# Patient Record
Sex: Male | Born: 1955 | Race: White | Hispanic: No | Marital: Married | Smoking: Former smoker
Health system: Southern US, Community
[De-identification: ages and names within clinical notes are randomized; demographics above are authoritative.]

## PROBLEM LIST (undated history)

## (undated) DIAGNOSIS — I255 Ischemic cardiomyopathy: Secondary | ICD-10-CM

## (undated) DIAGNOSIS — I071 Rheumatic tricuspid insufficiency: Secondary | ICD-10-CM

## (undated) DIAGNOSIS — I2510 Atherosclerotic heart disease of native coronary artery without angina pectoris: Secondary | ICD-10-CM

## (undated) DIAGNOSIS — N200 Calculus of kidney: Secondary | ICD-10-CM

## (undated) DIAGNOSIS — R361 Hematospermia: Secondary | ICD-10-CM

## (undated) DIAGNOSIS — A879 Viral meningitis, unspecified: Secondary | ICD-10-CM

## (undated) DIAGNOSIS — I10 Essential (primary) hypertension: Secondary | ICD-10-CM

## (undated) DIAGNOSIS — I509 Heart failure, unspecified: Secondary | ICD-10-CM

## (undated) DIAGNOSIS — I340 Nonrheumatic mitral (valve) insufficiency: Secondary | ICD-10-CM

## (undated) DIAGNOSIS — I739 Peripheral vascular disease, unspecified: Secondary | ICD-10-CM

## (undated) DIAGNOSIS — I5022 Chronic systolic (congestive) heart failure: Secondary | ICD-10-CM

## (undated) HISTORY — DX: Hematospermia: R36.1

## (undated) HISTORY — DX: Calculus of kidney: N20.0

## (undated) HISTORY — DX: Heart failure, unspecified: I50.9

## (undated) HISTORY — DX: Viral meningitis, unspecified: A87.9

## (undated) HISTORY — DX: Essential (primary) hypertension: I10

---

## 2000-04-10 ENCOUNTER — Encounter: Payer: Self-pay | Admitting: Family Medicine

## 2002-04-07 ENCOUNTER — Emergency Department (HOSPITAL_COMMUNITY): Admission: EM | Admit: 2002-04-07 | Discharge: 2002-04-07 | Payer: Self-pay | Admitting: *Deleted

## 2005-01-16 ENCOUNTER — Ambulatory Visit: Payer: Self-pay | Admitting: Family Medicine

## 2007-02-07 DIAGNOSIS — N200 Calculus of kidney: Secondary | ICD-10-CM

## 2007-02-07 HISTORY — DX: Calculus of kidney: N20.0

## 2007-02-22 DIAGNOSIS — I10 Essential (primary) hypertension: Secondary | ICD-10-CM

## 2007-02-22 DIAGNOSIS — E119 Type 2 diabetes mellitus without complications: Secondary | ICD-10-CM

## 2007-03-22 ENCOUNTER — Ambulatory Visit: Payer: Self-pay | Admitting: Family Medicine

## 2007-03-22 DIAGNOSIS — Z87442 Personal history of urinary calculi: Secondary | ICD-10-CM | POA: Insufficient documentation

## 2007-03-22 LAB — CONVERTED CEMR LAB: Glucose, Bld: 111 mg/dL

## 2007-03-26 LAB — CONVERTED CEMR LAB
ALT: 58 units/L — ABNORMAL HIGH (ref 0–53)
AST: 45 units/L — ABNORMAL HIGH (ref 0–37)
Albumin: 4.2 g/dL (ref 3.5–5.2)
Alkaline Phosphatase: 79 units/L (ref 39–117)
BUN: 10 mg/dL (ref 6–23)
Basophils Absolute: 0 10*3/uL (ref 0.0–0.1)
Basophils Relative: 0.2 % (ref 0.0–1.0)
Bilirubin, Direct: 0.3 mg/dL (ref 0.0–0.3)
CO2: 30 meq/L (ref 19–32)
Calcium: 9.7 mg/dL (ref 8.4–10.5)
Chloride: 103 meq/L (ref 96–112)
Cholesterol: 162 mg/dL (ref 0–200)
Creatinine, Ser: 0.7 mg/dL (ref 0.4–1.5)
Eosinophils Absolute: 0.3 10*3/uL (ref 0.0–0.6)
Eosinophils Relative: 4.6 % (ref 0.0–5.0)
GFR calc Af Amer: 154 mL/min
GFR calc non Af Amer: 127 mL/min
Glucose, Bld: 117 mg/dL — ABNORMAL HIGH (ref 70–99)
HCT: 40.9 % (ref 39.0–52.0)
HDL: 34.5 mg/dL — ABNORMAL LOW (ref >39.0)
Hemoglobin: 14.3 g/dL (ref 13.0–17.0)
Hgb A1c MFr Bld: 9 % — ABNORMAL HIGH (ref 4.6–6.0)
LDL Cholesterol: 97 mg/dL (ref 0–99)
Lymphocytes Relative: 31.8 % (ref 12.0–46.0)
MCHC: 34.9 g/dL (ref 30.0–36.0)
MCV: 92.8 fL (ref 78.0–100.0)
Monocytes Absolute: 0.5 10*3/uL (ref 0.2–0.7)
Monocytes Relative: 7 % (ref 3.0–11.0)
Neutro Abs: 3.7 10*3/uL (ref 1.4–7.7)
Neutrophils Relative %: 56.4 % (ref 43.0–77.0)
Platelets: 136 10*3/uL — ABNORMAL LOW (ref 150–400)
Potassium: 4.4 meq/L (ref 3.5–5.1)
RBC: 4.4 M/uL (ref 4.22–5.81)
RDW: 13.4 % (ref 11.5–14.6)
Sodium: 139 meq/L (ref 135–145)
TSH: 3.68 microintl units/mL (ref 0.35–5.50)
Total Bilirubin: 1.7 mg/dL — ABNORMAL HIGH (ref 0.3–1.2)
Total CHOL/HDL Ratio: 4.7
Total Protein: 7.5 g/dL (ref 6.0–8.3)
Triglycerides: 152 mg/dL — ABNORMAL HIGH (ref 0–149)
VLDL: 30 mg/dL (ref 0–40)
WBC: 6.6 10*3/uL (ref 4.5–10.5)

## 2007-05-10 ENCOUNTER — Telehealth: Payer: Self-pay | Admitting: Family Medicine

## 2008-07-07 ENCOUNTER — Telehealth: Payer: Self-pay | Admitting: Family Medicine

## 2008-07-21 ENCOUNTER — Ambulatory Visit: Payer: Self-pay | Admitting: Family Medicine

## 2008-07-21 LAB — CONVERTED CEMR LAB
Bilirubin Urine: NEGATIVE
Blood in Urine, dipstick: NEGATIVE
Ketones, urine, test strip: NEGATIVE
Nitrite: NEGATIVE
Protein, U semiquant: NEGATIVE
Specific Gravity, Urine: 1.025
Urobilinogen, UA: 0.2
WBC Urine, dipstick: NEGATIVE
pH: 5

## 2008-07-27 LAB — CONVERTED CEMR LAB
ALT: 44 units/L (ref 0–53)
AST: 36 units/L (ref 0–37)
Albumin: 3.8 g/dL (ref 3.5–5.2)
Alkaline Phosphatase: 65 units/L (ref 39–117)
BUN: 13 mg/dL (ref 6–23)
Basophils Absolute: 0.1 10*3/uL (ref 0.0–0.1)
Basophils Relative: 0.9 % (ref 0.0–3.0)
Bilirubin, Direct: 0.2 mg/dL (ref 0.0–0.3)
CO2: 29 meq/L (ref 19–32)
Calcium: 9.5 mg/dL (ref 8.4–10.5)
Chloride: 110 meq/L (ref 96–112)
Cholesterol: 132 mg/dL (ref 0–200)
Creatinine, Ser: 0.7 mg/dL (ref 0.4–1.5)
Creatinine,U: 156.5 mg/dL
Eosinophils Absolute: 0.3 10*3/uL (ref 0.0–0.7)
Eosinophils Relative: 4.3 % (ref 0.0–5.0)
GFR calc Af Amer: 154 mL/min
GFR calc non Af Amer: 127 mL/min
Glucose, Bld: 180 mg/dL — ABNORMAL HIGH (ref 70–99)
HCT: 42.5 % (ref 39.0–52.0)
HDL: 36.5 mg/dL — ABNORMAL LOW (ref >39.0)
Hemoglobin: 14.7 g/dL (ref 13.0–17.0)
Hgb A1c MFr Bld: 9.7 % — ABNORMAL HIGH (ref 4.6–6.0)
LDL Cholesterol: 72 mg/dL (ref 0–99)
Lymphocytes Relative: 29.7 % (ref 12.0–46.0)
MCHC: 34.6 g/dL (ref 30.0–36.0)
MCV: 93.5 fL (ref 78.0–100.0)
Microalb Creat Ratio: 14.1 mg/g (ref 0.0–30.0)
Microalb, Ur: 2.2 mg/dL — ABNORMAL HIGH (ref 0.0–1.9)
Monocytes Absolute: 0.5 10*3/uL (ref 0.1–1.0)
Monocytes Relative: 7.7 % (ref 3.0–12.0)
Neutro Abs: 3.3 10*3/uL (ref 1.4–7.7)
Neutrophils Relative %: 57.4 % (ref 43.0–77.0)
PSA: 0.42 ng/mL (ref 0.10–4.00)
Platelets: 122 10*3/uL — ABNORMAL LOW (ref 150–400)
Potassium: 4.8 meq/L (ref 3.5–5.1)
RBC: 4.55 M/uL (ref 4.22–5.81)
RDW: 13.3 % (ref 11.5–14.6)
Sodium: 144 meq/L (ref 135–145)
TSH: 3.33 microintl units/mL (ref 0.35–5.50)
Total Bilirubin: 1.4 mg/dL — ABNORMAL HIGH (ref 0.3–1.2)
Total CHOL/HDL Ratio: 3.6
Total Protein: 7 g/dL (ref 6.0–8.3)
Triglycerides: 117 mg/dL (ref 0–149)
VLDL: 23 mg/dL (ref 0–40)
WBC: 6 10*3/uL (ref 4.5–10.5)

## 2008-08-04 ENCOUNTER — Ambulatory Visit: Payer: Self-pay | Admitting: Family Medicine

## 2008-10-26 ENCOUNTER — Ambulatory Visit: Payer: Self-pay | Admitting: Family Medicine

## 2008-10-28 LAB — CONVERTED CEMR LAB: Hgb A1c MFr Bld: 5.7 % (ref 4.6–6.5)

## 2008-11-02 ENCOUNTER — Ambulatory Visit: Payer: Self-pay | Admitting: Family Medicine

## 2008-11-09 ENCOUNTER — Telehealth: Payer: Self-pay | Admitting: Family Medicine

## 2008-11-17 ENCOUNTER — Telehealth (INDEPENDENT_AMBULATORY_CARE_PROVIDER_SITE_OTHER): Payer: Self-pay | Admitting: *Deleted

## 2009-06-21 ENCOUNTER — Telehealth: Payer: Self-pay | Admitting: Family Medicine

## 2010-06-22 ENCOUNTER — Other Ambulatory Visit: Payer: Self-pay | Admitting: Family Medicine

## 2010-06-22 ENCOUNTER — Ambulatory Visit
Admission: RE | Admit: 2010-06-22 | Discharge: 2010-06-22 | Payer: Self-pay | Source: Home / Self Care | Attending: Family Medicine | Admitting: Family Medicine

## 2010-06-22 DIAGNOSIS — F909 Attention-deficit hyperactivity disorder, unspecified type: Secondary | ICD-10-CM | POA: Insufficient documentation

## 2010-06-22 DIAGNOSIS — N529 Male erectile dysfunction, unspecified: Secondary | ICD-10-CM | POA: Insufficient documentation

## 2010-06-22 LAB — CBC WITH DIFFERENTIAL/PLATELET
Basophils Absolute: 0 10*3/uL (ref 0.0–0.1)
Basophils Relative: 0.5 % (ref 0.0–3.0)
Eosinophils Absolute: 0.2 10*3/uL (ref 0.0–0.7)
Eosinophils Relative: 3 % (ref 0.0–5.0)
HCT: 47.5 % (ref 39.0–52.0)
Hemoglobin: 16.1 g/dL (ref 13.0–17.0)
Lymphocytes Relative: 25.1 % (ref 12.0–46.0)
Lymphs Abs: 1.9 10*3/uL (ref 0.7–4.0)
MCHC: 33.9 g/dL (ref 30.0–36.0)
MCV: 95.2 fl (ref 78.0–100.0)
Monocytes Absolute: 0.6 10*3/uL (ref 0.1–1.0)
Monocytes Relative: 7.2 % (ref 3.0–12.0)
Neutro Abs: 4.9 10*3/uL (ref 1.4–7.7)
Neutrophils Relative %: 64.2 % (ref 43.0–77.0)
Platelets: 149 10*3/uL — ABNORMAL LOW (ref 150.0–400.0)
RBC: 4.99 Mil/uL (ref 4.22–5.81)
RDW: 13.5 % (ref 11.5–14.6)
WBC: 7.6 10*3/uL (ref 4.5–10.5)

## 2010-06-22 LAB — BASIC METABOLIC PANEL
BUN: 16 mg/dL (ref 6–23)
CO2: 27 mEq/L (ref 19–32)
Calcium: 9.7 mg/dL (ref 8.4–10.5)
Chloride: 99 mEq/L (ref 96–112)
Creatinine, Ser: 0.6 mg/dL (ref 0.4–1.5)
GFR: 159.23 mL/min (ref >60.00)
Glucose, Bld: 249 mg/dL — ABNORMAL HIGH (ref 70–99)
Potassium: 5.3 mEq/L — ABNORMAL HIGH (ref 3.5–5.1)
Sodium: 134 mEq/L — ABNORMAL LOW (ref 135–145)

## 2010-06-22 LAB — LIPID PANEL
Cholesterol: 124 mg/dL (ref 0–200)
HDL: 31.1 mg/dL — ABNORMAL LOW (ref >39.00)
LDL Cholesterol: 67 mg/dL (ref 0–99)
Total CHOL/HDL Ratio: 4
Triglycerides: 130 mg/dL (ref 0.0–149.0)
VLDL: 26 mg/dL (ref 0.0–40.0)

## 2010-06-22 LAB — HEPATIC FUNCTION PANEL
ALT: 44 U/L (ref 0–53)
AST: 31 U/L (ref 0–37)
Albumin: 4.2 g/dL (ref 3.5–5.2)
Alkaline Phosphatase: 76 U/L (ref 39–117)
Bilirubin, Direct: 0.3 mg/dL (ref 0.0–0.3)
Total Bilirubin: 2.1 mg/dL — ABNORMAL HIGH (ref 0.3–1.2)
Total Protein: 7.6 g/dL (ref 6.0–8.3)

## 2010-06-22 LAB — HEMOGLOBIN A1C: Hgb A1c MFr Bld: 9.9 % — ABNORMAL HIGH (ref 4.6–6.5)

## 2010-06-22 LAB — TSH: TSH: 5.27 u[IU]/mL (ref 0.35–5.50)

## 2010-07-18 ENCOUNTER — Telehealth: Payer: Self-pay | Admitting: Family Medicine

## 2010-07-19 NOTE — Progress Notes (Signed)
Summary: new rx  Phone Note Call from Patient Call back at [phone]   Caller: Patient Call For: Nelwyn Salisbury MD Summary of Call: pt would like a rx for anxiety, he is going through a stressful time Initial call taken by: Alfred Levins, CMA,  June 21, 2009 3:20 PM  Follow-up for Phone Call        needs an OV Follow-up by: Nelwyn Salisbury MD,  June 23, 2009 3:12 PM  Additional Follow-up for Phone Call Additional follow up Details #1::        Called pt on # listed: [phone] to schedule OV... He adv that he will c/b when he has his calendar with him... Gave pt # to [facility] and my ext for return call.  Additional Follow-up by: Debbra Riding,  June 23, 2009 4:48 PM    Additional Follow-up for Phone Call Additional follow up Details #2::    Phone Call Completed------Called pt on # listed: [phone]...Marland KitchenMarland Kitchen Pt adv that he will make contact with [facility] when he is ready to schedule appt w/ Dr Clent Ridges...Marland KitchenMarland Kitchen Pt has # to [facility] as well as my ext for return call. Follow-up by: Debbra Riding,  June 24, 2009 11:16 AM

## 2010-07-21 NOTE — Assessment & Plan Note (Signed)
Summary: fu on dm/njr rsc bmp/njr   Vital Signs:  Patient profile:   55 year old male Weight:      253 pounds O2 Sat:      98 % on Room air Temp:     99 degrees F Pulse rate:   80 / minute BP sitting:   142 / 78  (left arm) Cuff size:   large  Vitals Entered By: Pura Spice, RN (June 22, 2010 8:31 AM)  O2 Flow:  Room air CC: follow up diabetes states has not checked his blood sugar months   History of Present Illness: Here for follow up on multiple problems. In the past year he has slipped on his diet, he has not exercised, and he has put on weight. He does not check his own glucoses. He had tried some ADHD meds a few years ago with good results, and he want to try this again. He has trouble focusing at work, and this involves working on several projects simultaneously. he wants to try a med for erections.   Allergies (verified): No Known Drug Allergies  Past History:  Past Medical History: Reviewed history from 08/04/2008 and no changes required. Diabetes mellitus, type II Hypertension Viral Meningitis Hematospermia Nephrolithiasis, hx of (02-07-07)  Review of Systems  The patient denies anorexia, fever, weight loss, vision loss, decreased hearing, hoarseness, chest pain, syncope, dyspnea on exertion, peripheral edema, prolonged cough, headaches, hemoptysis, abdominal pain, melena, hematochezia, severe indigestion/heartburn, hematuria, incontinence, genital sores, muscle weakness, suspicious skin lesions, transient blindness, difficulty walking, depression, unusual weight change, abnormal bleeding, enlarged lymph nodes, angioedema, breast masses, and testicular masses.    Physical Exam  General:  overweight-appearing.   Neck:  No deformities, masses, or tenderness noted. Lungs:  Normal respiratory effort, chest expands symmetrically. Lungs are clear to auscultation, no crackles or wheezes. Heart:  Normal rate and regular rhythm. S1 and S2 normal without gallop,  murmur, click, rub or other extra sounds.   Impression & Recommendations:  Problem # 1:  HYPERTENSION (ICD-401.9)  His updated medication list for this problem includes:    Vasotec 10 Mg Tabs (Enalapril maleate) .Marland Kitchen... 1 by mouth once daily  Problem # 2:  DIABETES MELLITUS, TYPE II (ICD-250.00)  The following medications were removed from the medication list:    Metformin Hcl 500 Mg Tabs (Metformin hcl) ..... One  tab by mouth two times a day His updated medication list for this problem includes:    Vasotec 10 Mg Tabs (Enalapril maleate) .Marland Kitchen... 1 by mouth once daily    Amaryl 4 Mg Tabs (Glimepiride) .Marland Kitchen... 1 by mouth two times a day    Metformin Hcl 1000 Mg Tabs (Metformin hcl) .Marland Kitchen..Marland Kitchen Two times a day  Orders: UA Dipstick w/o Micro (automated)  (81003) Venipuncture (04540) TLB-Lipid Panel (80061-LIPID) TLB-BMP (Basic Metabolic Panel-BMET) (80048-METABOL) TLB-CBC Platelet - w/Differential (85025-CBCD) TLB-Hepatic/Liver Function Pnl (80076-HEPATIC) TLB-TSH (Thyroid Stimulating Hormone) (84443-TSH) TLB-A1C / Hgb A1C (Glycohemoglobin) (83036-A1C)  Problem # 3:  ADHD (ICD-314.01)  Problem # 4:  ERECTILE DYSFUNCTION, ORGANIC (ICD-607.84)  His updated medication list for this problem includes:    Cialis 20 Mg Tabs (Tadalafil) .Marland Kitchen... As needed  Complete Medication List: 1)  Vasotec 10 Mg Tabs (Enalapril maleate) .Marland Kitchen.. 1 by mouth once daily 2)  Amaryl 4 Mg Tabs (Glimepiride) .Marland Kitchen.. 1 by mouth two times a day 3)  Accu-chek Aviva Strp (Glucose blood) .... Once daily 4)  Metformin Hcl 1000 Mg Tabs (Metformin hcl) .... Two times a day  5)  Cialis 20 Mg Tabs (Tadalafil) .... As needed 6)  Ritalin La 20 Mg Xr24h-cap (Methylphenidate hcl) .... Once daily  Patient Instructions: 1)  It is important that you exercise reguarly at least 20 minutes 5 times a week. If you develop chest pain, have severe difficulty breathing, or feel very tired, stop exercising immediately and seek medical attention.  2)   You need to lose weight. Consider a lower calorie diet and regular exercise.  3)  get labs  Prescriptions: VASOTEC 10 MG  TABS (ENALAPRIL MALEATE) 1 by mouth once daily  #30 x 0   Entered and Authorized by:   Nelwyn Salisbury MD   Signed by:   Nelwyn Salisbury MD on 06/22/2010   Method used:   Electronically to        [facility] [phone]* (retail)       [address]       [place], [place]  [address]       Ph: [phone]       Fax: [phone]   RxID:   [id number] RITALIN LA 20 MG XR24H-CAP (METHYLPHENIDATE HCL) once daily  #30 x 0   Entered and Authorized by:   Nelwyn Salisbury MD   Signed by:   Nelwyn Salisbury MD on 06/22/2010   Method used:   Print then Give to Patient   RxID:   [id number] CIALIS 20 MG TABS (TADALAFIL) as needed  #10 x 11   Entered and Authorized by:   Nelwyn Salisbury MD   Signed by:   Nelwyn Salisbury MD on 06/22/2010   Method used:   Print then Give to Patient   RxID:   [id number] METFORMIN HCL 1000 MG TABS (METFORMIN HCL) two times a day  #180 x 3   Entered and Authorized by:   Nelwyn Salisbury MD   Signed by:   Nelwyn Salisbury MD on 06/22/2010   Method used:   Print then Give to Patient   RxID:   [id number] AMARYL 4 MG  TABS (GLIMEPIRIDE) 1 by mouth two times a day  #180 x 3   Entered and Authorized by:   Nelwyn Salisbury MD   Signed by:   Nelwyn Salisbury MD on 06/22/2010   Method used:   Print then Give to Patient   RxID:   [id number] VASOTEC 10 MG  TABS (ENALAPRIL MALEATE) 1 by mouth once daily  #90 x 3   Entered and Authorized by:   Nelwyn Salisbury MD   Signed by:   Nelwyn Salisbury MD on 06/22/2010   Method used:   Print then Give to Patient   RxID:   [id number]    Orders Added: 1)  Est. Patient Level IV [76160] 2)  UA Dipstick w/o Micro (automated)  [81003] 3)  Venipuncture [73710] 4)  TLB-Lipid Panel [80061-LIPID] 5)  TLB-BMP (Basic Metabolic Panel-BMET) [80048-METABOL] 6)  TLB-CBC Platelet - w/Differential [85025-CBCD] 7)   TLB-Hepatic/Liver Function Pnl [80076-HEPATIC] 8)  TLB-TSH (Thyroid Stimulating Hormone) [84443-TSH] 9)  TLB-A1C / Hgb A1C (Glycohemoglobin) [83036-A1C]  Appended Document: Orders Update    Clinical Lists Changes  Orders: Added new Service order of Specimen Handling (62694) - Signed      Appended Document: fu on dm/njr rsc bmp/njr  Laboratory Results   Urine Tests    Routine Urinalysis   Color: yellow Appearance: Clear Glucose: 2+   (Normal Range: Negative) Bilirubin: negative   (Normal Range: Negative) Ketone: trace (  5)   (Normal Range: Negative) Spec. Gravity: 1.020   (Normal Range: 1.003-1.035) Blood: negative   (Normal Range: Negative) pH: 5.0   (Normal Range: 5.0-8.0) Protein: negative   (Normal Range: Negative) Urobilinogen: 0.2   (Normal Range: 0-1) Nitrite: negative   (Normal Range: Negative) Leukocyte Esterace: negative   (Normal Range: Negative)    Comments: Rita Ohara  June 22, 2010 10:56 AM

## 2010-07-27 NOTE — Progress Notes (Signed)
Summary: rx   Phone Note Call from Patient   Caller: Patient Call For: Nelwyn Salisbury MD Summary of Call: Pt is asking for Ritalin generic at [facility]. [phone] Initial call taken by: Lynann Beaver CMA AAMA,  July 18, 2010 11:58 AM  Follow-up for Phone Call        done Follow-up by: Nelwyn Salisbury MD,  July 18, 2010 1:10 PM  Additional Follow-up for Phone Call Additional follow up Details #1::        pt notified  Additional Follow-up by: Pura Spice, RN,  July 18, 2010 1:19 PM    New/Updated Medications: RITALIN 20 MG TABS (METHYLPHENIDATE HCL) once daily Prescriptions: RITALIN 20 MG TABS (METHYLPHENIDATE HCL) once daily  #30 x 0   Entered and Authorized by:   Nelwyn Salisbury MD   Signed by:   Nelwyn Salisbury MD on 07/18/2010   Method used:   Print then Give to Patient   RxID:   [id number]

## 2010-08-12 ENCOUNTER — Other Ambulatory Visit: Payer: Self-pay | Admitting: Family Medicine

## 2010-08-12 MED ORDER — METHYLPHENIDATE HCL ER (LA) 30 MG PO CP24: 30.0000 mg | ORAL_CAPSULE | Freq: Every day | ORAL | Status: DC

## 2010-08-12 NOTE — Telephone Encounter (Signed)
Triage vm---pt requesting a 30-day sample of Ritalin 30mg . Please call when ready.

## 2010-08-12 NOTE — Telephone Encounter (Signed)
Left mess  On phone rx ready for pick up

## 2010-08-12 NOTE — Telephone Encounter (Signed)
done

## 2010-09-05 ENCOUNTER — Other Ambulatory Visit: Payer: Self-pay

## 2010-09-05 NOTE — Telephone Encounter (Signed)
Wants rx for ritalin 30mg  [facility]  [phone]  Pt will pick up

## 2010-09-08 MED ORDER — METHYLPHENIDATE HCL ER (LA) 30 MG PO CP24: 30.0000 mg | ORAL_CAPSULE | Freq: Every day | ORAL | Status: DC

## 2010-09-08 NOTE — Telephone Encounter (Signed)
Mess left rx ready

## 2010-09-08 NOTE — Telephone Encounter (Signed)
done

## 2010-10-03 ENCOUNTER — Other Ambulatory Visit (INDEPENDENT_AMBULATORY_CARE_PROVIDER_SITE_OTHER): Payer: BLUE CROSS/BLUE SHIELD | Admitting: Family Medicine

## 2010-10-03 DIAGNOSIS — E119 Type 2 diabetes mellitus without complications: Secondary | ICD-10-CM

## 2010-10-03 LAB — HEMOGLOBIN A1C: Hgb A1c MFr Bld: 7.9 % — ABNORMAL HIGH (ref 4.6–6.5)

## 2010-10-04 ENCOUNTER — Telehealth: Payer: Self-pay

## 2010-10-04 NOTE — Telephone Encounter (Signed)
Pt aware.

## 2010-10-04 NOTE — Telephone Encounter (Signed)
Message copied by Madison Hickman on Tue Oct 04, 2010 10:14 AM ------      Message from: Dwaine Deter      Created: Tue Oct 04, 2010  5:37 AM       Diabetes is not as well controlled as it should be. Watch a stricter diet

## 2010-11-04 NOTE — Assessment & Plan Note (Signed)
[facility]                                 ON-CALL NOTE   NAME:Hietala, Meghan W.                        MRN:          [id number]  DATE:02/06/2007                            DOB:          1955/08/19    TIME OF CALL:  10:43 p.m.   PHONE NUMBER:  [phone]   PRIMARY CARE PHYSICIAN:  Tera Mater. Clent Ridges, MD   Mrs. Nocera is the caller, but I spoke to [place] himself.   CHIEF COMPLAINT:  Is abdominal pain.   The patient says that he had been bouncing a grandchild on his knee and  thought he strained something and now he is having some focal pain in  his left lower abdomen. It is very dull and achy. He feels a little  bloated as well. He thinks he may need to have a bowel movement, but is  not sure. He has no fever, nausea or vomiting or radiation of the pain.  He does not have any history of diverticulitis or any other colon  problems.   I told him that if his pain becomes severe tonight or if he develops  fever or other symptoms he should go to the emergency room for  evaluation. Otherwise, he will apply some gentle heat to the area and  try some Tylenol and call the office for followup first thing in the  morning.     Marne A. Tower, MD  Electronically Signed    MAT/MedQ  DD: 02/07/2007  DT: 02/07/2007  Job #: [id number]   cc:   Jeannett Senior A. Clent Ridges, MD

## 2011-02-21 ENCOUNTER — Telehealth: Payer: Self-pay | Admitting: Family Medicine

## 2011-02-21 NOTE — Telephone Encounter (Signed)
Refill request for Ritalin LA 30 mg 24 hr take 1 po qd. Pt last here on 06/22/10 and script last filled on 09/08/10.

## 2011-02-22 MED ORDER — METHYLPHENIDATE HCL ER (LA) 30 MG PO CP24: 30.0000 mg | ORAL_CAPSULE | Freq: Every day | ORAL | Status: DC

## 2011-02-22 NOTE — Telephone Encounter (Signed)
Done

## 2011-02-22 NOTE — Telephone Encounter (Signed)
Left voice message, script is ready for pick up. 

## 2011-07-27 ENCOUNTER — Other Ambulatory Visit: Payer: Self-pay | Admitting: Family Medicine

## 2011-07-31 NOTE — Telephone Encounter (Signed)
Can we refill? 

## 2011-08-01 NOTE — Telephone Encounter (Signed)
Call in one month of each. He needs an OV for any after that

## 2011-08-02 NOTE — Telephone Encounter (Signed)
done

## 2011-08-03 ENCOUNTER — Other Ambulatory Visit: Payer: Self-pay

## 2011-08-03 MED ORDER — METFORMIN HCL 1000 MG PO TABS: 1000.0000 mg | ORAL_TABLET | Freq: Two times a day (BID) | ORAL | Status: DC

## 2011-08-03 MED ORDER — ENALAPRIL MALEATE 10 MG PO TABS: 10.0000 mg | ORAL_TABLET | Freq: Every day | ORAL | Status: DC

## 2011-08-03 MED ORDER — GLIMEPIRIDE 4 MG PO TABS: 4.0000 mg | ORAL_TABLET | Freq: Two times a day (BID) | ORAL | Status: DC

## 2011-08-03 NOTE — Telephone Encounter (Signed)
Pt called regarding his rx request through [facility].  Rx were sent 08/02/11 to [facility].  Rx resent to [facility].  Pt aware rx have been sent.

## 2012-01-10 ENCOUNTER — Other Ambulatory Visit: Payer: Self-pay | Admitting: Family Medicine

## 2012-01-10 NOTE — Telephone Encounter (Signed)
Can we refill these? 

## 2012-01-15 ENCOUNTER — Encounter: Payer: Self-pay | Admitting: Family Medicine

## 2012-01-17 ENCOUNTER — Encounter: Payer: Self-pay | Admitting: Family Medicine

## 2012-01-17 ENCOUNTER — Ambulatory Visit (INDEPENDENT_AMBULATORY_CARE_PROVIDER_SITE_OTHER): Payer: BLUE CROSS/BLUE SHIELD | Admitting: Family Medicine

## 2012-01-17 VITALS — BP 140/76 | HR 108 | Temp 98.9°F | Wt 233.0 lb

## 2012-01-17 DIAGNOSIS — E119 Type 2 diabetes mellitus without complications: Secondary | ICD-10-CM

## 2012-01-17 DIAGNOSIS — I10 Essential (primary) hypertension: Secondary | ICD-10-CM

## 2012-01-17 MED ORDER — ACCU-CHEK SOFT TOUCH LANCETS MISC: Status: DC

## 2012-01-17 MED ORDER — TADALAFIL 20 MG PO TABS: 10.0000 mg | ORAL_TABLET | Freq: Every day | ORAL | Status: DC | PRN

## 2012-01-17 MED ORDER — METHYLPHENIDATE HCL ER (LA) 30 MG PO CP24: 30.0000 mg | ORAL_CAPSULE | Freq: Every day | ORAL | Status: DC

## 2012-01-17 MED ORDER — ENALAPRIL MALEATE 10 MG PO TABS: 10.0000 mg | ORAL_TABLET | Freq: Every day | ORAL | Status: DC

## 2012-01-17 MED ORDER — GLIMEPIRIDE 4 MG PO TABS: 4.0000 mg | ORAL_TABLET | Freq: Two times a day (BID) | ORAL | Status: DC

## 2012-01-17 MED ORDER — METFORMIN HCL 1000 MG PO TABS: 1000.0000 mg | ORAL_TABLET | Freq: Two times a day (BID) | ORAL | Status: DC

## 2012-01-17 MED ORDER — GLUCOSE BLOOD VI STRP: ORAL_STRIP | Status: DC

## 2012-01-17 NOTE — Progress Notes (Signed)
  Subjective:    Patient ID: [name], male    DOB: 1955-08-22, 55 y.o.   MRN: [id number]  HPI Here to follow up on issues after not being here for 18 months. He ran of meds for a month, and now has been on them for a week. His glucoses went up to the 400s during this month, and now they are back to the 150-170 range. He feels well.    Review of Systems  Constitutional: Negative.   Respiratory: Negative.   Cardiovascular: Negative.        Objective:   Physical Exam  Constitutional: He appears well-developed and well-nourished.  Cardiovascular: Normal rate, regular rhythm, normal heart sounds and intact distal pulses.   Pulmonary/Chest: Effort normal and breath sounds normal.          Assessment & Plan:  Refilled meds. Plan on getting fasting labs in one month

## 2012-02-23 ENCOUNTER — Other Ambulatory Visit (INDEPENDENT_AMBULATORY_CARE_PROVIDER_SITE_OTHER): Payer: BLUE CROSS/BLUE SHIELD

## 2012-02-23 DIAGNOSIS — E119 Type 2 diabetes mellitus without complications: Secondary | ICD-10-CM

## 2012-02-23 LAB — CBC WITH DIFFERENTIAL/PLATELET
Basophils Absolute: 0 10*3/uL (ref 0.0–0.1)
HCT: 42.1 % (ref 39.0–52.0)
Lymphocytes Relative: 26.6 % (ref 12.0–46.0)
Lymphs Abs: 1.5 10*3/uL (ref 0.7–4.0)
Monocytes Relative: 7.9 % (ref 3.0–12.0)
Neutrophils Relative %: 61.9 % (ref 43.0–77.0)
Platelets: 143 10*3/uL — ABNORMAL LOW (ref 150.0–400.0)
RDW: 13.8 % (ref 11.5–14.6)
WBC: 5.7 10*3/uL (ref 4.5–10.5)

## 2012-02-23 LAB — HEPATIC FUNCTION PANEL
AST: 32 U/L (ref 0–37)
Alkaline Phosphatase: 67 U/L (ref 39–117)
Bilirubin, Direct: 0.2 mg/dL (ref 0.0–0.3)
Total Bilirubin: 1.3 mg/dL — ABNORMAL HIGH (ref 0.3–1.2)

## 2012-02-23 LAB — LIPID PANEL
LDL Cholesterol: 58 mg/dL (ref 0–99)
VLDL: 22 mg/dL (ref 0.0–40.0)

## 2012-02-23 LAB — HEMOGLOBIN A1C: Hgb A1c MFr Bld: 8.1 % — ABNORMAL HIGH (ref 4.6–6.5)

## 2012-02-23 LAB — BASIC METABOLIC PANEL
GFR: 140.96 mL/min (ref >60.00)
Potassium: 4 mEq/L (ref 3.5–5.1)
Sodium: 139 mEq/L (ref 135–145)

## 2012-02-29 NOTE — Progress Notes (Signed)
Quick Note:  I spoke with pt ______ 

## 2012-03-01 ENCOUNTER — Encounter: Payer: Self-pay | Admitting: Family Medicine

## 2012-03-01 ENCOUNTER — Ambulatory Visit (INDEPENDENT_AMBULATORY_CARE_PROVIDER_SITE_OTHER): Payer: BLUE CROSS/BLUE SHIELD | Admitting: Family Medicine

## 2012-03-01 VITALS — BP 130/78 | HR 90 | Temp 98.5°F | Wt 232.0 lb

## 2012-03-01 DIAGNOSIS — E119 Type 2 diabetes mellitus without complications: Secondary | ICD-10-CM

## 2012-03-01 DIAGNOSIS — I10 Essential (primary) hypertension: Secondary | ICD-10-CM

## 2012-03-01 MED ORDER — METHYLPHENIDATE HCL ER (LA) 30 MG PO CP24: 30.0000 mg | ORAL_CAPSULE | Freq: Every day | ORAL | Status: DC

## 2012-03-01 MED ORDER — SITAGLIPTIN PHOSPHATE 100 MG PO TABS: 100.0000 mg | ORAL_TABLET | Freq: Every day | ORAL | Status: DC

## 2012-03-01 NOTE — Progress Notes (Signed)
  Subjective:    Patient ID: [name], male    DOB: 1955-08-28, 56 y.o.   MRN: [id number]  HPI Here to follow up on diabetes. He has been back on his regular medications and he is watching the diet closely. He feels fine. He says his am fasting glucoses at home have been 100-105, that his 2 hour PP glucoses have been 110-120, and that the highest glucose he has had anytime has been 150. However on his recent labs here the fasting glucose was 194 and the A1c was 8.1.    Review of Systems  Constitutional: Negative.   Respiratory: Negative.   Cardiovascular: Negative.        Objective:   Physical Exam  Constitutional: He appears well-developed and well-nourished.  Cardiovascular: Normal rate, regular rhythm, normal heart sounds and intact distal pulses.   Pulmonary/Chest: Effort normal and breath sounds normal.          Assessment & Plan:  His diabetes is not well controlled, and I think his gluocometer is not accurate. I advised him to get a new one, and he agreed. We will add Januvia 100 mg a day. Recheck in 90 days

## 2012-05-24 ENCOUNTER — Telehealth: Payer: Self-pay | Admitting: Family Medicine

## 2012-05-24 NOTE — Telephone Encounter (Signed)
Pt requested a refill on lancets & strips and send to [facility] ( one touch ultra mini blue )

## 2012-05-28 MED ORDER — GLUCOSE BLOOD VI STRP: ORAL_STRIP | Status: DC

## 2012-05-28 MED ORDER — ONETOUCH ULTRASOFT LANCETS MISC: Status: DC

## 2012-05-28 MED ORDER — ACCU-CHEK SOFT TOUCH LANCETS MISC: Status: AC

## 2012-05-28 NOTE — Telephone Encounter (Signed)
I sent scripts e-scribe. 

## 2012-11-30 ENCOUNTER — Other Ambulatory Visit: Payer: Self-pay | Admitting: Family Medicine

## 2013-02-05 ENCOUNTER — Encounter (INDEPENDENT_AMBULATORY_CARE_PROVIDER_SITE_OTHER): Payer: Self-pay | Admitting: General Surgery

## 2013-02-05 ENCOUNTER — Ambulatory Visit (INDEPENDENT_AMBULATORY_CARE_PROVIDER_SITE_OTHER): Payer: BC Managed Care – PPO | Admitting: General Surgery

## 2013-02-05 VITALS — BP 126/76 | HR 97 | Temp 98.0°F | Resp 18 | Ht 68.0 in | Wt 228.0 lb

## 2013-02-05 DIAGNOSIS — R21 Rash and other nonspecific skin eruption: Secondary | ICD-10-CM

## 2013-02-05 NOTE — Progress Notes (Signed)
Subjective:   concern about possible gallbladder disease, recent rash and skin itching  Patient ID: [name], male   DOB: 1956-04-19, 57 y.o.   MRN: [id number]  HPI Patient is a 57 year old diabetic male, followed by Dr. Clent Ridges, self-referred for concern over possible gallbladder disease. His concern arose mainly because his sister was recently hospitalized with acute right upper quadrant abdominal pain. Workup indicated possible biliary dyskinesia and I did a cholecystectomy on her urgently last month. She did have chronic cholecystitis and cholesterolosis and had relief of her pain. She also seemed to notice a lot of general improvement in her health and due to her problems and also family history of gallstones and other family members the patient presents on his own concern that he may have gallbladder problems. His chief complaint has been a recent generalized itching rash with some the vesicular type skin eruptions. This however has resolved and these lesions have all healed. On questioning he has really no GI complaints at all. No abdominal pain or food intolerance. No history of jaundice. Colonoscopy is up to date.  Past Medical History  Diagnosis Date  . Diabetes mellitus   . Hypertension   . Viral meningitis   . Hematospermia   . Nephrolithiasis 02/07/07   No past surgical history on file. Current Outpatient Prescriptions  Medication Sig Dispense Refill  . enalapril (VASOTEC) 10 MG tablet TAKE 1 TABLET DAILY  90 tablet  0  . glimepiride (AMARYL) 4 MG tablet TAKE 1 TABLET TWICE A DAY  180 tablet  0  . glucose blood (ONE TOUCH ULTRA TEST) test strip Use as instructed  100 each  3  . Lancets (ACCU-CHEK SOFT TOUCH) lancets Use as instructed  100 each  3  . Lancets (ONETOUCH ULTRASOFT) lancets Use as instructed  100 each  3  . metFORMIN (GLUCOPHAGE) 1000 MG tablet TAKE 1 TABLET TWICE A DAY WITH MEALS  180 tablet  0  . sitaGLIPtin (JANUVIA) 100 MG tablet Take 1 tablet (100 mg total) by mouth  daily.  90 tablet  3  . methylphenidate (RITALIN LA) 30 MG 24 hr capsule Take 1 capsule (30 mg total) by mouth daily.  90 capsule  0  . tadalafil (CIALIS) 20 MG tablet Take 0.5-1 tablets (10-20 mg total) by mouth daily as needed for erectile dysfunction.  30 tablet  3   No current facility-administered medications for this visit.   No Known Allergies    Review of Systems  Respiratory: Negative.   Cardiovascular: Negative.   Gastrointestinal: Negative.   Skin: Positive for rash.       Objective:   Physical Exam BP 126/76  Pulse 97  Temp(Src) 98 F (36.7 C)  Resp 18  Ht 5\' 8"  (1.727 m)  Wt 228 lb (103.42 kg)  BMI 34.68 kg/m2 General: Overweight Caucasian male in no distress Skin: There are occasional several millimeter recently healed scars over the extremities and trunk from his recent rash. No active infection or rash. Lungs: Clear equal breath sounds without appreciable breathing Cardiac: Regular rate and rhythm. No edema Abdomen: Soft and nontender. No masses or organomegaly.     Assessment:     Patient concern regarding possible gallbladder disease due to his sister's recent issues. She also apparently had some rash that resolved after her gallbladder surgery and felt that the gallbladder had been the problem. I discussed with the patient and his wife that from my history and exam I did not see any evidence of any  problems that suggested gallbladder disease. He is reassured. I gave him literature regarding gallbladder disease and asked him to call me if he had any questions. I do not see that any workup for gallbladder disease is indicated at this point.    Plan:     As above.

## 2013-02-24 ENCOUNTER — Other Ambulatory Visit: Payer: Self-pay | Admitting: Family Medicine

## 2013-02-24 NOTE — Telephone Encounter (Signed)
Can we refill this? 

## 2013-02-24 NOTE — Telephone Encounter (Signed)
Give him 90 days of each. He needs an OV soon

## 2013-05-25 ENCOUNTER — Other Ambulatory Visit: Payer: Self-pay | Admitting: Family Medicine

## 2013-05-26 NOTE — Telephone Encounter (Signed)
Can we refill this? Pt has not had any labs recently?

## 2013-05-27 ENCOUNTER — Telehealth: Payer: Self-pay | Admitting: Family Medicine

## 2013-05-27 MED ORDER — ENALAPRIL MALEATE 10 MG PO TABS: 10.0000 mg | ORAL_TABLET | Freq: Every day | ORAL | Status: DC

## 2013-05-27 MED ORDER — GLIMEPIRIDE 4 MG PO TABS: 4.0000 mg | ORAL_TABLET | Freq: Two times a day (BID) | ORAL | Status: DC

## 2013-05-27 MED ORDER — METFORMIN HCL 1000 MG PO TABS: 1000.0000 mg | ORAL_TABLET | Freq: Two times a day (BID) | ORAL | Status: DC

## 2013-05-27 NOTE — Telephone Encounter (Signed)
Pt needed refills on Enalapril, Glimepiride, Metformin, per Dr. Clent Ridges we can send in a 30 day supply only, pt needs office visit. I sent scripts e-scribe and I spoke with pt.

## 2013-05-27 NOTE — Telephone Encounter (Signed)
Refill one month only of each. He needs an OV soon

## 2013-10-20 ENCOUNTER — Other Ambulatory Visit: Payer: Self-pay | Admitting: Family Medicine

## 2013-10-20 DIAGNOSIS — Z0000 Encounter for general adult medical examination without abnormal findings: Secondary | ICD-10-CM

## 2013-10-20 NOTE — Telephone Encounter (Signed)
I put a future CPE lab order in computer. Can you call pt to schedule lab & CPE appointment?

## 2013-10-20 NOTE — Telephone Encounter (Signed)
Pt called to make appt, but wants to come in prior for labs. pls advise?

## 2013-10-23 NOTE — Telephone Encounter (Signed)
lmom for pt to call and sch 

## 2013-11-12 ENCOUNTER — Other Ambulatory Visit (INDEPENDENT_AMBULATORY_CARE_PROVIDER_SITE_OTHER): Payer: BC Managed Care – PPO

## 2013-11-12 DIAGNOSIS — Z0000 Encounter for general adult medical examination without abnormal findings: Secondary | ICD-10-CM

## 2013-11-12 LAB — POCT URINALYSIS DIPSTICK
Blood, UA: NEGATIVE
Leukocytes, UA: NEGATIVE
Nitrite, UA: NEGATIVE
PH UA: 5
PROTEIN UA: NEGATIVE
Spec Grav, UA: 1.02
UROBILINOGEN UA: 1

## 2013-11-12 LAB — CBC WITH DIFFERENTIAL/PLATELET
Basophils Absolute: 0.1 10*3/uL (ref 0.0–0.1)
Basophils Relative: 0.7 % (ref 0.0–3.0)
EOS PCT: 3.2 % (ref 0.0–5.0)
Eosinophils Absolute: 0.2 10*3/uL (ref 0.0–0.7)
HEMATOCRIT: 47.6 % (ref 39.0–52.0)
Hemoglobin: 16.1 g/dL (ref 13.0–17.0)
LYMPHS ABS: 2 10*3/uL (ref 0.7–4.0)
LYMPHS PCT: 25.8 % (ref 12.0–46.0)
MCHC: 33.8 g/dL (ref 30.0–36.0)
MCV: 94.4 fl (ref 78.0–100.0)
MONOS PCT: 5.9 % (ref 3.0–12.0)
Monocytes Absolute: 0.4 10*3/uL (ref 0.1–1.0)
NEUTROS PCT: 64.4 % (ref 43.0–77.0)
Neutro Abs: 4.9 10*3/uL (ref 1.4–7.7)
Platelets: 179 10*3/uL (ref 150.0–400.0)
RBC: 5.04 Mil/uL (ref 4.22–5.81)
RDW: 14.1 % (ref 11.5–15.5)
WBC: 7.6 10*3/uL (ref 4.0–10.5)

## 2013-11-12 LAB — BASIC METABOLIC PANEL
BUN: 20 mg/dL (ref 6–23)
CHLORIDE: 102 meq/L (ref 96–112)
CO2: 26 mEq/L (ref 19–32)
Calcium: 9.8 mg/dL (ref 8.4–10.5)
Creatinine, Ser: 1 mg/dL (ref 0.4–1.5)
GFR: 87.2 mL/min (ref >60.00)
Glucose, Bld: 290 mg/dL — ABNORMAL HIGH (ref 70–99)
POTASSIUM: 5.6 meq/L — AB (ref 3.5–5.1)
Sodium: 138 mEq/L (ref 135–145)

## 2013-11-12 LAB — HEPATIC FUNCTION PANEL
ALBUMIN: 4.2 g/dL (ref 3.5–5.2)
ALT: 31 U/L (ref 0–53)
AST: 23 U/L (ref 0–37)
Alkaline Phosphatase: 103 U/L (ref 39–117)
BILIRUBIN DIRECT: 0.3 mg/dL (ref 0.0–0.3)
TOTAL PROTEIN: 7.3 g/dL (ref 6.0–8.3)
Total Bilirubin: 1.4 mg/dL — ABNORMAL HIGH (ref 0.2–1.2)

## 2013-11-12 LAB — PSA: PSA: 0.54 ng/mL (ref 0.10–4.00)

## 2013-11-12 LAB — LIPID PANEL
CHOLESTEROL: 145 mg/dL (ref 0–200)
HDL: 35 mg/dL — ABNORMAL LOW (ref >39.00)
LDL CALC: 49 mg/dL (ref 0–99)
TRIGLYCERIDES: 306 mg/dL — AB (ref 0.0–149.0)
Total CHOL/HDL Ratio: 4
VLDL: 61.2 mg/dL — AB (ref 0.0–40.0)

## 2013-11-12 LAB — TSH: TSH: 3.61 u[IU]/mL (ref 0.35–4.50)

## 2013-11-12 LAB — HEMOGLOBIN A1C: HEMOGLOBIN A1C: 9.5 % — AB (ref 4.6–6.5)

## 2014-05-19 ENCOUNTER — Encounter: Payer: Self-pay | Admitting: Family Medicine

## 2014-05-19 ENCOUNTER — Ambulatory Visit (INDEPENDENT_AMBULATORY_CARE_PROVIDER_SITE_OTHER): Payer: BC Managed Care – PPO | Admitting: Family Medicine

## 2014-05-19 VITALS — BP 157/81 | HR 81 | Temp 98.6°F | Ht 68.0 in | Wt 214.0 lb

## 2014-05-19 DIAGNOSIS — I10 Essential (primary) hypertension: Secondary | ICD-10-CM

## 2014-05-19 DIAGNOSIS — E119 Type 2 diabetes mellitus without complications: Secondary | ICD-10-CM

## 2014-05-19 LAB — MICROALBUMIN / CREATININE URINE RATIO
CREATININE, U: 93 mg/dL
MICROALB/CREAT RATIO: 0.8 mg/g (ref 0.0–30.0)
Microalb, Ur: 0.7 mg/dL (ref 0.0–1.9)

## 2014-05-19 LAB — LIPID PANEL
CHOLESTEROL: 160 mg/dL (ref 0–200)
HDL: 36 mg/dL — ABNORMAL LOW (ref >39.00)
LDL Cholesterol: 99 mg/dL (ref 0–99)
NONHDL: 124
Total CHOL/HDL Ratio: 4
Triglycerides: 124 mg/dL (ref 0.0–149.0)
VLDL: 24.8 mg/dL (ref 0.0–40.0)

## 2014-05-19 LAB — BASIC METABOLIC PANEL
BUN: 17 mg/dL (ref 6–23)
CO2: 24 mEq/L (ref 19–32)
Calcium: 9.2 mg/dL (ref 8.4–10.5)
Chloride: 98 mEq/L (ref 96–112)
Creatinine, Ser: 0.7 mg/dL (ref 0.4–1.5)
GFR: 123.8 mL/min (ref >60.00)
Glucose, Bld: 259 mg/dL — ABNORMAL HIGH (ref 70–99)
POTASSIUM: 5.3 meq/L — AB (ref 3.5–5.1)
SODIUM: 131 meq/L — AB (ref 135–145)

## 2014-05-19 LAB — HEMOGLOBIN A1C: Hgb A1c MFr Bld: 11.7 % — ABNORMAL HIGH (ref 4.6–6.5)

## 2014-05-19 MED ORDER — METFORMIN HCL 1000 MG PO TABS: 1000.0000 mg | ORAL_TABLET | Freq: Two times a day (BID) | ORAL | Status: DC

## 2014-05-19 MED ORDER — SITAGLIPTIN PHOSPHATE 100 MG PO TABS: 100.0000 mg | ORAL_TABLET | Freq: Every day | ORAL | Status: DC

## 2014-05-19 MED ORDER — GLIMEPIRIDE 4 MG PO TABS: 4.0000 mg | ORAL_TABLET | Freq: Two times a day (BID) | ORAL | Status: DC

## 2014-05-19 MED ORDER — METHYLPHENIDATE HCL ER (LA) 30 MG PO CP24: 30.0000 mg | ORAL_CAPSULE | Freq: Every day | ORAL | Status: DC

## 2014-05-19 MED ORDER — ENALAPRIL MALEATE 10 MG PO TABS: 10.0000 mg | ORAL_TABLET | Freq: Every day | ORAL | Status: DC

## 2014-05-19 NOTE — Progress Notes (Signed)
Pre visit review using our clinic review tool, if applicable. No additional management support is needed unless otherwise documented below in the visit note. 

## 2014-05-19 NOTE — Progress Notes (Signed)
   Subjective:    Patient ID: [name], male    DOB: 05/13/1956, 58 y.o.   MRN: [id number]  HPI Here to follow up on diabetes. He has not been seen for a year and a half. He had labs in May but is just now coming in to discuss them. He ran out of meds several months ago. He feels fine but of course his glucoses are through the roof. His fasting glucose this am was 237. His A1c in May was up to 9.4. He admits to eating a poor diet and not exercising.    Review of Systems  Constitutional: Negative.   Respiratory: Negative.   Cardiovascular: Negative.        Objective:   Physical Exam  Constitutional: He appears well-developed and well-nourished.  Cardiovascular: Normal rate, regular rhythm, normal heart sounds and intact distal pulses.   Pulmonary/Chest: Effort normal and breath sounds normal.          Assessment & Plan:  We had a [place] discussion about his poor diabetic control. I recommended he switch to  Insulin but he is very resistant to this. He agreed to getting back on his usual meds and watching a stricter diet. We will get labs to day and recheck labs in 90 days.

## 2014-05-20 ENCOUNTER — Telehealth: Payer: Self-pay | Admitting: Family Medicine

## 2014-05-20 NOTE — Telephone Encounter (Signed)
emmi emailed °

## 2014-09-04 ENCOUNTER — Ambulatory Visit (INDEPENDENT_AMBULATORY_CARE_PROVIDER_SITE_OTHER): Payer: BLUE CROSS/BLUE SHIELD | Admitting: Family Medicine

## 2014-09-04 ENCOUNTER — Other Ambulatory Visit (INDEPENDENT_AMBULATORY_CARE_PROVIDER_SITE_OTHER): Payer: BLUE CROSS/BLUE SHIELD

## 2014-09-04 ENCOUNTER — Other Ambulatory Visit: Payer: Self-pay | Admitting: Family Medicine

## 2014-09-04 DIAGNOSIS — E119 Type 2 diabetes mellitus without complications: Secondary | ICD-10-CM

## 2014-09-04 LAB — HEMOGLOBIN A1C: Hgb A1c MFr Bld: 9.8 % — ABNORMAL HIGH (ref 4.6–6.5)

## 2014-09-09 NOTE — Addendum Note (Signed)
Addended by: Gershon CraneFRY, STEPHEN A on: 09/09/2014 06:19 AM   Modules accepted: Orders

## 2014-09-21 ENCOUNTER — Encounter: Payer: Self-pay | Admitting: Endocrinology

## 2014-09-21 ENCOUNTER — Ambulatory Visit (INDEPENDENT_AMBULATORY_CARE_PROVIDER_SITE_OTHER): Payer: BLUE CROSS/BLUE SHIELD | Admitting: Endocrinology

## 2014-09-21 VITALS — BP 136/84 | HR 98 | Temp 98.6°F | Ht 68.0 in | Wt 218.0 lb

## 2014-09-21 DIAGNOSIS — E119 Type 2 diabetes mellitus without complications: Secondary | ICD-10-CM | POA: Diagnosis not present

## 2014-09-21 DIAGNOSIS — R079 Chest pain, unspecified: Secondary | ICD-10-CM | POA: Diagnosis not present

## 2014-09-21 MED ORDER — OMEPRAZOLE 40 MG PO CPDR: 40.0000 mg | DELAYED_RELEASE_CAPSULE | Freq: Every day | ORAL | Status: DC

## 2014-09-21 MED ORDER — GLUCOSE BLOOD VI STRP: ORAL_STRIP | Status: DC

## 2014-09-21 MED ORDER — LOSARTAN POTASSIUM-HCTZ 50-12.5 MG PO TABS
1.0000 | ORAL_TABLET | Freq: Every day | ORAL | Status: DC
Start: 2014-09-21 — End: 2014-11-20

## 2014-09-21 NOTE — Progress Notes (Signed)
Subjective:    Patient ID: [name], male    DOB: April 25, 1956, 59 y.o.   MRN: [id number]  HPI pt states DM was dx'ed in 2000; he has mild neuropathy of the lower extremities; he is unaware of any associated chronic complications; he has never been on insulin; pt says his diet and exercise are intermittently good; he has never had pancreatitis, severe hypoglycemia or DKA.  Pt says he misses approx 10% of med doses.  He did not tolerate januvia (heartburn).  Since he stopped it, sxs are improved but not resolved.   Past Medical History  Diagnosis Date  . Diabetes mellitus   . Hypertension   . Viral meningitis   . Hematospermia   . Nephrolithiasis 02/07/07    No past surgical history on file.  History   Social History  . Marital Status: Married    Spouse Name: N/A  . Number of Children: N/A  . Years of Education: N/A   Occupational History  . Not on file.   Social History Main Topics  . Smoking status: Former Games developer  . Smokeless tobacco: Never Used     Comment: 22 yrs ago   . Alcohol Use: No  . Drug Use: No  . Sexual Activity: Not on file   Other Topics Concern  . Not on file   Social History Narrative    Current Outpatient Prescriptions on File Prior to Visit  Medication Sig Dispense Refill  . glimepiride (AMARYL) 4 MG tablet Take 1 tablet (4 mg total) by mouth 2 (two) times daily. 60 tablet 0  . Lancets (ONETOUCH ULTRASOFT) lancets Use as instructed 100 each 3  . metFORMIN (GLUCOPHAGE) 1000 MG tablet Take 1 tablet (1,000 mg total) by mouth 2 (two) times daily with a meal. 60 tablet 0  . methylphenidate (RITALIN LA) 30 MG 24 hr capsule Take 1 capsule (30 mg total) by mouth daily. 90 capsule 0  . sitaGLIPtin (JANUVIA) 100 MG tablet Take 1 tablet (100 mg total) by mouth daily. (Patient not taking: Reported on 09/21/2014) 30 tablet 0  . tadalafil (CIALIS) 20 MG tablet Take 0.5-1 tablets (10-20 mg total) by mouth daily as needed for erectile dysfunction. 30 tablet 3   No  current facility-administered medications on file prior to visit.    No Known Allergies  Family History  Problem Relation Age of Onset  . Arthritis    . Hypertension    . Stroke    . Coronary artery disease    . Rheum arthritis Mother   . Diabetes Father   . Heart disease Father   . Cholecystitis Sister   . Cholecystitis Brother   . Birth defects Neg Hx   . Diabetes Brother   . Diabetes Brother   . Parkinson's disease Brother   . Heart disease Sister   . Diabetes Sister   . Cholecystitis Sister   . Diabetes Sister     BP 136/84 mmHg  Pulse 98  Temp(Src) 98.6 F (37 C) (Oral)  Ht  (1.727 m)  Wt 218 lb (98.884 kg)  BMI 33.15 kg/m2  SpO2 96%   Review of Systems denies weight loss, blurry vision, headache, chest pain, sob, n/v, urinary frequency, muscle cramps, excessive diaphoresis, depression, cold intolerance, rhinorrhea, and easy bruising.       Objective:   Physical Exam VITAL SIGNS:  See vs page GENERAL: no distress Pulses: dorsalis pedis intact bilat.   MSK: no deformity of the feet CV: 1+ bilat  leg edema.  There is bilateral onychomycosis of the toenails.   Skin:  no ulcer on the feet.  normal color and temp on the feet. Neuro: sensation is intact to touch on the feet.     Lab Results  Component Value Date   HGBA1C 9.8* 09/04/2014   i personally reviewed electrocardiogram tracing: no significant change since 2010.    Assessment & Plan:  DM: severe exacerbation.  We discussed insulin.  He wants to exhaust oral options first. Heartburn/atypical chest pain, new. Hyperkalemia: this limits oral rx options.    Patient is advised the following: Patient Instructions  good diet and exercise significantly improve the control of your diabetes.  please let me know if you wish to be referred to a dietician.  high blood sugar is very risky to your health.  you should see an eye doctor and dentist every year.  It is very important to get all recommended  vaccinations.  controlling your blood pressure and cholesterol drastically reduces the damage diabetes does to your body.  Those who smoke should quit.  please discuss these with your doctor.  check your blood sugar once a day.  vary the time of day when you check, between before the 3 meals, and at bedtime.  also check if you have symptoms of your blood sugar being too high or too low.  please keep a record of the readings and bring it to your next appointment here.  You can write it on any piece of paper.  please call us sooner if your blood sugar goes below 70, or if you have a lot of readings over 200.   Let's check a "treadmill" (heart) test.   i have sent a prescription to your pharmacy, for the heartburn.  Please retry the [place]uvia.   Please call if the blood sugar stays high, so we can add "bromocriptine," to help your blood sugar. It has possible side effects of nausea and dizziness.  These go away with time.  You can avoid these by taking it at bedtime, and by taking just take 1/2 pill for the first week.   i have sent a prescription to your pharmacy, to change the enalapril to another BP med. When you potassium improves, we can consider adding "invokana."   Please come back for a follow-up appointment in 1 month.

## 2014-09-21 NOTE — Patient Instructions (Addendum)
good diet and exercise significantly improve the control of your diabetes.  please let me know if you wish to be referred to a dietician.  high blood sugar is very risky to your health.  you should see an eye doctor and dentist every year.  It is very important to get all recommended vaccinations.  controlling your blood pressure and cholesterol drastically reduces the damage diabetes does to your body.  Those who smoke should quit.  please discuss these with your doctor.  check your blood sugar once a day.  vary the time of day when you check, between before the 3 meals, and at bedtime.  also check if you have symptoms of your blood sugar being too high or too low.  please keep a record of the readings and bring it to your next appointment here.  You can write it on any piece of paper.  please call us sooner if your blood sugar goes below 70, or if you have a lot of readings over 200.   Let's check a "treadmill" (heart) test.   i have sent a prescription to your pharmacy, for the heartburn.  Please retry the [place]uvia.   Please call if the blood sugar stays high, so we can add "bromocriptine," to help your blood sugar. It has possible side effects of nausea and dizziness.  These go away with time.  You can avoid these by taking it at bedtime, and by taking just take 1/2 pill for the first week.   i have sent a prescription to your pharmacy, to change the enalapril to another BP med. When you potassium improves, we can consider adding "invokana."   Please come back for a follow-up appointment in 1 month.

## 2014-09-28 ENCOUNTER — Encounter: Payer: Self-pay | Admitting: Endocrinology

## 2014-09-29 ENCOUNTER — Other Ambulatory Visit: Payer: Self-pay | Admitting: Endocrinology

## 2014-09-29 ENCOUNTER — Other Ambulatory Visit: Payer: Self-pay

## 2014-09-29 MED ORDER — BROMOCRIPTINE MESYLATE 2.5 MG PO TABS: 1.2500 mg | ORAL_TABLET | Freq: Every day | ORAL | Status: DC

## 2014-09-29 MED ORDER — GLUCOSE BLOOD VI STRP: ORAL_STRIP | Status: DC

## 2014-09-30 ENCOUNTER — Encounter: Payer: Self-pay | Admitting: Endocrinology

## 2014-10-12 ENCOUNTER — Telehealth: Payer: Self-pay | Admitting: Family Medicine

## 2014-10-12 MED ORDER — METHYLPHENIDATE HCL ER (LA) 30 MG PO CP24: 30.0000 mg | ORAL_CAPSULE | Freq: Every day | ORAL | Status: DC

## 2014-10-12 NOTE — Telephone Encounter (Signed)
done

## 2014-10-12 NOTE — Addendum Note (Signed)
Addended by: Gershon CraneFRY, STEPHEN A on: 10/12/2014 04:57 PM   Modules accepted: Orders

## 2014-10-13 NOTE — Telephone Encounter (Signed)
Spoke with pt and pt is aware

## 2014-10-15 ENCOUNTER — Encounter (HOSPITAL_COMMUNITY): Payer: BLUE CROSS/BLUE SHIELD

## 2014-10-21 ENCOUNTER — Encounter: Payer: Self-pay | Admitting: Endocrinology

## 2014-10-21 ENCOUNTER — Encounter: Payer: Self-pay | Admitting: Family Medicine

## 2014-10-21 ENCOUNTER — Ambulatory Visit (INDEPENDENT_AMBULATORY_CARE_PROVIDER_SITE_OTHER): Payer: BLUE CROSS/BLUE SHIELD | Admitting: Endocrinology

## 2014-10-21 ENCOUNTER — Ambulatory Visit (INDEPENDENT_AMBULATORY_CARE_PROVIDER_SITE_OTHER): Payer: BLUE CROSS/BLUE SHIELD | Admitting: Family Medicine

## 2014-10-21 VITALS — BP 152/82 | HR 94 | Temp 98.8°F | Ht 68.0 in | Wt 213.0 lb

## 2014-10-21 VITALS — BP 144/82 | HR 97 | Temp 98.2°F | Ht 68.0 in | Wt 212.0 lb

## 2014-10-21 DIAGNOSIS — E875 Hyperkalemia: Secondary | ICD-10-CM | POA: Diagnosis not present

## 2014-10-21 DIAGNOSIS — N289 Disorder of kidney and ureter, unspecified: Secondary | ICD-10-CM | POA: Diagnosis not present

## 2014-10-21 DIAGNOSIS — R1013 Epigastric pain: Secondary | ICD-10-CM | POA: Diagnosis not present

## 2014-10-21 LAB — HEPATIC FUNCTION PANEL
ALBUMIN: 4.5 g/dL (ref 3.5–5.2)
ALK PHOS: 68 U/L (ref 39–117)
ALT: 26 U/L (ref 0–53)
AST: 16 U/L (ref 0–37)
BILIRUBIN TOTAL: 1.2 mg/dL (ref 0.2–1.2)
Bilirubin, Direct: 0.3 mg/dL (ref 0.0–0.3)
Total Protein: 7.6 g/dL (ref 6.0–8.3)

## 2014-10-21 LAB — BASIC METABOLIC PANEL
BUN: 14 mg/dL (ref 6–23)
BUN: 15 mg/dL (ref 6–23)
CALCIUM: 9.7 mg/dL (ref 8.4–10.5)
CHLORIDE: 99 meq/L (ref 96–112)
CO2: 27 mEq/L (ref 19–32)
CO2: 30 mEq/L (ref 19–32)
CREATININE: 1.01 mg/dL (ref 0.40–1.50)
CREATININE: 0.93 mg/dL (ref 0.40–1.50)
Calcium: 9.9 mg/dL (ref 8.4–10.5)
Chloride: 98 mEq/L (ref 96–112)
GFR: 80.97 mL/min (ref >60.00)
GFR: 89.06 mL/min (ref >60.00)
Glucose, Bld: 197 mg/dL — ABNORMAL HIGH (ref 70–99)
Glucose, Bld: 228 mg/dL — ABNORMAL HIGH (ref 70–99)
POTASSIUM: 4.4 meq/L (ref 3.5–5.1)
Potassium: 4 mEq/L (ref 3.5–5.1)
Sodium: 136 mEq/L (ref 135–145)
Sodium: 135 mEq/L (ref 135–145)

## 2014-10-21 LAB — LIPASE: Lipase: 42 U/L (ref 11.0–59.0)

## 2014-10-21 LAB — AMYLASE: Amylase: 41 U/L (ref 27–131)

## 2014-10-21 MED ORDER — CANAGLIFLOZIN 300 MG PO TABS: 300.0000 mg | ORAL_TABLET | Freq: Every day | ORAL | Status: DC

## 2014-10-21 NOTE — Progress Notes (Signed)
Subjective:    Patient ID: [name], male    DOB: 04/20/1956, 59 y.o.   MRN: [id number]  HPI Pt returns for f/u of diabetes mellitus: DM type: 2 Dx'ed: 2000 Complications: polyneuropathy Therapy: 4 oral meds DKA: never Severe hypoglycemia: never Pancreatitis: never Other: he has never been on insulin. Interval history: he has re-tried the [place]ajanuvia, and tolerates it better now.  he brings a record of his cbg's which i have reviewed today.  It varies from 90-200.  Pt canceled his treadmill appointment.  He has a slight "sensitivity" at the RUQ, but no assoc chest pain.  Past Medical History  Diagnosis Date  . Diabetes mellitus   . Hypertension   . Viral meningitis   . Hematospermia   . Nephrolithiasis 02/07/07    No past surgical history on file.  History   Social History  . Marital Status: Married    Spouse Name: N/A  . Number of Children: N/A  . Years of Education: N/A   Occupational History  . Not on file.   Social History Main Topics  . Smoking status: Former Games developermoker  . Smokeless tobacco: Never Used     Comment: 22 yrs ago   . Alcohol Use: No  . Drug Use: No  . Sexual Activity: Not on file   Other Topics Concern  . Not on file   Social History Narrative    Current Outpatient Prescriptions on File Prior to Visit  Medication Sig Dispense Refill  . B Complex Vitamins (B-COMPLEX/B-12) TABS Take by mouth.    . bromocriptine (PARLODEL) 2.5 MG tablet Take 0.5 tablets (1.25 mg total) by mouth at bedtime. 15 tablet 11  . diphenhydrAMINE (SOMINEX) 25 MG tablet Take 25 mg by mouth at bedtime as needed for sleep.    Marland Kitchen. glimepiride (AMARYL) 4 MG tablet Take 1 tablet (4 mg total) by mouth 2 (two) times daily. 60 tablet 0  . glucose blood (ONE TOUCH ULTRA TEST) test strip Use to check blood sugar 1 per day. Dx code E11.9 100 each 2  . Lancets (ONETOUCH ULTRASOFT) lancets Use as instructed 100 each 3  . losartan-hydrochlorothiazide (HYZAAR) 50-12.5 MG per tablet Take 1  tablet by mouth daily. 90 tablet 3  . metFORMIN (GLUCOPHAGE) 1000 MG tablet Take 1 tablet (1,000 mg total) by mouth 2 (two) times daily with a meal. 60 tablet 0  . methylphenidate (RITALIN LA) 30 MG 24 hr capsule Take 1 capsule (30 mg total) by mouth daily. 90 capsule 0  . omeprazole (PRILOSEC) 40 MG capsule Take 1 capsule (40 mg total) by mouth daily. (Patient not taking: Reported on 10/21/2014) 30 capsule 3  . sitaGLIPtin (JANUVIA) 100 MG tablet Take 1 tablet (100 mg total) by mouth daily. 30 tablet 0  . tadalafil (CIALIS) 20 MG tablet Take 0.5-1 tablets (10-20 mg total) by mouth daily as needed for erectile dysfunction. 30 tablet 3   No current facility-administered medications on file prior to visit.    No Known Allergies  Family History  Problem Relation Age of Onset  . Arthritis    . Hypertension    . Stroke    . Coronary artery disease    . Rheum arthritis Mother   . Diabetes Father   . Heart disease Father   . Cholecystitis Sister   . Cholecystitis Brother   . Birth defects Neg Hx   . Diabetes Brother   . Diabetes Brother   . Parkinson's disease Brother   .  Heart disease Sister   . Diabetes Sister   . Cholecystitis Sister   . Diabetes Sister     BP 144/82 mmHg  Pulse 97  Temp(Src) 98.2 F (36.8 C) (Oral)  Ht 5\' 8"  (1.727 m)  Wt 212 lb (96.163 kg)  BMI 32.24 kg/m2  SpO2 98%    Review of Systems Denies n/v.  Heartburn is much better.     Objective:   Physical Exam VITAL SIGNS:  See vs page GENERAL: no distress Ext: no edema   Lab Results  Component Value Date   CREATININE 0.93 10/21/2014   BUN 15 10/21/2014   NA 135 10/21/2014   K 4.4 10/21/2014   CL 99 10/21/2014   CO2 30 10/21/2014      Assessment & Plan:  abd sxs, new, uncertain etiology Hyperkalemia: resolved, so he can take invokana now DM: he needs increased rx    Patient is advised the following: Patient Instructions  check your blood sugar once a day.  vary the time of day when you  check, between before the 3 meals, and at bedtime.  also check if you have symptoms of your blood sugar being too high or too low.  please keep a record of the readings and bring it to your next appointment here.  You can write it on any piece of paper.  please call us sooner if your blood sugar goes below 70, or if you have a lot of readings over 200.   When you potassium improves, we can consider adding "invokana."  When we do so, we can also reduce the glimepiride.   Please come back for a follow-up appointment in 1 month.   Please see Dr Clent RidgesFry for the stomach symptoms and blood pressure.   Please continue the same omeprazole for now.    blood tests are requested for you today.  We'll let you know about the results.   addendum: i have sent a prescription to your pharmacy, to add invokana

## 2014-10-21 NOTE — Progress Notes (Signed)
   Subjective:    Patient ID: [name], male    DOB: 11/28/1955, 59 y.o.   MRN: [id number]  HPI Here asking about 2 weeks of intermittent upper abdominal pains. Sometimes these are in the RUQ and right flank, sometimes in the LUQ or the left middle back. No fever. No urinary sx. BMs are regular. No fever or nasuea. He saw Dr. Everardo AllEllison this am and they discussed the possibility of Januvia causing a pancreatitis. He was told to ask us about this. He was not told to stop the Januvia however. He is already on Omeprazole daily.    Review of Systems  Constitutional: Negative.   Respiratory: Negative.   Cardiovascular: Negative.   Gastrointestinal: Positive for abdominal pain. Negative for nausea, vomiting, diarrhea, constipation, blood in stool, abdominal distention and anal bleeding.  Genitourinary: Negative.        Objective:   Physical Exam  Constitutional: He appears well-developed and well-nourished. No distress.  Cardiovascular: Normal rate, regular rhythm, normal heart sounds and intact distal pulses.   Pulmonary/Chest: Effort normal and breath sounds normal.  Abdominal: Soft. Bowel sounds are normal. He exhibits no distension and no mass. There is no tenderness. There is no rebound and no guarding.          Assessment & Plan:  Upper abdominal pains of uncertain etiology. Doubt this is pancreatitis but we will investigate with labs and an US.

## 2014-10-21 NOTE — Progress Notes (Signed)
Pre visit review using our clinic review tool, if applicable. No additional management support is needed unless otherwise documented below in the visit note. 

## 2014-10-21 NOTE — Patient Instructions (Addendum)
check your blood sugar once a day.  vary the time of day when you check, between before the 3 meals, and at bedtime.  also check if you have symptoms of your blood sugar being too high or too low.  please keep a record of the readings and bring it to your next appointment here.  You can write it on any piece of paper.  please call us sooner if your blood sugar goes below 70, or if you have a lot of readings over 200.   When you potassium improves, we can consider adding "invokana."  When we do so, we can also reduce the glimepiride.   Please come back for a follow-up appointment in 1 month.   Please see Dr Clent RidgesFry for the stomach symptoms and blood pressure.   Please continue the same omeprazole for now.    blood tests are requested for you today.  We'll let you know about the results.

## 2014-10-28 ENCOUNTER — Encounter: Payer: Self-pay | Admitting: Family Medicine

## 2014-10-28 ENCOUNTER — Ambulatory Visit
Admission: RE | Admit: 2014-10-28 | Discharge: 2014-10-28 | Disposition: A | Discharge status: Home / Self Care | Payer: BLUE CROSS/BLUE SHIELD | Source: Ambulatory Visit | Attending: Family Medicine | Admitting: Family Medicine

## 2014-10-28 DIAGNOSIS — R1013 Epigastric pain: Secondary | ICD-10-CM

## 2014-10-28 NOTE — Addendum Note (Signed)
Addended by: Gershon CraneFRY, Alessia Gonsalez A on: 10/28/2014 05:12 PM   Modules accepted: Orders

## 2014-10-28 NOTE — Telephone Encounter (Signed)
I ordered an MRI of the abdomen to check this

## 2014-10-28 NOTE — Addendum Note (Signed)
Addended by: Gershon CraneFRY, STEPHEN A on: 10/28/2014 12:44 PM   Modules accepted: Orders

## 2014-11-04 ENCOUNTER — Telehealth: Payer: Self-pay | Admitting: Family Medicine

## 2014-11-04 MED ORDER — TRAMADOL HCL 50 MG PO TABS: ORAL_TABLET | ORAL | Status: DC

## 2014-11-04 NOTE — Telephone Encounter (Signed)
Call in Tramadol 50 mg to take 1-2 tabs every 6 hours prn pain, #60 with no rf 

## 2014-11-04 NOTE — Telephone Encounter (Signed)
I spoke with pt and called in script. 

## 2014-11-04 NOTE — Telephone Encounter (Signed)
Pt will see GS on Friday for gallbladder inflammation. Pt would [facility] pain med [facility] [place]

## 2014-11-06 ENCOUNTER — Other Ambulatory Visit: Payer: Self-pay | Admitting: Surgery

## 2014-11-18 ENCOUNTER — Ambulatory Visit
Admission: RE | Admit: 2014-11-18 | Discharge: 2014-11-18 | Disposition: A | Discharge status: Home / Self Care | Payer: BLUE CROSS/BLUE SHIELD | Source: Ambulatory Visit | Attending: Family Medicine | Admitting: Family Medicine

## 2014-11-18 ENCOUNTER — Encounter: Payer: Self-pay | Admitting: Family Medicine

## 2014-11-18 DIAGNOSIS — N289 Disorder of kidney and ureter, unspecified: Secondary | ICD-10-CM

## 2014-11-18 MED ORDER — GADOBENATE DIMEGLUMINE 529 MG/ML IV SOLN
20.0000 mL | Freq: Once | INTRAVENOUS | Status: AC | PRN
  Administered 2014-11-18: 20 mL via INTRAVENOUS

## 2014-11-20 ENCOUNTER — Other Ambulatory Visit: Payer: Self-pay

## 2014-11-20 ENCOUNTER — Other Ambulatory Visit: Payer: Self-pay | Admitting: Surgery

## 2014-11-20 MED ORDER — LOSARTAN POTASSIUM-HCTZ 50-12.5 MG PO TABS
1.0000 | ORAL_TABLET | Freq: Every day | ORAL | Status: DC
Start: 2014-11-20 — End: 2017-05-17

## 2014-11-23 ENCOUNTER — Ambulatory Visit: Payer: BLUE CROSS/BLUE SHIELD | Admitting: Endocrinology

## 2014-11-27 ENCOUNTER — Encounter: Payer: Self-pay | Admitting: Family Medicine

## 2014-12-04 NOTE — Telephone Encounter (Signed)
Patient is scheduled for 12/22/14 to have A1C checked.  Can you please enter the order?

## 2014-12-04 NOTE — Telephone Encounter (Signed)
I have referred him to see Dr. Everardo All for his diabetes, so Dr. Everardo All would need to order this test

## 2014-12-07 ENCOUNTER — Other Ambulatory Visit: Payer: Self-pay | Admitting: Surgery

## 2014-12-07 HISTORY — PX: CHOLECYSTECTOMY: SHX55

## 2014-12-10 NOTE — Telephone Encounter (Signed)
Yes that is correct. 

## 2014-12-11 LAB — HM DIABETES EYE EXAM

## 2014-12-14 ENCOUNTER — Other Ambulatory Visit: Payer: Self-pay

## 2014-12-15 ENCOUNTER — Other Ambulatory Visit: Payer: Self-pay | Admitting: *Deleted

## 2014-12-15 ENCOUNTER — Other Ambulatory Visit: Payer: Self-pay | Admitting: Surgery

## 2014-12-15 ENCOUNTER — Ambulatory Visit (HOSPITAL_COMMUNITY)
Admission: RE | Admit: 2014-12-15 | Discharge: 2014-12-15 | Disposition: A | Discharge status: Home / Self Care | Payer: BLUE CROSS/BLUE SHIELD | Source: Ambulatory Visit | Attending: Surgery | Admitting: Surgery

## 2014-12-15 DIAGNOSIS — R0602 Shortness of breath: Secondary | ICD-10-CM | POA: Diagnosis not present

## 2014-12-15 DIAGNOSIS — E119 Type 2 diabetes mellitus without complications: Secondary | ICD-10-CM

## 2014-12-22 ENCOUNTER — Other Ambulatory Visit: Payer: BLUE CROSS/BLUE SHIELD

## 2014-12-23 ENCOUNTER — Ambulatory Visit: Payer: BLUE CROSS/BLUE SHIELD | Admitting: Endocrinology

## 2014-12-29 ENCOUNTER — Encounter: Payer: Self-pay | Admitting: Family Medicine

## 2015-01-05 ENCOUNTER — Encounter (HOSPITAL_BASED_OUTPATIENT_CLINIC_OR_DEPARTMENT_OTHER): Admission: RE | Payer: Self-pay | Source: Ambulatory Visit

## 2015-01-05 ENCOUNTER — Ambulatory Visit (HOSPITAL_BASED_OUTPATIENT_CLINIC_OR_DEPARTMENT_OTHER): Admission: RE | Admit: 2015-01-05 | Payer: BLUE CROSS/BLUE SHIELD | Source: Ambulatory Visit | Admitting: Surgery

## 2015-01-05 SURGERY — LAPAROSCOPIC CHOLECYSTECTOMY
Anesthesia: General

## 2015-01-13 ENCOUNTER — Encounter: Payer: Self-pay | Admitting: Family Medicine

## 2015-01-13 ENCOUNTER — Telehealth: Payer: Self-pay | Admitting: Family Medicine

## 2015-01-13 NOTE — Telephone Encounter (Signed)
Patient would like a re-fill on traMADol (ULTRAM) 50 MG tablet sent to [id number] - [place], [place] - [address]

## 2015-01-13 NOTE — Telephone Encounter (Signed)
We will see him tomorrow.

## 2015-01-13 NOTE — Telephone Encounter (Signed)
We will take care of this at the [facility] tomorrow

## 2015-01-13 NOTE — Telephone Encounter (Signed)
This is a duplicate note, please see previous note.  

## 2015-01-14 ENCOUNTER — Encounter: Payer: Self-pay | Admitting: Family Medicine

## 2015-01-14 ENCOUNTER — Ambulatory Visit (INDEPENDENT_AMBULATORY_CARE_PROVIDER_SITE_OTHER): Payer: BLUE CROSS/BLUE SHIELD | Admitting: Family Medicine

## 2015-01-14 VITALS — BP 148/86 | HR 105 | Temp 99.2°F | Ht 68.0 in | Wt 198.0 lb

## 2015-01-14 DIAGNOSIS — E119 Type 2 diabetes mellitus without complications: Secondary | ICD-10-CM | POA: Diagnosis not present

## 2015-01-14 DIAGNOSIS — R109 Unspecified abdominal pain: Secondary | ICD-10-CM

## 2015-01-14 LAB — HEMOGLOBIN A1C: Hgb A1c MFr Bld: 7.1 % — ABNORMAL HIGH (ref 4.6–6.5)

## 2015-01-14 MED ORDER — TRAMADOL HCL 50 MG PO TABS: ORAL_TABLET | ORAL | Status: DC

## 2015-01-14 MED ORDER — GABAPENTIN 100 MG PO CAPS: 100.0000 mg | ORAL_CAPSULE | Freq: Three times a day (TID) | ORAL | Status: DC

## 2015-01-14 MED ORDER — SILDENAFIL CITRATE 50 MG PO TABS: 50.0000 mg | ORAL_TABLET | Freq: Every day | ORAL | Status: DC | PRN

## 2015-01-14 NOTE — Progress Notes (Signed)
   Subjective:    Patient ID: [name], male    DOB: 03/04/1956, 59 y.o.   MRN: [id number]  HPI Here to follow up on chronic right flank pains. He had a cholecystectomy on 12-07-14 and pathology revealed his gall bladder to have only sludge in it. Unfortunately the flank pains were not affected at all and these still bother him on a daily basis. No nausea or fever or any other sx. He notes that he has sharp pains in the center of the back and these seem to shoot around to the front. A recent CXR showed some thoracic spine osteophytes.    Review of Systems  Constitutional: Negative.   Respiratory: Negative.   Cardiovascular: Negative.   Gastrointestinal: Positive for abdominal pain. Negative for nausea, vomiting, diarrhea, constipation, blood in stool, abdominal distention and rectal pain.  Genitourinary: Negative.        Objective:   Physical Exam  Constitutional: He appears well-developed. No distress.  Neck: No thyromegaly present.  Cardiovascular: Normal rate, regular rhythm, normal heart sounds and intact distal pulses.   Pulmonary/Chest: Effort normal and breath sounds normal.  Abdominal: Soft. Bowel sounds are normal. He exhibits no distension and no mass. There is no tenderness. There is no rebound and no guarding.  Musculoskeletal:  Mildly tender along the right side of the thoracic spine  Lymphadenopathy:    He has no cervical adenopathy.  Skin: No rash noted.          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 We will try Gabapentin 100 mg tid for a few weeks to see if this helps. He will follow up in 2-3 weeks

## 2015-01-14 NOTE — Progress Notes (Signed)
Pre visit review using our clinic review tool, if applicable. No additional management support is needed unless otherwise documented below in the visit note. 

## 2015-01-19 ENCOUNTER — Other Ambulatory Visit: Payer: BLUE CROSS/BLUE SHIELD

## 2015-01-27 ENCOUNTER — Ambulatory Visit: Payer: BLUE CROSS/BLUE SHIELD | Admitting: Endocrinology

## 2015-04-19 ENCOUNTER — Other Ambulatory Visit: Payer: Self-pay | Admitting: Family Medicine

## 2015-04-20 ENCOUNTER — Other Ambulatory Visit: Payer: Self-pay

## 2015-04-20 MED ORDER — METFORMIN HCL 1000 MG PO TABS: 1000.0000 mg | ORAL_TABLET | Freq: Two times a day (BID) | ORAL | Status: DC

## 2015-04-20 MED ORDER — GLIMEPIRIDE 4 MG PO TABS: 4.0000 mg | ORAL_TABLET | Freq: Two times a day (BID) | ORAL | Status: DC

## 2015-04-21 ENCOUNTER — Other Ambulatory Visit: Payer: Self-pay | Admitting: Family Medicine

## 2015-04-21 MED ORDER — GABAPENTIN 100 MG PO CAPS: 100.0000 mg | ORAL_CAPSULE | Freq: Three times a day (TID) | ORAL | Status: DC

## 2015-04-23 ENCOUNTER — Other Ambulatory Visit: Payer: Self-pay | Admitting: Family Medicine

## 2015-04-23 MED ORDER — GABAPENTIN 100 MG PO CAPS: 100.0000 mg | ORAL_CAPSULE | Freq: Three times a day (TID) | ORAL | Status: DC

## 2015-07-02 ENCOUNTER — Telehealth: Payer: Self-pay | Admitting: Endocrinology

## 2015-07-02 NOTE — Telephone Encounter (Signed)
please call patient: Ov is due 

## 2015-07-05 NOTE — Telephone Encounter (Signed)
Appointment letter mailed to the pt.  

## 2015-07-18 ENCOUNTER — Other Ambulatory Visit: Payer: Self-pay | Admitting: Endocrinology

## 2015-10-15 ENCOUNTER — Other Ambulatory Visit: Payer: Self-pay | Admitting: Endocrinology

## 2015-10-15 NOTE — Telephone Encounter (Signed)
Please refill x 1 Ov is due  

## 2015-10-15 NOTE — Telephone Encounter (Signed)
Please advise if ok to refill. Last appointment was 10/21/2014.

## 2015-10-15 NOTE — Telephone Encounter (Signed)
Rx sent and appointment letter mailed.  

## 2015-11-22 ENCOUNTER — Other Ambulatory Visit: Payer: Self-pay | Admitting: Endocrinology

## 2015-11-23 ENCOUNTER — Other Ambulatory Visit: Payer: Self-pay

## 2015-11-23 MED ORDER — GLIMEPIRIDE 4 MG PO TABS: 4.0000 mg | ORAL_TABLET | Freq: Two times a day (BID) | ORAL | Status: DC

## 2015-11-23 MED ORDER — METFORMIN HCL 1000 MG PO TABS: 1000.0000 mg | ORAL_TABLET | Freq: Two times a day (BID) | ORAL | Status: DC

## 2015-11-23 NOTE — Addendum Note (Signed)
Addended by: Ann MakiBAILEY, Jadavion Spoelstra T on: 11/23/2015 01:03 PM   Modules accepted: Orders

## 2015-12-20 ENCOUNTER — Encounter: Payer: Self-pay | Admitting: Family Medicine

## 2015-12-20 ENCOUNTER — Telehealth: Payer: Self-pay | Admitting: Family Medicine

## 2015-12-20 ENCOUNTER — Ambulatory Visit (INDEPENDENT_AMBULATORY_CARE_PROVIDER_SITE_OTHER): Payer: BLUE CROSS/BLUE SHIELD | Admitting: Family Medicine

## 2015-12-20 VITALS — BP 148/80 | HR 94 | Temp 98.5°F | Ht 68.0 in | Wt 211.0 lb

## 2015-12-20 DIAGNOSIS — R109 Unspecified abdominal pain: Principal | ICD-10-CM

## 2015-12-20 DIAGNOSIS — R1011 Right upper quadrant pain: Secondary | ICD-10-CM | POA: Diagnosis not present

## 2015-12-20 DIAGNOSIS — E119 Type 2 diabetes mellitus without complications: Secondary | ICD-10-CM | POA: Diagnosis not present

## 2015-12-20 DIAGNOSIS — G8929 Other chronic pain: Secondary | ICD-10-CM | POA: Insufficient documentation

## 2015-12-20 DIAGNOSIS — R10A1 Flank pain, right side: Secondary | ICD-10-CM | POA: Insufficient documentation

## 2015-12-20 LAB — CBC WITH DIFFERENTIAL/PLATELET
Basophils Absolute: 0 10*3/uL (ref 0.0–0.1)
Basophils Relative: 0.4 % (ref 0.0–3.0)
EOS PCT: 5.5 % — AB (ref 0.0–5.0)
Eosinophils Absolute: 0.3 10*3/uL (ref 0.0–0.7)
HEMATOCRIT: 36.6 % — AB (ref 39.0–52.0)
Hemoglobin: 12.7 g/dL — ABNORMAL LOW (ref 13.0–17.0)
LYMPHS ABS: 1.6 10*3/uL (ref 0.7–4.0)
Lymphocytes Relative: 26.8 % (ref 12.0–46.0)
MCHC: 34.7 g/dL (ref 30.0–36.0)
MCV: 92.4 fl (ref 78.0–100.0)
Monocytes Absolute: 0.4 10*3/uL (ref 0.1–1.0)
Monocytes Relative: 6.6 % (ref 3.0–12.0)
NEUTROS PCT: 60.7 % (ref 43.0–77.0)
Neutro Abs: 3.6 10*3/uL (ref 1.4–7.7)
Platelets: 163 10*3/uL (ref 150.0–400.0)
RBC: 3.96 Mil/uL — AB (ref 4.22–5.81)
RDW: 13.6 % (ref 11.5–15.5)
WBC: 5.9 10*3/uL (ref 4.0–10.5)

## 2015-12-20 LAB — BASIC METABOLIC PANEL
BUN: 26 mg/dL — ABNORMAL HIGH (ref 6–23)
CO2: 27 mEq/L (ref 19–32)
Calcium: 9.5 mg/dL (ref 8.4–10.5)
Chloride: 101 mEq/L (ref 96–112)
Creatinine, Ser: 1.21 mg/dL (ref 0.40–1.50)
GFR: 65.46 mL/min (ref >60.00)
Glucose, Bld: 363 mg/dL — ABNORMAL HIGH (ref 70–99)
Potassium: 4.2 mEq/L (ref 3.5–5.1)
SODIUM: 135 meq/L (ref 135–145)

## 2015-12-20 LAB — AMYLASE: Amylase: 46 U/L (ref 27–131)

## 2015-12-20 LAB — HEPATIC FUNCTION PANEL
ALK PHOS: 77 U/L (ref 39–117)
ALT: 47 U/L (ref 0–53)
AST: 39 U/L — ABNORMAL HIGH (ref 0–37)
Albumin: 4 g/dL (ref 3.5–5.2)
Bilirubin, Direct: 0.3 mg/dL (ref 0.0–0.3)
Total Bilirubin: 0.9 mg/dL (ref 0.2–1.2)
Total Protein: 6.7 g/dL (ref 6.0–8.3)

## 2015-12-20 LAB — LIPASE: Lipase: 45 U/L (ref 11.0–59.0)

## 2015-12-20 LAB — HEMOGLOBIN A1C: Hgb A1c MFr Bld: 9.4 % — ABNORMAL HIGH (ref 4.6–6.5)

## 2015-12-20 NOTE — Telephone Encounter (Signed)
Per [place] stacie patient need a BUN creatine prior to appt - pt need to be inform when to coe into office to have labs   Called pt left msg to call need to inform of scheduled appt  Pt scheduled for 12-22-2015@4 :00 pm - contrast / direction left at front office for pick up  [facility]  [address] 3rd floor  Address: [address], [place], [place] [address]  [phone]

## 2015-12-20 NOTE — Progress Notes (Signed)
   Subjective:    Patient ID: [name], male    DOB: 04/06/1956, 60 y.o.   MRN: [id number]  HPI Here to follow up on chronic right flank pain. One year ago we evaluated [name] with Xrays that revealed some bone spurs in the thoracic spine. We tried [name] on Gabapentin but this did not help. Since then the pain has continued on a daily basis. Sometimes it is sharp and sometimes dull, but it is never severe. His urinary and bowel habits are normal. [name] has noted in the past few months however that the right half of his abdomen seems to be swollen. No jaundice.    Review of Systems  Constitutional: Negative.   Respiratory: Negative.   Cardiovascular: Negative.   Gastrointestinal: Positive for abdominal pain and abdominal distention. Negative for nausea, vomiting, diarrhea, constipation, blood in stool, anal bleeding and rectal pain.  Genitourinary: Negative.   Neurological: Negative.        Objective:   Physical Exam  Constitutional: [name] is oriented to person, place, and time. [name] appears well-developed and well-nourished. No distress.  Cardiovascular: Normal rate, regular rhythm, normal heart sounds and intact distal pulses.   Pulmonary/Chest: Effort normal and breath sounds normal.  Abdominal: Soft. Bowel sounds are normal. [name] exhibits no mass. There is no tenderness. There is no rebound and no guarding.  Right half of the abdomen is a little distended and percussion sounds like [name] has a large amount of air on this side. No HSM. Mildly tender in the RUQ and at the lower anterior right rib margin  Musculoskeletal: [name] exhibits no edema.  Neurological: [name] is alert and oriented to person, place, and time.          Assessment & Plan:  Chronic right flank pain. Get labs today and set up a CT scan soon.

## 2015-12-20 NOTE — Telephone Encounter (Signed)
I spoke with pt and he is going to call below number to confirm appointment.

## 2015-12-20 NOTE — Progress Notes (Signed)
   Subjective:    Patient ID: [name], male    DOB: 04/28/1956, 60 y.o.   MRN: [id number]  HPI    Review of Systems     Objective:   Physical Exam        Assessment & Plan:  See my note above.  Nelwyn SalisburyFRY,Amaan Meyer A, MD

## 2015-12-20 NOTE — Progress Notes (Signed)
Pre visit review using our clinic review tool, if applicable. No additional management support is needed unless otherwise documented below in the visit note. 

## 2015-12-21 LAB — HEPATITIS B SURFACE ANTIBODY,QUALITATIVE: Hep B S Ab: NEGATIVE

## 2015-12-21 LAB — HEPATITIS C ANTIBODY: HCV AB: NEGATIVE

## 2015-12-21 LAB — HEPATITIS B SURFACE ANTIGEN: HEP B S AG: NEGATIVE

## 2015-12-22 ENCOUNTER — Ambulatory Visit (INDEPENDENT_AMBULATORY_CARE_PROVIDER_SITE_OTHER)
Admission: RE | Admit: 2015-12-22 | Discharge: 2015-12-22 | Disposition: A | Discharge status: Home / Self Care | Payer: BLUE CROSS/BLUE SHIELD | Source: Ambulatory Visit | Attending: Family Medicine | Admitting: Family Medicine

## 2015-12-22 DIAGNOSIS — R109 Unspecified abdominal pain: Principal | ICD-10-CM

## 2015-12-22 DIAGNOSIS — R1011 Right upper quadrant pain: Secondary | ICD-10-CM

## 2015-12-22 DIAGNOSIS — G8929 Other chronic pain: Secondary | ICD-10-CM

## 2015-12-22 MED ORDER — IOPAMIDOL (ISOVUE-300) INJECTION 61%
100.0000 mL | Freq: Once | INTRAVENOUS | Status: AC | PRN
  Administered 2015-12-22: 100 mL via INTRAVENOUS

## 2015-12-28 ENCOUNTER — Other Ambulatory Visit: Payer: Self-pay | Admitting: Family Medicine

## 2016-03-22 ENCOUNTER — Other Ambulatory Visit: Payer: Self-pay | Admitting: Endocrinology

## 2016-03-22 NOTE — Telephone Encounter (Signed)
Please refill x 1 Ov is due  

## 2016-08-24 ENCOUNTER — Telehealth: Payer: BLUE CROSS/BLUE SHIELD | Admitting: Physician Assistant

## 2016-08-24 DIAGNOSIS — R634 Abnormal weight loss: Secondary | ICD-10-CM

## 2016-08-24 DIAGNOSIS — R509 Fever, unspecified: Secondary | ICD-10-CM

## 2016-08-24 NOTE — Progress Notes (Signed)
Based on what you shared with me it looks like you have a condition that should be evaluated in a face to face office visit.  Your symptoms have been present for several days, outside of the window where we can give medication for the flu. Also giving high fever at this point you need an examination to determine if symptoms are flu-related or if there is a developing pneumonia, especially giving the weight loss.    NOTE: Even if you have entered your credit card information for this eVisit, you will not be charged.   If you are having a true medical emergency please call 911.  If you need an urgent face to face visit, [place] has four urgent care centers for your convenience.  If you need care fast and have a high deductible or no insurance consider:   [email or web address]  [phone]  [address], [address] [place], [place] [address] 8 am to 8 pm Monday-Friday 10 am to 4 pm Saturday-Sunday   The following sites will take your  insurance:    . [facility]  [phone] Get Driving Directions Find a Provider at this Location  [address] [place], [place] [address] . 10 am to 8 pm Monday-Friday . 12 pm to 8 pm Saturday-Sunday   . [facility]  [phone] Get Driving Directions Find a Provider at this Location  [address] [place], [place] [address] . 8 am to 8 pm Monday-Friday . 9 am to 6 pm Saturday . 11 am to 6 pm Sunday   . [facility]  [phone] Get Driving Directions  [address] [place], [place] [address] . 8 am to 8 pm Monday-Friday . 8 am to 4 pm Saturday-Sunday   Your e-visit answers were reviewed by a board certified advanced clinical practitioner to complete your personal care plan.  Thank you for using e-Visits.

## 2016-10-26 ENCOUNTER — Other Ambulatory Visit: Payer: Self-pay | Admitting: Endocrinology

## 2016-10-26 MED ORDER — METFORMIN HCL 1000 MG PO TABS: 1000.0000 mg | ORAL_TABLET | Freq: Two times a day (BID) | ORAL | 0 refills | Status: DC

## 2016-10-27 ENCOUNTER — Encounter: Payer: Self-pay | Admitting: Endocrinology

## 2016-10-27 ENCOUNTER — Telehealth: Payer: Self-pay | Admitting: Family Medicine

## 2016-10-27 MED ORDER — GABAPENTIN 100 MG PO CAPS: 100.0000 mg | ORAL_CAPSULE | Freq: Three times a day (TID) | ORAL | 1 refills | Status: DC

## 2016-10-27 NOTE — Telephone Encounter (Signed)
Can we refill this? 

## 2016-10-27 NOTE — Telephone Encounter (Signed)
Refill request for Gabapentin 100 mg and a 90 day supply to [facility].

## 2016-10-27 NOTE — Telephone Encounter (Signed)
done

## 2016-10-31 ENCOUNTER — Telehealth: Payer: Self-pay | Admitting: Endocrinology

## 2016-10-31 NOTE — Telephone Encounter (Signed)
Patient dismissed from [facility] by Romero BellingSean Ellison MD , effective Oct 27, 2016. Dismissal letter sent out by certified / registered mail.  daj

## 2016-11-14 NOTE — Telephone Encounter (Signed)
Received signed domestic return receipt verifying delivery of certified letter on Nov 09, 2016. Article number [id number] daj

## 2017-03-08 ENCOUNTER — Encounter: Payer: Self-pay | Admitting: Family Medicine

## 2017-05-17 ENCOUNTER — Encounter: Payer: Self-pay | Admitting: Podiatry

## 2017-05-17 ENCOUNTER — Ambulatory Visit: Payer: BLUE CROSS/BLUE SHIELD | Admitting: Podiatry

## 2017-05-17 VITALS — BP 163/91 | HR 111

## 2017-05-17 DIAGNOSIS — L600 Ingrowing nail: Secondary | ICD-10-CM

## 2017-05-17 DIAGNOSIS — B351 Tinea unguium: Secondary | ICD-10-CM | POA: Diagnosis not present

## 2017-05-17 NOTE — Patient Instructions (Signed)
  THE DAY AFTER YOUR NAIL PROCEDURE  Place 1/4 cup of epsom salts in a quart of warm tap water.  Submerge your foot or feet with outer bandage intact for the initial soak; this will allow the bandage to become moist and wet for easy lift off.  Once you remove your bandage, continue to soak in the solution for 20 minutes.  This soak should be done twice a day.  Next, remove your foot or feet from solution, blot dry the affected area and cover.  You may use a band aid large enough to cover the area or use gauze and tape.  Apply other medications to the area as directed by the doctor such as polysporin neosporin.  IF YOUR SKIN BECOMES IRRITATED WHILE USING THESE INSTRUCTIONS, IT IS OKAY TO SWITCH TO  WHITE VINEGAR AND WATER. Or you may use antibacterial soap and water to keep the toe clean  Monitor for any signs/symptoms of infection. Call the office immediately if any occur or go directly to the emergency room. Call with any questions/concerns.    [facility] Instructions-Post Nail Surgery  You have had your ingrown toenail and root treated with a chemical.  This chemical causes a burn that will drain and ooze like a blister.  This can drain for 6-8 weeks or longer.  It is important to keep this area clean, covered, and follow the soaking instructions dispensed at the time of your surgery.  This area will eventually dry and form a scab.  Once the scab forms you no longer need to soak or apply a dressing.  If at any time you experience an increase in pain, redness, swelling, or drainage, you should contact the office as soon as possible.  

## 2017-05-20 NOTE — Progress Notes (Signed)
  Subjective:  Patient ID: [name], male    DOB: 01/02/1956,  MRN: [id number]  Chief Complaint  Patient presents with  . Nail Problem    bilateral nails, Lt 3rd is detached   61 y.o. male presents with the above complaint.  Elongation and thickening the bilateral great toenails.  Also reports that the left third toenail appears to have partially detached.  Past Medical History:  Diagnosis Date  . Diabetes mellitus   . Hematospermia   . Hypertension   . Nephrolithiasis 02/07/07  . Viral meningitis    Past Surgical History:  Procedure Laterality Date  . CHOLECYSTECTOMY  12-07-14   laparoscopic, per Dr. Rayburn MaBlackmon     Current Outpatient Medications:  .  B Complex Vitamins (B-COMPLEX/B-12) TABS, Take by mouth., Disp: , Rfl:  .  gabapentin (NEURONTIN) 100 MG capsule, Take 1 capsule (100 mg total) by mouth 3 (three) times daily., Disp: 270 capsule, Rfl: 1 .  glucose blood (ONE TOUCH ULTRA TEST) test strip, Use to check blood sugar 1 per day. Dx code E11.9, Disp: 100 each, Rfl: 2 .  Lancets (ONETOUCH ULTRASOFT) lancets, Use as instructed, Disp: 100 each, Rfl: 3 .  metFORMIN (GLUCOPHAGE) 1000 MG tablet, Take 1 tablet (1,000 mg total) by mouth 2 (two) times daily with a meal., Disp: 180 tablet, Rfl: 0  No Known Allergies Review of Systems Objective:   Vitals:   05/17/17 1548  BP: (!) 163/91  Pulse: (!) 111   General AA&O x3. Normal mood and affect.  Vascular Dorsalis pedis and posterior tibial pulses  present 1+ bilaterally  Capillary refill normal to all digits. Pedal hair growth normal.  Neurologic Epicritic sensation grossly present.  Dermatologic No open lesions. Interspaces clear of maceration. Bilateral hallux nails elongated, thickened, dystrophic, with fungus. Left third toenail traumatic avulsion noted.  Nailbed looks healthy without signs of infection or injury  Orthopedic: MMT 5/5 in dorsiflexion, plantarflexion, inversion, and eversion. Normal joint ROM without  pain or crepitus.   Assessment & Plan:  Patient was evaluated and treated and all questions answered.  L Hallux Onychomycosis -Nail avulsed as below due to pain and thickening and evidence of nail growing underneath the nail -Nail sent for histology pathology -Educated on post procedure care  Procedure: Excision of Ingrown Toenail Location: Left 1st toe   Anesthesia: Lidocaine 1% plain; 1.615mL and Marcaine 0.5% plain; 1.545mL, digital block. Skin Prep: Alcohol. Dressing: Silvadene; telfa; dry, sterile, compression dressing. Technique: Following skin prep, the toe was exsanguinated and a tourniquet was secured at the base of the toe.  Nail was freed and avulsed with a hemostat; the tourniquet was then removed and sterile dressing applied. Disposition: Patient tolerated procedure well. Patient to return in 2 weeks for follow-up.  Return in about 2 weeks (around 05/31/2017).

## 2017-06-01 ENCOUNTER — Encounter: Payer: Self-pay | Admitting: Podiatry

## 2017-06-01 ENCOUNTER — Ambulatory Visit (INDEPENDENT_AMBULATORY_CARE_PROVIDER_SITE_OTHER): Payer: BLUE CROSS/BLUE SHIELD | Admitting: Podiatry

## 2017-06-01 DIAGNOSIS — Z9889 Other specified postprocedural states: Secondary | ICD-10-CM

## 2017-06-01 DIAGNOSIS — B351 Tinea unguium: Secondary | ICD-10-CM | POA: Diagnosis not present

## 2017-06-01 DIAGNOSIS — L600 Ingrowing nail: Secondary | ICD-10-CM

## 2017-06-03 NOTE — Progress Notes (Signed)
  Subjective:  Patient ID: [name], male    DOB: 05/19/1956,  MRN: [id number]  Chief Complaint  Patient presents with  . Ingrown Toenail    Follow up nail procedure hallux left   "Its good"   61 y.o. male returns for the above complaint.  States that the left hallux toenail is doing great.  Denies pain.  Objective:   General AA&O x3. Normal mood and affect.  Vascular Foot warm and well perfused with good capillary refill.  Neurologic Sensation grossly intact.  Dermatologic Nail avulsion site healing well without drainage or erythema. Nail bed with overlying soft crust. Left intact. No signs of local infection.  Orthopedic: No tenderness to palpation of the toe.   Assessment & Plan:  Patient was evaluated and treated and all questions answered.  S/p Toenail avulsion, left hallux -Healing well without issue. -Discussed return precautions. -Discussed culture results.  Findings more consistent with traumatic injury however advised patient could try antifungal cream over-the-counter while the toe nail grows to prevent recurrence -F/u PRN

## 2017-07-24 ENCOUNTER — Ambulatory Visit: Payer: BLUE CROSS/BLUE SHIELD | Admitting: Family Medicine

## 2017-07-27 ENCOUNTER — Ambulatory Visit: Payer: BLUE CROSS/BLUE SHIELD | Admitting: Family Medicine

## 2017-07-27 ENCOUNTER — Encounter: Payer: Self-pay | Admitting: Family Medicine

## 2017-07-27 VITALS — BP 138/80 | HR 95 | Temp 97.9°F | Wt 195.2 lb

## 2017-07-27 DIAGNOSIS — E119 Type 2 diabetes mellitus without complications: Secondary | ICD-10-CM

## 2017-07-27 DIAGNOSIS — I10 Essential (primary) hypertension: Secondary | ICD-10-CM | POA: Diagnosis not present

## 2017-07-27 DIAGNOSIS — E1140 Type 2 diabetes mellitus with diabetic neuropathy, unspecified: Secondary | ICD-10-CM

## 2017-07-27 DIAGNOSIS — F411 Generalized anxiety disorder: Secondary | ICD-10-CM

## 2017-07-27 MED ORDER — METFORMIN HCL 1000 MG PO TABS: 1000.0000 mg | ORAL_TABLET | Freq: Two times a day (BID) | ORAL | 3 refills | Status: DC

## 2017-07-27 MED ORDER — ENALAPRIL MALEATE 10 MG PO TABS: 10.0000 mg | ORAL_TABLET | Freq: Every day | ORAL | 3 refills | Status: DC

## 2017-07-27 MED ORDER — ALPRAZOLAM 0.25 MG PO TABS: 0.2500 mg | ORAL_TABLET | Freq: Three times a day (TID) | ORAL | 2 refills | Status: DC | PRN

## 2017-07-27 MED ORDER — GABAPENTIN 300 MG PO CAPS: 300.0000 mg | ORAL_CAPSULE | Freq: Three times a day (TID) | ORAL | 3 refills | Status: DC

## 2017-07-27 NOTE — Progress Notes (Signed)
   Subjective:    Patient ID: [name], male    DOB: 04/08/1956, 62 y.o.   MRN: [id number]  HPI Here for several issues. We have not seen him in a year and a half. He had been seeing Dr. Everardo AllEllison for his diabetes, but he was dismissed from the Endocrine clinic for not keeping appointments. He feels well in general. His glucoses have ranged from 140 to 180 fasting. His BP has been borderline high. He had been on Enalapril in the past. His neuropathy has been a little worse lately, causing numbness or burning in the feet. Finally he has been struggling with some anxiety lately. Partly this is the result of a demotion at his job. He cannot relax and he has trouble sleeping. Appetite is good.   Review of Systems  Constitutional: Negative.   Respiratory: Negative.   Cardiovascular: Negative.   Neurological: Positive for numbness. Negative for weakness.       Objective:   Physical Exam  Constitutional: He is oriented to person, place, and time. He appears well-developed and well-nourished.  Cardiovascular: Normal rate, regular rhythm, normal heart sounds and intact distal pulses.  Pulmonary/Chest: Effort normal and breath sounds normal. No respiratory distress. He has no wheezes. He has no rales.  Musculoskeletal: He exhibits no edema.  Neurological: He is alert and oriented to person, place, and time.          Assessment & Plan:  For the diabetes we will refill the Metformin. Get labs today including an A1c and a BMET. For the HTN get back on Enalapril 10 mg daily. For the neuropathy we will increase Gabapentin to 300 mg TID. For the anxiety, try Xanax 0.25 mg as needed. Gershon CraneStephen Syndey Jaskolski, MD

## 2017-07-28 LAB — HEPATIC FUNCTION PANEL
AG RATIO: 1.5 (calc) (ref 1.0–2.5)
ALKALINE PHOSPHATASE (APISO): 77 U/L (ref 40–115)
ALT: 14 U/L (ref 9–46)
AST: 11 U/L (ref 10–35)
Albumin: 4.3 g/dL (ref 3.6–5.1)
BILIRUBIN INDIRECT: 0.9 mg/dL (ref 0.2–1.2)
Bilirubin, Direct: 0.2 mg/dL (ref 0.0–0.2)
Globulin: 2.8 g/dL (calc) (ref 1.9–3.7)
TOTAL PROTEIN: 7.1 g/dL (ref 6.1–8.1)
Total Bilirubin: 1.1 mg/dL (ref 0.2–1.2)

## 2017-07-28 LAB — CBC WITH DIFFERENTIAL/PLATELET
BASOS ABS: 102 {cells}/uL (ref 0–200)
Basophils Relative: 1.5 %
Eosinophils Absolute: 272 cells/uL (ref 15–500)
Eosinophils Relative: 4 %
HEMATOCRIT: 41.1 % (ref 38.5–50.0)
Hemoglobin: 14.7 g/dL (ref 13.2–17.1)
LYMPHS ABS: 1734 {cells}/uL (ref 850–3900)
MCH: 31.6 pg (ref 27.0–33.0)
MCHC: 35.8 g/dL (ref 32.0–36.0)
MCV: 88.4 fL (ref 80.0–100.0)
MONOS PCT: 8.2 %
MPV: 11.7 fL (ref 7.5–12.5)
NEUTROS PCT: 60.8 %
Neutro Abs: 4134 cells/uL (ref 1500–7800)
PLATELETS: 181 10*3/uL (ref 140–400)
RBC: 4.65 10*6/uL (ref 4.20–5.80)
RDW: 12.3 % (ref 11.0–15.0)
Total Lymphocyte: 25.5 %
WBC mixed population: 558 cells/uL (ref 200–950)
WBC: 6.8 10*3/uL (ref 3.8–10.8)

## 2017-07-28 LAB — BASIC METABOLIC PANEL
BUN: 15 mg/dL (ref 7–25)
CALCIUM: 9.9 mg/dL (ref 8.6–10.3)
CO2: 25 mmol/L (ref 20–32)
Chloride: 98 mmol/L (ref 98–110)
Creat: 0.77 mg/dL (ref 0.70–1.33)
GLUCOSE: 296 mg/dL — AB (ref 65–99)
POTASSIUM: 4.7 mmol/L (ref 3.5–5.3)
SODIUM: 134 mmol/L — AB (ref 135–146)

## 2017-07-28 LAB — HEMOGLOBIN A1C
EAG (MMOL/L): 17.3 (calc)
HEMOGLOBIN A1C: 12.5 %{Hb} — AB (ref <5.7)
Mean Plasma Glucose: 312 (calc)

## 2017-07-28 LAB — TSH: TSH: 3.42 m[IU]/L (ref 0.40–4.50)

## 2017-08-02 ENCOUNTER — Other Ambulatory Visit: Payer: Self-pay | Admitting: Family Medicine

## 2017-08-02 MED ORDER — GLIPIZIDE 10 MG PO TABS: 10.0000 mg | ORAL_TABLET | Freq: Two times a day (BID) | ORAL | 0 refills | Status: DC

## 2017-08-02 MED ORDER — GLIPIZIDE 10 MG PO TABS: 10.0000 mg | ORAL_TABLET | Freq: Two times a day (BID) | ORAL | 11 refills | Status: DC

## 2017-08-08 ENCOUNTER — Telehealth: Payer: Self-pay | Admitting: Family Medicine

## 2017-08-08 MED ORDER — GLIPIZIDE 10 MG PO TABS: 10.0000 mg | ORAL_TABLET | Freq: Two times a day (BID) | ORAL | 2 refills | Status: DC

## 2017-08-08 NOTE — Telephone Encounter (Signed)
Copied from CRM [phone]. Topic: Quick Communication - Rx Refill/Question >> Aug 08, 2017 11:44 AM Louie BunPalacios Medina, Rosey Batheresa D wrote: Medication:glipiZIDE (GLUCOTROL) 10 MG tablet for 90 day supply   Has the patient contacted their pharmacy? Yes   (Agent: If no, request that the patient contact the pharmacy for the refill.)   Preferred Pharmacy (with phone number or street name): [facility] [place] - [phone] AT Portal to Registered [facility]   Agent: Please be advised that RX refills may take up to 3 business days. We ask that you follow-up with your pharmacy.

## 2017-08-09 ENCOUNTER — Encounter: Payer: Self-pay | Admitting: Family Medicine

## 2017-08-09 LAB — HM DIABETES EYE EXAM

## 2017-08-15 ENCOUNTER — Other Ambulatory Visit: Payer: Self-pay | Admitting: Family Medicine

## 2017-09-14 ENCOUNTER — Encounter: Payer: Self-pay | Admitting: Family Medicine

## 2017-09-14 ENCOUNTER — Other Ambulatory Visit: Payer: Self-pay

## 2017-09-17 ENCOUNTER — Telehealth: Payer: Self-pay

## 2017-09-17 MED ORDER — GLIPIZIDE 10 MG PO TABS: 10.0000 mg | ORAL_TABLET | Freq: Two times a day (BID) | ORAL | 3 refills | Status: DC

## 2017-09-17 NOTE — Telephone Encounter (Signed)
Rx for glipizide sent to [facility] as requested by pt.

## 2018-03-12 ENCOUNTER — Other Ambulatory Visit: Payer: Self-pay | Admitting: Family Medicine

## 2018-07-12 ENCOUNTER — Other Ambulatory Visit: Payer: Self-pay | Admitting: Family Medicine

## 2018-07-18 ENCOUNTER — Other Ambulatory Visit: Payer: Self-pay | Admitting: Family Medicine

## 2018-07-18 MED ORDER — METFORMIN HCL 1000 MG PO TABS: 1000.0000 mg | ORAL_TABLET | Freq: Two times a day (BID) | ORAL | 1 refills | Status: DC

## 2018-07-18 MED ORDER — ENALAPRIL MALEATE 10 MG PO TABS: 10.0000 mg | ORAL_TABLET | Freq: Every day | ORAL | 1 refills | Status: DC

## 2018-09-07 ENCOUNTER — Other Ambulatory Visit: Payer: Self-pay | Admitting: Family Medicine

## 2018-09-30 ENCOUNTER — Encounter: Payer: Self-pay | Admitting: *Deleted

## 2018-12-02 ENCOUNTER — Other Ambulatory Visit: Payer: Self-pay | Admitting: Family Medicine

## 2018-12-29 ENCOUNTER — Other Ambulatory Visit: Payer: Self-pay | Admitting: Family Medicine

## 2019-10-30 ENCOUNTER — Other Ambulatory Visit: Payer: Self-pay

## 2019-10-31 ENCOUNTER — Encounter: Payer: Self-pay | Admitting: Family Medicine

## 2019-10-31 ENCOUNTER — Ambulatory Visit (INDEPENDENT_AMBULATORY_CARE_PROVIDER_SITE_OTHER): Payer: 59 | Admitting: Family Medicine

## 2019-10-31 VITALS — BP 130/62 | HR 98 | Temp 97.2°F | Wt 181.4 lb

## 2019-10-31 DIAGNOSIS — E1140 Type 2 diabetes mellitus with diabetic neuropathy, unspecified: Secondary | ICD-10-CM | POA: Diagnosis not present

## 2019-10-31 DIAGNOSIS — N401 Enlarged prostate with lower urinary tract symptoms: Secondary | ICD-10-CM

## 2019-10-31 DIAGNOSIS — E119 Type 2 diabetes mellitus without complications: Secondary | ICD-10-CM

## 2019-10-31 DIAGNOSIS — F411 Generalized anxiety disorder: Secondary | ICD-10-CM | POA: Diagnosis not present

## 2019-10-31 DIAGNOSIS — N138 Other obstructive and reflux uropathy: Secondary | ICD-10-CM | POA: Diagnosis not present

## 2019-10-31 DIAGNOSIS — I10 Essential (primary) hypertension: Secondary | ICD-10-CM

## 2019-10-31 LAB — LIPID PANEL
Cholesterol: 138 mg/dL (ref 0–200)
HDL: 36.4 mg/dL — ABNORMAL LOW (ref >39.00)
LDL Cholesterol: 68 mg/dL (ref 0–99)
NonHDL: 101.73
Total CHOL/HDL Ratio: 4
Triglycerides: 169 mg/dL — ABNORMAL HIGH (ref 0.0–149.0)
VLDL: 33.8 mg/dL (ref 0.0–40.0)

## 2019-10-31 LAB — CBC WITH DIFFERENTIAL/PLATELET
Basophils Absolute: 0.1 10*3/uL (ref 0.0–0.1)
Basophils Relative: 1.2 % (ref 0.0–3.0)
Eosinophils Absolute: 0.2 10*3/uL (ref 0.0–0.7)
Eosinophils Relative: 2.7 % (ref 0.0–5.0)
HCT: 40.1 % (ref 39.0–52.0)
Hemoglobin: 14 g/dL (ref 13.0–17.0)
Lymphocytes Relative: 23.8 % (ref 12.0–46.0)
Lymphs Abs: 1.4 10*3/uL (ref 0.7–4.0)
MCHC: 34.8 g/dL (ref 30.0–36.0)
MCV: 92.9 fl (ref 78.0–100.0)
Monocytes Absolute: 0.4 10*3/uL (ref 0.1–1.0)
Monocytes Relative: 7.5 % (ref 3.0–12.0)
Neutro Abs: 3.7 10*3/uL (ref 1.4–7.7)
Neutrophils Relative %: 64.8 % (ref 43.0–77.0)
Platelets: 175 10*3/uL (ref 150.0–400.0)
RBC: 4.32 Mil/uL (ref 4.22–5.81)
RDW: 13.2 % (ref 11.5–15.5)
WBC: 5.7 10*3/uL (ref 4.0–10.5)

## 2019-10-31 LAB — BASIC METABOLIC PANEL
BUN: 14 mg/dL (ref 6–23)
CO2: 26 mEq/L (ref 19–32)
Calcium: 9.4 mg/dL (ref 8.4–10.5)
Chloride: 97 mEq/L (ref 96–112)
Creatinine, Ser: 0.85 mg/dL (ref 0.40–1.50)
GFR: 91.37 mL/min (ref >60.00)
Glucose, Bld: 412 mg/dL — ABNORMAL HIGH (ref 70–99)
Potassium: 5.4 mEq/L — ABNORMAL HIGH (ref 3.5–5.1)
Sodium: 132 mEq/L — ABNORMAL LOW (ref 135–145)

## 2019-10-31 LAB — HEPATIC FUNCTION PANEL
ALT: 15 U/L (ref 0–53)
AST: 13 U/L (ref 0–37)
Albumin: 4.1 g/dL (ref 3.5–5.2)
Alkaline Phosphatase: 105 U/L (ref 39–117)
Bilirubin, Direct: 0.2 mg/dL (ref 0.0–0.3)
Total Bilirubin: 1.1 mg/dL (ref 0.2–1.2)
Total Protein: 6.9 g/dL (ref 6.0–8.3)

## 2019-10-31 LAB — POCT GLYCOSYLATED HEMOGLOBIN (HGB A1C): Hemoglobin A1C: 13.4 % — AB (ref 4.0–5.6)

## 2019-10-31 LAB — VITAMIN B12: Vitamin B-12: 669 pg/mL (ref 211–911)

## 2019-10-31 LAB — TSH: TSH: 2.38 u[IU]/mL (ref 0.35–4.50)

## 2019-10-31 LAB — PSA: PSA: 0.21 ng/mL (ref 0.10–4.00)

## 2019-10-31 MED ORDER — METFORMIN HCL 1000 MG PO TABS: 1000.0000 mg | ORAL_TABLET | Freq: Two times a day (BID) | ORAL | 2 refills | Status: DC

## 2019-10-31 MED ORDER — ALPRAZOLAM 0.25 MG PO TABS: 0.2500 mg | ORAL_TABLET | Freq: Three times a day (TID) | ORAL | 2 refills | Status: DC | PRN

## 2019-10-31 MED ORDER — GABAPENTIN 300 MG PO CAPS: ORAL_CAPSULE | ORAL | 1 refills | Status: DC

## 2019-10-31 MED ORDER — GLIPIZIDE 10 MG PO TABS: 10.0000 mg | ORAL_TABLET | Freq: Two times a day (BID) | ORAL | 2 refills | Status: DC

## 2019-10-31 NOTE — Progress Notes (Signed)
   Subjective:    Patient ID: [name], male    DOB: [birth year], 64 y.o.   MRN: [id number]  HPI Here to follow up after a 2 years absence. He ran out of all his medications one year ago. He does not check his blood glucoses at all. He has been feeling bad for several months with generalized fatigue and and weakness in the legs. He fell twice going up some steps at his home recently and he says it was because his legs "gave out". No chest pain or SOB. He thinks he has lost some weight (he has lost 14 lbs since Feb 2019). His A1c today is 13.4. He asks to get back on Xanax for his anxiety, and he want to restart Gabapentin for his neuropathy. He still has numbness and tingling in the hands and feet.    Review of Systems  Constitutional: Positive for fatigue.  Respiratory: Negative.   Cardiovascular: Negative.   Neurological: Positive for weakness.       Objective:   Physical Exam Constitutional:      Appearance: Normal appearance. He is not ill-appearing.  Cardiovascular:     Rate and Rhythm: Normal rate and regular rhythm.     Pulses: Normal pulses.     Heart sounds: Normal heart sounds.  Pulmonary:     Effort: Pulmonary effort is normal.     Breath sounds: Normal breath sounds.  Neurological:     General: No focal deficit present.     Mental Status: He is alert and oriented to person, place, and time.     Motor: No weakness.     Coordination: Coordination normal.     Gait: Gait normal.           Assessment & Plan:  His diabetes is out of control, and no doubt this is why he has been feeling bad. We will get back on his former regimen of Glipizide and Metformin. He is fasting today so we will get more complete labs. He will return in 2 weeks for a well exam and to follow up on the diabetes. His BP is normal so we will not go back on Enalapril. Refilled Xanax and Gabapentin. [name], MD

## 2019-11-21 ENCOUNTER — Encounter: Payer: Self-pay | Admitting: Family Medicine

## 2019-11-21 ENCOUNTER — Other Ambulatory Visit: Payer: Self-pay

## 2019-11-21 ENCOUNTER — Ambulatory Visit: Payer: No Typology Code available for payment source | Admitting: Family Medicine

## 2019-11-21 VITALS — BP 140/60 | HR 94 | Temp 97.3°F | Wt 187.2 lb

## 2019-11-21 DIAGNOSIS — E119 Type 2 diabetes mellitus without complications: Secondary | ICD-10-CM

## 2019-11-21 LAB — GLUCOSE, POCT (MANUAL RESULT ENTRY): POC Glucose: 215 mg/dl — AB (ref 70–99)

## 2019-11-21 NOTE — Progress Notes (Signed)
   Subjective:    Patient ID: [name], male    DOB: 11-Feb-1956, 64 y.o.   MRN: [id number]  HPI Here to follow up on diabetes. Several weeks ago we saw him for the first time on 2 years and he was feeling very weak and had other issues. He had not been treating his diabetes at all, and his A1c was 13.4. Other complete lab work was unremarkable. He was started on Metformin 1000 mg BID and Glipizide 10 mg BID, and now he feels much better. He does have frequent loose stools however. He does not check his glucoses at home because his meter is broken. This morning his fasting glucose is 215.    Review of Systems  Constitutional: Negative.   Respiratory: Negative.   Cardiovascular: Negative.   Neurological: Negative.        Objective:   Physical Exam Constitutional:      Appearance: Normal appearance.  Cardiovascular:     Rate and Rhythm: Normal rate and regular rhythm.     Pulses: Normal pulses.     Heart sounds: Normal heart sounds.  Pulmonary:     Effort: Pulmonary effort is normal.     Breath sounds: Normal breath sounds.  Neurological:     Mental Status: He is alert.           Assessment & Plan:  Type 2 diabetes. This has improved but he still has a way to go. Due to side effects he will decrease the Metformin to 1/2 tablet (500 mg) BID. Refer to Nutrition. Recheck in 3 months.  [name], MD

## 2020-01-06 ENCOUNTER — Telehealth: Payer: Self-pay | Admitting: Family Medicine

## 2020-01-06 ENCOUNTER — Encounter: Payer: Self-pay | Admitting: Family Medicine

## 2020-01-06 NOTE — Telephone Encounter (Signed)
Spoke witht he patient. Relayed Dr. Claris Che message below. The patient stated that he would just go to urgent care. Nothing further needed.

## 2020-01-06 NOTE — Telephone Encounter (Signed)
Pt call and want a call back today to  talk about get something for  his sleeping problem  Before he make a appt.

## 2020-01-06 NOTE — Telephone Encounter (Signed)
He will need an OV for [place] to discuss this

## 2020-01-07 ENCOUNTER — Encounter: Payer: Self-pay | Admitting: Family Medicine

## 2020-01-07 ENCOUNTER — Other Ambulatory Visit: Payer: Self-pay

## 2020-01-07 ENCOUNTER — Ambulatory Visit: Payer: No Typology Code available for payment source | Admitting: Family Medicine

## 2020-01-07 VITALS — BP 100/60 | HR 124 | Temp 98.0°F | Wt 177.2 lb

## 2020-01-07 DIAGNOSIS — I10 Essential (primary) hypertension: Secondary | ICD-10-CM | POA: Diagnosis not present

## 2020-01-07 DIAGNOSIS — G4700 Insomnia, unspecified: Secondary | ICD-10-CM | POA: Diagnosis not present

## 2020-01-07 DIAGNOSIS — E1140 Type 2 diabetes mellitus with diabetic neuropathy, unspecified: Secondary | ICD-10-CM

## 2020-01-07 MED ORDER — TEMAZEPAM 30 MG PO CAPS
30.0000 mg | ORAL_CAPSULE | Freq: Every evening | ORAL | 0 refills | Status: DC | PRN
Start: 2020-01-07 — End: 2020-01-23

## 2020-01-07 NOTE — Progress Notes (Signed)
   Subjective:    Patient ID: Marzetta Board, male    DOB: Nov 22, 1955, 64 y.o.   MRN: [id number]  HPI Here for several issues. First he says his BP has been dropping at home for the past week. He sometimes gets readings of 70s over 40s, even though he has felt fine. He notes that his home cuff is a wrist cuff. Second he says he has weaned himself off Gabapentin because it made his arms and legs jerk. He asks for a referral to see a neurologist for the neuropathy. Third he has not been able to sleep well for a month, and he says he lies in bed and thinks about work related issues. This has made him extremely tired and he cannot function well, so he asks for some time off work.    Review of Systems  Constitutional: Positive for fatigue.  Respiratory: Negative.   Cardiovascular: Negative.   Neurological: Positive for numbness.  Psychiatric/Behavioral: Positive for sleep disturbance. Negative for dysphoric mood. The patient is not nervous/anxious.        Objective:   Physical Exam Constitutional:      Appearance: Normal appearance. He is not ill-appearing.  Cardiovascular:     Rate and Rhythm: Normal rate and regular rhythm.     Pulses: Normal pulses.     Heart sounds: Normal heart sounds.  Pulmonary:     Effort: Pulmonary effort is normal.     Breath sounds: Normal breath sounds.  Neurological:     General: No focal deficit present.     Mental Status: He is alert and oriented to person, place, and time.           Assessment & Plan:  I think his BP has been normal, but I told him that wrist cuffs are not very accurate. I advised him to get an arm cuff instead and he agreed. For the neuropathy, we will refer him to Neurology. For sleep he will try Temazepam. We will write him out of work today through 01-11-20.  [name], MD

## 2020-01-10 ENCOUNTER — Emergency Department (HOSPITAL_COMMUNITY)
Admission: EM | Admit: 2020-01-10 | Discharge: 2020-01-10 | Disposition: A | Discharge status: Home / Self Care | Payer: No Typology Code available for payment source | Attending: Emergency Medicine | Admitting: Emergency Medicine

## 2020-01-10 ENCOUNTER — Other Ambulatory Visit: Payer: Self-pay

## 2020-01-10 ENCOUNTER — Emergency Department (HOSPITAL_COMMUNITY): Payer: No Typology Code available for payment source

## 2020-01-10 ENCOUNTER — Ambulatory Visit (INDEPENDENT_AMBULATORY_CARE_PROVIDER_SITE_OTHER)
Admission: EM | Admit: 2020-01-10 | Discharge: 2020-01-10 | Disposition: A | Discharge status: Nursing Home (Includes ICF) | Payer: No Typology Code available for payment source | Source: Home / Self Care

## 2020-01-10 ENCOUNTER — Encounter (HOSPITAL_COMMUNITY): Payer: Self-pay

## 2020-01-10 DIAGNOSIS — R42 Dizziness and giddiness: Secondary | ICD-10-CM | POA: Diagnosis not present

## 2020-01-10 DIAGNOSIS — Z87891 Personal history of nicotine dependence: Secondary | ICD-10-CM | POA: Insufficient documentation

## 2020-01-10 DIAGNOSIS — Z20822 Contact with and (suspected) exposure to covid-19: Secondary | ICD-10-CM | POA: Diagnosis not present

## 2020-01-10 DIAGNOSIS — Z7984 Long term (current) use of oral hypoglycemic drugs: Secondary | ICD-10-CM | POA: Insufficient documentation

## 2020-01-10 DIAGNOSIS — I10 Essential (primary) hypertension: Secondary | ICD-10-CM | POA: Insufficient documentation

## 2020-01-10 DIAGNOSIS — R531 Weakness: Secondary | ICD-10-CM | POA: Insufficient documentation

## 2020-01-10 DIAGNOSIS — R5383 Other fatigue: Secondary | ICD-10-CM | POA: Diagnosis not present

## 2020-01-10 DIAGNOSIS — R0600 Dyspnea, unspecified: Secondary | ICD-10-CM | POA: Insufficient documentation

## 2020-01-10 DIAGNOSIS — E1140 Type 2 diabetes mellitus with diabetic neuropathy, unspecified: Secondary | ICD-10-CM | POA: Diagnosis not present

## 2020-01-10 LAB — URINALYSIS, ROUTINE W REFLEX MICROSCOPIC
Bacteria, UA: NONE SEEN
Bilirubin Urine: NEGATIVE
Glucose, UA: >=500 mg/dL — AB
Hgb urine dipstick: NEGATIVE
Ketones, ur: NEGATIVE mg/dL
Leukocytes,Ua: NEGATIVE
Nitrite: NEGATIVE
Protein, ur: NEGATIVE mg/dL
Specific Gravity, Urine: 1.022 (ref 1.005–1.030)
pH: 5 (ref 5.0–8.0)

## 2020-01-10 LAB — CBC
HCT: 42.5 % (ref 39.0–52.0)
Hemoglobin: 14.6 g/dL (ref 13.0–17.0)
MCH: 30.7 pg (ref 26.0–34.0)
MCHC: 34.4 g/dL (ref 30.0–36.0)
MCV: 89.5 fL (ref 80.0–100.0)
Platelets: 210 10*3/uL (ref 150–400)
RBC: 4.75 MIL/uL (ref 4.22–5.81)
RDW: 12 % (ref 11.5–15.5)
WBC: 8.1 10*3/uL (ref 4.0–10.5)
nRBC: 0 % (ref 0.0–0.2)

## 2020-01-10 LAB — BASIC METABOLIC PANEL
Anion gap: 10 (ref 5–15)
BUN: 26 mg/dL — ABNORMAL HIGH (ref 8–23)
CO2: 24 mmol/L (ref 22–32)
Calcium: 9.4 mg/dL (ref 8.9–10.3)
Chloride: 99 mmol/L (ref 98–111)
Creatinine, Ser: 1.11 mg/dL (ref 0.61–1.24)
GFR calc Af Amer: >60 mL/min (ref >60)
GFR calc non Af Amer: >60 mL/min (ref >60)
Glucose, Bld: 380 mg/dL — ABNORMAL HIGH (ref 70–99)
Potassium: 4.4 mmol/L (ref 3.5–5.1)
Sodium: 133 mmol/L — ABNORMAL LOW (ref 135–145)

## 2020-01-10 LAB — TROPONIN I (HIGH SENSITIVITY): Troponin I (High Sensitivity): 4 ng/L (ref <18)

## 2020-01-10 LAB — CBG MONITORING, ED: Glucose-Capillary: 406 mg/dL — ABNORMAL HIGH (ref 70–99)

## 2020-01-10 LAB — SARS CORONAVIRUS 2 BY RT PCR (HOSPITAL ORDER, PERFORMED IN ~~LOC~~ HOSPITAL LAB): SARS Coronavirus 2: NEGATIVE

## 2020-01-10 MED ORDER — SODIUM CHLORIDE 0.9 % IV BOLUS: 1000.0000 mL | Freq: Once | INTRAVENOUS | Status: DC

## 2020-01-10 MED ORDER — SODIUM CHLORIDE 0.9 % IV BOLUS
500.0000 mL | Freq: Once | INTRAVENOUS | Status: AC
  Administered 2020-01-10: 500 mL via INTRAVENOUS

## 2020-01-10 NOTE — ED Notes (Signed)
Pt staes he going outside to get air from waiting room

## 2020-01-10 NOTE — ED Notes (Signed)
EKG shown to S. Ashley Royalty, NP.  Pt to go to ED.

## 2020-01-10 NOTE — ED Notes (Signed)
Called pt x3 for vital recheck no answer 

## 2020-01-10 NOTE — ED Notes (Signed)
Patient verbalizes understanding of discharge instructions. Opportunity for questioning and answers were provided. Armband removed by staff, pt discharged from ED ambulatory.   

## 2020-01-10 NOTE — ED Notes (Signed)
Patient is being discharged from the Urgent Care and sent to the Emergency Department via private vehicle . Per Moshe Cipro NP, patient is in need of higher level of care due to tachy cardia and High blood surger and dizziness . Patient is aware and verbalizes understanding of plan of care.  Vitals:   01/10/20 1032  BP: (!) 108/60  Pulse: (!) 122  SpO2: 100%

## 2020-01-10 NOTE — ED Triage Notes (Signed)
Pt sent here from [facility] for further eval of shortness of breath and lightheadedness and weakness on exertion x 2-3 weeks. Endorses feeling like his heart is racing sometimes.

## 2020-01-10 NOTE — Discharge Instructions (Signed)
Please return for any problem.  Follow-up with your regular care providers as instructed.  Follow-up with cardiology as instructed.  If you experience worsening shortness of breath, chest pain, or other emergency please call 911.

## 2020-01-10 NOTE — ED Triage Notes (Signed)
Pt present fatigue with dizziness, symptoms started two weeks ago. Pt states that sometimes he feels like he is about to pass out.

## 2020-01-10 NOTE — ED Provider Notes (Signed)
[facility] EMERGENCY DEPARTMENT Provider Note   CSN: [id number] Arrival date & time: 01/10/20  1050     History Chief Complaint  Patient presents with   Shortness of Breath   Weakness    [name] is a 64 y.o. male.  64 year old male with prior medical history as detailed below presents for evaluation of dyspnea with exertion.  Patient reports transient and intermittent dyspnea with exertion over the last 1 to 2 weeks.  Patient denies associated chest pain.  [name] denies associated fever.  [name] reports feeling comfortable at rest.  [name] reports that with heavy exertion [name] feels fatigued.    The history is provided by the patient.  Shortness of Breath Severity:  Moderate Onset quality:  Gradual Duration:  2 weeks Timing:  Intermittent Progression:  Waxing and waning Chronicity:  New Context: activity   Relieved by:  Nothing Worsened by:  Nothing Associated symptoms: no fever   Weakness Associated symptoms: shortness of breath   Associated symptoms: no fever        Past Medical History:  Diagnosis Date   Diabetes mellitus    Hematospermia    Hypertension    Nephrolithiasis 02/07/07   Viral meningitis     Patient Active Problem List   Diagnosis Date Noted   Insomnia 01/07/2020   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 2 diabetes mellitus (HCC) 07/27/2017   Generalized anxiety disorder 07/27/2017   Chronic right flank pain 12/20/2015   Hyperkalemia 10/21/2014   Chest pain 09/21/2014   ADHD 06/22/2010   ERECTILE DYSFUNCTION, 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 Rayburn Ma        Family History  Problem Relation Age of Onset   Arthritis Unknown    Hypertension Unknown    Stroke Unknown      Coronary artery disease Unknown    Rheum arthritis Mother    Diabetes Father    Heart disease Father    Cholecystitis Sister    Cholecystitis Brother    Birth defects Neg Hx    Diabetes Brother    Diabetes Brother    Parkinson's disease Brother    Heart disease Sister    Diabetes Sister    Cholecystitis Sister    Diabetes Sister     Social History   Tobacco Use   Smoking status: Former Smoker   Smokeless tobacco: Never Used   Tobacco comment: 22 yrs ago   Substance Use Topics   Alcohol use: No    Alcohol/week: 0.0 standard drinks   Drug use: No    Home Medications Prior to Admission medications   Medication Sig Start Date End Date Taking? Authorizing Provider  ALPRAZolam (XANAX) 0.25 MG tablet Take 1 tablet (0.25 mg total) by mouth 3 (three) times daily as needed for anxiety. 10/31/19   Nelwyn Salisbury, MD  B Complex Vitamins (B COMPLEX 1 PO) Take by mouth.    [provider]  glipiZIDE (GLUCOTROL) 10 MG tablet Take 1 tablet (10 mg total) by mouth 2 (two) times daily before a meal. 10/31/19   Nelwyn Salisbury, MD  metFORMIN (GLUCOPHAGE) 1000 MG tablet Take 1 tablet (1,000 mg total) by mouth 2 (two) times daily with a meal. 10/31/19   Nelwyn Salisbury, MD  temazepam (RESTORIL)  30 MG capsule Take 1 capsule (30 mg total) by mouth at bedtime as needed for sleep. 01/07/20   Nelwyn Salisbury, MD    Allergies    Patient has no known allergies.  Review of Systems   Review of Systems  Constitutional: Negative for fever.  Respiratory: Positive for shortness of breath.   Neurological: Positive for weakness.  All other systems reviewed and are negative.   Physical Exam Updated Vital Signs BP (!) 134/74    Pulse 98    Temp 99.1 F (37.3 C) (Oral)    Resp 15    Ht 5\' 7"  (1.702 m)    Wt 79.4 kg    SpO2 100%    BMI 27.41 kg/m   Physical Exam Vitals and nursing note reviewed.  Constitutional:      General: [name] is not in acute distress.    Appearance: [name] is  well-developed.  HENT:     Head: Normocephalic and atraumatic.  Eyes:     Conjunctiva/sclera: Conjunctivae normal.     Pupils: Pupils are equal, round, and reactive to light.  Cardiovascular:     Rate and Rhythm: Normal rate and regular rhythm.     Heart sounds: Normal heart sounds.  Pulmonary:     Effort: Pulmonary effort is normal. No respiratory distress.     Breath sounds: Normal breath sounds.  Abdominal:     General: There is no distension.     Palpations: Abdomen is soft.     Tenderness: There is no abdominal tenderness.  Musculoskeletal:        General: No deformity. Normal range of motion.     Cervical back: Normal range of motion and neck supple.  Skin:    General: Skin is warm and dry.  Neurological:     General: No focal deficit present.     Mental Status: [name] is alert and oriented to person, place, and time.     ED Results / Procedures / Treatments   Labs (all labs ordered are listed, but only abnormal results are displayed) Labs Reviewed  BASIC METABOLIC PANEL - Abnormal; Notable for the following components:      Result Value   Sodium 133 (*)    Glucose, Bld 380 (*)    BUN 26 (*)    All other components within normal limits  URINALYSIS, ROUTINE W REFLEX MICROSCOPIC - Abnormal; Notable for the following components:   APPearance HAZY (*)    Glucose, UA >=500 (*)    All other components within normal limits  SARS CORONAVIRUS 2 BY RT PCR (HOSPITAL ORDER, PERFORMED IN [facility])  CBC  TROPONIN I (HIGH SENSITIVITY)  TROPONIN I (HIGH SENSITIVITY)    EKG EKG Interpretation  Date/Time:  Saturday January 10 2020 11:03:03 EDT Ventricular Rate:  122 PR Interval:  176 QRS Duration: 68 QT Interval:  294 QTC Calculation: 418 R Axis:   58 Text Interpretation: Sinus tachycardia Right atrial enlargement Borderline ECG Confirmed by 12-06-1989 [phone]) on 01/10/2020 5:46:38 PM   Radiology DG Chest 2 View  Result Date: 01/10/2020 CLINICAL DATA:   Shortness of breath and lightheadedness. Dizziness for several days. EXAM: CHEST - 2 VIEW COMPARISON:  12/15/2014 FINDINGS: The heart size and mediastinal contours are within normal limits. Both lungs are clear. The visualized skeletal structures are unremarkable. IMPRESSION: No active cardiopulmonary disease. Electronically Signed   By: 12/17/2014 M.D.   On: 01/10/2020 11:48    Procedures Procedures (including critical care time)  Medications Ordered in ED Medications  sodium chloride 0.9 % bolus 500 mL (500 mLs Intravenous New Bag/Given 01/10/20 1846)    ED Course  I have reviewed the triage vital signs and the nursing notes.  Pertinent labs & imaging results that were available during my care of the patient were reviewed by me and considered in my medical decision making (see chart for details).      MDM Rules/Calculators/A&P                          MDM  Screen complete  [name] was evaluated in Emergency Department on 01/10/2020 for the symptoms described in the history of present illness. [name] was evaluated in the context of the global COVID-19 pandemic, which necessitated consideration that the patient might be at risk for infection with the SARS-CoV-2 virus that causes COVID-19. Institutional protocols and algorithms that pertain to the evaluation of patients at risk for COVID-19 are in a state of rapid change based on information released by regulatory bodies including the CDC and federal and state organizations. These policies and algorithms were followed during the patient's care in the ED.   Patient is presenting for evaluation of reported dyspnea with exertion.  This appears to be a longstanding issue over the last 2 to 3 weeks.  Patient denies associated chest discomfort.  [name] denies acute onset of his symptoms.  Patient's initial work-up in the ED is without significant abnormality.  EKG did show a sinus tach initially.  This resolved without treatment.  Patient  reports to me that [name] has a longstanding history of a "fast heartbeat."  Patient's troponin was 4 on initial draw.  Patient refused repeat troponin check.  Patient refuses further ED evaluation or treatment.  Patient's other labs were without significant acute abnormality.  Patient was offered admission.  [name] declined same.  Patient does understand that failure to diagnose cardiac or other significant condition could result in permanent disability or death.  [name] prefers to be discharged.  [name] has capacity to refuse care.  The discussion as to whether discharge or admission would be appropriate was conducted with the patient's wife at bedside.  She also understands the risks involved in leaving the hospital.  [name] will arrange close follow-up with his regular care provider and also with cardiology as suggested.   Final Clinical Impression(s) / ED Diagnoses Final diagnoses:  Dyspnea, unspecified type    Rx / DC Orders ED Discharge Orders    None       Wynetta Fines, MD 01/10/20 2038

## 2020-01-12 ENCOUNTER — Encounter: Payer: Self-pay | Admitting: Family Medicine

## 2020-01-13 NOTE — Telephone Encounter (Signed)
Set up an OV with me for hospital follow up so we can review everything that was done

## 2020-01-14 ENCOUNTER — Encounter: Payer: Self-pay | Admitting: Family Medicine

## 2020-01-14 ENCOUNTER — Other Ambulatory Visit: Payer: Self-pay

## 2020-01-14 ENCOUNTER — Ambulatory Visit: Payer: No Typology Code available for payment source | Admitting: Family Medicine

## 2020-01-14 VITALS — BP 122/60 | HR 125 | Temp 98.1°F | Wt 177.2 lb

## 2020-01-14 DIAGNOSIS — E1140 Type 2 diabetes mellitus with diabetic neuropathy, unspecified: Secondary | ICD-10-CM

## 2020-01-14 DIAGNOSIS — R000 Tachycardia, unspecified: Secondary | ICD-10-CM | POA: Insufficient documentation

## 2020-01-14 DIAGNOSIS — I10 Essential (primary) hypertension: Secondary | ICD-10-CM | POA: Diagnosis not present

## 2020-01-14 MED ORDER — METOPROLOL SUCCINATE ER 25 MG PO TB24: 25.0000 mg | ORAL_TABLET | Freq: Every day | ORAL | 2 refills | Status: DC

## 2020-01-14 NOTE — Progress Notes (Signed)
   Subjective:    Patient ID: [name], male    DOB: 1955-12-17, 64 y.o.   MRN: [id number]  HPI Here to follow up an ER visit on 01-10-20 for SOB in exertion. At the ER his BP was normal but his rate was high at 122. Labs were all normal including a troponin. CXR was clear. His EKG showed sinus tachycardia. He was told to follow up with [place]. Since then he still describes lightheadedness and SOB if he walks more than short distances. No chest pain. We recently saw him for neuropathy and he will see Dr. Karel Jarvis on 02-27-20 for this.    Review of Systems  Constitutional: Negative.   Respiratory: Positive for shortness of breath. Negative for cough, chest tightness and wheezing.   Cardiovascular: Negative.   Neurological: Positive for light-headedness. Negative for dizziness and headaches.       Objective:   Physical Exam Constitutional:      General: He is not in acute distress.    Appearance: Normal appearance.  Cardiovascular:     Rate and Rhythm: Regular rhythm. Tachycardia present.     Pulses: Normal pulses.     Heart sounds: Normal heart sounds.  Pulmonary:     Effort: Pulmonary effort is normal.     Breath sounds: Normal breath sounds.  Neurological:     Mental Status: He is alert.           Assessment & Plan:  Sinus tachycardia. We will start him on Metoprolol succinate 25 mg daily. Refer to Cardiology. [name], MD

## 2020-01-19 ENCOUNTER — Encounter: Payer: Self-pay | Admitting: Family Medicine

## 2020-01-20 NOTE — Telephone Encounter (Signed)
Yes we can help with any paper work he needs

## 2020-01-22 ENCOUNTER — Other Ambulatory Visit: Payer: Self-pay | Admitting: Family Medicine

## 2020-01-22 NOTE — Telephone Encounter (Signed)
Patient is scheduled with Dr. Clent Ridges on 01/23/2020 at 11 AM

## 2020-01-23 ENCOUNTER — Other Ambulatory Visit: Payer: Self-pay

## 2020-01-23 ENCOUNTER — Ambulatory Visit: Payer: No Typology Code available for payment source | Admitting: Family Medicine

## 2020-01-23 ENCOUNTER — Encounter: Payer: Self-pay | Admitting: Family Medicine

## 2020-01-23 VITALS — BP 110/60 | HR 100 | Temp 98.0°F | Ht 67.0 in | Wt 176.2 lb

## 2020-01-23 DIAGNOSIS — R000 Tachycardia, unspecified: Secondary | ICD-10-CM | POA: Diagnosis not present

## 2020-01-23 MED ORDER — TEMAZEPAM 30 MG PO CAPS
30.0000 mg | ORAL_CAPSULE | Freq: Every evening | ORAL | 5 refills | Status: DC | PRN
Start: 2020-01-23 — End: 2020-08-03

## 2020-01-23 NOTE — Progress Notes (Signed)
   Subjective:    Patient ID: [name], male    DOB: 03-Dec-1955, 64 y.o.   MRN: [id number]  HPI Here with his wife to follow up on sinus tachycardia and to fill out FMLA paperwork. He has been taking Metoprolol succinate 25 mg daily for about a week, and his resting heart rate has come down from the 120s and 130s to 100-110. He still feels weak and he gets SOB with little exertion. No chest pain. He has been out of work since 01-19-20.    Review of Systems  Constitutional: Negative.   Respiratory: Positive for shortness of breath. Negative for cough and wheezing.   Cardiovascular: Negative.   Neurological: Positive for light-headedness. Negative for dizziness and headaches.       Objective:   Physical Exam Constitutional:      General: He is not in acute distress.    Appearance: Normal appearance.  Cardiovascular:     Rate and Rhythm: Regular rhythm. Tachycardia present.     Pulses: Normal pulses.     Heart sounds: Normal heart sounds.  Pulmonary:     Effort: Pulmonary effort is normal.     Breath sounds: Normal breath sounds.  Neurological:     Mental Status: He is alert.           Assessment & Plan:  Sinus tachycardia. He seems to be stable for now. We will put him out of work until 02-16-20. He is scheduled to see Dr. Julien Nordmann (Cardioogy) on 02-06-20. [name], MD

## 2020-01-28 ENCOUNTER — Encounter: Payer: Self-pay | Admitting: Family Medicine

## 2020-01-30 NOTE — Telephone Encounter (Signed)
Noted  

## 2020-02-04 NOTE — Progress Notes (Signed)
Cardiology Office Note  Date:  02/06/2020   ID:  Gerald, [name] 21-Jan-1956, MRN [id number]  PCP:  Gerald Morale, MD   Chief Complaint  Patient presents with  . New patient referral    Dizziness and tachycardia    HPI:  [name] is a 64 year old gentleman with past medical history of Diabetes type 2 with neuropathy, poorly controlled,  hemoglobin A1c 13 Rapid weight loss over the past several years weight 295 down to 171 Who presents per referral from Dr. Alysia [name] for consultation of his sinus tachycardia  Recently seen by Dr. Sarajane [name] 01/23/2020, At that time heart rate 100 bpm Note indicate he is taking metoprolol succinate twenty-five daily for about 1 week, Resting heart rate down from 120-130 Some shortness of breath with exertion At work since beginning of August for symptoms, not until the end of August  No echocardiogram available  CT abdomen pelvis 2017 with aortic atherosclerosis  Recent chest x-ray 01/10/2020, no acute findings  Notes reviewed, recently in the emergency room 01/10/2020 for shortness of breath on exertion Heart rate elevated at that time 122 bpm, cardiac enzymes negative EKG sinus tach In the emergency room reported fatigue with heavy exertion, shortness of breath over several weeks  At work does IT, Has some heavy lifting,  SOB, can't walk, has to go slow, progressing ("this year") Poor sleep/insomnia   Been staying in the house this summer Losing weight, down 10 pounds in 3-4 months Missing meals, has to lay down, tired 297 pounds several years ago,   Total chol 138, LDL 68 HBA1C 13.4 (no endocrine)  Drinking pedialite with water, not much fluids in general  EKG personally reviewed by myself on todays visit Sinus tachycardia rate 106 bpm  Orthostatics done in the office Supine rate 106 blood pressure 103/70 Sitting rate 114, pressure 88/61 lightheaded Standing rate 118 blood pressure 86/59 lightheaded and short of  breath  -[place] given in the office Has not taken his metoprolol yet  PMH:   has a past medical history of Diabetes mellitus, Hematospermia, Hypertension, Nephrolithiasis (02/07/07), and Viral meningitis.  PSH:    Past Surgical History:  Procedure Laterality Date  . CHOLECYSTECTOMY  12-07-14   laparoscopic, per Dr. Rush [name]     Current Outpatient Medications  Medication Sig Dispense Refill  . ACCU-CHEK GUIDE test strip USE AS DIRECTED EVERY DAY    . Accu-Chek Softclix Lancets lancets daily.    Marland Kitchen ALPRAZolam (XANAX) 0.25 MG tablet Take 1 tablet (0.25 mg total) by mouth 3 (three) times daily as needed for anxiety. 90 tablet 2  . B Complex Vitamins (B COMPLEX 1 PO) Take by mouth.    . Blood Glucose Monitoring Suppl (ACCU-CHEK GUIDE ME) w/Device KIT USE AS DIRECTED EVERY DAY    . glipiZIDE (GLUCOTROL) 10 MG tablet TAKE 1 TABLET (10 MG TOTAL) BY MOUTH 2 (TWO) TIMES DAILY BEFORE A MEAL. 180 tablet 1  . metFORMIN (GLUCOPHAGE) 1000 MG tablet Take 0.5 tablet (500 mg) by mouth twice daily    . metoprolol succinate (TOPROL-XL) 25 MG 24 hr tablet Take 1 tablet (25 mg total) by mouth daily. 30 tablet 2  . temazepam (RESTORIL) 30 MG capsule Take 1 capsule (30 mg total) by mouth at bedtime as needed for sleep. 30 capsule 5   No current facility-administered medications for this visit.     Allergies:   Patient has no known allergies.   Social History:  The patient  reports that he has  quit smoking. He has never used smokeless tobacco. He reports that he does not drink alcohol and does not use drugs.   Family History:   family history includes Arthritis in his unknown relative; Cholecystitis in his brother, sister, and sister; Coronary artery disease in his unknown relative; Diabetes in his brother, brother, father, sister, and sister; Heart disease in his father and sister; Hypertension in his unknown relative; Parkinson's disease in his brother; Rheum arthritis in his mother; Stroke in his unknown  relative.    Review of Systems: Review of Systems  Constitutional: Positive for malaise/fatigue.  HENT: Negative.   Respiratory: Positive for shortness of breath.   Cardiovascular: Negative.   Gastrointestinal: Negative.   Musculoskeletal: Negative.   Neurological: Negative.   Psychiatric/Behavioral: Negative.   All other systems reviewed and are negative.   PHYSICAL EXAM: VS:  BP (!) 80/60   Pulse (!) 102   Ht 5\' 8"  (1.727 m)   Wt 171 lb 6 oz (77.7 kg)   BMI 26.06 kg/m  , BMI Body mass index is 26.06 kg/m. GEN: Well nourished, well developed, in no acute distress HEENT: normal Neck: no JVD, carotid bruits, or masses Cardiac: Tachycardic, regular, ; no murmurs, rubs, or gallops,no edema  Respiratory:  clear to auscultation bilaterally, normal work of breathing GI: soft, nontender, nondistended, + BS MS: no deformity or atrophy Skin: warm and dry, no rash Neuro:  Strength and sensation are intact Psych: euthymic mood, full affect   Recent Labs: 10/31/2019: ALT 15; TSH 2.38 01/10/2020: BUN 26; Creatinine, Ser 1.11; Hemoglobin 14.6; Platelets 210; Potassium 4.4; Sodium 133    Lipid Panel Lab Results  Component Value Date   CHOL 138 10/31/2019   HDL 36.40 (L) 10/31/2019   LDLCALC 68 10/31/2019   TRIG 169.0 (H) 10/31/2019      Wt Readings from Last 3 Encounters:  02/06/20 171 lb 6 oz (77.7 kg)  01/23/20 176 lb 4 oz (79.9 kg)  01/14/20 177 lb 3.2 oz (80.4 kg)       ASSESSMENT AND PLAN:  Problem List Items Addressed This Visit    Sinus tachycardia   Diabetes mellitus without complication (HCC) - Primary   Relevant Medications   metFORMIN (GLUCOPHAGE) 1000 MG tablet   MORBID OBESITY   Relevant Medications   metFORMIN (GLUCOPHAGE) 1000 MG tablet    Other Visit Diagnoses    Shortness of breath         Etiology of the sinus tachycardia is unclear, Differential includes dehydration in the setting of poorly controlled diabetes, hemoglobin A1c greater than  13 -Also need to consider dramatic weight loss over the past several years to 95 down to 171 today, down 10 pounds since May, missing meals, not drinking much fluids -Thyroid is normal -Unable to exclude cardiomyopathy, we will schedule echocardiogram He does have risk factors for cardiac disease including poorly controlled diabetes, hyperlipidemia We have called the hospital, looked into our clinic schedule, will work with him to get him in for echo in the next several days -If normal ejection fraction, potentially could support his blood pressure with midodrine for symptom relief while he works on his sugars, increase fluid intake -Cardiomyopathy would be treated with ischemic work-up  Poorly controlled diabetes with complications Reports that he has stopped working with endocrine Given dramatic weight loss, no improvement in his sugars, Recommend he be consider his treatment options Suggested he work closely with primary care  Orthostasis Etiology unclear, possibly prerenal, has not been drinking much, A1c  13 which may also be contributing.  Encouraged him to increase his fluid intake, echocardiogram as above   Disposition:   F/U 2 weeks   Total encounter time more than 60 minutes  Greater than 50% was spent in counseling and coordination of care with the patient  Patient was seen in consultation for Dr. Alysia [name] be referred back to his office for ongoing care of the issues detailed above  Signed, Esmond Plants, M.D., Ph.D. [place], [place]

## 2020-02-06 ENCOUNTER — Other Ambulatory Visit: Payer: Self-pay

## 2020-02-06 ENCOUNTER — Ambulatory Visit: Payer: No Typology Code available for payment source | Admitting: Cardiovascular Disease

## 2020-02-06 VITALS — BP 80/60 | HR 102 | Ht 68.0 in | Wt 171.4 lb

## 2020-02-06 DIAGNOSIS — R000 Tachycardia, unspecified: Secondary | ICD-10-CM

## 2020-02-06 DIAGNOSIS — R0602 Shortness of breath: Secondary | ICD-10-CM | POA: Diagnosis not present

## 2020-02-06 DIAGNOSIS — E119 Type 2 diabetes mellitus without complications: Secondary | ICD-10-CM

## 2020-02-06 NOTE — Patient Instructions (Addendum)
Push fluids, Don't miss meals  Medication Instructions:  No changes  If you need a refill on your cardiac medications before your next appointment, please call your pharmacy.    Lab work: No new labs needed   If you have labs (blood work) drawn today and your tests are completely normal, you will receive your results only by: Marland Kitchen MyChart Message (if you have MyChart) OR . A paper copy in the mail If you have any lab test that is abnormal or we need to change your treatment, we will call you to review the results.   Testing/Procedures: Your physician has requested that you have an echocardiogram (for dizziness, orthostasis, tachycardia). Echocardiography is a painless test that uses sound waves to create images of your heart. It provides your doctor with information about the size and shape of your heart and how well your heart's chambers and valves are working. This procedure takes approximately one hour. There are no restrictions for this procedure. An IV may need to be started during your exam to inject an image enhancing agent for more optimal pictures of your heart. Please drink some water prior to coming to your appointment.    Follow-Up: At [facility], you and your health needs are our priority.  As part of our continuing mission to provide you with exceptional heart care, we have created designated Provider Care Teams.  These Care Teams include your primary Cardiologist (physician) and Advanced Practice Providers (APPs -  Physician Assistants and Nurse Practitioners) who all work together to provide you with the care you need, when you need it.  You will need a follow up appointment 2  weeks   . Providers on your designated Care Team:   . Nicolasa Ducking, NP . Eula Listen, PA-C . Marisue Ivan, PA-C  Any Other Special Instructions Will Be Listed Below (If Applicable).  COVID-19 Vaccine Information can be found at:  [email or web address] For questions related to vaccine distribution or appointments, please email [email or web address] or call [phone].      Echocardiogram An echocardiogram is a procedure that uses painless sound waves (ultrasound) to produce an image of the heart. Images from an echocardiogram can provide important information about:  Signs of coronary artery disease (CAD).  Aneurysm detection. An aneurysm is a weak or damaged part of an artery wall that bulges out from the normal force of blood pumping through the body.  Heart size and shape. Changes in the size or shape of the heart can be associated with certain conditions, including heart failure, aneurysm, and CAD.  Heart muscle function.  Heart valve function.  Signs of a past heart attack.  Fluid buildup around the heart.  Thickening of the heart muscle.  A tumor or infectious growth around the heart valves. Tell a health care provider about:  Any allergies you have.  All medicines you are taking, including vitamins, herbs, eye drops, creams, and over-the-counter medicines.  Any blood disorders you have.  Any surgeries you have had.  Any medical conditions you have.  Whether you are pregnant or may be pregnant. What are the risks? Generally, this is a safe procedure. However, problems may occur, including:  Allergic reaction to dye (contrast) that may be used during the procedure. What happens before the procedure? No specific preparation is needed. You may eat and drink normally. What happens during the procedure?   An IV tube may be inserted into one of your veins.  You may receive contrast through this tube. A contrast is  an injection that improves the quality of the pictures from your heart.  A gel will be applied to your chest.  A wand-like tool (transducer) will be moved over your chest. The gel will help to transmit the sound waves from  the transducer.  The sound waves will harmlessly bounce off of your heart to allow the heart images to be captured in real-time motion. The images will be recorded on a computer. The procedure may vary among health care providers and hospitals. What happens after the procedure?  You may return to your normal, everyday life, including diet, activities, and medicines, unless your health care provider tells you not to do that. Summary  An echocardiogram is a procedure that uses painless sound waves (ultrasound) to produce an image of the heart.  Images from an echocardiogram can provide important information about the size and shape of your heart, heart muscle function, heart valve function, and fluid buildup around your heart.  You do not need to do anything to prepare before this procedure. You may eat and drink normally.  After the echocardiogram is completed, you may return to your normal, everyday life, unless your health care provider tells you not to do that. This information is not intended to replace advice given to you by your health care provider. Make sure you discuss any questions you have with your health care provider. Document Revised: 09/26/2018 Document Reviewed: 07/08/2016 Elsevier Patient Education  2020 [employer].

## 2020-02-09 ENCOUNTER — Other Ambulatory Visit: Payer: Self-pay

## 2020-02-09 ENCOUNTER — Ambulatory Visit: Payer: No Typology Code available for payment source | Admitting: Cardiovascular Disease

## 2020-02-09 ENCOUNTER — Ambulatory Visit (INDEPENDENT_AMBULATORY_CARE_PROVIDER_SITE_OTHER): Payer: No Typology Code available for payment source

## 2020-02-09 DIAGNOSIS — R0602 Shortness of breath: Secondary | ICD-10-CM | POA: Diagnosis not present

## 2020-02-09 DIAGNOSIS — R000 Tachycardia, unspecified: Secondary | ICD-10-CM | POA: Diagnosis not present

## 2020-02-09 LAB — ECHOCARDIOGRAM COMPLETE
Area-P 1/2: 3.91 cm2
S' Lateral: 2 cm

## 2020-02-10 ENCOUNTER — Encounter: Payer: Self-pay | Admitting: Family Medicine

## 2020-02-11 NOTE — Telephone Encounter (Signed)
Call to patient to seek more information regarding mychart message.  Pt seen by Dr. Mariah Milling 8 20 21  dx with orthostatic hypotension.  Echo performed and final read not ready at this time.  preliminary findings discussed with patient. EF WNL.  Per Dr. OV note, if EF WNL, may start midodrine for orthostasis.   Unknown if any further testing is needed.   Pt inquiring about extension on short term disability. Per Ethelene Hal, RN, PCP is working on this. At this time, pt is not being covered as it ran out on 02/06/20.  Pt made aware disability is currently being managed by PCP. If any further assistance is needed he should let 02/08/20 know.   Since OV pt has been drinking 2-3 20 oz bottles of water daily. Sx have continued having dizziness w standing. He reports changing positions slowly to minimize sx.   Pt has OV scheduled next week, 9/3 with app. No immediate needs at this time.   Routing to primary RN as 11/3.

## 2020-02-11 NOTE — Telephone Encounter (Signed)
Since he has seen Dr. Mariah Milling, the cardiologist, for this problem (sinus tachycardia), the extension should be filled out by Dr. Mariah Milling. He is now treating the problem and he will be the one making decisions about working

## 2020-02-12 NOTE — Telephone Encounter (Signed)
Patient calling to check on status  Patient also wanted to let us know to be on the look out for Short Term Disability papers coming from his work

## 2020-02-13 NOTE — Telephone Encounter (Signed)
Patient disability forms have been received from [facility] and placed in the nurse box

## 2020-02-15 NOTE — Telephone Encounter (Signed)
At this time not much more cardiac work-up needed Needs to discuss with Dr. Clent Ridges how to get his weight back up whether to medications or other intervention Needs to also talk with Dr. Clent Ridges how to better control his diabetes I do not see a clear cardiac reason for disability but we can fill out a form but it may not support his because He may want to have Dr. Clent Ridges involved in the paperwork as well given the dramatic weight loss, poorly controlled diabetes which are noncardiac issues and need to be worked up Dr. Clent Ridges or our office can prescribe low-dose midodrine as needed for low blood pressure secondary to traumatic weight loss Recommend hydration

## 2020-02-16 ENCOUNTER — Encounter: Payer: Self-pay | Admitting: Family Medicine

## 2020-02-16 NOTE — Telephone Encounter (Signed)
BP yesterday 100/66. Wife reports that she been able to get him to eat and drink today and he was able to independently ambulate to the bathroom so she believes he is improving.   Per last OV note, information from Dr. Mariah Milling about disability and midodrine communicated to wife. She agreed to call Dr. Abran Cantor for further advice.

## 2020-02-16 NOTE — Telephone Encounter (Signed)
Call to wife, in regards to [place] message sent.   I told wife Aurther Loft that I recommended urgent medical care in the setting of recent hypotension, fever, dec PO intake and extreme fatigue. She does not have positive covid test at this time but feels it may be.   I urged that if she did not feel she could get patient into her vehicle safety it would be most appropriate to call EMS.   Wife verbalized understanding and is in agreement with POC.   Routing to primary cardiologist to make aware.

## 2020-02-17 NOTE — Telephone Encounter (Signed)
Have him see me for another in person OV so we can go over this. Not only is this to fill out FMLA, but we need to adjust his cardiac medications, look at his diabetes control, etc

## 2020-02-18 ENCOUNTER — Encounter: Payer: Self-pay | Admitting: Family Medicine

## 2020-02-18 ENCOUNTER — Telehealth (INDEPENDENT_AMBULATORY_CARE_PROVIDER_SITE_OTHER): Payer: No Typology Code available for payment source | Admitting: Family Medicine

## 2020-02-18 DIAGNOSIS — E119 Type 2 diabetes mellitus without complications: Secondary | ICD-10-CM | POA: Diagnosis not present

## 2020-02-18 DIAGNOSIS — R6889 Other general symptoms and signs: Secondary | ICD-10-CM

## 2020-02-18 DIAGNOSIS — I10 Essential (primary) hypertension: Secondary | ICD-10-CM | POA: Diagnosis not present

## 2020-02-18 DIAGNOSIS — R000 Tachycardia, unspecified: Secondary | ICD-10-CM | POA: Diagnosis not present

## 2020-02-18 MED ORDER — NOVOLIN 70/30 FLEXPEN (70-30) 100 UNIT/ML ~~LOC~~ SUPN: 10.0000 [IU] | PEN_INJECTOR | Freq: Two times a day (BID) | SUBCUTANEOUS | 5 refills | Status: DC

## 2020-02-18 NOTE — Progress Notes (Signed)
Subjective:    Patient ID: [name], male    DOB: 13-Aug-1955, 64 y.o.   MRN: [id number]  HPI Virtual Visit via Video Note  I connected with the patient on 02/18/20 at  4:15 PM EDT by a video enabled telemedicine application and verified that I am speaking with the correct person using two identifiers.  Location patient: home Location provider:work or home office Persons participating in the virtual visit: patient, provider  I discussed the limitations of evaluation and management by telemedicine and the availability of in person appointments. The patient expressed understanding and agreed to proceed.   HPI: Here with his wife [name] to follow up on weakness, poor appetite, poorly controlled diabetes,  Tachycardia, and a likely Covid-19 infection. His last A1c in May was 13.4, and his random glucose in the ER on 01-10-20 was 380. The last time he checked his glucose at home was about 2 weeks ago, and it was "200 something". His BP has been low and his pulse was high, but this has improved after starting him on Metoprolol succinate. Thr HR has dropped from 130 to 100. He saw Dr. Mariah Milling on 02-06-20 for a cardiology evaluation, and his ECHO that day was unremarkable. Dr. Mariah Milling determined that his cardiac status is fine, but that his tachycardia is the result of dehydration from poorly controlled diabetes. He asked [name] to follow up with [place]. Today [name] feels extremely weak. He is drinking water and Pedialyte but he is eating almost no food at all. His appetite is poor. He has some body aches and some diarrhea. No vomiting. He has had intermittent fevers to 100 degrees for the past few days. He has a mild dry cough but no chest pain or SOB.  His wife is getting similar symptoms,and she tested positive for the Covid virus last week. As we speak his glucose is 198, and he ate 1/2 of a sandwich about an hour ago. His BP now is 103/64 with a pulse of 105 and an oxygen sat of 98%. He has continued to take  Metformin and Glipizide.    ROS: See pertinent positives and negatives per HPI.  Past Medical History:  Diagnosis Date  . Diabetes mellitus   . Hematospermia   . Hypertension   . Nephrolithiasis 02/07/07  . Viral meningitis     Past Surgical History:  Procedure Laterality Date  . CHOLECYSTECTOMY  12-07-14   laparoscopic, per Dr. Rayburn Ma     Family History  Problem Relation Age of Onset  . Rheum arthritis Mother   . Diabetes Father   . Heart disease Father   . Cholecystitis Sister   . Cholecystitis Brother   . Diabetes Brother   . Diabetes Brother   . Parkinson's disease Brother   . Heart disease Sister   . Diabetes Sister   . Cholecystitis Sister   . Diabetes Sister   . Arthritis Other   . Hypertension Other   . Stroke Other   . Coronary artery disease Other   . Birth defects Neg Hx      Current Outpatient Medications:  .  ACCU-CHEK GUIDE test strip, USE AS DIRECTED EVERY DAY, Disp: , Rfl:  .  Accu-Chek Softclix Lancets lancets, daily., Disp: , Rfl:  .  ALPRAZolam (XANAX) 0.25 MG tablet, Take 1 tablet (0.25 mg total) by mouth 3 (three) times daily as needed for anxiety., Disp: 90 tablet, Rfl: 2 .  B Complex Vitamins (B COMPLEX 1 PO), Take by mouth., Disp: ,  Rfl:  .  Blood Glucose Monitoring Suppl (ACCU-CHEK GUIDE ME) w/Device KIT, USE AS DIRECTED EVERY DAY, Disp: , Rfl:  .  glipiZIDE (GLUCOTROL) 10 MG tablet, TAKE 1 TABLET (10 MG TOTAL) BY MOUTH 2 (TWO) TIMES DAILY BEFORE A MEAL., Disp: 180 tablet, Rfl: 1 .  metFORMIN (GLUCOPHAGE) 1000 MG tablet, Take 0.5 tablet (500 mg) by mouth twice daily, Disp: , Rfl:  .  metoprolol succinate (TOPROL-XL) 25 MG 24 hr tablet, Take 1 tablet (25 mg total) by mouth daily., Disp: 30 tablet, Rfl: 2 .  temazepam (RESTORIL) 30 MG capsule, Take 1 capsule (30 mg total) by mouth at bedtime as needed for sleep., Disp: 30 capsule, Rfl: 5  EXAM:  VITALS per patient if applicable:  GENERAL: alert, oriented, he appears to be quite weak.    HEENT: atraumatic, conjunttiva clear, no obvious abnormalities on inspection of external nose and ears  NECK: normal movements of the head and neck  LUNGS: on inspection no signs of respiratory distress, breathing rate appears normal, no obvious gross SOB, gasping or wheezing  CV: no obvious cyanosis  MS: moves all visible extremities without noticeable abnormality  PSYCH/NEURO: pleasant and cooperative, no obvious depression or anxiety, speech and thought processing grossly intact  ASSESSMENT AND PLAN: First he is dealing with a likely Covid virus infection and he remains quarantined at home. I asked him to drink eight 8 oz glasses of fluid every day. As for the diabetes, he is trying to control this by consuming very few calories, and I explained to him that this will not work. I want him to take in 1800-2000 calories a day. We will stop the Glipizide and the Metformin, and he will start taking Novolin 70/30 at 10 units BID. We will continue the Metoprolol succinate for now. I asked to have a virtual follow up visit with him next week, and he is to check his BP, pulse, and glucose twice daily until then. We will fill out short term disability papers to cover him from 02-06-20 until 03-22-20. These will be faxed to the [facility] at [phone] and to his case manager, Clarene Critchley, at [phone].  Alysia Penna, MD  Discussed the following assessment and plan:  No diagnosis found.     I discussed the assessment and treatment plan with the patient. The patient was provided an opportunity to ask questions and all were answered. The patient agreed with the plan and demonstrated an understanding of the instructions.   The patient was advised to call back or seek an in-person evaluation if the symptoms worsen or if the condition fails to improve as anticipated.     Review of Systems     Objective:   Physical Exam        Assessment & Plan:

## 2020-02-20 ENCOUNTER — Ambulatory Visit: Payer: No Typology Code available for payment source | Admitting: Physician Assistant

## 2020-02-24 ENCOUNTER — Telehealth: Payer: No Typology Code available for payment source | Admitting: Family Medicine

## 2020-02-24 ENCOUNTER — Telehealth (INDEPENDENT_AMBULATORY_CARE_PROVIDER_SITE_OTHER): Payer: No Typology Code available for payment source | Admitting: Family Medicine

## 2020-02-24 ENCOUNTER — Encounter: Payer: Self-pay | Admitting: Family Medicine

## 2020-02-24 DIAGNOSIS — R000 Tachycardia, unspecified: Secondary | ICD-10-CM | POA: Diagnosis not present

## 2020-02-24 DIAGNOSIS — I10 Essential (primary) hypertension: Secondary | ICD-10-CM

## 2020-02-24 DIAGNOSIS — F411 Generalized anxiety disorder: Secondary | ICD-10-CM | POA: Diagnosis not present

## 2020-02-24 DIAGNOSIS — E119 Type 2 diabetes mellitus without complications: Secondary | ICD-10-CM

## 2020-02-24 MED ORDER — ALPRAZOLAM 0.5 MG PO TABS: 0.5000 mg | ORAL_TABLET | Freq: Three times a day (TID) | ORAL | 1 refills | Status: DC | PRN

## 2020-02-24 MED ORDER — NOVOLIN 70/30 FLEXPEN (70-30) 100 UNIT/ML ~~LOC~~ SUPN: 15.0000 [IU] | PEN_INJECTOR | Freq: Two times a day (BID) | SUBCUTANEOUS | 5 refills | Status: DC

## 2020-02-24 NOTE — Progress Notes (Signed)
Subjective:    Patient ID: [name], male    DOB: [birth year], 64 y.o.   MRN: [id number]  HPI Virtual Visit via Video Note  I connected with the patient on 02/24/20 at  1:00 PM EDT by a video enabled telemedicine application and verified that I am speaking with the correct person using two identifiers.  Location patient: home Location provider:work or home office Persons participating in the virtual visit: patient, provider  I discussed the limitations of evaluation and management by telemedicine and the availability of in person appointments. The patient expressed understanding and agreed to proceed.   HPI: Here to follow up from our visit last week. At that time we stopped his oral diabetes medications and started him on Novolin 70/30 at 10 units BID. Per my instructions he has also been drinking a lot more water daily and eating regular meals. His glucoses, both in the mornings and the evenings, over the past few days have averaged 186 to 213. His BP this am was 127/69, his heart rate was 111, and his oxygen sat was 96%. He feels much better than he did as well. He has more energy, and the fevers have stopped. He likely had a Covid infection because his wife had tested positive for the virus. Also his anxiety has been more of a problem, and he asks to increase the dose of his Xanax.    ROS: See pertinent positives and negatives per HPI.  Past Medical History:  Diagnosis Date  . Diabetes mellitus   . Hematospermia   . Hypertension   . Nephrolithiasis 02/07/07  . Viral meningitis     Past Surgical History:  Procedure Laterality Date  . CHOLECYSTECTOMY  12-07-14   laparoscopic, per Dr. Rush Farmer     Family History  Problem Relation Age of Onset  . Rheum arthritis Mother   . Diabetes Father   . Heart disease Father   . Cholecystitis Sister   . Cholecystitis Brother   . Diabetes Brother   . Diabetes Brother   . Parkinson's disease Brother   . Heart disease Sister   .  Diabetes Sister   . Cholecystitis Sister   . Diabetes Sister   . Arthritis Other   . Hypertension Other   . Stroke Other   . Coronary artery disease Other   . Birth defects Neg Hx      Current Outpatient Medications:  .  ACCU-CHEK GUIDE test strip, USE AS DIRECTED EVERY DAY, Disp: , Rfl:  .  Accu-Chek Softclix Lancets lancets, daily., Disp: , Rfl:  .  ALPRAZolam (XANAX) 0.25 MG tablet, Take 1 tablet (0.25 mg total) by mouth 3 (three) times daily as needed for anxiety., Disp: 90 tablet, Rfl: 2 .  Ascorbic Acid (VITAMIN C PO), Take by mouth., Disp: , Rfl:  .  B Complex Vitamins (B COMPLEX 1 PO), Take by mouth., Disp: , Rfl:  .  Blood Glucose Monitoring Suppl (ACCU-CHEK GUIDE ME) w/Device KIT, USE AS DIRECTED EVERY DAY, Disp: , Rfl:  .  insulin isophane & regular human (NOVOLIN 70/30 FLEXPEN) (70-30) 100 UNIT/ML KwikPen, Inject 10 Units into the skin in the morning and at bedtime., Disp: 15 mL, Rfl: 5 .  metoprolol succinate (TOPROL-XL) 25 MG 24 hr tablet, Take 1 tablet (25 mg total) by mouth daily., Disp: 30 tablet, Rfl: 2 .  temazepam (RESTORIL) 30 MG capsule, Take 1 capsule (30 mg total) by mouth at bedtime as needed for sleep., Disp: 30 capsule, Rfl: 5  EXAM:  VITALS per patient if applicable:  GENERAL: alert, oriented, appears well and in no acute distress  HEENT: atraumatic, conjunttiva clear, no obvious abnormalities on inspection of external nose and ears  NECK: normal movements of the head and neck  LUNGS: on inspection no signs of respiratory distress, breathing rate appears normal, no obvious gross SOB, gasping or wheezing  CV: no obvious cyanosis  MS: moves all visible extremities without noticeable abnormality  PSYCH/NEURO: pleasant and cooperative, no obvious depression or anxiety, speech and thought processing grossly intact  ASSESSMENT AND PLAN: He seesm to be recovering from a Covid-19 infection. For the diabetes we will increase the Novolin 70/30 to 15 units  BID. The tachycardia is stable. For the anxiety we will nicrease Xanax to 0.5 mg TID as needed.  Recheck next week. Alysia Penna, MD  Discussed the following assessment and plan:  No diagnosis found.     I discussed the assessment and treatment plan with the patient. The patient was provided an opportunity to ask questions and all were answered. The patient agreed with the plan and demonstrated an understanding of the instructions.   The patient was advised to call back or seek an in-person evaluation if the symptoms worsen or if the condition fails to improve as anticipated.     Review of Systems     Objective:   Physical Exam        Assessment & Plan:

## 2020-02-27 ENCOUNTER — Encounter: Payer: Self-pay | Admitting: Neurology

## 2020-02-27 ENCOUNTER — Other Ambulatory Visit: Payer: Self-pay

## 2020-02-27 ENCOUNTER — Ambulatory Visit: Payer: No Typology Code available for payment source | Admitting: Neurology

## 2020-02-27 ENCOUNTER — Encounter: Payer: Self-pay | Admitting: Family Medicine

## 2020-02-27 VITALS — BP 118/70 | HR 109 | Ht 68.0 in | Wt 165.0 lb

## 2020-02-27 DIAGNOSIS — E1142 Type 2 diabetes mellitus with diabetic polyneuropathy: Secondary | ICD-10-CM

## 2020-02-27 DIAGNOSIS — E1140 Type 2 diabetes mellitus with diabetic neuropathy, unspecified: Secondary | ICD-10-CM

## 2020-02-27 DIAGNOSIS — Z794 Long term (current) use of insulin: Secondary | ICD-10-CM | POA: Diagnosis not present

## 2020-02-27 DIAGNOSIS — R260 Ataxic gait: Secondary | ICD-10-CM | POA: Diagnosis not present

## 2020-02-27 NOTE — Progress Notes (Signed)
[facility] Clinic Note - Initial Visit   Date: 02/27/20  [name] MRN: [id number] DOB: 01-06-1956   Dear Dr. Sarajane Jews:  Thank you for your kind referral of [name] for consultation of neuropathy. Although his history is well known to you, please allow [place] to reiterate it for the purpose of our medical record. The patient was accompanied to the clinic by wife who also provides collateral information.     History of Present Illness: [name] is a 64 y.o. right-handed male with hypertension, poorly controlled diabetes mellitus, and insomnia presenting for evaluation of neuropathy.  He has been diabetic for the past 20 years.  He ran out of medications for about a year which lead to worsening diabetes.  Last HbA1c is 13.4.  He reports having cold in the feet for the past year and feels as if his hands feel are asleep.  He also has some weakness in the hands and feet. Imbalance started about a month ago.  He has been using a walker for the past several weeks.  He gets episodic sharp pain in the legs which was exacerbated by gabapentin, so stopped it.  He feels that his balance significantly got worse after being hospitalized with COVID. He is not vaccinated.   Out-side paper records, electronic medical record, and images have been reviewed where available and summarized as:  Lab Results  Component Value Date   HGBA1C 13.4 (A) 10/31/2019   Lab Results  Component Value Date   [id number] 562 10/31/2019   Lab Results  Component Value Date   TSH 2.38 10/31/2019   No results found for: ESRSEDRATE, POCTSEDRATE  Past Medical History:  Diagnosis Date  . Diabetes mellitus   . Hematospermia   . Hypertension   . Nephrolithiasis 02/07/07  . Viral meningitis     Past Surgical History:  Procedure Laterality Date  . CHOLECYSTECTOMY  12-07-14   laparoscopic, per Dr. Rush Farmer      Medications:  Outpatient Encounter Medications as of 02/27/2020  Medication  Sig  . ACCU-CHEK GUIDE test strip USE AS DIRECTED EVERY DAY  . Accu-Chek Softclix Lancets lancets daily.  Marland Kitchen ALPRAZolam (XANAX) 0.5 MG tablet Take 1 tablet (0.5 mg total) by mouth 3 (three) times daily as needed for anxiety or sleep.  . Ascorbic Acid (VITAMIN C PO) Take by mouth.  . B Complex Vitamins (B COMPLEX 1 PO) Take by mouth.  . Blood Glucose Monitoring Suppl (ACCU-CHEK GUIDE ME) w/Device KIT USE AS DIRECTED EVERY DAY  . insulin isophane & regular human (NOVOLIN 70/30 FLEXPEN) (70-30) 100 UNIT/ML KwikPen Inject 15 Units into the skin in the morning and at bedtime.  . metoprolol succinate (TOPROL-XL) 25 MG 24 hr tablet Take 1 tablet (25 mg total) by mouth daily.  . temazepam (RESTORIL) 30 MG capsule Take 1 capsule (30 mg total) by mouth at bedtime as needed for sleep.   No facility-administered encounter medications on file as of 02/27/2020.    Allergies: No Known Allergies  Family History: Family History  Problem Relation Age of Onset  . Rheum arthritis Mother   . Diabetes Father   . Heart disease Father   . Cholecystitis Sister   . Cholecystitis Brother   . Diabetes Brother   . Diabetes Brother   . Parkinson's disease Brother   . Heart disease Sister   . Diabetes Sister   . Cholecystitis Sister   . Diabetes Sister   . Arthritis Other   .  Hypertension Other   . Stroke Other   . Coronary artery disease Other   . Birth defects Neg Hx     Social History: Social History   Tobacco Use  . Smoking status: Former Research scientist (life sciences)  . Smokeless tobacco: Never Used  . Tobacco comment: 22 yrs ago   Substance Use Topics  . Alcohol use: No    Alcohol/week: 0.0 standard drinks  . Drug use: No   Social History   Social History Narrative   Right Handed   Drinks little caffeine - only diet sodas if so   One Story Home    5 Grandchildren    Vital Signs:  BP 118/70   Pulse (!) 109   Ht _0  (1.727 m)   Wt 165 lb (74.8 kg)   SpO2 97%   BMI 25.09 kg/m    Neurological  Exam: MENTAL STATUS including orientation to time, place, person, recent and remote memory, attention span and concentration, language, and fund of knowledge is normal.  Speech is not dysarthric.  CRANIAL NERVES: II:  No visual field defects.   III-IV-VI: Pupils equal round and reactive.  Normal conjugate, extra-ocular eye movements in all directions of gaze.  No nystagmus.  No ptosis.   V:  Normal facial sensation.    VII:  Normal facial symmetry and movements.   VIII:  Normal hearing and vestibular function.   IX-X:  Normal palatal movement.   XI:  Normal shoulder shrug and head rotation.   XII:  Normal tongue strength and range of motion, no deviation or fasciculation.  MOTOR:  Muscle atrophy in the intrinsic hand muscles, bilateral TA and medial gastrocnemius.  No fasciculations or abnormal movements.  No pronator drift.   Upper Extremity:  Right  Left  Deltoid  5/5   5/5   Biceps  5/5   5/5   Triceps  5/5   5/5   Infraspinatus 5/5  5/5  Medial pectoralis 5/5  5/5  Wrist extensors  5/5   5/5   Wrist flexors  5/5   5/5   Finger extensors  4/5   4/5   Finger flexors  5-/5   5-/5   Dorsal interossei  4/5   4/5   Abductor pollicis  5/5   5/5   Tone (Ashworth scale)  0  0   Lower Extremity:  Right  Left  Hip flexors  4/5   4/5   Hip extensors  5/5   5/5   Adductor 5/5  5/5  Abductor 5/5  5/5  Knee flexors  5/5   5/5   Knee extensors  5/5   5/5   Dorsiflexors  4/5   4/5   Plantarflexors  5-/5   5/5   Toe extensors  4/5   4/5   Toe flexors  4/5   4/5   Tone (Ashworth scale)  0  0   MSRs:  Right        Left                  brachioradialis 2+  2+  biceps 2+  2+  triceps 2+  2+  patellar tr  tr  ankle jerk 0  0  Hoffman no  no  plantar response down  down   SENSORY:  Gradient pattern of sensory loss to pin prick and temperature distal to mid calf bilaterally, vibration is reduced to 50% at the knees and absent in the legs.  Sensation to temperature reduced in  the hands.   Markedly positive Rhomberg sign  COORDINATION/GAIT: Mild dysmetria with finger-to- nose-finger.  Intact rapid alternating movements bilaterally.  Severely wide-based and ataxic gait, needs one person assist. Gait improved with walker.   IMPRESSION: 1. Severe diabetic polyradiculoneuropathy given the relative subacute onset of symptoms and involvement of both proximal and distal muscles in a non-length dependent pattern. 2. Severe gait ataxia due to #1 3. Poorly controlled diabetes mellitus  PLAN/RECOMMENDATIONS:  Check copper, SPEP with IFE, folate, vitamin B1 NCS/EMG of the left side Start PT for gait training Always use walker, use shower stool Patient educated on daily foot inspection, fall prevention, and safety precautions around the home. Stressed the importance of diabetes control to minimize progression of neuropathy  Further recommendations pending results.  Total time spent reviewing records, interview, history/exam, documentation, counseling and coordination of care on day of encounter:  60 min    Thank you for allowing me to participate in patient's care.  If I can answer any additional questions, I would be pleased to do so.    Sincerely,    Javiel Canepa K. Posey Pronto, DO

## 2020-02-27 NOTE — Telephone Encounter (Signed)
I would wait 4 more weeks to get the A1c since he has only been on insulin a short time

## 2020-02-27 NOTE — Patient Instructions (Addendum)
Check labs  Nerve testing of the left arm and leg  Start physical therapy for balance training  Always use your walker  Check feet daily  It is very important to keep your blood sugars under control  ELECTROMYOGRAM AND NERVE CONDUCTION STUDIES (EMG/NCS) INSTRUCTIONS  How to Prepare The neurologist conducting the EMG will need to know if you have certain medical conditions. Tell the neurologist and other EMG lab personnel if you:  Have a pacemaker or any other electrical medical device  Take blood-thinning medications  Have hemophilia, a blood-clotting disorder that causes prolonged bleeding Bathing Take a shower or bath shortly before your exam in order to remove oils from your skin. Dont apply lotions or creams before the exam.  What to Expect Youll likely be asked to change into a hospital gown for the procedure and lie down on an examination table. The following explanations can help you understand what will happen during the exam.   Electrodes. The neurologist or a technician places surface electrodes at various locations on your skin depending on where youre experiencing symptoms. Or the neurologist may insert needle electrodes at different sites depending on your symptoms.   Sensations. The electrodes will at times transmit a tiny electrical current that you may feel as a twinge or spasm. The needle electrode may cause discomfort or pain that usually ends shortly after the needle is removed. If you are concerned about discomfort or pain, you may want to talk to the neurologist about taking a short break during the exam.   Instructions. During the needle EMG, the neurologist will assess whether there is any spontaneous electrical activity when the muscle is at rest - activity that isnt present in healthy muscle tissue - and the degree of activity when you slightly contract the muscle.  He or she will give you instructions on resting and contracting a muscle at appropriate  times. Depending on what muscles and nerves the neurologist is examining, he or she may ask you to change positions during the exam.  After your EMG You may experience some temporary, minor bruising where the needle electrode was inserted into your muscle. This bruising should fade within several days. If it persists, contact your primary care doctor.

## 2020-02-27 NOTE — Telephone Encounter (Signed)
Please advise. I do not see a recent A1C

## 2020-03-01 ENCOUNTER — Encounter: Payer: Self-pay | Admitting: Family Medicine

## 2020-03-01 ENCOUNTER — Telehealth (INDEPENDENT_AMBULATORY_CARE_PROVIDER_SITE_OTHER): Payer: No Typology Code available for payment source | Admitting: Family Medicine

## 2020-03-01 VITALS — BP 123/72 | HR 100 | Temp 98.6°F

## 2020-03-01 DIAGNOSIS — E119 Type 2 diabetes mellitus without complications: Secondary | ICD-10-CM

## 2020-03-01 DIAGNOSIS — I10 Essential (primary) hypertension: Secondary | ICD-10-CM | POA: Diagnosis not present

## 2020-03-01 DIAGNOSIS — R000 Tachycardia, unspecified: Secondary | ICD-10-CM | POA: Diagnosis not present

## 2020-03-01 DIAGNOSIS — F411 Generalized anxiety disorder: Secondary | ICD-10-CM

## 2020-03-01 NOTE — Progress Notes (Signed)
Subjective:    Patient ID: [name], male    DOB: [birth year], 64 y.o.   MRN: [id number]  HPI Virtual Visit via Video Note  I connected with the patient on 03/01/20 at  4:00 PM EDT by a video enabled telemedicine application and verified that I am speaking with the correct person using two identifiers.  Location patient: home Location provider:work or home office Persons participating in the virtual visit: patient, provider  I discussed the limitations of evaluation and management by telemedicine and the availability of in person appointments. The patient expressed understanding and agreed to proceed.   HPI: Here to follow up on diabetes, HTN , and anxiety. At our last visit we increase the Xanax to 0.5 mg TID, and this has been very helpful. We had increased the insulin to 15 units BID, and his glucoses have improved slightly. He has been getting readings of 180 to 230 mist of the time. He feels much better and his strength is improving. He is eating more food, particularly proteins, and eating less carbs. His BP is stable around 140/80 with pulse rates around 90-100. He met with Dr. Posey Pronto about the diabetic neuropathy, and she is planning on getting an EMG.    ROS: See pertinent positives and negatives per HPI.  Past Medical History:  Diagnosis Date   Diabetes mellitus    Hematospermia    Hypertension    Nephrolithiasis 02/07/07   Viral meningitis     Past Surgical History:  Procedure Laterality Date   CHOLECYSTECTOMY  12-07-14   laparoscopic, per Dr. Rush Farmer     Family History  Problem Relation Age of Onset   Rheum arthritis Mother    Diabetes Father    Heart disease Father    Cholecystitis Sister    Cholecystitis Brother    Diabetes Brother    Diabetes Brother    Parkinson's disease Brother    Heart disease Sister    Diabetes Sister    Cholecystitis Sister    Diabetes Sister    Arthritis Other    Hypertension Other    Stroke Other     Coronary artery disease Other    Birth defects Neg Hx      Current Outpatient Medications:    ACCU-CHEK GUIDE test strip, USE AS DIRECTED EVERY DAY, Disp: , Rfl:    Accu-Chek Softclix Lancets lancets, daily., Disp: , Rfl:    ALPRAZolam (XANAX) 0.5 MG tablet, Take 1 tablet (0.5 mg total) by mouth 3 (three) times daily as needed for anxiety or sleep., Disp: 270 tablet, Rfl: 1   Ascorbic Acid (VITAMIN C PO), Take by mouth., Disp: , Rfl:    B Complex Vitamins (B COMPLEX 1 PO), Take by mouth., Disp: , Rfl:    Blood Glucose Monitoring Suppl (ACCU-CHEK GUIDE ME) w/Device KIT, USE AS DIRECTED EVERY DAY, Disp: , Rfl:    insulin isophane & regular human (NOVOLIN 70/30 FLEXPEN) (70-30) 100 UNIT/ML KwikPen, Inject 15 Units into the skin in the morning and at bedtime., Disp: 15 mL, Rfl: 5   metoprolol succinate (TOPROL-XL) 25 MG 24 hr tablet, Take 1 tablet (25 mg total) by mouth daily., Disp: 30 tablet, Rfl: 2   temazepam (RESTORIL) 30 MG capsule, Take 1 capsule (30 mg total) by mouth at bedtime as needed for sleep., Disp: 30 capsule, Rfl: 5  EXAM:  VITALS per patient if applicable:  GENERAL: alert, oriented, appears well and in no acute distress  HEENT: atraumatic, conjunttiva clear, no obvious abnormalities  on inspection of external nose and ears  NECK: normal movements of the head and neck  LUNGS: on inspection no signs of respiratory distress, breathing rate appears normal, no obvious gross SOB, gasping or wheezing  CV: no obvious cyanosis  MS: moves all visible extremities without noticeable abnormality  PSYCH/NEURO: pleasant and cooperative, no obvious depression or anxiety, speech and thought processing grossly intact  ASSESSMENT AND PLAN: For the diabetes, we will increase the Novolin 70/30 to 20 units BID. His HTN and anxiety are stable. Recheck in the office on 03-15-20.  Alysia Penna, MD  Discussed the following assessment and plan:  No diagnosis found.     I  discussed the assessment and treatment plan with the patient. The patient was provided an opportunity to ask questions and all were answered. The patient agreed with the plan and demonstrated an understanding of the instructions.   The patient was advised to call back or seek an in-person evaluation if the symptoms worsen or if the condition fails to improve as anticipated.     Review of Systems     Objective:   Physical Exam        Assessment & Plan:

## 2020-03-02 NOTE — Telephone Encounter (Signed)
Noted, he had an OV yesterday

## 2020-03-04 NOTE — Telephone Encounter (Signed)
Reached out via phone.  Patient requests forms be mailed to home address.  Sent via mail to confirmed address.

## 2020-03-05 ENCOUNTER — Ambulatory Visit: Payer: No Typology Code available for payment source | Admitting: Family Medicine

## 2020-03-05 ENCOUNTER — Ambulatory Visit: Payer: No Typology Code available for payment source | Admitting: Cardiology

## 2020-03-10 ENCOUNTER — Encounter: Payer: Self-pay | Admitting: Family Medicine

## 2020-03-11 ENCOUNTER — Other Ambulatory Visit: Payer: Self-pay

## 2020-03-11 DIAGNOSIS — R260 Ataxic gait: Secondary | ICD-10-CM

## 2020-03-11 DIAGNOSIS — E1140 Type 2 diabetes mellitus with diabetic neuropathy, unspecified: Secondary | ICD-10-CM

## 2020-03-15 ENCOUNTER — Other Ambulatory Visit: Payer: Self-pay

## 2020-03-15 ENCOUNTER — Encounter: Payer: Self-pay | Admitting: Family Medicine

## 2020-03-15 ENCOUNTER — Ambulatory Visit: Payer: No Typology Code available for payment source | Admitting: Family Medicine

## 2020-03-15 VITALS — BP 100/50 | HR 99 | Temp 98.2°F | Ht 68.0 in | Wt 165.0 lb

## 2020-03-15 DIAGNOSIS — E1140 Type 2 diabetes mellitus with diabetic neuropathy, unspecified: Secondary | ICD-10-CM | POA: Diagnosis not present

## 2020-03-15 DIAGNOSIS — E119 Type 2 diabetes mellitus without complications: Secondary | ICD-10-CM

## 2020-03-15 DIAGNOSIS — R000 Tachycardia, unspecified: Secondary | ICD-10-CM

## 2020-03-15 DIAGNOSIS — I10 Essential (primary) hypertension: Secondary | ICD-10-CM | POA: Diagnosis not present

## 2020-03-15 NOTE — Progress Notes (Signed)
   Subjective:    Patient ID: [name], male    DOB: 01-11-1956, 64 y.o.   MRN: [id number]  HPI Here to follow up on poorly controlled diabetes. We have been titrating up on his dose of insulin and lately he has been taking 20 units of Novolin 70/30 BID. This seems to be the perfect dose. His am fasting glucoses have been in the range of 95 to 105, and he only had one drop to 58 when he ate only a small salad for dinner. He feels better all in all and he is slowly regaining some energy. He has been seeing Dr. Allena Katz for diabetic neuropathy, and this has prohibited him from working. He has a lot of numbness in both hands, and this makes it difficult to type on a computer. He had an EMG on 03-11-20, and the results are still pending. He will see his eye doctor soon as well because his vision has been quite blurry.    Review of Systems  Constitutional: Positive for fatigue.  Eyes: Positive for visual disturbance.  Respiratory: Negative.   Cardiovascular: Negative.   Neurological: Positive for light-headedness and numbness.       Objective:   Physical Exam Constitutional:      General: He is not in acute distress.    Appearance: Normal appearance.     Comments: Walks with a walker   Cardiovascular:     Rate and Rhythm: Normal rate and regular rhythm.     Pulses: Normal pulses.     Heart sounds: Normal heart sounds.  Pulmonary:     Effort: Pulmonary effort is normal.     Breath sounds: Normal breath sounds.  Neurological:     Mental Status: He is alert.           Assessment & Plan:  His diabetes is now well controlled. He will continue on the current dose of insulin. He still cannot work due to hand numbness and blurred vision, so we will extend his disability period. He will plan to return to work on 04-26-20. We will check another A1c today.  [name], MD

## 2020-03-16 ENCOUNTER — Other Ambulatory Visit: Payer: No Typology Code available for payment source

## 2020-03-16 DIAGNOSIS — R260 Ataxic gait: Secondary | ICD-10-CM

## 2020-03-16 DIAGNOSIS — E1140 Type 2 diabetes mellitus with diabetic neuropathy, unspecified: Secondary | ICD-10-CM

## 2020-03-16 DIAGNOSIS — E1142 Type 2 diabetes mellitus with diabetic polyneuropathy: Secondary | ICD-10-CM

## 2020-03-16 LAB — HEMOGLOBIN A1C
Hgb A1c MFr Bld: 6 % of total Hgb — ABNORMAL HIGH (ref <5.7)
Mean Plasma Glucose: 126 (calc)
eAG (mmol/L): 7 (calc)

## 2020-03-16 LAB — SPECIMEN COMPROMISED

## 2020-03-16 LAB — CBC WITH DIFFERENTIAL/PLATELET
Absolute Monocytes: 518 cells/uL (ref 200–950)
Basophils Absolute: 80 cells/uL (ref 0–200)
Basophils Relative: 1.1 %
Eosinophils Absolute: 131 cells/uL (ref 15–500)
Eosinophils Relative: 1.8 %
HCT: 35.9 % — ABNORMAL LOW (ref 38.5–50.0)
Hemoglobin: 12.3 g/dL — ABNORMAL LOW (ref 13.2–17.1)
Lymphs Abs: 2051 cells/uL (ref 850–3900)
MCH: 32.1 pg (ref 27.0–33.0)
MCHC: 34.3 g/dL (ref 32.0–36.0)
MCV: 93.7 fL (ref 80.0–100.0)
MPV: 11.1 fL (ref 7.5–12.5)
Monocytes Relative: 7.1 %
Neutro Abs: 4519 cells/uL (ref 1500–7800)
Neutrophils Relative %: 61.9 %
Platelets: 247 10*3/uL (ref 140–400)
RBC: 3.83 10*6/uL — ABNORMAL LOW (ref 4.20–5.80)
RDW: 14 % (ref 11.0–15.0)
Total Lymphocyte: 28.1 %
WBC: 7.3 10*3/uL (ref 3.8–10.8)

## 2020-03-16 LAB — BASIC METABOLIC PANEL
BUN/Creatinine Ratio: 12 (calc) (ref 6–22)
BUN: 16 mg/dL (ref 7–25)
CO2: 25 mmol/L (ref 20–32)
Calcium: 9.1 mg/dL (ref 8.6–10.3)
Chloride: 104 mmol/L (ref 98–110)
Creat: 1.31 mg/dL — ABNORMAL HIGH (ref 0.70–1.25)
Glucose, Bld: 126 mg/dL — ABNORMAL HIGH (ref 65–99)
Potassium: 4 mmol/L (ref 3.5–5.3)
Sodium: 141 mmol/L (ref 135–146)

## 2020-03-16 LAB — FOLATE: Folate: 5.2 ng/mL — ABNORMAL LOW (ref >5.9)

## 2020-03-17 ENCOUNTER — Encounter: Payer: Self-pay | Admitting: Family Medicine

## 2020-03-18 LAB — PROTEIN ELECTROPHORESIS, SERUM
Albumin ELP: 3.5 g/dL — ABNORMAL LOW (ref 3.8–4.8)
Alpha 1: 0.3 g/dL (ref 0.2–0.3)
Alpha 2: 0.8 g/dL (ref 0.5–0.9)
Beta 2: 0.3 g/dL (ref 0.2–0.5)
Beta Globulin: 0.4 g/dL (ref 0.4–0.6)
Gamma Globulin: 1.3 g/dL (ref 0.8–1.7)
Total Protein: 6.6 g/dL (ref 6.1–8.1)

## 2020-03-18 LAB — IMMUNOFIXATION ELECTROPHORESIS
IgG (Immunoglobin G), Serum: 1257 mg/dL (ref 600–1540)
IgM, Serum: 142 mg/dL (ref 50–300)
Immunofix Electr Int: NOT DETECTED
Immunoglobulin A: 352 mg/dL — ABNORMAL HIGH (ref 70–320)

## 2020-03-18 LAB — COPPER, SERUM: Copper: 93 ug/dL (ref 70–175)

## 2020-03-18 NOTE — Telephone Encounter (Signed)
Attempted to call patient to discuss his message, no answer, will send message to patient.

## 2020-03-19 ENCOUNTER — Telehealth: Payer: Self-pay

## 2020-03-19 NOTE — Telephone Encounter (Signed)
-----   Message from Donika K Patel, DO sent at 03/17/2020  9:02 AM EDT ----- Please check into his labs to see if others are in process.   Looks like only folate was drawn, but there are orders from 9/23 for several others.  Thanks.  

## 2020-03-19 NOTE — Telephone Encounter (Signed)
-----   Message from Glendale Chard, DO sent at 03/17/2020  9:02 AM EDT ----- Please check into his labs to see if others are in process.   Looks like only folate was drawn, but there are orders from 9/23 for several others.  Thanks.

## 2020-03-19 NOTE — Telephone Encounter (Signed)
The form is ready to be faxed  

## 2020-03-19 NOTE — Telephone Encounter (Signed)
Spoke with Dr. Allena Katz. We will recheck patients labs next week to see if any have been updated.

## 2020-03-21 LAB — VITAMIN B1: Vitamin B1 (Thiamine): 6 nmol/L — ABNORMAL LOW (ref 8–30)

## 2020-03-23 NOTE — Telephone Encounter (Signed)
Called patient and informed him of results. Patient verbalized understanding of results and supplements recommended. Patient requested a mychart message be sent with information of which vitamins to purchase OTC.

## 2020-03-23 NOTE — Telephone Encounter (Signed)
-----   Message from Glendale Chard, DO sent at 03/22/2020 12:15 PM EDT ----- Please inform patient that his labs show folate and thiamine deficiency.  He needs to start OTC folic acid 1mg  daily and vitamin B1 (thiamine) 100mg  daily.  Remaining labs are within normal limits. Thanks.

## 2020-03-25 NOTE — Telephone Encounter (Signed)
Patient called and says [employer] have not received the STD paperwork that was faxed on 03/19/20 and asks for it to be sent again. The forms he picked up were attached and sent in MyChart, in case the copies were already sent to be scanned in the chart. I found the copies still at the front desk. They are faxed to [phone] and [phone] attention to [facility], confirmation received for both numbers. Patient made aware.

## 2020-03-29 ENCOUNTER — Other Ambulatory Visit: Payer: Self-pay

## 2020-03-29 MED ORDER — NOVOLIN 70/30 FLEXPEN (70-30) 100 UNIT/ML ~~LOC~~ SUPN: 20.0000 [IU] | PEN_INJECTOR | Freq: Two times a day (BID) | SUBCUTANEOUS | 5 refills | Status: DC

## 2020-03-29 MED ORDER — INSULIN PEN NEEDLE 32G X 4 MM MISC: 3 refills | Status: DC

## 2020-03-30 ENCOUNTER — Encounter: Payer: Self-pay | Admitting: Family Medicine

## 2020-03-31 ENCOUNTER — Ambulatory Visit: Payer: No Typology Code available for payment source | Admitting: Neurology

## 2020-03-31 ENCOUNTER — Other Ambulatory Visit: Payer: Self-pay

## 2020-03-31 DIAGNOSIS — R260 Ataxic gait: Secondary | ICD-10-CM

## 2020-03-31 DIAGNOSIS — Z794 Long term (current) use of insulin: Secondary | ICD-10-CM | POA: Diagnosis not present

## 2020-03-31 DIAGNOSIS — E1142 Type 2 diabetes mellitus with diabetic polyneuropathy: Secondary | ICD-10-CM | POA: Diagnosis not present

## 2020-03-31 DIAGNOSIS — E1140 Type 2 diabetes mellitus with diabetic neuropathy, unspecified: Secondary | ICD-10-CM | POA: Diagnosis not present

## 2020-03-31 NOTE — Progress Notes (Signed)
Follow-up Visit   Date: 03/31/20   [name] MRN: [id number] DOB: October 01, 1955   Interim History: [name] is a 64 y.o. right-handed Caucasian male with diabetes mellitus, hypertension, and nutrient deficiency returning to the clinic for follow-up of neurpathy.  The patient was accompanied to the clinic by wife who also provides collateral information.    History of present illness: He has been diabetic for the past 20 years.  He ran out of medications for about a year which lead to worsening diabetes.  Last HbA1c is 13.4.  He reports having cold in the feet for the past year and feels as if his hands feel are asleep.  He also has some weakness in the hands and feet. Imbalance started about a month ago.  He has been using a walker for the past several weeks.  He gets episodic sharp pain in the legs which was exacerbated by gabapentin, so stopped it.  He feels that his balance significantly got worse after being hospitalized with COVID. He is not vaccinated.   UPDATE 03/31/2020:  He is here for EDX testing and discuss results.  Since being compliant with medication, his HbA1c has dramatically improved from 13.4 >> 6.0.  He is also trying to improve his diet, but wife feels that he may not be eating enough.  Of note, his labs showed both thiamine and folate deficiency.  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  He is on short-term disability and may consider [place]-term disability, if neuropathy does not improve.   He also recently was diagnosed with cataracts and may need surgery because vision is blurred.    Medications:  Current Outpatient Medications on File Prior to Visit  Medication Sig Dispense Refill  . ACCU-CHEK GUIDE test strip USE AS DIRECTED EVERY DAY    . Accu-Chek Softclix Lancets lancets daily.    Marland Kitchen ALPRAZolam (XANAX) 0.5 MG tablet Take 1 tablet (0.5 mg total) by mouth 3 (three) times daily as needed for anxiety or sleep.  270 tablet 1  . Ascorbic Acid (VITAMIN C PO) Take by mouth.    . B Complex Vitamins (B COMPLEX 1 PO) Take by mouth.    . Blood Glucose Monitoring Suppl (ACCU-CHEK GUIDE ME) w/Device KIT USE AS DIRECTED EVERY DAY    . insulin isophane & regular human (NOVOLIN 70/30 FLEXPEN) (70-30) 100 UNIT/ML KwikPen Inject 20 Units into the skin in the morning and at bedtime. 20 mL 5  . Insulin Pen Needle 32G X 4 MM MISC Use as directed to inject novilin 100 each 3  . metoprolol succinate (TOPROL-XL) 25 MG 24 hr tablet Take 1 tablet (25 mg total) by mouth daily. 30 tablet 2  . temazepam (RESTORIL) 30 MG capsule Take 1 capsule (30 mg total) by mouth at bedtime as needed for sleep. 30 capsule 5   No current facility-administered medications on file prior to visit.    Allergies: No Known Allergies  Neurological Exam: Deferred, see note on 02/27/2020  Data: NCS/EMG of the left side 03/31/2020: The electrophysiologic findings are consistent with a severe polyradiculoneuropathy affecting the left side.  Lab Results  Component Value Date   HGBA1C 6.0 (H) 03/15/2020   Last vitamin B12 and Folate Lab Results  Component Value Date   VITAMINB12 669 10/31/2019   FOLATE 5.2 (L) 03/16/2020    Component     Latest Ref Rng & Units 03/16/2020  Vitamin B1 (  Thiamine)     8 - 30 nmol/L 6 (L)  Folate     >5.9 ng/mL 5.2 (L)   IMPRESSION/PLAN: 1. 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  HbA1c has markedly improved from 13 > 6.  It will be important for him to continue tight control of diabetes to optimize neural recovery Management for neuropathy is supportive Continue out-patient PT/OT No driving Fall prevention, always use walker Patient had many questions which I answered to the best of my ability.    2.  Folate and Thiamine deficiency neuropathy Continue folic acid 54m daily Continue thiamine 1018mdaily Encouraged a balanced nutritious diet  Return to  clinic in 4 months.   Total time spent reviewing records, interview, history/exam, documentation, counseling, and coordination of care on day of encounter:  30 min    Thank you for allowing me to participate in patient's care.  If I can answer any additional questions, I would be pleased to do so.    Sincerely,    Freddi Schrager K. PaPosey ProntoDO

## 2020-03-31 NOTE — Procedures (Signed)
[facility]  [address], [address]  [place], [place] [address] Tel: [phone] Fax:  [phone] Test Date:  03/31/2020  Patient: [name] DOB: September 26, 1955 Physician: Nita Sickle, DO  Sex: Male Height: 5\' 8"  Ref Phys: , DO  ID#: Nita Sickle Temp: 32.0C Technician:    Patient Complaints: This is a 64 year old man with diabetes mellitus referred for evaluation of generalized numbness and weakness.  NCV & EMG Findings: Extensive electrodiagnostic testing of the left upper and lower extremity shows:  1. All sensory responses including the left median, ulnar, radial, sural, and superficial peroneal nerves are absent. 2. Left median motor response shows prolonged latency (4.8 ms) and decreased conduction velocity (Elbow-Wrist, 42 m/s).  Left ulnar motor response shows prolonged latency (4.5 ms), reduced amplitude (1.5 mV), decreased conduction velocity (B Elbow-Wrist, 39 m/s), and decreased conduction velocity (A Elbow-B Elbow, 23 m/s).  Left peroneal (EBD) and tibial motor responses are absent.  Left peroneal motor response at the tibialis anterior shows reduced amplitude (2.5 mV). 3. Left tibial H reflex study is absent. 4. Chronic motor axonal loss changes are seen affecting a gradient pattern in the lower extremity as well as the distal hand muscles.  Fibrillation potentials are isolated to the left gastrocnemius muscles.  Active denervation is not seen in the cervical paraspinal muscles.  Impression: The electrophysiologic findings are consistent with a severe polyradiculoneuropathy affecting the left side.   ___________________________ 70, DO    Nerve Conduction Studies Anti Sensory Summary Table   Stim Site NR Peak (ms) Norm Peak (ms) P-T Amp (V) Norm P-T Amp  Left Median Anti Sensory (2nd Digit)  Wrist NR  <3.8  >10  Left Radial Anti Sensory (Base 1st Digit)  32C  Wrist NR  <2.8  >10  Left Sup Peroneal Anti Sensory (Ant Lat Mall)   32C  12 cm NR  <4.6  >3  Left Sural Anti Sensory (Lat Mall)  32C  Calf NR  <4.6  >3  Left Ulnar Anti Sensory (5th Digit)  32C  Wrist NR  <3.2  >5   Motor Summary Table   Stim Site NR Onset (ms) Norm Onset (ms) O-P Amp (mV) Norm O-P Amp Site1 Site2 Delta-0 (ms) Dist (cm) Vel (m/s) Norm Vel (m/s)  Left Median Motor (Abd Poll Brev)  32C  Wrist    4.8 <4.0 5.7 >5 Elbow Wrist 6.6 28.0 42 >50  Elbow    11.4  4.3         Left Peroneal Motor (Ext Dig Brev)  32C  Ankle NR  <6.0  >2.5 B Fib Ankle  0.0  >40  B Fib NR     Poplt B Fib  0.0  >40  Poplt NR            Left Peroneal TA Motor (Tib Ant)  32C  Fib Head    4.2 <4.5 2.5 >3 Poplit Fib Head 1.5 8.0 53 >40  Poplit    5.7  2.5         Left Tibial Motor (Abd Hall Brev)  32C  Ankle NR  <6.0  >4 Knee Ankle  0.0  >40  Knee NR            Left Ulnar Motor (Abd Dig Minimi)  32C  Wrist    4.5 <3.1 1.5 >7 B Elbow Wrist 5.4 21.0 39 >50  B Elbow    9.9  1.5  A Elbow B Elbow 4.3 10.0 23 >50  A Elbow    14.2  1.4          H Reflex Studies   NR H-Lat (ms) Lat Norm (ms) L-R H-Lat (ms)  Left Tibial (Gastroc)  32C  NR  <35    EMG   Side Muscle Ins Act Fibs Psw Fasc Number Recrt Dur Dur. Amp Amp. Poly Poly. Comment  Left AntTibialis Nml Nml Nml Nml 2- Rapid Some 1+ Some 1+ Nml Nml N/A  Left Gastroc Nml 1+ Nml Nml 2- Rapid Some 1+ Some 1+ Nml Nml N/A  Left Flex Dig Petrelli Nml Nml Nml Nml SMU Rapid All 1+ All 1+ All 1+ N/A  Left RectFemoris Nml Nml Nml Nml 2- Rapid Some 1+ Some 1+ Nml Nml N/A  Left GluteusMed Nml Nml Nml Nml Nml Nml Nml Nml Nml Nml Nml Nml N/A  Left 1stDorInt Nml Nml Nml Nml 3- Rapid Most 1+ Most 1+ Most 1+ ATR  Left Abd Poll Brev Nml Nml Nml Nml Nml Nml Nml Nml Nml Nml Nml Nml N/A  Left Ext Indicis Nml Nml Nml Nml 1- Rapid Some 1+ Some 1+ Some 1+ N/A  Left PronatorTeres Nml Nml Nml Nml Nml Nml Nml Nml Nml Nml Nml Nml N/A  Left Biceps Nml Nml Nml Nml Nml Nml Nml Nml Nml Nml Nml Nml N/A  Left Triceps Nml Nml Nml Nml Nml Nml  Nml Nml Nml Nml Nml Nml N/A  Left Deltoid Nml Nml Nml Nml Nml Nml Nml Nml Nml Nml Nml Nml N/A  Left Cervical Parasp Low Nml Nml Nml Nml Nml Nml Nml Nml Nml Nml Nml Nml N/A  Left ABD Dig Min Nml Nml Nml Nml SMU Rapid All 1+ All 1+ Nml Nml ATR      Waveforms:

## 2020-04-06 ENCOUNTER — Other Ambulatory Visit: Payer: Self-pay | Admitting: Family Medicine

## 2020-04-11 ENCOUNTER — Encounter: Payer: Self-pay | Admitting: Family Medicine

## 2020-04-12 ENCOUNTER — Telehealth: Payer: Self-pay | Admitting: *Deleted

## 2020-04-12 NOTE — Telephone Encounter (Signed)
Set up an OV to discuss this  

## 2020-04-12 NOTE — Telephone Encounter (Signed)
Appointment scheduled for Friday, 04/16/20 at 1400.

## 2020-04-12 NOTE — Telephone Encounter (Signed)
Patient called requesting to speak with Misty Stanley regarding an appointment. I asked the patient did he need to schedule an appointment if so I can help him with that. Patient stated him and Misty Stanley were working on the appointment and he doesn't want to many people working on it since [place] knows about it already.

## 2020-04-12 NOTE — Telephone Encounter (Signed)
Patient called and advised Dr. Clent Ridges would like for him to come to the office to discuss. He says he will have to find a ride. I advised to let me know via a MyChart message his availability for Wednesday or Friday and I schedule and respond.

## 2020-04-12 NOTE — Telephone Encounter (Signed)
Taken care of in the MyChart message encounter.

## 2020-04-16 ENCOUNTER — Ambulatory Visit: Payer: No Typology Code available for payment source | Admitting: Family Medicine

## 2020-04-16 ENCOUNTER — Other Ambulatory Visit: Payer: Self-pay

## 2020-04-16 ENCOUNTER — Encounter: Payer: Self-pay | Admitting: Family Medicine

## 2020-04-16 VITALS — BP 120/60 | HR 107 | Temp 98.1°F | Ht 68.0 in | Wt 163.4 lb

## 2020-04-16 DIAGNOSIS — I10 Essential (primary) hypertension: Secondary | ICD-10-CM | POA: Diagnosis not present

## 2020-04-16 DIAGNOSIS — Z794 Long term (current) use of insulin: Secondary | ICD-10-CM

## 2020-04-16 DIAGNOSIS — E1140 Type 2 diabetes mellitus with diabetic neuropathy, unspecified: Secondary | ICD-10-CM

## 2020-04-16 DIAGNOSIS — H269 Unspecified cataract: Secondary | ICD-10-CM | POA: Diagnosis not present

## 2020-04-16 NOTE — Progress Notes (Signed)
   Subjective:    Patient ID: [name], male    DOB: [birth year], 64 y.o.   MRN: [id number]  HPI Here with his wife to discuss his disability status. His diabetes has been very well controlled. His last A1c on 03-15-20 was 6.0. and his am fasting glucoses have been from 100-110 mostly. He has been seeing Dr. Allena Katz for diabetic polyneuropathy, and recent testing has found this to be "severe". He has very little feeling in the hands or the feet. He has started PT and OT to increase his strength and balance. His vision has been very poor, and his optometrist, Dr. Clydene Pugh, has diagnosed him with bilateral cataracts. Otherwise his eyes are fine and he has no signs of diabetic retinopathy. He will be be having cataract surgeries with Dr. Vonna Kotyk soon. We had originally written out of work from 01-19-20 to 04-26-20, but now with upcoming cataract surgeries, this will need to be postponed further.    Review of Systems  Constitutional: Negative.   Eyes: Positive for visual disturbance.  Respiratory: Negative.   Cardiovascular: Negative.   Neurological: Positive for weakness and numbness.       Objective:   Physical Exam Constitutional:      Comments: Walks with a walker  Cardiovascular:     Rate and Rhythm: Normal rate and regular rhythm.     Pulses: Normal pulses.     Heart sounds: Normal heart sounds.  Pulmonary:     Effort: Pulmonary effort is normal.     Breath sounds: Normal breath sounds.  Neurological:     Mental Status: He is alert.           Assessment & Plan:  His diabetes is now well controlled with insulin. He will continue PT and OT for the polyneuropathy. He will proceed with eye surgeries as above. We will now write him out of work through 06-27-20, and we will plan on him returning to work on 06-28-20. We spent 30 minutes discussing these issues.  [name], MD

## 2020-04-20 ENCOUNTER — Encounter: Payer: Self-pay | Admitting: Family Medicine

## 2020-04-20 NOTE — Telephone Encounter (Signed)
[employer] faxed forms  The forms have not been completed by the patient. The patient will probably have to be contacted to complete the forms before faxing.   Fax to: [phone]  Disposition: Dr's folder

## 2020-04-21 ENCOUNTER — Telehealth: Payer: Self-pay | Admitting: Family Medicine

## 2020-04-21 NOTE — Telephone Encounter (Signed)
No longer needed

## 2020-04-21 NOTE — Telephone Encounter (Signed)
Pt called to check the status of the STD form that was received on 04/20/2020 and pt is aware that his signature is needed for the form.  Pt stated that it is a re-certification for his STD and that they do not need that due to it not being the initial form.  Pt is aware that we ask for 3-5 business days for completion.

## 2020-04-22 ENCOUNTER — Other Ambulatory Visit: Payer: Self-pay | Admitting: *Deleted

## 2020-04-22 DIAGNOSIS — Z794 Long term (current) use of insulin: Secondary | ICD-10-CM

## 2020-04-22 MED ORDER — INSULIN PEN NEEDLE 29G X 12.7MM MISC: 3 refills | Status: DC

## 2020-04-23 ENCOUNTER — Telehealth: Payer: Self-pay | Admitting: Family Medicine

## 2020-04-23 NOTE — Telephone Encounter (Signed)
Patient called requesting to speak to Dr. Claris Che nurse regarding disability paperwork he faxed.

## 2020-04-26 NOTE — Telephone Encounter (Signed)
Pt is calling to check the status of the STD paperwork and he stated that his STD ran out on yesterday and would like to see if it can be filled out today so that he is able to continue to get paid.  Pt would like to have a call back [phone] to let him know the status of the pw sometime today.  Pt is aware that we ask for 3-5 business day to complete pw and he stated that it has been faxed here several times since 04/15/2020.

## 2020-04-27 ENCOUNTER — Encounter: Payer: Self-pay | Admitting: Family Medicine

## 2020-04-27 NOTE — Telephone Encounter (Signed)
Patient is aware that this has been faxed in & a copy was sent to him.

## 2020-04-27 NOTE — Telephone Encounter (Signed)
Pt is calling in to check the status of his STD paperwork and would like to see if someone can give him a call to let him know that the pw is ready for pickup b/c he needed it for work on yesterday.  Please give pt a call when ready.

## 2020-04-27 NOTE — Telephone Encounter (Signed)
This has been taken care of - see mychart encounter. Paperwork has been faxed & copy sent to scan.

## 2020-04-27 NOTE — Telephone Encounter (Signed)
Please let him know that this was faxed in

## 2020-05-17 ENCOUNTER — Encounter: Payer: Self-pay | Admitting: Family Medicine

## 2020-05-17 LAB — HM DIABETES EYE EXAM

## 2020-05-24 ENCOUNTER — Encounter: Payer: Self-pay | Admitting: Family Medicine

## 2020-05-25 MED ORDER — ACCU-CHEK GUIDE VI STRP
ORAL_STRIP | 3 refills | Status: DC
Start: 2020-05-25 — End: 2021-05-20

## 2020-05-25 NOTE — Telephone Encounter (Signed)
He should be check ing the glucoses twice a day.  Please sen din a new order for testing supplies for BID

## 2020-06-14 ENCOUNTER — Other Ambulatory Visit: Payer: Self-pay

## 2020-06-14 ENCOUNTER — Ambulatory Visit: Payer: No Typology Code available for payment source | Admitting: Family Medicine

## 2020-06-14 ENCOUNTER — Encounter: Payer: Self-pay | Admitting: Family Medicine

## 2020-06-14 VITALS — BP 138/60 | HR 90 | Wt 166.0 lb

## 2020-06-14 DIAGNOSIS — E1140 Type 2 diabetes mellitus with diabetic neuropathy, unspecified: Secondary | ICD-10-CM | POA: Diagnosis not present

## 2020-06-14 DIAGNOSIS — M25361 Other instability, right knee: Secondary | ICD-10-CM | POA: Diagnosis not present

## 2020-06-14 DIAGNOSIS — Z794 Long term (current) use of insulin: Secondary | ICD-10-CM

## 2020-06-14 LAB — POCT GLYCOSYLATED HEMOGLOBIN (HGB A1C): Hemoglobin A1C: 4.5 % (ref 4.0–5.6)

## 2020-06-14 MED ORDER — NOVOLIN 70/30 FLEXPEN (70-30) 100 UNIT/ML ~~LOC~~ SUPN
15.0000 [IU] | PEN_INJECTOR | Freq: Two times a day (BID) | SUBCUTANEOUS | 5 refills | Status: DC
Start: 2020-06-14 — End: 2020-10-04

## 2020-06-14 NOTE — Progress Notes (Signed)
   Subjective:    Patient ID: [name], male    DOB: 03/14/1956, 64 y.o.   MRN: [id number]  HPI Here for several issues. First we are following up on diabetes. He has been taking 20 units of insulin BID. His A1c today is down to 4.5. Also for the past 2 months he has felt instability in the right knee. No particular pain. Sometimes the knee simply gives way and he almost falls. He is using a walker to prevent this.    Review of Systems  Constitutional: Negative.   Respiratory: Negative.   Cardiovascular: Negative.   Musculoskeletal: Positive for gait problem.       Objective:   Physical Exam Cardiovascular:     Rate and Rhythm: Normal rate and regular rhythm.     Pulses: Normal pulses.     Heart sounds: Normal heart sounds.  Pulmonary:     Effort: Pulmonary effort is normal.     Breath sounds: Normal breath sounds.  Musculoskeletal:     Comments: The right knee appears normal. No swelling or tenderness, full ROM   Neurological:     Mental Status: He is alert.           Assessment & Plan:  His diabetes is well controlled. We will decrease the insulin to 15 units BID. Refer to Orthopedics for the knee instability.  [name], MD

## 2020-06-17 ENCOUNTER — Encounter: Payer: Self-pay | Admitting: Family Medicine

## 2020-06-21 ENCOUNTER — Encounter: Payer: Self-pay | Admitting: Family Medicine

## 2020-06-22 ENCOUNTER — Encounter: Payer: Self-pay | Admitting: Family Medicine

## 2020-06-22 NOTE — Telephone Encounter (Signed)
These forms are not in my folder

## 2020-06-22 NOTE — Telephone Encounter (Signed)
I do not have these forms  

## 2020-06-22 NOTE — Telephone Encounter (Signed)
The form is ready  

## 2020-07-06 ENCOUNTER — Encounter: Payer: Self-pay | Admitting: Family Medicine

## 2020-07-06 NOTE — Telephone Encounter (Signed)
I think that would be a reasonable goal, as Threat as Dr. Allena Katz agrees

## 2020-07-08 ENCOUNTER — Other Ambulatory Visit: Payer: Self-pay

## 2020-07-08 DIAGNOSIS — R269 Unspecified abnormalities of gait and mobility: Secondary | ICD-10-CM

## 2020-07-13 ENCOUNTER — Encounter: Payer: Self-pay | Admitting: Family Medicine

## 2020-07-15 DIAGNOSIS — Z0279 Encounter for issue of other medical certificate: Secondary | ICD-10-CM

## 2020-07-15 NOTE — Telephone Encounter (Signed)
The form is ready  

## 2020-08-01 ENCOUNTER — Other Ambulatory Visit: Payer: Self-pay | Admitting: Family Medicine

## 2020-08-02 ENCOUNTER — Encounter: Payer: Self-pay | Admitting: Neurology

## 2020-08-02 ENCOUNTER — Ambulatory Visit: Payer: No Typology Code available for payment source | Admitting: Neurology

## 2020-08-02 ENCOUNTER — Other Ambulatory Visit: Payer: Self-pay

## 2020-08-02 VITALS — BP 130/75 | HR 75 | Ht 68.0 in | Wt 169.0 lb

## 2020-08-02 DIAGNOSIS — E5111 Dry beriberi: Secondary | ICD-10-CM | POA: Diagnosis not present

## 2020-08-02 DIAGNOSIS — E1142 Type 2 diabetes mellitus with diabetic polyneuropathy: Secondary | ICD-10-CM

## 2020-08-02 DIAGNOSIS — E538 Deficiency of other specified B group vitamins: Secondary | ICD-10-CM

## 2020-08-02 NOTE — Patient Instructions (Addendum)
Check labs  Continue physical therapy  Return to clinic in 4 months

## 2020-08-02 NOTE — Progress Notes (Signed)
Follow-up Visit   Date: 08/02/20   SHAWNDELL [name] MRN: [id number] DOB: [birth year]   Interim History: [name] is a 65 y.o. right-handed Caucasian male with diabetes mellitus, hypertension, and nutrient deficiency returning to the clinic for follow-up of neurpathy.  The patient was accompanied to the clinic by wife who also provides collateral information.    History of present illness: He has been diabetic for the past 20 years.  He ran out of medications for about a year which lead to worsening diabetes.  Last HbA1c is 13.4.  He reports having cold in the feet for the past year and feels as if his hands feel are asleep.  He also has some weakness in the hands and feet. Imbalance started about a month ago.  He has been using a walker for the past several weeks.  He gets episodic sharp pain in the legs which was exacerbated by gabapentin, so stopped it.  He feels that his balance significantly got worse after being hospitalized with COVID. He is not vaccinated.   UPDATE 03/31/2020:  He is here for EDX testing and discuss results.  Since being compliant with medication, his HbA1c has dramatically improved from 13.4 >> 6.0.  He is also trying to improve his diet, but wife feels that he may not be eating enough.  Of note, his labs showed both thiamine and folate deficiency.  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  He is on short-term disability and may consider [employer]chtel-term disability, if neuropathy does not improve.   He also recently was diagnosed with cataracts and may need surgery because vision is blurred.   UPDATE 08/02/2020:  He is here for follow-up.  There has been no progressive weakness or numbness/tingling, but he continues to have baseline weakness and numbness in the hands and feet. His balance and leg strength has improved with PT. He uses a walker and has not had any falls.  He also underwent cataract surgery and returned to work.   He struggles with typing because he cannot tell where the keys are and the pressure applied.  He is planning on appealing his STD.  Medications:  Current Outpatient Medications on File Prior to Visit  Medication Sig Dispense Refill  . ACCU-CHEK GUIDE test strip Use to test blood sugars twice daily. 200 each 3  . Accu-Chek Softclix Lancets lancets daily.    Marland Kitchen ALPRAZolam (XANAX) 0.5 MG tablet Take 1 tablet (0.5 mg total) by mouth 3 (three) times daily as needed for anxiety or sleep. 270 tablet 1  . Ascorbic Acid (VITAMIN C PO) Take by mouth.    . B Complex Vitamins (B COMPLEX 1 PO) Take by mouth.    . Blood Glucose Monitoring Suppl (ACCU-CHEK GUIDE ME) w/Device KIT USE AS DIRECTED EVERY DAY    . insulin isophane & regular human (NOVOLIN 70/30 FLEXPEN) (70-30) 100 UNIT/ML KwikPen Inject 15 Units into the skin 2 (two) times daily with a meal. 20 mL 5  . Insulin Pen Needle 29G X 12.7MM MISC Use as directed to inject novilin 100 each 3  . metoprolol succinate (TOPROL-XL) 25 MG 24 hr tablet TAKE 1 TABLET BY MOUTH EVERY DAY 90 tablet 1  . temazepam (RESTORIL) 30 MG capsule Take 1 capsule (30 mg total) by mouth at bedtime as needed for sleep. 30 capsule 5  . BESIVANCE 0.6 % SUSP Apply to eye. (Patient not taking: Reported on 08/02/2020)    .  DUREZOL 0.05 % EMUL Apply to eye. (Patient not taking: Reported on 08/02/2020)    . Insulin Pen Needle 32G X 4 MM MISC Use as directed to inject novilin 100 each 3  . PROLENSA 0.07 % SOLN Apply to eye. (Patient not taking: Reported on 08/02/2020)     No current facility-administered medications on file prior to visit.    Allergies: No Known Allergies  Neurological Exam: MENTAL STATUS including orientation to time, place, person, recent and remote memory, attention span and concentration, language, and fund of knowledge is normal.  Speech is not dysarthric.  CRANIAL NERVES:  Normal conjugate, extra-ocular eye movements in all directions of gaze.  No ptosis. Normal  facial sensation.   MOTOR:  Moderate bilateral hand atrophy, No fasciculations or abnormal movements.  No pronator drift.   Upper Extremity:  Right  Left  Deltoid  5/5   5/5   Biceps  5/5   5/5   Triceps  5/5   5/5   Infraspinatus 5/5  5/5  Medial pectoralis 5/5  5/5  Wrist extensors  5/5   5/5   Wrist flexors  5/5   5/5   Finger extensors  4/5   4/5   Finger flexors  4/5   4/5   Dorsal interossei  4/5   4/5   Abductor pollicis  5/5   5/5   Tone (Ashworth scale)  0  0   Lower Extremity:  Right  Left  Hip flexors  5/5   5/5   Hip extensors  5/5   5/5   Adductor 5/5  5/5  Abductor 5/5  5/5  Knee flexors  5/5   5/5   Knee extensors  5/5   5/5   Dorsiflexors  4+/5   4+/5   Plantarflexors  5/5   5/5   Toe extensors  4/5   4/5   Tone (Ashworth scale)  0  0    MSRs:                                           Right        Left brachioradialis 2+  2+  biceps 2+  2+  triceps 2+  2+  patellar 1+  1+  ankle jerk 0  0   SENSORY:  Vibration trace at the ankles, intact above the knees and hands.   COORDINATION/GAIT:  Gait is wide-based with bilateral foot drop/high steppage, mild unsteadiness.    Data: NCS/EMG of the left side 03/31/2020: The electrophysiologic findings are consistent with a severe polyradiculoneuropathy affecting the left side.  Lab Results  Component Value Date   HGBA1C 4.5 06/14/2020   Last vitamin B12 and Folate Lab Results  Component Value Date   VITAMINB12 669 10/31/2019   FOLATE 5.2 (L) 03/16/2020     IMPRESSION/PLAN: 1.  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  Exam continues to show muscle weakness and atrophy in the hands > legs.  Ataxia has improved some, where he appears much more stable using a walker. He diabetes is under excellent control.   - Continue supportive care  - Continue PT  - Discussed starting occupational therapy   -Patient educated on daily foot inspection, fall  prevention, and safety precautions around the home.  - DMV handicap placard form completed  2.  Thiamine and folic acid deficiency neuropathy  -  Check folate, B12, thiamine  Return to clinic in 4 months   Thank you for allowing me to participate in patient's care.  If I can answer any additional questions, I would be pleased to do so.    Sincerely,    Ernesha Ramone K. Posey Pronto, DO

## 2020-08-03 NOTE — Telephone Encounter (Signed)
Last office visit- 06/14/2020 Last refill- 01/23/2020---30 capsules 5 refills

## 2020-08-09 ENCOUNTER — Other Ambulatory Visit (INDEPENDENT_AMBULATORY_CARE_PROVIDER_SITE_OTHER): Payer: No Typology Code available for payment source

## 2020-08-09 ENCOUNTER — Other Ambulatory Visit: Payer: Self-pay

## 2020-08-09 DIAGNOSIS — E1142 Type 2 diabetes mellitus with diabetic polyneuropathy: Secondary | ICD-10-CM

## 2020-08-09 LAB — B12 AND FOLATE PANEL
Folate: >23.6 ng/mL (ref >5.9)
Vitamin B-12: >1506 pg/mL — ABNORMAL HIGH (ref 211–911)

## 2020-08-14 LAB — VITAMIN B1: Vitamin B1 (Thiamine): 61 nmol/L — ABNORMAL HIGH (ref 8–30)

## 2020-08-16 ENCOUNTER — Telehealth: Payer: Self-pay

## 2020-08-16 NOTE — Telephone Encounter (Signed)
-----   Message from Glendale Chard, DO sent at 08/16/2020  9:24 AM EST ----- Please inform patient that his labs are much better.  I recommend that he continue all supplements for 3 months, then start a daily multivitamin and stop supplements. Thanks.

## 2020-08-16 NOTE — Telephone Encounter (Signed)
Pt called and informed labs are much better. I recommend that he continue all supplements for 3 months, then start a daily multivitamin and stop supplements

## 2020-09-24 ENCOUNTER — Other Ambulatory Visit: Payer: Self-pay | Admitting: Family Medicine

## 2020-10-02 ENCOUNTER — Other Ambulatory Visit: Payer: Self-pay | Admitting: Family Medicine

## 2020-10-02 DIAGNOSIS — E1140 Type 2 diabetes mellitus with diabetic neuropathy, unspecified: Secondary | ICD-10-CM

## 2020-10-04 ENCOUNTER — Other Ambulatory Visit: Payer: Self-pay

## 2020-10-04 DIAGNOSIS — E1140 Type 2 diabetes mellitus with diabetic neuropathy, unspecified: Secondary | ICD-10-CM

## 2020-10-04 DIAGNOSIS — Z794 Long term (current) use of insulin: Secondary | ICD-10-CM

## 2020-10-04 MED ORDER — INSULIN PEN NEEDLE 29G X 12.7MM MISC: 3 refills | Status: DC

## 2020-10-04 MED ORDER — NOVOLIN 70/30 FLEXPEN (70-30) 100 UNIT/ML ~~LOC~~ SUPN: 15.0000 [IU] | PEN_INJECTOR | Freq: Two times a day (BID) | SUBCUTANEOUS | 5 refills | Status: DC

## 2020-10-31 ENCOUNTER — Other Ambulatory Visit: Payer: Self-pay | Admitting: Family Medicine

## 2020-11-03 NOTE — Telephone Encounter (Signed)
Last office visit- 06/14/20 Last refill- 02/24/20-270 tabs with 1 refill  No future appointment scheduled

## 2020-11-04 ENCOUNTER — Encounter: Payer: Self-pay | Admitting: Family Medicine

## 2020-11-29 ENCOUNTER — Encounter: Payer: Self-pay | Admitting: Neurology

## 2020-11-29 ENCOUNTER — Other Ambulatory Visit: Payer: Self-pay

## 2020-11-29 ENCOUNTER — Ambulatory Visit: Payer: No Typology Code available for payment source | Admitting: Neurology

## 2020-11-29 VITALS — BP 150/76 | HR 75 | Ht 68.0 in | Wt 189.0 lb

## 2020-11-29 DIAGNOSIS — R29898 Other symptoms and signs involving the musculoskeletal system: Secondary | ICD-10-CM

## 2020-11-29 DIAGNOSIS — E1142 Type 2 diabetes mellitus with diabetic polyneuropathy: Secondary | ICD-10-CM

## 2020-11-29 NOTE — Patient Instructions (Signed)
Start occupational therapy for hand weakness  You may research hand assist devices to see what would help you

## 2020-11-29 NOTE — Progress Notes (Signed)
Follow-up Visit   Date: 11/29/20   [name] MRN: [id number] DOB: [birth year]   Interim History: [name] is a 65 y.o. right-handed Caucasian male with diabetes mellitus, hypertension, and nutrient deficiency returning to the clinic for follow-up of neurpathy.  The patient was accompanied to the clinic by wife who also provides collateral information.    History of present illness: He has been diabetic for the past 20 years.  He ran out of medications for about a year which lead to worsening diabetes.  Last HbA1c is 13.4.  He reports having cold in the feet for the past year and feels as if his hands feel are asleep.  He also has some weakness in the hands and feet. Imbalance started about a month ago.  He has been using a walker for the past several weeks.  He gets episodic sharp pain in the legs which was exacerbated by gabapentin, so stopped it.  He feels that his balance significantly got worse after being hospitalized with COVID. He is not vaccinated.   UPDATE 03/31/2020:  He is here for EDX testing and discuss results.  Since being compliant with medication, his HbA1c has dramatically improved from 13.4 >> 6.0.  He is also trying to improve his diet, but wife feels that he may not be eating enough.  Of note, his labs showed both thiamine and folate deficiency.  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  He is on short-term disability and may consider [employer]-term disability, if neuropathy does not improve.   He also recently was diagnosed with cataracts and may need surgery because vision is blurred.   UPDATE 08/02/2020:  He is here for follow-up.  There has been no progressive weakness or numbness/tingling, but he continues to have baseline weakness and numbness in the hands and feet. His balance and leg strength has improved with PT. He uses a walker and has not had any falls.  He also underwent cataract surgery and returned to work.   He struggles with typing because he cannot tell where the keys are and the pressure applied.  He is planning on appealing his STD.  UPDATE 11/29/2020:  He is here for 4 month follow-up.  For the last month, he has been using a cane and no longer a walker.  He has not suffered any falls.  There has been no improvement in hand weakness.  He struggles with holding utensils and other fine motor tasks.  He completed PT.  No new complaints.  Medications:  Current Outpatient Medications on File Prior to Visit  Medication Sig Dispense Refill   ACCU-CHEK GUIDE test strip Use to test blood sugars twice daily. 200 each 3   Accu-Chek Softclix Lancets lancets daily.     ALPRAZolam (XANAX) 0.5 MG tablet TAKE 1 TABLET (0.5 MG TOTAL) BY MOUTH 3 (THREE) TIMES DAILY AS NEEDED FOR ANXIETY OR SLEEP. 270 tablet 1   Blood Glucose Monitoring Suppl (ACCU-CHEK GUIDE ME) w/Device KIT USE AS DIRECTED EVERY DAY     insulin isophane & regular human (NOVOLIN 70/30 FLEXPEN) (70-30) 100 UNIT/ML KwikPen Inject 15 Units into the skin 2 (two) times daily with a meal. 20 mL 5   Insulin Pen Needle 29G X 12.7MM MISC Use as directed to inject novilin 100 each 3   metoprolol succinate (TOPROL-XL) 25 MG 24 hr tablet TAKE 1 TABLET BY MOUTH EVERY DAY 90 tablet 1   Multiple  Vitamin (MULTIVITAMIN) tablet Take 1 tablet by mouth daily.     temazepam (RESTORIL) 30 MG capsule TAKE 1 CAPSULE (30 MG TOTAL) BY MOUTH AT BEDTIME AS NEEDED FOR SLEEP. 30 capsule 5   Ascorbic Acid (VITAMIN C PO) Take by mouth. (Patient not taking: Reported on 11/29/2020)     B Complex Vitamins (B COMPLEX 1 PO) Take by mouth. (Patient not taking: Reported on 11/29/2020)     BESIVANCE 0.6 % SUSP Apply to eye. (Patient not taking: No sig reported)     DUREZOL 0.05 % EMUL Apply to eye. (Patient not taking: No sig reported)     Insulin Pen Needle 32G X 4 MM MISC Use as directed to inject novilin 100 each 3   PROLENSA 0.07 % SOLN Apply to eye. (Patient not taking: No sig  reported)     No current facility-administered medications on file prior to visit.    Allergies: No Known Allergies  Neurological Exam: MENTAL STATUS including orientation to time, place, person, recent and remote memory, attention span and concentration, language, and fund of knowledge is normal.  Speech is not dysarthric.  CRANIAL NERVES:  Normal conjugate, extra-ocular eye movements in all directions of gaze.  No ptosis. Normal facial sensation.   MOTOR:  Moderate-to-severe bilateral hand atrophy, No fasciculations or abnormal movements.  No pronator drift.   Upper Extremity:  Right  Left  Deltoid  5/5   5/5   Biceps  5/5   5/5   Triceps  5/5   5/5   Infraspinatus 5/5  5/5  Medial pectoralis 5/5  5/5  Wrist extensors  5/5   5/5   Wrist flexors  5/5   5/5   Finger extensors  4/5   4/5   Finger flexors  4/5   4/5   Dorsal interossei  4-/5   4-/5   Abductor pollicis  5/5   5/5   Tone (Ashworth scale)  0  0   Lower Extremity:  Right  Left  Hip flexors  5/5   5/5   Hip extensors  5/5   5/5   Adductor 5/5  5/5  Abductor 5/5  5/5  Knee flexors  5/5   5/5   Knee extensors  5/5   5/5   Dorsiflexors  5-/5   5-/5   Plantarflexors  5/5   5/5   Toe extensors  4/5   4/5   Tone (Ashworth scale)  0  0    MSRs:                                           Right        Left brachioradialis 2+  2+  biceps 2+  2+  triceps 2+  2+  patellar 1+  1+  ankle jerk 0  0   SENSORY:  Vibration trace at the ankles, intact above the knees and hands.   COORDINATION/GAIT:  Gait is wide-based with bilateral foot drop/high steppage, mild unsteadiness.    Data: NCS/EMG of the left side 03/31/2020: The electrophysiologic findings are consistent with a severe polyradiculoneuropathy affecting the left side.  Lab Results  Component Value Date   HGBA1C 4.5 06/14/2020   Last vitamin B12 and Folate Lab Results  Component Value Date   VITAMINB12 >1506 (H) 08/09/2020   FOLATE >23.6 08/09/2020      IMPRESSION/PLAN: 1.  Diabetic polyradiculoneuropathy  manifesting with severe distal sensorimotor deficits and ataxia.  Exam continues to show muscle weakness and atrophy in the hands > legs.  Ataxia has improved and he is now ambulating with a cane.  He diabetes is under excellent control.   - Start occupational therapy for hand weakness - Resources for hand assist devices offered - Fall precautions discussed  2.  Thiamine and folic acid deficiency neuropathy  - Normal levels, now transitioned to multivitamin   Return to clinic in 4 months   Thank you for allowing me to participate in patient's care.  If I can answer any additional questions, I would be pleased to do so.    Sincerely,    Adi Doro K. Posey Pronto, DO

## 2020-12-07 ENCOUNTER — Other Ambulatory Visit: Payer: Self-pay

## 2020-12-08 ENCOUNTER — Ambulatory Visit: Payer: No Typology Code available for payment source | Admitting: Family Medicine

## 2020-12-08 ENCOUNTER — Encounter: Payer: Self-pay | Admitting: Family Medicine

## 2020-12-08 VITALS — BP 128/80 | HR 74 | Temp 98.9°F | Wt 186.4 lb

## 2020-12-08 DIAGNOSIS — E1140 Type 2 diabetes mellitus with diabetic neuropathy, unspecified: Secondary | ICD-10-CM | POA: Diagnosis not present

## 2020-12-08 DIAGNOSIS — I10 Essential (primary) hypertension: Secondary | ICD-10-CM

## 2020-12-08 DIAGNOSIS — Z794 Long term (current) use of insulin: Secondary | ICD-10-CM | POA: Diagnosis not present

## 2020-12-08 DIAGNOSIS — E119 Type 2 diabetes mellitus without complications: Secondary | ICD-10-CM | POA: Diagnosis not present

## 2020-12-08 LAB — CBC WITH DIFFERENTIAL/PLATELET
Basophils Absolute: 0.1 10*3/uL (ref 0.0–0.1)
Basophils Relative: 1.3 % (ref 0.0–3.0)
Eosinophils Absolute: 0.2 10*3/uL (ref 0.0–0.7)
Eosinophils Relative: 2.5 % (ref 0.0–5.0)
HCT: 39 % (ref 39.0–52.0)
Hemoglobin: 13.6 g/dL (ref 13.0–17.0)
Lymphocytes Relative: 21.6 % (ref 12.0–46.0)
Lymphs Abs: 1.7 10*3/uL (ref 0.7–4.0)
MCHC: 34.9 g/dL (ref 30.0–36.0)
MCV: 96.6 fl (ref 78.0–100.0)
Monocytes Absolute: 0.5 10*3/uL (ref 0.1–1.0)
Monocytes Relative: 6 % (ref 3.0–12.0)
Neutro Abs: 5.4 10*3/uL (ref 1.4–7.7)
Neutrophils Relative %: 68.6 % (ref 43.0–77.0)
Platelets: 147 10*3/uL — ABNORMAL LOW (ref 150.0–400.0)
RBC: 4.04 Mil/uL — ABNORMAL LOW (ref 4.22–5.81)
RDW: 13.1 % (ref 11.5–15.5)
WBC: 7.9 10*3/uL (ref 4.0–10.5)

## 2020-12-08 LAB — BASIC METABOLIC PANEL
BUN: 21 mg/dL (ref 6–23)
CO2: 29 mEq/L (ref 19–32)
Calcium: 9.5 mg/dL (ref 8.4–10.5)
Chloride: 102 mEq/L (ref 96–112)
Creatinine, Ser: 0.9 mg/dL (ref 0.40–1.50)
GFR: 91.24 mL/min (ref >60.00)
Glucose, Bld: 63 mg/dL — ABNORMAL LOW (ref 70–99)
Potassium: 4.2 mEq/L (ref 3.5–5.1)
Sodium: 138 mEq/L (ref 135–145)

## 2020-12-08 LAB — HEPATIC FUNCTION PANEL
ALT: 18 U/L (ref 0–53)
AST: 19 U/L (ref 0–37)
Albumin: 4.2 g/dL (ref 3.5–5.2)
Alkaline Phosphatase: 70 U/L (ref 39–117)
Bilirubin, Direct: 0.3 mg/dL (ref 0.0–0.3)
Total Bilirubin: 1.3 mg/dL — ABNORMAL HIGH (ref 0.2–1.2)
Total Protein: 7.1 g/dL (ref 6.0–8.3)

## 2020-12-08 LAB — T3, FREE: T3, Free: 3.3 pg/mL (ref 2.3–4.2)

## 2020-12-08 LAB — T4, FREE: Free T4: 0.95 ng/dL (ref 0.60–1.60)

## 2020-12-08 LAB — POCT GLYCOSYLATED HEMOGLOBIN (HGB A1C): Hemoglobin A1C: 5.2 % (ref 4.0–5.6)

## 2020-12-08 LAB — TSH: TSH: 5.55 u[IU]/mL — ABNORMAL HIGH (ref 0.35–4.50)

## 2020-12-08 NOTE — Progress Notes (Signed)
   Subjective:    Patient ID: [name], male    DOB: August 20, 1955, 65 y.o.   MRN: [id number]  HPI    Review of Systems     Objective:   Physical Exam        Assessment & Plan:

## 2020-12-08 NOTE — Progress Notes (Signed)
    Subjective:    Patient ID: [name], male    DOB: September 10, 1955, 65 y.o.   MRN: [id number]  HPI Here to follow up on diabetes, neuropathy, and HTN. He has been steadily improving, and he has responded well to PT. His legs are stronger and his balance has improved. He now walks with a cane instead of a walker. He still has weakness and numbness in the hands. He saw Dr. Allena Katz a week ago, and she ordered OT to help with this. Recently saw his eye doctor, and he has referred him to see Dr. Maeola Sarah at [facility]. He is driving during the daytime. He is working full time from his home, and he says his boss has been very accommodating for him. He does note some ankle swelling in the past few weeks. This is not uncomfortable. No chest pain or SOB. His Aic this morning is 5.2. He is still taking 15 units of Novolog 70/30 BID. His BP is stable.    Review of Systems  Constitutional: Negative.   Respiratory: Negative.    Cardiovascular:  Positive for leg swelling. Negative for chest pain and palpitations.  Neurological:  Positive for weakness and numbness.      Objective:   Physical Exam Constitutional:      Appearance: He is not ill-appearing.  Cardiovascular:     Rate and Rhythm: Normal rate and regular rhythm.     Pulses: Normal pulses.     Heart sounds: Normal heart sounds.  Pulmonary:     Effort: Pulmonary effort is normal.     Breath sounds: Normal breath sounds.  Musculoskeletal:     Comments: 1+ bilateral ankle edema   Neurological:     Mental Status: He is alert.          Assessment & Plan:  His diabetes and HTN are quite well controlled. He is still dealing with significant neuropathy issues however. He will soon begin OT and this will be helpful. We will check labs today for renal function, etc. We spent 35 minutes discussing these issues today. [name], MD

## 2020-12-10 ENCOUNTER — Telehealth: Payer: Self-pay | Admitting: Family Medicine

## 2020-12-10 DIAGNOSIS — R7989 Other specified abnormal findings of blood chemistry: Secondary | ICD-10-CM

## 2020-12-10 MED ORDER — LEVOTHYROXINE SODIUM 50 MCG PO TABS: 50.0000 ug | ORAL_TABLET | Freq: Every day | ORAL | 3 refills | Status: DC

## 2020-12-10 NOTE — Telephone Encounter (Signed)
Patient returned a call regarding lab results.   Patient would like a call back at [phone].

## 2020-12-10 NOTE — Telephone Encounter (Signed)
Spoke with patient about results.  Lab appointment scheduled for 03/14/21.   Medication sent to [facility] in [place].

## 2020-12-10 NOTE — Telephone Encounter (Signed)
Pt is calling in stating that he would please like to have a call back today since he has called in x3.

## 2021-02-14 ENCOUNTER — Ambulatory Visit: Payer: No Typology Code available for payment source | Attending: Neurology | Admitting: Occupational Therapy

## 2021-02-14 DIAGNOSIS — M6281 Muscle weakness (generalized): Secondary | ICD-10-CM | POA: Insufficient documentation

## 2021-02-14 DIAGNOSIS — R208 Other disturbances of skin sensation: Secondary | ICD-10-CM | POA: Diagnosis present

## 2021-02-14 NOTE — Therapy (Signed)
[facility] [address] [place], [address] Phone: [phone]   Fax:  [phone]  Occupational Therapy Evaluation  Patient Details  Name: [name] MRN: [id number] Date of Birth: 1956-02-06 Referring Provider (OT): Dr Allena Katz   Encounter Date: 02/14/2021   OT End of Session - 02/14/21 1823     Visit Number 1    Number of Visits 10    Date for OT Re-Evaluation 04/25/21    OT Start Time 1420    OT Stop Time 1523    OT Time Calculation (min) 63 min    Activity Tolerance Patient tolerated treatment well    Behavior During Therapy [facility] for tasks assessed/performed             Past Medical History:  Diagnosis Date   Diabetes mellitus    Hematospermia    Hypertension    Nephrolithiasis 02/07/07   Viral meningitis     Past Surgical History:  Procedure Laterality Date   CHOLECYSTECTOMY  12-07-14   laparoscopic, per Dr. Rayburn Ma     There were no vitals filed for this visit.   Subjective Assessment - 02/14/21 1812     Subjective  Pt Diabetes got out of control last year and had COVID and since then hands and legs got weak - had PT and walking with cane now -but hands still weak and numb - hard time on computer , cut and eat with knife and fork, open packages, writing , pick up small objects and buttons    Pertinent History He has been diabetic for the past 20 years.  He ran out of medications for about a year which lead to worsening diabetes.  Last HbA1c is 13.4.  He reports having cold in the feet for the past year and feels as if his hands feel are asleep.  He also has some weakness in the hands and feet. Imbalance started about a month ago.  He has been using a walker for the past several weeks.  He gets episodic sharp pain in the legs which was exacerbated by gabapentin, so stopped it.  He feels that his balance significantly got worse after being hospitalized with COVID.  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  Exam continues to show muscle weakness and atrophy in the hands > legs.  Ataxia has improved and he is now ambulating with a cane.  He diabetes is under excellent control.                - Start occupational therapy for hand weakness  - Resources for hand assist devices offered  - Fall precautions discussed     2.  Thiamine and folic acid deficiency neuropathy               - Normal levels, now transitioned to multivitamin    Patient Stated Goals Want to see if I can get more strength in my hands and ability to perform tasks with more ease    Currently in Pain? 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, yardwork      AROM   Right Wrist Extension 55 Degrees    Right Wrist Flexion 85 Degrees    Left Wrist Extension 48 Degrees    Left Wrist Flexion 85 Degrees      Strength   Right Hand Grip (lbs) 50    Right Hand Lateral Pinch 10 lbs    Right Hand 3 Point Pinch 7 lbs    Left Hand Grip (lbs) 50    Left Hand Lateral Pinch 13 lbs    Left Hand 3 Point Pinch 5 lbs      Right Hand AROM   R Thumb MCP 0-60 45 Degrees    R Thumb IP 0-80 55 Degrees    R Thumb Radial ABduction/ADduction 0-55 45    R Thumb Palmar ABduction/ADduction 0-45 55    R Thumb Opposition to Index --   Lag 1 cm to 5th- 5th unable to oppose   R Little PIP 0-100 --   decrease ext of PIP - hyper ext of MC     Left Hand AROM   L Thumb MCP 0-60 45 Degrees    L Thumb IP 0-80 75 Degrees    L Thumb  Radial ADduction/ABduction 0-55 45    L Thumb Palmar ADduction/ABduction 0-45 50    L Thumb Opposition to Index --   opposition to base of 5th   L Little PIP 0-100 --   decrease ext , hyper ext of MC                   Contrast prior to review of HEP  AAROM for PA and RA of thumb  Opposition to pick up small objects alternate digits  10 reps Lumbrical fist - place and hold 5th digit 10 reps ADD of digits - sliding on table 10 reps Putty teal med for gripping and lat grip - can do 3 point pinch but 3 sec-          OT Education - 02/14/21 1822     Education Details findings of eval and HEP    Person(s) Educated Patient    Methods Explanation;Demonstration;Tactile cues;Verbal cues;Handout    Comprehension Verbal cues required;Returned demonstration;Verbalized understanding                 OT [place] Term Goals - 02/14/21 1830       OT [place] TERM GOAL #1   Title Pt to be independent in HEP to increase AROM and strength in bilateral hands to be ble to perform ADLs' with more ease    Baseline Grip 50 L and R, lat grip R 10 and L13 , 3 point grip R7 ,L 5 lbs - decrease lumbrical fist on 5th , ADD and opposition of 5th bilateral    Time 4    Period Weeks    Status New    Target Date 03/14/21      OT She[place] TERM GOAL #2   Title Pt to verbalize 3 modifications and or AE to use to increase ease of performing ADL's and IADL's    Baseline no knowledge    Time 10    Period Weeks    Status New    Target Date 04/25/21  OT [place]isch[place] TERM GOAL #3   Title 3 point pinch increase with 2 lbs to open packages and do buttons with more ease    Baseline R 7 , L 5 lbs 3 point pinch    Time 8    Period Weeks    Status New    Target Date 04/11/21                   Plan - 02/14/21 1823     Clinical Impression Statement Pt refer for bilateral UE diabetic neuropathy with worsening weakness and decrease use of bilateral hands in ADL's and IADL's - pt is Psychiatric nurse  and increase trouble typing , manipulate smaller objects, writing , using tools, opening packages and using untencils- pt show atrophy in thumb webspace , and interossie muscle , lumbrical , opposition of and ADD of bilateral 5th digits- decrease PIP ext of 5th - and decrease grip and prehension below range of his age - report pins /needles, hyper senstitivy , burning, numbness in bilateral hands - Will assess Phoebe Perch next visit.  Pt did had positive Tinel at [facility]. Pt ed on doing HEP for contrast , and AROM with some stretches. Pt can benefit from OT services to increase ROM , strength and ed on AE trng and modifications to increase independence    OT Occupational Profile and History Problem Focused Assessment - Including review of records relating to presenting problem    Occupational performance deficits (Please refer to evaluation for details): ADL's;IADL's;Work;Leisure;Play;Social Participation    Body Structure / Function / Physical Skills ADL;Dexterity;Decreased knowledge of use of DME;Strength;IADL;ROM;UE functional use;FMC;Decreased knowledge of precautions;Flexibility;Sensation    Rehab Potential Fair    Clinical Decision Making Limited treatment options, no task modification necessary    Comorbidities Affecting Occupational Performance: None    OT Frequency 1x / week    OT Duration --   10 wks   OT Treatment/Interventions Self-care/ADL training;Manual Therapy;Patient/family education;Therapeutic exercise;Contrast Bath;DME and/or AE instruction    Consulted and Agree with Plan of Care Patient             Patient will benefit from skilled therapeutic intervention in order to improve the following deficits and impairments:   Body Structure / Function / Physical Skills: ADL, Dexterity, Decreased knowledge of use of DME, Strength, IADL, ROM, UE functional use, FMC, Decreased knowledge of precautions, Flexibility, Sensation       Visit Diagnosis: Other disturbances  of skin sensation - Plan: Ot plan of care cert/re-cert  Muscle weakness (generalized) - Plan: Ot plan of care cert/re-cert    Problem List Patient Active Problem List   Diagnosis Date Noted   Type 2 diabetes mellitus with diabetic neuropathy, unspecified (HCC) 04/16/2020   Cataracts, bilateral 04/16/2020   Sinus tachycardia 01/14/2020   Insomnia 01/07/2020   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 2 diabetes mellitus (HCC) 07/27/2017   Generalized anxiety disorder 07/27/2017   Chronic right flank pain 12/20/2015   Hyperkalemia 10/21/2014   Chest pain 09/21/2014   ADHD 06/22/2010   ERECTILE DYSFUNCTION, 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 [place], [address] Phone: [phone]   Fax:  [phone]  Name: BISHOY CUPP MRN: [id number] Date of Birth: 12-02-1955

## 2021-02-16 ENCOUNTER — Other Ambulatory Visit: Payer: Self-pay | Admitting: Family Medicine

## 2021-02-17 NOTE — Telephone Encounter (Signed)
Pt LOV was on 12/08/2020 Last refill done on 08/03/2020 Please advise

## 2021-02-25 ENCOUNTER — Ambulatory Visit: Payer: No Typology Code available for payment source | Attending: Neurology | Admitting: Occupational Therapy

## 2021-02-25 ENCOUNTER — Other Ambulatory Visit: Payer: Self-pay

## 2021-02-25 ENCOUNTER — Encounter: Payer: No Typology Code available for payment source | Admitting: Occupational Therapy

## 2021-02-25 DIAGNOSIS — M6281 Muscle weakness (generalized): Secondary | ICD-10-CM | POA: Insufficient documentation

## 2021-02-25 DIAGNOSIS — R208 Other disturbances of skin sensation: Secondary | ICD-10-CM | POA: Diagnosis not present

## 2021-02-25 NOTE — Therapy (Signed)
[place] [facility] [address], [place], [address] Phone: [phone]   Fax:  [phone]  Occupational Therapy Treatment  Patient Details  Name: VALENTIN BENNEY MRN: [id number] Date of Birth: 04-11-1956 Referring Provider (OT): Dr Allena Katz   Encounter Date: 02/25/2021   OT End of Session - 02/25/21 1144     Visit Number 2    Number of Visits 10    Date for OT Re-Evaluation 04/25/21    OT Start Time 1050    OT Stop Time 1130    OT Time Calculation (min) 40 min    Activity Tolerance Patient tolerated treatment well    Behavior During Therapy [facility] for tasks assessed/performed             Past Medical History:  Diagnosis Date   Diabetes mellitus    Hematospermia    Hypertension    Nephrolithiasis 02/07/07   Viral meningitis     Past Surgical History:  Procedure Laterality Date   CHOLECYSTECTOMY  12-07-14   laparoscopic, per Dr. Rayburn Ma     There were no vitals filed for this visit.   Subjective Assessment - 02/25/21 1141     Subjective  Doing better- I can tell I have little more strength- picking up smaller objects easier-    Pertinent History He has been diabetic for the past 20 years.  He ran out of medications for about a year which lead to worsening diabetes.  Last HbA1c is 13.4.  He reports having cold in the feet for the past year and feels as if his hands feel are asleep.  He also has some weakness in the hands and feet. Imbalance started about a month ago.  He has been using a walker for the past several weeks.  He gets episodic sharp pain in the legs which was exacerbated by gabapentin, so stopped it.  He feels that his balance significantly got worse after being hospitalized with COVID.  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  Exam continues to show muscle weakness and atrophy in the hands > legs.  Ataxia has improved and he is  now ambulating with a cane.  He diabetes is under excellent control.                - Start occupational therapy for hand weakness  - Resources for hand assist devices offered  - Fall precautions discussed     2.  Thiamine and folic acid deficiency neuropathy               - Normal levels, now transitioned to multivitamin    Patient Stated Goals Want to see if I can get more strength in my hands and ability to perform tasks with more ease    Currently in Pain? No/denies                [facility] OT Assessment - 02/25/21 0001       AROM   Right Wrist Extension 62 Degrees    Right Wrist Flexion 85 Degrees    Left Wrist Extension 65 Degrees    Left Wrist Flexion 85 Degrees      Strength   Right Hand Grip (lbs) 61    Right Hand Lateral Pinch 11 lbs    Right Hand 3 Point Pinch 7.5 lbs    Left Hand Grip (lbs) 70    Left Hand Lateral Pinch 12 lbs  Left Hand 3 Point Pinch 7 lbs      Right Hand AROM   R Thumb MCP 0-60 50 Degrees    R Thumb IP 0-80 75 Degrees    R Thumb Radial ABduction/ADduction 0-55 55    R Thumb Palmar ABduction/ADduction 0-45 60    R Thumb Opposition to Index --   lagging 1/2 cm to 5th     Left Hand AROM   L Thumb MCP 0-60 60 Degrees    L Thumb IP 0-80 75 Degrees    L Thumb Radial ADduction/ABduction 0-55 55    L Thumb Palmar ADduction/ABduction 0-45 60    L Thumb Opposition to Index --   opposition lag 1/2 cm             AROM for bilateral hands and wrist extention  As well as grip and prehension - see flow sheet  Great progress     Contrast prior to review of HEP to cont with  Increase wrist ext - table slides 20 reps  AAROM for PA and RA of thumb  and add rubber band to them - 12 reps- increase to 2nd and 3rd set over the next week  Opposition to pick up small objects alternate digits  10 reps Lumbrical fist - place and hold 5th digit 10 reps ADD of digits - sliding on table 10 reps -change to place and hold on R , and L pick up think foam  inbetween 4th and 5th  and release Putty teal med for gripping, lat grip and 3 point  pinch  Into ball 12 reps- increase to 2 sets - and then 3 sets - in few days- but then one time day              OT Education - 02/25/21 1143     Education Details progress and changes to HEP    Person(s) Educated Patient    Methods Explanation;Demonstration;Tactile cues;Verbal cues;Handout    Comprehension Verbal cues required;Returned demonstration;Verbalized understanding                 OT Brister Term Goals - 02/14/21 1830       OT Nelon TERM GOAL #1   Title Pt to be independent in HEP to increase AROM and strength in bilateral hands to be ble to perform ADLs' with more ease    Baseline Grip 50 L and R, lat grip R 10 and L13 , 3 point grip R7 ,L 5 lbs - decrease lumbrical fist on 5th , ADD and opposition of 5th bilateral    Time 4    Period Weeks    Status New    Target Date 03/14/21      OT Esquer TERM GOAL #2   Title Pt to verbalize 3 modifications and or AE to use to increase ease of performing ADL's and IADL's    Baseline no knowledge    Time 10    Period Weeks    Status New    Target Date 04/25/21      OT Jerde TERM GOAL #3   Title 3 point pinch increase with 2 lbs to open packages and do buttons with more ease    Baseline R 7 , L 5 lbs 3 point pinch    Time 8    Period Weeks    Status New    Target Date 04/11/21  Plan - 02/25/21 1144     Clinical Impression Statement Pt refer for bilateral UE diabetic neuropathy with worsening weakness and decrease use of bilateral hands in ADL's and IADL's - pt is Psychiatric nurse and increase trouble typing , manipulate smaller objects, writing , using tools, opening packages and using untencils- pt show atrophy in thumb webspace , and interossie muscle , lumbrical , opposition of and ADD of bilateral 5th digits- decrease PIP ext of 5th - and decrease grip and prehension below range of his age - report pins  /needles, hyper senstitivy , burning, numbness in bilateral hands -  Pt this date showed great progress in grip more than Prehension strength- wrist ext , thumb PA and RA -and report some progress in functional use - Will assess Phoebe Perch next visit.  Pt positive Tinel at R Cubital tunnel still. Pt ed on changes to HEP   , and AROM with some stretches. Pt can benefit from OT services to increase ROM , strength and ed on AE trng and modifications to increase independence    OT Occupational Profile and History Problem Focused Assessment - Including review of records relating to presenting problem    Occupational performance deficits (Please refer to evaluation for details): ADL's;IADL's;Work;Leisure;Play;Social Participation    Body Structure / Function / Physical Skills ADL;Dexterity;Decreased knowledge of use of DME;Strength;IADL;ROM;UE functional use;FMC;Decreased knowledge of precautions;Flexibility;Sensation    Rehab Potential Fair    Clinical Decision Making Limited treatment options, no task modification necessary    Comorbidities Affecting Occupational Performance: None    Modification or Assistance to Complete Evaluation  No modification of tasks or assist necessary to complete eval    OT Frequency 1x / week    OT Duration 8 weeks    OT Treatment/Interventions Self-care/ADL training;Manual Therapy;Patient/family education;Therapeutic exercise;Contrast Bath;DME and/or AE instruction    Consulted and Agree with Plan of Care Patient             Patient will benefit from skilled therapeutic intervention in order to improve the following deficits and impairments:   Body Structure / Function / Physical Skills: ADL, Dexterity, Decreased knowledge of use of DME, Strength, IADL, ROM, UE functional use, FMC, Decreased knowledge of precautions, Flexibility, Sensation       Visit Diagnosis: Other disturbances of skin sensation  Muscle weakness (generalized)    Problem List Patient  Active Problem List   Diagnosis Date Noted   Type 2 diabetes mellitus with diabetic neuropathy, unspecified (HCC) 04/16/2020   Cataracts, bilateral 04/16/2020   Sinus tachycardia 01/14/2020   Insomnia 01/07/2020   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 2 diabetes mellitus (HCC) 07/27/2017   Generalized anxiety disorder 07/27/2017   Chronic right flank pain 12/20/2015   Hyperkalemia 10/21/2014   Chest pain 09/21/2014   ADHD 06/22/2010   ERECTILE DYSFUNCTION, 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, [place], [address] Phone: [phone]   Fax:  [phone]  Name: KNOAH NEDEAU MRN: [id number] Date of Birth: 03-17-1956

## 2021-03-07 ENCOUNTER — Ambulatory Visit: Payer: No Typology Code available for payment source | Admitting: Occupational Therapy

## 2021-03-14 ENCOUNTER — Other Ambulatory Visit: Payer: Self-pay

## 2021-03-14 ENCOUNTER — Other Ambulatory Visit (INDEPENDENT_AMBULATORY_CARE_PROVIDER_SITE_OTHER): Payer: No Typology Code available for payment source

## 2021-03-14 ENCOUNTER — Ambulatory Visit: Payer: No Typology Code available for payment source | Admitting: Occupational Therapy

## 2021-03-14 DIAGNOSIS — R208 Other disturbances of skin sensation: Secondary | ICD-10-CM | POA: Diagnosis not present

## 2021-03-14 DIAGNOSIS — M6281 Muscle weakness (generalized): Secondary | ICD-10-CM

## 2021-03-14 DIAGNOSIS — R7989 Other specified abnormal findings of blood chemistry: Secondary | ICD-10-CM

## 2021-03-14 NOTE — Therapy (Signed)
[place] [facility] [address] [place], [address] Phone: [phone]   Fax:  [phone]  Occupational Therapy Treatment  Patient Details  Name: [name] MRN: [id number] Date of Birth: 07/24/1955 Referring Provider (OT): Dr Allena Katz   Encounter Date: 03/14/2021   OT End of Session - 03/14/21 1203     Visit Number 3    Number of Visits 10    Date for OT Re-Evaluation 04/25/21    OT Start Time 1115    OT Stop Time 1159    OT Time Calculation (min) 44 min    Activity Tolerance Patient tolerated treatment well    Behavior During Therapy [facility] for tasks assessed/performed             Past Medical History:  Diagnosis Date   Diabetes mellitus    Hematospermia    Hypertension    Nephrolithiasis 02/07/07   Viral meningitis     Past Surgical History:  Procedure Laterality Date   CHOLECYSTECTOMY  12-07-14   laparoscopic, per Dr. Rayburn Ma     There were no vitals filed for this visit.   Subjective Assessment - 03/14/21 1203     Subjective  Better- I can open bottles, do buttons-  still hard to hold knife to cut and wide jar - pinkie still weak    Pertinent History He has been diabetic for the past 20 years.  He ran out of medications for about a year which lead to worsening diabetes.  Last HbA1c is 13.4.  He reports having cold in the feet for the past year and feels as if his hands feel are asleep.  He also has some weakness in the hands and feet. Imbalance started about a month ago.  He has been using a walker for the past several weeks.  He gets episodic sharp pain in the legs which was exacerbated by gabapentin, so stopped it.  He feels that his balance significantly got worse after being hospitalized with COVID.  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  Exam continues to show muscle weakness and atrophy in the hands > legs.  Ataxia has  improved and he is now ambulating with a cane.  He diabetes is under excellent control.                - Start occupational therapy for hand weakness  - Resources for hand assist devices offered  - Fall precautions discussed     2.  Thiamine and folic acid deficiency neuropathy               - Normal levels, now transitioned to multivitamin    Patient Stated Goals Want to see if I can get more strength in my hands and ability to perform tasks with more ease    Currently in Pain? No/denies                [facility] OT Assessment - 03/14/21 0001       AROM   Right Wrist Extension 70 Degrees    Right Wrist Flexion 95 Degrees    Left Wrist Extension 70 Degrees    Left Wrist Flexion 90 Degrees      Strength   Right Hand Grip (lbs) 64    Right Hand Lateral Pinch 10 lbs    Right Hand 3 Point Pinch 9 lbs    Left Hand Grip (lbs) 70    Left  Hand Lateral Pinch 11 lbs    Left Hand 3 Point Pinch 8 lbs      Right Hand AROM   R Thumb MCP 0-60 60 Degrees    R Thumb IP 0-80 85 Degrees    R Thumb Radial ABduction/ADduction 0-55 55    R Thumb Palmar ABduction/ADduction 0-45 60    R Thumb Opposition to Index --   lag 1/2 cm to 5th - and decrease 5th opp     Left Hand AROM   L Thumb MCP 0-60 60 Degrees    L Thumb IP 0-80 85 Degrees    L Thumb Radial ADduction/ABduction 0-55 55    L Thumb Palmar ADduction/ABduction 0-45 60    L Thumb Opposition to Index --   Opposition to 5th - decrease 5th Opp                AROM for R wrist increase to WNL - and L cont to be WNL - strength in all planes for wrist 5/5 As well as grip and prehension assess- grip about same -but 3 point increased - see flow sheet  Great progress again         Increase wrist ext - cont with  table slides 20 reps  AAROM for PA and RA of thumb  and cont  rubber band to them - 12 reps-3rd set 1 x day Opposition to pick up small objects alternate digits ( focus on 5th ) 10 reps Lumbrical fist - place and hold 5th digit  10 reps ADD of digits - sliding on table 10 reps -change to place and hold on R , and L pick up think foam inbetween 4th and 5th  and release Putty upgrade to med green for gripping, lat grip and 3 point  pinch  Into ball 12 reps- increase to 2 sets - and then 3 sets over the next 3 wks  And prehension into short sausage -  Semmes Weinstein sensation test - normal sensation 2.83 on all digits except Middle and DIP of bilateral 5th digits Tinel negative on Cubital tunnel -had Tinel at base of 5th digits this date                   OT Education - 03/14/21 1203     Education Details progress and changes to HEP    Person(s) Educated Patient    Methods Explanation;Demonstration;Tactile cues;Verbal cues;Handout    Comprehension Verbal cues required;Returned demonstration;Verbalized understanding                 OT Galvao Term Goals - 02/14/21 1830       OT Fort TERM GOAL #1   Title Pt to be independent in HEP to increase AROM and strength in bilateral hands to be ble to perform ADLs' with more ease    Baseline Grip 50 L and R, lat grip R 10 and L13 , 3 point grip R7 ,L 5 lbs - decrease lumbrical fist on 5th , ADD and opposition of 5th bilateral    Time 4    Period Weeks    Status New    Target Date 03/14/21      OT Ravelo TERM GOAL #2   Title Pt to verbalize 3 modifications and or AE to use to increase ease of performing ADL's and IADL's    Baseline no knowledge    Time 10    Period Weeks    Status New    Target Date  04/25/21      OT Mcpeters TERM GOAL #3   Title 3 point pinch increase with 2 lbs to open packages and do buttons with more ease    Baseline R 7 , L 5 lbs 3 point pinch    Time 8    Period Weeks    Status New    Target Date 04/11/21                   Plan - 03/14/21 1204     Clinical Impression Statement Pt refer for bilateral UE diabetic neuropathy with worsening weakness and decrease use of bilateral hands in ADL's and IADL's - pt is  Psychiatric nurse and increase trouble typing , manipulate smaller objects, writing , using tools, opening packages and using untencils- pt show atrophy in thumb webspace , and interossie muscle , lumbrical , opposition of and ADD of bilateral 5th digits- decrease PIP ext of 5th - and decrease grip and prehension below range of his age. lPt seen this date for 2nd tx session - pt showed great progress in grip by 14 and 20 lbs , prehension improving but limited by hyper flexion of IP because of ulnar N involvement- pt do not complain of numbness , pins and needls or burning pain anymore - now more of pain or stiffness in the bones - Semmes Weinstein sensation done this date and normal sensation except middle and DIP of bilateral 5th - Tinel at base of 5th.  Cont to be limited by ADD, Opposition and Froments sign on bilateral hands - pt to cont with HEP for 3 wks -did upgrade putty resistance. This date was negative Tinel at  Cubital tunnel. Pt ed on changes to HEP   , and AROM with some stretches. Pt can benefit from OT services to increase ROM , strength and ed on AE trng and modifications to increase independence    OT Occupational Profile and History Problem Focused Assessment - Including review of records relating to presenting problem    Occupational performance deficits (Please refer to evaluation for details): ADL's;IADL's;Work;Leisure;Play;Social Participation    Body Structure / Function / Physical Skills ADL;Dexterity;Decreased knowledge of use of DME;Strength;IADL;ROM;UE functional use;FMC;Decreased knowledge of precautions;Flexibility;Sensation    Rehab Potential Fair    Clinical Decision Making Limited treatment options, no task modification necessary    Comorbidities Affecting Occupational Performance: None    Modification or Assistance to Complete Evaluation  No modification of tasks or assist necessary to complete eval    OT Frequency Biweekly    OT Duration 6 weeks    OT  Treatment/Interventions Self-care/ADL training;Manual Therapy;Patient/family education;Therapeutic exercise;Contrast Bath;DME and/or AE instruction    Consulted and Agree with Plan of Care Patient             Patient will benefit from skilled therapeutic intervention in order to improve the following deficits and impairments:   Body Structure / Function / Physical Skills: ADL, Dexterity, Decreased knowledge of use of DME, Strength, IADL, ROM, UE functional use, FMC, Decreased knowledge of precautions, Flexibility, Sensation       Visit Diagnosis: Other disturbances of skin sensation  Muscle weakness (generalized)    Problem List Patient Active Problem List   Diagnosis Date Noted   Type 2 diabetes mellitus with diabetic neuropathy, unspecified (HCC) 04/16/2020   Cataracts, bilateral 04/16/2020   Sinus tachycardia 01/14/2020   Insomnia 01/07/2020   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 2 diabetes mellitus (HCC) 07/27/2017   Generalized anxiety disorder 07/27/2017  Chronic right flank pain 12/20/2015   Hyperkalemia 10/21/2014   Chest pain 09/21/2014   ADHD 06/22/2010   ERECTILE DYSFUNCTION, 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 [place], [address] Phone: [phone]   Fax:  [phone]  Name: [name] MRN: [id number] Date of Birth: 29-Nov-1955

## 2021-03-15 LAB — THYROID PANEL WITH TSH
Free Thyroxine Index: 2 (ref 1.2–4.9)
T3 Uptake Ratio: 32 % (ref 24–39)
T4, Total: 6.3 ug/dL (ref 4.5–12.0)
TSH: 3.24 u[IU]/mL (ref 0.450–4.500)

## 2021-03-18 ENCOUNTER — Other Ambulatory Visit: Payer: Self-pay | Admitting: Family Medicine

## 2021-04-04 ENCOUNTER — Ambulatory Visit: Payer: No Typology Code available for payment source | Attending: Neurology | Admitting: Occupational Therapy

## 2021-04-04 DIAGNOSIS — R208 Other disturbances of skin sensation: Secondary | ICD-10-CM | POA: Diagnosis not present

## 2021-04-04 DIAGNOSIS — M6281 Muscle weakness (generalized): Secondary | ICD-10-CM | POA: Diagnosis present

## 2021-04-04 NOTE — Therapy (Signed)
[facility] [address], [place], [address] Phone: [phone]   Fax:  [phone]  Occupational Therapy Treatment  Patient Details  Name: [name] MRN: [id number] Date of Birth: 11-Mar-1956 Referring Provider (OT): Dr Allena Katz   Encounter Date: 04/04/2021   OT End of Session - 04/04/21 1204     Visit Number 4    Number of Visits 10    Date for OT Re-Evaluation 04/25/21    OT Start Time 1123    OT Stop Time 1157    OT Time Calculation (min) 34 min    Activity Tolerance Patient tolerated treatment well    Behavior During Therapy [facility] for tasks assessed/performed             Past Medical History:  Diagnosis Date   Diabetes mellitus    Hematospermia    Hypertension    Nephrolithiasis 02/07/07   Viral meningitis     Past Surgical History:  Procedure Laterality Date   CHOLECYSTECTOMY  12-07-14   laparoscopic, per Dr. Rayburn Ma     There were no vitals filed for this visit.   Subjective Assessment - 04/04/21 1203     Subjective  I used the hand saw yesterday and my hand was little sore- I did check I sleep with my hand sometimes across my chest    Pertinent History He has been diabetic for the past 20 years.  He ran out of medications for about a year which lead to worsening diabetes.  Last HbA1c is 13.4.  He reports having cold in the feet for the past year and feels as if his hands feel are asleep.  He also has some weakness in the hands and feet. Imbalance started about a month ago.  He has been using a walker for the past several weeks.  He gets episodic sharp pain in the legs which was exacerbated by gabapentin, so stopped it.  He feels that his balance significantly got worse after being hospitalized with COVID.  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  Exam continues to show muscle weakness and atrophy in the hands > legs.   Ataxia has improved and he is now ambulating with a cane.  He diabetes is under excellent control.                - Start occupational therapy for hand weakness  - Resources for hand assist devices offered  - Fall precautions discussed     2.  Thiamine and folic acid deficiency neuropathy               - Normal levels, now transitioned to multivitamin    Patient Stated Goals Want to see if I can get more strength in my hands and ability to perform tasks with more ease    Currently in Pain? No/denies                [facility] OT Assessment - 04/04/21 0001       Strength   Right Hand Grip (lbs) 64    Right Hand Lateral Pinch 10 lbs    Right Hand 3 Point Pinch 9 lbs    Left Hand Grip (lbs) 70    Left Hand Lateral Pinch 12 lbs    Left Hand 3 Point Pinch 8 lbs              AROM for R wrist increase to WNL -  and L cont to be WNL - strength in all planes for wrist 5/5 As well as grip and prehension assess- grip  and prehension about same - see flow sheet            Cont with table slides 20 reps  AAROM for PA and RA of thumb  and cont  rubber band to them - 12 reps-3rd set 1 x day Opposition to pick up small objects alternate digits ( focus on 5th ) 10 reps Lumbrical fist - place and hold 5th digit 10 reps ADD of digits - sliding on table 10 reps -change to place and hold on R , and L pick up think foam inbetween 4th and 5th  and release L better than R - and sensation  Putty upgrade to  firm blue r gripping  Into ball 12 reps-  and pulling with ulnar side of hand  increase to 2 sets - and then 3 sets over the next 4 wks And prehension into short sausage - and lat grip -upgrade to green med firm  [facility] sensation test - normal sensation 2.83 on all digits except  proximal of 5th on R , and Middle and DIP of bilateral 5th digits Tinel negative on Cubital tunnel -had Tinel at base of 5th digits this date   Review with sleeping positions                 OT  Education - 04/04/21 1204     Education Details progress and changes to HEP    Person(s) Educated Patient    Methods Explanation;Demonstration;Tactile cues;Verbal cues;Handout    Comprehension Verbal cues required;Returned demonstration;Verbalized understanding                 OT [place] Term Goals - 04/04/21 1208       OT Pruss TERM GOAL #1   Title Pt to be independent in HEP to increase AROM and strength in bilateral hands to be ble to perform ADLs' with more ease    Baseline Grip 50 L and R, lat grip R 10 and L13 , 3 point grip R7 ,L 5 lbs - decrease lumbrical fist on 5th , ADD and opposition of 5th bilateral    Status Achieved      OT [place] TERM GOAL #2   Title Pt to verbalize 3 modifications and or AE to use to increase ease of performing ADL's and IADL's    Status Achieved      OT Mian TERM GOAL #3   Title 3 point pinch increase with 2 lbs to open packages and do buttons with more ease    Baseline prehension increase to 8-10 lbs - can open packages and buttons with more ease    Status Achieved                   Plan - 04/04/21 1204     Clinical Impression Statement Pt refer for bilateral UE diabetic neuropathy with worsening weakness and decrease use of bilateral hands in ADL's and IADL's - pt is Psychiatric nurse and increase trouble typing , manipulate smaller objects, writing , using tools, opening packages and using untencils- pt show atrophy in thumb webspace , and interossie muscle , lumbrical , opposition of and ADD of bilateral 5th digits- decrease PIP ext of 5th - and decrease grip and prehension below range of his age. IN 3 sessions pt made great progress in grip  and prehension strength but cont to  show flexion of 5th IP R worse than L - pt only report some pins and needles in the 5th digits.  [place]yl [place] Weinstein sensation done this date again cont to show normal sensation except middle and DIP of bilateral 5th - Tinel at base of 5th.  Cont to be limited by  ADD, Opposition and Froments sign on bilateral hands - pt to cont with HEP for month -did upgrade putty resistance again.  Pt cont to be negative Tinel at  Cubital tunnel. Pt ed on changes to HEP and nerve healing   , and AROM with some stretches. Pt to follow up in month    OT Occupational Profile and History Problem Focused Assessment - Including review of records relating to presenting problem    Occupational performance deficits (Please refer to evaluation for details): ADL's;IADL's;Work;Leisure;Play;Social Participation    Body Structure / Function / Physical Skills ADL;Dexterity;Decreased knowledge of use of DME;Strength;IADL;ROM;UE functional use;FMC;Decreased knowledge of precautions;Flexibility;Sensation    Clinical Decision Making Limited treatment options, no task modification necessary    Comorbidities Affecting Occupational Performance: None    Modification or Assistance to Complete Evaluation  No modification of tasks or assist necessary to complete eval    OT Frequency Monthly    OT Duration 4 weeks    OT Treatment/Interventions Self-care/ADL training;Manual Therapy;Patient/family education;Therapeutic exercise;Contrast Bath;DME and/or AE instruction    Consulted and Agree with Plan of Care Patient             Patient will benefit from skilled therapeutic intervention in order to improve the following deficits and impairments:   Body Structure / Function / Physical Skills: ADL, Dexterity, Decreased knowledge of use of DME, Strength, IADL, ROM, UE functional use, FMC, Decreased knowledge of precautions, Flexibility, Sensation       Visit Diagnosis: Other disturbances of skin sensation  Muscle weakness (generalized)    Problem List Patient Active Problem List   Diagnosis Date Noted   Type 2 diabetes mellitus with diabetic neuropathy, unspecified (HCC) 04/16/2020   Cataracts, bilateral 04/16/2020   Sinus tachycardia 01/14/2020   Insomnia 01/07/2020   Neuropathy due  to type 2 diabetes mellitus (HCC) 07/27/2017   Generalized anxiety disorder 07/27/2017   Chronic right flank pain 12/20/2015   Hyperkalemia 10/21/2014   Chest pain 09/21/2014   ADHD 06/22/2010   ERECTILE DYSFUNCTION, 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 [place], [address] Phone: [phone]   Fax:  [phone]  Name: [name] MRN: [id number] Date of Birth: 03-15-1956

## 2021-05-10 ENCOUNTER — Ambulatory Visit: Payer: No Typology Code available for payment source | Attending: Neurology | Admitting: Occupational Therapy

## 2021-05-10 DIAGNOSIS — R208 Other disturbances of skin sensation: Secondary | ICD-10-CM | POA: Diagnosis not present

## 2021-05-10 DIAGNOSIS — M6281 Muscle weakness (generalized): Secondary | ICD-10-CM | POA: Diagnosis present

## 2021-05-10 NOTE — Therapy (Signed)
[facility] [address], [place], [address] Phone: [phone]   Fax:  [phone]  Occupational Therapy Treatment  Patient Details  Name: [name] MRN: [id number] Date of Birth: 27-Jun-1955 Referring Provider (OT): Dr Allena Katz   Encounter Date: 05/10/2021   OT End of Session - 05/10/21 1354     Visit Number 5    Number of Visits 6    Date for OT Re-Evaluation 07/05/21    OT Start Time 1315    OT Stop Time 1354    OT Time Calculation (min) 39 min    Activity Tolerance Patient tolerated treatment well    Behavior During Therapy [facility] for tasks assessed/performed             Past Medical History:  Diagnosis Date   Diabetes mellitus    Hematospermia    Hypertension    Nephrolithiasis 02/07/07   Viral meningitis     Past Surgical History:  Procedure Laterality Date   CHOLECYSTECTOMY  12-07-14   laparoscopic, per Dr. Rayburn Ma     There were no vitals filed for this visit.   Subjective Assessment - 05/10/21 1353     Subjective  I am doing better- about same as last time I think - I am just trying to use them normally - I can pick up now more smaller screws , and numbness in pinkies better- but they can be in this cold weather stiff and ache at times - I am doing all my exercises with the dark blue putty    Pertinent History He has been diabetic for the past 20 years.  He ran out of medications for about a year which lead to worsening diabetes.  Last HbA1c is 13.4.  He reports having cold in the feet for the past year and feels as if his hands feel are asleep.  He also has some weakness in the hands and feet. Imbalance started about a month ago.  He has been using a walker for the past several weeks.  He gets episodic sharp pain in the legs which was exacerbated by gabapentin, so stopped it.  He feels that his balance significantly got worse after being hospitalized with COVID.  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  Exam continues to show muscle weakness and atrophy in the hands > legs.  Ataxia has improved and he is now ambulating with a cane.  He diabetes is under excellent control.                - Start occupational therapy for hand weakness  - Resources for hand assist devices offered  - Fall precautions discussed     2.  Thiamine and folic acid deficiency neuropathy               - Normal levels, now transitioned to multivitamin    Patient Stated Goals Want to see if I can get more strength in my hands and ability to perform tasks with more ease    Currently in Pain? No/denies                [facility] OT Assessment - 05/10/21 0001       AROM   Right Wrist Extension 70 Degrees    Right Wrist Flexion 90 Degrees    Left Wrist Extension 70 Degrees    Left Wrist Flexion 90 Degrees      Strength  Right Hand Grip (lbs) 60    Right Hand Lateral Pinch 12 lbs    Right Hand 3 Point Pinch 9 lbs    Left Hand Grip (lbs) 65    Left Hand Lateral Pinch 11 lbs    Left Hand 3 Point Pinch 7 lbs                   AROM for R  and L wrist  WNL- strength in all planes for wrist 5/5 As well as grip and prehension assess- grip  and prehension about same - see flow sheet             Cont with table slides 20 reps  AAROM for PA and RA of thumb  and cont  rubber band to them - 12 reps-3rd set 1 x day Opposition to pick up small objects alternate digits ( focus on 5th ) 10 reps Lumbrical fist - place and hold 5th digit 10 reps ADD of digits  focus on 5th  Putty   firm  dark blue  gripping  Into ball 12 reps- and  lat grip  And cont with  green med firm  for 3 point pinch  2 x 12 -15 reps  Sem[place] Weinstein sensation test - normal sensation 2.83 on all digits  this date including 5th   Pt report able to use hands functionally in most everything now - small objects at work too                         OT Education - 05/10/21 1354     Education Details progress and changes to [employer]) Educated Patient    Methods Explanation;Demonstration;Tactile cues;Verbal cues;Handout    Comprehension Verbal cues required;Returned demonstration;Verbalized understanding                 OT [place]sten Term Goals - 05/10/21 1358       OT [place] TERM GOAL #1   Title Pt to be independent in HEP to increase AROM and strength in bilateral hands to be ble to perform ADLs' with more ease    Status Achieved      OT [place] TERM GOAL #2   Title Pt to verbalize 3 modifications and or AE to use to increase ease of performing ADL's and IADL's    Status Achieved      OT [place] TERM GOAL #3   Title 3 point pinch increase with 2 lbs to open packages and do buttons with more ease    Status Achieved      OT [place] TERM GOAL #4   Title Pt demo indendence in HEP to maintain strength in grip and prehension - increaes opposition and ADD of 5th    Baseline unable to do ADD of 5th -and Froments with lat grip    Time 8    Period Weeks    Status New    Target Date 07/05/21                   Plan - 05/10/21 1355     Clinical Impression Statement Pt refer for bilateral UE diabetic neuropathy with worsening weakness and decrease use of bilateral hands in ADL's and IADL's - 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 NOW in 4 sessions  pt made great progress wrist AROM and strength in all planes WNL -  grip  and prehension strength increased  but cont to show flexion of 5th digits during opposition -  Sensation improve to WNL On Semmes Weinstein sensation this date show normal sensation in all  digits.  Cont to be limited by ADD of 5th and Froments sign on bilateral hands with lat grip - pt to cont with HEP for  2 months. Pt ed on changes to HEP and nerve healing and stretches. Pt to follow up in 2 months if needed    OT Occupational Profile and History Problem Focused Assessment - Including review of records relating to presenting problem    Occupational performance deficits (Please refer to evaluation for details): ADL's;IADL's;Work;Leisure;Play;Social Participation    Body Structure / Function / Physical Skills ADL;Dexterity;Decreased knowledge of use of DME;Strength;IADL;ROM;UE functional use;FMC;Decreased knowledge of precautions;Flexibility;Sensation    Rehab Potential Fair    Clinical Decision Making Limited treatment options, no task modification necessary    Comorbidities Affecting Occupational Performance: None    Modification or Assistance to Complete Evaluation  No modification of tasks or assist necessary to complete eval    OT Frequency --   2 months   OT Treatment/Interventions Self-care/ADL training;Manual Therapy;Patient/family education;Therapeutic exercise;Contrast Bath;DME and/or AE instruction    Consulted and Agree with Plan of Care Patient             Patient will benefit from skilled therapeutic intervention in order to improve the following deficits and impairments:   Body Structure / Function / Physical Skills: ADL, Dexterity, Decreased knowledge of use of DME, Strength, IADL, ROM, UE functional use, FMC, Decreased knowledge of precautions, Flexibility, Sensation       Visit Diagnosis: Other disturbances of skin sensation - Plan: Ot plan of care cert/re-cert  Muscle weakness (generalized) - Plan: Ot plan of care cert/re-cert    Problem List Patient Active Problem List   Diagnosis Date Noted   Type 2 diabetes mellitus with diabetic neuropathy, unspecified (HCC) 04/16/2020   Cataracts, bilateral 04/16/2020   Sinus tachycardia 01/14/2020    Insomnia 01/07/2020   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 2 diabetes mellitus (HCC) 07/27/2017   Generalized anxiety disorder 07/27/2017   Chronic right flank pain 12/20/2015   Hyperkalemia 10/21/2014   Chest pain 09/21/2014   ADHD 06/22/2010   ERECTILE DYSFUNCTION, 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 [place], [address] Phone: [phone]   Fax:  [phone]  Name: [name] MRN: [id number] Date of Birth: 1955-10-23

## 2021-05-20 ENCOUNTER — Encounter: Payer: Self-pay | Admitting: Family Medicine

## 2021-05-20 ENCOUNTER — Ambulatory Visit: Payer: No Typology Code available for payment source | Admitting: Family Medicine

## 2021-05-20 VITALS — BP 128/84 | HR 89 | Temp 98.4°F | Wt 214.0 lb

## 2021-05-20 DIAGNOSIS — Z794 Long term (current) use of insulin: Secondary | ICD-10-CM

## 2021-05-20 DIAGNOSIS — I10 Essential (primary) hypertension: Secondary | ICD-10-CM

## 2021-05-20 DIAGNOSIS — E1140 Type 2 diabetes mellitus with diabetic neuropathy, unspecified: Secondary | ICD-10-CM | POA: Diagnosis not present

## 2021-05-20 MED ORDER — INSULIN PEN NEEDLE 29G X 12.7MM MISC: 3 refills | Status: DC

## 2021-05-20 MED ORDER — NOVOLIN 70/30 FLEXPEN (70-30) 100 UNIT/ML ~~LOC~~ SUPN: 15.0000 [IU] | PEN_INJECTOR | Freq: Two times a day (BID) | SUBCUTANEOUS | 3 refills | Status: DC

## 2021-05-20 MED ORDER — ACCU-CHEK GUIDE VI STRP: ORAL_STRIP | 3 refills | Status: DC

## 2021-05-20 MED ORDER — ALPRAZOLAM 0.5 MG PO TABS
0.5000 mg | ORAL_TABLET | Freq: Three times a day (TID) | ORAL | 1 refills | Status: DC | PRN
Start: 2021-05-20 — End: 2022-05-03

## 2021-05-20 MED ORDER — FUROSEMIDE 20 MG PO TABS: 20.0000 mg | ORAL_TABLET | Freq: Every day | ORAL | 3 refills | Status: DC | PRN

## 2021-05-20 NOTE — Progress Notes (Signed)
   Subjective:    Patient ID: [name], male    DOB: 17-Jan-1956, 65 y.o.   MRN: [id number]  HPI Here to follow up on diabetes. He feels good except the ankle swelling has become a bit uncomfortable. No SOB. This goes down overnight. His A1c today is 6.2 %. His OT is progressing well, and he has regained some of the sensation in his fingers and hands. BP is stable.    Review of Systems  Constitutional: Negative.   Respiratory: Negative.    Cardiovascular: Negative.   Neurological:  Positive for weakness and numbness. Negative for dizziness and light-headedness.      Objective:   Physical Exam Constitutional:      Appearance: Normal appearance.     Comments: Walks with a cane   Cardiovascular:     Rate and Rhythm: Normal rate and regular rhythm.     Pulses: Normal pulses.     Heart sounds: Normal heart sounds.  Pulmonary:     Effort: Pulmonary effort is normal.     Breath sounds: Normal breath sounds.  Neurological:     Mental Status: He is alert.          Assessment & Plan:  He is progressing well with his rehab for diabetic neuropathy. His diabetes is well controlled. The HTN is stable. For the ankle edema he will try lasix 20 mg on an as needed basis.  [name], MD

## 2021-05-24 ENCOUNTER — Encounter: Payer: Self-pay | Admitting: Family Medicine

## 2021-05-24 NOTE — Telephone Encounter (Signed)
Yes they should have given him 3 boxes. By my math, each pen has 3 ml, so one box has a total of 15 ml. Therefor when I wrote for 45 ml, this would equal 3 boxes. He should talk to the pharmacy about this

## 2021-06-03 ENCOUNTER — Ambulatory Visit: Payer: No Typology Code available for payment source | Admitting: Neurology

## 2021-08-08 ENCOUNTER — Other Ambulatory Visit: Payer: Self-pay

## 2021-08-08 ENCOUNTER — Encounter: Payer: No Typology Code available for payment source | Attending: Physician Assistant | Admitting: Physician Assistant

## 2021-08-08 DIAGNOSIS — E11621 Type 2 diabetes mellitus with foot ulcer: Secondary | ICD-10-CM | POA: Insufficient documentation

## 2021-08-08 DIAGNOSIS — L97422 Non-pressure chronic ulcer of left heel and midfoot with fat layer exposed: Secondary | ICD-10-CM | POA: Diagnosis present

## 2021-08-08 DIAGNOSIS — I10 Essential (primary) hypertension: Secondary | ICD-10-CM | POA: Insufficient documentation

## 2021-08-08 DIAGNOSIS — I872 Venous insufficiency (chronic) (peripheral): Secondary | ICD-10-CM | POA: Diagnosis not present

## 2021-08-08 DIAGNOSIS — E1151 Type 2 diabetes mellitus with diabetic peripheral angiopathy without gangrene: Secondary | ICD-10-CM | POA: Insufficient documentation

## 2021-08-08 NOTE — Progress Notes (Signed)
[name] ([id number]) Visit Report for 08/08/2021 Chief Complaint Document Details Patient Name: [name], [name] Date of Service: 08/08/2021 12:45 PM Medical Record Number: [id number] Patient Account Number: [id number] Date of Birth/Sex: 1956/03/16 (66 y.o. M) Treating RN: Levora Dredge Primary Care Provider: Alysia Penna Other Clinician: Referring Provider: Referral, Self Treating Provider/Extender: Skipper Cliche in Treatment: 0 Information Obtained from: Patient Chief Complaint Left heel ulcer Electronic Signature(s) Signed: 08/08/2021 1:25:51 PM By: Worthy Keeler PA-C Previous Signature: 08/08/2021 1:25:39 PM Version By: Worthy Keeler PA-C Entered By: Worthy Keeler on 08/08/2021 13:25:51 [name], [name] ([id number]) -------------------------------------------------------------------------------- Debridement Details Patient Name: [name], [name] Date of Service: 08/08/2021 12:45 PM Medical Record Number: [id number] Patient Account Number: [id number] Date of Birth/Sex: 20-Dec-1955 (66 y.o. M) Treating RN: Levora Dredge Primary Care Provider: Alysia Penna Other Clinician: Referring Provider: Referral, Self Treating Provider/Extender: Skipper Cliche in Treatment: 0 Debridement Performed for Wound #1 Left Calcaneus Assessment: Performed By: Physician Tommie Sams., PA-C Debridement Type: Debridement Severity of Tissue Pre Debridement: Fat layer exposed Level of Consciousness (Pre- Awake and Alert procedure): Pre-procedure Verification/Time Out Yes - 13:28 Taken: Total Area Debrided (L x W): 1.7 (cm) x 1.7 (cm) = 2.89 (cm) Tissue and other material Non-Viable, Callus, Skin: Dermis , Skin: Epidermis debrided: Level: Skin/Epidermis Debridement Description: Selective/Open Wound Instrument: Curette Bleeding: None Response to Treatment: Procedure was tolerated well Level of Consciousness (Post- Awake and Alert procedure): Post Debridement Measurements of Total  Wound Length: (cm) 1.7 Width: (cm) 1.7 Depth: (cm) 0.1 Volume: (cm) 0.227 Character of Wound/Ulcer Post Debridement: Stable Severity of Tissue Post Debridement: Fat layer exposed Post Procedure Diagnosis Same as Pre-procedure Electronic Signature(s) Signed: 08/08/2021 4:31:00 PM By: Levora Dredge Signed: 08/08/2021 5:14:22 PM By: Worthy Keeler PA-C Entered By: Levora Dredge on 08/08/2021 13:30:50 [name], [name] ([id number]) -------------------------------------------------------------------------------- HPI Details Patient Name: [name], [name] Date of Service: 08/08/2021 12:45 PM Medical Record Number: [id number] Patient Account Number: [id number] Date of Birth/Sex: 11-01-1955 (66 y.o. M) Treating RN: Levora Dredge Primary Care Provider: Alysia Penna Other Clinician: Referring Provider: Referral, Self Treating Provider/Extender: Skipper Cliche in Treatment: 0 History of Present Illness HPI Description: 08/08/2021 upon evaluation today patient appears to be doing somewhat poorly in regard to a heel ulcer that began as a result of the patient tells me him trying to get his foot into his shoes that were somewhat tight. This was around 4 January. He was using a shoehorn fairly aggressively and thinks that he may have caused the bruising at that time. Fortunately there does not appear to be any signs of infection at this point. Nonetheless he does have some definite necrotic tissue. This can need to be cleared away. Nonetheless unfortunately [name] we do not have clearance in my opinion therefore to go forward with an aggressive sharp debridement. Patient does have diabetes mellitus type 2 with the most recent hemoglobin A1c of 5.2 and that was on June 2022. He also has a history of chronic venous insufficiency, peripheral vascular disease, and hypertension. Obviously with regard to the peripheral vascular disease we will continue to send him to vascular to  quantify his ABIs and TBI's. Electronic Signature(s) Signed: 08/08/2021 2:21:07 PM By: Worthy Keeler PA-C Entered By: Worthy Keeler on 08/08/2021 14:21:06 [name], [name] ([id number]) -------------------------------------------------------------------------------- Physical Exam Details Patient Name: [name], [name] Date of Service: 08/08/2021 12:45 PM Medical Record Number: [id number] Patient Account Number: [id number] Date of Birth/Sex: 27-Oct-1955 (65 y.o.  M) Treating RN: Levora Dredge Primary Care Provider: Alysia Penna Other Clinician: Referring Provider: Referral, Self Treating Provider/Extender: Skipper Cliche in Treatment: 0 Constitutional patient is hypertensive.. pulse regular and within target range for patient.Marland Kitchen respirations regular, non-labored and within target range for patient.Marland Kitchen temperature within target range for patient.. Well-nourished and well-hydrated in no acute distress. Eyes conjunctiva clear no eyelid edema noted. pupils equal round and reactive to light and accommodation. Ears, Nose, Mouth, and Throat no gross abnormality of ear auricles or external auditory canals. normal hearing noted during conversation. mucus membranes moist. Respiratory normal breathing without difficulty. clear to auscultation bilaterally. Cardiovascular 2+ dorsalis pedis/posterior tibialis pulses. no clubbing, cyanosis, significant edema, <3 sec cap refill. Musculoskeletal normal gait and posture. no significant deformity or arthritic changes, no loss or range of motion, no clubbing. Psychiatric this patient is able to make decisions and demonstrates good insight into disease process. Alert and Oriented x 3. pleasant and cooperative. Notes Upon inspection patient's wound bed actually showed signs of some necrotic tissue around the edges of the wound I did perform a very careful debridement to remove just the necrotic tissue around the edge no bleeding was noted during this debridement.  This was a superficial only debridement. Subsequently the goal was to get all the dead tissue away so we can try to start cleaning up the surface of this wound with Iodoflex for the time being and then once were able to get good arterial flow noted then we can perform sharp debridement to clear this up more effectively and quickly. Electronic Signature(s) Signed: 08/08/2021 2:22:17 PM By: Worthy Keeler PA-C Entered By: Worthy Keeler on 08/08/2021 14:22:17 Lacomb, Lionel W. ([id number]) -------------------------------------------------------------------------------- Physician Orders Details Patient Name: [name], [name] Date of Service: 08/08/2021 12:45 PM Medical Record Number: [id number] Patient Account Number: [id number] Date of Birth/Sex: 1956/04/21 (66 y.o. M) Treating RN: Levora Dredge Primary Care Provider: Alysia Penna Other Clinician: Referring Provider: Referral, Self Treating Provider/Extender: Skipper Cliche in Treatment: 0 Verbal / Phone Orders: No Diagnosis Coding ICD-10 Coding Code Description E11.621 Type 2 diabetes mellitus with foot ulcer L97.422 Non-pressure chronic ulcer of left heel and midfoot with fat layer exposed I87.2 Venous insufficiency (chronic) (peripheral) I73.89 Other specified peripheral vascular diseases I10 Essential (primary) hypertension Follow-up Appointments o Return Appointment in 1 week. o Nurse Visit as needed [employer] o Wash wounds with antibacterial soap and water. o May shower; gently cleanse wound with antibacterial soap, rinse and pat dry prior to dressing wounds - shower with dressing on and remove after shower o No tub bath. Edema Control - Lymphedema / Segmental Compressive Device / Other o Elevate, Exercise Daily and Avoid Standing for [place] Periods of Time. o Elevate leg(s) parallel to the floor when sitting. o DO YOUR BEST to sleep in the bed at night. DO NOT sleep in your recliner. Jeudy hours of  sitting in a recliner leads to swelling of the legs and/or potential wounds on your backside. Wound Treatment Wound #1 - Calcaneus Wound Laterality: Left Cleanser: Byram Ancillary Kit - 15 Day Supply (DME) (Generic) 3 x Per Week/30 Days Discharge Instructions: Use supplies as instructed; Kit contains: (15) Saline Bullets; (15) 3x3 Gauze; 15 pr Gloves Primary Dressing: IODOFLEX 0.9% Cadexomer Iodine Pad (DME) (Generic) 3 x Per Week/30 Days Discharge Instructions: Apply Iodoflex to wound bed only as directed. Secondary Dressing: Zetuvit Plus Silicone Border Dressing 5x5 (in/in) (DME) (Generic) 3 x Per Week/30 Days Services and Therapies o Toe pressures (TBI) -  left lower extremity o Ankle Brachial Index (ABI) - left lower extremity Electronic Signature(s) Signed: 08/08/2021 4:31:00 PM By: Levora Dredge Signed: 08/08/2021 5:14:22 PM By: Worthy Keeler PA-C Entered By: Levora Dredge on 08/08/2021 13:47:51 [name], [name] ([id number]) -------------------------------------------------------------------------------- Problem List Details Patient Name: [name], [name] Date of Service: 08/08/2021 12:45 PM Medical Record Number: [id number] Patient Account Number: [id number] Date of Birth/Sex: 1955/07/25 (66 y.o. M) Treating RN: Levora Dredge Primary Care Provider: Alysia Penna Other Clinician: Referring Provider: Referral, Self Treating Provider/Extender: Skipper Cliche in Treatment: 0 Active Problems ICD-10 Encounter Code Description Active Date MDM Diagnosis E11.621 Type 2 diabetes mellitus with foot ulcer 08/08/2021 No Yes L97.422 Non-pressure chronic ulcer of left heel and midfoot with fat layer 08/08/2021 No Yes exposed I87.2 Venous insufficiency (chronic) (peripheral) 08/08/2021 No Yes I73.89 Other specified peripheral vascular diseases 08/08/2021 No Yes I10 Essential (primary) hypertension 08/08/2021 No Yes Inactive Problems Resolved Problems Electronic Signature(s) Signed:  08/08/2021 1:25:22 PM By: Worthy Keeler PA-C Entered By: Worthy Keeler on 08/08/2021 13:25:22 [name], [name] -------------------------------------------------------------------------------- Progress Note Details Patient Name: [name], [name] Date of Service: 08/08/2021 12:45 PM Medical Record Number: [id number] Patient Account Number: [id number] Date of Birth/Sex: May 21, 1956 (66 y.o. M) Treating RN: Levora Dredge Primary Care Provider: Alysia Penna Other Clinician: Referring Provider: Referral, Self Treating Provider/Extender: Skipper Cliche in Treatment: 0 Subjective Chief Complaint Information obtained from Patient Left heel ulcer History of Present Illness (HPI) 08/08/2021 upon evaluation today patient appears to be doing somewhat poorly in regard to a heel ulcer that began as a result of the patient tells me him trying to get his foot into his shoes that were somewhat tight. This was around 4 January. He was using a shoehorn fairly aggressively and thinks that he may have caused the bruising at that time. Fortunately there does not appear to be any signs of infection at this point. Nonetheless he does have some definite necrotic tissue. This can need to be cleared away. Nonetheless unfortunately [name] we do not have clearance in my opinion therefore to go forward with an aggressive sharp debridement. Patient does have diabetes mellitus type 2 with the most recent hemoglobin A1c of 5.2 and that was on June 2022. He also has a history of chronic venous insufficiency, peripheral vascular disease, and hypertension. Obviously with regard to the peripheral vascular disease we will continue to send him to vascular to quantify his ABIs and TBI's. Patient History Information obtained from Chart. Allergies No Known Drug Allergies Family History Diabetes - Siblings. Social History Former smoker - 30 years ago, Marital Status - Married, Alcohol Use -  Never, Drug Use - No History, Caffeine Use - Daily - coffee. Medical History Eyes Patient has history of Cataracts - removed bilaterally Cardiovascular Patient has history of Hypertension Endocrine Patient has history of Type II Diabetes Neurologic Patient has history of Neuropathy Patient is treated with Insulin. Blood sugar is tested. Blood sugar results noted at the following times: Breakfast - 107. Medical And Surgical History Notes Constitutional Symptoms (General Health) Morbid Obesity, ADHA, Chest Pain, Hyperkalemia, Chronic right flank pain, General anxiety disorder, Insomnia, Sinus Tachaycardia Genitourinary Hx of Kidney Stones Musculoskeletal arthritis Review of Systems (ROS) Eyes Complains or has symptoms of Glasses / Contacts. Ear/Nose/Mouth/Throat Denies complaints or symptoms of Difficult clearing ears, Sinusitis. Hematologic/Lymphatic Denies complaints or symptoms of Bleeding / Clotting Disorders, Human Immunodeficiency Virus. Respiratory Denies complaints or symptoms of Chronic or frequent coughs, Shortness of Breath.  Gastrointestinal Denies complaints or symptoms of Frequent diarrhea, Nausea, Vomiting. Immunological Denies complaints or symptoms of Hives, Itching. Integumentary (Skin) Complains or has symptoms of Wounds. Psychiatric [name], [name] ([id number]) Denies complaints or symptoms of Anxiety, Claustrophobia. Objective Constitutional patient is hypertensive.. pulse regular and within target range for patient.Marland Kitchen respirations regular, non-labored and within target range for patient.Marland Kitchen temperature within target range for patient.. Well-nourished and well-hydrated in no acute distress. Vitals Time Taken: 12:37 PM, Height: 68 in, Source: Stated, Weight: 220 lbs, Source: Stated, BMI: 33.4, Temperature: 98.2 F, Pulse: 87 bpm, Respiratory Rate: 18 breaths/min, Blood Pressure: 165/84 mmHg. General Notes: PA Stone made aware Eyes conjunctiva clear no eyelid  edema noted. pupils equal round and reactive to light and accommodation. Ears, Nose, Mouth, and Throat no gross abnormality of ear auricles or external auditory canals. normal hearing noted during conversation. mucus membranes moist. Respiratory normal breathing without difficulty. clear to auscultation bilaterally. Cardiovascular 2+ dorsalis pedis/posterior tibialis pulses. no clubbing, cyanosis, significant edema, Musculoskeletal normal gait and posture. no significant deformity or arthritic changes, no loss or range of motion, no clubbing. Psychiatric this patient is able to make decisions and demonstrates good insight into disease process. Alert and Oriented x 3. pleasant and cooperative. General Notes: Upon inspection patient's wound bed actually showed signs of some necrotic tissue around the edges of the wound I did perform a very careful debridement to remove just the necrotic tissue around the edge no bleeding was noted during this debridement. This was a superficial only debridement. Subsequently the goal was to get all the dead tissue away so we can try to start cleaning up the surface of this wound with Iodoflex for the time being and then once were able to get good arterial flow noted then we can perform sharp debridement to clear this up more effectively and quickly. Integumentary (Hair, Skin) Wound #1 status is Open. Original cause of wound was Pressure Injury. The date acquired was: 06/19/2021. The wound is located on the Left Calcaneus. The wound measures 1.7cm length x 1.7cm width x 0.1cm depth; 2.27cm^2 area and 0.227cm^3 volume. There is Fat Layer (Subcutaneous Tissue) exposed. There is undermining starting at 4:00 and ending at 7:00 with a maximum distance of 0.3cm. There is a medium amount of serosanguineous drainage noted. There is small (1-33%) pale granulation within the wound bed. There is a large (67-100%) amount of necrotic tissue within the wound bed including Adherent  Slough. Assessment Active Problems ICD-10 Type 2 diabetes mellitus with foot ulcer Non-pressure chronic ulcer of left heel and midfoot with fat layer exposed Venous insufficiency (chronic) (peripheral) Other specified peripheral vascular diseases Essential (primary) hypertension Procedures Wound #1 Pre-procedure diagnosis of Wound #1 is a Diabetic Wound/Ulcer of the Lower Extremity located on the Left Calcaneus .Severity of Tissue Pre Quezada, Dajohn W. ([id number]) Debridement is: Fat layer exposed. There was a Selective/Open Wound Skin/Epidermis Debridement with a total area of 2.89 sq cm performed by Tommie Sams., PA-C. With the following instrument(s): Curette to remove Non-Viable tissue/material. Material removed includes Callus, Skin: Dermis, and Skin: Epidermis. A time out was conducted at 13:28, prior to the start of the procedure. There was no bleeding. The procedure was tolerated well. Post Debridement Measurements: 1.7cm length x 1.7cm width x 0.1cm depth; 0.227cm^3 volume. Character of Wound/Ulcer Post Debridement is stable. Severity of Tissue Post Debridement is: Fat layer exposed. Post procedure Diagnosis Wound #1: Same as Pre-Procedure Plan Follow-up Appointments: Return Appointment in 1 week. Nurse Visit as needed Bathing/  Shower/ Hygiene: Wash wounds with antibacterial soap and water. May shower; gently cleanse wound with antibacterial soap, rinse and pat dry prior to dressing wounds - shower with dressing on and remove after shower No tub bath. Edema Control - Lymphedema / Segmental Compressive Device / Other: Elevate, Exercise Daily and Avoid Standing for [place] Periods of Time. Elevate leg(s) parallel to the floor when sitting. DO YOUR BEST to sleep in the bed at night. DO NOT sleep in your recliner. [place] hours of sitting in a recliner leads to swelling of the legs and/or potential wounds on your backside. Services and Therapies ordered were: Toe pressures (TBI) -  left lower extremity, Ankle Brachial Index (ABI) - left lower extremity WOUND #1: - Calcaneus Wound Laterality: Left Cleanser: Byram Ancillary Kit - 15 Day Supply (DME) (Generic) 3 x Per Week/30 Days Discharge Instructions: Use supplies as instructed; Kit contains: (15) Saline Bullets; (15) 3x3 Gauze; 15 pr Gloves Primary Dressing: IODOFLEX 0.9% Cadexomer Iodine Pad (DME) (Generic) 3 x Per Week/30 Days Discharge Instructions: Apply Iodoflex to wound bed only as directed. Secondary Dressing: Zetuvit Plus Silicone Border Dressing 5x5 (in/in) (DME) (Generic) 3 x Per Week/30 Days 1. Would recommend currently that we go ahead and continue with the recommendation for Iodoflex which I think will do a good to start cleaning up the surface of this wound. 2. I am also can recommend that we have the patient continue with the Zetuvit border foam dressing to cover which I think is good to be the best way to go currently. 3. I would also suggest the patient continue to monitor for any signs of worsening or infection I did give him information for new shoes to get so that he does not put any pressure on the heel. I think this is going to be of utmost importance ongoing and [place]-term. That was discussed with the patient today as well. We will see patient back for reevaluation in 1 week here in the clinic. If anything worsens or changes patient will contact our office for additional recommendations. Electronic Signature(s) Signed: 08/08/2021 2:24:05 PM By: Worthy Keeler PA-C Entered By: Worthy Keeler on 08/08/2021 14:24:04 Terpstra, Paxson W. ([id number]) -------------------------------------------------------------------------------- ROS/PFSH Details Patient Name: Starry, Nocholas W. Date of Service: 08/08/2021 12:45 PM Medical Record Number: [id number] Patient Account Number: [id number] Date of Birth/Sex: 23-Feb-1956 (66 y.o. M) Treating RN: Cornell Barman Primary Care Provider: Alysia Penna Other Clinician: Referring  Provider: Referral, Self Treating Provider/Extender: Skipper Cliche in Treatment: 0 Information Obtained From Chart Eyes Complaints and Symptoms: Positive for: Glasses / Contacts Medical History: Positive for: Cataracts - removed bilaterally Ear/Nose/Mouth/Throat Complaints and Symptoms: Negative for: Difficult clearing ears; Sinusitis Hematologic/Lymphatic Complaints and Symptoms: Negative for: Bleeding / Clotting Disorders; Human Immunodeficiency Virus Respiratory Complaints and Symptoms: Negative for: Chronic or frequent coughs; Shortness of Breath Gastrointestinal Complaints and Symptoms: Negative for: Frequent diarrhea; Nausea; Vomiting Immunological Complaints and Symptoms: Negative for: Hives; Itching Integumentary (Skin) Complaints and Symptoms: Positive for: Wounds Psychiatric Complaints and Symptoms: Negative for: Anxiety; Claustrophobia Constitutional Symptoms (General Health) Medical History: Past Medical History Notes: Morbid Obesity, ADHA, Chest Pain, Hyperkalemia, Chronic right flank pain, General anxiety disorder, Insomnia, Sinus Tachaycardia Cardiovascular Medical History: Positive for: Hypertension Endocrine Medders, Keyshun W. ([id number]) Medical History: Positive for: Type II Diabetes Time with diabetes: 20 plus years Treated with: Insulin Blood sugar tested every day: Yes Tested : once daily Blood sugar testing results: Breakfast: 107 Genitourinary Medical History: Past Medical History Notes: Hx of Kidney Stones Musculoskeletal  Medical History: Past Medical History Notes: arthritis Neurologic Medical History: Positive for: Neuropathy Oncologic HBO Extended History Items Eyes: Cataracts Immunizations Pneumococcal Vaccine: Received Pneumococcal Vaccination: No Tetanus Vaccine: Last tetanus shot: 05/10/2015 Implantable Devices None Family and Social History Diabetes: Yes - Siblings; Former smoker - 30 years ago; Marital Status -  Married; Alcohol Use: Never; Drug Use: No History; Caffeine Use: Daily - coffee Electronic Signature(s) Signed: 08/08/2021 4:31:00 PM By: Levora Dredge Signed: 08/08/2021 5:11:02 PM By: Gretta Cool BSN, RN, CWS, Kim RN, BSN Signed: 08/08/2021 5:14:22 PM By: Worthy Keeler PA-C Entered By: Levora Dredge on 08/08/2021 12:43:55 Monterrosa, Alper W. ([id number]) -------------------------------------------------------------------------------- SuperBill Details Patient Name: Hands, Canio W. Date of Service: 08/08/2021 Medical Record Number: [id number] Patient Account Number: [id number] Date of Birth/Sex: [birth year] (66 y.o. M) Treating RN: Levora Dredge Primary Care Provider: Alysia Penna Other Clinician: Referring Provider: Referral, Self Treating Provider/Extender: Skipper Cliche in Treatment: 0 Diagnosis Coding ICD-10 Codes Code Description E11.621 Type 2 diabetes mellitus with foot ulcer L97.422 Non-pressure chronic ulcer of left heel and midfoot with fat layer exposed I87.2 Venous insufficiency (chronic) (peripheral) I73.89 Other specified peripheral vascular diseases I10 Essential (primary) hypertension Facility Procedures CPT4 Code: 27078675 Description: 99213 - WOUND CARE VISIT-LEV 3 EST PT Modifier: Quantity: 1 CPT4 Code: 44920100 Description: 97597 - DEBRIDE WOUND 1ST 20 SQ CM OR < Modifier: Quantity: 1 CPT4 Code: Description: ICD-10 Diagnosis Description L97.422 Non-pressure chronic ulcer of left heel and midfoot with fat layer expos Modifier: ed Quantity: Physician Procedures CPT4 Code: 7121975 Description: 88325 - WC PHYS LEVEL 4 - NEW PT Modifier: 25 Quantity: 1 CPT4 Code: Description: ICD-10 Diagnosis Description E11.621 Type 2 diabetes mellitus with foot ulcer L97.422 Non-pressure chronic ulcer of left heel and midfoot with fat layer expos I87.2 Venous insufficiency (chronic) (peripheral) I73.89 Other specified  peripheral vascular diseases Modifier:  ed Quantity: CPT4 Code: [id number] Description: 97597 - WC PHYS DEBR WO ANESTH 20 SQ CM Modifier: Quantity: 1 CPT4 Code: Description: ICD-10 Diagnosis Description L97.422 Non-pressure chronic ulcer of left heel and midfoot with fat layer expos Modifier: ed Quantity: Electronic Signature(s) Signed: 08/08/2021 2:24:24 PM By: Worthy Keeler PA-C Entered By: Worthy Keeler on 08/08/2021 14:24:23

## 2021-08-08 NOTE — Progress Notes (Addendum)
Hayner, Graeson Viona Gilmore ([id number]) Visit Report for 08/08/2021 Allergy List Details Patient Name: Dondlinger, Talik W. Date of Service: 08/08/2021 12:45 PM Medical Record Number: [id number] Patient Account Number: [id number] Date of Birth/Sex: 12-31-1955 (66 y.o. M) Treating RN: Cornell Barman Primary Care Zendaya Groseclose: Alysia Penna Other Clinician: Referring Georgina Krist: Referral, Self Treating Zoya Sprecher/Extender: Skipper Cliche in Treatment: 0 Allergies Active Allergies No Known Drug Allergies Allergy Notes Electronic Signature(s) Signed: 08/08/2021 12:10:27 PM By: Gretta Cool, BSN, RN, CWS, Kim RN, BSN Entered By: Gretta Cool, BSN, RN, CWS, Kim on 08/08/2021 12:10:27 Leclere, Morgen W. ([id number]) -------------------------------------------------------------------------------- Arrival Information Details Patient Name: Souders, Macallan W. Date of Service: 08/08/2021 12:45 PM Medical Record Number: [id number] Patient Account Number: [id number] Date of Birth/Sex: [birth year] (66 y.o. M) Treating RN: Levora Dredge Primary Care Jerome Otter: Alysia Penna Other Clinician: Referring Chioma Mukherjee: Referral, Self Treating Kanon Colunga/Extender: Skipper Cliche in Treatment: 0 Visit Information Patient Arrived: Cane Arrival Time: 12:36 Accompanied By: wife Transfer Assistance: None Patient Identification Verified: Yes Secondary Verification Process Completed: Yes Electronic Signature(s) Signed: 08/08/2021 4:31:00 PM By: Levora Dredge Entered By: Levora Dredge on 08/08/2021 12:37:19 Stann, Omarrion W. ([id number]) -------------------------------------------------------------------------------- Clinic Level of Care Assessment Details Patient Name: Clodfelter, Parker W. Date of Service: 08/08/2021 12:45 PM Medical Record Number: [id number] Patient Account Number: [id number] Date of Birth/Sex: 1956/04/02 (66 y.o. M) Treating RN: Levora Dredge Primary Care Kenden Brandt: Alysia Penna Other Clinician: Referring Leila Schuff: Referral, Self Treating  Kamau Weatherall/Extender: Skipper Cliche in Treatment: 0 Clinic Level of Care Assessment Items TOOL 1 Quantity Score []  - Use when EandM and Procedure is performed on INITIAL visit 0 ASSESSMENTS - Nursing Assessment / Reassessment []  - General Physical Exam (combine w/ comprehensive assessment (listed just below) when performed on new 0 pt. evals) X- 1 25 Comprehensive Assessment (HX, ROS, Risk Assessments, Wounds Hx, etc.) ASSESSMENTS - Wound and Skin Assessment / Reassessment []  - Dermatologic / Skin Assessment (not related to wound area) 0 ASSESSMENTS - Ostomy and/or Continence Assessment and Care []  - Incontinence Assessment and Management 0 []  - 0 Ostomy Care Assessment and Management (repouching, etc.) PROCESS - Coordination of Care X - Simple Patient / Family Education for ongoing care 1 15 []  - 0 Complex (extensive) Patient / Family Education for ongoing care X- 1 10 Staff obtains Programmer, systems, Records, Test Results / Process Orders []  - 0 Staff telephones HHA, Nursing Homes / Clarify orders / etc []  - 0 Routine Transfer to another Facility (non-emergent condition) []  - 0 Routine Hospital Admission (non-emergent condition) X- 1 15 New Admissions / Biomedical engineer / Ordering NPWT, Apligraf, etc. []  - 0 Emergency Hospital Admission (emergent condition) PROCESS - Special Needs []  - Pediatric / Minor Patient Management 0 []  - 0 Isolation Patient Management []  - 0 Hearing / Language / Visual special needs []  - 0 Assessment of Community assistance (transportation, D/C planning, etc.) []  - 0 Additional assistance / Altered mentation []  - 0 Support Surface(s) Assessment (bed, cushion, seat, etc.) INTERVENTIONS - Miscellaneous []  - External ear exam 0 []  - 0 Patient Transfer (multiple staff / Civil Service fast streamer / Similar devices) []  - 0 Simple Staple / Suture removal (25 or less) []  - 0 Complex Staple / Suture removal (26 or more) []  - 0 Hypo/Hyperglycemic Management (do  not check if billed separately) X- 1 15 Ankle / Brachial Index (ABI) - do not check if billed separately Has the patient been seen at the hospital within the last three years: Yes Total Score: 80 Level Of Care: New/Established - Level 3  Hilmes, Zaine W. ([id number]) Electronic Signature(s) Signed: 08/08/2021 4:31:00 PM By: Levora Dredge Entered By: Levora Dredge on 08/08/2021 13:48:47 Hebenstreit, Con W. ([id number]) -------------------------------------------------------------------------------- Encounter Discharge Information Details Patient Name: Ashe, Clarance W. Date of Service: 08/08/2021 12:45 PM Medical Record Number: [id number] Patient Account Number: [id number] Date of Birth/Sex: 1955/10/28 (66 y.o. M) Treating RN: Levora Dredge Primary Care Ashara Lounsbury: Alysia Penna Other Clinician: Referring Truth Wolaver: Referral, Self Treating Myrka Sylva/Extender: Skipper Cliche in Treatment: 0 Encounter Discharge Information Items Post Procedure Vitals Discharge Condition: Stable Temperature (F): 98.2 Ambulatory Status: Cane Pulse (bpm): 87 Discharge Destination: Home Respiratory Rate (breaths/min): 18 Transportation: Private Auto Blood Pressure (mmHg): 165/84 Accompanied By: wife Schedule Follow-up Appointment: Yes Clinical Summary of Care: Electronic Signature(s) Signed: 08/08/2021 4:31:00 PM By: Levora Dredge Entered By: Levora Dredge on 08/08/2021 13:50:41 Blumenstock, Haruo W. ([id number]) -------------------------------------------------------------------------------- Lower Extremity Assessment Details Patient Name: Bruning, Miley W. Date of Service: 08/08/2021 12:45 PM Medical Record Number: [id number] Patient Account Number: [id number] Date of Birth/Sex: 09/22/1955 (66 y.o. M) Treating RN: Levora Dredge Primary Care Jameek Bruntz: Alysia Penna Other Clinician: Referring Torryn Hudspeth: Referral, Self Treating Jahmeir Geisen/Extender: Skipper Cliche in Treatment: 0 Edema Assessment Assessed: [Left:  No] [Right: No] Edema: [Left: Ye] [Right: s] Calf Left: Right: Point of Measurement: 33 cm From Medial Instep 36.8 cm Ankle Left: Right: Point of Measurement: 11 cm From Medial Instep 25.5 cm Vascular Assessment Pulses: Dorsalis Pedis Palpable: [Left:Yes] Posterior Tibial Palpable: [Left:Yes] Notes left lower extremity ABI attempted, Non compressible Electronic Signature(s) Signed: 08/08/2021 4:31:00 PM By: Levora Dredge Entered By: Levora Dredge on 08/08/2021 12:58:45 Munch, Rylan W. ([id number]) -------------------------------------------------------------------------------- Multi Wound Chart Details Patient Name: Tahir, Ainsley W. Date of Service: 08/08/2021 12:45 PM Medical Record Number: [id number] Patient Account Number: [id number] Date of Birth/Sex: Jul 28, 1955 (66 y.o. M) Treating RN: Levora Dredge Primary Care Naidelin Gugliotta: Alysia Penna Other Clinician: Referring Mahima Hottle: Referral, Self Treating Dravin Lance/Extender: Skipper Cliche in Treatment: 0 Vital Signs Height(in): 68 Pulse(bpm): 87 Weight(lbs): 220 Blood Pressure(mmHg): 165/84 Body Mass Index(BMI): 33.4 Temperature(F): 98.2 Respiratory Rate(breaths/min): 18 Photos: [N/A:N/A] Wound Location: Left Calcaneus N/A N/A Wounding Event: Pressure Injury N/A N/A Primary Etiology: Diabetic Wound/Ulcer of the Lower N/A N/A Extremity Comorbid History: Cataracts, Hypertension, Type II N/A N/A Diabetes, Neuropathy Date Acquired: 06/19/2021 N/A N/A Weeks of Treatment: 0 N/A N/A Wound Status: Open N/A N/A Wound Recurrence: No N/A N/A Measurements L x W x D (cm) 1.7x1.7x0.1 N/A N/A Area (cm) : 2.27 N/A N/A Volume (cm) : 0.227 N/A N/A Starting Position 1 (o'clock): 4 Ending Position 1 (o'clock): 7 Maximum Distance 1 (cm): 0.3 Undermining: Yes N/A N/A Classification: Grade 2 N/A N/A Exudate Amount: Medium N/A N/A Exudate Type: Serosanguineous N/A N/A Exudate Color: red, brown N/A N/A Granulation Amount: Small  (1-33%) N/A N/A Granulation Quality: Pale N/A N/A Necrotic Amount: Large (67-100%) N/A N/A Exposed Structures: Fat Layer (Subcutaneous Tissue): N/A N/A Yes Treatment Notes Electronic Signature(s) Signed: 08/08/2021 4:31:00 PM By: Levora Dredge Entered By: Levora Dredge on 08/08/2021 13:04:21 Mceuen, Amil W. ([id number]) -------------------------------------------------------------------------------- Multi-Disciplinary Care Plan Details Patient Name: Pultz, Lael W. Date of Service: 08/08/2021 12:45 PM Medical Record Number: [id number] Patient Account Number: [id number] Date of Birth/Sex: 1955-10-18 (66 y.o. M) Treating RN: Levora Dredge Primary Care Kristianna Saperstein: Alysia Penna Other Clinician: Referring Chevez Sambrano: Referral, Self Treating Larell Baney/Extender: Skipper Cliche in Treatment: 0 Active Inactive Orientation to the Wound Care Program Nursing Diagnoses: Knowledge deficit related to the wound healing center program Goals: Patient/caregiver will verbalize understanding of the [facility] Date Initiated:  08/08/2021 Target Resolution Date: 08/08/2021 Goal Status: Active Interventions: Provide education on orientation to the wound center Notes: Wound/Skin Impairment Nursing Diagnoses: Impaired tissue integrity Knowledge deficit related to ulceration/compromised skin integrity Goals: Ulcer/skin breakdown will have a volume reduction of 30% by week 4 Date Initiated: 08/08/2021 Target Resolution Date: 09/05/2021 Goal Status: Active Ulcer/skin breakdown will have a volume reduction of 50% by week 8 Date Initiated: 08/08/2021 Target Resolution Date: 10/03/2021 Goal Status: Active Ulcer/skin breakdown will have a volume reduction of 80% by week 12 Date Initiated: 08/08/2021 Target Resolution Date: 10/31/2021 Goal Status: Active Ulcer/skin breakdown will heal within 14 weeks Date Initiated: 08/08/2021 Target Resolution Date: 11/14/2021 Goal Status:  Active Interventions: Assess patient/caregiver ability to obtain necessary supplies Assess patient/caregiver ability to perform ulcer/skin care regimen upon admission and as needed Assess ulceration(s) every visit Provide education on ulcer and skin care Notes: Electronic Signature(s) Signed: 08/08/2021 4:31:00 PM By: Levora Dredge Entered By: Levora Dredge on 08/08/2021 13:04:05 Balke, Novak W. ([id number]) -------------------------------------------------------------------------------- Pain Assessment Details Patient Name: Gundry, Sina W. Date of Service: 08/08/2021 12:45 PM Medical Record Number: [id number] Patient Account Number: [id number] Date of Birth/Sex: 1955-12-28 (66 y.o. M) Treating RN: Levora Dredge Primary Care Marquisha Nikolov: Alysia Penna Other Clinician: Referring Ithzel Fedorchak: Referral, Self Treating Latarshia Jersey/Extender: Skipper Cliche in Treatment: 0 Active Problems Location of Pain Severity and Description of Pain Patient Has Paino No Site Locations Rate the pain. Current Pain Level: 0 Pain Management and Medication Current Pain Management: Electronic Signature(s) Signed: 08/08/2021 4:31:00 PM By: Levora Dredge Entered By: Levora Dredge on 08/08/2021 12:37:35 Singleton, Alekai W. ([id number]) -------------------------------------------------------------------------------- Patient/Caregiver Education Details Patient Name: Buschman, Socrates W. Date of Service: 08/08/2021 12:45 PM Medical Record Number: [id number] Patient Account Number: [id number] Date of Birth/Gender: [birth year] (66 y.o. M) Treating RN: Levora Dredge Primary Care Physician: Alysia Penna Other Clinician: Referring Physician: Referral, Self Treating Physician/Extender: Skipper Cliche in Treatment: 0 Education Assessment Education Provided To: Patient Education Topics Provided Welcome To [facility]: Handouts: Welcome To [facility] Methods: Explain/Verbal Responses: State content  correctly Wound/Skin Impairment: Handouts: Caring for Your Ulcer Methods: Explain/Verbal Responses: State content correctly Electronic Signature(s) Signed: 08/08/2021 4:31:00 PM By: Levora Dredge Entered By: Levora Dredge on 08/08/2021 13:49:40 Hochmuth, Jonn W. ([id number]) -------------------------------------------------------------------------------- Wound Assessment Details Patient Name: Spikes, Jaaziah W. Date of Service: 08/08/2021 12:45 PM Medical Record Number: [id number] Patient Account Number: [id number] Date of Birth/Sex: [birth year] (66 y.o. M) Treating RN: Levora Dredge Primary Care Camrie Stock: Alysia Penna Other Clinician: Referring Deontray Hunnicutt: Referral, Self Treating Sheela Mcculley/Extender: Skipper Cliche in Treatment: 0 Wound Status Wound Number: 1 Primary Etiology: Diabetic Wound/Ulcer of the Lower Extremity Wound Location: Left Calcaneus Wound Status: Open Wounding Event: Pressure Injury Comorbid Cataracts, Hypertension, Type II Diabetes, History: Neuropathy Date Acquired: 06/19/2021 Weeks Of Treatment: 0 Clustered Wound: No Photos Wound Measurements Length: (cm) 1.7 Width: (cm) 1.7 Depth: (cm) 0.1 Area: (cm) 2.27 Volume: (cm) 0.227 % Reduction in Area: % Reduction in Volume: Undermining: Yes Starting Position (o'clock): 4 Ending Position (o'clock): 7 Maximum Distance: (cm) 0.3 Wound Description Classification: Grade 2 Exudate Amount: Medium Exudate Type: Serosanguineous Exudate Color: red, brown Foul Odor After Cleansing: No Slough/Fibrino Yes Wound Bed Granulation Amount: Small (1-33%) Exposed Structure Granulation Quality: Pale Fat Layer (Subcutaneous Tissue) Exposed: Yes Necrotic Amount: Large (67-100%) Necrotic Quality: Adherent Slough Treatment Notes Wound #1 (Calcaneus) Wound Laterality: Left Cleanser [place]ram Ancillary Kit - 15 Day Supply Discharge Instruction: Use supplies as instructed; Kit contains: (15) Saline Bullets; (15) 3x3 Gauze;  15 pr  Gloves Peri-Wound Care Topical Flight, Crisanto W. ([id number]) Primary Dressing IODOFLEX 0.9% Cadexomer Iodine Pad Discharge Instruction: Apply Iodoflex to wound bed only as directed. Secondary Dressing Zetuvit Plus Silicone Border Dressing 5x5 (in/in) Secured With Compression Wrap Compression Stockings Add-Ons Electronic Signature(s) Signed: 08/08/2021 4:31:00 PM By: Levora Dredge Entered By: Levora Dredge on 08/08/2021 13:02:21 Mach, Jacqueline W. ([id number]) -------------------------------------------------------------------------------- Vitals Details Patient Name: Farra, Helder W. Date of Service: 08/08/2021 12:45 PM Medical Record Number: [id number] Patient Account Number: [id number] Date of Birth/Sex: December 11, 1955 (66 y.o. M) Treating RN: Levora Dredge Primary Care Sanford Lindblad: Alysia Penna Other Clinician: Referring Remon Quinto: Referral, Self Treating Madeline Bebout/Extender: Skipper Cliche in Treatment: 0 Vital Signs Time Taken: 12:37 Temperature (F): 98.2 Height (in): 68 Pulse (bpm): 87 Source: Stated Respiratory Rate (breaths/min): 18 Weight (lbs): 220 Blood Pressure (mmHg): 165/84 Source: Stated Reference Range: 80 - 120 mg / dl Body Mass Index (BMI): 33.4 Notes PA Stone made aware Electronic Signature(s) Signed: 08/08/2021 4:31:00 PM By: Levora Dredge Entered By: Levora Dredge on 08/08/2021 13:31:07

## 2021-08-08 NOTE — Progress Notes (Signed)
Mcelreath, Evian Lacretia Nicks ([id number]) Visit Report for 08/08/2021 Abuse Risk Screen Details Patient Name: [name], [name] Date of Service: 08/08/2021 12:45 PM Medical Record Number: [id number] Patient Account Number: [id number] Date of Birth/Sex: 1956/01/14 (66 y.o. M) Treating RN: Angelina Pih Primary Care Seraya Jobst: [name] Other Clinician: Referring Lin Glazier: Referral, Self Treating Humbert [name]/Extender: Rowan Blase in Treatment: 0 Abuse Risk Screen Items Answer ABUSE RISK SCREEN: Has anyone close to you tried to hurt or harm you recentlyo No Do you feel uncomfortable with anyone in your familyo No Has anyone forced you do things that you didnot want to doo No Electronic Signature(s) Signed: 08/08/2021 4:31:00 PM By: Angelina Pih Entered By: Angelina Pih on 08/08/2021 12:44:13 [name], [name] ([id number]) -------------------------------------------------------------------------------- Activities of Daily Living Details Patient Name: [name], [name] Date of Service: 08/08/2021 12:45 PM Medical Record Number: [id number] Patient Account Number: [id number] Date of Birth/Sex: [birth year] (66 y.o. M) Treating RN: Angelina Pih Primary Care Delvina Mizzell: [name] Other Clinician: Referring Brelee Renk: Referral, Self Treating Treson Laura/Extender: Rowan Blase in Treatment: 0 Activities of Daily Living Items Answer Activities of Daily Living (Please select one for each item) Drive Automobile Completely Able Take Medications Completely Able Use Telephone Completely Able Care for Appearance Completely Able Use Toilet Completely Able Bath / Shower Completely Able Dress Self Completely Able Feed Self Completely Able Walk Completely Able Get In / Out Bed Completely Able Housework Completely Able Prepare Meals Completely Able Handle Money Completely Able Shop for Self Completely Able Electronic Signature(s) Signed: 08/08/2021 4:31:00 PM By: Angelina Pih Entered By: Angelina Pih  on 08/08/2021 12:44:43 [name], [name] ([id number]) -------------------------------------------------------------------------------- Education Screening Details Patient Name: [name], [name] Date of Service: 08/08/2021 12:45 PM Medical Record Number: [id number] Patient Account Number: [id number] Date of Birth/Sex: 02-10-1956 (66 y.o. M) Treating RN: Angelina Pih Primary Care Amit Meloy: [name] Other Clinician: Referring Eamonn Sermeno: Referral, Self Treating Alizay Bronkema/Extender: Rowan Blase in Treatment: 0 Learning Preferences/Education Level/Primary Language Learning Preference: Explanation, Demonstration, Video, Communication Board, Printed Material Highest Education Level: College or Above Preferred Language: English Cognitive Barrier Language Barrier: No Translator Needed: No Memory Deficit: No Emotional Barrier: No Cultural/Religious Beliefs Affecting Medical Care: No Physical Barrier Impaired Vision: Yes Glasses Impaired Hearing: No Decreased Hand dexterity: No Knowledge/Comprehension Knowledge Level: High Comprehension Level: High Ability to understand written instructions: High Ability to understand verbal instructions: High Motivation Anxiety Level: Calm Cooperation: Cooperative Education Importance: Acknowledges Need Interest in Health Problems: Asks Questions Perception: Coherent Willingness to Engage in Self-Management High Activities: Readiness to Engage in Self-Management High Activities: Electronic Signature(s) Signed: 08/08/2021 4:31:00 PM By: Angelina Pih Entered By: Angelina Pih on 08/08/2021 12:45:09 [name], [name] ([id number]) -------------------------------------------------------------------------------- Fall Risk Assessment Details Patient Name: [name], [name] Date of Service: 08/08/2021 12:45 PM Medical Record Number: [id number] Patient Account Number: [id number] Date of Birth/Sex: 05/26/1956 (66 y.o. M) Treating RN: Angelina Pih Primary Care Eldo Umanzor: [name] Other Clinician: Referring Haig Gerardo: Referral, Self Treating Taite Schoeppner/Extender: Rowan Blase in Treatment: 0 Fall Risk Assessment Items Have you had 2 or more falls in the last 12 monthso 0 No Have you had any fall that resulted in injury in the last 12 monthso 0 No FALLS RISK SCREEN History of falling - immediate or within 3 months 0 No Secondary diagnosis (Do you have 2 or more medical diagnoseso) 0 No Ambulatory aid None/bed rest/wheelchair/nurse 0 No Crutches/cane/walker 15 Yes Furniture 0 No Intravenous therapy Access/Saline/Heparin Lock 0 No Gait/Transferring Normal/ bed rest/ wheelchair 0 Yes Weak (short steps with  or without shuffle, stooped but able to lift head while walking, may 0 No seek support from furniture) Impaired (short steps with shuffle, may have difficulty arising from chair, head down, impaired 0 No balance) Mental Status Oriented to own ability 0 Yes Electronic Signature(s) Signed: 08/08/2021 4:31:00 PM By: Angelina Pih Entered By: Angelina Pih on 08/08/2021 12:45:24 [name], [name] ([id number]) -------------------------------------------------------------------------------- Foot Assessment Details Patient Name: [name], [name] Date of Service: 08/08/2021 12:45 PM Medical Record Number: [id number] Patient Account Number: [id number] Date of Birth/Sex: October 09, 1955 (66 y.o. M) Treating RN: Angelina Pih Primary Care Lino Wickliff: [name] Other Clinician: Referring Aron Needles: Referral, Self Treating Willard Farquharson/Extender: Rowan Blase in Treatment: 0 Foot Assessment Items Site Locations + = Sensation present, - = Sensation absent, C = Callus, U = Ulcer R = Redness, W = Warmth, M = Maceration, PU = Pre-ulcerative lesion F = Fissure, S = Swelling, D = Dryness Assessment Right: Left: Other Deformity: No No Prior Foot Ulcer: No No Prior Amputation: No No Charcot Joint: No No Ambulatory Status:  Ambulatory With Help Assistance Device: Cane Gait: Steady Electronic Signature(s) Signed: 08/08/2021 4:31:00 PM By: Angelina Pih Entered By: Angelina Pih on 08/08/2021 12:48:37 [name], [name] ([id number]) -------------------------------------------------------------------------------- Nutrition Risk Screening Details Patient Name: [name], [name] Date of Service: 08/08/2021 12:45 PM Medical Record Number: [id number] Patient Account Number: [id number] Date of Birth/Sex: [birth year] (66 y.o. M) Treating RN: Angelina Pih Primary Care Zameria Vogl: [name] Other Clinician: Referring Romeo Zielinski: Referral, Self Treating Nakyia Dau/Extender: Rowan Blase in Treatment: 0 Height (in): 68 Weight (lbs): 220 Body Mass Index (BMI): 33.4 Nutrition Risk Screening Items Score Screening NUTRITION RISK SCREEN: I have an illness or condition that made me change the kind and/or amount of food I eat 0 No I eat fewer than two meals per day 0 No I eat few fruits and vegetables, or milk products 0 No I have three or more drinks of beer, liquor or wine almost every day 0 No I have tooth or mouth problems that make it hard for me to eat 0 No I don't always have enough money to buy the food I need 0 No I eat alone most of the time 0 No I take three or more different prescribed or over-the-counter drugs a day 0 No Without wanting to, I have lost or gained 10 pounds in the last six months 0 No I am not always physically able to shop, cook and/or feed myself 0 No Nutrition Protocols Good Risk Protocol Provide education on elevated Moderate Risk Protocol 0 blood sugars and impact on wound healing, as applicable High Risk Proctocol Risk Level: Good Risk Score: 0 Electronic Signature(s) Signed: 08/08/2021 4:31:00 PM By: Angelina Pih Entered By: Angelina Pih on 08/08/2021 13:02:46

## 2021-08-09 ENCOUNTER — Other Ambulatory Visit (INDEPENDENT_AMBULATORY_CARE_PROVIDER_SITE_OTHER): Payer: Self-pay | Admitting: Physician Assistant

## 2021-08-09 DIAGNOSIS — L97422 Non-pressure chronic ulcer of left heel and midfoot with fat layer exposed: Secondary | ICD-10-CM

## 2021-08-09 DIAGNOSIS — L97509 Non-pressure chronic ulcer of other part of unspecified foot with unspecified severity: Secondary | ICD-10-CM

## 2021-08-09 DIAGNOSIS — E13621 Other specified diabetes mellitus with foot ulcer: Secondary | ICD-10-CM

## 2021-08-15 ENCOUNTER — Ambulatory Visit: Payer: No Typology Code available for payment source | Admitting: Physician Assistant

## 2021-08-15 ENCOUNTER — Other Ambulatory Visit: Payer: Self-pay | Admitting: Family Medicine

## 2021-08-16 NOTE — Telephone Encounter (Signed)
Last OV- 05/20/2021 Last refill-02/17/2021--30 tabs, 5 refills  Next OV- 08/23/2021

## 2021-08-17 ENCOUNTER — Ambulatory Visit (INDEPENDENT_AMBULATORY_CARE_PROVIDER_SITE_OTHER): Payer: No Typology Code available for payment source

## 2021-08-17 ENCOUNTER — Other Ambulatory Visit: Payer: Self-pay

## 2021-08-17 ENCOUNTER — Encounter: Payer: No Typology Code available for payment source | Admitting: Family Medicine

## 2021-08-17 DIAGNOSIS — L97422 Non-pressure chronic ulcer of left heel and midfoot with fat layer exposed: Secondary | ICD-10-CM | POA: Diagnosis not present

## 2021-08-17 DIAGNOSIS — L97509 Non-pressure chronic ulcer of other part of unspecified foot with unspecified severity: Secondary | ICD-10-CM | POA: Diagnosis not present

## 2021-08-17 DIAGNOSIS — E13621 Other specified diabetes mellitus with foot ulcer: Secondary | ICD-10-CM | POA: Diagnosis not present

## 2021-08-19 ENCOUNTER — Other Ambulatory Visit: Payer: Self-pay

## 2021-08-19 ENCOUNTER — Ambulatory Visit: Payer: No Typology Code available for payment source | Admitting: Neurology

## 2021-08-19 ENCOUNTER — Encounter: Payer: Self-pay | Admitting: Neurology

## 2021-08-19 VITALS — BP 131/75 | HR 71 | Ht 68.0 in | Wt 209.0 lb

## 2021-08-19 DIAGNOSIS — E1142 Type 2 diabetes mellitus with diabetic polyneuropathy: Secondary | ICD-10-CM | POA: Diagnosis not present

## 2021-08-19 NOTE — Progress Notes (Signed)
? ? ?Follow-up Visit ? ? ?Date: 08/19/21 ? ? ?[name] ?MRN: [id number] ?DOB: Jun 15, 1956 ? ? ?Interim History: ?[name] is a 66 y.o. right-handed Caucasian male with diabetes mellitus, hypertension, and nutrient deficiency returning to the clinic for follow-up of neurpathy.  The patient was accompanied to the clinic by wife who also provides collateral information.   ? ?History of present illness: ?He has been diabetic for the past 20 years.  He ran out of medications for about a year which lead to worsening diabetes with HbA1c is 13.4.  He reports having cold in the feet for the past year and feels as if his hands feel are asleep.  He also has some weakness in the hands and feet. Imbalance started about a month ago.  He has been using a walker for the past several weeks.  He gets episodic sharp pain in the legs which was exacerbated by gabapentin, so stopped it.  He feels that his balance significantly got worse after being hospitalized with COVID. ?He is not vaccinated.  ? ?UPDATE 03/31/2020:  He is here for EDX testing and discuss results.  Since being compliant with medication, his HbA1c has dramatically improved from 13.4 >> 6.0.  He is also trying to improve his diet, but wife feels that he may not be eating enough.  Of note, his labs showed both thiamine and folate deficiency.  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  He is on short-term disability and may consider [place]-term disability, if neuropathy does not improve.   He also recently was diagnosed with cataracts and may need surgery because vision is blurred.  ? ?UPDATE 08/02/2020:   There has been no progressive weakness or numbness/tingling, but he continues to have baseline weakness and numbness in the hands and feet. His balance and leg strength has improved with PT. He uses a walker and has not had any falls.  He also underwent cataract surgery and returned to work.  He struggles with typing  because he cannot tell where the keys are and the pressure applied.  He is planning on appealing his STD. ? ?UPDATE 11/29/2020:  For the last month, he has been using a cane and no longer a walker.  He has not suffered any falls.  There has been no improvement in hand weakness.  He struggles with holding utensils and other fine motor tasks.  He completed PT.  No new complaints. ? ?UPDATE 08/19/2021:  He is here for follow-up visit.  He completed out-patient OT and felt that it helped some with his hand strength.  He still has difficulty opening jars/bottles.  He rarely drops items.  He is better with typing.  He has been working full-time since February 2022 and is planning to retire April 1st. He is able to sense water on his feet and also sensitivity in the hands is better.  ?No falls, he walks with a cane, but often will walk unassisted around the home. ?He is being seen by vascular surgery and wound care for left heel ulcer.  ?Excellent control of diabetes with last HbA1c 5.2. ? ?Medications:  ?Current Outpatient Medications on File Prior to Visit  ?Medication Sig Dispense Refill  ? temazepam (RESTORIL) 30 MG capsule TAKE 1 CAPSULE BY MOUTH AT BEDTIME AS NEEDED FOR SLEEP 30 capsule 5  ? ACCU-CHEK GUIDE test strip Use to test blood sugars twice daily. 200 strip 3  ? Accu-Chek Softclix  Lancets lancets daily.    ? ALPRAZolam (XANAX) 0.5 MG tablet Take 1 tablet (0.5 mg total) by mouth 3 (three) times daily as needed for anxiety or sleep. 270 tablet 1  ? Blood Glucose Monitoring Suppl (ACCU-CHEK GUIDE ME) w/Device KIT USE AS DIRECTED EVERY DAY    ? furosemide (LASIX) 20 MG tablet Take 1 tablet (20 mg total) by mouth daily as needed for fluid. 90 tablet 3  ? insulin isophane & regular human KwikPen (NOVOLIN 70/30 KWIKPEN) (70-30) 100 UNIT/ML KwikPen Inject 15 Units into the skin 2 (two) times daily with a meal. 45 mL 3  ? Insulin Pen Needle 29G X 12.7MM MISC Use as directed to inject novilin 200 each 3  ? levothyroxine  (SYNTHROID) 50 MCG tablet Take 1 tablet (50 mcg total) by mouth daily. 90 tablet 3  ? metoprolol succinate (TOPROL-XL) 25 MG 24 hr tablet TAKE 1 TABLET BY MOUTH EVERY DAY 90 tablet 1  ? Multiple Vitamin (MULTIVITAMIN) tablet Take 1 tablet by mouth daily.    ? ?No current facility-administered medications on file prior to visit.  ? ? ?Allergies: No Known Allergies ? ? ?Vitals:  ? 08/19/21 0809  ?BP: 131/75  ?Pulse: 71  ?Height: _0  (1.727 m)  ?Weight: 209 lb (94.8 kg)  ?SpO2: 100%  ?BMI (Calculated): 31.79  ? ? ?Neurological Exam: ?MENTAL STATUS including orientation to time, place, person, recent and remote memory, attention span and concentration, language, and fund of knowledge is normal.  Speech is not dysarthric. ? ?CRANIAL NERVES:  Normal conjugate, extra-ocular eye movements in all directions of gaze.  No ptosis. Normal facial sensation.  ? ?MOTOR:  Moderate-to-severe bilateral hand atrophy, No fasciculations or abnormal movements.  No pronator drift.  ? ?Upper Extremity:  Right  Left  ?Deltoid  5/5   5/5   ?Biceps  5/5   5/5   ?Triceps  5/5   5/5   ?Infraspinatus 5/5  5/5  ?Medial pectoralis 5/5  5/5  ?Wrist extensors  5/5   5/5   ?Wrist flexors  5/5   5/5   ?Finger extensors  5/5   5/5   ?Finger flexors  4/5   4/5   ?Dorsal interossei  4/5   4/5   ?Abductor pollicis  5/5   5/5   ?Tone (Ashworth scale)  0  0  ? ?Lower Extremity:  Right  Left  ?Hip flexors  5/5   5/5   ?Hip extensors  5/5   5/5   ?Adductor 5/5  5/5  ?Abductor 5/5  5/5  ?Knee flexors  5/5   5/5   ?Knee extensors  5/5   5/5   ?Dorsiflexors  5-/5   5-/5   ?Plantarflexors  5/5   5/5   ?Toe extensors  4/5   4/5   ?Tone (Ashworth scale)  0  0  ? ? ?MSRs:  ?                                         Right        Left ?brachioradialis 2+  2+  ?biceps 2+  2+  ?triceps 2+  2+  ?patellar 1+  1+  ?ankle jerk 0  0  ? ?SENSORY:  Vibration trace at the ankles, intact above the knees and hands.  ? ?COORDINATION/GAIT:  Gait is wide-based with mild bilateral  foot drop/high steppage, mild unsteadiness,  better with a cane. ? ? ?Data: ?NCS/EMG of the left side 03/31/2020: The electrophysiologic findings are consistent with a severe polyradiculoneuropathy affecting the left side. ? ?Lab Results  ?Component Value Date  ? HGBA1C 5.2 12/08/2020  ? ?Last vitamin B12 and Folate ?Lab Results  ?Component Value Date  ? KHTXHFSF42 >1506 (H) 08/09/2020  ? FOLATE >23.6 08/09/2020  ? ? ? 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  Exam with muscle weakness and atrophy in the hands >> legs.  Ataxia has improved, now ambulating with a cane.  Diabetes has been under excellent control since 2021.  No significant painful paresthesias. ? - Continue home OT and PT exercises ? - Management of left heel ulcer by wound care ? - Continue to use a cane ? - Patient educated on daily foot inspection, fall prevention, and safety precautions around the home. ? ?Thiamine and folic acid deficiency neuropathy ?- Levels have normalized  ?- Continue multivitamin ? ?Return to clinic in 1 year ? ? ?Thank you for allowing me to participate in patient's care.  If I can answer any additional questions, I would be pleased to do so.   ? ?Sincerely, ? ? ? ?Dionne Knoop K. Posey Pronto, DO ? ? ?

## 2021-08-19 NOTE — Patient Instructions (Signed)
Best wishes with your retirement ? ?Return to clinic in 1 year ?

## 2021-08-22 ENCOUNTER — Encounter: Payer: No Typology Code available for payment source | Attending: Physician Assistant | Admitting: Physician Assistant

## 2021-08-22 ENCOUNTER — Other Ambulatory Visit: Payer: Self-pay

## 2021-08-22 DIAGNOSIS — E1151 Type 2 diabetes mellitus with diabetic peripheral angiopathy without gangrene: Secondary | ICD-10-CM | POA: Insufficient documentation

## 2021-08-22 DIAGNOSIS — E11621 Type 2 diabetes mellitus with foot ulcer: Secondary | ICD-10-CM | POA: Diagnosis present

## 2021-08-22 DIAGNOSIS — I872 Venous insufficiency (chronic) (peripheral): Secondary | ICD-10-CM | POA: Diagnosis not present

## 2021-08-22 DIAGNOSIS — L97422 Non-pressure chronic ulcer of left heel and midfoot with fat layer exposed: Secondary | ICD-10-CM | POA: Insufficient documentation

## 2021-08-22 DIAGNOSIS — I10 Essential (primary) hypertension: Secondary | ICD-10-CM | POA: Diagnosis not present

## 2021-08-22 NOTE — Progress Notes (Signed)
Ales, Trevaris Viona Gilmore ([id number]) Visit Report for 08/22/2021 Arrival Information Details Patient Name: [name], [name]. Date of Service: 08/22/2021 2:15 PM Medical Record Number: [id number] Patient Account Number: [id number] Date of Birth/Sex: [birth year] (66 y.o. M) Treating RN: Levora Dredge Primary Care Gabreal Worton: Alysia Penna Other Clinician: Referring Arel Tippen: Alysia Penna Treating Arelene Moroni/Extender: Skipper Cliche in Treatment: 2 Visit Information History Since Last Visit Added or deleted any medications: No Patient Arrived: [name] Any new allergies or adverse reactions: No Arrival Time: 13:59 Had a fall or experienced change in No Accompanied By: self activities of daily living that may affect Transfer Assistance: None risk of falls: Patient Identification Verified: Yes Hospitalized since last visit: No Secondary Verification Process Completed: Yes Has Dressing in Place as Prescribed: Yes Patient Has Alerts: Yes Pain Present Now: No Patient Alerts: ABI R >1.0 Powell TBI 0.50 ABI L 0.98 Electronic Signature(s) Signed: 08/22/2021 3:20:36 PM By: Levora Dredge Entered By: Levora Dredge on 08/22/2021 14:25:47 Debois, Jonmichael [name]. ([id number]) -------------------------------------------------------------------------------- Clinic Level of Care Assessment Details Patient Name: [name], [name]. Date of Service: 08/22/2021 2:15 PM Medical Record Number: [id number] Patient Account Number: [id number] Date of Birth/Sex: Apr 23, 1956 (66 y.o. M) Treating RN: Levora Dredge Primary Care Jordynn Marcella: Alysia Penna Other Clinician: Referring Janes Colegrove: Alysia Penna Treating Vonetta Foulk/Extender: Skipper Cliche in Treatment: 2 Clinic Level of Care Assessment Items TOOL 1 Quantity Score _0  - Use when EandM and Procedure is performed on INITIAL visit 0 ASSESSMENTS - Nursing Assessment / Reassessment _1  - General Physical Exam (combine [name]/ comprehensive assessment (listed just below) when performed on new 0 pt.  evals) _2  - 0 Comprehensive Assessment (HX, ROS, Risk Assessments, Wounds Hx, etc.) ASSESSMENTS - Wound and Skin Assessment / Reassessment _3  - Dermatologic / Skin Assessment (not related to wound area) 0 ASSESSMENTS - Ostomy and/or Continence Assessment and Care _4  - Incontinence Assessment and Management 0 _5  - 0 Ostomy Care Assessment and Management (repouching, etc.) PROCESS - Coordination of Care _6  - Simple Patient / Family Education for ongoing care 0 _7  - 0 Complex (extensive) Patient / Family Education for ongoing care _8  - 0 Staff obtains Programmer, systems, Records, Test Results / Process Orders _9  - 0 Staff telephones HHA, Nursing Homes / Clarify orders / etc _10  - 0 Routine Transfer to another Facility (non-emergent condition) _11  - 0 Routine Hospital Admission (non-emergent condition) _12  - 0 New Admissions / Biomedical engineer / Ordering NPWT, Apligraf, etc. _13  - 0 Emergency Hospital Admission (emergent condition) PROCESS - Special Needs _14  - Pediatric / Minor Patient Management 0 _15  - 0 Isolation Patient Management _16  - 0 Hearing / Language / Visual special needs _17  - 0 Assessment of Community assistance (transportation, D/C planning, etc.) _18  - 0 Additional assistance / Altered mentation _19  - 0 Support Surface(s) Assessment (bed, cushion, seat, etc.) INTERVENTIONS - Miscellaneous _20  - External ear exam 0 _21  - 0 Patient Transfer (multiple staff / Civil Service fast streamer / Similar devices) _22  - 0 Simple Staple / Suture removal (25 or less) _23  - 0 Complex Staple / Suture removal (26 or more) _24  - 0 Hypo/Hyperglycemic Management (do not check if billed separately) _25  - 0 Ankle / Brachial Index (ABI) - do not check if billed separately Has the patient been seen at the hospital within the last three years: Yes Total Score: 0 Level Of Care: ____ [name] ([id number]) Electronic Signature(s) Signed: 08/22/2021 3:20:36 PM By: Levora Dredge Entered By: Levora Dredge  on 08/22/2021 14:38:32 Monter, Marbin [name]. ([id number]) -------------------------------------------------------------------------------- Encounter Discharge Information  Details Patient Name: [name], [name]. Date of Service: 08/22/2021 2:15 PM Medical Record Number: [id number] Patient Account Number: [id number] Date of Birth/Sex: 12-08-1955 (66 y.o. M) Treating RN: Levora Dredge Primary Care Aleira Deiter: Alysia Penna Other Clinician: Referring Yosselyn Tax: Alysia Penna Treating Amberlin Utke/Extender: Skipper Cliche in Treatment: 2 Encounter Discharge Information Items Post Procedure Vitals Discharge Condition: Stable Temperature (F): 97.9 Ambulatory Status: Cane Pulse (bpm): 84 Discharge Destination: Home Respiratory Rate (breaths/min): 18 Transportation: Private Auto Blood Pressure (mmHg): 167/91 Accompanied By: wife Schedule Follow-up Appointment: No Clinical Summary of Care: Electronic Signature(s) Signed: 08/22/2021 3:20:36 PM By: Levora Dredge Entered By: Levora Dredge on 08/22/2021 14:40:11 Clenney, Cederic [name]. ([id number]) -------------------------------------------------------------------------------- Lower Extremity Assessment Details Patient Name: [name], [name]. Date of Service: 08/22/2021 2:15 PM Medical Record Number: [id number] Patient Account Number: [id number] Date of Birth/Sex: [birth year] (66 y.o. M) Treating RN: Levora Dredge Primary Care Aashrith Eves: Alysia Penna Other Clinician: Referring Helmut Hennon: Alysia Penna Treating Aryonna Gunnerson/Extender: Jeri Cos Weeks in Treatment: 2 Edema Assessment Assessed: [Left: No] [Right: No] Edema: [Left: Ye] [Right: s] Calf Left: Right: Point of Measurement: 33 cm From Medial Instep 36 cm Ankle Left: Right: Point of Measurement: 11 cm From Medial Instep 25.7 cm Vascular Assessment Pulses: Dorsalis Pedis Palpable: [Left:Yes] Electronic Signature(s) Signed: 08/22/2021 3:20:36 PM By: Levora Dredge Entered By: Levora Dredge on 08/22/2021  14:12:34 Calarco, Izick [name]. ([id number]) -------------------------------------------------------------------------------- Multi Wound Chart Details Patient Name: [name], [name]. Date of Service: 08/22/2021 2:15 PM Medical Record Number: [id number] Patient Account Number: [id number] Date of Birth/Sex: [birth year] (66 y.o. M) Treating RN: Levora Dredge Primary Care Mandrell Vangilder: Alysia Penna Other Clinician: Referring Alek Borges: Alysia Penna Treating Cam Harnden/Extender: Skipper Cliche in Treatment: 2 Vital Signs Height(in): 68 Pulse(bpm): 84 Weight(lbs): 220 Blood Pressure(mmHg): 167/91 Body Mass Index(BMI): 33.4 Temperature(F): 97.9 Respiratory Rate(breaths/min): 18 Photos: [N/A:N/A] Wound Location: Left Calcaneus N/A N/A Wounding Event: Pressure Injury N/A N/A Primary Etiology: Diabetic Wound/Ulcer of the Lower N/A N/A Extremity Comorbid History: Cataracts, Hypertension, Type II N/A N/A Diabetes, Neuropathy Date Acquired: 06/19/2021 N/A N/A Weeks of Treatment: 2 N/A N/A Wound Status: Open N/A N/A Wound Recurrence: No N/A N/A Measurements L x [name] x D (cm) 2.8x1.5x0.1 N/A N/A Area (cm) : 3.299 N/A N/A Volume (cm) : 0.33 N/A N/A % Reduction in Area: -45.30% N/A N/A % Reduction in Volume: -45.40% N/A N/A Classification: Grade 2 N/A N/A Exudate Amount: Medium N/A N/A Exudate Type: Serosanguineous N/A N/A Exudate Color: red, brown N/A N/A Granulation Amount: Small (1-33%) N/A N/A Granulation Quality: Pink, Pale N/A N/A Necrotic Amount: Large (67-100%) N/A N/A Exposed Structures: Fat Layer (Subcutaneous Tissue): N/A N/A Yes Epithelialization: Small (1-33%) N/A N/A Treatment Notes Electronic Signature(s) Signed: 08/22/2021 3:20:36 PM By: Levora Dredge Entered By: Levora Dredge on 08/22/2021 14:18:53 Manigo, Morey [name]. ([id number]) -------------------------------------------------------------------------------- Multi-Disciplinary Care Plan Details Patient Name: [name], [name]. Date of  Service: 08/22/2021 2:15 PM Medical Record Number: [id number] Patient Account Number: [id number] Date of Birth/Sex: 08/10/1955 (66 y.o. M) Treating RN: Levora Dredge Primary Care Alva Kuenzel: Alysia Penna Other Clinician: Referring Anis Degidio: Alysia Penna Treating Champion Corales/Extender: Skipper Cliche in Treatment: 2 Active Inactive Orientation to the Wound Care Program Nursing Diagnoses: Knowledge deficit related to the wound healing center program Goals: Patient/caregiver will verbalize understanding of the [place] Program Date Initiated: 08/08/2021 Target Resolution Date: 08/08/2021 Goal Status: Active Interventions: Provide education on orientation to the wound center Notes: Wound/Skin Impairment Nursing Diagnoses: Impaired tissue integrity Knowledge deficit related to ulceration/compromised skin integrity Goals: Ulcer/skin breakdown will have a volume  reduction of 30% by week 4 Date Initiated: 08/08/2021 Target Resolution Date: 09/05/2021 Goal Status: Active Ulcer/skin breakdown will have a volume reduction of 50% by week 8 Date Initiated: 08/08/2021 Target Resolution Date: 10/03/2021 Goal Status: Active Ulcer/skin breakdown will have a volume reduction of 80% by week 12 Date Initiated: 08/08/2021 Target Resolution Date: 10/31/2021 Goal Status: Active Ulcer/skin breakdown will heal within 14 weeks Date Initiated: 08/08/2021 Target Resolution Date: 11/14/2021 Goal Status: Active Interventions: Assess patient/caregiver ability to obtain necessary supplies Assess patient/caregiver ability to perform ulcer/skin care regimen upon admission and as needed Assess ulceration(s) every visit Provide education on ulcer and skin care Notes: Electronic Signature(s) Signed: 08/22/2021 3:20:36 PM By: Levora Dredge Entered By: Levora Dredge on 08/22/2021 14:18:41 Tacey, Imir [name]. ([id number]) -------------------------------------------------------------------------------- Pain  Assessment Details Patient Name: [name], [name]. Date of Service: 08/22/2021 2:15 PM Medical Record Number: [id number] Patient Account Number: [id number] Date of Birth/Sex: [birth year] (66 y.o. M) Treating RN: Levora Dredge Primary Care Esperanza Madrazo: Alysia Penna Other Clinician: Referring Devinne Epstein: Alysia Penna Treating Martavis Gurney/Extender: Skipper Cliche in Treatment: 2 Active Problems Location of Pain Severity and Description of Pain Patient Has Paino No Site Locations Rate the pain. Current Pain Level: 0 Pain Management and Medication Current Pain Management: Electronic Signature(s) Signed: 08/22/2021 3:20:36 PM By: Levora Dredge Entered By: Levora Dredge on 08/22/2021 14:03:14 Bauernfeind, Kaydn [name]. ([id number]) -------------------------------------------------------------------------------- Patient/Caregiver Education Details Patient Name: [name], [name]. Date of Service: 08/22/2021 2:15 PM Medical Record Number: [id number] Patient Account Number: [id number] Date of Birth/Gender: 1955/10/04 (66 y.o. M) Treating RN: Levora Dredge Primary Care Physician: Alysia Penna Other Clinician: Referring Physician: Alysia Penna Treating Physician/Extender: Skipper Cliche in Treatment: 2 Education Assessment Education Provided To: Patient Education Topics Provided Wound/Skin Impairment: Handouts: Caring for Your Ulcer Methods: Explain/Verbal Responses: State content correctly Electronic Signature(s) Signed: 08/22/2021 3:20:36 PM By: Levora Dredge Entered By: Levora Dredge on 08/22/2021 14:38:47 Kolle, Abas [name]. ([id number]) -------------------------------------------------------------------------------- Wound Assessment Details Patient Name: [name], [name]. Date of Service: 08/22/2021 2:15 PM Medical Record Number: [id number] Patient Account Number: [id number] Date of Birth/Sex: March 19, 1956 (66 y.o. M) Treating RN: Levora Dredge Primary Care Maleeha Halls: Alysia Penna Other Clinician: Referring  Chelsie Burel: Alysia Penna Treating Eliyah Mcshea/Extender: Jeri Cos Weeks in Treatment: 2 Wound Status Wound Number: 1 Primary Etiology: Diabetic Wound/Ulcer of the Lower Extremity Wound Location: Left Calcaneus Wound Status: Open Wounding Event: Pressure Injury Comorbid Cataracts, Hypertension, Type II Diabetes, History: Neuropathy Date Acquired: 06/19/2021 Weeks Of Treatment: 2 Clustered Wound: No Photos Wound Measurements Length: (cm) 2.8 Width: (cm) 1.5 Depth: (cm) 0.1 Area: (cm) 3.299 Volume: (cm) 0.33 % Reduction in Area: -45.3% % Reduction in Volume: -45.4% Epithelialization: Small (1-33%) Tunneling: No Undermining: No Wound Description Classification: Grade 2 Exudate Amount: Medium Exudate Type: Serosanguineous Exudate Color: red, brown Foul Odor After Cleansing: No Slough/Fibrino Yes Wound Bed Granulation Amount: Small (1-33%) Exposed Structure Granulation Quality: Pink, Pale Fat Layer (Subcutaneous Tissue) Exposed: Yes Necrotic Amount: Large (67-100%) Necrotic Quality: Adherent Slough Treatment Notes Wound #1 (Calcaneus) Wound Laterality: Left Cleanser [place] Ancillary Kit - 15 Day Supply Discharge Instruction: Use supplies as instructed; Kit contains: (15) Saline Bullets; (15) 3x3 Gauze; 15 pr Gloves Peri-Wound Care Topical Primary Dressing Bogan, Blayton [name]. ([id number]) IODOFLEX 0.9% Cadexomer Iodine Pad Discharge Instruction: Apply Iodoflex to wound bed only as directed. Secondary Dressing (SILCONE BORDER) Zetuvit Plus SILICONE BORDER Dressing 5x5 (in/in) Secured With Compression Wrap Compression Stockings Add-Ons Electronic Signature(s) Signed: 08/22/2021 3:20:36 PM By: Levora Dredge Entered By: Levora Dredge on 08/22/2021 14:12:05 Laske,  Leul Viona Gilmore ([id number]) -------------------------------------------------------------------------------- Vitals Details Patient Name: [name], [name]. Date of Service: 08/22/2021 2:15 PM Medical Record Number:  [id number] Patient Account Number: [id number] Date of Birth/Sex: 26-Jan-1956 (66 y.o. M) Treating RN: Levora Dredge Primary Care Asahd Can: Alysia Penna Other Clinician: Referring Brinlee Gambrell: Alysia Penna Treating Kobe Ofallon/Extender: Skipper Cliche in Treatment: 2 Vital Signs Time Taken: 14:02 Temperature (F): 97.9 Height (in): 68 Pulse (bpm): 84 Weight (lbs): 220 Respiratory Rate (breaths/min): 18 Body Mass Index (BMI): 33.4 Blood Pressure (mmHg): 167/91 Reference Range: 80 - 120 mg / dl Notes pt asymptomatic, states going to see PCP tomorrow and plans to talk with him about going back on BP med Electronic Signature(s) Signed: 08/22/2021 3:20:36 PM By: Levora Dredge Entered By: Levora Dredge on 08/22/2021 14:40:46

## 2021-08-22 NOTE — Progress Notes (Addendum)
Vazguez, Dyllin Viona Gilmore ([id number]) Visit Report for 08/22/2021 Chief Complaint Document Details Patient Name: [name], [name] Date of Service: 08/22/2021 2:15 PM Medical Record Number: [id number] Patient Account Number: [id number] Date of Birth/Sex: Apr 13, 1956 (66 y.o. M) Treating RN: Levora Dredge Primary Care Provider: Alysia Penna Other Clinician: Referring Provider: Alysia Penna Treating Provider/Extender: Skipper Cliche in Treatment: 2 Information Obtained from: Patient Chief Complaint Left heel ulcer Electronic Signature(s) Signed: 08/22/2021 2:17:44 PM By: Worthy Keeler PA-C Entered By: Worthy Keeler on 08/22/2021 14:17:44 [name], [name] ([id number]) -------------------------------------------------------------------------------- Debridement Details Patient Name: [name], [name] Date of Service: 08/22/2021 2:15 PM Medical Record Number: [id number] Patient Account Number: [id number] Date of Birth/Sex: 12-Dec-1955 (66 y.o. M) Treating RN: Levora Dredge Primary Care Provider: Alysia Penna Other Clinician: Referring Provider: Alysia Penna Treating Provider/Extender: Skipper Cliche in Treatment: 2 Debridement Performed for Wound #1 Left Calcaneus Assessment: Performed By: Physician Tommie Sams., PA-C Debridement Type: Debridement Severity of Tissue Pre Debridement: Fat layer exposed Level of Consciousness (Pre- Awake and Alert procedure): Pre-procedure Verification/Time Out Yes - 14:19 Taken: Total Area Debrided (L x W): 2.8 (cm) x 1.5 (cm) = 4.2 (cm) Tissue and other material Non-Viable, Callus debrided: Level: Non-Viable Tissue Debridement Description: Selective/Open Wound Instrument: Curette Bleeding: None Response to Treatment: Procedure was tolerated well Level of Consciousness (Post- Awake and Alert procedure): Post Debridement Measurements of Total Wound Length: (cm) 2.8 Width: (cm) 1.5 Depth: (cm) 0.1 Volume: (cm) 0.33 Character of Wound/Ulcer Post  Debridement: Stable Severity of Tissue Post Debridement: Fat layer exposed Post Procedure Diagnosis Same as Pre-procedure Electronic Signature(s) Signed: 08/22/2021 3:20:36 PM By: Levora Dredge Signed: 08/22/2021 4:03:57 PM By: Worthy Keeler PA-C Entered By: Levora Dredge on 08/22/2021 14:20:47 [name], [name] ([id number]) -------------------------------------------------------------------------------- HPI Details Patient Name: Delaine, Riddik W. Date of Service: 08/22/2021 2:15 PM Medical Record Number: [id number] Patient Account Number: [id number] Date of Birth/Sex: 01/23/1956 (66 y.o. M) Treating RN: Levora Dredge Primary Care Provider: Alysia Penna Other Clinician: Referring Provider: Alysia Penna Treating Provider/Extender: Skipper Cliche in Treatment: 2 History of Present Illness HPI Description: 08/08/2021 upon evaluation today patient appears to be doing somewhat poorly in regard to a heel ulcer that began as a result of the patient tells me him trying to get his foot into his shoes that were somewhat tight. This was around 4 January. He was using a shoehorn fairly aggressively and thinks that he may have caused the bruising at that time. Fortunately there does not appear to be any signs of infection at this point. Nonetheless he does have some definite necrotic tissue. This can need to be cleared away. Nonetheless unfortunately [name] we do not have clearance in my opinion therefore to go forward with an aggressive sharp debridement. Patient does have diabetes mellitus type 2 with the most recent hemoglobin A1c of 5.2 and that was on June 2022. He also has a history of chronic venous insufficiency, peripheral vascular disease, and hypertension. Obviously with regard to the peripheral vascular disease we will continue to send him to vascular to quantify his ABIs and TBI's. 08/22/2021 upon evaluation today patient appears to be doing well with regard to his wound.  This is definitely showing some signs of improvement which is great news and overall very pleased with where things stand today. There does not appear to be any evidence of active infection locally nor systemically which is great news and overall I feel like that this is showing signs of improvement. It  is definitely much less irritated as compared to last week. I am very pleased in that regard I am good to be able to get a lot of this dry skin off today as well which is great news. Electronic Signature(s) Signed: 08/22/2021 2:33:06 PM By: Worthy Keeler PA-C Entered By: Worthy Keeler on 08/22/2021 14:33:06 [name], [name] ([id number]) -------------------------------------------------------------------------------- Physical Exam Details Patient Name: [name], [name] Date of Service: 08/22/2021 2:15 PM Medical Record Number: [id number] Patient Account Number: [id number] Date of Birth/Sex: 06-Oct-1955 (66 y.o. M) Treating RN: Levora Dredge Primary Care Provider: Alysia Penna Other Clinician: Referring Provider: Alysia Penna Treating Provider/Extender: Jeri Cos Weeks in Treatment: 2 Constitutional Well-nourished and well-hydrated in no acute distress. Respiratory normal breathing without difficulty. Psychiatric this patient is able to make decisions and demonstrates good insight into disease process. Alert and Oriented x 3. pleasant and cooperative. Notes Upon inspection patient's wound actually did have some callus hanging off that I did trim away. Fortunately I did not have to do any sharp debridement centrally there was some eschar but this is still not loose enough he really did not want me to do anything that would cause this to bleed dramatically. For that reason I would hold off on that for the time being. Electronic Signature(s) Signed: 08/22/2021 2:50:54 PM By: Worthy Keeler PA-C Entered By: Worthy Keeler on 08/22/2021 14:50:54 [name], [name]  ([id number]) -------------------------------------------------------------------------------- Physician Orders Details Patient Name: [name], [name] Date of Service: 08/22/2021 2:15 PM Medical Record Number: [id number] Patient Account Number: [id number] Date of Birth/Sex: December 22, 1955 (66 y.o. M) Treating RN: Levora Dredge Primary Care Provider: Alysia Penna Other Clinician: Referring Provider: Alysia Penna Treating Provider/Extender: Skipper Cliche in Treatment: 2 Verbal / Phone Orders: No Diagnosis Coding ICD-10 Coding Code Description E11.621 Type 2 diabetes mellitus with foot ulcer L97.422 Non-pressure chronic ulcer of left heel and midfoot with fat layer exposed I87.2 Venous insufficiency (chronic) (peripheral) I73.89 Other specified peripheral vascular diseases I10 Essential (primary) hypertension Follow-up Appointments o Return Appointment in 1 week. o Nurse Visit as needed [employer] o Wash wounds with antibacterial soap and water. o May shower; gently cleanse wound with antibacterial soap, rinse and pat dry prior to dressing wounds - shower with dressing on and remove after shower o No tub bath. Edema Control - Lymphedema / Segmental Compressive Device / Other o Elevate, Exercise Daily and Avoid Standing for Slifer Periods of Time. o Elevate leg(s) parallel to the floor when sitting. o DO YOUR BEST to sleep in the bed at night. DO NOT sleep in your recliner. Bargo hours of sitting in a recliner leads to swelling of the legs and/or potential wounds on your backside. Wound Treatment Wound #1 - Calcaneus Wound Laterality: Left Cleanser: Byram Ancillary Kit - 15 Day Supply (Generic) 3 x Per Week/30 Days Discharge Instructions: Use supplies as instructed; Kit contains: (15) Saline Bullets; (15) 3x3 Gauze; 15 pr Gloves Primary Dressing: IODOFLEX 0.9% Cadexomer Iodine Pad (Generic) 3 x Per Week/30 Days Discharge Instructions: Apply Iodoflex to wound bed  only as directed. Secondary Dressing: (SILCONE BORDER) Zetuvit Plus SILICONE BORDER Dressing 5x5 (in/in) (Generic) 3 x Per Week/30 Days Electronic Signature(s) Signed: 08/22/2021 3:20:36 PM By: Levora Dredge Signed: 08/22/2021 4:03:57 PM By: Worthy Keeler PA-C Entered By: Levora Dredge on 08/22/2021 14:22:06 [name], [name] ([id number]) -------------------------------------------------------------------------------- Problem List Details Patient Name: [name], [name] Date of Service: 08/22/2021 2:15 PM Medical Record Number: [id number] Patient Account Number: [id number] Date of Birth/Sex:  [birth year] (66 y.o. M) Treating RN: Levora Dredge Primary Care Provider: Alysia Penna Other Clinician: Referring Provider: Alysia Penna Treating Provider/Extender: Jeri Cos Weeks in Treatment: 2 Active Problems ICD-10 Encounter Code Description Active Date MDM Diagnosis E11.621 Type 2 diabetes mellitus with foot ulcer 08/08/2021 No Yes L97.422 Non-pressure chronic ulcer of left heel and midfoot with fat layer 08/08/2021 No Yes exposed I87.2 Venous insufficiency (chronic) (peripheral) 08/08/2021 No Yes I73.89 Other specified peripheral vascular diseases 08/08/2021 No Yes I10 Essential (primary) hypertension 08/08/2021 No Yes Inactive Problems Resolved Problems Electronic Signature(s) Signed: 08/22/2021 2:17:23 PM By: Worthy Keeler PA-C Entered By: Worthy Keeler on 08/22/2021 14:17:23 [name], [name] ([id number]) -------------------------------------------------------------------------------- Progress Note Details Patient Name: [name], [name] Date of Service: 08/22/2021 2:15 PM Medical Record Number: [id number] Patient Account Number: [id number] Date of Birth/Sex: [birth year] (66 y.o. M) Treating RN: Levora Dredge Primary Care Provider: Alysia Penna Other Clinician: Referring Provider: Alysia Penna Treating Provider/Extender: Skipper Cliche in Treatment: 2 Subjective Chief Complaint Information  obtained from Patient Left heel ulcer History of Present Illness (HPI) 08/08/2021 upon evaluation today patient appears to be doing somewhat poorly in regard to a heel ulcer that began as a result of the patient tells me him trying to get his foot into his shoes that were somewhat tight. This was around 4 January. He was using a shoehorn fairly aggressively and thinks that he may have caused the bruising at that time. Fortunately there does not appear to be any signs of infection at this point. Nonetheless he does have some definite necrotic tissue. This can need to be cleared away. Nonetheless unfortunately [name] we do not have clearance in my opinion therefore to go forward with an aggressive sharp debridement. Patient does have diabetes mellitus type 2 with the most recent hemoglobin A1c of 5.2 and that was on June 2022. He also has a history of chronic venous insufficiency, peripheral vascular disease, and hypertension. Obviously with regard to the peripheral vascular disease we will continue to send him to vascular to quantify his ABIs and TBI's. 08/22/2021 upon evaluation today patient appears to be doing well with regard to his wound. This is definitely showing some signs of improvement which is great news and overall very pleased with where things stand today. There does not appear to be any evidence of active infection locally nor systemically which is great news and overall I feel like that this is showing signs of improvement. It is definitely much less irritated as compared to last week. I am very pleased in that regard I am good to be able to get a lot of this dry skin off today as well which is great news. Objective Constitutional Well-nourished and well-hydrated in no acute distress. Vitals Time Taken: 2:02 PM, Height: 68 in, Weight: 220 lbs, BMI: 33.4, Temperature: 97.9 F, Pulse: 84 bpm, Respiratory Rate: 18 breaths/min, Blood Pressure: 167/91 mmHg. General  Notes: pt asymptomatic, states going to see PCP tomorrow and plans to talk with him about going back on BP med Respiratory normal breathing without difficulty. Psychiatric this patient is able to make decisions and demonstrates good insight into disease process. Alert and Oriented x 3. pleasant and cooperative. General Notes: Upon inspection patient's wound actually did have some callus hanging off that I did trim away. Fortunately I did not have to do any sharp debridement centrally there was some eschar but this is still not loose enough he really did not want me to do anything that  would cause this to bleed dramatically. For that reason I would hold off on that for the time being. Integumentary (Hair, Skin) Wound #1 status is Open. Original cause of wound was Pressure Injury. The date acquired was: 06/19/2021. The wound has been in treatment 2 weeks. The wound is located on the Left Calcaneus. The wound measures 2.8cm length x 1.5cm width x 0.1cm depth; 3.299cm^2 area and 0.33cm^3 volume. There is Fat Layer (Subcutaneous Tissue) exposed. There is no tunneling or undermining noted. There is a medium amount of serosanguineous drainage noted. There is small (1-33%) pink, pale granulation within the wound bed. There is a large (67-100%) amount of necrotic tissue within the wound bed including Adherent Slough. Assessment [name], [name] ([id number]) Active Problems ICD-10 Type 2 diabetes mellitus with foot ulcer Non-pressure chronic ulcer of left heel and midfoot with fat layer exposed Venous insufficiency (chronic) (peripheral) Other specified peripheral vascular diseases Essential (primary) hypertension Procedures Wound #1 Pre-procedure diagnosis of Wound #1 is a Diabetic Wound/Ulcer of the Lower Extremity located on the Left Calcaneus .Severity of Tissue Pre Debridement is: Fat layer exposed. There was a Selective/Open Wound Non-Viable Tissue Debridement with a total area of 4.2 sq cm  performed by Tommie Sams., PA-C. With the following instrument(s): Curette to remove Non-Viable tissue/material. Material removed includes Callus. No specimens were taken. A time out was conducted at 14:19, prior to the start of the procedure. There was no bleeding. The procedure was tolerated well. Post Debridement Measurements: 2.8cm length x 1.5cm width x 0.1cm depth; 0.33cm^3 volume. Character of Wound/Ulcer Post Debridement is stable. Severity of Tissue Post Debridement is: Fat layer exposed. Post procedure Diagnosis Wound #1: Same as Pre-Procedure Plan Follow-up Appointments: Return Appointment in 1 week. Nurse Visit as needed Bathing/ Shower/ Hygiene: Wash wounds with antibacterial soap and water. May shower; gently cleanse wound with antibacterial soap, rinse and pat dry prior to dressing wounds - shower with dressing on and remove after shower No tub bath. Edema Control - Lymphedema / Segmental Compressive Device / Other: Elevate, Exercise Daily and Avoid Standing for [place] Periods of Time. Elevate leg(s) parallel to the floor when sitting. DO YOUR BEST to sleep in the bed at night. DO NOT sleep in your recliner. Sofia hours of sitting in a recliner leads to swelling of the legs and/or potential wounds on your backside. WOUND #1: - Calcaneus Wound Laterality: Left Cleanser: Byram Ancillary Kit - 15 Day Supply (Generic) 3 x Per Week/30 Days Discharge Instructions: Use supplies as instructed; Kit contains: (15) Saline Bullets; (15) 3x3 Gauze; 15 pr Gloves Primary Dressing: IODOFLEX 0.9% Cadexomer Iodine Pad (Generic) 3 x Per Week/30 Days Discharge Instructions: Apply Iodoflex to wound bed only as directed. Secondary Dressing: (SILCONE BORDER) Zetuvit Plus SILICONE BORDER Dressing 5x5 (in/in) (Generic) 3 x Per Week/30 Days 1. Would recommend currently that we going continue with the wound care measures as before and the patient is in agreement with plan. This includes the use of the  Iodosorb/Iodoflex. I think either is fine and they typically work about the same in either way I think that this is a good option. 2. Also can recommend at this time that we continue with the border foam dressing to cover which I think is doing a good job here as well. We will see patient back for reevaluation in 1 week here in the clinic. If anything worsens or changes patient will contact our office for additional recommendations. Electronic Signature(s) Signed: 08/22/2021 2:52:22 PM By: Melburn Hake,  Nolin Grell PA-C Entered By: Worthy Keeler on 08/22/2021 14:52:22 [name], Jame W. ([id number]) -------------------------------------------------------------------------------- SuperBill Details Patient Name: Gravelle, Aayden W. Date of Service: 08/22/2021 Medical Record Number: [id number] Patient Account Number: [id number] Date of Birth/Sex: May 08, 1956 (66 y.o. M) Treating RN: Levora Dredge Primary Care Provider: Alysia Penna Other Clinician: Referring Provider: Alysia Penna Treating Provider/Extender: Jeri Cos Weeks in Treatment: 2 Diagnosis Coding ICD-10 Codes Code Description [phone] Type 2 diabetes mellitus with foot ulcer L97.422 Non-pressure chronic ulcer of left heel and midfoot with fat layer exposed I87.2 Venous insufficiency (chronic) (peripheral) I73.89 Other specified peripheral vascular diseases I10 Essential (primary) hypertension Facility Procedures CPT4 Code: [id number] Description: [phone] - DEBRIDE WOUND 1ST 20 SQ CM OR < Modifier: Quantity: 1 CPT4 Code: Description: ICD-10 Diagnosis Description L97.422 Non-pressure chronic ulcer of left heel and midfoot with fat layer expos Modifier: ed Quantity: Physician Procedures CPT4 Code: [id number] Description: 97597 - WC PHYS DEBR WO ANESTH 20 SQ CM Modifier: Quantity: 1 CPT4 Code: Description: ICD-10 Diagnosis Description L97.422 Non-pressure chronic ulcer of left heel and midfoot with fat layer expos Modifier: ed Quantity: Electronic  Signature(s) Signed: 08/22/2021 2:52:42 PM By: Worthy Keeler PA-C Entered By: Worthy Keeler on 08/22/2021 14:52:42

## 2021-08-23 ENCOUNTER — Ambulatory Visit (INDEPENDENT_AMBULATORY_CARE_PROVIDER_SITE_OTHER): Payer: No Typology Code available for payment source | Admitting: Family Medicine

## 2021-08-23 ENCOUNTER — Encounter: Payer: Self-pay | Admitting: Family Medicine

## 2021-08-23 VITALS — BP 124/76 | HR 66 | Temp 98.0°F | Ht 68.0 in | Wt 208.0 lb

## 2021-08-23 DIAGNOSIS — Z794 Long term (current) use of insulin: Secondary | ICD-10-CM | POA: Diagnosis not present

## 2021-08-23 DIAGNOSIS — E039 Hypothyroidism, unspecified: Secondary | ICD-10-CM | POA: Diagnosis not present

## 2021-08-23 DIAGNOSIS — Z0000 Encounter for general adult medical examination without abnormal findings: Secondary | ICD-10-CM | POA: Diagnosis not present

## 2021-08-23 DIAGNOSIS — E1140 Type 2 diabetes mellitus with diabetic neuropathy, unspecified: Secondary | ICD-10-CM | POA: Diagnosis not present

## 2021-08-23 LAB — CBC WITH DIFFERENTIAL/PLATELET
Basophils Absolute: 0.1 10*3/uL (ref 0.0–0.1)
Basophils Relative: 0.9 % (ref 0.0–3.0)
Eosinophils Absolute: 0.2 10*3/uL (ref 0.0–0.7)
Eosinophils Relative: 2.3 % (ref 0.0–5.0)
HCT: 39.4 % (ref 39.0–52.0)
Hemoglobin: 13.9 g/dL (ref 13.0–17.0)
Lymphocytes Relative: 21.8 % (ref 12.0–46.0)
Lymphs Abs: 1.9 10*3/uL (ref 0.7–4.0)
MCHC: 35.3 g/dL (ref 30.0–36.0)
MCV: 95.1 fl (ref 78.0–100.0)
Monocytes Absolute: 0.6 10*3/uL (ref 0.1–1.0)
Monocytes Relative: 6.5 % (ref 3.0–12.0)
Neutro Abs: 6 10*3/uL (ref 1.4–7.7)
Neutrophils Relative %: 68.5 % (ref 43.0–77.0)
Platelets: 165 10*3/uL (ref 150.0–400.0)
RBC: 4.14 Mil/uL — ABNORMAL LOW (ref 4.22–5.81)
RDW: 13.5 % (ref 11.5–15.5)
WBC: 8.7 10*3/uL (ref 4.0–10.5)

## 2021-08-23 LAB — T4, FREE: Free T4: 1.05 ng/dL (ref 0.60–1.60)

## 2021-08-23 LAB — HEPATIC FUNCTION PANEL
ALT: 29 U/L (ref 0–53)
AST: 26 U/L (ref 0–37)
Albumin: 4.3 g/dL (ref 3.5–5.2)
Alkaline Phosphatase: 98 U/L (ref 39–117)
Bilirubin, Direct: 0.2 mg/dL (ref 0.0–0.3)
Total Bilirubin: 1 mg/dL (ref 0.2–1.2)
Total Protein: 7.4 g/dL (ref 6.0–8.3)

## 2021-08-23 LAB — LIPID PANEL
Cholesterol: 114 mg/dL (ref 0–200)
HDL: 44.5 mg/dL (ref >39.00)
LDL Cholesterol: 50 mg/dL (ref 0–99)
NonHDL: 69.49
Total CHOL/HDL Ratio: 3
Triglycerides: 98 mg/dL (ref 0.0–149.0)
VLDL: 19.6 mg/dL (ref 0.0–40.0)

## 2021-08-23 LAB — HEMOGLOBIN A1C: Hgb A1c MFr Bld: 5.9 % (ref 4.6–6.5)

## 2021-08-23 LAB — BASIC METABOLIC PANEL
BUN: 25 mg/dL — ABNORMAL HIGH (ref 6–23)
CO2: 30 mEq/L (ref 19–32)
Calcium: 9.4 mg/dL (ref 8.4–10.5)
Chloride: 104 mEq/L (ref 96–112)
Creatinine, Ser: 0.97 mg/dL (ref 0.40–1.50)
GFR: 82.98 mL/min (ref >60.00)
Glucose, Bld: 112 mg/dL — ABNORMAL HIGH (ref 70–99)
Potassium: 4.8 mEq/L (ref 3.5–5.1)
Sodium: 141 mEq/L (ref 135–145)

## 2021-08-23 LAB — MICROALBUMIN / CREATININE URINE RATIO
Creatinine,U: 131.2 mg/dL
Microalb Creat Ratio: 2.1 mg/g (ref 0.0–30.0)
Microalb, Ur: 2.8 mg/dL — ABNORMAL HIGH (ref 0.0–1.9)

## 2021-08-23 LAB — T3, FREE: T3, Free: 2.6 pg/mL (ref 2.3–4.2)

## 2021-08-23 LAB — TSH: TSH: 4.94 u[IU]/mL (ref 0.35–5.50)

## 2021-08-23 LAB — PSA: PSA: 0.48 ng/mL (ref 0.10–4.00)

## 2021-08-23 MED ORDER — METOPROLOL SUCCINATE ER 25 MG PO TB24: 25.0000 mg | ORAL_TABLET | Freq: Every day | ORAL | 3 refills | Status: DC

## 2021-08-23 NOTE — Progress Notes (Signed)
? ?Subjective:  ? ? Patient ID: [name], male    DOB: 30-Aug-1955, 66 y.o.   MRN: [id number] ? ?HPI ?Here for a well exam. He is pleased with his progress at this point. His polyneuropathy is slowly improving and he has more sensation in the hands and feet. He has been able to switch from using a walker to using a cane to get around. He plans to retire at the end of this month. His glucoses have ranged from 90 to 150 in the mornings. His BP has been stable. He continues to see the [facility] for a pressure ulcer on the left heel.  ? ? ?Review of Systems  ?Constitutional: Negative.   ?HENT: Negative.    ?Eyes: Negative.   ?Respiratory: Negative.    ?Cardiovascular: Negative.   ?Gastrointestinal: Negative.   ?Genitourinary: Negative.   ?Musculoskeletal: Negative.   ?Skin: Negative.   ?Neurological:  Positive for weakness and numbness.  ?Psychiatric/Behavioral: Negative.    ? ?   ?Objective:  ? Physical Exam ?Constitutional:   ?   General: He is not in acute distress. ?   Appearance: He is well-developed. He is not diaphoretic.  ?   Comments: Walks with a cane. He is somewhat unsteady on his feet.   ?HENT:  ?   Head: Normocephalic and atraumatic.  ?   Right Ear: External ear normal.  ?   Left Ear: External ear normal.  ?   Nose: Nose normal.  ?   Mouth/Throat:  ?   Pharynx: No oropharyngeal exudate.  ?Eyes:  ?   General: No scleral icterus.    ?   Right eye: No discharge.     ?   Left eye: No discharge.  ?   Conjunctiva/sclera: Conjunctivae normal.  ?   Pupils: Pupils are equal, round, and reactive to light.  ?Neck:  ?   Thyroid: No thyromegaly.  ?   Vascular: No JVD.  ?   Trachea: No tracheal deviation.  ?Cardiovascular:  ?   Rate and Rhythm: Normal rate and regular rhythm.  ?   Heart sounds: Normal heart sounds. No murmur heard. ?  No friction rub. No gallop.  ?Pulmonary:  ?   Effort: Pulmonary effort is normal. No respiratory distress.  ?   Breath sounds: Normal breath sounds. No wheezing or rales.  ?Chest:  ?    Chest wall: No tenderness.  ?Abdominal:  ?   General: Bowel sounds are normal. There is no distension.  ?   Palpations: Abdomen is soft. There is no mass.  ?   Tenderness: There is no abdominal tenderness. There is no guarding or rebound.  ?Genitourinary: ?   Penis: Normal. No tenderness.   ?   Testes: Normal.  ?   Prostate: Normal.  ?   Rectum: Normal. Guaiac result negative.  ?Musculoskeletal:     ?   General: No tenderness. Normal range of motion.  ?   Cervical back: Neck supple.  ?Lymphadenopathy:  ?   Cervical: No cervical adenopathy.  ?Skin: ?   General: Skin is warm and dry.  ?   Coloration: Skin is not pale.  ?   Findings: No erythema or rash.  ?Neurological:  ?   Mental Status: He is alert and oriented to person, place, and time.  ?   Cranial Nerves: No cranial nerve deficit.  ?   Motor: No abnormal muscle tone.  ?   Coordination: Coordination normal.  ?   Deep Tendon  Reflexes: Reflexes are normal and symmetric. Reflexes normal.  ?Psychiatric:     ?   Behavior: Behavior normal.     ?   Thought Content: Thought content normal.     ?   Judgment: Judgment normal.  ? ? ? ? ? ?   ?Assessment & Plan:  ?Well exam. We discussed diet and exercise. Get fasting labs today including an A1c and a thyroid panel. His HTN is well controlled. He will see his Ophthalmologist later today for an eye exam. He has never had a colonoscopy, and we discussed this today. I strongly urged him to have one, but he wants to think about this first.  ?[name], MD ? ? ?

## 2021-08-25 LAB — HM DIABETES EYE EXAM

## 2021-08-29 ENCOUNTER — Encounter: Payer: No Typology Code available for payment source | Admitting: Internal Medicine

## 2021-09-05 ENCOUNTER — Encounter: Payer: No Typology Code available for payment source | Admitting: Physician Assistant

## 2021-09-05 ENCOUNTER — Other Ambulatory Visit: Payer: Self-pay

## 2021-09-05 DIAGNOSIS — E11621 Type 2 diabetes mellitus with foot ulcer: Secondary | ICD-10-CM | POA: Diagnosis not present

## 2021-09-05 NOTE — Progress Notes (Addendum)
Mclean, Baer W. ([id number]) ?Visit Report for 09/05/2021 ?Chief Complaint Document Details ?Patient Name: [name], [name] ?Date of Service: 09/05/2021 9:00 AM ?Medical Record Number: [id number] ?Patient Account Number: [id number] ?Date of Birth/Sex: 1956-03-17 (66 y.o. M) ?Treating RN: Levora Dredge ?Primary Care Provider: Alysia Penna Other Clinician: ?Referring Provider: Alysia Penna ?Treating Provider/Extender: Jeri Cos ?Weeks in Treatment: 4 ?Information Obtained from: Patient ?Chief Complaint ?Left heel ulcer ?Electronic Signature(s) ?Signed: 09/05/2021 9:08:52 AM By: Worthy Keeler PA-C ?Entered By: Worthy Keeler on 09/05/2021 09:08:52 ?[name], [name] ?-------------------------------------------------------------------------------- ?Debridement Details ?Patient Name: [name], [name] ?Date of Service: 09/05/2021 9:00 AM ?Medical Record Number: [id number] ?Patient Account Number: [id number] ?Date of Birth/Sex: [birth year] (66 y.o. M) ?Treating RN: Sharyn Creamer ?Primary Care Provider: Alysia Penna Other Clinician: ?Referring Provider: Alysia Penna ?Treating Provider/Extender: Jeri Cos ?Weeks in Treatment: 4 ?Debridement Performed for ?Wound #1 Left Calcaneus ?Assessment: ?Performed By: Physician Tommie Sams., PA-C ?Debridement Type: Debridement ?Severity of Tissue Pre Debridement: Fat layer exposed ?Level of Consciousness (Pre- ?Awake and Alert ?procedure): ?Pre-procedure Verification/Time Out ?Yes - 09:35 ?Taken: ?Start Time: 09:35 ?Total Area Debrided (L x W): 2 (cm) x 3 (cm) = 6 (cm?) ?Tissue and other material ?Viable, Non-Viable, Eschar, Slough, Subcutaneous, [place] ?debrided: ?Level: Skin/Subcutaneous Tissue ?Debridement Description: Excisional ?Instrument: Curette ?Bleeding: Minimum ?Hemostasis Achieved: Pressure ?End Time: 09:40 ?Procedural Pain: 0 ?Post Procedural Pain: 0 ?Response to Treatment: Procedure was tolerated well ?Level of Consciousness (Post- ?Awake and Alert ?procedure): ?Post  Debridement Measurements of Total Wound ?Length: (cm) 1.7 ?Width: (cm) 2.8 ?Depth: (cm) 0.3 ?Volume: (cm?) 1.122 ?Character of Wound/Ulcer Post Debridement: Improved ?Severity of Tissue Post Debridement: Fat layer exposed ?Post Procedure Diagnosis ?Same as Pre-procedure ?Electronic Signature(s) ?Signed: 09/05/2021 3:58:24 PM By: Sharyn Creamer RN, BSN ?Signed: 09/05/2021 4:18:52 PM By: Worthy Keeler PA-C ?Entered By: Sharyn Creamer on 09/05/2021 09:41:21 ?[name], [name] ?-------------------------------------------------------------------------------- ?HPI Details ?Patient Name: [name] ?Date of Service: 09/05/2021 9:00 AM ?Medical Record Number: [id number] ?Patient Account Number: [id number] ?Date of Birth/Sex: [birth year] (66 y.o. M) ?Treating RN: Levora Dredge ?Primary Care Provider: Alysia Penna Other Clinician: ?Referring Provider: Alysia Penna ?Treating Provider/Extender: Jeri Cos ?Weeks in Treatment: 4 ?History of Present Illness ?HPI Description: 08/08/2021 upon evaluation today patient appears to be doing somewhat poorly in regard to a heel ulcer that began as a result ?of the patient tells me him trying to get his foot into his shoes that were somewhat tight. This was around 4 January. He was using a shoehorn ?fairly aggressively and thinks that he may have caused the bruising at that time. Fortunately there does not appear to be any signs of infection at ?this point. Nonetheless he does have some definite necrotic tissue. This can need to be cleared away. Nonetheless unfortunately his ABIs were ?noncompressible we do not have clearance in my opinion therefore to go forward with an aggressive sharp debridement. ?Patient does have diabetes mellitus type 2 with the most recent hemoglobin A1c of 5.2 and that was on June 2022. He also has a history of ?chronic venous insufficiency, peripheral vascular disease, and hypertension. Obviously with regard to the peripheral vascular disease we  will ?continue to send him to vascular to quantify his ABIs and TBI's. ?08/22/2021 upon evaluation today patient appears to be doing well with regard to his wound. This is definitely showing some signs of improvement ?which is great news and overall very pleased with where things stand today. There does not appear to be any evidence of active  infection locally ?nor systemically which is great news and overall I feel like that this is showing signs of improvement. It is definitely much less irritated as ?compared to last week. I am very pleased in that regard I am good to be able to get a lot of this dry skin off today as well which is great news. ?09/05/2021 upon evaluation today patient's heel actually showing signs of some improvement though there is still a bit of eschar noted in the ?center part of the wound. I do believe this is going require some sharp debridement to clear this away. The Iodosorb just is not quite cutting it. ?Fortunately there does not appear to be any signs of active infection at this time locally nor systemically. ?Electronic Signature(s) ?Signed: 09/05/2021 10:50:34 AM By: Worthy Keeler PA-C ?Entered By: Worthy Keeler on 09/05/2021 10:50:33 ?[name], [name] ([id number]) ?-------------------------------------------------------------------------------- ?Physical Exam Details ?Patient Name: [name], [name] ?Date of Service: 09/05/2021 9:00 AM ?Medical Record Number: [id number] ?Patient Account Number: [id number] ?Date of Birth/Sex: Oct 13, 1955 (66 y.o. M) ?Treating RN: Levora Dredge ?Primary Care Provider: Alysia Penna Other Clinician: ?Referring Provider: Alysia Penna ?Treating Provider/Extender: Jeri Cos ?Weeks in Treatment: 4 ?Constitutional ?Well-nourished and well-hydrated in no acute distress. ?Respiratory ?normal breathing without difficulty. ?Psychiatric ?this patient is able to make decisions and demonstrates good insight into disease process. Alert and Oriented x 3. pleasant and  cooperative. ?Notes ?Upon inspection I did at this point in time today go ahead and do sharp debridement based on the arterial study. The good news is the patient ?does have flow that I think is probably consistent with being able to heal. With that being said there is some evidence here of flow limitation with ?TBI's being somewhat low on the left but on the right it is about the same as well at 0.50 on the right 0.51 on the left. His ABI on the left is 0.98. ?Either way I do believe that he is okay for sharp debridement I will just do this as little as need be and see how he responds. Obviously he could ?always see vascular for evaluation if need be but right now postdebridement he seems to be doing better he did have a little bit of bleeding this ?was minimal controlled with pressure no aggressive sharp debridement was undertaken quite yet [name] high response with this initially. ?Electronic Signature(s) ?Signed: 09/05/2021 10:52:01 AM By: Worthy Keeler PA-C ?Entered By: Worthy Keeler on 09/05/2021 10:52:00 ?[name], [name] ([id number]) ?-------------------------------------------------------------------------------- ?Physician Orders Details ?Patient Name: [name], [name] ?Date of Service: 09/05/2021 9:00 AM ?Medical Record Number: [id number] ?Patient Account Number: [id number] ?Date of Birth/Sex: [birth year] (66 y.o. M) ?Treating RN: Sharyn Creamer ?Primary Care Provider: Alysia Penna Other Clinician: ?Referring Provider: Alysia Penna ?Treating Provider/Extender: Jeri Cos ?Weeks in Treatment: 4 ?Verbal / Phone Orders: No ?Diagnosis Coding ?ICD-10 Coding ?Code Description ?E11.621 Type 2 diabetes mellitus with foot ulcer ?[id number] Non-pressure chronic ulcer of left heel and midfoot with fat layer exposed ?I87.2 Venous insufficiency (chronic) (peripheral) ?I73.89 Other specified peripheral vascular diseases ?I10 Essential (primary) hypertension ?Follow-up Appointments ?o Return Appointment in 1 week. ?o Nurse  Visit as needed ?Bathing/ Shower/ Hygiene ?o Wash wounds with antibacterial soap and water. ?o May shower; gently cleanse wound with antibacterial soap, rinse and pat dry prior to dressing wounds - shower with dress

## 2021-09-05 NOTE — Progress Notes (Addendum)
Sacca, Odas W. ([id number]) ?Visit Report for 09/05/2021 ?Arrival Information Details ?Patient Name: [name], [name] ?Date of Service: 09/05/2021 9:00 AM ?Medical Record Number: [id number] ?Patient Account Number: [id number] ?Date of Birth/Sex: [birth year] (66 y.o. M) ?Treating RN: Sharyn Creamer ?Primary Care Gabrille Kilbride: Alysia Penna Other Clinician: ?Referring Hamp Moreland: Alysia Penna ?Treating Khalani Novoa/Extender: Jeri Cos ?Weeks in Treatment: 4 ?Visit Information History Since Last Visit ?Added or deleted any medications: No ?Patient Arrived: [name] ?Any new allergies or adverse reactions: No ?Arrival Time: 09:11 ?Had a fall or experienced change in No ?Accompanied By: wife ?activities of daily living that may affect ?Transfer Assistance: None ?risk of falls: ?Patient Identification Verified: Yes ?Signs or symptoms of abuse/neglect since last visito No ?Secondary Verification Process Completed: Yes ?Hospitalized since last visit: No ?Patient Has Alerts: Yes ?Implantable device outside of the clinic excluding No ?Patient Alerts: ABI R >1.0 [place] TBI 0.50 ?cellular tissue based products placed in the center ?ABI L 0.98 ?since last visit: ?Has Dressing in Place as Prescribed: Yes ?Pain Present Now: No ?Electronic Signature(s) ?Signed: 09/05/2021 3:58:24 PM By: Sharyn Creamer RN, BSN ?Entered By: Sharyn Creamer on 09/05/2021 10:20:31 ?Cervenka, Saahir W. ([id number]) ?-------------------------------------------------------------------------------- ?Encounter Discharge Information Details ?Patient Name: [name], [name] ?Date of Service: 09/05/2021 9:00 AM ?Medical Record Number: [id number] ?Patient Account Number: [id number] ?Date of Birth/Sex: 11/06/1955 (66 y.o. M) ?Treating RN: Sharyn Creamer ?Primary Care Ivory Bail: Alysia Penna Other Clinician: ?Referring Ivana Nicastro: Alysia Penna ?Treating Terisha Losasso/Extender: Jeri Cos ?Weeks in Treatment: 4 ?Encounter Discharge Information Items Post Procedure Vitals ?Discharge Condition: Stable ?Temperature  (?F): 98.1 ?Ambulatory Status: [name] ?Pulse (bpm): 76 ?Discharge Destination: Home ?Respiratory Rate (breaths/min): 18 ?Transportation: Private Auto ?Blood Pressure (mmHg): 158/81 ?Accompanied By: wife ?Schedule Follow-up Appointment: Yes ?Clinical Summary of Care: Patient Declined ?Electronic Signature(s) ?Signed: 09/05/2021 3:58:24 PM By: Sharyn Creamer RN, BSN ?Entered By: Sharyn Creamer on 09/05/2021 09:47:45 ?[name], [name] ([id number]) ?-------------------------------------------------------------------------------- ?Lower Extremity Assessment Details ?Patient Name: [name], [name] ?Date of Service: 09/05/2021 9:00 AM ?Medical Record Number: [id number] ?Patient Account Number: [id number] ?Date of Birth/Sex: [birth year] (66 y.o. M) ?Treating RN: Sharyn Creamer ?Primary Care Lamorris Knoblock: Alysia Penna Other Clinician: ?Referring Sevon Rotert: Alysia Penna ?Treating Jennafer Gladue/Extender: Jeri Cos ?Weeks in Treatment: 4 ?Edema Assessment ?Assessed: [Left: No] [Right: No] ?[Left: Edema] [Right: :] ?Calf ?Left: Right: ?Point of Measurement: From Medial Instep 37.5 cm ?Ankle ?Left: Right: ?Point of Measurement: From Medial Instep 24.7 cm ?Vascular Assessment ?Pulses: ?Dorsalis Pedis ?Palpable: [Left:Yes] ?Electronic Signature(s) ?Signed: 09/05/2021 3:58:24 PM By: Sharyn Creamer RN, BSN ?Entered By: Sharyn Creamer on 09/05/2021 09:27:34 ?[name], [name] ([id number]) ?-------------------------------------------------------------------------------- ?Multi Wound Chart Details ?Patient Name: [name], [name] ?Date of Service: 09/05/2021 9:00 AM ?Medical Record Number: [id number] ?Patient Account Number: [id number] ?Date of Birth/Sex: [birth year] (66 y.o. M) ?Treating RN: Cornell Barman ?Primary Care Ulys Favia: Alysia Penna Other Clinician: ?Referring Dylyn Mclaren: Alysia Penna ?Treating Codie Hainer/Extender: Jeri Cos ?Weeks in Treatment: 4 ?Vital Signs ?Height(in): 68 Capillary Blood Glucose ?140 ?(mg/dl): ?Weight(lbs): 220 ?Pulse(bpm): 76 ?Body Mass  Index(BMI): 33.4 ?Blood Pressure(mmHg): 158/81 ?Temperature(??F): 98.1 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 ?Comorbid History: Cataracts, Hypertension, Type II N/A N/A ?Diabetes, Neuropathy ?Date Acquired: 06/19/2021 N/A N/A ?Weeks of Treatment: 4 N/A N/A ?Wound Status: Open N/A N/A ?Wound Recurrence: No N/A N/A ?Measurements L x W x D (cm) 2.5x1x0.1 N/A N/A ?Area (cm?) : 1.963 N/A N/A ?Volume (cm?) : 0.196 N/A N/A ?% Reduction in Area: 13.50%  N/A N/A ?% Reduction in Volume: 13.70% N/A N/A ?Classification: Grade 2 N/A N/A ?Exudate Amount: Medium N/A N/A ?Exudate Type: Serosanguineous N/A N/A ?Exudate Color: red, brown N/A N/A ?Granulation Amount: Small (1-33%) N/A N/A ?Granulation Quality: Pink, Pale N/A N/A ?Necrotic Amount: Large (67-100%) N/A N/A ?Exposed Structures: ?Fat Layer (Subcutaneous Tissue): N/A N/A ?Yes ?Epithelialization: Small (1-33%) N/A N/A ?Debridement: Debridement - Excisional N/A N/A ?Pre-procedure Verification/Time 09:35 N/A N/A ?Out Taken: ?Tissue Debrided: Necrotic/Eschar, Subcutaneous, N/A N/A ?Slough ?Level: Skin/Subcutaneous Tissue N/A N/A ?Debridement Area (sq cm): 6 N/A N/A ?Instrument: Curette N/A N/A ?Bleeding: Minimum N/A N/A ?Hemostasis Achieved: Pressure N/A N/A ?Procedural Pain: 0 N/A N/A ?Post Procedural Pain: 0 N/A N/A ?Debridement Treatment Procedure was tolerated well N/A N/A ?Response: ?Post Debridement 1.7x2.8x0.3 N/A N/A ?Measurements L x W x D (cm) ?1.122 N/A N/A ?[name], [name] ([id number]) ?Post Debridement Volume: ?(cm?) ?Procedures Performed: Debridement N/A N/A ?Treatment Notes ?Wound #1 (Calcaneus) Wound Laterality: Left ?Cleanser ?[employer] Ancillary Kit - 15 Day Supply ?Discharge Instruction: Use supplies as instructed; Kit contains: (15) Saline Bullets; (15) 3x3 Gauze; 15 pr Gloves ?Peri-Wound  Care ?Topical ?Primary Dressing ?IODOFLEX 0.9% Cadexomer Iodine Pad ?Discharge Instruction: Apply Iodoflex to wound bed only as directed. ?Secondary Dressing ?(SILCONE BORDER) Zetuvit Plus SILICONE BORDER Dressing 5x5 (in/in) ?Secured With ?Compression Wrap ?Compression Stockings ?Add-Ons ?Electronic Signature(s) ?Signed: 09/06/2021 1:23:04 PM By: Gretta Cool, BSN, RN, CWS, Kim RN, BSN ?Entered By: Gretta Cool, BSN, RN, CWS, Kim on 09/06/2021 13:23:04 ?[name], [name] ([id number]) ?-------------------------------------------------------------------------------- ?Multi-Disciplinary Care Plan Details ?Patient Name: [name], [name] ?Date of Service: 09/05/2021 9:00 AM ?Medical Record Number: [id number] ?Patient Account Number: [id number] ?Date of Birth/Sex: Aug 07, 1955 (66 y.o. M) ?Treating RN: Cornell Barman ?Primary Care Thalya Fouche: Alysia Penna Other Clinician: ?Referring Kinga Cassar: Alysia Penna ?Treating Ralene Gasparyan/Extender: Jeri Cos ?Weeks in Treatment: 4 ?Active Inactive ?Orientation to the Wound Care Program ?Nursing Diagnoses: ?Knowledge deficit related to the wound healing center program ?Goals: ?Patient/caregiver will verbalize understanding of the [place] ?Date Initiated: 08/08/2021 ?Target Resolution Date: 08/08/2021 ?Goal Status: Active ?Interventions: ?Provide education on orientation to the wound center ?Notes: ?Wound/Skin Impairment ?Nursing Diagnoses: ?Impaired tissue integrity ?Knowledge deficit related to ulceration/compromised skin integrity ?Goals: ?Ulcer/skin breakdown will have a volume reduction of 30% by week 4 ?Date Initiated: 08/08/2021 ?Target Resolution Date: 09/05/2021 ?Goal Status: Active ?Ulcer/skin breakdown will have a volume reduction of 50% by week 8 ?Date Initiated: 08/08/2021 ?Target Resolution Date: 10/03/2021 ?Goal Status: Active ?Ulcer/skin breakdown will have a volume reduction of 80% by week 12 ?Date Initiated: 08/08/2021 ?Target Resolution Date: 10/31/2021 ?Goal Status: Active ?Ulcer/skin  breakdown will heal within 14 weeks ?Date Initiated: 08/08/2021 ?Target Resolution Date: 11/14/2021 ?Goal Status: Active ?Interventions: ?Assess patient/caregiver ability to obtain necessary supplies ?Assess patient/caregiv

## 2021-09-12 ENCOUNTER — Other Ambulatory Visit: Payer: Self-pay

## 2021-09-12 ENCOUNTER — Encounter: Payer: No Typology Code available for payment source | Admitting: Physician Assistant

## 2021-09-12 DIAGNOSIS — E11621 Type 2 diabetes mellitus with foot ulcer: Secondary | ICD-10-CM | POA: Diagnosis not present

## 2021-09-12 NOTE — Progress Notes (Addendum)
Barrell, Chazz W. ([id number]) ?Visit Report for 09/12/2021 ?Chief Complaint Document Details ?Patient Name: [name], [name] ?Date of Service: 09/12/2021 9:00 AM ?Medical Record Number: [id number] ?Patient Account Number: [id number] ?Date of Birth/Sex: 03/03/1956 (66 y.o. M) ?Treating RN: Angelina Pih ?Primary Care Provider: [name] Other Clinician: ?Referring Provider: [name] ?Treating Provider/Extender: Allen Derry ?Weeks in Treatment: 5 ?Information Obtained from: Patient ?Chief Complaint ?Left heel ulcer ?Electronic Signature(s) ?Signed: 09/12/2021 9:01:50 AM By: Lenda Kelp PA-C ?Entered By: Lenda Kelp on 09/12/2021 09:01:50 ?Tolen, Ollivander W. ([id number]) ?-------------------------------------------------------------------------------- ?Debridement Details ?Patient Name: [name], [name] ?Date of Service: 09/12/2021 9:00 AM ?Medical Record Number: [id number] ?Patient Account Number: [id number] ?Date of Birth/Sex: December 21, 1955 (66 y.o. M) ?Treating RN: Angelina Pih ?Primary Care Provider: [name] Other Clinician: ?Referring Provider: [name] ?Treating Provider/Extender: Allen Derry ?Weeks in Treatment: 5 ?Debridement Performed for ?Wound #1 Left Calcaneus ?Assessment: ?Performed By: Physician Nelida Meuse., PA-C ?Debridement Type: Debridement ?Severity of Tissue Pre Debridement: Fat layer exposed ?Level of Consciousness (Pre- ?Awake and Alert ?procedure): ?Pre-procedure Verification/Time Out ?Yes - 09:04 ?Taken: ?Total Area Debrided (L x W): 0.9 (cm) x 2.7 (cm) = 2.43 (cm?) ?Tissue and other material ?Viable, Non-Viable, Slough, Subcutaneous, Biofilm, Slough ?debrided: ?Level: Skin/Subcutaneous Tissue ?Debridement Description: Excisional ?Instrument: Curette ?Bleeding: Minimum ?Hemostasis Achieved: Pressure ?Response to Treatment: Procedure was tolerated well ?Level of Consciousness (Post- ?Awake and Alert ?procedure): ?Post Debridement Measurements of Total Wound ?Length: (cm) 0.9 ?Width: (cm)  2.7 ?Depth: (cm) 0.2 ?Volume: (cm?) 0.382 ?Character of Wound/Ulcer Post Debridement: Stable ?Severity of Tissue Post Debridement: Fat layer exposed ?Post Procedure Diagnosis ?Same as Pre-procedure ?Electronic Signature(s) ?Signed: 09/12/2021 3:21:13 PM By: Angelina Pih ?Signed: 09/12/2021 3:51:56 PM By: Lenda Kelp PA-C ?Entered By: Angelina Pih on 09/12/2021 09:07:54 ?[name], [name] ([id number]) ?-------------------------------------------------------------------------------- ?HPI Details ?Patient Name: [name], [name] ?Date of Service: 09/12/2021 9:00 AM ?Medical Record Number: [id number] ?Patient Account Number: [id number] ?Date of Birth/Sex: [birth year] (66 y.o. M) ?Treating RN: Angelina Pih ?Primary Care Provider: [name] Other Clinician: ?Referring Provider: [name] ?Treating Provider/Extender: Allen Derry ?Weeks in Treatment: 5 ?History of Present Illness ?HPI Description: 08/08/2021 upon evaluation today patient appears to be doing somewhat poorly in regard to a heel ulcer that began as a result ?of the patient tells me him trying to get his foot into his shoes that were somewhat tight. This was around 4 January. He was using a shoehorn ?fairly aggressively and thinks that he may have caused the bruising at that time. Fortunately there does not appear to be any signs of infection at ?this point. Nonetheless he does have some definite necrotic tissue. This can need to be cleared away. Nonetheless unfortunately his ABIs were ?noncompressible we do not have clearance in my opinion therefore to go forward with an aggressive sharp debridement. ?Patient does have diabetes mellitus type 2 with the most recent hemoglobin A1c of 5.2 and that was on June 2022. He also has a history of ?chronic venous insufficiency, peripheral vascular disease, and hypertension. Obviously with regard to the peripheral vascular disease we will ?continue to send him to vascular to quantify his ABIs and TBI's. ?08/22/2021 upon  evaluation today patient appears to be doing well with regard to his wound. This is definitely showing some signs of improvement ?which is great news and overall very pleased with where things stand today. There does not appear to be any evidence of active infection locally ?nor systemically which is great news and overall I feel like that this  is showing signs of improvement. It is definitely much less irritated as ?compared to last week. I am very pleased in that regard I am good to be able to get a lot of this dry skin off today as well which is great news. ?09/05/2021 upon evaluation today patient's heel actually showing signs of some improvement though there is still a bit of eschar noted in the ?center part of the wound. I do believe this is going require some sharp debridement to clear this away. The Iodosorb just is not quite cutting it. ?Fortunately there does not appear to be any signs of active infection at this time locally nor systemically. ?09/12/2021 upon evaluation today patient appears to be doing well with regard to his wound. He has been tolerating the dressing changes I do ?believe that clearing off the eschar last week has been to help there is still some necrotic tissue removed today overall he seems to be doing quite ?well however. ?Electronic Signature(s) ?Signed: 09/12/2021 9:34:45 AM By: Lenda Kelp PA-C ?Entered By: Lenda Kelp on 09/12/2021 09:34:45 ?[name], [name] ([id number]) ?-------------------------------------------------------------------------------- ?Physical Exam Details ?Patient Name: [name], [name] ?Date of Service: 09/12/2021 9:00 AM ?Medical Record Number: [id number] ?Patient Account Number: [id number] ?Date of Birth/Sex: [birth year] (66 y.o. M) ?Treating RN: Angelina Pih ?Primary Care Provider: [name] Other Clinician: ?Referring Provider: [name] ?Treating Provider/Extender: Allen Derry ?Weeks in Treatment: 5 ?Constitutional ?Well-nourished and well-hydrated in  no acute distress. ?Respiratory ?normal breathing without difficulty. ?Psychiatric ?this patient is able to make decisions and demonstrates good insight into disease process. Alert and Oriented x 3. pleasant and cooperative. ?Notes ?Upon inspection patient's wound bed showed evidence of good granulation and epithelization at this point. Fortunately I do not see any evidence ?of active infection locally or systemically which is great news I did perform sharp debridement clear away some of the necrotic debris tolerated ?that today without complication postdebridement wound bed appears to be doing much better. ?Electronic Signature(s) ?Signed: 09/12/2021 9:35:05 AM By: Lenda Kelp PA-C ?Entered By: Lenda Kelp on 09/12/2021 09:35:05 ?[name], [name] ([id number]) ?-------------------------------------------------------------------------------- ?Physician Orders Details ?Patient Name: [name], [name] ?Date of Service: 09/12/2021 9:00 AM ?Medical Record Number: [id number] ?Patient Account Number: [id number] ?Date of Birth/Sex: 1955-06-23 (66 y.o. 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 ?o Nurse Visit as needed ?Bathing/ Shower/ Hygiene ?o Wash wounds with antibacterial soap and water. ?o May shower; gently cleanse wound with antibacterial soap, rinse and pat dry prior to dressing wounds - shower with dressing ?on and remove after shower ?o No tub bath. ?Edema Control  - Lymphedema / Segmental Compressive Device / Other ?o Elevate, Exercise Daily and Avoid Standing for Walle Periods of Time. ?o Elevate leg(s) parallel to the floor when sitting. ?o DO YOUR BEST to s

## 2021-09-12 NOTE — Progress Notes (Signed)
Brahmbhatt, Labron W. ([id number]) ?Visit Report for 09/12/2021 ?Arrival Information Details ?Patient Name: [name] ?Date of Service: 09/12/2021 9:00 AM ?Medical Record Number: [id number] ?Patient Account Number: [id number] ?Date of Birth/Sex: [birth year] (66 y.o. M) ?Treating RN: Levora Dredge ?Primary Care Rama Sorci: Alysia Penna Other Clinician: ?Referring Vanecia Limpert: Alysia Penna ?Treating Aser Nylund/Extender: Jeri Cos ?Weeks in Treatment: 5 ?Visit Information History Since Last Visit ?Added or deleted any medications: No ?Patient Arrived: [name] ?Any new allergies or adverse reactions: No ?Arrival Time: 08:47 ?Had a fall or experienced change in No ?Accompanied By: self ?activities of daily living that may affect ?Transfer Assistance: None ?risk of falls: ?Patient Identification Verified: Yes ?Hospitalized since last visit: No ?Secondary Verification Process Completed: Yes ?Has Dressing in Place as Prescribed: Yes ?Patient Has Alerts: Yes ?Pain Present Now: No ?Patient Alerts: ABI R >1.0 [place] TBI 0.50 ?ABI L 0.98 ?Electronic Signature(s) ?Signed: 09/12/2021 3:21:13 PM By: Levora Dredge ?Entered By: Levora Dredge on 09/12/2021 08:50:43 ?[place], Danielle W. ([id number]) ?-------------------------------------------------------------------------------- ?Clinic Level of Care Assessment Details ?Patient Name: [name] ?Date of Service: 09/12/2021 9:00 AM ?Medical Record Number: [id number] ?Patient Account Number: [id number] ?Date of Birth/Sex: December 03, 1955 (66 y.o. M) ?Treating RN: Levora Dredge ?Primary Care Luciano Cinquemani: Alysia Penna Other Clinician: ?Referring Jamahl Lemmons: Alysia Penna ?Treating Brittne Kawasaki/Extender: Jeri Cos ?Weeks in Treatment: 5 ?Clinic Level of Care Assessment Items ?TOOL 1 Quantity Score ?[]  - Use when EandM and Procedure is performed on INITIAL visit 0 ?ASSESSMENTS - Nursing Assessment / Reassessment ?[]  - General Physical Exam (combine w/ comprehensive assessment (listed just below) when performed on  new ?0 ?pt. evals) ?[]  - 0 ?Comprehensive Assessment (HX, ROS, Risk Assessments, Wounds Hx, etc.) ?ASSESSMENTS - Wound and Skin Assessment / Reassessment ?[]  - Dermatologic / Skin Assessment (not related to wound area) 0 ?ASSESSMENTS - Ostomy and/or Continence Assessment and Care ?[]  - Incontinence Assessment and Management 0 ?[]  - 0 ?Ostomy Care Assessment and Management (repouching, etc.) ?PROCESS - Coordination of Care ?[]  - Simple Patient / Family Education for ongoing care 0 ?[]  - 0 ?Complex (extensive) Patient / Family Education for ongoing care ?[]  - 0 ?Staff obtains Consents, Records, Test Results / Process Orders ?[]  - 0 ?Staff telephones [place], Nursing Homes / Clarify orders / etc ?[]  - 0 ?Routine Transfer to another Facility (non-emergent condition) ?[]  - 0 ?Routine Hospital Admission (non-emergent condition) ?[]  - 0 ?New Admissions / Biomedical engineer / Ordering NPWT, Apligraf, etc. ?[]  - 0 ?Emergency Hospital Admission (emergent condition) ?PROCESS - Special Needs ?[]  - Pediatric / Minor Patient Management 0 ?[]  - 0 ?Isolation Patient Management ?[]  - 0 ?Hearing / Language / Visual special needs ?[]  - 0 ?Assessment of Community assistance (transportation, D/C planning, etc.) ?[]  - 0 ?Additional assistance / Altered mentation ?[]  - 0 ?Support Surface(s) Assessment (bed, cushion, seat, etc.) ?INTERVENTIONS - Miscellaneous ?[]  - External ear exam 0 ?[]  - 0 ?Patient Transfer (multiple staff / Civil Service fast streamer / Similar devices) ?[]  - 0 ?Simple Staple / Suture removal (25 or less) ?[]  - 0 ?Complex Staple / Suture removal (26 or more) ?[]  - 0 ?Hypo/Hyperglycemic Management (do not check if billed separately) ?[]  - 0 ?Ankle / Brachial Index (ABI) - do not check if billed separately ?Has the patient been seen at the hospital within the last three years: Yes ?Total Score: 0 ?Level Of Care: ____ ?[place], Laroy W. ([id number]) ?Electronic Signature(s) ?Signed: 09/12/2021 3:21:13 PM By: Levora Dredge ?Entered By:  Levora Dredge on 09/12/2021 09:16:28 ?Bradish, Jonus W. ([id number]) ?-------------------------------------------------------------------------------- ?Encounter Discharge Information  Details ?Patient Name: [name] ?Date of Service: 09/12/2021 9:00 AM ?Medical Record Number: [id number] ?Patient Account Number: [id number] ?Date of Birth/Sex: [birth year] (66 y.o. M) ?Treating RN: Levora Dredge ?Primary Care Rhealyn Cullen: Alysia Penna Other Clinician: ?Referring Oaklie Durrett: Alysia Penna ?Treating Stuart Guillen/Extender: Jeri Cos ?Weeks in Treatment: 5 ?Encounter Discharge Information Items Post Procedure Vitals ?Discharge Condition: Stable ?Temperature (?F): 98.3 ?Ambulatory Status: [name] ?Pulse (bpm): 84 ?Discharge Destination: Home ?Respiratory Rate (breaths/min): 18 ?Transportation: Private Auto ?Blood Pressure (mmHg): 165/82 ?Accompanied By: self ?Schedule Follow-up Appointment: Yes ?Clinical Summary of Care: ?Electronic Signature(s) ?Signed: 09/12/2021 3:21:13 PM By: Levora Dredge ?Entered By: Levora Dredge on 09/12/2021 09:17:39 ?[place], Abbott W. ([id number]) ?-------------------------------------------------------------------------------- ?Lower Extremity Assessment Details ?Patient Name: [name] ?Date of Service: 09/12/2021 9:00 AM ?Medical Record Number: [id number] ?Patient Account Number: [id number] ?Date of Birth/Sex: 29-Aug-1955 (66 y.o. M) ?Treating RN: Levora Dredge ?Primary Care Sinjin Amero: Alysia Penna Other Clinician: ?Referring Chanie Soucek: Alysia Penna ?Treating Tamotsu Wiederholt/Extender: Jeri Cos ?Weeks in Treatment: 5 ?Edema Assessment ?Assessed: [Left: Yes] [Right: No] ?Edema: [Left: Ye] [Right: s] ?Calf ?Left: Right: ?Point of Measurement: 32 cm From Medial Instep 35.8 cm ?Ankle ?Left: Right: ?Point of Measurement: 10 cm From Medial Instep 25.8 cm ?Vascular Assessment ?Pulses: ?Dorsalis Pedis ?Palpable: [Left:Yes] ?Electronic Signature(s) ?Signed: 09/12/2021 3:21:13 PM By: Levora Dredge ?Entered By: Levora Dredge on 09/12/2021 09:01:10 ?Wittke, Rony W. ([id number]) ?-------------------------------------------------------------------------------- ?Multi Wound Chart Details ?Patient Name: Laible, [name] ?Date of Service: 09/12/2021 9:00 AM ?Medical Record Number: [id number] ?Patient Account Number: [id number] ?Date of Birth/Sex: [birth year] (66 y.o. M) ?Treating RN: Levora Dredge ?Primary Care Riyanshi Wahab: Alysia Penna Other Clinician: ?Referring Vikrant Pryce: Alysia Penna ?Treating Thania Woodlief/Extender: Jeri Cos ?Weeks in Treatment: 5 ?Vital Signs ?Height(in): 68 ?Pulse(bpm): 84 ?Weight(lbs): 220 ?Blood Pressure(mmHg): 165/82 ?Body Mass Index(BMI): 33.4 ?Temperature(??F): 98.3 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 ?Comorbid History: Cataracts, Hypertension, Type II N/A N/A ?Diabetes, Neuropathy ?Date Acquired: 06/19/2021 N/A N/A ?Weeks of Treatment: 5 N/A N/A ?Wound Status: Open N/A N/A ?Wound Recurrence: No N/A N/A ?Measurements L x W x D (cm) 0.9x2.7x0.2 N/A N/A ?Area (cm?) : 1.909 N/A N/A ?Volume (cm?) : 0.382 N/A N/A ?% Reduction in Area: 15.90% N/A N/A ?% Reduction in Volume: -68.30% N/A N/A ?Classification: Grade 2 N/A N/A ?Exudate Amount: Medium N/A N/A ?Exudate Type: Serosanguineous N/A N/A ?Exudate Color: red, brown N/A N/A ?Granulation Amount: Small (1-33%) N/A N/A ?Granulation Quality: Pink, Pale N/A N/A ?Necrotic Amount: Large (67-100%) N/A N/A ?Exposed Structures: ?Fat Layer (Subcutaneous Tissue): N/A N/A ?Yes ?Epithelialization: Small (1-33%) N/A N/A ?Treatment Notes ?Electronic Signature(s) ?Signed: 09/12/2021 3:21:13 PM By: Levora Dredge ?Entered By: Levora Dredge on 09/12/2021 09:04:00 ?Metzger, Zale W. ([id number]) ?-------------------------------------------------------------------------------- ?Multi-Disciplinary Care Plan Details ?Patient Name:  Hurley, [name] ?Date of Service: 09/12/2021 9:00 AM ?Medical Record Number: [id number] ?Patient Account Number: [id number] ?Date of Birth/Sex: April 15, 1956 (66 y.o. M) ?Treating RN: Levora Dredge ?Primary Care [place]: Fr

## 2021-09-13 LAB — HM DIABETES EYE EXAM

## 2021-09-15 LAB — HM DIABETES EYE EXAM

## 2021-09-19 ENCOUNTER — Ambulatory Visit: Payer: No Typology Code available for payment source | Admitting: Physician Assistant

## 2021-09-20 ENCOUNTER — Encounter: Payer: No Typology Code available for payment source | Attending: Physician Assistant | Admitting: Physician Assistant

## 2021-09-20 DIAGNOSIS — E1151 Type 2 diabetes mellitus with diabetic peripheral angiopathy without gangrene: Secondary | ICD-10-CM | POA: Insufficient documentation

## 2021-09-20 DIAGNOSIS — L97422 Non-pressure chronic ulcer of left heel and midfoot with fat layer exposed: Secondary | ICD-10-CM | POA: Insufficient documentation

## 2021-09-20 DIAGNOSIS — I10 Essential (primary) hypertension: Secondary | ICD-10-CM | POA: Diagnosis not present

## 2021-09-20 DIAGNOSIS — I872 Venous insufficiency (chronic) (peripheral): Secondary | ICD-10-CM | POA: Diagnosis not present

## 2021-09-20 DIAGNOSIS — E11621 Type 2 diabetes mellitus with foot ulcer: Secondary | ICD-10-CM | POA: Insufficient documentation

## 2021-09-20 NOTE — Progress Notes (Signed)
[name], [name] ?Visit Report for 09/20/2021 ?Arrival Information Details ?Patient Name: [name], [name] ?Date of Service: 09/20/2021 9:00 AM ?Medical Record Number: [id number] ?Patient Account Number: [id number] ?Date of Birth/Sex: 11-26-1955 (66 y.o. M) ?Treating RN: [name] ?Primary Care [name]: [name]: ?Referring [name]: [name] ?Treating [name]/Extender: [name] ?Weeks in Treatment: 6 ?Visit Information History Since Last Visit ?Added or deleted any medications: No ?Patient Arrived: [name] ?Any new allergies or adverse reactions: No ?Arrival Time: 08:51 ?Had a fall or experienced change in No ?Accompanied By: self ?activities of daily living that may affect ?Transfer Assistance: None ?risk of falls: ?Patient Identification Verified: Yes ?Hospitalized since last visit: No ?Secondary Verification Process Completed: Yes ?Has Dressing in Place as Prescribed: Yes ?Patient Has Alerts: Yes ?Pain Present Now: No ?Patient Alerts: ABI R >1.0 Bascom TBI 0.50 ?ABI L 0.98 ?Electronic Signature(s) ?Signed: 09/20/2021 3:38:41 PM By: [name] ?Entered By: [name] on 09/20/2021 08:51:55 ?[name], [name] ?-------------------------------------------------------------------------------- ?Clinic Level of Care Assessment Details ?Patient Name: [name], [name] ?Date of Service: 09/20/2021 9:00 AM ?Medical Record Number: [id number] ?Patient Account Number: [id number] ?Date of Birth/Sex: [birth year] (66 y.o. M) ?Treating RN: [name] ?Primary Care Shelanda Duvall: [name]: ?Referring Phillis [name]: [name] ?Treating Addylynn Balin/Extender: [name] ?Weeks in Treatment: 6 ?Clinic Level of Care Assessment Items ?TOOL 1 Quantity Score ?[]  - Use when EandM and Procedure is performed on INITIAL visit 0 ?ASSESSMENTS - Nursing Assessment / Reassessment ?[]  - General Physical Exam (combine w/ comprehensive assessment (listed just below) when performed on new ?0 ?pt.  evals) ?[]  - 0 ?Comprehensive Assessment (HX, ROS, Risk Assessments, Wounds Hx, etc.) ?ASSESSMENTS - Wound and Skin Assessment / Reassessment ?[]  - Dermatologic / Skin Assessment (not related to wound area) 0 ?ASSESSMENTS - Ostomy and/or Continence Assessment and Care ?[]  - Incontinence Assessment and Management 0 ?[]  - 0 ?Ostomy Care Assessment and Management (repouching, etc.) ?PROCESS - Coordination of Care ?[]  - Simple Patient / Family Education for ongoing care 0 ?[]  - 0 ?Complex (extensive) Patient / Family Education for ongoing care ?[]  - 0 ?Staff obtains Consents, Records, Test Results / Process Orders ?[]  - 0 ?Staff telephones HHA, Nursing Homes / Clarify orders / etc ?[]  - 0 ?Routine Transfer to another Facility (non-emergent condition) ?[]  - 0 ?Routine Hospital Admission (non-emergent condition) ?[]  - 0 ?New Admissions / Biomedical engineer / Ordering NPWT, Apligraf, etc. ?[]  - 0 ?Emergency Hospital Admission (emergent condition) ?PROCESS - Special Needs ?[]  - Pediatric / Minor Patient Management 0 ?[]  - 0 ?Isolation Patient Management ?[]  - 0 ?Hearing / Language / Visual special needs ?[]  - 0 ?Assessment of Community assistance (transportation, D/C planning, etc.) ?[]  - 0 ?Additional assistance / Altered mentation ?[]  - 0 ?Support Surface(s) Assessment (bed, cushion, seat, etc.) ?INTERVENTIONS - Miscellaneous ?[]  - External ear exam 0 ?[]  - 0 ?Patient Transfer (multiple staff / Civil Service fast streamer / Similar devices) ?[]  - 0 ?Simple Staple / Suture removal (25 or less) ?[]  - 0 ?Complex Staple / Suture removal (26 or more) ?[]  - 0 ?Hypo/Hyperglycemic Management (do not check if billed separately) ?[]  - 0 ?Ankle / Brachial Index (ABI) - do not check if billed separately ?Has the patient been seen at the hospital within the last three years: Yes ?Total Score: 0 ?Level Of Care: ____ ?[name], [name] ?Electronic Signature(s) ?Signed: 09/20/2021 3:38:41 PM By: [name] ?Entered By: [name]  on 09/20/2021 09:42:06 ?[name], [name] ?-------------------------------------------------------------------------------- ?Encounter Discharge Information  Details ?Patient Name: [name] ?Date of Service: 09/20/2021 9:00 AM ?Medical Record Number: [id number] ?Patient Account Number: [id number] ?Date of Birth/Sex: Jan 10, 1956 (66 y.o. M) ?Treating RN: [name] ?Primary Care [name]: [name]: ?Referring [name]: [name] ?Treating [name]/Extender: [name] ?Weeks in Treatment: 6 ?Encounter Discharge Information Items Post Procedure Vitals ?Discharge Condition: Stable ?Temperature (?F): 97.6 ?Ambulatory Status: [name] ?Pulse (bpm): 75 ?Discharge Destination: Home ?Respiratory Rate (breaths/min): 18 ?Transportation: Private Auto ?Blood Pressure (mmHg): 175/99 ?Accompanied By: self ?Schedule Follow-up Appointment: No ?Clinical Summary of Care: ?Electronic Signature(s) ?Signed: 09/20/2021 3:38:41 PM By: [name] ?Entered By: [name] on 09/20/2021 09:43:21 ?[name], [name] ?-------------------------------------------------------------------------------- ?Lower Extremity Assessment Details ?Patient Name: [name], [name] ?Date of Service: 09/20/2021 9:00 AM ?Medical Record Number: [id number] ?Patient Account Number: [id number] ?Date of Birth/Sex: Feb 10, 1956 (66 y.o. M) ?Treating RN: [name] ?Primary Care [name]: [name]: ?Referring [name]: [name] ?Treating Gerald [name] ?Weeks in Treatment: 6 ?Edema Assessment ?Assessed: [Left: No] [Right: No] ?Edema: [Left: Ye] [Right: s] ?Calf ?Left: Right: ?Point of Measurement: 32 cm From Medial Instep 36.2 cm ?Ankle ?Left: Right: ?Point of Measurement: 10 cm From Medial Instep 26.2 cm ?Vascular Assessment ?Pulses: ?Dorsalis Pedis ?Palpable: [Left:Yes] ?Electronic Signature(s) ?Signed: 09/20/2021 3:38:41 PM By: [name] ?Entered By: [name] on 09/20/2021  09:02:08 ?[name], [name] ?-------------------------------------------------------------------------------- ?Multi Wound Chart Details ?Patient Name: [name], [name] ?Date of Service: 09/20/2021 9:00 AM ?Medical Record Number: [id number] ?Patient Account Number: [id number] ?Date of Birth/Sex: 11/24/1955 (66 y.o. 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 ?Comorbid History: Cataracts, Hypertension, Type II N/A N/A ?Diabetes, Neuropathy ?Date Acquired: 06/19/2021 N/A N/A ?Weeks of Treatment: 6 N/A N/A ?Wound Status: Open N/A N/A ?Wound Recurrence: No N/A N/A ?Measurements L x W x D (cm) 1.2x2.6x0.3 N/A N/A ?Area (cm?) : 2.45 N/A N/A ?Volume (cm?) : 0.735 N/A N/A ?% Reduction in Area: -7.90% N/A N/A ?% Reduction in Volume: -223.80% N/A N/A ?Classification: Grade 2 N/A N/A ?Exudate Amount: Medium N/A N/A ?Exudate Type: Serosanguineous N/A N/A ?Exudate Color: red, brown N/A N/A ?Granulation Amount: Small (1-33%) N/A N/A ?Granulation Quality: Pink, Pale N/A N/A ?Necrotic Amount: Large (67-100%) N/A N/A ?Exposed Structures: ?Fat Layer (Subcutaneous Tissue): N/A N/A ?Yes ?Epithelialization: Small (1-33%) N/A N/A ?Treatment Notes ?Electronic Signature(s) ?Signed: 09/20/2021 3:38:41 PM By: [name] ?Entered By: [name] on 09/20/2021 09:28:04 ?[name], [name] ?-------------------------------------------------------------------------------- ?Multi-Disciplinary Care Plan Details ?Patient Name: [name], [name] ?Date of  Service: 09/20/2021 9:00 AM ?Medical Record Number: [id number] ?Patient Account Number: [id number] ?Date of Birth/Sex: [birth year] (66 y.o. M) ?Treating RN: [name] ?Primary Care Suly Vukelich: Oretha Caprice

## 2021-09-20 NOTE — Progress Notes (Addendum)
Wheeler, Willliam W. ([id number]) ?Visit Report for 09/20/2021 ?Chief Complaint Document Details ?Patient Name: [name], [name] ?Date of Service: 09/20/2021 9:00 AM ?Medical Record Number: [id number] ?Patient Account Number: [id number] ?Date of Birth/Sex: Aug 15, 1955 (66 y.o. M) ?Treating RN: Angelina Pih ?Primary Care Provider: [name] Other Clinician: ?Referring Provider: [name] ?Treating Provider/Extender: Allen Derry ?Weeks in Treatment: 6 ?Information Obtained from: Patient ?Chief Complaint ?Left heel ulcer ?Electronic Signature(s) ?Signed: 09/20/2021 9:27:00 AM By: Lenda Kelp PA-C ?Entered By: Lenda Kelp on 09/20/2021 09:26:59 ?Michelle, Stark W. ([id number]) ?-------------------------------------------------------------------------------- ?Debridement Details ?Patient Name: [name], [name] ?Date of Service: 09/20/2021 9:00 AM ?Medical Record Number: [id number] ?Patient Account Number: [id number] ?Date of Birth/Sex: 11-10-1955 (66 y.o. M) ?Treating RN: Angelina Pih ?Primary Care Provider: [name] Other Clinician: ?Referring Provider: [name] ?Treating Provider/Extender: Allen Derry ?Weeks in Treatment: 6 ?Debridement Performed for ?Wound #1 Left Calcaneus ?Assessment: ?Performed By: Physician Nelida Meuse., PA-C ?Debridement Type: Debridement ?Severity of Tissue Pre Debridement: Fat layer exposed ?Level of Consciousness (Pre- ?Awake and Alert ?procedure): ?Pre-procedure Verification/Time Out ?Yes - 09:28 ?Taken: ?Total Area Debrided (L x W): 1.2 (cm) x 2.6 (cm) = 3.12 (cm?) ?Tissue and other material ?Viable, Non-Viable, Slough, Subcutaneous, Slough ?debrided: ?Level: Skin/Subcutaneous Tissue ?Debridement Description: Excisional ?Instrument: Curette ?Bleeding: Moderate ?Hemostasis Achieved: Pressure ?Response to Treatment: Procedure was tolerated well ?Level of Consciousness (Post- ?Awake and Alert ?procedure): ?Post Debridement Measurements of Total Wound ?Length: (cm) 1.2 ?Width: (cm) 2.6 ?Depth: (cm)  0.3 ?Volume: (cm?) 0.735 ?Character of Wound/Ulcer Post Debridement: Stable ?Severity of Tissue Post Debridement: Fat layer exposed ?Post Procedure Diagnosis ?Same as Pre-procedure ?Electronic Signature(s) ?Signed: 09/20/2021 3:38:41 PM By: Angelina Pih ?Signed: 09/23/2021 5:15:24 PM By: Lenda Kelp PA-C ?Entered By: Angelina Pih on 09/20/2021 09:33:06 ?[name], [name] ([id number]) ?-------------------------------------------------------------------------------- ?HPI Details ?Patient Name: [name], [name] ?Date of Service: 09/20/2021 9:00 AM ?Medical Record Number: [id number] ?Patient Account Number: [id number] ?Date of Birth/Sex: 14-Sep-1955 (66 y.o. M) ?Treating RN: Angelina Pih ?Primary Care Provider: [name] Other Clinician: ?Referring Provider: [name] ?Treating Provider/Extender: Allen Derry ?Weeks in Treatment: 6 ?History of Present Illness ?HPI Description: 08/08/2021 upon evaluation today patient appears to be doing somewhat poorly in regard to a heel ulcer that began as a result ?of the patient tells me him trying to get his foot into his shoes that were somewhat tight. This was around 4 January. He was using a shoehorn ?fairly aggressively and thinks that he may have caused the bruising at that time. Fortunately there does not appear to be any signs of infection at ?this point. Nonetheless he does have some definite necrotic tissue. This can need to be cleared away. Nonetheless unfortunately his ABIs were ?noncompressible we do not have clearance in my opinion therefore to go forward with an aggressive sharp debridement. ?Patient does have diabetes mellitus type 2 with the most recent hemoglobin A1c of 5.2 and that was on June 2022. He also has a history of ?chronic venous insufficiency, peripheral vascular disease, and hypertension. Obviously with regard to the peripheral vascular disease we will ?continue to send him to vascular to quantify his ABIs and TBI's. ?08/22/2021 upon evaluation today  patient appears to be doing well with regard to his wound. This is definitely showing some signs of improvement ?which is great news and overall very pleased with where things stand today. There does not appear to be any evidence of active infection locally ?nor systemically which is great news and overall I feel like that this is  showing signs of improvement. It is definitely much less irritated as ?compared to last week. I am very pleased in that regard I am good to be able to get a lot of this dry skin off today as well which is great news. ?09/05/2021 upon evaluation today patient's heel actually showing signs of some improvement though there is still a bit of eschar noted in the ?center part of the wound. I do believe this is going require some sharp debridement to clear this away. The Iodosorb just is not quite cutting it. ?Fortunately there does not appear to be any signs of active infection at this time locally nor systemically. ?09/12/2021 upon evaluation today patient appears to be doing well with regard to his wound. He has been tolerating the dressing changes I do ?believe that clearing off the eschar last week has been to help there is still some necrotic tissue removed today overall he seems to be doing quite ?well however. ?09/20/2021 upon evaluation today patient appears to be doing well with regard to his wound he does have some necrotic tissue noted on the ?surface of the wound but I do feel like we are getting better here definitely making good progress. I do not see any signs of infection which is ?great news and overall I think that we are on the right track. ?Electronic Signature(s) ?Signed: 09/20/2021 9:53:35 AM By: Lenda Kelp PA-C ?Previous Signature: 09/20/2021 9:51:12 AM Version By: Lenda Kelp PA-C ?Previous Signature: 09/20/2021 9:50:02 AM Version By: Lenda Kelp PA-C ?Entered By: Lenda Kelp on 09/20/2021 09:53:35 ?[name], [name]  ([id number]) ?-------------------------------------------------------------------------------- ?Physical Exam Details ?Patient Name: [name], [name] ?Date of Service: 09/20/2021 9:00 AM ?Medical Record Number: [id number] ?Patient Account Number: [id number] ?Date of Birth/Sex: 11-04-1955 (66 y.o. M) ?Treating RN: Angelina Pih ?Primary Care Provider: [name] Other Clinician: ?Referring Provider: [name] ?Treating Provider/Extender: Allen Derry ?Weeks in Treatment: 6 ?Constitutional ?Well-nourished and well-hydrated in no acute distress. ?Respiratory ?normal breathing without difficulty. ?Psychiatric ?this patient is able to make decisions and demonstrates good insight into disease process. Alert and Oriented x 3. pleasant and cooperative. ?Notes ?Patient's wound bed did require sharp debridement today which she tolerated without complication postdebridement wound bed appears to be ?doing much better which is great news. ?Electronic Signature(s) ?Signed: 09/20/2021 9:52:30 AM By: Lenda Kelp PA-C ?Previous Signature: 09/20/2021 9:50:37 AM Version By: Lenda Kelp PA-C ?Entered By: Lenda Kelp on 09/20/2021 09:52:29 ?[name], [name] ([id number]) ?-------------------------------------------------------------------------------- ?Physician Orders Details ?Patient Name: [name], [name] ?Date of Service: 09/20/2021 9:00 AM ?Medical Record Number: [id number] ?Patient Account Number: [id number] ?Date of Birth/Sex: [birth year] (66 y.o. 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 ?o Nurse Visit as needed ?Bathing/ Shower/ Hygiene ?o Wash wounds with antibacterial soap and water. ?o May shower; gently cleanse wound with antiba

## 2021-09-26 ENCOUNTER — Encounter: Payer: No Typology Code available for payment source | Admitting: Physician Assistant

## 2021-09-26 DIAGNOSIS — E11621 Type 2 diabetes mellitus with foot ulcer: Secondary | ICD-10-CM | POA: Diagnosis not present

## 2021-09-26 NOTE — Progress Notes (Signed)
Pitre, Arvel W. ([id number]) ?Visit Report for 09/26/2021 ?Arrival Information Details ?Patient Name: Bontrager, [name] ?Date of Service: 09/26/2021 9:45 AM ?Medical Record Number: [id number] ?Patient Account Number: [id number] ?Date of Birth/Sex: August 24, 1955 (66 y.o. M) ?Treating RN: Levora Dredge ?Primary Care Carmelia Tiner: Alysia Penna Other Clinician: ?Referring Deryck Hippler: Alysia Penna ?Treating Leaira Fullam/Extender: Jeri Cos ?Weeks in Treatment: 7 ?Visit Information History Since Last Visit ?Added or deleted any medications: No ?Patient Arrived: [name] ?Any new allergies or adverse reactions: No ?Arrival Time: 09:53 ?Had a fall or experienced change in No ?Accompanied By: self ?activities of daily living that may affect ?Transfer Assistance: None ?risk of falls: ?Patient Identification Verified: Yes ?Hospitalized since last visit: No ?Secondary Verification Process Completed: Yes ?Has Dressing in Place as Prescribed: Yes ?Patient Has Alerts: Yes ?Pain Present Now: No ?Patient Alerts: ABI R >1.0 [place] TBI 0.50 ?ABI L 0.98 ?Electronic Signature(s) ?Signed: 09/26/2021 4:36:08 PM By: Levora Dredge ?Entered By: Levora Dredge on 09/26/2021 09:57:21 ?Borba, Jahziel W. ([id number]) ?-------------------------------------------------------------------------------- ?Clinic Level of Care Assessment Details ?Patient Name: Vice, [name] ?Date of Service: 09/26/2021 9:45 AM ?Medical Record Number: [id number] ?Patient Account Number: [id number] ?Date of Birth/Sex: 12-Feb-1956 (66 y.o. M) ?Treating RN: Levora Dredge ?Primary Care Cadince Hilscher: Alysia Penna Other Clinician: ?Referring Sefora Tietje: Alysia Penna ?Treating Velvia Mehrer/Extender: Jeri Cos ?Weeks in Treatment: 7 ?Clinic Level of Care Assessment Items ?TOOL 1 Quantity Score ?[]  - Use when EandM and Procedure is performed on INITIAL visit 0 ?ASSESSMENTS - Nursing Assessment / Reassessment ?[]  - General Physical Exam (combine w/ comprehensive assessment (listed just below) when performed on  new ?0 ?pt. evals) ?[]  - 0 ?Comprehensive Assessment (HX, ROS, Risk Assessments, Wounds Hx, etc.) ?ASSESSMENTS - Wound and Skin Assessment / Reassessment ?[]  - Dermatologic / Skin Assessment (not related to wound area) 0 ?ASSESSMENTS - Ostomy and/or Continence Assessment and Care ?[]  - Incontinence Assessment and Management 0 ?[]  - 0 ?Ostomy Care Assessment and Management (repouching, etc.) ?PROCESS - Coordination of Care ?[]  - Simple Patient / Family Education for ongoing care 0 ?[]  - 0 ?Complex (extensive) Patient / Family Education for ongoing care ?[]  - 0 ?Staff obtains Consents, Records, Test Results / Process Orders ?[]  - 0 ?Staff telephones HHA, Nursing Homes / Clarify orders / etc ?[]  - 0 ?Routine Transfer to another Facility (non-emergent condition) ?[]  - 0 ?Routine Hospital Admission (non-emergent condition) ?[]  - 0 ?New Admissions / Biomedical engineer / Ordering NPWT, Apligraf, etc. ?[]  - 0 ?Emergency Hospital Admission (emergent condition) ?PROCESS - Special Needs ?[]  - Pediatric / Minor Patient Management 0 ?[]  - 0 ?Isolation Patient Management ?[]  - 0 ?Hearing / Language / Visual special needs ?[]  - 0 ?Assessment of Community assistance (transportation, D/C planning, etc.) ?[]  - 0 ?Additional assistance / Altered mentation ?[]  - 0 ?Support Surface(s) Assessment (bed, cushion, seat, etc.) ?INTERVENTIONS - Miscellaneous ?[]  - External ear exam 0 ?[]  - 0 ?Patient Transfer (multiple staff / Civil Service fast streamer / Similar devices) ?[]  - 0 ?Simple Staple / Suture removal (25 or less) ?[]  - 0 ?Complex Staple / Suture removal (26 or more) ?[]  - 0 ?Hypo/Hyperglycemic Management (do not check if billed separately) ?[]  - 0 ?Ankle / Brachial Index (ABI) - do not check if billed separately ?Has the patient been seen at the hospital within the last three years: Yes ?Total Score: 0 ?Level Of Care: ____ ?Garfield, Aram W. ([id number]) ?Electronic Signature(s) ?Signed: 09/26/2021 4:36:08 PM By: Levora Dredge ?Entered By:  Levora Dredge on 09/26/2021 10:33:42 ?[name], [name] ([id number]) ?-------------------------------------------------------------------------------- ?Encounter Discharge Information  Details ?Patient Name: [name] ?Date of Service: 09/26/2021 9:45 AM ?Medical Record Number: [id number] ?Patient Account Number: [id number] ?Date of Birth/Sex: [birth year] (66 y.o. M) ?Treating RN: Levora Dredge ?Primary Care Anda Sobotta: Alysia Penna Other Clinician: ?Referring Sachiko Methot: Alysia Penna ?Treating Emile Ringgenberg/Extender: Jeri Cos ?Weeks in Treatment: 7 ?Encounter Discharge Information Items Post Procedure Vitals ?Discharge Condition: Stable ?Temperature (?F): 97.5 ?Ambulatory Status: [name] ?Pulse (bpm): 73 ?Discharge Destination: Home ?Respiratory Rate (breaths/min): 18 ?Transportation: Private Auto ?Blood Pressure (mmHg): 152/81 ?Accompanied By: self ?Schedule Follow-up Appointment: Yes ?Clinical Summary of Care: Patient Declined ?Electronic Signature(s) ?Signed: 09/26/2021 4:36:08 PM By: Levora Dredge ?Entered By: Levora Dredge on 09/26/2021 10:34:46 ?Decoteau, Delmore W. ([id number]) ?-------------------------------------------------------------------------------- ?Lower Extremity Assessment Details ?Patient Name: [name] ?Date of Service: 09/26/2021 9:45 AM ?Medical Record Number: [id number] ?Patient Account Number: [id number] ?Date of Birth/Sex: October 14, 1955 (66 y.o. M) ?Treating RN: Levora Dredge ?Primary Care Hau Sanor: Alysia Penna Other Clinician: ?Referring Ryleeann Urquiza: Alysia Penna ?Treating Vladislav Axelson/Extender: Jeri Cos ?Weeks in Treatment: 7 ?Edema Assessment ?Assessed: [Left: No] [Right: No] ?Edema: [Left: Ye] [Right: s] ?Calf ?Left: Right: ?Point of Measurement: 32 cm From Medial Instep 38 cm ?Ankle ?Left: Right: ?Point of Measurement: 10 cm From Medial Instep 26 cm ?Vascular Assessment ?Pulses: ?Dorsalis Pedis ?Palpable: [Left:Yes] ?Electronic Signature(s) ?Signed: 09/26/2021 4:36:08 PM By: Levora Dredge ?Entered  By: Levora Dredge on 09/26/2021 10:08:42 ?Garson, Armon W. ([id number]) ?-------------------------------------------------------------------------------- ?Multi Wound Chart Details ?Patient Name: Welford, [name] ?Date of Service: 09/26/2021 9:45 AM ?Medical Record Number: [id number] ?Patient Account Number: [id number] ?Date of Birth/Sex: 1955/12/20 (66 y.o. 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 ?Comorbid History: Cataracts, Hypertension, Type II N/A N/A ?Diabetes, Neuropathy ?Date Acquired: 06/19/2021 N/A N/A ?Weeks of Treatment: 7 N/A N/A ?Wound Status: Open N/A N/A ?Wound Recurrence: No N/A N/A ?Measurements L x W x D (cm) 1.2x2.2x0.2 N/A N/A ?Area (cm?) : 2.073 N/A N/A ?Volume (cm?) : 0.415 N/A N/A ?% Reduction in Area: 8.70% N/A N/A ?% Reduction in Volume: -82.80% N/A N/A ?Classification: Grade 2 N/A N/A ?Exudate Amount: Medium N/A N/A ?Exudate Type: Serosanguineous N/A N/A ?Exudate Color: red, brown N/A N/A ?Granulation Amount: Medium (34-66%) N/A N/A ?Granulation Quality: Pink, Pale N/A N/A ?Necrotic Amount: Medium (34-66%) N/A N/A ?Exposed Structures: ?Fat Layer (Subcutaneous Tissue): N/A N/A ?Yes ?Epithelialization: Small (1-33%) N/A N/A ?Treatment Notes ?Electronic Signature(s) ?Signed: 09/26/2021 4:36:08 PM By: Levora Dredge ?Entered By: Levora Dredge on 09/26/2021 10:09:16 ?Feuerstein, Cleave W. ([id number]) ?-------------------------------------------------------------------------------- ?Multi-Disciplinary Care Plan  Details ?Patient Name: Tyson, [name] ?Date of Service: 09/26/2021 9:45 AM ?Medical Record Number: [id number] ?Patient Account Number: [id number] ?Date of Birth/Sex: 11-Dec-1955 (66 y.o. M) ?Treating RN: Levora Dredge ?Primary Care

## 2021-09-26 NOTE — Progress Notes (Addendum)
Marchio, Tamarion W. ([id number]) ?Visit Report for 09/26/2021 ?Chief Complaint Document Details ?Patient Name: [name], [name] ?Date of Service: 09/26/2021 9:45 AM ?Medical Record Number: [id number] ?Patient Account Number: [id number] ?Date of Birth/Sex: [birth year] (66 y.o. M) ?Treating RN: Angelina Pih ?Primary Care Provider: [name] Other Clinician: ?Referring Provider: [name] ?Treating Provider/Extender: Allen Derry ?Weeks in Treatment: 7 ?Information Obtained from: Patient ?Chief Complaint ?Left heel ulcer ?Electronic Signature(s) ?Signed: 09/26/2021 10:17:52 AM By: Lenda Kelp PA-C ?Entered By: Lenda Kelp on 09/26/2021 10:17:51 ?[name], [name] ([id number]) ?-------------------------------------------------------------------------------- ?Debridement Details ?Patient Name: [name], [name] ?Date of Service: 09/26/2021 9:45 AM ?Medical Record Number: [id number] ?Patient Account Number: [id number] ?Date of Birth/Sex: [birth year] (66 y.o. M) ?Treating RN: Angelina Pih ?Primary Care Provider: [name] Other Clinician: ?Referring Provider: [name] ?Treating Provider/Extender: Allen Derry ?Weeks in Treatment: 7 ?Debridement Performed for ?Wound #1 Left Calcaneus ?Assessment: ?Performed By: Physician Nelida Meuse., PA-C ?Debridement Type: Debridement ?Severity of Tissue Pre Debridement: Fat layer exposed ?Level of Consciousness (Pre- ?Awake and Alert ?procedure): ?Pre-procedure Verification/Time Out ?Yes - 10:23 ?Taken: ?Total Area Debrided (L x W): 1.2 (cm) x 2.2 (cm) = 2.64 (cm?) ?Tissue and other material ?Viable, Non-Viable, Slough, Subcutaneous, Slough ?debrided: ?Level: Skin/Subcutaneous Tissue ?Debridement Description: Excisional ?Instrument: Curette ?Bleeding: Moderate ?Hemostasis Achieved: Pressure ?Response to Treatment: Procedure was tolerated well ?Level of Consciousness (Post- ?Awake and Alert ?procedure): ?Post Debridement Measurements of Total Wound ?Length: (cm) 1.2 ?Width: (cm) 2.2 ?Depth:  (cm) 0.2 ?Volume: (cm?) 0.415 ?Character of Wound/Ulcer Post Debridement: Stable ?Severity of Tissue Post Debridement: Fat layer exposed ?Post Procedure Diagnosis ?Same as Pre-procedure ?Electronic Signature(s) ?Signed: 09/26/2021 4:36:08 PM By: Angelina Pih ?Signed: 09/26/2021 4:48:03 PM By: Lenda Kelp PA-C ?Entered By: Angelina Pih on 09/26/2021 10:26:00 ?[name], [name] ([id number]) ?-------------------------------------------------------------------------------- ?HPI Details ?Patient Name: [name], [name] ?Date of Service: 09/26/2021 9:45 AM ?Medical Record Number: [id number] ?Patient Account Number: [id number] ?Date of Birth/Sex: 08/17/1955 (66 y.o. M) ?Treating RN: Angelina Pih ?Primary Care Provider: [name] Other Clinician: ?Referring Provider: [name] ?Treating Provider/Extender: Allen Derry ?Weeks in Treatment: 7 ?History of Present Illness ?HPI Description: 08/08/2021 upon evaluation today patient appears to be doing somewhat poorly in regard to a heel ulcer that began as a result ?of the patient tells me him trying to get his foot into his shoes that were somewhat tight. This was around 4 January. He was using a shoehorn ?fairly aggressively and thinks that he may have caused the bruising at that time. Fortunately there does not appear to be any signs of infection at ?this point. Nonetheless he does have some definite necrotic tissue. This can need to be cleared away. Nonetheless unfortunately his ABIs were ?noncompressible we do not have clearance in my opinion therefore to go forward with an aggressive sharp debridement. ?Patient does have diabetes mellitus type 2 with the most recent hemoglobin A1c of 5.2 and that was on June 2022. He also has a history of ?chronic venous insufficiency, peripheral vascular disease, and hypertension. Obviously with regard to the peripheral vascular disease we will ?continue to send him to vascular to quantify his ABIs and TBI's. ?08/22/2021 upon evaluation  today patient appears to be doing well with regard to his wound. This is definitely showing some signs of improvement ?which is great news and overall very pleased with where things stand today. There does not appear to be any evidence of active infection locally ?nor systemically which is great news and overall I feel like that this is  showing signs of improvement. It is definitely much less irritated as ?compared to last week. I am very pleased in that regard I am good to be able to get a lot of this dry skin off today as well which is great news. ?09/05/2021 upon evaluation today patient's heel actually showing signs of some improvement though there is still a bit of eschar noted in the ?center part of the wound. I do believe this is going require some sharp debridement to clear this away. The Iodosorb just is not quite cutting it. ?Fortunately there does not appear to be any signs of active infection at this time locally nor systemically. ?09/12/2021 upon evaluation today patient appears to be doing well with regard to his wound. He has been tolerating the dressing changes I do ?believe that clearing off the eschar last week has been to help there is still some necrotic tissue removed today overall he seems to be doing quite ?well however. ?09/20/2021 upon evaluation today patient appears to be doing well with regard to his wound he does have some necrotic tissue noted on the ?surface of the wound but I do feel like we are getting better here definitely making good progress. I do not see any signs of infection which is ?great news and overall I think that we are on the right track. ?09-26-2021 upon evaluation today patient appears to be doing well with regard to his heel ulcer. This is actually measuring smaller and looking ?better today. Fortunately I do not see any evidence of active infection locally or systemically which is great news. ?Electronic Signature(s) ?Signed: 09/26/2021 11:53:57 AM By: Lenda Kelp  PA-C ?Entered By: Lenda Kelp on 09/26/2021 11:53:57 ?[name], [name] ([id number]) ?-------------------------------------------------------------------------------- ?Physical Exam Details ?Patient Name: [name], [name] ?Date of Service: 09/26/2021 9:45 AM ?Medical Record Number: [id number] ?Patient Account Number: [id number] ?Date of Birth/Sex: June 15, 1956 (66 y.o. M) ?Treating RN: Angelina Pih ?Primary Care Provider: [name] Other Clinician: ?Referring Provider: [name] ?Treating Provider/Extender: Allen Derry ?Weeks in Treatment: 7 ?Constitutional ?Well-nourished and well-hydrated in no acute distress. ?Respiratory ?normal breathing without difficulty. ?Psychiatric ?this patient is able to make decisions and demonstrates good insight into disease process. Alert and Oriented x 3. pleasant and cooperative. ?Notes ?Patient's wound does show signs of significant improvement which is great news and overall I am extremely pleased with where we stand today. I ?do not see any evidence of active infection locally nor systemically which is great. ?Electronic Signature(s) ?Signed: 09/26/2021 11:54:30 AM By: Lenda Kelp PA-C ?Entered By: Lenda Kelp on 09/26/2021 11:54:30 ?[name], [name] ([id number]) ?-------------------------------------------------------------------------------- ?Physician Orders Details ?Patient Name: [name], [name] ?Date of Service: 09/26/2021 9:45 AM ?Medical Record Number: [id number] ?Patient Account Number: [id number] ?Date of Birth/Sex: October 02, 1955 (67 y.o. 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 ?o Nurse Visit as needed ?Bathing/ Shower/ Hyg

## 2021-10-03 ENCOUNTER — Encounter: Payer: No Typology Code available for payment source | Admitting: Physician Assistant

## 2021-10-03 DIAGNOSIS — E11621 Type 2 diabetes mellitus with foot ulcer: Secondary | ICD-10-CM | POA: Diagnosis not present

## 2021-10-03 NOTE — Progress Notes (Signed)
[name], [name] ([id number]) ?Visit Report for 10/03/2021 ?Arrival Information Details ?Patient Name: [name], [name] ?Date of Service: 10/03/2021 9:45 AM ?Medical Record Number: [id number] ?Patient Account Number: [id number] ?Date of Birth/Sex: August 23, 1955 (66 y.o. M) ?Treating RN: [name] ?Primary Care [name]: [name] Other Clinician: ?Referring [name]: [name] ?Treating [name]/Extender: [name] ?Weeks in Treatment: 8 ?Visit Information History Since Last Visit ?Added or deleted any medications: No ?Patient Arrived: [name] ?Any new allergies or adverse reactions: No ?Arrival Time: 09:37 ?Had a fall or experienced change in No ?Accompanied By: self ?activities of daily living that may affect ?Transfer Assistance: None ?risk of falls: ?Patient Identification Verified: Yes ?Hospitalized since last visit: No ?Secondary Verification Process Completed: Yes ?Has Dressing in Place as Prescribed: Yes ?Patient Has Alerts: Yes ?Pain Present Now: Yes ?Patient Alerts: ABI R >1.0 [place] TBI 0.50 ?ABI L 0.98 ?Electronic Signature(s) ?Signed: 10/03/2021 4:49:52 PM By: [name] ?Entered By: [name] on 10/03/2021 09:37:49 ?[name], [name] ([id number]) ?-------------------------------------------------------------------------------- ?Clinic Level of Care Assessment Details ?Patient Name: [name], [name] ?Date of Service: 10/03/2021 9:45 AM ?Medical Record Number: [id number] ?Patient Account Number: [id number] ?Date of Birth/Sex: 1955-08-24 (66 y.o. M) ?Treating RN: [name] ?Primary Care [name]: [name] Other Clinician: ?Referring [name]: [name] ?Treating [name]/Extender: [name] ?Weeks in Treatment: 8 ?Clinic Level of Care Assessment Items ?TOOL 1 Quantity Score ?[]  - Use when EandM and Procedure is performed on INITIAL visit 0 ?ASSESSMENTS - Nursing Assessment / Reassessment ?[]  - General Physical Exam (combine w/ comprehensive assessment (listed just below) when performed on  new ?0 ?pt. evals) ?[]  - 0 ?Comprehensive Assessment (HX, ROS, Risk Assessments, Wounds Hx, etc.) ?ASSESSMENTS - Wound and Skin Assessment / Reassessment ?[]  - Dermatologic / Skin Assessment (not related to wound area) 0 ?ASSESSMENTS - Ostomy and/or Continence Assessment and Care ?[]  - Incontinence Assessment and Management 0 ?[]  - 0 ?Ostomy Care Assessment and Management (repouching, etc.) ?PROCESS - Coordination of Care ?[]  - Simple Patient / Family Education for ongoing care 0 ?[]  - 0 ?Complex (extensive) Patient / Family Education for ongoing care ?[]  - 0 ?Staff obtains Consents, Records, Test Results / Process Orders ?[]  - 0 ?Staff telephones HHA, Nursing Homes / Clarify orders / etc ?[]  - 0 ?Routine Transfer to another Facility (non-emergent condition) ?[]  - 0 ?Routine Hospital Admission (non-emergent condition) ?[]  - 0 ?New Admissions / Biomedical engineer / Ordering NPWT, Apligraf, etc. ?[]  - 0 ?Emergency Hospital Admission (emergent condition) ?PROCESS - Special Needs ?[]  - Pediatric / Minor Patient Management 0 ?[]  - 0 ?Isolation Patient Management ?[]  - 0 ?Hearing / Language / Visual special needs ?[]  - 0 ?Assessment of Community assistance (transportation, D/C planning, etc.) ?[]  - 0 ?Additional assistance / Altered mentation ?[]  - 0 ?Support Surface(s) Assessment (bed, cushion, seat, etc.) ?INTERVENTIONS - Miscellaneous ?[]  - External ear exam 0 ?[]  - 0 ?Patient Transfer (multiple staff / Civil Service fast streamer / Similar devices) ?[]  - 0 ?Simple Staple / Suture removal (25 or less) ?[]  - 0 ?Complex Staple / Suture removal (26 or more) ?[]  - 0 ?Hypo/Hyperglycemic Management (do not check if billed separately) ?[]  - 0 ?Ankle / Brachial Index (ABI) - do not check if billed separately ?Has the patient been seen at the hospital within the last three years: Yes ?Total Score: 0 ?Level Of Care: ____ ?[name], [name] ([id number]) ?Electronic Signature(s) ?Signed: 10/03/2021 4:49:52 PM By: [name] ?Entered By:  [name] on 10/03/2021 10:15:45 ?[name], [name] ([id number]) ?-------------------------------------------------------------------------------- ?Encounter Discharge Information  Details ?Patient Name: [name], [name] ?Date of Service: 10/03/2021 9:45 AM ?Medical Record Number: [name] ?Patient Account Number: [id number] ?Date of Birth/Sex: 1955-12-01 (66 y.o. M) ?Treating RN: [name] ?Primary Care [name]: [name] Other Clinician: ?Referring Inez Rosato: [name] ?Treating [name]/Extender: [name] ?Weeks in Treatment: 8 ?Encounter Discharge Information Items Post Procedure Vitals ?Discharge Condition: Stable ?Temperature (?F): 97.6 ?Ambulatory Status: [name] ?Pulse (bpm): 76 ?Discharge Destination: Home ?Respiratory Rate (breaths/min): 18 ?Transportation: Private Auto ?Blood Pressure (mmHg): 157/71 ?Accompanied By: wife ?Schedule Follow-up Appointment: Yes ?Clinical Summary of Care: Patient Declined ?Electronic Signature(s) ?Signed: 10/03/2021 4:49:52 PM By: [name] ?Entered By: [name] on 10/03/2021 10:17:15 ?[name], [name] ([name]) ?-------------------------------------------------------------------------------- ?Lower Extremity Assessment Details ?Patient Name: [name], [name] ?Date of Service: 10/03/2021 9:45 AM ?Medical Record Number: [name] ?Patient Account Number: [id number] ?Date of Birth/Sex: 09-07-1955 (66 y.o. M) ?Treating RN: [name] ?Primary Care Naszir Cott: [name] Other Clinician: ?Referring [name]: [name] ?Treating [name]/Extender: [name] ?Weeks in Treatment: 8 ?Edema Assessment ?Assessed: [Left: No] [Right: No] ?Edema: [Left: Ye] [Right: s] ?Calf ?Left: Right: ?Point of Measurement: 32 cm From Medial Instep 38 cm ?Ankle ?Left: Right: ?Point of Measurement: 10 cm From Medial Instep 27 cm ?Vascular Assessment ?Pulses: ?Dorsalis Pedis ?Palpable: [Left:Yes] ?Electronic Signature(s) ?Signed: 10/03/2021 4:49:52 PM By: [name] ?Entered  By: [name] on 10/03/2021 09:50:41 ?[name], [name] ([name]) ?-------------------------------------------------------------------------------- ?Multi Wound Chart Details ?Patient Name: [name], [name] ?Date of Service: 10/03/2021 9:45 AM ?Medical Record Number: [name] ?Patient Account Number: [id number] ?Date of Birth/Sex: [birth year] (67 y.o. M) ?Treating RN: [name] ?Primary Care Kyriaki Moder: [name] Other Clinician: ?Referring Dametrius Sanjuan: [name] ?Treating Taleyah Hillman/Extender: [name] ?Weeks in Treatment: 8 ?Vital Signs ?Height(in): 68 ?Pulse(bpm): 76 ?Weight(lbs): 220 ?Blood Pressure(mmHg): 157/71 ?Body Mass Index(BMI): 33.4 ?Temperature(??F): 97. 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 ?Comorbid History: Cataracts, Hypertension, Type II N/A N/A ?Diabetes, Neuropathy ?Date Acquired: 06/19/2021 N/A N/A ?Weeks of Treatment: 8 N/A N/A ?Wound Status: Open N/A N/A ?Wound Recurrence: No N/A N/A ?Measurements L x W x D (cm) 1.3x2.1x0.3 N/A N/A ?Area (cm?) : 2.144 N/A N/A ?Volume (cm?) : 0.643 N/A N/A ?% Reduction in Area: 5.60% N/A N/A ?% Reduction in Volume: -183.30% N/A N/A ?Classification: Grade 2 N/A N/A ?Exudate Amount: Medium N/A N/A ?Exudate Type: Serosanguineous N/A N/A ?Exudate Color: red, brown N/A N/A ?Granulation Amount: Medium (34-66%) N/A N/A ?Granulation Quality: Pink, Pale N/A N/A ?Necrotic Amount: Medium (34-66%) N/A N/A ?Exposed Structures: ?Fat Layer (Subcutaneous Tissue): N/A N/A ?Yes ?Epithelialization: Small (1-33%) N/A N/A ?Treatment Notes ?Electronic Signature(s) ?Signed: 10/03/2021 4:49:52 PM By: [name] ?Entered By: [name] on 10/03/2021 10:06:27 ?[name], [name] ([name]) ?-------------------------------------------------------------------------------- ?Multi-Disciplinary Care Plan  Details ?Patient Name: [name], [name] ?Date of Service: 10/03/2021 9:45 AM ?Medical Record Number: [name] ?Patient Account Number: [id number] ?Date of Birth/Sex: April 13, 1956 (65 y.o. M) ?Treating RN: [name] ?Primary Car

## 2021-10-03 NOTE — Progress Notes (Addendum)
Fritsche, Andrick W. ([id number]) ?Visit Report for 10/03/2021 ?Chief Complaint Document Details ?Patient Name: Orbach, Gerald ClimesAVID W. ?Date of Service: 10/03/2021 9:45 AM ?Medical Record Number: [id number] ?Patient Account Number: [id number] ?Date of Birth/Sex: 12/09/1955 (66 y.o. M) ?Treating RN: Angelina PihGordon, Caitlin ?Primary Care Provider: Gershon CraneFry, Stephen Other Clinician: ?Referring Provider: Gershon CraneFry, Stephen ?Treating Provider/Extender: Allen DerryStone, Siddiq Kaluzny ?Weeks in Treatment: 8 ?Information Obtained from: Patient ?Chief Complaint ?Left heel ulcer ?Electronic Signature(s) ?Signed: 10/03/2021 10:03:49 AM By: Lenda KelpStone III, Jaedyn Lard PA-C ?Entered By: Lenda KelpStone III, Tobyn Osgood on 10/03/2021 10:03:49 ?Weil, Octavis W. ([id number]) ?-------------------------------------------------------------------------------- ?Debridement Details ?Patient Name: Trickel, Gerald ClimesAVID W. ?Date of Service: 10/03/2021 9:45 AM ?Medical Record Number: [id number] ?Patient Account Number: [id number] ?Date of Birth/Sex: 01/11/1956 (66 y.o. M) ?Treating RN: Angelina PihGordon, Caitlin ?Primary Care Provider: Gershon CraneFry, Stephen Other Clinician: ?Referring Provider: Gershon CraneFry, Stephen ?Treating Provider/Extender: Allen DerryStone, Pretty Weltman ?Weeks in Treatment: 8 ?Debridement Performed for ?Wound #1 Left Calcaneus ?Assessment: ?Performed By: Physician Nelida MeuseStone, Gracelee Stemmler E., PA-C ?Debridement Type: Debridement ?Severity of Tissue Pre Debridement: Fat layer exposed ?Level of Consciousness (Pre- ?Awake and Alert ?procedure): ?Pre-procedure Verification/Time Out ?Yes - 10:09 ?Taken: ?Total Area Debrided (L x W): 1.3 (cm) x 2.1 (cm) = 2.73 (cm?) ?Tissue and other material ?Viable, Non-Viable, Slough, Subcutaneous, Slough ?debrided: ?Level: Skin/Subcutaneous Tissue ?Debridement Description: Excisional ?Instrument: Curette ?Bleeding: Moderate ?Hemostasis Achieved: Pressure ?Response to Treatment: Procedure was tolerated well ?Level of Consciousness (Post- ?Awake and Alert ?procedure): ?Post Debridement Measurements of Total Wound ?Length: (cm) 1.3 ?Width: (cm) 2.1 ?Depth:  (cm) 0.3 ?Volume: (cm?) 0.643 ?Character of Wound/Ulcer Post Debridement: Stable ?Severity of Tissue Post Debridement: Fat layer exposed ?Post Procedure Diagnosis ?Same as Pre-procedure ?Electronic Signature(s) ?Signed: 10/03/2021 4:40:44 PM By: Lenda KelpStone III, Sevana Grandinetti PA-C ?Signed: 10/03/2021 4:49:52 PM By: Angelina PihGordon, Caitlin ?Entered By: Angelina PihGordon, Caitlin on 10/03/2021 10:10:56 ?Pepper, Daegan W. ([id number]) ?-------------------------------------------------------------------------------- ?HPI Details ?Patient Name: Mcinerney, Gerald ClimesAVID W. ?Date of Service: 10/03/2021 9:45 AM ?Medical Record Number: [id number] ?Patient Account Number: [id number] ?Date of Birth/Sex: 04/01/1956 (66 y.o. M) ?Treating RN: Angelina PihGordon, Caitlin ?Primary Care Provider: Gershon CraneFry, Stephen Other Clinician: ?Referring Provider: Gershon CraneFry, Stephen ?Treating Provider/Extender: Allen DerryStone, Nakeitha Milligan ?Weeks in Treatment: 8 ?History of Present Illness ?HPI Description: 08/08/2021 upon evaluation today patient appears to be doing somewhat poorly in regard to a heel ulcer that began as a result ?of the patient tells me him trying to get his foot into his shoes that were somewhat tight. This was around 4 January. He was using a shoehorn ?fairly aggressively and thinks that he may have caused the bruising at that time. Fortunately there does not appear to be any signs of infection at ?this point. Nonetheless he does have some definite necrotic tissue. This can need to be cleared away. Nonetheless unfortunately his ABIs were ?noncompressible we do not have clearance in my opinion therefore to go forward with an aggressive sharp debridement. ?Patient does have diabetes mellitus type 2 with the most recent hemoglobin A1c of 5.2 and that was on June 2022. He also has a history of ?chronic venous insufficiency, peripheral vascular disease, and hypertension. Obviously with regard to the peripheral vascular disease we will ?continue to send him to vascular to quantify his ABIs and TBI's. ?08/22/2021 upon evaluation  today patient appears to be doing well with regard to his wound. This is definitely showing some signs of improvement ?which is great news and overall very pleased with where things stand today. There does not appear to be any evidence of active infection locally ?nor systemically which is great news and overall I feel like that this is  showing signs of improvement. It is definitely much less irritated as ?compared to last week. I am very pleased in that regard I am good to be able to get a lot of this dry skin off today as well which is great news. ?09/05/2021 upon evaluation today patient's heel actually showing signs of some improvement though there is still a bit of eschar noted in the ?center part of the wound. I do believe this is going require some sharp debridement to clear this away. The Iodosorb just is not quite cutting it. ?Fortunately there does not appear to be any signs of active infection at this time locally nor systemically. ?09/12/2021 upon evaluation today patient appears to be doing well with regard to his wound. He has been tolerating the dressing changes I do ?believe that clearing off the eschar last week has been to help there is still some necrotic tissue removed today overall he seems to be doing quite ?well however. ?09/20/2021 upon evaluation today patient appears to be doing well with regard to his wound he does have some necrotic tissue noted on the ?surface of the wound but I do feel like we are getting better here definitely making good progress. I do not see any signs of infection which is ?great news and overall I think that we are on the right track. ?09-26-2021 upon evaluation today patient appears to be doing well with regard to his heel ulcer. This is actually measuring smaller and looking ?better today. Fortunately I do not see any evidence of active infection locally or systemically which is great news. ?10-03-2021 upon evaluation today patient appears to be doing well currently in  regard to his heel for the most part although he does have a little ?bit of what appears to be bruising in the base of the wound. I am not sure exactly where to get the pressure from but this very likely is happening ?when he is walking he tells me has been wearing some other shoes been a little bit more on his feet than normal. Fortunately I do not see any ?evidence of active infection at this time. I have contemplated put him in a different type of shoe or even a total contact cast to be honest. With ?that being said his balance is not good and I therefore have some concerns in that regard. ?Electronic Signature(s) ?Signed: 10/03/2021 1:27:03 PM By: Lenda Kelp PA-C ?Entered By: Lenda Kelp on 10/03/2021 13:27:02 ?Gilvin, Boen W. ([id number]) ?-------------------------------------------------------------------------------- ?Physical Exam Details ?Patient Name: Anello, [name] ?Date of Service: 10/03/2021 9:45 AM ?Medical Record Number: [id number] ?Patient Account Number: [id number] ?Date of Birth/Sex: [birth year] (66 y.o. M) ?Treating RN: Angelina Pih ?Primary Care Provider: [name] Other Clinician: ?Referring Provider: [name] ?Treating Provider/Extender: Allen Derry ?Weeks in Treatment: 8 ?Constitutional ?Well-nourished and well-hydrated in no acute distress. ?Respiratory ?normal breathing without difficulty. ?Psychiatric ?this patient is able to make decisions and demonstrates good insight into disease process. Alert and Oriented x 3. pleasant and cooperative. ?Notes ?Upon inspection patient's wound bed actually showed signs of good granulation and epithelization for the most part he did have some necrotic ?tissue and what appeared to be some deep tissue injury/bruising and central portion of the wound. I did perform debridement to clear most of this ?away and postdebridement it looks better still I think we need to focus more on try to keep pressure off as much as possible. ?Electronic  Signature(s) ?Signed: 10/03/2021 1:27:30 PM By: Lenda Kelp PA-C ?Entered  By: Lenda Kelp on 10/03/2021 13:27:30 ?Groft, Ruairi W. ([id number]) ?---------------------------------------------------------------

## 2021-10-10 ENCOUNTER — Encounter: Payer: No Typology Code available for payment source | Admitting: Physician Assistant

## 2021-10-17 ENCOUNTER — Ambulatory Visit
Admission: RE | Admit: 2021-10-17 | Discharge: 2021-10-17 | Disposition: A | Discharge status: Home / Self Care | Payer: No Typology Code available for payment source | Attending: Physician Assistant | Admitting: Physician Assistant

## 2021-10-17 ENCOUNTER — Other Ambulatory Visit: Payer: Self-pay | Admitting: Physician Assistant

## 2021-10-17 ENCOUNTER — Ambulatory Visit
Admission: RE | Admit: 2021-10-17 | Discharge: 2021-10-17 | Disposition: A | Discharge status: Home / Self Care | Payer: No Typology Code available for payment source | Source: Ambulatory Visit | Attending: Physician Assistant | Admitting: Physician Assistant

## 2021-10-17 ENCOUNTER — Other Ambulatory Visit
Admission: RE | Admit: 2021-10-17 | Discharge: 2021-10-17 | Disposition: A | Discharge status: Home / Self Care | Payer: No Typology Code available for payment source | Source: Ambulatory Visit | Attending: Physician Assistant | Admitting: Physician Assistant

## 2021-10-17 ENCOUNTER — Encounter: Payer: No Typology Code available for payment source | Attending: Physician Assistant | Admitting: Physician Assistant

## 2021-10-17 DIAGNOSIS — B999 Unspecified infectious disease: Secondary | ICD-10-CM | POA: Insufficient documentation

## 2021-10-17 DIAGNOSIS — I10 Essential (primary) hypertension: Secondary | ICD-10-CM | POA: Insufficient documentation

## 2021-10-17 DIAGNOSIS — I872 Venous insufficiency (chronic) (peripheral): Secondary | ICD-10-CM | POA: Diagnosis not present

## 2021-10-17 DIAGNOSIS — E1151 Type 2 diabetes mellitus with diabetic peripheral angiopathy without gangrene: Secondary | ICD-10-CM | POA: Diagnosis not present

## 2021-10-17 DIAGNOSIS — E11621 Type 2 diabetes mellitus with foot ulcer: Secondary | ICD-10-CM | POA: Diagnosis present

## 2021-10-17 DIAGNOSIS — L97422 Non-pressure chronic ulcer of left heel and midfoot with fat layer exposed: Secondary | ICD-10-CM | POA: Insufficient documentation

## 2021-10-17 NOTE — Progress Notes (Addendum)
[name], [name] ([id number]) ?Visit Report for 10/17/2021 ?Chief Complaint Document Details ?Patient Name: [name] ?Date of Service: 10/17/2021 9:00 AM ?Medical Record Number: [id number] ?Patient Account Number: [id number] ?Date of Birth/Sex: 05-27-1956 (66 y.o. M) ?Treating RN: Angelina Pih ?Primary Care Provider: [name] Other Clinician: ?Referring Provider: [name] ?Treating Provider/Extender: Allen Derry ?Weeks in Treatment: 10 ?Information Obtained from: Patient ?Chief Complaint ?Left heel ulcer ?Electronic Signature(s) ?Signed: 10/17/2021 8:53:27 AM By: Lenda Kelp PA-C ?Entered By: Lenda Kelp on 10/17/2021 08:53:27 ?[name], [name] ([id number]) ?-------------------------------------------------------------------------------- ?Debridement Details ?Patient Name: [name] ?Date of Service: 10/17/2021 9:00 AM ?Medical Record Number: [id number] ?Patient Account Number: [id number] ?Date of Birth/Sex: Dec 30, 1955 (66 y.o. M) ?Treating RN: Angelina Pih ?Primary Care Provider: [name] Other Clinician: ?Referring Provider: [name] ?Treating Provider/Extender: Allen Derry ?Weeks in Treatment: 10 ?Debridement Performed for ?Wound #1 Left Calcaneus ?Assessment: ?Performed By: Physician Nelida Meuse., PA-C ?Debridement Type: Debridement ?Severity of Tissue Pre Debridement: Fat layer exposed ?Level of Consciousness (Pre- ?Awake and Alert ?procedure): ?Pre-procedure Verification/Time Out ?Yes - 09:28 ?Taken: ?Total Area Debrided (L x W): 1 (cm) x 4 (cm) = 4 (cm?) ?Tissue and other material ?Non-Viable, Skin: Dermis , Skin: Epidermis ?debrided: ?Level: Skin/Epidermis ?Debridement Description: Selective/Open Wound ?Instrument: Forceps, Scissors ?Bleeding: None ?Response to Treatment: Procedure was tolerated well ?Level of Consciousness (Post- ?Awake and Alert ?procedure): ?Post Debridement Measurements of Total Wound ?Length: (cm) 5 ?Width: (cm) 4 ?Depth: (cm) 0.2 ?Volume: (cm?) 3.142 ?Character of  Wound/Ulcer Post Debridement: Stable ?Severity of Tissue Post Debridement: Fat layer exposed ?Post Procedure Diagnosis ?Same as Pre-procedure ?Notes ?dead skin removed only ?Electronic Signature(s) ?Signed: 10/17/2021 11:45:50 AM By: Lenda Kelp PA-C ?Signed: 10/17/2021 4:42:16 PM By: Angelina Pih ?Entered By: Angelina Pih on 10/17/2021 09:29:08 ?[name], [name] ([id number]) ?-------------------------------------------------------------------------------- ?HPI Details ?Patient Name: [name] ?Date of Service: 10/17/2021 9:00 AM ?Medical Record Number: [id number] ?Patient Account Number: [id number] ?Date of Birth/Sex: [birth year] (66 y.o. M) ?Treating RN: Angelina Pih ?Primary Care Provider: [name] Other Clinician: ?Referring Provider: [name] ?Treating Provider/Extender: Allen Derry ?Weeks in Treatment: 10 ?History of Present Illness ?HPI Description: 08/08/2021 upon evaluation today patient appears to be doing somewhat poorly in regard to a heel ulcer that began as a result ?of the patient tells me him trying to get his foot into his shoes that were somewhat tight. This was around 4 January. He was using a shoehorn ?fairly aggressively and thinks that he may have caused the bruising at that time. Fortunately there does not appear to be any signs of infection at ?this point. Nonetheless he does have some definite necrotic tissue. This can need to be cleared away. Nonetheless unfortunately his ABIs were ?noncompressible we do not have clearance in my opinion therefore to go forward with an aggressive sharp debridement. ?Patient does have diabetes mellitus type 2 with the most recent hemoglobin A1c of 5.2 and that was on June 2022. He also has a history of ?chronic venous insufficiency, peripheral vascular disease, and hypertension. Obviously with regard to the peripheral vascular disease we will ?continue to send him to vascular to quantify his ABIs and TBI's. ?08/22/2021 upon evaluation today patient  appears to be doing well with regard to his wound. This is definitely showing some signs of improvement ?which is great news and overall very pleased with where things stand today. There does not appear to be any evidence of active infection locally ?nor systemically which is great news and overall I feel  like that this is showing signs of improvement. It is definitely much less irritated as ?compared to last week. I am very pleased in that regard I am good to be able to get a lot of this dry skin off today as well which is great news. ?09/05/2021 upon evaluation today patient's heel actually showing signs of some improvement though there is still a bit of eschar noted in the ?center part of the wound. I do believe this is going require some sharp debridement to clear this away. The Iodosorb just is not quite cutting it. ?Fortunately there does not appear to be any signs of active infection at this time locally nor systemically. ?09/12/2021 upon evaluation today patient appears to be doing well with regard to his wound. He has been tolerating the dressing changes I do ?believe that clearing off the eschar last week has been to help there is still some necrotic tissue removed today overall he seems to be doing quite ?well however. ?09/20/2021 upon evaluation today patient appears to be doing well with regard to his wound he does have some necrotic tissue noted on the ?surface of the wound but I do feel like we are getting better here definitely making good progress. I do not see any signs of infection which is ?great news and overall I think that we are on the right track. ?09-26-2021 upon evaluation today patient appears to be doing well with regard to his heel ulcer. This is actually measuring smaller and looking ?better today. Fortunately I do not see any evidence of active infection locally or systemically which is great news. ?10-03-2021 upon evaluation today patient appears to be doing well currently in regard to his  heel for the most part although he does have a little ?bit of what appears to be bruising in the base of the wound. I am not sure exactly where to get the pressure from but this very likely is happening ?when he is walking he tells me has been wearing some other shoes been a little bit more on his feet than normal. Fortunately I do not see any ?evidence of active infection at this time. I have contemplated put him in a different type of shoe or even a total contact cast to be honest. With ?that being said his balance is not good and I therefore have some concerns in that regard. ?10-17-2021 upon evaluation today patient appears to be doing poorly in regard to his heel. Again this is showing signs of significant deep tissue ?injury which is bringing into question whether or not he may actually have something going on here which is more related to his blood flow. We ?have previously done arterial study though to be honest with ABIs right at 1 and noncompressible which tends to make me think this may be ?falsely elevated coupled with TBI's which were at 0.5 with biphasic waveforms I am not certain he is getting the blood flow needed to completely ?heal this wound. I discussed with him today that he may need to see vascular in order to discuss options for her further angiogram/evaluation in ?general. I would at least leave it up to them as to whether they feel the angiogram is necessary or not. Nonetheless I do believe pressure is also ?playing a component to this he does tell me he started sleeping with his heel on the bed not raised underneath the pillow already this was dark ?and discolored that may have been what triggered this to get worse I  am not really sure. ?Electronic Signature(s) ?Signed: 10/17/2021 11:13:46 AM By: Lenda Kelp PA-C ?Entered By: Lenda Kelp on 10/17/2021 11:13:46 ?[name], Dontarius W. ([id number]) ?-------------------------------------------------------------------------------- ?Physical Exam  Details ?Patient Name: [name] ?Date of Service: 10/17/2021 9:00 AM ?Medical Record Number: [id number] ?Patient Account Number: [id number] ?Date of Birth/Sex: 1956/03/09 (66 y.o. M) ?Treating RN: Roger Shelter

## 2021-10-17 NOTE — Progress Notes (Signed)
[name], [name] ?Visit Report for 10/17/2021 ?Arrival Information Details ?Patient Name: [name] ?Date of Service: 10/17/2021 9:00 AM ?Medical Record Number: [id number] ?Patient Account Number: [id number] ?Date of Birth/Sex: 02-04-1956 (66 y.o. M) ?Treating RN: Levora Dredge ?Primary Care Kewon Statler: Alysia Penna Other Clinician: ?Referring Myrta Mercer: Alysia Penna ?Treating Andrell Tallman/Extender: Jeri Cos ?Weeks in Treatment: 10 ?Visit Information History Since Last Visit ?Added or deleted any medications: No ?Patient Arrived: [name] ?Any new allergies or adverse reactions: No ?Arrival Time: 08:52 ?Had a fall or experienced change in No ?Accompanied By: self ?activities of daily living that may affect ?Transfer Assistance: None ?risk of falls: ?Patient Identification Verified: Yes ?Hospitalized since last visit: No ?Secondary Verification Process Completed: Yes ?Has Dressing in Place as Prescribed: Yes ?Patient Has Alerts: Yes ?Pain Present Now: No ?Patient Alerts: ABI R >1.0 [place] TBI 0.50 ?ABI L 0.98 ?Electronic Signature(s) ?Signed: 10/17/2021 4:42:16 PM By: Levora Dredge ?Entered By: Levora Dredge on 10/17/2021 08:53:15 ?Fales, [name] ([id number]) ?-------------------------------------------------------------------------------- ?Clinic Level of Care Assessment Details ?Patient Name: [name] ?Date of Service: 10/17/2021 9:00 AM ?Medical Record Number: [id number] ?Patient Account Number: [id number] ?Date of Birth/Sex: 1956-01-31 (66 y.o. M) ?Treating RN: Levora Dredge ?Primary Care Abdulhamid Olgin: Alysia Penna Other Clinician: ?Referring Mccoy Testa: Alysia Penna ?Treating Dianey Suchy/Extender: Jeri Cos ?Weeks in Treatment: 10 ?Clinic Level of Care Assessment Items ?TOOL 1 Quantity Score ?[]  - Use when EandM and Procedure is performed on INITIAL visit 0 ?ASSESSMENTS - Nursing Assessment / Reassessment ?[]  - General Physical Exam (combine w/ comprehensive assessment (listed just below) when performed on new ?0 ?pt.  evals) ?[]  - 0 ?Comprehensive Assessment (HX, ROS, Risk Assessments, Wounds Hx, etc.) ?ASSESSMENTS - Wound and Skin Assessment / Reassessment ?[]  - Dermatologic / Skin Assessment (not related to wound area) 0 ?ASSESSMENTS - Ostomy and/or Continence Assessment and Care ?[]  - Incontinence Assessment and Management 0 ?[]  - 0 ?Ostomy Care Assessment and Management (repouching, etc.) ?PROCESS - Coordination of Care ?[]  - Simple Patient / Family Education for ongoing care 0 ?[]  - 0 ?Complex (extensive) Patient / Family Education for ongoing care ?[]  - 0 ?Staff obtains Consents, Records, Test Results / Process Orders ?[]  - 0 ?Staff telephones HHA, Nursing Homes / Clarify orders / etc ?[]  - 0 ?Routine Transfer to another Facility (non-emergent condition) ?[]  - 0 ?Routine Hospital Admission (non-emergent condition) ?[]  - 0 ?New Admissions / Biomedical engineer / Ordering NPWT, Apligraf, etc. ?[]  - 0 ?Emergency Hospital Admission (emergent condition) ?PROCESS - Special Needs ?[]  - Pediatric / Minor Patient Management 0 ?[]  - 0 ?Isolation Patient Management ?[]  - 0 ?Hearing / Language / Visual special needs ?[]  - 0 ?Assessment of Community assistance (transportation, D/C planning, etc.) ?[]  - 0 ?Additional assistance / Altered mentation ?[]  - 0 ?Support Surface(s) Assessment (bed, cushion, seat, etc.) ?INTERVENTIONS - Miscellaneous ?[]  - External ear exam 0 ?[]  - 0 ?Patient Transfer (multiple staff / Civil Service fast streamer / Similar devices) ?[]  - 0 ?Simple Staple / Suture removal (25 or less) ?[]  - 0 ?Complex Staple / Suture removal (26 or more) ?[]  - 0 ?Hypo/Hyperglycemic Management (do not check if billed separately) ?[]  - 0 ?Ankle / Brachial Index (ABI) - do not check if billed separately ?Has the patient been seen at the hospital within the last three years: Yes ?Total Score: 0 ?Level Of Care: ____ ?[name], [name] ?Electronic Signature(s) ?Signed: 10/17/2021 4:42:16 PM By: Levora Dredge ?Entered By: Levora Dredge  on 10/17/2021 13:42:58 ?[name], [name] ?-------------------------------------------------------------------------------- ?Encounter Discharge Information  Details ?Patient Name: [name] ?Date of Service: 10/17/2021 9:00 AM ?Medical Record Number: [id number] ?Patient Account Number: [id number] ?Date of Birth/Sex: 09/25/1955 (66 y.o. M) ?Treating RN: Levora Dredge ?Primary Care Bhavana Kady: Alysia Penna Other Clinician: ?Referring Mykenzie Ebanks: Alysia Penna ?Treating Emylia Latella/Extender: Jeri Cos ?Weeks in Treatment: 10 ?Encounter Discharge Information Items Post Procedure Vitals ?Discharge Condition: Stable ?Temperature (?F): 98.1 ?Ambulatory Status: [name] ?Pulse (bpm): 87 ?Discharge Destination: Home ?Respiratory Rate (breaths/min): 18 ?Transportation: Private Auto ?Blood Pressure (mmHg): 185/71 ?Accompanied By: self ?Schedule Follow-up Appointment: Yes ?Clinical Summary of Care: Patient Declined ?Notes ?PA Stone was made aware of increased BP ?Electronic Signature(s) ?Signed: 10/17/2021 1:44:34 PM By: Levora Dredge ?Entered By: Levora Dredge on 10/17/2021 13:44:34 ?[name], [name] ([id number]) ?-------------------------------------------------------------------------------- ?Lower Extremity Assessment Details ?Patient Name: [name] ?Date of Service: 10/17/2021 9:00 AM ?Medical Record Number: [id number] ?Patient Account Number: [id number] ?Date of Birth/Sex: 02-Sep-1955 (66 y.o. M) ?Treating RN: Levora Dredge ?Primary Care Lunden Stieber: Alysia Penna Other Clinician: ?Referring Kutler Vanvranken: Alysia Penna ?Treating Kaydi Kley/Extender: Jeri Cos ?Weeks in Treatment: 10 ?Edema Assessment ?Assessed: [Left: No] [Right: No] ?Edema: [Left: Ye] [Right: s] ?Calf ?Left: Right: ?Point of Measurement: 32 cm From Medial Instep 37.3 cm ?Ankle ?Left: Right: ?Point of Measurement: 10 cm From Medial Instep 26 cm ?Vascular Assessment ?Pulses: ?Dorsalis Pedis ?Palpable: [Left:Yes] ?Electronic Signature(s) ?Signed: 10/17/2021 4:42:16 PM  By: Levora Dredge ?Entered By: Levora Dredge on 10/17/2021 09:09:16 ?[name], [name] ([id number]) ?-------------------------------------------------------------------------------- ?Multi Wound Chart Details ?Patient Name: [name] ?Date of Service: 10/17/2021 9:00 AM ?Medical Record Number: [id number] ?Patient Account Number: [id number] ?Date of Birth/Sex: 1956-05-03 (66 y.o. 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 ?Comorbid History: Cataracts, Hypertension, Type II N/A N/A ?Diabetes, Neuropathy ?Date Acquired: 06/19/2021 N/A N/A ?Weeks of Treatment: 10 N/A N/A ?Wound Status: Open N/A N/A ?Wound Recurrence: No N/A N/A ?Measurements L x W x D (cm) 5x4x0.3 N/A N/A ?Area (cm?) : 15.708 N/A N/A ?Volume (cm?) : 4.712 N/A N/A ?% Reduction in Area: -592.00% N/A N/A ?% Reduction in Volume: -1975.80% N/A N/A ?Starting Position 1 (o'clock): 11 ?Ending Position 1 (o'clock): 1 ?Maximum Distance 1 (cm): 0.5 ?Undermining: Yes N/A N/A ?Classification: Grade 2 N/A N/A ?Exudate Amount: Medium N/A N/A ?Exudate Type: Serosanguineous N/A N/A ?Exudate Color: red, brown N/A N/A ?Granulation Amount: Small (1-33%) N/A N/A ?Granulation Quality: Red, Pink N/A N/A ?Necrotic Amount: Large (67-100%) N/A N/A ?Exposed Structures: ?Fat Layer (Subcutaneous Tissue): N/A N/A ?Yes ?Epithelialization: Small (1-33%) N/A N/A ?Treatment Notes ?Electronic Signature(s) ?Signed: 10/17/2021 4:42:16 PM By: Levora Dredge ?Entered By: Levora Dredge on 10/17/2021  09:17:16 ?[name], [name] ([id number]) ?-------------------------------------------------------------------------------- ?Multi-Disciplinary Care Plan Details ?Patient Name: [name], Damarius W. ?Date of Service: 5/1/

## 2021-10-22 LAB — AEROBIC CULTURE W GRAM STAIN (SUPERFICIAL SPECIMEN): Gram Stain: NONE SEEN

## 2021-10-24 ENCOUNTER — Encounter: Payer: No Typology Code available for payment source | Admitting: Internal Medicine

## 2021-10-24 ENCOUNTER — Other Ambulatory Visit: Payer: Self-pay | Admitting: Internal Medicine

## 2021-10-24 DIAGNOSIS — L97422 Non-pressure chronic ulcer of left heel and midfoot with fat layer exposed: Secondary | ICD-10-CM

## 2021-10-24 DIAGNOSIS — E11621 Type 2 diabetes mellitus with foot ulcer: Secondary | ICD-10-CM | POA: Diagnosis not present

## 2021-10-24 NOTE — Progress Notes (Addendum)
Bodner, Dhillon W. ([id number]) Visit Report for 10/24/2021 Debridement Details Patient Name: [name], [name] Date of Service: 10/24/2021 9:00 AM Medical Record Number: [id number] Patient Account Number: [id number] Date of Birth/Sex: [birth year] (66 y.o. M) Treating RN: Huel Coventry Primary Care Provider: [name] Other Clinician: Referring Provider: [name] Treating Provider/Extender: Altamese [place] in Treatment: 11 Debridement Performed for Wound #1 Left Calcaneus Assessment: Performed By: Physician Maxwell Caul, MD Debridement Type: Chemical/Enzymatic/Mechanical Agent Used: Santyl Severity of Tissue Pre Debridement: Fat layer exposed Level of Consciousness (Pre- Awake and Alert procedure): Pre-procedure Verification/Time Out Yes - 09:15 Taken: Start Time: 09:15 Instrument: Other : tongue blade Bleeding: None Response to Treatment: Procedure was tolerated well Level of Consciousness (Post- Awake and Alert procedure): Post Debridement Measurements of Total Wound Length: (cm) 4 Width: (cm) 4 Depth: (cm) 0.1 Volume: (cm) 1.257 Character of Wound/Ulcer Post Debridement: Stable Severity of Tissue Post Debridement: Fat layer exposed Post Procedure Diagnosis Same as Pre-procedure Electronic Signature(s) Signed: 10/24/2021 4:28:03 PM By: Elliot Gurney, BSN, RN, CWS, Kim RN, BSN Signed: 10/25/2021 6:42:33 AM By: Baltazar Najjar MD Entered By: Elliot Gurney, BSN, RN, CWS, Kim on 10/24/2021 09:34:58 [name], [name] ([id number]) -------------------------------------------------------------------------------- HPI Details Patient Name: [name], [name] Date of Service: 10/24/2021 9:00 AM Medical Record Number: [id number] Patient Account Number: [id number] Date of Birth/Sex: 06/16/1956 (66 y.o. M) Treating RN: Huel Coventry Primary Care Provider: [name] Other Clinician: Referring Provider: [name] Treating Provider/Extender: Altamese [place] in Treatment: 11 History of Present  Illness HPI Description: 08/08/2021 upon evaluation today patient appears to be doing somewhat poorly in regard to a heel ulcer that began as a result of the patient tells me him trying to get his foot into his shoes that were somewhat tight. This was around 4 January. He was using a shoehorn fairly aggressively and thinks that he may have caused the bruising at that time. Fortunately there does not appear to be any signs of infection at this point. Nonetheless he does have some definite necrotic tissue. This can need to be cleared away. Nonetheless unfortunately [name] we do not have clearance in my opinion therefore to go forward with an aggressive sharp debridement. Patient does have diabetes mellitus type 2 with the most recent hemoglobin A1c of 5.2 and that was on June 2022. He also has a history of chronic venous insufficiency, peripheral vascular disease, and hypertension. Obviously with regard to the peripheral vascular disease we will continue to send him to vascular to quantify his ABIs and TBI's. 08/22/2021 upon evaluation today patient appears to be doing well with regard to his wound. This is definitely showing some signs of improvement which is great news and overall very pleased with where things stand today. There does not appear to be any evidence of active infection locally nor systemically which is great news and overall I feel like that this is showing signs of improvement. It is definitely much less irritated as compared to last week. I am very pleased in that regard I am good to be able to get a lot of this dry skin off today as well which is great news. 09/05/2021 upon evaluation today patient's heel actually showing signs of some improvement though there is still a bit of eschar noted in the center part of the wound. I do believe this is going require some sharp debridement to clear this away. The Iodosorb just is not quite cutting it. Fortunately there does  not appear to be  any signs of active infection at this time locally nor systemically. 09/12/2021 upon evaluation today patient appears to be doing well with regard to his wound. He has been tolerating the dressing changes I do believe that clearing off the eschar last week has been to help there is still some necrotic tissue removed today overall he seems to be doing quite well however. 09/20/2021 upon evaluation today patient appears to be doing well with regard to his wound he does have some necrotic tissue noted on the surface of the wound but I do feel like we are getting better here definitely making good progress. I do not see any signs of infection which is great news and overall I think that we are on the right track. 09-26-2021 upon evaluation today patient appears to be doing well with regard to his heel ulcer. This is actually measuring smaller and looking better today. Fortunately I do not see any evidence of active infection locally or systemically which is great news. 10-03-2021 upon evaluation today patient appears to be doing well currently in regard to his heel for the most part although he does have a little bit of what appears to be bruising in the base of the wound. I am not sure exactly where to get the pressure from but this very likely is happening when he is walking he tells me has been wearing some other shoes been a little bit more on his feet than normal. Fortunately I do not see any evidence of active infection at this time. I have contemplated put him in a different type of shoe or even a total contact cast to be honest. With that being said his balance is not good and I therefore have some concerns in that regard. 10-17-2021 upon evaluation today patient appears to be doing poorly in regard to his heel. Again this is showing signs of significant deep tissue injury which is bringing into question whether or not he may actually have something going on here which is more related to  his blood flow. We have previously done arterial study though to be honest with ABIs right at 1 and noncompressible which tends to make me think this may be falsely elevated coupled with TBI's which were at 0.5 with biphasic waveforms I am not certain he is getting the blood flow needed to completely heal this wound. I discussed with him today that he may need to see vascular in order to discuss options for her further angiogram/evaluation in general. I would at least leave it up to them as to whether they feel the angiogram is necessary or not. Nonetheless I do believe pressure is also playing a component to this he does tell me he started sleeping with his heel on the bed not raised underneath the pillow already this was dark and discolored that may have been what triggered this to get worse I am not really sure. 5/8; the wound seems to have less of a deep tissue injury but there is still eschar. Scant amount of purulent drainage some erythema medially extending up the heel. He has neuropathy no pain no systemic illness. Culture that we did last time showed moderate Proteus, moderate Enterococcus and moderate Pseudomonas. The Enterococcus will require ampicillin so he will require 2 antibiotics. He is using Santyl to the wound Electronic Signature(s) Signed: 10/25/2021 6:42:33 AM By: Baltazar Najjar MD Entered By: Baltazar Najjar on 10/24/2021 10:13:05 [name], [name] ([id number]) -------------------------------------------------------------------------------- Physical Exam Details Patient Name: [name], [name] Date of  Service: 10/24/2021 9:00 AM Medical Record Number: [id number] Patient Account Number: [id number] Date of Birth/Sex: 05/10/1956 (66 y.o. M) Treating RN: Huel Coventry Primary Care Provider: [name] Other Clinician: Referring Provider: [name] Treating Provider/Extender: Altamese [place] in Treatment: 11 Constitutional Patient is hypertensive.. Pulse regular and within  target range for patient.Marland Kitchen Respirations regular, non-labored and within target range.. Temperature is normal and within the target range for the patient.Marland Kitchen appears in no distress. Notes Wound exam; left heel. Still black eschar with surrounding tissue that looks somewhat better. Versus last weeks picture there is less of a deep tissue injury appearance. Scant amount of purulent drainage and some degree of erythema on the medial part of the heel going superiorly to a certain degree. He does not appear to be septic. No debridement was done. Electronic Signature(s) Signed: 10/25/2021 6:42:33 AM By: Baltazar Najjar MD Entered By: Baltazar Najjar on 10/24/2021 10:14:10 [name], [name] ([id number]) -------------------------------------------------------------------------------- Physician Orders Details Patient Name: [name], [name] Date of Service: 10/24/2021 9:00 AM Medical Record Number: [id number] Patient Account Number: [id number] Date of Birth/Sex: [birth year] (66 y.o. M) Treating RN: Huel Coventry Primary Care Provider: [name] Other Clinician: Referring Provider: [name] Treating Provider/Extender: Altamese [place] in Treatment: 38 Verbal / Phone Orders: No Diagnosis Coding Follow-up Appointments o Return Appointment in 1 week. o Nurse Visit as needed [employer] o Wash wounds with antibacterial soap and water. o May shower; gently cleanse wound with antibacterial soap, rinse and pat dry prior to dressing wounds - shower with dressing on and remove after shower o No tub bath. Edema Control - Lymphedema / Segmental Compressive Device / Other o Elevate, Exercise Daily and Avoid Standing for [place] Periods of Time. o Elevate leg(s) parallel to the floor when sitting. o DO YOUR BEST to sleep in the bed at night. DO NOT sleep in your recliner. [place] hours of sitting in a recliner leads to swelling of the legs and/or potential wounds on your  backside. Medications-Please add to medication list. o P.O. Antibiotics - Augmentin and Cipro Wound Treatment Wound #1 - Calcaneus Wound Laterality: Left Topical: Santyl Collagenase Ointment, 30 (gm), tube 1 x Per Day/30 Days Discharge Instructions: apply nickel thick to wound bed only Primary Dressing: Gauze (Generic) 1 x Per Day/30 Days Discharge Instructions: As directed moistened with saline Secondary Dressing: ABD Pad 5x9 (in/in) (Generic) 1 x Per Day/30 Days Discharge Instructions: Cover with ABD pad Secondary Dressing: Kerlix 4.5 x 4.1 (in/yd) (Generic) 1 x Per Day/30 Days Discharge Instructions: Apply Kerlix 4.5 x 4.1 (in/yd) as instructed Secured With: Medipore Tape - 15M Medipore H Soft Cloth Surgical Tape, 2x2 (in/yd) 1 x Per Day/30 Days Radiology o MRI with and without Contrast - 11/03/2021 Electronic Signature(s) Signed: 11/07/2021 5:55:38 PM By: Elliot Gurney, BSN, RN, CWS, Kim RN, BSN Signed: 11/29/2021 7:55:57 AM By: Baltazar Najjar MD Previous Signature: 10/24/2021 11:58:14 AM Version By: Baltazar Najjar MD Previous Signature: 10/24/2021 9:33:18 AM Version By: Baltazar Najjar MD Entered By: Elliot Gurney, BSN, RN, CWS, Kim on 11/07/2021 11:07:58 [name], [name] ([id number]) -------------------------------------------------------------------------------- Problem List Details Patient Name: [name], [name] Date of Service: 10/24/2021 9:00 AM Medical Record Number: [id number] Patient Account Number: [id number] Date of Birth/Sex: 06-09-1956 (66 y.o. 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 ([id number]) -------------------------------------------------------------------------------- Progress Note Details Patient Name: [name], [name] Date of Service: 10/24/2021 9:00 AM Medical Record Number: [id number] Patient Account Number: [id number] Date of Birth/Sex: 03/07/1956 (66 y.o. M) Treating RN: Huel CoventryWoody, Kim Primary Care Provider: Gershon CraneFry, Stephen Other Clinician: Referring Provider: Gershon CraneFry, Stephen Treating Provider/Extender: Altamese [place]OBSON, Jhonnie Aliano G [place] in Treatment: 11 Subjective History of Present Illness (HPI) 08/08/2021 upon evaluation today patient appears to be doing somewhat poorly in regard to a heel ulcer that began as a result of the patient tells me him trying to get his foot into his shoes that were somewhat tight. This was around 4 January. He was using a shoehorn fairly aggressively and thinks that he may have caused the bruising at that time. Fortunately there does not appear to be any signs of infection at this point. Nonetheless he does have some definite necrotic tissue. This can need to be cleared away. Nonetheless unfortunately [name] we do not have clearance in my opinion therefore to go forward with an aggressive sharp debridement. Patient does have diabetes mellitus type 2 with the most recent hemoglobin A1c of 5.2 and that was on June 2022. He also has a history of chronic venous insufficiency, peripheral vascular disease, and hypertension. Obviously with regard to the peripheral vascular disease we will continue to send him to vascular to quantify his ABIs and TBI's. 08/22/2021 upon evaluation today  patient appears to be doing well with regard to his wound. This is definitely showing some signs of improvement which is great news and overall very pleased with where things stand today. There does not appear to be any evidence of active infection locally nor systemically which is great news and overall I feel like that this is showing signs of improvement. It is definitely much less irritated as compared to last week. I am very pleased in that regard I am good to be able to get a lot of this dry skin off today as well which is great news. 09/05/2021 upon evaluation today patient's heel actually showing signs of some improvement though there is still a bit of eschar noted in the center part of the wound. I do believe this is going require some sharp debridement to clear this away. The Iodosorb just is not quite cutting it. Fortunately there does not appear to be any signs of active infection at this time locally nor systemically. 09/12/2021 upon evaluation today patient appears to be doing well with regard to his wound. He has been tolerating the dressing changes I do believe that clearing off the eschar last week has been to help there is still some necrotic tissue removed today overall he seems to be doing quite well however. 09/20/2021 upon evaluation today patient appears to be doing well with regard to his wound he does have some necrotic tissue noted on the surface of the wound but I do feel like we are getting better here definitely making good progress. I do not see any signs of infection which is great news and overall I think that we are on the right track. 09-26-2021 upon evaluation today patient appears  to be doing well with regard to his heel ulcer. This is actually measuring smaller and looking better today. Fortunately I do not see any evidence of active infection locally or systemically which is great news. 10-03-2021 upon evaluation today patient appears to be doing well currently in  regard to his heel for the most part although he does have a little bit of what appears to be bruising in the base of the wound. I am not sure exactly where to get the pressure from but this very likely is happening when he is walking he tells me has been wearing some other shoes been a little bit more on his feet than normal. Fortunately I do not see any evidence of active infection at this time. I have contemplated put him in a different type of shoe or even a total contact cast to be honest. With that being said his balance is not good and I therefore have some concerns in that regard. 10-17-2021 upon evaluation today patient appears to be doing poorly in regard to his heel. Again this is showing signs of significant deep tissue injury which is bringing into question whether or not he may actually have something going on here which is more related to his blood flow. We have previously done arterial study though to be honest with ABIs right at 1 and noncompressible which tends to make me think this may be falsely elevated coupled with TBI's which were at 0.5 with biphasic waveforms I am not certain he is getting the blood flow needed to completely heal this wound. I discussed with him today that he may need to see vascular in order to discuss options for her further angiogram/evaluation in general. I would at least leave it up to them as to whether they feel the angiogram is necessary or not. Nonetheless I do believe pressure is also playing a component to this he does tell me he started sleeping with his heel on the bed not raised underneath the pillow already this was dark and discolored that may have been what triggered this to get worse I am not really sure. 5/8; the wound seems to have less of a deep tissue injury but there is still eschar. Scant amount of purulent drainage some erythema medially extending up the heel. He has neuropathy no pain no systemic illness. Culture that we did last time  showed moderate Proteus, moderate Enterococcus and moderate Pseudomonas. The Enterococcus will require ampicillin so he will require 2 antibiotics. He is using Santyl to the wound Objective Constitutional Patient is hypertensive.. Pulse regular and within target range for patient.Marland Kitchen Respirations regular, non-labored and within target range.Marland Kitchen [name], [name] ([id number]) Temperature is normal and within the target range for the patient.Marland Kitchen appears in no distress. Vitals Time Taken: 9:02 AM, Height: 68 in, Weight: 220 lbs, BMI: 33.4, Temperature: 98.0 F, Pulse: 65 bpm, Respiratory Rate: 18 breaths/min, Blood Pressure: 180/76 mmHg. General Notes: pt states asymptomatic, RN General Notes: Wound exam; left heel. Still black eschar with surrounding tissue that looks somewhat better. Versus last weeks picture there is less of a deep tissue injury appearance. Scant amount of purulent drainage and some degree of erythema on the medial part of the heel going superiorly to a certain degree. He does not appear to be septic. No debridement was done. Integumentary (Hair, Skin) Wound #1 status is Open. Original cause of wound was Pressure Injury. The date acquired was: 06/19/2021. The wound has been in treatment 11 weeks. The wound is  located on the Left Calcaneus. The wound measures 4cm length x 4cm width x 0.1cm depth; 12.566cm^2 area and 1.257cm^3 volume. There is Fat Layer (Subcutaneous Tissue) exposed. There is no tunneling or undermining noted. There is a medium amount of serosanguineous drainage noted. There is small (1-33%) red granulation within the wound bed. There is a large (67-100%) amount of necrotic tissue within the wound bed including Eschar. Assessment Active Problems ICD-10 Type 2 diabetes mellitus with foot ulcer Non-pressure chronic ulcer of left heel and midfoot with fat layer exposed Venous insufficiency (chronic) (peripheral) Other specified peripheral vascular diseases Essential  (primary) hypertension Procedures Wound #1 Pre-procedure diagnosis of Wound #1 is a Diabetic Wound/Ulcer of the Lower Extremity located on the Left Calcaneus .Severity of Tissue Pre Debridement is: Fat layer exposed. There was a Chemical/Enzymatic/Mechanical debridement performed by Maxwell Caul, MD. With the following instrument(s): tongue blade. Agent used was [employer]. A time out was conducted at 09:15, prior to the start of the procedure. There was no bleeding. The procedure was tolerated well. Post Debridement Measurements: 4cm length x 4cm width x 0.1cm depth; 1.257cm^3 volume. Character of Wound/Ulcer Post Debridement is stable. Severity of Tissue Post Debridement is: Fat layer exposed. Post procedure Diagnosis Wound #1: Same as Pre-Procedure Plan Follow-up Appointments: Return Appointment in 1 week. Nurse Visit as needed Bathing/ Shower/ Hygiene: Wash wounds with antibacterial soap and water. May shower; gently cleanse wound with antibacterial soap, rinse and pat dry prior to dressing wounds - shower with dressing on and remove after shower No tub bath. Edema Control - Lymphedema / Segmental Compressive Device / Other: Elevate, Exercise Daily and Avoid Standing for [place] Periods of Time. Elevate leg(s) parallel to the floor when sitting. DO YOUR BEST to sleep in the bed at night. DO NOT sleep in your recliner. [place] hours of sitting in a recliner leads to swelling of the legs and/or potential wounds on your backside. Medications-Please add to medication list.: P.O. Antibiotics - Augmentin and Cipro The following medication(s) was prescribed: Augmentin oral 875 mg-125 mg tablet 1 tablet oral bid for 10 days for left heel dfu infection starting 10/24/2021 Cipro oral 500 mg tablet 1 tablet oral bid for 10 days for DFU with infection starting 10/24/2021 WOUND #1: - Calcaneus Wound Laterality: Left [name], [name] ([id number]) Topical: Santyl Collagenase Ointment, 30 (gm), tube 1 x Per  Day/30 Days Discharge Instructions: apply nickel thick to wound bed only Primary Dressing: Gauze (Generic) 1 x Per Day/30 Days Discharge Instructions: As directed moistened with saline Secondary Dressing: ABD Pad 5x9 (in/in) (Generic) 1 x Per Day/30 Days Discharge Instructions: Cover with ABD pad Secondary Dressing: Kerlix 4.5 x 4.1 (in/yd) (Generic) 1 x Per Day/30 Days Discharge Instructions: Apply Kerlix 4.5 x 4.1 (in/yd) as instructed Secured With: Medipore Tape - 23M Medipore H Soft Cloth Surgical Tape, 2x2 (in/yd) 1 x Per Day/30 Days 1. Augmentin and ciprofloxacin both for 10 days in response to the culture that was done last week 2. The surface of the eschar is likely to require debridement although I wanted to make sure that we had antibiotics in his system before doing this 3. He had an x-ray last week that was negative for osteomyelitis. I think he is going to require an MRI in this and we have ordered that today. 4. I have counseled the patient to be aware of potential side effects of this combination of antibiotics including pseudomembranous colitis etc. no additional cultures were done. 5. Continue the Santyl for now  as mentioned he is likely to require mechanical debridement Electronic Signature(s) Signed: 10/25/2021 6:42:33 AM By: Baltazar Najjar MD Entered By: Baltazar Najjar on 10/24/2021 10:15:50 [name], [name] ([id number]) -------------------------------------------------------------------------------- SuperBill Details Patient Name: [name], [name] Date of Service: 10/24/2021 Medical Record Number: [id number] Patient Account Number: [id number] Date of Birth/Sex: [birth year] (66 y.o. M) Treating RN: Huel Coventry Primary Care Provider: [name] Other Clinician: Referring Provider: [name] Treating Provider/Extender: Altamese [place] in Treatment: 11 Diagnosis Coding ICD-10 Codes Code Description E11.621 Type 2 diabetes mellitus with foot ulcer L97.422  Non-pressure chronic ulcer of left heel and midfoot with fat layer exposed I87.2 Venous insufficiency (chronic) (peripheral) I73.89 Other specified peripheral vascular diseases I10 Essential (primary) hypertension Facility Procedures CPT4 Code: [id number] Description: [phone] - DEBRIDE W/O ANES NON SELECT Modifier: Quantity: 1 Physician Procedures CPT4 Code: [id number] Description: 99214 - WC PHYS LEVEL 4 - EST PT Modifier: Quantity: 1 CPT4 Code: Description: ICD-10 Diagnosis Description E11.621 Type 2 diabetes mellitus with foot ulcer L97.422 Non-pressure chronic ulcer of left heel and midfoot with fat layer ex Modifier: posed Quantity: Electronic Signature(s) Signed: 10/25/2021 6:42:33 AM By: Baltazar Najjar MD Entered By: Baltazar Najjar on 10/24/2021 10:16:13

## 2021-10-24 NOTE — Progress Notes (Signed)
[name], [name] ([id number]) ?Visit Report for 10/24/2021 ?Arrival Information Details ?Patient Name: [name], [name] ?Date of Service: 10/24/2021 9:00 AM ?Medical Record Number: [id number] ?Patient Account Number: [id number] ?Date of Birth/Sex: 1955-11-04 (66 y.o. M) ?Treating RN: Cornell Barman ?Primary Care Cora Stetson: Alysia Penna Other Clinician: ?Referring Blakeley Scheier: Alysia Penna ?Treating Teara Duerksen/Extender: Ricard Dillon ?Weeks in Treatment: 11 ?Visit Information History Since Last Visit ?Added or deleted any medications: No ?Patient Arrived: Ambulatory ?Has Dressing in Place as Prescribed: Yes ?Arrival Time: 08:53 ?Pain Present Now: No ?Accompanied By: self ?Transfer Assistance: None ?Patient Identification Verified: Yes ?Secondary Verification Process Completed: Yes ?Patient Has Alerts: Yes ?Patient Alerts: ABI R >1.0 [place] TBI 0.50 ?ABI L 0.98 ?Electronic Signature(s) ?Signed: 10/24/2021 10:14:48 AM By: Gretta Cool, BSN, RN, CWS, Kim RN, BSN ?Entered By: Gretta Cool, BSN, RN, CWS, Kim on 10/24/2021 10:14:48 ?[name], [name] ([id number]) ?-------------------------------------------------------------------------------- ?Encounter Discharge Information Details ?Patient Name: [name], [name] ?Date of Service: 10/24/2021 9:00 AM ?Medical Record Number: [id number] ?Patient Account Number: [id number] ?Date of Birth/Sex: 04/15/1956 (66 y.o. M) ?Treating RN: Cornell Barman ?Primary Care Zackrey Dyar: Alysia Penna Other Clinician: ?Referring Aleigha Gilani: Alysia Penna ?Treating Azazel Franze/Extender: Ricard Dillon ?Weeks in Treatment: 11 ?Encounter Discharge Information Items Post Procedure Vitals ?Discharge Condition: Stable ?Temperature (?F): 98.1 ?Ambulatory Status: Ambulatory ?Pulse (bpm): 65 ?Discharge Destination: Home ?Respiratory Rate (breaths/min): 16 ?Transportation: Private Auto ?Blood Pressure (mmHg): 180/76 ?Accompanied By: self ?Schedule Follow-up Appointment: Yes ?Clinical Summary of Care: ?Notes ?BP elevated, asymptomatic, patient to follow up with  PCP rt elevated BP at last Wound care visits. Patient understands and agrees. ?Electronic Signature(s) ?Signed: 10/24/2021 4:28:03 PM By: Gretta Cool, BSN, RN, CWS, Kim RN, BSN ?Entered By: Gretta Cool, BSN, RN, CWS, Kim on 10/24/2021 09:37:27 ?[name], [name] ([id number]) ?-------------------------------------------------------------------------------- ?Lower Extremity Assessment Details ?Patient Name: [name], [name] ?Date of Service: 10/24/2021 9:00 AM ?Medical Record Number: [id number] ?Patient Account Number: [id number] ?Date of Birth/Sex: May 16, 1956 (66 y.o. M) ?Treating RN: Cornell Barman ?Primary Care Kayan Blissett: Alysia Penna Other Clinician: ?Referring Ellena Kamen: Alysia Penna ?Treating Darius Lundberg/Extender: Ricard Dillon ?Weeks in Treatment: 11 ?Edema Assessment ?Assessed: [Left: No] [Right: No] ?[Left: Edema] [Right: :] ?Calf ?Left: Right: ?Point of Measurement: 32 cm From Medial Instep 39 cm ?Ankle ?Left: Right: ?Point of Measurement: 10 cm From Medial Instep 26 cm ?Vascular Assessment ?Pulses: ?Dorsalis Pedis ?Palpable: [Left:Yes] ?Electronic Signature(s) ?Signed: 10/24/2021 4:28:03 PM By: Gretta Cool, BSN, RN, CWS, Kim RN, BSN ?Entered By: Gretta Cool, BSN, RN, CWS, Kim on 10/24/2021 09:12:34 ?[name], [name] ?-------------------------------------------------------------------------------- ?Multi Wound Chart Details ?Patient Name: [name], [name] ?Date of Service: 10/24/2021 9:00 AM ?Medical Record Number: [id number] ?Patient Account Number: [id number] ?Date of Birth/Sex: 08/02/1955 (66 y.o. 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 ?Comorbid History: Cataracts, Hypertension, Type II N/A N/A ?Diabetes, Neuropathy ?Date Acquired: 06/19/2021 N/A N/A ?Weeks of Treatment: 11 N/A N/A ?Wound Status: Open N/A N/A ?Wound Recurrence: No N/A N/A ?Measurements L x W x D (cm) 4x4x0.1 N/A N/A ?Area (cm?) : 12.566 N/A N/A ?Volume (cm?) : 1.257 N/A N/A ?% Reduction in Area: -453.60% N/A N/A ?% Reduction in Volume: -453.70% N/A N/A ?Classification: Grade 2 N/A N/A ?Exudate Amount: Medium  N/A N/A ?Exudate Type: Serosanguineous N/A N/A ?Exudate Color: red, brown N/A N/A ?Granulation Amount: Small (1-33%) N/A N/A ?Granulation Quality: Red N/A N/A ?Necrotic Amount: Large (67-100%) N/A N/A ?Necrotic Tissue: Eschar N/A N/A ?Exposed Structures: ?Fat Layer (Subcutaneous Tissue): N/A N/A ?Yes ?Epithelialization: Small (1-33%) N/A N/A ?Treatment Notes ?Electronic Signature(s) ?Signed: 10/24/2021 4:28:03 PM By: Gretta Cool, BSN, RN, CWS, Kim RN, BSN ?Entered By: Gretta Cool, BSN, RN, CWS, Kim on 10/24/2021 09:22:15 ?[name], [name] ?-------------------------------------------------------------------------------- ?Multi-Disciplinary Care Plan Details ?Patient Name: [name], [name] ?Date of Service: 10/24/2021 9:00 AM ?Medical Record Number: [id number] ?Patient Account Number: [id number] ?Date of Birth/Sex: 03/02/1956 (66 y.o. M) ?Treating RN: Cornell Barman ?Primary Care Elijiah Mickley: Alysia Penna Other Clinician: ?Referring Olena Willy: Alysia Penna ?Treating Rayanna Matusik/Extender: Ricard Dillon ?Weeks in Treatment: 11 ?Active Inactive ?Wound/Skin Impairment ?Nursing Diagnoses: ?Impaired tissue integrity ?Knowledge deficit related to ulceration/compromised skin integrity ?Goals: ?Ulcer/skin breakdown will have a volume reduction of 30% by week 4 ?Date Initiated: 08/08/2021 ?Target Resolution Date: 09/05/2021 ?Goal Status: Active ?Ulcer/skin breakdown will have a volume reduction of 50% by week 8 ?Date Initiated:  08/08/2021 ?Target Resolution Date: 10/03/2021 ?Goal Status: Active ?Ulcer/skin breakdown will have a volume reduction of 80% by week 12 ?Date Initiated: 08/08/2021 ?Target Resolution Date: 10/31/2021 ?Goal Status: Active ?Ulcer/skin breakdown will heal within 14 weeks ?Date Initiated: 08/08/2021 ?Target Resolution Date: 11/14/2021 ?Goal Status: Active ?Interventions: ?Assess patient/caregiver ability to obtain necessary supplies ?Assess patient/caregiver ability to perform ulcer/skin care regimen upon admission and as needed ?Assess ulceration(s) every visit ?Provide education on ulcer and skin care ?Notes: ?Electronic Signature(s) ?Signed: 10/24/2021 4:28:03 PM By: Gretta Cool, BSN, RN, CWS, Kim RN, BSN ?Entered By: Gretta Cool, BSN, RN, CWS, Kim on 10/24/2021 09:13:01 ?[name], [name] ?-------------------------------------------------------------------------------- ?Pain Assessment Details ?Patient Name: [name], [name] ?Date of Service: 10/24/2021 9:00 AM ?Medical Record Number: [id number] ?Patient Account Number: [id number] ?Date of Birth/Sex: 1955/07/03 (66 y.o. M) ?Treating RN: Cornell Barman ?Primary Care Zehava Turski: Alysia Penna Other Clinician: ?Referring Hamlin Devine: Alysia Penna ?Treating Jaquavian Firkus/Extender: Ricard Dillon ?Weeks in Treatment: 11 ?Active Problems ?Location of Pain Severity and Description of Pain ?Patient Has Paino No ?Site Locations ?Pain Management and Medication ?Current Pain Management: ?Notes ?PAtient denies pain at this time. ?Electronic Signature(s) ?Signed: 10/24/2021 4:28:03 PM By: Gretta Cool, BSN, RN, CWS, Kim RN, BSN ?Entered By: Gretta Cool, BSN, RN, CWS, Kim on 10/24/2021 09:05:53 ?[name], [name] ?-------------------------------------------------------------------------------- ?Patient/Caregiver Education Details ?Patient Name: [name], [name] ?Date of Service: 10/24/2021 9:00 AM ?Medical Record Number: [id number] ?Patient Account Number: [id number] ?Date of Birth/Gender: 08-28-1955 (66 y.o. M) ?Treating  RN: Cornell Barman ?Primary Care Physician: Alysia Penna Other Clinician: ?Referring Physician: Alysia Penna ?Treating Physician/Extender: Ricard Dillon ?Weeks in Treatment: 11 ?Education Assessment ?Jasper Loser

## 2021-10-31 ENCOUNTER — Encounter: Payer: No Typology Code available for payment source | Admitting: Physician Assistant

## 2021-10-31 DIAGNOSIS — E11621 Type 2 diabetes mellitus with foot ulcer: Secondary | ICD-10-CM | POA: Diagnosis not present

## 2021-10-31 NOTE — Progress Notes (Addendum)
Percival, Lowery W. ([id number]) ?Visit Report for 10/31/2021 ?Chief Complaint Document Details ?Patient Name: [name] ?Date of Service: 10/31/2021 9:45 AM ?Medical Record Number: [id number] ?Patient Account Number: [id number] ?Date of Birth/Sex: 1955-09-18 (66 y.o. M) ?Treating RN: Angelina Pih ?Primary Care Provider: [name] Other Clinician: ?Referring Provider: [name] ?Treating Provider/Extender: Allen Derry ?Weeks in Treatment: 12 ?Information Obtained from: Patient ?Chief Complaint ?Left heel ulcer ?Electronic Signature(s) ?Signed: 10/31/2021 9:49:50 AM By: Lenda Kelp PA-C ?Entered By: Lenda Kelp on 10/31/2021 09:49:50 ?Rarick, Akiem W. ([id number]) ?-------------------------------------------------------------------------------- ?Debridement Details ?Patient Name: [name] ?Date of Service: 10/31/2021 9:45 AM ?Medical Record Number: [id number] ?Patient Account Number: [id number] ?Date of Birth/Sex: [birth year] (66 y.o. M) ?Treating RN: Angelina Pih ?Primary Care Provider: [name] Other Clinician: ?Referring Provider: [name] ?Treating Provider/Extender: Allen Derry ?Weeks in Treatment: 12 ?Debridement Performed for ?Wound #1 Left Calcaneus ?Assessment: ?Performed By: Physician Nelida Meuse., PA-C ?Debridement Type: Chemical/Enzymatic/Mechanical ?Agent Used: Santyl ?Severity of Tissue Pre Debridement: Fat layer exposed ?Level of Consciousness (Pre- ?Awake and Alert ?procedure): ?Pre-procedure Verification/Time Out ?Yes - 10:26 ?Taken: ?Pain Control: Lidocaine 4% Topical Solution ?Bleeding: None ?Response to Treatment: Procedure was tolerated well ?Level of Consciousness (Post- ?Awake and Alert ?procedure): ?Post Debridement Measurements of Total Wound ?Length: (cm) 4.6 ?Width: (cm) 3.6 ?Depth: (cm) 0.2 ?Volume: (cm?) 2.601 ?Character of Wound/Ulcer Post Debridement: Stable ?Severity of Tissue Post Debridement: Fat layer exposed ?Post Procedure Diagnosis ?Same as  Pre-procedure ?Electronic Signature(s) ?Signed: 10/31/2021 4:33:56 PM By: Angelina Pih ?Signed: 10/31/2021 5:23:27 PM By: Lenda Kelp PA-C ?Entered By: Angelina Pih on 10/31/2021 10:26:53 ?Dinovo, Gottlieb W. ([id number]) ?-------------------------------------------------------------------------------- ?HPI Details ?Patient Name: [name] ?Date of Service: 10/31/2021 9:45 AM ?Medical Record Number: [id number] ?Patient Account Number: [id number] ?Date of Birth/Sex: [birth year] (66 y.o. M) ?Treating RN: Angelina Pih ?Primary Care Provider: [name] Other Clinician: ?Referring Provider: [name] ?Treating Provider/Extender: Allen Derry ?Weeks in Treatment: 12 ?History of Present Illness ?HPI Description: 08/08/2021 upon evaluation today patient appears to be doing somewhat poorly in regard to a heel ulcer that began as a result ?of the patient tells me him trying to get his foot into his shoes that were somewhat tight. This was around 4 January. He was using a shoehorn ?fairly aggressively and thinks that he may have caused the bruising at that time. Fortunately there does not appear to be any signs of infection at ?this point. Nonetheless he does have some definite necrotic tissue. This can need to be cleared away. Nonetheless unfortunately his ABIs were ?noncompressible we do not have clearance in my opinion therefore to go forward with an aggressive sharp debridement. ?Patient does have diabetes mellitus type 2 with the most recent hemoglobin A1c of 5.2 and that was on June 2022. He also has a history of ?chronic venous insufficiency, peripheral vascular disease, and hypertension. Obviously with regard to the peripheral vascular disease we will ?continue to send him to vascular to quantify his ABIs and TBI's. ?08/22/2021 upon evaluation today patient appears to be doing well with regard to his wound. This is definitely showing some signs of improvement ?which is great news and overall very pleased with  where things stand today. There does not appear to be any evidence of active infection locally ?nor systemically which is great news and overall I feel like that this is showing signs of improvement. It is definitely much less irritated as ?compared to last week. I am very pleased in that regard I am good to  be able to get a lot of this dry skin off today as well which is great news. ?09/05/2021 upon evaluation today patient's heel actually showing signs of some improvement though there is still a bit of eschar noted in the ?center part of the wound. I do believe this is going require some sharp debridement to clear this away. The Iodosorb just is not quite cutting it. ?Fortunately there does not appear to be any signs of active infection at this time locally nor systemically. ?09/12/2021 upon evaluation today patient appears to be doing well with regard to his wound. He has been tolerating the dressing changes I do ?believe that clearing off the eschar last week has been to help there is still some necrotic tissue removed today overall he seems to be doing quite ?well however. ?09/20/2021 upon evaluation today patient appears to be doing well with regard to his wound he does have some necrotic tissue noted on the ?surface of the wound but I do feel like we are getting better here definitely making good progress. I do not see any signs of infection which is ?great news and overall I think that we are on the right track. ?09-26-2021 upon evaluation today patient appears to be doing well with regard to his heel ulcer. This is actually measuring smaller and looking ?better today. Fortunately I do not see any evidence of active infection locally or systemically which is great news. ?10-03-2021 upon evaluation today patient appears to be doing well currently in regard to his heel for the most part although he does have a little ?bit of what appears to be bruising in the base of the wound. I am not sure exactly where to get the  pressure from but this very likely is happening ?when he is walking he tells me has been wearing some other shoes been a little bit more on his feet than normal. Fortunately I do not see any ?evidence of active infection at this time. I have contemplated put him in a different type of shoe or even a total contact cast to be honest. With ?that being said his balance is not good and I therefore have some concerns in that regard. ?10-17-2021 upon evaluation today patient appears to be doing poorly in regard to his heel. Again this is showing signs of significant deep tissue ?injury which is bringing into question whether or not he may actually have something going on here which is more related to his blood flow. We ?have previously done arterial study though to be honest with ABIs right at 1 and noncompressible which tends to make me think this may be ?falsely elevated coupled with TBI's which were at 0.5 with biphasic waveforms I am not certain he is getting the blood flow needed to completely ?heal this wound. I discussed with him today that he may need to see vascular in order to discuss options for her further angiogram/evaluation in ?general. I would at least leave it up to them as to whether they feel the angiogram is necessary or not. Nonetheless I do believe pressure is also ?playing a component to this he does tell me he started sleeping with his heel on the bed not raised underneath the pillow already this was dark ?and discolored that may have been what triggered this to get worse I am not really sure. ?5/8; the wound seems to have less of a deep tissue injury but there is still eschar. Scant amount of purulent drainage some erythema medially ?extending  up the heel. He has neuropathy no pain no systemic illness. ?Culture that we did last time showed moderate Proteus, moderate Enterococcus and moderate Pseudomonas. The Enterococcus will require ?ampicillin so he will require 2 antibiotics. He is using Santyl to  the wound ?10-31-2021 upon evaluation today patient appears to be doing well with regard to his wound all things considered. There is still necrotic tissue ?noted in the central portion of the wound which is not what we wa

## 2021-10-31 NOTE — Progress Notes (Signed)
Spooner, Worthington W. ([id number]) ?Visit Report for 10/31/2021 ?Arrival Information Details ?Patient Name: [name], [name] ?Date of Service: 10/31/2021 9:45 AM ?Medical Record Number: [id number] ?Patient Account Number: [id number] ?Date of Birth/Sex: 22-Feb-1956 (66 y.o. M) ?Treating RN: Levora Dredge ?Primary Care Antonin Meininger: Alysia Penna Other Clinician: ?Referring Tashica Provencio: Alysia Penna ?Treating Naseem Varden/Extender: Jeri Cos ?Weeks in Treatment: 12 ?Visit Information History Since Last Visit ?Added or deleted any medications: Yes ?Patient Arrived: [name] ?Any new allergies or adverse reactions: No ?Arrival Time: 09:43 ?Had a fall or experienced change in No ?Accompanied By: self ?activities of daily living that may affect ?Transfer Assistance: None ?risk of falls: ?Patient Identification Verified: Yes ?Hospitalized since last visit: No ?Secondary Verification Process Completed: Yes ?Has Dressing in Place as Prescribed: Yes ?Patient Has Alerts: Yes ?Pain Present Now: No ?Patient Alerts: ABI R >1.0 Ethel TBI 0.50 ?ABI L 0.98 ?Electronic Signature(s) ?Signed: 10/31/2021 4:33:56 PM By: Levora Dredge ?Entered By: Levora Dredge on 10/31/2021 09:45:52 ?Viar, Kennard W. ([id number]) ?-------------------------------------------------------------------------------- ?Clinic Level of Care Assessment Details ?Patient Name: [name], [name] ?Date of Service: 10/31/2021 9:45 AM ?Medical Record Number: [id number] ?Patient Account Number: [id number] ?Date of Birth/Sex: 1955-07-28 (66 y.o. M) ?Treating RN: Levora Dredge ?Primary Care Trinh Sanjose: Alysia Penna Other Clinician: ?Referring Kreg Earhart: Alysia Penna ?Treating Rainey Kahrs/Extender: Jeri Cos ?Weeks in Treatment: 12 ?Clinic Level of Care Assessment Items ?TOOL 1 Quantity Score ?[]  - Use when EandM and Procedure is performed on INITIAL visit 0 ?ASSESSMENTS - Nursing Assessment / Reassessment ?[]  - General Physical Exam (combine w/ comprehensive assessment (listed just below) when performed on  new ?0 ?pt. evals) ?[]  - 0 ?Comprehensive Assessment (HX, ROS, Risk Assessments, Wounds Hx, etc.) ?ASSESSMENTS - Wound and Skin Assessment / Reassessment ?[]  - Dermatologic / Skin Assessment (not related to wound area) 0 ?ASSESSMENTS - Ostomy and/or Continence Assessment and Care ?[]  - Incontinence Assessment and Management 0 ?[]  - 0 ?Ostomy Care Assessment and Management (repouching, etc.) ?PROCESS - Coordination of Care ?[]  - Simple Patient / Family Education for ongoing care 0 ?[]  - 0 ?Complex (extensive) Patient / Family Education for ongoing care ?[]  - 0 ?Staff obtains Consents, Records, Test Results / Process Orders ?[]  - 0 ?Staff telephones HHA, Nursing Homes / Clarify orders / etc ?[]  - 0 ?Routine Transfer to another Facility (non-emergent condition) ?[]  - 0 ?Routine Hospital Admission (non-emergent condition) ?[]  - 0 ?New Admissions / Biomedical engineer / Ordering NPWT, Apligraf, etc. ?[]  - 0 ?Emergency Hospital Admission (emergent condition) ?PROCESS - Special Needs ?[]  - Pediatric / Minor Patient Management 0 ?[]  - 0 ?Isolation Patient Management ?[]  - 0 ?Hearing / Language / Visual special needs ?[]  - 0 ?Assessment of Community assistance (transportation, D/C planning, etc.) ?[]  - 0 ?Additional assistance / Altered mentation ?[]  - 0 ?Support Surface(s) Assessment (bed, cushion, seat, etc.) ?INTERVENTIONS - Miscellaneous ?[]  - External ear exam 0 ?[]  - 0 ?Patient Transfer (multiple staff / Civil Service fast streamer / Similar devices) ?[]  - 0 ?Simple Staple / Suture removal (25 or less) ?[]  - 0 ?Complex Staple / Suture removal (26 or more) ?[]  - 0 ?Hypo/Hyperglycemic Management (do not check if billed separately) ?[]  - 0 ?Ankle / Brachial Index (ABI) - do not check if billed separately ?Has the patient been seen at the hospital within the last three years: Yes ?Total Score: 0 ?Level Of Care: ____ ?Pierron, Sebastyan W. ([id number]) ?Electronic Signature(s) ?Signed: 10/31/2021 4:33:56 PM By: Levora Dredge ?Entered By:  Levora Dredge on 10/31/2021 10:27:28 ?Borello, Alphonso W. ([id number]) ?-------------------------------------------------------------------------------- ?Encounter Discharge Information  Details ?Patient Name: [name], [name] ?Date of Service: 10/31/2021 9:45 AM ?Medical Record Number: [id number] ?Patient Account Number: [id number] ?Date of Birth/Sex: September 18, 1955 (66 y.o. M) ?Treating RN: Levora Dredge ?Primary Care Keli Buehner: Alysia Penna Other Clinician: ?Referring Alaster Asfaw: Alysia Penna ?Treating Rendell Thivierge/Extender: Jeri Cos ?Weeks in Treatment: 12 ?Encounter Discharge Information Items Post Procedure Vitals ?Discharge Condition: Stable ?Temperature (?F): 97.8 ?Ambulatory Status: [name] ?Pulse (bpm): 75 ?Discharge Destination: Home ?Respiratory Rate (breaths/min): 18 ?Transportation: Private Auto ?Blood Pressure (mmHg): 159/78 ?Accompanied By: self ?Schedule Follow-up Appointment: Yes ?Clinical Summary of Care: Patient Declined ?Electronic Signature(s) ?Signed: 10/31/2021 4:33:56 PM By: Levora Dredge ?Entered By: Levora Dredge on 10/31/2021 10:28:32 ?Ardito, Keshun W. ([id number]) ?-------------------------------------------------------------------------------- ?Lower Extremity Assessment Details ?Patient Name: [name], [name] ?Date of Service: 10/31/2021 9:45 AM ?Medical Record Number: [id number] ?Patient Account Number: [id number] ?Date of Birth/Sex: [birth year] (66 y.o. M) ?Treating RN: Levora Dredge ?Primary Care Naw Lasala: Alysia Penna Other Clinician: ?Referring Zackaria Burkey: Alysia Penna ?Treating Emerick Weatherly/Extender: Jeri Cos ?Weeks in Treatment: 12 ?Edema Assessment ?Assessed: [Left: No] [Right: No] ?Edema: [Left: Ye] [Right: s] ?Calf ?Left: Right: ?Point of Measurement: 32 cm From Medial Instep 37 cm ?Ankle ?Left: Right: ?Point of Measurement: 10 cm From Medial Instep 24.8 cm ?Notes ?pedal pulse difficult to palpate ?Electronic Signature(s) ?Signed: 10/31/2021 4:33:56 PM By: Levora Dredge ?Entered By: Levora Dredge on  10/31/2021 09:59:14 ?Moster, Zacherie W. ([id number]) ?-------------------------------------------------------------------------------- ?Multi Wound Chart Details ?Patient Name: [name], [name] ?Date of Service: 10/31/2021 9:45 AM ?Medical Record Number: [id number] ?Patient Account Number: [id number] ?Date of Birth/Sex: [birth year] (66 y.o. M) ?Treating RN: Levora Dredge ?Primary Care Nela Bascom: Alysia Penna Other Clinician: ?Referring Michaele Amundson: Alysia Penna ?Treating Aldeen Riga/Extender: Jeri Cos ?Weeks in Treatment: 12 ?Vital Signs ?Height(in): 68 ?Pulse(bpm): 75 ?Weight(lbs): 220 ?Blood Pressure(mmHg): 159/78 ?Body Mass Index(BMI): 33.4 ?Temperature(??F): 97.8 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 ?Comorbid History: Cataracts, Hypertension, Type II N/A N/A ?Diabetes, Neuropathy ?Date Acquired: 06/19/2021 N/A N/A ?Weeks of Treatment: 12 N/A N/A ?Wound Status: Open N/A N/A ?Wound Recurrence: No N/A N/A ?Measurements L x W x D (cm) 4.6x3.6x0.2 N/A N/A ?Area (cm?) : 13.006 N/A N/A ?Volume (cm?) : 2.601 N/A N/A ?% Reduction in Area: -473.00% N/A N/A ?% Reduction in Volume: -1045.80% N/A N/A ?Classification: Grade 2 N/A N/A ?Exudate Amount: Medium N/A N/A ?Exudate Type: Serosanguineous N/A N/A ?Exudate Color: red, brown N/A N/A ?Granulation Amount: Small (1-33%) N/A N/A ?Granulation Quality: Red N/A N/A ?Necrotic Amount: Large (67-100%) N/A N/A ?Necrotic Tissue: Eschar, Adherent Slough N/A N/A ?Exposed Structures: ?Fat Layer (Subcutaneous Tissue): N/A N/A ?Yes ?Epithelialization: Small (1-33%) N/A N/A ?Treatment Notes ?Electronic Signature(s) ?Signed: 10/31/2021 4:33:56 PM By: Levora Dredge ?Entered By: Levora Dredge on 10/31/2021 10:23:44 ?Bannan, Augie W.  ([id number]) ?-------------------------------------------------------------------------------- ?Multi-Disciplinary Care Plan Details ?Patient Name: [name], [name] ?Date of Service: 10/31/2021 9:45 AM ?Medical Record Number: [id number] ?Patient Account Number: [id number] ?Date of Birth/Sex: [birth year] (66 y.o. M) ?Treating RN:

## 2021-11-03 ENCOUNTER — Encounter (INDEPENDENT_AMBULATORY_CARE_PROVIDER_SITE_OTHER): Payer: No Typology Code available for payment source | Admitting: Vascular Surgery

## 2021-11-03 ENCOUNTER — Ambulatory Visit
Admission: RE | Admit: 2021-11-03 | Discharge: 2021-11-03 | Disposition: A | Discharge status: Home / Self Care | Payer: No Typology Code available for payment source | Source: Ambulatory Visit | Attending: Internal Medicine | Admitting: Internal Medicine

## 2021-11-03 ENCOUNTER — Ambulatory Visit (INDEPENDENT_AMBULATORY_CARE_PROVIDER_SITE_OTHER): Payer: No Typology Code available for payment source | Admitting: Vascular Surgery

## 2021-11-03 VITALS — BP 173/83 | HR 90 | Resp 16 | Ht 68.0 in | Wt 213.0 lb

## 2021-11-03 DIAGNOSIS — I7025 Atherosclerosis of native arteries of other extremities with ulceration: Secondary | ICD-10-CM | POA: Diagnosis not present

## 2021-11-03 DIAGNOSIS — E1140 Type 2 diabetes mellitus with diabetic neuropathy, unspecified: Secondary | ICD-10-CM | POA: Diagnosis not present

## 2021-11-03 DIAGNOSIS — I10 Essential (primary) hypertension: Secondary | ICD-10-CM | POA: Diagnosis not present

## 2021-11-03 DIAGNOSIS — L97422 Non-pressure chronic ulcer of left heel and midfoot with fat layer exposed: Secondary | ICD-10-CM | POA: Diagnosis present

## 2021-11-03 DIAGNOSIS — Z794 Long term (current) use of insulin: Secondary | ICD-10-CM | POA: Diagnosis not present

## 2021-11-03 MED ORDER — GADOBUTROL 1 MMOL/ML IV SOLN
9.0000 mL | Freq: Once | INTRAVENOUS | Status: AC | PRN
  Administered 2021-11-03: 9 mL via INTRAVENOUS

## 2021-11-03 NOTE — Progress Notes (Signed)
MRN : [id number]  [name] is a 66 y.o. (1956/01/02) male who presents with chief complaint of check circulation.  History of Present Illness:   The patient is seen for evaluation of painful lower extremities and diminished pulses associated with ulceration of the foot.  The patient notes the ulcer has been present for multiple weeks and has not been improving.  It is very painful and has had some drainage.  No specific history of trauma noted by the patient.  The patient denies fever or chills.  the patient does have diabetes which has been difficult to control.  Patient notes prior to the ulcer developing the extremities were painful particularly with walking.  The patient denies rest pain or dangling of an extremity off the side of the bed during the night for relief. No prior interventions or surgeries.  No history of back problems or DJD of the lumbar sacral spine.   The patient denies amaurosis fugax or recent TIA symptoms. There are no recent neurological changes noted. The patient denies history of DVT, PE or superficial thrombophlebitis. The patient denies recent episodes of angina or shortness of breath.    ABI dated 08/17/2021 show Rt=1.24(TBI=0.50) and LT=0.98(TBI=0.51)  No outpatient medications have been marked as taking for the 11/03/21 encounter (Appointment) with Delana Meyer, Dolores Lory, MD.    Past Medical History:  Diagnosis Date   Diabetes mellitus    Hematospermia    Hypertension    Nephrolithiasis 02/07/07   Viral meningitis     Past Surgical History:  Procedure Laterality Date   CHOLECYSTECTOMY  12-07-14   laparoscopic, per Dr. Rush Farmer     Social History Social History   Tobacco Use   Smoking status: Former   Smokeless tobacco: Never   Tobacco comments:    22 yrs ago   Substance Use Topics   Alcohol use: No    Alcohol/week: 0.0 standard drinks   Drug use: No    Family History Family History  Problem Relation Age of Onset   Rheum  arthritis Mother    Diabetes Father    Heart disease Father    Cholecystitis Sister    Cholecystitis Brother    Diabetes Brother    Diabetes Brother    Parkinson's disease Brother    Heart disease Sister    Diabetes Sister    Cholecystitis Sister    Diabetes Sister    Arthritis Other    Hypertension Other    Stroke Other    Coronary artery disease Other    Birth defects Neg Hx     No Known Allergies   REVIEW OF SYSTEMS (Negative unless checked)  Constitutional: [] Weight loss  [] Fever  [] Chills Cardiac: [] Chest pain   [] Chest pressure   [] Palpitations   [] Shortness of breath when laying flat   [] Shortness of breath with exertion. Vascular:  [x] Pain in legs with walking   [] Pain in legs at rest  [] History of DVT   [] Phlebitis   [] Swelling in legs   [] Varicose veins   [x] Non-healing ulcers Pulmonary:   [] Uses home oxygen   [] Productive cough   [] Hemoptysis   [] Wheeze  [] COPD   [] Asthma Neurologic:  [] Dizziness   [] Seizures   [] History of stroke   [] History of TIA  [] Aphasia   [] Vissual changes   [] Weakness or numbness in arm   [] Weakness or numbness in leg Musculoskeletal:   [] Joint swelling   [] Joint pain   [] Low back pain Hematologic:  [] Easy bruising  []   Easy bleeding   [] Hypercoagulable state   [] Anemic Gastrointestinal:  [] Diarrhea   [] Vomiting  [] Gastroesophageal reflux/heartburn   [] Difficulty swallowing. Genitourinary:  [] Chronic kidney disease   [] Difficult urination  [] Frequent urination   [] Blood in urine Skin:  [] Rashes   [x] Ulcers  Psychological:  [] History of anxiety   []  History of major depression.  Physical Examination  There were no vitals filed for this visit. There is no height or weight on file to calculate BMI. Gen: WD/WN, NAD Head: [place]/AT, No temporalis wasting.  Ear/Nose/Throat: Hearing grossly intact, nares w/o erythema or drainage Eyes: PER, EOMI, sclera nonicteric.  Neck: Supple, no masses.  No bruit or JVD.  Pulmonary:  Good air movement, no audible  wheezing, no use of accessory muscles.  Cardiac: RRR, normal S1, S2, no Murmurs. Vascular:  mild trophic changes, left heel open wound Vessel Right Left  Radial Palpable Palpable  PT Not Palpable Not Palpable  DP Not Palpable Not Palpable  Gastrointestinal: soft, non-distended. No guarding/no peritoneal signs.  Musculoskeletal: M/S 5/5 throughout.  No visible deformity.  Neurologic: CN 2-12 intact. Pain and light touch intact in extremities.  Symmetrical.  Speech is fluent. Motor exam as listed above. Psychiatric: Judgment intact, Mood & affect appropriate for pt's clinical situation. Dermatologic: No rashes or ulcers noted.  No changes consistent with cellulitis.   CBC Lab Results  Component Value Date   WBC 8.7 08/23/2021   HGB 13.9 08/23/2021   HCT 39.4 08/23/2021   MCV 95.1 08/23/2021   PLT 165.0 08/23/2021    BMET    Component Value Date/Time   NA 141 08/23/2021 1030   K 4.8 08/23/2021 1030   CL 104 08/23/2021 1030   CO2 30 08/23/2021 1030   GLUCOSE 112 (H) 08/23/2021 1030   BUN 25 (H) 08/23/2021 1030   CREATININE 0.97 08/23/2021 1030   CREATININE 1.31 (H) 03/15/2020 1700   CALCIUM 9.4 08/23/2021 1030   GFRNONAA >60 01/10/2020 1107   GFRAA >60 01/10/2020 1107   CrCl cannot be calculated (Patient's most recent lab result is older than the maximum 21 days allowed.).  COAG No results found for: INR, PROTIME  Radiology DG Foot Complete Left  Result Date: 10/17/2021 CLINICAL DATA:  Non-healing wound posterior aspect left calcaneus x 2-3 months. Clinical concern for infection. EXAM: LEFT FOOT - COMPLETE 3+ VIEW COMPARISON:  None. FINDINGS: There appears to be a superficial wound at the posterior aspect of the heel soft tissues. No cortical erosion is seen within the adjacent calcaneus. Minimal chronic enthesopathic change at the Achilles and plantar fascia insertions on the calcaneus. Mild-to-moderate talonavicular, second through fifth tarsometatarsal, and  interphalangeal degenerative joint space narrowing. Minimal lateral spurring of the great toe metatarsal head. No acute fracture or dislocation. Vascular calcifications are noted. IMPRESSION: No radiographic evidence of acute osteomyelitis. Superficial wound at the posterior aspect of the heel. Electronically Signed   By: Yvonne Kendall M.D.   On: 10/17/2021 12:42     Assessment/Plan 1. Atherosclerosis of native arteries of the extremities with ulceration (HCC)  Recommend:  The patient has evidence of mild atherosclerotic changes of both lower extremities associated with ulceration and tissue loss of the left heel.  This represents a possible limb threatening ischemia and places the patient at the risk for left limb loss.  I discussed angiography with the patient.  At this point he feels it is better.  We have agreed that I will see him back in one month and readdress angiography at that time.  A total of 50 minutes was spent with this patient and greater than 50% was spent in counseling and coordination of care with the patient.  Discussion included the treatment options for vascular disease including indications for surgery and intervention.  Also discussed is the appropriate timing of treatment.  In addition medical therapy was discussed.     2. Essential hypertension Continue antihypertensive medications as already ordered, these medications have been reviewed and there are no changes at this time.   3. Type 2 diabetes mellitus with diabetic neuropathy, with [place]-term current use of insulin ([place]) Continue hypoglycemic medications as already ordered, these medications have been reviewed and there are no changes at this time.  Hgb A1C to be monitored as already arranged by primary service     Hortencia Pilar, MD  11/03/2021 8:32 AM

## 2021-11-06 ENCOUNTER — Encounter (INDEPENDENT_AMBULATORY_CARE_PROVIDER_SITE_OTHER): Payer: Self-pay | Admitting: Vascular Surgery

## 2021-11-07 ENCOUNTER — Encounter: Payer: No Typology Code available for payment source | Admitting: Physician Assistant

## 2021-11-07 DIAGNOSIS — E11621 Type 2 diabetes mellitus with foot ulcer: Secondary | ICD-10-CM | POA: Diagnosis not present

## 2021-11-07 NOTE — Progress Notes (Signed)
[name], [name] ([id number]) Visit Report for 11/07/2021 Arrival Information Details Patient Name: [name], [name] Date of Service: 11/07/2021 9:00 AM Medical Record Number: [id number] Patient Account Number: [id number] Date of Birth/Sex: [birth year] (66 y.o. M) Treating RN: Alycia Rossetti Primary Care Junie Avilla: Alysia Penna Other Clinician: Referring Dayelin Balducci: Alysia Penna Treating Christion Leonhard/Extender: Skipper Cliche in Treatment: 24 Visit Information History Since Last Visit Added or deleted any medications: No Patient Arrived: [name] Any new allergies or adverse reactions: No Arrival Time: 09:02 Hospitalized since last visit: No Accompanied By: self Has Dressing in Place as Prescribed: Yes Transfer Assistance: None Pain Present Now: No Patient Identification Verified: Yes Secondary Verification Process Completed: Yes Patient Has Alerts: Yes Patient Alerts: ABI R >1.0 [place] TBI 0.50 ABI L 0.98 Electronic Signature(s) Signed: 11/07/2021 4:29:51 PM By: Alycia Rossetti Entered By: Alycia Rossetti on 11/07/2021 09:03:35 Mullendore, Jeffrie W. ([id number]) -------------------------------------------------------------------------------- Encounter Discharge Information Details Patient Name: [name], [name] Date of Service: 11/07/2021 9:00 AM Medical Record Number: [id number] Patient Account Number: [id number] Date of Birth/Sex: [birth year] (66 y.o. M) Treating RN: Alycia Rossetti Primary Care Braxston Quinter: Alysia Penna Other Clinician: Referring Cataleya Cristina: Alysia Penna Treating Keen Ewalt/Extender: Skipper Cliche in Treatment: 13 Encounter Discharge Information Items Post Procedure Vitals Discharge Condition: Stable Temperature (F): 97.8 Ambulatory Status: Cane Pulse (bpm): 74 Discharge Destination: Home Respiratory Rate (breaths/min): 18 Transportation: Private Auto Blood Pressure (mmHg): 181/89 Accompanied By: self Schedule Follow-up Appointment: Yes Clinical Summary of Care: Patient Declined Electronic  Signature(s) Signed: 11/07/2021 4:29:51 PM By: Alycia Rossetti Entered By: Alycia Rossetti on 11/07/2021 09:31:54 Eisen, Arsenio W. ([id number]) -------------------------------------------------------------------------------- Lower Extremity Assessment Details Patient Name: Payment, [name] Date of Service: 11/07/2021 9:00 AM Medical Record Number: [id number] Patient Account Number: [id number] Date of Birth/Sex: [birth year] (66 y.o. M) Treating RN: Alycia Rossetti Primary Care Keenan Dimitrov: Alysia Penna Other Clinician: Referring Roni Friberg: Alysia Penna Treating Jaide Hillenburg/Extender: Jeri Cos Weeks in Treatment: 13 Edema Assessment Assessed: Shirlyn Goltz: Yes] Patrice Paradise: No] Edema: [Left: N] [Right: o] Vascular Assessment Pulses: Dorsalis Pedis Palpable: [Left:Yes] Electronic Signature(s) Signed: 11/07/2021 4:29:51 PM By: Alycia Rossetti Entered By: Alycia Rossetti on 11/07/2021 09:16:37 Peeples, Dymond W. ([id number]) -------------------------------------------------------------------------------- Multi Wound Chart Details Patient Name: [name], [name] Date of Service: 11/07/2021 9:00 AM Medical Record Number: [id number] Patient Account Number: [id number] Date of Birth/Sex: [birth year] (66 y.o. M) Treating RN: Alycia Rossetti Primary Care Verdis Bassette: Alysia Penna Other Clinician: Referring Skylene Deremer: Alysia Penna Treating Jani Moronta/Extender: Skipper Cliche in Treatment: 13 Vital Signs Height(in): 68 Pulse(bpm): 74 Weight(lbs): 220 Blood Pressure(mmHg): 181/89 Body Mass Index(BMI): 33.4 Temperature(F): 97.8 Respiratory Rate(breaths/min): 18 Photos: [N/A:N/A] Wound Location: Left Calcaneus N/A N/A Wounding Event: Pressure Injury N/A N/A Primary Etiology: Diabetic Wound/Ulcer of the Lower N/A N/A Extremity Date Acquired: 06/19/2021 N/A N/A Weeks of Treatment: 13 N/A N/A Wound Status: Open N/A N/A Wound Recurrence: No N/A N/A Measurements L x W x D (cm) 3.8x3.5x0.2 N/A N/A Area (cm) : 10.446 N/A N/A Volume (cm) : 2.089  N/A N/A % Reduction in Area: -360.20% N/A N/A % Reduction in Volume: -820.30% N/A N/A Classification: Grade 2 N/A N/A Exudate Amount: Medium N/A N/A Exudate Type: Serosanguineous N/A N/A Exudate Color: red, brown N/A N/A Treatment Notes Electronic Signature(s) Signed: 11/07/2021 4:29:51 PM By: Alycia Rossetti Entered By: Alycia Rossetti on 11/07/2021 09:25:25 Deiss, Savir W. ([id number]) -------------------------------------------------------------------------------- [place] Details Patient Name: [name], [name] Date of Service: 11/07/2021 9:00 AM Medical Record Number: [id number] Patient Account Number: [id number] Date of Birth/Sex: 1956/02/04 (66 y.o. M) Treating RN: Alycia Rossetti Primary  Care Maddy Graham: Alysia Penna Other Clinician: Referring Nathyn Luiz: Alysia Penna Treating Racine Erby/Extender: Skipper Cliche in Treatment: 13 Active Inactive Wound/Skin Impairment Nursing Diagnoses: Impaired tissue integrity Knowledge deficit related to ulceration/compromised skin integrity Goals: Ulcer/skin breakdown will have a volume reduction of 30% by week 4 Date Initiated: 08/08/2021 Target Resolution Date: 09/05/2021 Goal Status: Active Ulcer/skin breakdown will have a volume reduction of 50% by week 8 Date Initiated: 08/08/2021 Target Resolution Date: 10/03/2021 Goal Status: Active Ulcer/skin breakdown will have a volume reduction of 80% by week 12 Date Initiated: 08/08/2021 Target Resolution Date: 10/31/2021 Goal Status: Active Ulcer/skin breakdown will heal within 14 weeks Date Initiated: 08/08/2021 Target Resolution Date: 11/14/2021 Goal Status: Active Interventions: Assess patient/caregiver ability to obtain necessary supplies Assess patient/caregiver ability to perform ulcer/skin care regimen upon admission and as needed Assess ulceration(s) every visit Provide education on ulcer and skin care Notes: Electronic Signature(s) Signed: 11/07/2021 4:29:51 PM By: Alycia Rossetti Entered By: Alycia Rossetti on 11/07/2021 09:25:15 [name], Mykell W. ([id number]) -------------------------------------------------------------------------------- Pain Assessment Details Patient Name: Macnaughton, [name] Date of Service: 11/07/2021 9:00 AM Medical Record Number: [id number] Patient Account Number: [id number] Date of Birth/Sex: 10-Mar-1956 (66 y.o. M) Treating RN: Alycia Rossetti Primary Care Fawaz Borquez: Alysia Penna Other Clinician: Referring Areli Frary: Alysia Penna Treating Laela Deviney/Extender: Skipper Cliche in Treatment: 13 Active Problems Location of Pain Severity and Description of Pain Patient Has Paino No Site Locations Pain Management and Medication Current Pain Management: Electronic Signature(s) Signed: 11/07/2021 4:29:51 PM By: Alycia Rossetti Entered By: Alycia Rossetti on 11/07/2021 09:05:03 Provence, Catcher W. ([id number]) -------------------------------------------------------------------------------- Patient/Caregiver Education Details Patient Name: Kilbourne, [name] Date of Service: 11/07/2021 9:00 AM Medical Record Number: [id number] Patient Account Number: [id number] Date of Birth/Gender: [birth year] (66 y.o. M) Treating RN: Alycia Rossetti Primary Care Physician: Alysia Penna Other Clinician: Referring Physician: Alysia Penna Treating Physician/Extender: Skipper Cliche in Treatment: 13 Education Assessment Education Provided To: Patient Education Topics Provided Wound/Skin Impairment: Handouts: Caring for Your Ulcer, Other: wound care as prescribed Methods: Demonstration, Explain/Verbal Responses: State content correctly Electronic Signature(s) Signed: 11/07/2021 4:29:51 PM By: Alycia Rossetti Entered By: Alycia Rossetti on 11/07/2021 09:30:01 Kleinfelter, Clare W. ([id number]) -------------------------------------------------------------------------------- Wound Assessment Details Patient Name: Schubach, [name] Date of Service: 11/07/2021 9:00 AM Medical Record Number:  [id number] Patient Account Number: [id number] Date of Birth/Sex: May 25, 1956 (66 y.o. M) Treating RN: Alycia Rossetti Primary Care Shanessa Hodak: Alysia Penna Other Clinician: Referring Krysia Zahradnik: Alysia Penna Treating Warrene Kapfer/Extender: Jeri Cos Weeks in Treatment: 13 Wound Status Wound Number: 1 Primary Etiology: Diabetic Wound/Ulcer of the Lower Extremity Wound Location: Left Calcaneus Wound Status: Open Wounding Event: Pressure Injury Date Acquired: 06/19/2021 Weeks Of Treatment: 13 Clustered Wound: No Photos Photo Uploaded By: Alycia Rossetti on 11/07/2021 09:19:17 Wound Measurements Length: (cm) 3.8 Width: (cm) 3.5 Depth: (cm) 0.2 Area: (cm) 10.446 Volume: (cm) 2.089 % Reduction in Area: -360.2% % Reduction in Volume: -820.3% Wound Description Classification: Grade 2 Exudate Amount: Medium Exudate Type: Serosanguineous Exudate Color: red, brown Treatment Notes Wound #1 (Calcaneus) Wound Laterality: Left Cleanser Peri-Wound Care Topical Santyl Collagenase Ointment, 30 (gm), tube Discharge Instruction: apply nickel thick to wound bed only Primary Dressing Gauze Discharge Instruction: As directed moistened with saline Secondary Dressing ABD Pad 5x9 (in/in) Rosenzweig, Atwood W. ([id number]) Discharge Instruction: Cover with ABD pad Kerlix 4.5 x 4.1 (in/yd) Discharge Instruction: Apply Kerlix 4.5 x 4.1 (in/yd) as instructed Secured With Medipore Tape - 39M Medipore H Soft Cloth Surgical Tape, 2x2 (in/yd) Compression Wrap Compression Stockings Add-Ons Electronic Signature(s) Signed: 11/07/2021 4:29:51  PM By: Alycia Rossetti Entered By: Alycia Rossetti on 11/07/2021 09:14:14 Seng, Johntay W. ([id number]) -------------------------------------------------------------------------------- Vitals Details Patient Name: Dowers, [name] Date of Service: 11/07/2021 9:00 AM Medical Record Number: [id number] Patient Account Number: [id number] Date of Birth/Sex: 12/22/1955 (66 y.o. M) Treating RN: Alycia Rossetti Primary Care Sheran Newstrom: Alysia Penna Other Clinician: Referring Maris Bena: Alysia Penna Treating Jozi Malachi/Extender: Skipper Cliche in Treatment: 13 Vital Signs Time Taken: 09:04 Temperature (F): 97.8 Height (in): 68 Pulse (bpm): 74 Weight (lbs): 220 Respiratory Rate (breaths/min): 18 Body Mass Index (BMI): 33.4 Blood Pressure (mmHg): 181/89 Reference Range: 80 - 120 mg / dl Electronic Signature(s) Signed: 11/07/2021 4:29:51 PM By: Alycia Rossetti Entered By: Alycia Rossetti on 11/07/2021 09:04:49

## 2021-11-07 NOTE — Progress Notes (Addendum)
Nichter, Mitchell Lacretia Nicks ([id number]) Visit Report for 11/07/2021 Chief Complaint Document Details Patient Name: Bossard, Jeff W. Date of Service: 11/07/2021 9:00 AM Medical Record Number: [id number] Patient Account Number: [id number] Date of Birth/Sex: April 08, 1956 (66 y.o. M) Treating RN: Huel Coventry Primary Care Provider: [name] Other Clinician: Referring Provider: [name] Treating Provider/Extender: Rowan Blase in Treatment: 13 Information Obtained from: Patient Chief Complaint Left heel ulcer Electronic Signature(s) Signed: 11/07/2021 9:18:19 AM By: Lenda Kelp PA-C Entered By: Lenda Kelp on 11/07/2021 09:18:19 Schreiner, Dhilan W. ([id number]) -------------------------------------------------------------------------------- Debridement Details Patient Name: Heal, Mozes W. Date of Service: 11/07/2021 9:00 AM Medical Record Number: [id number] Patient Account Number: [id number] Date of Birth/Sex: 01-04-1956 (66 y.o. M) Treating RN: Laurina Bustle Primary Care Provider: [name] Other Clinician: Referring Provider: [name] Treating Provider/Extender: Rowan Blase in Treatment: 13 Debridement Performed for Wound #1 Left Calcaneus Assessment: Performed By: Physician Nelida Meuse., PA-C Debridement Type: Chemical/Enzymatic/Mechanical Agent Used: Santyl Severity of Tissue Pre Debridement: Fat layer exposed Level of Consciousness (Pre- Awake and Alert procedure): Pre-procedure Verification/Time Out Yes - 09:26 Taken: Instrument: Other : tongue blade Bleeding: None Response to Treatment: Procedure was tolerated well Level of Consciousness (Post- Awake and Alert procedure): Post Debridement Measurements of Total Wound Length: (cm) 3.8 Width: (cm) 3.5 Depth: (cm) 0.2 Volume: (cm) 2.089 Character of Wound/Ulcer Post Debridement: Stable Severity of Tissue Post Debridement: Fat layer exposed Post Procedure Diagnosis Same as Pre-procedure Electronic  Signature(s) Signed: 11/07/2021 4:26:20 PM By: Lenda Kelp PA-C Signed: 11/07/2021 4:29:51 PM By: Laurina Bustle Entered By: Laurina Bustle on 11/07/2021 09:27:33 Ordaz, Tahmid W. ([id number]) -------------------------------------------------------------------------------- HPI Details Patient Name: Salls, Markell W. Date of Service: 11/07/2021 9:00 AM Medical Record Number: [id number] Patient Account Number: [id number] Date of Birth/Sex: 09-14-1955 (66 y.o. M) Treating RN: Huel Coventry Primary Care Provider: [name] Other Clinician: Referring Provider: [name] Treating Provider/Extender: Rowan Blase in Treatment: 13 History of Present Illness HPI Description: 08/08/2021 upon evaluation today patient appears to be doing somewhat poorly in regard to a heel ulcer that began as a result of the patient tells me him trying to get his foot into his shoes that were somewhat tight. This was around 4 January. He was using a shoehorn fairly aggressively and thinks that he may have caused the bruising at that time. Fortunately there does not appear to be any signs of infection at this point. Nonetheless he does have some definite necrotic tissue. This can need to be cleared away. Nonetheless unfortunately [name] we do not have clearance in my opinion therefore to go forward with an aggressive sharp debridement. Patient does have diabetes mellitus type 2 with the most recent hemoglobin A1c of 5.2 and that was on June 2022. He also has a history of chronic venous insufficiency, peripheral vascular disease, and hypertension. Obviously with regard to the peripheral vascular disease we will continue to send him to vascular to quantify his ABIs and TBI's. 08/22/2021 upon evaluation today patient appears to be doing well with regard to his wound. This is definitely showing some signs of improvement which is great news and overall very pleased with where things stand today. There does not  appear to be any evidence of active infection locally nor systemically which is great news and overall I feel like that this is showing signs of improvement. It is definitely much less irritated as compared to last week. I am very pleased in that regard I am good to be  able to get a lot of this dry skin off today as well which is great news. 09/05/2021 upon evaluation today patient's heel actually showing signs of some improvement though there is still a bit of eschar noted in the center part of the wound. I do believe this is going require some sharp debridement to clear this away. The Iodosorb just is not quite cutting it. Fortunately there does not appear to be any signs of active infection at this time locally nor systemically. 09/12/2021 upon evaluation today patient appears to be doing well with regard to his wound. He has been tolerating the dressing changes I do believe that clearing off the eschar last week has been to help there is still some necrotic tissue removed today overall he seems to be doing quite well however. 09/20/2021 upon evaluation today patient appears to be doing well with regard to his wound he does have some necrotic tissue noted on the surface of the wound but I do feel like we are getting better here definitely making good progress. I do not see any signs of infection which is great news and overall I think that we are on the right track. 09-26-2021 upon evaluation today patient appears to be doing well with regard to his heel ulcer. This is actually measuring smaller and looking better today. Fortunately I do not see any evidence of active infection locally or systemically which is great news. 10-03-2021 upon evaluation today patient appears to be doing well currently in regard to his heel for the most part although he does have a little bit of what appears to be bruising in the base of the wound. I am not sure exactly where to get the pressure from but this very likely is  happening when he is walking he tells me has been wearing some other shoes been a little bit more on his feet than normal. Fortunately I do not see any evidence of active infection at this time. I have contemplated put him in a different type of shoe or even a total contact cast to be honest. With that being said his balance is not good and I therefore have some concerns in that regard. 10-17-2021 upon evaluation today patient appears to be doing poorly in regard to his heel. Again this is showing signs of significant deep tissue injury which is bringing into question whether or not he may actually have something going on here which is more related to his blood flow. We have previously done arterial study though to be honest with ABIs right at 1 and noncompressible which tends to make me think this may be falsely elevated coupled with TBI's which were at 0.5 with biphasic waveforms I am not certain he is getting the blood flow needed to completely heal this wound. I discussed with him today that he may need to see vascular in order to discuss options for her further angiogram/evaluation in general. I would at least leave it up to them as to whether they feel the angiogram is necessary or not. Nonetheless I do believe pressure is also playing a component to this he does tell me he started sleeping with his heel on the bed not raised underneath the pillow already this was dark and discolored that may have been what triggered this to get worse I am not really sure. 5/8; the wound seems to have less of a deep tissue injury but there is still eschar. Scant amount of purulent drainage some erythema medially extending up  the heel. He has neuropathy no pain no systemic illness. Culture that we did last time showed moderate Proteus, moderate Enterococcus and moderate Pseudomonas. The Enterococcus will require ampicillin so he will require 2 antibiotics. He is using Santyl to the wound 10-31-2021 upon evaluation  today patient appears to be doing well with regard to his wound all things considered. There is still necrotic tissue noted in the central portion of the wound which is not what we want to see but unfortunately I think this is just could be the case until we ensure he has good blood flow and can subsequently get things moving in a better direction. Fortunately I do not see any evidence of infection locally or systemically which is great news and overall I think that we are on the right track in that regard. I am going to go ahead and refill the Augmentin as well as the Cipro for him today he was given originally 10 days worth I would have her extend that for 7 days. 11-07-2021 upon evaluation today patient appears to be doing better in regard to the overall appearance of his wound and I do feel like the Melburn Popper is doing a good job here. Fortunately there does not appear to be any evidence of active infection locally nor systemically which is great news and overall I am extremely pleased with where things stand today. The patient is happy as well with the progress. Nonetheless I had extended his Augmentin and Cipro for him at the last visit I think that is going to need to be extended a bit further. That means he is probably can need some probiotics as well. Hickling, Bell W. ([id number]) Electronic Signature(s) Signed: 11/07/2021 11:26:00 AM By: Lenda Kelp PA-C Entered By: Lenda Kelp on 11/07/2021 11:26:00 Romberger, Valen W. ([id number]) -------------------------------------------------------------------------------- Physical Exam Details Patient Name: Wyche, Mahonri W. Date of Service: 11/07/2021 9:00 AM Medical Record Number: [id number] Patient Account Number: [id number] Date of Birth/Sex: 20-Sep-1955 (66 y.o. M) Treating RN: Huel Coventry Primary Care Provider: [name] Other Clinician: Referring Provider: [name] Treating Provider/Extender: Rowan Blase in Treatment:  13 Constitutional Well-nourished and well-hydrated in no acute distress. Respiratory normal breathing without difficulty. Psychiatric this patient is able to make decisions and demonstrates good insight into disease process. Alert and Oriented x 3. pleasant and cooperative. Notes Upon inspection patient's wound is showing signs of improvement there still quite a bit of necrotic tissue that the Santyl is working on and softening up overall though I do believe this is better there is no new evidence of pressure injury which is good news. Electronic Signature(s) Signed: 11/07/2021 11:30:37 AM By: Lenda Kelp PA-C Entered By: Lenda Kelp on 11/07/2021 11:30:37 Casas, Rolly W. ([id number]) -------------------------------------------------------------------------------- Physician Orders Details Patient Name: Calixte, Murry W. Date of Service: 11/07/2021 9:00 AM Medical Record Number: [id number] Patient Account Number: [id number] Date of Birth/Sex: 05-25-1956 (66 y.o. M) Treating RN: Laurina Bustle Primary Care Provider: [name] Other Clinician: Referring Provider: [name] Treating Provider/Extender: Rowan Blase in Treatment: 13 Verbal / Phone Orders: No Diagnosis Coding ICD-10 Coding Code Description E11.621 Type 2 diabetes mellitus with foot ulcer L97.422 Non-pressure chronic ulcer of left heel and midfoot with fat layer exposed I87.2 Venous insufficiency (chronic) (peripheral) I73.89 Other specified peripheral vascular diseases I10 Essential (primary) hypertension Follow-up Appointments o Return Appointment in 1 week. o Nurse Visit as needed [employer] o Wash wounds with antibacterial soap and water. o May shower; gently cleanse  wound with antibacterial soap, rinse and pat dry prior to dressing wounds - shower with dressing on and remove after shower o No tub bath. Edema Control - Lymphedema / Segmental Compressive Device / Other o Elevate,  Exercise Daily and Avoid Standing for Dobis Periods of Time. o Elevate leg(s) parallel to the floor when sitting. o DO YOUR BEST to sleep in the bed at night. DO NOT sleep in your recliner. Reinders hours of sitting in a recliner leads to swelling of the legs and/or potential wounds on your backside. Medications-Please add to medication list. o P.O. Antibiotics - continue Augmentin and Cipro Wound Treatment Wound #1 - Calcaneus Wound Laterality: Left Topical: Santyl Collagenase Ointment, 30 (gm), tube 1 x Per Day/30 Days Discharge Instructions: apply nickel thick to wound bed only Primary Dressing: Gauze (Generic) 1 x Per Day/30 Days Discharge Instructions: As directed moistened with saline Secondary Dressing: ABD Pad 5x9 (in/in) (Generic) 1 x Per Day/30 Days Discharge Instructions: Cover with ABD pad Secondary Dressing: Kerlix 4.5 x 4.1 (in/yd) (Generic) 1 x Per Day/30 Days Discharge Instructions: Apply Kerlix 4.5 x 4.1 (in/yd) as instructed Secured With: Medipore Tape - 54M Medipore H Soft Cloth Surgical Tape, 2x2 (in/yd) 1 x Per Day/30 Days Patient Medications Allergies: No Known Drug Allergies Notifications Medication Indication Start End Augmentin 11/07/2021 DOSE 1 - oral 875 mg-125 mg tablet - 1 tablet oral taken 2 times per day for 30 days Cipro 11/07/2021 DOSE 1 - oral 500 mg tablet - 1 tablet oral taken 2 times per day for 30 days. Rudie, Orlandus W. ([id number]) Electronic Signature(s) Signed: 11/07/2021 11:33:02 AM By: Lenda Kelp PA-C Entered By: Lenda Kelp on 11/07/2021 11:33:02 Marengo, Trip W. ([id number]) -------------------------------------------------------------------------------- Problem List Details Patient Name: Porche, Arlyn W. Date of Service: 11/07/2021 9:00 AM Medical Record Number: [id number] Patient Account Number: [id number] Date of Birth/Sex: [birth year] (66 y.o. 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 ([id number]) -------------------------------------------------------------------------------- Progress Note Details Patient Name: Blais, Jaleal W. Date of Service: 11/07/2021 9:00 AM Medical Record Number: [id number] Patient Account Number: [id number] Date of Birth/Sex: Feb 12, 1956 (66 y.o. M) Treating RN: Huel Coventry Primary Care Provider: [name] Other Clinician: Referring Provider: [name] Treating Provider/Extender: Rowan Blase in Treatment: 13 Subjective Chief Complaint Information obtained from Patient Left heel ulcer History of Present Illness (HPI) 08/08/2021 upon evaluation today patient appears to be doing somewhat poorly in regard to a heel ulcer that began as a result of the patient tells me him trying to get his foot into his shoes that were somewhat tight. This was around 4 January. He was using a shoehorn fairly aggressively and thinks that he may have caused the bruising at that time. Fortunately there does not appear to be any signs of  infection at this point. Nonetheless he does have some definite necrotic tissue. This  can need to be cleared away. Nonetheless unfortunately [name] we do not have clearance in my opinion therefore to go forward with an aggressive sharp debridement. Patient does have diabetes mellitus type 2 with the most recent hemoglobin A1c of 5.2 and that was on June 2022. He also has a history of chronic venous insufficiency, peripheral vascular disease, and hypertension. Obviously with regard to the peripheral vascular disease we will continue to send him to vascular to quantify his ABIs and TBI's. 08/22/2021 upon evaluation today patient appears to be doing well with regard to his wound. This is definitely showing some signs of improvement which is great news and overall very pleased with where things stand today. There does not appear to be any evidence of active infection locally nor systemically which is great news and overall I feel like that this is showing signs of improvement. It is definitely much less irritated as compared to last week. I am very pleased in that regard I am good to be able to get a lot of this dry skin off today as well which is great news. 09/05/2021 upon evaluation today patient's heel actually showing signs of some improvement though there is still a bit of eschar noted in the center part of the wound. I do believe this is going require some sharp debridement to clear this away. The Iodosorb just is not quite cutting it. Fortunately there does not appear to be any signs of active infection at this time locally nor systemically. 09/12/2021 upon evaluation today patient appears to be doing well with regard to his wound. He has been tolerating the dressing changes I do believe that clearing off the eschar last week has been to help there is still some necrotic tissue removed today overall he seems to be doing quite well however. 09/20/2021 upon evaluation today patient appears to be doing well with regard to his wound he does have some necrotic tissue  noted on the surface of the wound but I do feel like we are getting better here definitely making good progress. I do not see any signs of infection which is great news and overall I think that we are on the right track. 09-26-2021 upon evaluation today patient appears to be doing well with regard to his heel ulcer. This is actually measuring smaller and looking better today. Fortunately I do not see any evidence of active infection locally or systemically which is great news. 10-03-2021 upon evaluation today patient appears to be doing well currently in regard to his heel for the most part although he does have a little bit of what appears to be bruising in the base of the wound. I am not sure exactly where to get the pressure from but this very likely is happening when he is walking he tells me has been wearing some other shoes been a little bit more on his feet than normal. Fortunately I do not see any evidence of active infection at this time. I have contemplated put him in a different type of shoe or even a total contact cast to be honest. With that being said his balance is not good and I therefore have some concerns in that regard. 10-17-2021 upon evaluation today patient appears to be doing poorly in regard to his heel. Again this is showing signs of significant deep tissue injury which is bringing into question whether or not he may  actually have something going on here which is more related to his blood flow. We have previously done arterial study though to be honest with ABIs right at 1 and noncompressible which tends to make me think this may be falsely elevated coupled with TBI's which were at 0.5 with biphasic waveforms I am not certain he is getting the blood flow needed to completely heal this wound. I discussed with him today that he may need to see vascular in order to discuss options for her further angiogram/evaluation in general. I would at least leave it up to them as to whether they  feel the angiogram is necessary or not. Nonetheless I do believe pressure is also playing a component to this he does tell me he started sleeping with his heel on the bed not raised underneath the pillow already this was dark and discolored that may have been what triggered this to get worse I am not really sure. 5/8; the wound seems to have less of a deep tissue injury but there is still eschar. Scant amount of purulent drainage some erythema medially extending up the heel. He has neuropathy no pain no systemic illness. Culture that we did last time showed moderate Proteus, moderate Enterococcus and moderate Pseudomonas. The Enterococcus will require ampicillin so he will require 2 antibiotics. He is using Santyl to the wound 10-31-2021 upon evaluation today patient appears to be doing well with regard to his wound all things considered. There is still necrotic tissue noted in the central portion of the wound which is not what we want to see but unfortunately I think this is just could be the case until we ensure he has good blood flow and can subsequently get things moving in a better direction. Fortunately I do not see any evidence of infection locally or systemically which is great news and overall I think that we are on the right track in that regard. I am going to go ahead and refill the Augmentin as well as the Cipro for him today he was given originally 10 days worth I would have her extend that for 7 days. 11-07-2021 upon evaluation today patient appears to be doing better in regard to the overall appearance of his wound and I do feel like the Melburn Popper is doing a good job here. Fortunately there does not appear to be any evidence of active infection locally nor systemically which is great news and overall I am extremely pleased with where things stand today. The patient is happy as well with the progress. Nonetheless I had extended his Bawa, Andreas W. ([id number]) Augmentin and Cipro for him at  the last visit I think that is going to need to be extended a bit further. That means he is probably can need some probiotics as well. Objective Constitutional Well-nourished and well-hydrated in no acute distress. Vitals Time Taken: 9:04 AM, Height: 68 in, Weight: 220 lbs, BMI: 33.4, Temperature: 97.8 F, Pulse: 74 bpm, Respiratory Rate: 18 breaths/min, Blood Pressure: 181/89 mmHg. Respiratory normal breathing without difficulty. Psychiatric this patient is able to make decisions and demonstrates good insight into disease process. Alert and Oriented x 3. pleasant and cooperative. General Notes: Upon inspection patient's wound is showing signs of improvement there still quite a bit of necrotic tissue that the Santyl is working on and softening up overall though I do believe this is better there is no new evidence of pressure injury which is good news. Integumentary (Hair, Skin) Wound #1 status is Open. Original cause  of wound was Pressure Injury. The date acquired was: 06/19/2021. The wound has been in treatment 13 weeks. The wound is located on the Left Calcaneus. The wound measures 3.8cm length x 3.5cm width x 0.2cm depth; 10.446cm^2 area and 2.089cm^3 volume. There is a medium amount of serosanguineous drainage noted. 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 There was a Chemical/Enzymatic/Mechanical debridement performed by Nelida Meuse., PA-C. With the following instrument(s): tongue blade. Agent used was [employer]. A  time out was conducted at 09:26, prior to the start of the procedure. There was no bleeding. The procedure was tolerated well. Post Debridement Measurements: 3.8cm length x 3.5cm width x 0.2cm depth; 2.089cm^3 volume. Character of Wound/Ulcer Post Debridement is stable. Severity of Tissue Post Debridement is: Fat layer exposed. Post procedure Diagnosis Wound #1: Same as Pre-Procedure Plan Follow-up Appointments: Return Appointment in 1 week. Charney, [place] Lacretia Nicks ([id number]) Nurse Visit as needed Bathing/ Shower/ Hygiene: Wash wounds with antibacterial soap and water. May shower; gently cleanse wound with antibacterial soap, rinse and pat dry prior to dressing wounds - shower with dressing on and remove after shower No tub bath. Edema Control - Lymphedema / Segmental Compressive Device / Other: Elevate, Exercise Daily and Avoid Standing for [place] Periods of Time. Elevate leg(s) parallel to the floor when sitting. DO YOUR BEST to sleep in the bed at night. DO NOT sleep in your recliner. Espinoza hours of sitting in a recliner leads to swelling of the legs and/or potential wounds on your backside. Medications-Please add to medication list.: P.O. Antibiotics - continue Augmentin and Cipro The following medication(s) was prescribed: Augmentin oral 875 mg-125 mg tablet 1 1 tablet oral taken 2 times per day for 30 days starting 11/07/2021 Cipro oral 500 mg tablet 1 1 tablet oral taken 2 times per day for 30 days. starting 11/07/2021 WOUND #1: - Calcaneus Wound Laterality: Left Topical: Santyl Collagenase Ointment, 30 (gm), tube 1 x Per Day/30 Days Discharge Instructions: apply nickel thick to wound bed only Primary Dressing: Gauze (Generic) 1 x Per Day/30 Days Discharge Instructions: As directed moistened with saline Secondary Dressing: ABD Pad 5x9 (in/in) (Generic) 1 x Per Day/30 Days Discharge Instructions: Cover with ABD pad Secondary Dressing: Kerlix 4.5 x 4.1 (in/yd) (Generic) 1 x Per Day/30  Days Discharge Instructions: Apply Kerlix 4.5 x 4.1 (in/yd) as instructed Secured With: Medipore Tape - 55M Medipore H Soft Cloth Surgical Tape, 2x2 (in/yd) 1 x Per Day/30 Days 1. I am good recommend currently that we go ahead and continue with the wound care measures as before and the patient is in agreement with the plan. This includes the use of the Santyl moistened gauze over top of this to the heel in order to allow it to hopefully soften up a lot of the necrotic tissue at this point. 2. I am also can recommend at this point that we have the patient continue to change this daily his wife is doing an awesome job based on what I am seeing. 3. I am also can recommend that we have the patient continue with the antibiotics. This is going to include the use  of Augmentin as well as Cipro. I am going to actually add this and for an additional 30 days. I am also recommending over-the-counter probiotic for him which I have the patient pick up from the pharmacy as well. Hopefully this will help with some of the GI upset. We will see patient back for reevaluation in 2 weeks here in the clinic. If anything worsens or changes patient will contact our office for additional recommendations. Electronic Signature(s) Signed: 11/07/2021 11:46:24 AM By: Lenda Kelp PA-C Previous Signature: 11/07/2021 11:45:17 AM Version By: Lenda Kelp PA-C Entered By: Lenda Kelp on 11/07/2021 11:46:24 Stock, Emmerich W. ([id number]) -------------------------------------------------------------------------------- SuperBill Details Patient Name: Barbee, Cristian W. Date of Service: 11/07/2021 Medical Record Number: [id number] Patient Account Number: [id number] Date of Birth/Sex: [birth year] (66 y.o. 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

## 2021-11-15 ENCOUNTER — Other Ambulatory Visit: Payer: Self-pay | Admitting: Family Medicine

## 2021-11-15 DIAGNOSIS — E039 Hypothyroidism, unspecified: Secondary | ICD-10-CM

## 2021-11-21 ENCOUNTER — Encounter: Payer: No Typology Code available for payment source | Attending: Physician Assistant | Admitting: Physician Assistant

## 2021-11-21 DIAGNOSIS — E11621 Type 2 diabetes mellitus with foot ulcer: Secondary | ICD-10-CM | POA: Insufficient documentation

## 2021-11-21 DIAGNOSIS — E1151 Type 2 diabetes mellitus with diabetic peripheral angiopathy without gangrene: Secondary | ICD-10-CM | POA: Diagnosis not present

## 2021-11-21 DIAGNOSIS — L97422 Non-pressure chronic ulcer of left heel and midfoot with fat layer exposed: Secondary | ICD-10-CM | POA: Insufficient documentation

## 2021-11-21 DIAGNOSIS — I872 Venous insufficiency (chronic) (peripheral): Secondary | ICD-10-CM | POA: Diagnosis not present

## 2021-11-21 DIAGNOSIS — I10 Essential (primary) hypertension: Secondary | ICD-10-CM | POA: Diagnosis not present

## 2021-11-21 NOTE — Progress Notes (Signed)
[name] ([id number]) Visit Report for 11/21/2021 Arrival Information Details Patient Name: [name], [name]. Date of Service: 11/21/2021 9:00 AM Medical Record Number: [id number] Patient Account Number: [id number] Date of Birth/Sex: 05-25-1956 (66 y.o. M) Treating RN: Alycia Rossetti Primary Care [name]: Alysia Penna Other Clinician: Referring [name]: Alysia Penna Treating Jonel Sick/Extender: [name] in Treatment: 15 Visit Information History Since Last Visit Added or deleted any medications: No Patient Arrived: [name] Any new allergies or adverse reactions: No Arrival Time: 08:49 Had a fall or experienced change in No Transfer Assistance: None activities of daily living that may affect Patient Identification Verified: Yes risk of falls: Secondary Verification Process Completed: Yes Hospitalized since last visit: No Patient Has Alerts: Yes Has Dressing in Place as Prescribed: Yes Patient Alerts: ABI R >1.0 [place] TBI 0.50 Pain Present Now: No ABI L 0.98 Electronic Signature(s) Signed: 11/21/2021 5:04:47 PM By: Alycia Rossetti Entered By: Alycia Rossetti on 11/21/2021 09:02:06 [name]. ([id number]) -------------------------------------------------------------------------------- Clinic Level of Care Assessment Details Patient Name: [name]. Date of Service: 11/21/2021 9:00 AM Medical Record Number: [id number] Patient Account Number: [id number] Date of Birth/Sex: Jun 09, 1956 (66 y.o. M) Treating RN: Alycia Rossetti Primary Care [name]: Alysia Penna Other Clinician: Referring Bernardine Langworthy: Alysia Penna Treating [name]/Extender: [name] in Treatment: 15 Clinic Level of Care Assessment Items TOOL 1 Quantity Score []  - Use when EandM and Procedure is performed on INITIAL visit 0 ASSESSMENTS - Nursing Assessment / Reassessment []  - General Physical Exam (combine [name]/ comprehensive assessment (listed just below) when performed on new 0 pt. evals) []  - 0 Comprehensive  Assessment (HX, ROS, Risk Assessments, Wounds Hx, etc.) ASSESSMENTS - Wound and Skin Assessment / Reassessment []  - Dermatologic / Skin Assessment (not related to wound area) 0 ASSESSMENTS - Ostomy and/or Continence Assessment and Care []  - Incontinence Assessment and Management 0 []  - 0 Ostomy Care Assessment and Management (repouching, etc.) PROCESS - Coordination of Care []  - Simple Patient / Family Education for ongoing care 0 []  - 0 Complex (extensive) Patient / Family Education for ongoing care []  - 0 Staff obtains Programmer, systems, Records, Test Results / Process Orders []  - 0 Staff telephones HHA, Nursing Homes / Clarify orders / etc []  - 0 Routine Transfer to another Facility (non-emergent condition) []  - 0 Routine Hospital Admission (non-emergent condition) []  - 0 New Admissions / Biomedical engineer / Ordering NPWT, Apligraf, etc. []  - 0 Emergency Hospital Admission (emergent condition) PROCESS - Special Needs []  - Pediatric / Minor Patient Management 0 []  - 0 Isolation Patient Management []  - 0 Hearing / Language / Visual special needs []  - 0 Assessment of Community assistance (transportation, D/C planning, etc.) []  - 0 Additional assistance / Altered mentation []  - 0 Support Surface(s) Assessment (bed, cushion, seat, etc.) INTERVENTIONS - Miscellaneous []  - External ear exam 0 []  - 0 Patient Transfer (multiple staff / Civil Service fast streamer / Similar devices) []  - 0 Simple Staple / Suture removal (25 or less) []  - 0 Complex Staple / Suture removal (26 or more) []  - 0 Hypo/Hyperglycemic Management (do not check if billed separately) []  - 0 Ankle / Brachial Index (ABI) - do not check if billed separately Has the patient been seen at the hospital within the last three years: Yes Total Score: 0 Level Of Care: ____ [name] ([id number]) Electronic Signature(s) Signed: 11/21/2021 5:04:47 PM By: Alycia Rossetti Entered By: Alycia Rossetti on 11/21/2021 09:28:33 [name], Gerald  [name]. ([id number]) -------------------------------------------------------------------------------- Encounter Discharge Information Details Patient Name:  [name]. Date of Service: 11/21/2021 9:00 AM Medical Record Number: [id number] Patient Account Number: [id number] Date of Birth/Sex: 1955/06/26 (66 y.o. M) Treating RN: Alycia Rossetti Primary Care [name]: Alysia Penna Other Clinician: Referring [name]: Alysia Penna Treating Lashone Stauber/Extender: [name] in Treatment: 15 Encounter Discharge Information Items Post Procedure Vitals Discharge Condition: Stable Temperature (F): 97.8 Ambulatory Status: Cane Pulse (bpm): 74 Discharge Destination: Home Respiratory Rate (breaths/min): 16 Transportation: Private Auto Blood Pressure (mmHg): 177/76 Accompanied By: self Schedule Follow-up Appointment: Yes Clinical Summary of Care: Electronic Signature(s) Signed: 11/21/2021 5:04:47 PM By: Alycia Rossetti Entered By: Alycia Rossetti on 11/21/2021 09:41:35 [name], [name]. ([id number]) -------------------------------------------------------------------------------- Lower Extremity Assessment Details Patient Name: [name], [name]. Date of Service: 11/21/2021 9:00 AM Medical Record Number: [id number] Patient Account Number: [id number] Date of Birth/Sex: 03-05-1956 (66 y.o. M) Treating RN: Alycia Rossetti Primary Care [name]: Alysia Penna Other Clinician: Referring [name]: Alysia Penna Treating [name]/Extender: [name] Weeks in Treatment: 15 Electronic Signature(s) Signed: 11/21/2021 5:04:47 PM By: Alycia Rossetti Entered By: Alycia Rossetti on 11/21/2021 09:22:28 [name], [name]. ([id number]) -------------------------------------------------------------------------------- Multi Wound Chart Details Patient Name: [name], [name]. Date of Service: 11/21/2021 9:00 AM Medical Record Number: [id number] Patient Account Number: [id number] Date of Birth/Sex: May 16, 1956 (66 y.o. M) Treating RN: Alycia Rossetti Primary  Care Easten Maceachern: Alysia Penna Other Clinician: Referring Marquisa Salih: Alysia Penna Treating Angelice Piech/Extender: [name] in Treatment: 15 Vital Signs Height(in): 68 Pulse(bpm): 36 Weight(lbs): 220 Blood Pressure(mmHg): 177/76 Body Mass Index(BMI): 33.4 Temperature(F): 97.8 Respiratory Rate(breaths/min): 16 Photos: [N/A:N/A] Wound Location: Left Calcaneus N/A N/A Wounding Event: Pressure Injury N/A N/A Primary Etiology: Diabetic Wound/Ulcer of the Lower N/A N/A Extremity Comorbid History: Cataracts, Hypertension, Type II N/A N/A Diabetes, Neuropathy Date Acquired: 06/19/2021 N/A N/A Weeks of Treatment: 15 N/A N/A Wound Status: Open N/A N/A Wound Recurrence: No N/A N/A Measurements L x [name] x D (cm) 3.2x3.2x0.2 N/A N/A Area (cm) : 8.042 N/A N/A Volume (cm) : 1.608 N/A N/A % Reduction in Area: -254.30% N/A N/A % Reduction in Volume: -608.40% N/A N/A Classification: Grade 2 N/A N/A Exudate Amount: Medium N/A N/A Exudate Type: Serosanguineous N/A N/A Exudate Color: red, brown N/A N/A Treatment Notes Electronic Signature(s) Signed: 11/21/2021 5:04:47 PM By: Alycia Rossetti Entered By: Alycia Rossetti on 11/21/2021 09:22:47 [name], [name]. ([id number]) -------------------------------------------------------------------------------- [place] Details Patient Name: [name], [name]. Date of Service: 11/21/2021 9:00 AM Medical Record Number: [id number] Patient Account Number: [id number] Date of Birth/Sex: [birth year] (66 y.o. M) Treating RN: Alycia Rossetti Primary Care Enes Rokosz: Alysia Penna Other Clinician: Referring Saryn Cherry: Alysia Penna Treating Sameerah Nachtigal/Extender: [name] in Treatment: 15 Active Inactive Wound/Skin Impairment Nursing Diagnoses: Impaired tissue integrity Knowledge deficit related to ulceration/compromised skin integrity Goals: Ulcer/skin breakdown will have a volume reduction of 30% by week 4 Date Initiated: 08/08/2021 Target Resolution Date:  09/05/2021 Goal Status: Active Ulcer/skin breakdown will have a volume reduction of 50% by week 8 Date Initiated: 08/08/2021 Target Resolution Date: 10/03/2021 Goal Status: Active Ulcer/skin breakdown will have a volume reduction of 80% by week 12 Date Initiated: 08/08/2021 Target Resolution Date: 10/31/2021 Goal Status: Active Ulcer/skin breakdown will heal within 14 weeks Date Initiated: 08/08/2021 Target Resolution Date: 11/14/2021 Goal Status: Active Interventions: Assess patient/caregiver ability to obtain necessary supplies Assess patient/caregiver ability to perform ulcer/skin care regimen upon admission and as needed Assess ulceration(s) every visit Provide education on ulcer and skin care Notes: Electronic Signature(s) Signed: 11/21/2021 5:04:47 PM By: Alycia Rossetti Entered By: Alycia Rossetti on 11/21/2021 09:22:37 [name], Gerald  [name]. ([id number]) -------------------------------------------------------------------------------- Pain Assessment Details Patient Name: [name], [name]. Date of Service: 11/21/2021 9:00 AM Medical Record Number: [id number] Patient Account Number: [id number] Date of Birth/Sex: 04-24-1956 (66 y.o. M) Treating RN: Alycia Rossetti Primary Care Jermia Rigsby: Alysia Penna Other Clinician: Referring Rayette Mogg: Alysia Penna Treating Delorse Shane/Extender: [name] in Treatment: 15 Active Problems Location of Pain Severity and Description of Pain Patient Has Paino No Site Locations Rate the pain. Current Pain Level: 0 Pain Management and Medication Current Pain Management: Electronic Signature(s) Signed: 11/21/2021 5:04:47 PM By: Alycia Rossetti Entered By: Alycia Rossetti on 11/21/2021 09:02:53 [name], [name]. ([id number]) -------------------------------------------------------------------------------- Patient/Caregiver Education Details Patient Name: [name], [name]. Date of Service: 11/21/2021 9:00 AM Medical Record Number: [id number] Patient Account Number: [id number] Date of  Birth/Gender: 1955-08-28 (66 y.o. M) Treating RN: Alycia Rossetti Primary Care Physician: Alysia Penna Other Clinician: Referring Physician: Alysia Penna Treating Physician/Extender: [name] in Treatment: 15 Education Assessment Education Provided To: Patient Education Topics Provided Wound/Skin Impairment: Handouts: Caring for Your Ulcer Methods: Demonstration Responses: State content correctly Electronic Signature(s) Signed: 11/21/2021 5:04:47 PM By: Alycia Rossetti Entered By: Alycia Rossetti on 11/21/2021 09:29:10 Ewings, Romelo [name]. ([id number]) -------------------------------------------------------------------------------- Wound Assessment Details Patient Name: Wheller, Kasper [name]. Date of Service: 11/21/2021 9:00 AM Medical Record Number: [id number] Patient Account Number: [id number] Date of Birth/Sex: 1955-07-07 (66 y.o. M) Treating RN: Alycia Rossetti Primary Care Lyrique Hakim: Alysia Penna Other Clinician: Referring Elonna Mcfarlane: Alysia Penna Treating Rosamary Boudreau/Extender: [name] in Treatment: 15 Wound Status Wound Number: 1 Primary Etiology: Diabetic Wound/Ulcer of the Lower Extremity Wound Location: Left Calcaneus Wound Status: Open Wounding Event: Pressure Injury Comorbid Cataracts, Hypertension, Type II Diabetes, History: Neuropathy Date Acquired: 06/19/2021 Weeks Of Treatment: 15 Clustered Wound: No Photos Wound Measurements Length: (cm) 3.2 Width: (cm) 3.2 Depth: (cm) 0.2 Area: (cm) 8.042 Volume: (cm) 1.608 % Reduction in Area: -254.3% % Reduction in Volume: -608.4% Wound Description Classification: Grade 2 Exudate Amount: Medium Exudate Type: Serosanguineous Exudate Color: red, brown Treatment Notes Wound #1 (Calcaneus) Wound Laterality: Left Cleanser Peri-Wound Care Topical Santyl Collagenase Ointment, 30 (gm), tube Discharge Instruction: apply nickel thick to wound bed only Primary Dressing Gauze Discharge Instruction: As directed moistened with  saline Secondary Dressing ABD Pad 5x9 (in/in) Discharge Instruction: Cover with ABD pad Wherry, Tamon [name]. ([id number]) Kerlix 4.5 x 4.1 (in/yd) Discharge Instruction: Apply Kerlix 4.5 x 4.1 (in/yd) as instructed Secured With Medipore Tape - 15M Medipore H Soft Cloth Surgical Tape, 2x2 (in/yd) Compression Wrap Compression Stockings Add-Ons Electronic Signature(s) Signed: 11/21/2021 5:04:47 PM By: Alycia Rossetti Entered By: Alycia Rossetti on 11/21/2021 09:11:33 Kroeze, Anwar [name]. ([id number]) -------------------------------------------------------------------------------- Vitals Details Patient Name: Wool, Iam [name]. Date of Service: 11/21/2021 9:00 AM Medical Record Number: [id number] Patient Account Number: [id number] Date of Birth/Sex: [birth year] (66 y.o. M) Treating RN: Alycia Rossetti Primary Care Emilyn Ruble: Alysia Penna Other Clinician: Referring Likisha Alles: Alysia Penna Treating Cloyd Ragas/Extender: [name] in Treatment: 15 Vital Signs Time Taken: 09:02 Temperature (F): 97.8 Height (in): 68 Pulse (bpm): 74 Weight (lbs): 220 Respiratory Rate (breaths/min): 16 Body Mass Index (BMI): 33.4 Blood Pressure (mmHg): 177/76 Reference Range: 80 - 120 mg / dl Electronic Signature(s) Signed: 11/21/2021 5:04:47 PM By: Alycia Rossetti Entered By: Alycia Rossetti on 11/21/2021 09:02:46

## 2021-11-21 NOTE — Progress Notes (Addendum)
Schaber, [name] ([id number]) Visit Report for 11/21/2021 Chief Complaint Document Details Patient Name: [name], [name] Date of Service: 11/21/2021 9:00 AM Medical Record Number: [id number] Patient Account Number: [id number] Date of Birth/Sex: 28-Sep-1955 (66 y.o. M) Treating RN: Primary Care Provider: [name] Other Clinician: Referring Provider: [name] Treating Provider/Extender: Rowan Blase in Treatment: 15 Information Obtained from: Patient Chief Complaint Left heel ulcer Electronic Signature(s) Signed: 11/21/2021 9:05:22 AM By: Lenda Kelp PA-C Entered By: Lenda Kelp on 11/21/2021 09:05:22 Klos, Hudsen W. ([id number]) -------------------------------------------------------------------------------- Debridement Details Patient Name: [name], [name] Date of Service: 11/21/2021 9:00 AM Medical Record Number: [id number] Patient Account Number: [id number] Date of Birth/Sex: 04/30/1956 (66 y.o. M) Treating RN: Laurina Bustle Primary Care Provider: [name] Other Clinician: Referring Provider: [name] Treating Provider/Extender: Rowan Blase in Treatment: 15 Debridement Performed for Wound #1 Left Calcaneus Assessment: Performed By: Physician Nelida Meuse., PA-C Debridement Type: Debridement Severity of Tissue Pre Debridement: Fat layer exposed Level of Consciousness (Pre- Awake and Alert procedure): Pre-procedure Verification/Time Out Yes - 09:23 Taken: Start Time: 09:23 Total Area Debrided (L x W): 3.2 (cm) x 3.2 (cm) = 10.24 (cm) Tissue and other material Viable, Non-Viable, Eschar, Slough, Subcutaneous, Biofilm, Slough debrided: Level: Skin/Subcutaneous Tissue Debridement Description: Excisional Instrument: Curette Bleeding: Minimum Hemostasis Achieved: Pressure End Time: 09:27 Procedural Pain: 0 Post Procedural Pain: 0 Response to Treatment: Procedure was tolerated well Level of Consciousness (Post- Awake and Alert procedure): Post  Debridement Measurements of Total Wound Length: (cm) 3.2 Width: (cm) 3.2 Depth: (cm) 0.3 Volume: (cm) 2.413 Character of Wound/Ulcer Post Debridement: Improved Severity of Tissue Post Debridement: Fat layer exposed Post Procedure Diagnosis Same as Pre-procedure Electronic Signature(s) Signed: 11/21/2021 4:19:54 PM By: Lenda Kelp PA-C Signed: 11/21/2021 5:04:47 PM By: Laurina Bustle Entered By: Laurina Bustle on 11/21/2021 09:28:05 Janowicz, Zaeem W. ([id number]) -------------------------------------------------------------------------------- HPI Details Patient Name: [name], [name] Date of Service: 11/21/2021 9:00 AM Medical Record Number: [id number] Patient Account Number: [id number] Date of Birth/Sex: 07-03-1955 (66 y.o. M) Treating RN: Primary Care Provider: [name] Other Clinician: Referring Provider: [name] Treating Provider/Extender: Rowan Blase in Treatment: 15 History of Present Illness HPI Description: 08/08/2021 upon evaluation today patient appears to be doing somewhat poorly in regard to a heel ulcer that began as a result of the patient tells me him trying to get his foot into his shoes that were somewhat tight. This was around 4 January. He was using a shoehorn fairly aggressively and thinks that he may have caused the bruising at that time. Fortunately there does not appear to be any signs of infection at this point. Nonetheless he does have some definite necrotic tissue. This can need to be cleared away. Nonetheless unfortunately [name] we do not have clearance in my opinion therefore to go forward with an aggressive sharp debridement. Patient does have diabetes mellitus type 2 with the most recent hemoglobin A1c of 5.2 and that was on June 2022. He also has a history of chronic venous insufficiency, peripheral vascular disease, and hypertension. Obviously with regard to the peripheral vascular disease we will continue to send him to vascular  to quantify his ABIs and TBI's. 08/22/2021 upon evaluation today patient appears to be doing well with regard to his wound. This is definitely showing some signs of improvement which is great news and overall very pleased with where things stand today. There does not appear to be any evidence of active infection locally nor systemically which  is great news and overall I feel like that this is showing signs of improvement. It is definitely much less irritated as compared to last week. I am very pleased in that regard I am good to be able to get a lot of this dry skin off today as well which is great news. 09/05/2021 upon evaluation today patient's heel actually showing signs of some improvement though there is still a bit of eschar noted in the center part of the wound. I do believe this is going require some sharp debridement to clear this away. The Iodosorb just is not quite cutting it. Fortunately there does not appear to be any signs of active infection at this time locally nor systemically. 09/12/2021 upon evaluation today patient appears to be doing well with regard to his wound. He has been tolerating the dressing changes I do believe that clearing off the eschar last week has been to help there is still some necrotic tissue removed today overall he seems to be doing quite well however. 09/20/2021 upon evaluation today patient appears to be doing well with regard to his wound he does have some necrotic tissue noted on the surface of the wound but I do feel like we are getting better here definitely making good progress. I do not see any signs of infection which is great news and overall I think that we are on the right track. 09-26-2021 upon evaluation today patient appears to be doing well with regard to his heel ulcer. This is actually measuring smaller and looking better today. Fortunately I do not see any evidence of active infection locally or systemically which is great news. 10-03-2021 upon  evaluation today patient appears to be doing well currently in regard to his heel for the most part although he does have a little bit of what appears to be bruising in the base of the wound. I am not sure exactly where to get the pressure from but this very likely is happening when he is walking he tells me has been wearing some other shoes been a little bit more on his feet than normal. Fortunately I do not see any evidence of active infection at this time. I have contemplated put him in a different type of shoe or even a total contact cast to be honest. With that being said his balance is not good and I therefore have some concerns in that regard. 10-17-2021 upon evaluation today patient appears to be doing poorly in regard to his heel. Again this is showing signs of significant deep tissue injury which is bringing into question whether or not he may actually have something going on here which is more related to his blood flow. We have previously done arterial study though to be honest with ABIs right at 1 and noncompressible which tends to make me think this may be falsely elevated coupled with TBI's which were at 0.5 with biphasic waveforms I am not certain he is getting the blood flow needed to completely heal this wound. I discussed with him today that he may need to see vascular in order to discuss options for her further angiogram/evaluation in general. I would at least leave it up to them as to whether they feel the angiogram is necessary or not. Nonetheless I do believe pressure is also playing a component to this he does tell me he started sleeping with his heel on the bed not raised underneath the pillow already this was dark and discolored that may have been  what triggered this to get worse I am not really sure. 5/8; the wound seems to have less of a deep tissue injury but there is still eschar. Scant amount of purulent drainage some erythema medially extending up the heel. He has  neuropathy no pain no systemic illness. Culture that we did last time showed moderate Proteus, moderate Enterococcus and moderate Pseudomonas. The Enterococcus will require ampicillin so he will require 2 antibiotics. He is using Santyl to the wound 10-31-2021 upon evaluation today patient appears to be doing well with regard to his wound all things considered. There is still necrotic tissue noted in the central portion of the wound which is not what we want to see but unfortunately I think this is just could be the case until we ensure he has good blood flow and can subsequently get things moving in a better direction. Fortunately I do not see any evidence of infection locally or systemically which is great news and overall I think that we are on the right track in that regard. I am going to go ahead and refill the Augmentin as well as the Cipro for him today he was given originally 10 days worth I would have her extend that for 7 days. 11-07-2021 upon evaluation today patient appears to be doing better in regard to the overall appearance of his wound and I do feel like the Melburn Popper is doing a good job here. Fortunately there does not appear to be any evidence of active infection locally nor systemically which is great news and overall I am extremely pleased with where things stand today. The patient is happy as well with the progress. Nonetheless I had extended his Augmentin and Cipro for him at the last visit I think that is going to need to be extended a bit further. That means he is probably can need some probiotics as well. 11-21-2021 upon evaluation today patient appears to be doing a little better in regard to size and is definitely making some progress here. With that being said he also definitely has some areas that are still fairly thick with necrotic tissue. We discussed debridement yet again today and he is Blacklock, Nichole W. ([id number]) amenable to doing some debridement as [place] as its not too  aggressive which I think we can definitely do. I do believe the Melburn Popper is doing a great job and will definitely be continuing that as well. Electronic Signature(s) Signed: 11/21/2021 10:13:34 AM By: Lenda Kelp PA-C Entered By: Lenda Kelp on 11/21/2021 10:13:34 Laprise, Adger W. ([id number]) -------------------------------------------------------------------------------- Physical Exam Details Patient Name: Kath, [name] Date of Service: 11/21/2021 9:00 AM Medical Record Number: [id number] Patient Account Number: [id number] Date of Birth/Sex: 06-03-1956 (66 y.o. M) Treating RN: Primary Care Provider: [name] Other Clinician: Referring Provider: [name] Treating Provider/Extender: Rowan Blase in Treatment: 15 Constitutional Well-nourished and well-hydrated in no acute distress. Respiratory normal breathing without difficulty. Psychiatric this patient is able to make decisions and demonstrates good insight into disease process. Alert and Oriented x 3. pleasant and cooperative. Notes Upon inspection patient's wound bed actually showed signs of good granulation and epithelization at this point. Fortunately I do not see any evidence of infection light right now that is worsening which is great and overall I think you are on the right track. Electronic Signature(s) Signed: 11/21/2021 10:13:49 AM By: Lenda Kelp PA-C Entered By: Lenda Kelp on 11/21/2021 10:13:49 Crew, Brittany W. ([id number]) -------------------------------------------------------------------------------- Physician Orders Details Patient Name: Armentor, Chaim  W. Date of Service: 11/21/2021 9:00 AM Medical Record Number: [id number] Patient Account Number: [id number] Date of Birth/Sex: 08/23/1955 (66 y.o. 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 o Nurse Visit as needed [facility] o Wash wounds with antibacterial soap and water. o May shower; gently cleanse wound with antibacterial soap, rinse and pat dry prior to dressing wounds - shower with dressing on and remove after shower o No tub bath. Edema Control - Lymphedema / Segmental Compressive Device / Other o Elevate, Exercise Daily and Avoid Standing for [place] Periods of Time. o Elevate leg(s) parallel to the floor when sitting. o DO YOUR BEST to sleep in the bed at night. DO NOT sleep in your recliner. Tosi hours of sitting in a recliner leads to swelling of the legs and/or potential wounds on your backside. Medications-Please add to medication list. o P.O. Antibiotics - continue Augmentin and Cipro Wound Treatment Wound #1 - Calcaneus Wound Laterality: Left Topical: Santyl Collagenase Ointment, 30 (gm), tube 1 x Per Day/30 Days Discharge Instructions: apply nickel thick to wound bed only Primary Dressing: Gauze (Generic) 1 x Per Day/30 Days Discharge Instructions: As directed moistened with saline Secondary Dressing: ABD Pad 5x9 (in/in) (Generic) 1 x Per Day/30 Days Discharge Instructions: Cover with ABD pad Secondary Dressing: Kerlix 4.5 x 4.1 (in/yd) (Generic) 1 x Per Day/30 Days Discharge Instructions: Apply Kerlix 4.5 x 4.1 (in/yd) as instructed Secured With: Medipore Tape - 68M Medipore H Soft Cloth Surgical Tape, 2x2 (in/yd) 1 x Per Day/30 Days Electronic Signature(s) Signed: 11/21/2021 4:19:54 PM By: Lenda Kelp PA-C Signed: 11/21/2021 5:04:47 PM By: Laurina Bustle Entered By: Laurina Bustle on 11/21/2021 09:28:25 Wecker, Rojelio W. ([id number]) -------------------------------------------------------------------------------- Problem List Details Patient Name: Malkowski, William W. Date of Service: 11/21/2021 9:00 AM Medical Record Number: [id number] Patient Account Number: [id number] Date of Birth/Sex: [birth year] (66 y.o. M) Treating RN: Primary Care Provider: [name] Other Clinician: Referring Provider: [name] Treating Provider/Extender: Rowan Blase in Treatment: 15 Active Problems ICD-10 Encounter Code Description Active Date MDM Diagnosis E11.621 Type 2 diabetes mellitus with foot ulcer 08/08/2021 No Yes L97.422 Non-pressure chronic ulcer of left heel and midfoot with fat layer 08/08/2021 No Yes exposed Y86.578 Other chronic osteomyelitis, left ankle and foot 11/07/2021 No Yes I87.2 Venous insufficiency (chronic) (peripheral) 08/08/2021 No Yes I73.89 Other specified peripheral vascular diseases 08/08/2021 No Yes I10 Essential (primary) hypertension 08/08/2021 No Yes Inactive Problems Resolved Problems Electronic Signature(s) Signed: 11/21/2021 4:19:54 PM By: Lenda Kelp PA-C Signed: 11/21/2021 5:04:47 PM By: Laurina Bustle Previous Signature: 11/21/2021 9:05:19 AM Version By: Lenda Kelp PA-C Entered By: Laurina Bustle on 11/21/2021 09:29:16 Haley, Harjit W. ([id number]) -------------------------------------------------------------------------------- Progress Note Details Patient Name: Ellington, [name] Date of Service: 11/21/2021 9:00 AM Medical Record Number: [id number] Patient Account Number: [id number] Date of Birth/Sex: 1956-04-13 (66 y.o. M) Treating RN: Primary Care Provider: [name] Other Clinician: Referring Provider: [name] Treating Provider/Extender: Rowan Blase in Treatment: 15 Subjective Chief Complaint Information obtained from Patient Left heel  ulcer  History of Present Illness (HPI) 08/08/2021 upon evaluation today patient appears to be doing somewhat poorly in regard to a heel ulcer that began as a result of the patient tells me him trying to get his foot into his shoes that were somewhat tight. This was around 4 January. He was using a shoehorn fairly aggressively and thinks that he may have caused the bruising at that time. Fortunately there does not appear to be any signs of infection at this point. Nonetheless he does have some definite necrotic tissue. This can need to be cleared away. Nonetheless unfortunately [name] we do not have clearance in my opinion therefore to go forward with an aggressive sharp debridement. Patient does have diabetes mellitus type 2 with the most recent hemoglobin A1c of 5.2 and that was on June 2022. He also has a history of chronic venous insufficiency, peripheral vascular disease, and hypertension. Obviously with regard to the peripheral vascular disease we will continue to send him to vascular to quantify his ABIs and TBI's. 08/22/2021 upon evaluation today patient appears to be doing well with regard to his wound. This is definitely showing some signs of improvement which is great news and overall very pleased with where things stand today. There does not appear to be any evidence of active infection locally nor systemically which is great news and overall I feel like that this is showing signs of improvement. It is definitely much less irritated as compared to last week. I am very pleased in that regard I am good to be able to get a lot of this dry skin off today as well which is great news. 09/05/2021 upon evaluation today patient's heel actually showing signs of some improvement though there is still a bit of eschar noted in the center part of the wound. I do believe this is going require some sharp debridement to clear this away. The Iodosorb just is not quite cutting  it. Fortunately there does not appear to be any signs of active infection at this time locally nor systemically. 09/12/2021 upon evaluation today patient appears to be doing well with regard to his wound. He has been tolerating the dressing changes I do believe that clearing off the eschar last week has been to help there is still some necrotic tissue removed today overall he seems to be doing quite well however. 09/20/2021 upon evaluation today patient appears to be doing well with regard to his wound he does have some necrotic tissue noted on the surface of the wound but I do feel like we are getting better here definitely making good progress. I do not see any signs of infection which is great news and overall I think that we are on the right track. 09-26-2021 upon evaluation today patient appears to be doing well with regard to his heel ulcer. This is actually measuring smaller and looking better today. Fortunately I do not see any evidence of active infection locally or systemically which is great news. 10-03-2021 upon evaluation today patient appears to be doing well currently in regard to his heel for the most part although he does have a little bit of what appears to be bruising in the base of the wound. I am not sure exactly where to get the pressure from but this very likely is happening when he is walking he tells me has been wearing some other shoes been a little bit more on his feet than normal. Fortunately I do not see any evidence of  active infection at this time. I have contemplated put him in a different type of shoe or even a total contact cast to be honest. With that being said his balance is not good and I therefore have some concerns in that regard. 10-17-2021 upon evaluation today patient appears to be doing poorly in regard to his heel. Again this is showing signs of significant deep tissue injury which is bringing into question whether or not he may actually have something going on  here which is more related to his blood flow. We have previously done arterial study though to be honest with ABIs right at 1 and noncompressible which tends to make me think this may be falsely elevated coupled with TBI's which were at 0.5 with biphasic waveforms I am not certain he is getting the blood flow needed to completely heal this wound. I discussed with him today that he may need to see vascular in order to discuss options for her further angiogram/evaluation in general. I would at least leave it up to them as to whether they feel the angiogram is necessary or not. Nonetheless I do believe pressure is also playing a component to this he does tell me he started sleeping with his heel on the bed not raised underneath the pillow already this was dark and discolored that may have been what triggered this to get worse I am not really sure. 5/8; the wound seems to have less of a deep tissue injury but there is still eschar. Scant amount of purulent drainage some erythema medially extending up the heel. He has neuropathy no pain no systemic illness. Culture that we did last time showed moderate Proteus, moderate Enterococcus and moderate Pseudomonas. The Enterococcus will require ampicillin so he will require 2 antibiotics. He is using Santyl to the wound 10-31-2021 upon evaluation today patient appears to be doing well with regard to his wound all things considered. There is still necrotic tissue noted in the central portion of the wound which is not what we want to see but unfortunately I think this is just could be the case until we ensure he has good blood flow and can subsequently get things moving in a better direction. Fortunately I do not see any evidence of infection locally or systemically which is great news and overall I think that we are on the right track in that regard. I am going to go ahead and refill the Augmentin as well as the Cipro for him today he was given originally 10 days  worth I would have her extend that for 7 days. 11-07-2021 upon evaluation today patient appears to be doing better in regard to the overall appearance of his wound and I do feel like the Melburn Popper is doing a good job here. Fortunately there does not appear to be any evidence of active infection locally nor systemically which is great news and overall I am extremely pleased with where things stand today. The patient is happy as well with the progress. Nonetheless I had extended his Fryberger, Darien W. ([id number]) Augmentin and Cipro for him at the last visit I think that is going to need to be extended a bit further. That means he is probably can need some probiotics as well. 11-21-2021 upon evaluation today patient appears to be doing a little better in regard to size and is definitely making some progress here. With that being said he also definitely has some areas that are still fairly thick with necrotic tissue. We discussed debridement  yet again today and he is amenable to doing some debridement as [place] as its not too aggressive which I think we can definitely do. I do believe the Melburn Popper is doing a great job and will definitely be continuing that as well. Objective Constitutional Well-nourished and well-hydrated in no acute distress. Vitals Time Taken: 9:02 AM, Height: 68 in, Weight: 220 lbs, BMI: 33.4, Temperature: 97.8 F, Pulse: 74 bpm, Respiratory Rate: 16 breaths/min, Blood Pressure: 177/76 mmHg. Respiratory normal breathing without difficulty. Psychiatric this patient is able to make decisions and demonstrates good insight into disease process. Alert and Oriented x 3. pleasant and cooperative. General Notes: Upon inspection patient's wound bed actually showed signs of good granulation and epithelization at this point. Fortunately I do not see any evidence of infection light right now that is worsening which is great and overall I think you are on the right track. Integumentary (Hair,  Skin) Wound #1 status is Open. Original cause of wound was Pressure Injury. The date acquired was: 06/19/2021. The wound has been in treatment 15 weeks. The wound is located on the Left Calcaneus. The wound measures 3.2cm length x 3.2cm width x 0.2cm depth; 8.042cm^2 area and 1.608cm^3 volume. There is a medium amount of serosanguineous drainage noted. 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 There was a Excisional Skin/Subcutaneous Tissue Debridement with a total area of 10.24 sq cm performed by Nelida Meuse., PA-C. With the following instrument(s): Curette to remove Viable and Non-Viable tissue/material. Material removed includes Eschar, Subcutaneous Tissue, Slough, and Biofilm. No specimens were taken. A time out was conducted at 09:23, prior to the start of the procedure. A Minimum amount of bleeding was controlled with Pressure. The procedure was tolerated well with a pain level of 0 throughout and a pain level of 0 following the procedure. Post Debridement Measurements: 3.2cm length x 3.2cm width x 0.3cm depth; 2.413cm^3 volume. Character of Wound/Ulcer Post Debridement is improved. Severity of Tissue Post Debridement is: Fat layer exposed. Post procedure Diagnosis Wound #1: Same as Pre-Procedure Hyslop, Legrande W. ([id number]) Plan Follow-up Appointments: Return Appointment in 1 week. Nurse Visit as needed Bathing/ Shower/ Hygiene: Wash wounds with antibacterial soap and water. May shower; gently cleanse wound with  antibacterial soap, rinse and pat dry prior to dressing wounds - shower with dressing on and remove after shower No tub bath. Edema Control - Lymphedema / Segmental Compressive Device / Other: Elevate, Exercise Daily and Avoid Standing for Vetsch Periods of Time. Elevate leg(s) parallel to the floor when sitting. DO YOUR BEST to sleep in the bed at night. DO NOT sleep in your recliner. Duris hours of sitting in a recliner leads to swelling of the legs and/or potential wounds on your backside. Medications-Please add to medication list.: P.O. Antibiotics - continue Augmentin and Cipro WOUND #1: - Calcaneus Wound Laterality: Left Topical: Santyl Collagenase Ointment, 30 (gm), tube 1 x Per Day/30 Days Discharge Instructions: apply nickel thick to wound bed only Primary Dressing: Gauze (Generic) 1 x Per Day/30 Days Discharge Instructions: As directed moistened with saline Secondary Dressing: ABD Pad 5x9 (in/in) (Generic) 1 x Per Day/30 Days  Discharge Instructions: Cover with ABD pad Secondary Dressing: Kerlix 4.5 x 4.1 (in/yd) (Generic) 1 x Per Day/30 Days Discharge Instructions: Apply Kerlix 4.5 x 4.1 (in/yd) as instructed Secured With: Medipore Tape - 61M Medipore H Soft Cloth Surgical Tape, 2x2 (in/yd) 1 x Per Day/30 Days 1. I am going to suggest that we go ahead and continue with the wound care measures as before and the patient is in agreement with plan. This includes the use of the Santyl ointment. I was able to remove a lot of necrotic debris today with sharp debridement and postdebridement this appears to be doing much better which is great news. He still has quite a bit of necrotic tissue to clear way to the base of the wound but I think we are on a better track. 2. Also can recommend that we have the patient continue with an ABD pad to cover. 3. I am also going to suggest that the patient continue with the roll gauze to secure in place which I think he is doing well with. We will see  patient back for reevaluation in 1 week here in the clinic. If anything worsens or changes patient will contact our office for additional recommendations. Electronic Signature(s) Signed: 11/21/2021 10:14:42 AM By: Lenda Kelp PA-C Entered By: Lenda Kelp on 11/21/2021 10:14:42 Selkirk, Cesareo W. ([id number]) -------------------------------------------------------------------------------- SuperBill Details Patient Name: Paglia, [name] Date of Service: 11/21/2021 Medical Record Number: [id number] Patient Account Number: [id number] Date of Birth/Sex: 03-13-1956 (66 y.o. 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, left ankle and foot I87.2 Venous insufficiency (chronic) (peripheral) I73.89 Other specified peripheral vascular diseases I10 Essential (primary) hypertension Facility Procedures CPT4 Code: 78295621 Description: 11042 - DEB SUBQ TISSUE 20 SQ CM/< Modifier: Quantity: 1 CPT4 Code: Description: ICD-10 Diagnosis Description L97.422 Non-pressure chronic ulcer of left heel and midfoot with fat layer exp Modifier: osed Quantity: Physician Procedures CPT4 Code: 3086578 Description: 11042 - WC PHYS SUBQ TISS 20 SQ CM Modifier: Quantity: 1 CPT4 Code: Description: ICD-10 Diagnosis Description L97.422 Non-pressure chronic ulcer of left heel and midfoot with fat layer exp Modifier: osed Quantity: Electronic Signature(s) Signed: 11/21/2021 10:15:31 AM By: Lenda Kelp PA-C Entered By: Lenda Kelp on 11/21/2021 10:15:31

## 2021-12-01 ENCOUNTER — Other Ambulatory Visit (INDEPENDENT_AMBULATORY_CARE_PROVIDER_SITE_OTHER): Payer: Self-pay | Admitting: Vascular Surgery

## 2021-12-01 DIAGNOSIS — I7025 Atherosclerosis of native arteries of other extremities with ulceration: Secondary | ICD-10-CM

## 2021-12-05 ENCOUNTER — Encounter: Payer: No Typology Code available for payment source | Admitting: Physician Assistant

## 2021-12-05 DIAGNOSIS — E11621 Type 2 diabetes mellitus with foot ulcer: Secondary | ICD-10-CM | POA: Diagnosis not present

## 2021-12-05 NOTE — Progress Notes (Signed)
Segers, Izacc Lacretia Nicks ([id number]) Visit Report for 12/05/2021 Chief Complaint Document Details Patient Name: [name], [name] Date of Service: 12/05/2021 8:15 AM Medical Record Number: [id number] Patient Account Number: [id number] Date of Birth/Sex: December 13, 1955 (66 y.o. M) Treating RN: Huel Coventry Primary Care Provider: [name] Other Clinician: Betha Loa Referring Provider: [name] Treating Provider/Extender: Rowan Blase in Treatment: 17 Information Obtained from: Patient Chief Complaint Left heel ulcer Electronic Signature(s) Signed: 12/05/2021 8:32:38 AM By: Lenda Kelp PA-C Entered By: Lenda Kelp on 12/05/2021 08:32:38 Ryer, Trayvond W. ([id number]) -------------------------------------------------------------------------------- Problem List Details Patient Name: [name], [name] Date of Service: 12/05/2021 8:15 AM Medical Record Number: [id number] Patient Account Number: [id number] Date of Birth/Sex: Jun 14, 1956 (66 y.o. M) Treating RN: Huel Coventry Primary Care Provider: [name] Other Clinician: Betha Loa Referring Provider: [name] Treating Provider/Extender: Allen Derry Weeks in Treatment: 17 Active Problems ICD-10 Encounter Code Description Active Date MDM Diagnosis E11.621 Type 2 diabetes mellitus with foot ulcer 08/08/2021 No Yes L97.422 Non-pressure chronic ulcer of left heel and midfoot with fat layer 08/08/2021 No Yes exposed R56.153 Other chronic osteomyelitis, left ankle and foot 11/07/2021 No Yes I87.2 Venous insufficiency (chronic) (peripheral) 08/08/2021 No Yes I73.89 Other specified peripheral vascular diseases 08/08/2021 No Yes I10 Essential (primary) hypertension 08/08/2021 No Yes Inactive Problems Resolved Problems Electronic Signature(s) Signed: 12/05/2021 8:32:34 AM By: Lenda Kelp PA-C Entered By: Lenda Kelp on 12/05/2021 08:32:34

## 2021-12-06 NOTE — Progress Notes (Signed)
[name], [name] ([id number]) Visit Report for 12/05/2021 Arrival Information Details Patient Name: Corpus, [name] Date of Service: 12/05/2021 8:15 AM Medical Record Number: [id number] Patient Account Number: [id number] Date of Birth/Sex: December 04, 1955 (66 y.o. M) Treating RN: Huel Coventry Primary Care Jetaun Colbath: [name] Other Clinician: Betha Loa Referring Rakesh Dutko: [name] Treating Marthella Osorno/Extender: Rowan Blase in Treatment: 17 Visit Information History Since Last Visit All ordered tests and consults were completed: No Patient Arrived: [name] Added or deleted any medications: No Arrival Time: 08:23 Any new allergies or adverse reactions: No Transfer Assistance: None Had a fall or experienced change in No Patient Has Alerts: Yes activities of daily living that may affect Patient Alerts: ABI R >1.0 Bennet TBI 0.50 risk of falls: ABI L 0.98 Hospitalized since last visit: No Pain Present Now: No Electronic Signature(s) Signed: 12/05/2021 3:03:11 PM By: Betha Loa Entered By: Betha Loa on 12/05/2021 08:24:48 [name], [name] ([id number]) -------------------------------------------------------------------------------- Clinic Level of Care Assessment Details Patient Name: [name], [name] Date of Service: 12/05/2021 8:15 AM Medical Record Number: [id number] Patient Account Number: [id number] Date of Birth/Sex: 1955/08/30 (66 y.o. M) Treating RN: Huel Coventry Primary Care Lithzy Bernard: [name] Other Clinician: Betha Loa Referring Sherlon Nied: [name] Treating Shanie Mauzy/Extender: Rowan Blase in Treatment: 17 Clinic Level of Care Assessment Items TOOL 1 Quantity Score []  - Use when EandM and Procedure is performed on INITIAL visit 0 ASSESSMENTS - Nursing Assessment / Reassessment []  - General Physical Exam (combine w/ comprehensive assessment (listed just below) when performed on new 0 pt. evals) []  - 0 Comprehensive Assessment (HX, ROS, Risk Assessments,  Wounds Hx, etc.) ASSESSMENTS - Wound and Skin Assessment / Reassessment []  - Dermatologic / Skin Assessment (not related to wound area) 0 ASSESSMENTS - Ostomy and/or Continence Assessment and Care []  - Incontinence Assessment and Management 0 []  - 0 Ostomy Care Assessment and Management (repouching, etc.) PROCESS - Coordination of Care []  - Simple Patient / Family Education for ongoing care 0 []  - 0 Complex (extensive) Patient / Family Education for ongoing care []  - 0 Staff obtains , Records, Test Results / Process Orders []  - 0 Staff telephones HHA, Nursing Homes / Clarify orders / etc []  - 0 Routine Transfer to another Facility (non-emergent condition) []  - 0 Routine Hospital Admission (non-emergent condition) []  - 0 New Admissions / / Ordering NPWT, Apligraf, etc. []  - 0 Emergency Hospital Admission (emergent condition) PROCESS - Special Needs []  - Pediatric / Minor Patient Management 0 []  - 0 Isolation Patient Management []  - 0 Hearing / Language / Visual special needs []  - 0 Assessment of Community assistance (transportation, D/C planning, etc.) []  - 0 Additional assistance / Altered mentation []  - 0 Support Surface(s) Assessment (bed, cushion, seat, etc.) INTERVENTIONS - Miscellaneous []  - External ear exam 0 []  - 0 Patient Transfer (multiple staff / / Similar devices) []  - 0 Simple Staple / Suture removal (25 or less) []  - 0 Complex Staple / Suture removal (26 or more) []  - 0 Hypo/Hyperglycemic Management (do not check if billed separately) []  - 0 Ankle / Brachial Index (ABI) - do not check if billed separately Has the patient been seen at the hospital within the last three years: Yes Total Score: 0 Level Of Care: ____ ( ) Electronic Signature(s) Signed: 12/05/2021 3:03:11 PM By: Entered By: on 12/05/2021 08:55:06 [name], [name]  (Chiropractor) -------------------------------------------------------------------------------- Encounter Discharge Information Details Patient Name: [name], [name] Date  of Service: 12/05/2021 8:15 AM Medical Record Number: [id number] Patient Account Number: [id number] Date of Birth/Sex: [birth year] (66 y.o. M) Treating RN: Huel Coventry Primary Care Kaylean Tupou: [name] Other Clinician: Betha Loa Referring Cherina Dhillon: [name] Treating Kaleya Douse/Extender: Rowan Blase in Treatment: 17 Encounter Discharge Information Items Post Procedure Vitals Discharge Condition: Stable Temperature (F): 97.9 Ambulatory Status: Cane Pulse (bpm): 80 Discharge Destination: Home Respiratory Rate (breaths/min): 18 Transportation: Private Auto Blood Pressure (mmHg): 173/81 Accompanied By: self Schedule Follow-up Appointment: Yes Clinical Summary of Care: Electronic Signature(s) Signed: 12/05/2021 3:03:11 PM By: Betha Loa Entered By: Betha Loa on 12/05/2021 09:05:50 [name], [name] ([id number]) -------------------------------------------------------------------------------- Lower Extremity Assessment Details Patient Name: [name], [name] Date of Service: 12/05/2021 8:15 AM Medical Record Number: [id number] Patient Account Number: [id number] Date of Birth/Sex: May 19, 1956 (66 y.o. M) Treating RN: Huel Coventry Primary Care Corde Antonini: [name] Other Clinician: Betha Loa Referring Geralynn Capri: [name] Treating Aricka Goldberger/Extender: Allen Derry Weeks in Treatment: 17 Edema Assessment Assessed: Kyra Searles: Yes] Franne Forts: No] Edema: [Left: Ye] [Right: s] Calf Left: Right: Point of Measurement: 30 cm From Medial Instep 35.7 cm Ankle Left: Right: Point of Measurement: 10 cm From Medial Instep 24 cm Vascular Assessment Pulses: Dorsalis Pedis Palpable: [Left:Yes] Electronic Signature(s) Signed: 12/05/2021 3:03:11 PM By: Betha Loa Signed: 12/06/2021 12:21:47 PM By: Elliot Gurney, BSN, RN, CWS, Kim  RN, BSN Entered By: Betha Loa on 12/05/2021 08:40:13 [name], [name] ([id number]) -------------------------------------------------------------------------------- Multi Wound Chart Details Patient Name: Sowell, Kayron W. Date of Service: 12/05/2021 8:15 AM Medical Record Number: [id number] Patient Account Number: [id number] Date of Birth/Sex: 1955-12-24 (66 y.o. M) Treating RN: Huel Coventry Primary Care Fiore Detjen: [name] Other Clinician: Betha Loa Referring Kerina Simoneau: [name] Treating Kashlynn Kundert/Extender: Rowan Blase in Treatment: 17 Vital Signs Height(in): 68 Pulse(bpm): 80 Weight(lbs): 220 Blood Pressure(mmHg): 173/81 Body Mass Index(BMI): 33.4 Temperature(F): 97.9 Respiratory Rate(breaths/min): 18 Photos: [N/A:N/A] Wound Location: Left Calcaneus N/A N/A Wounding Event: Pressure Injury N/A N/A Primary Etiology: Diabetic Wound/Ulcer of the Lower N/A N/A Extremity Comorbid History: Cataracts, Hypertension, Type II N/A N/A Diabetes, Neuropathy Date Acquired: 06/19/2021 N/A N/A Weeks of Treatment: 17 N/A N/A Wound Status: Open N/A N/A Wound Recurrence: No N/A N/A Measurements L x W x D (cm) 2.3x2.8x0.2 N/A N/A Area (cm) : 5.058 N/A N/A Volume (cm) : 1.012 N/A N/A % Reduction in Area: -122.80% N/A N/A % Reduction in Volume: -345.80% N/A N/A Classification: Grade 2 N/A N/A Exudate Amount: Medium N/A N/A Exudate Type: Serosanguineous N/A N/A Exudate Color: red, brown N/A N/A Granulation Amount: Small (1-33%) N/A N/A Granulation Quality: Red, Hyper-granulation N/A N/A Exposed Structures: Fat Layer (Subcutaneous Tissue): N/A N/A Yes Epithelialization: Small (1-33%) N/A N/A Treatment Notes Electronic Signature(s) Signed: 12/05/2021 3:03:11 PM By: Betha Loa Entered By: Betha Loa on 12/05/2021 08:48:09 Dasch, Corian W. ([id number]) -------------------------------------------------------------------------------- Multi-Disciplinary Care Plan  Details Patient Name: Doerr, Quientin W. Date of Service: 12/05/2021 8:15 AM Medical Record Number: [id number] Patient Account Number: [id number] Date of Birth/Sex: Jan 01, 1956 (66 y.o. M) Treating RN: Huel Coventry Primary Care Vanshika Jastrzebski: [name] Other Clinician: Betha Loa Referring Janijah Symons: [name] Treating Tashaun Obey/Extender: Rowan Blase in Treatment: 17 Active Inactive Wound/Skin Impairment Nursing Diagnoses: Impaired tissue integrity Knowledge deficit related to ulceration/compromised skin integrity Goals: Ulcer/skin breakdown will have a volume reduction of 30% by week 4 Date Initiated: 08/08/2021 Target Resolution Date: 09/05/2021 Goal Status: Active Ulcer/skin breakdown will have a volume reduction of 50% by week 8 Date Initiated: 08/08/2021 Target Resolution Date: 10/03/2021 Goal Status: Active Ulcer/skin breakdown will  have a volume reduction of 80% by week 12 Date Initiated: 08/08/2021 Target Resolution Date: 10/31/2021 Goal Status: Active Ulcer/skin breakdown will heal within 14 weeks Date Initiated: 08/08/2021 Target Resolution Date: 11/14/2021 Goal Status: Active Interventions: Assess patient/caregiver ability to obtain necessary supplies Assess patient/caregiver ability to perform ulcer/skin care regimen upon admission and as needed Assess ulceration(s) every visit Provide education on ulcer and skin care Notes: Electronic Signature(s) Signed: 12/05/2021 3:03:11 PM By: Massie Kluver Signed: 12/06/2021 12:21:47 PM By: Gretta Cool, BSN, RN, CWS, Kim RN, BSN Entered By: Massie Kluver on 12/05/2021 08:40:20 Koslowski, Sandford W. ([id number]) -------------------------------------------------------------------------------- Pain Assessment Details Patient Name: Meaders, Eunice W. Date of Service: 12/05/2021 8:15 AM Medical Record Number: [id number] Patient Account Number: [id number] Date of Birth/Sex: [birth year] (66 y.o. M) Treating RN: Cornell Barman Primary Care Abygail Galeno: Alysia Penna Other Clinician: Massie Kluver Referring Javayah Magaw: Alysia Penna Treating [employer]/Extender: Skipper Cliche in Treatment: 17 Active Problems Location of Pain Severity and Description of Pain Patient Has Paino No Site Locations Pain Management and Medication Current Pain Management: Electronic Signature(s) Signed: 12/05/2021 3:03:11 PM By: Massie Kluver Signed: 12/06/2021 12:21:47 PM By: Gretta Cool, BSN, RN, CWS, Kim RN, BSN Entered By: Massie Kluver on 12/05/2021 08:27:37 Milles, Manly W. ([id number]) -------------------------------------------------------------------------------- Patient/Caregiver Education Details Patient Name: Antonucci, Gillis W. Date of Service: 12/05/2021 8:15 AM Medical Record Number: [id number] Patient Account Number: [id number] Date of Birth/Gender: 1955-10-27 (66 y.o. M) Treating RN: Cornell Barman Primary Care Physician: Alysia Penna Other Clinician: Massie Kluver Referring Physician: Alysia Penna Treating Physician/Extender: Skipper Cliche in Treatment: 17 Education Assessment Education Provided To: Patient Education Topics Provided Wound/Skin Impairment: Handouts: Other: continue with wound care as directed Electronic Signature(s) Signed: 12/05/2021 3:03:11 PM By: Massie Kluver Entered By: Massie Kluver on 12/05/2021 08:56:26 Wellborn, Pearl W. ([id number]) -------------------------------------------------------------------------------- Wound Assessment Details Patient Name: Galen, Lani W. Date of Service: 12/05/2021 8:15 AM Medical Record Number: [id number] Patient Account Number: [id number] Date of Birth/Sex: Aug 11, 1955 (66 y.o. M) Treating RN: Cornell Barman Primary Care Mael Delap: Alysia Penna Other Clinician: Massie Kluver Referring Rayvion Stumph: Alysia Penna Treating Henretta Quist/Extender: Jeri Cos Weeks in Treatment: 17 Wound Status Wound Number: 1 Primary Etiology: Diabetic Wound/Ulcer of the Lower Extremity Wound Location: Left Calcaneus Wound  Status: Open Wounding Event: Pressure Injury Comorbid Cataracts, Hypertension, Type II Diabetes, History: Neuropathy Date Acquired: 06/19/2021 Weeks Of Treatment: 17 Clustered Wound: No Photos Wound Measurements Length: (cm) 2.3 Width: (cm) 2.8 Depth: (cm) 0.2 Area: (cm) 5.058 Volume: (cm) 1.012 % Reduction in Area: -122.8% % Reduction in Volume: -345.8% Epithelialization: Small (1-33%) Tunneling: No Undermining: No Wound Description Classification: Grade 2 Exudate Amount: Medium Exudate Type: Serosanguineous Exudate Color: red, brown Foul Odor After Cleansing: No Slough/Fibrino Yes Wound Bed Granulation Amount: Small (1-33%) Exposed Structure Granulation Quality: Red, Hyper-granulation Fat Layer (Subcutaneous Tissue) Exposed: Yes Treatment Notes Wound #1 (Calcaneus) Wound Laterality: Left Cleanser Normal Saline Discharge Instruction: Wash your hands with soap and water. Remove old dressing, discard into plastic bag and place into trash. Cleanse the wound with Normal Saline prior to applying a clean dressing using gauze sponges, not tissues or cotton balls. Do not scrub or use excessive force. Pat dry using gauze sponges, not tissue or cotton balls. Peri-Wound Care Topical Primary Dressing Santyl Collagenase Ointment, 30 (gm), tube Larke, Jarmon W. ([id number]) Secondary Dressing ABD Pad 5x9 (in/in) Discharge Instruction: Cover with ABD pad Kerlix 4.5 x 4.1 (in/yd) Discharge Instruction: Apply Kerlix 4.5 x 4.1 (in/yd) as instructed Secured With Gaylord H Soft Cloth Surgical  Tape, 2x2 (in/yd) Compression Wrap Compression Stockings Add-Ons Electronic Signature(s) Signed: 12/05/2021 3:03:11 PM By: Massie Kluver Signed: 12/06/2021 12:21:47 PM By: Gretta Cool, BSN, RN, CWS, Kim RN, BSN Entered By: Massie Kluver on 12/05/2021 08:38:00 Halvorsen, Malakie W. ([id number]) -------------------------------------------------------------------------------- Vitals  Details Patient Name: Fernando, Alexzander W. Date of Service: 12/05/2021 8:15 AM Medical Record Number: [id number] Patient Account Number: [id number] Date of Birth/Sex: Feb 02, 1956 (66 y.o. M) Treating RN: Cornell Barman Primary Care Derhonda Eastlick: Alysia Penna Other Clinician: Massie Kluver Referring Allien Melberg: Alysia Penna Treating Elijan Googe/Extender: Skipper Cliche in Treatment: 17 Vital Signs Time Taken: 08:25 Temperature (F): 97.9 Height (in): 68 Pulse (bpm): 80 Weight (lbs): 220 Respiratory Rate (breaths/min): 18 Body Mass Index (BMI): 33.4 Blood Pressure (mmHg): 173/81 Reference Range: 80 - 120 mg / dl Electronic Signature(s) Signed: 12/05/2021 3:03:11 PM By: Massie Kluver Entered By: Massie Kluver on 12/05/2021 08:28:15

## 2021-12-12 ENCOUNTER — Encounter (INDEPENDENT_AMBULATORY_CARE_PROVIDER_SITE_OTHER): Payer: No Typology Code available for payment source

## 2021-12-12 ENCOUNTER — Ambulatory Visit (INDEPENDENT_AMBULATORY_CARE_PROVIDER_SITE_OTHER): Payer: No Typology Code available for payment source | Admitting: Vascular Surgery

## 2021-12-19 ENCOUNTER — Encounter: Payer: No Typology Code available for payment source | Attending: Physician Assistant | Admitting: Physician Assistant

## 2021-12-19 DIAGNOSIS — E11621 Type 2 diabetes mellitus with foot ulcer: Secondary | ICD-10-CM | POA: Insufficient documentation

## 2021-12-19 DIAGNOSIS — I872 Venous insufficiency (chronic) (peripheral): Secondary | ICD-10-CM | POA: Diagnosis not present

## 2021-12-19 DIAGNOSIS — E1151 Type 2 diabetes mellitus with diabetic peripheral angiopathy without gangrene: Secondary | ICD-10-CM | POA: Diagnosis not present

## 2021-12-19 DIAGNOSIS — I10 Essential (primary) hypertension: Secondary | ICD-10-CM | POA: Diagnosis not present

## 2021-12-19 DIAGNOSIS — L97422 Non-pressure chronic ulcer of left heel and midfoot with fat layer exposed: Secondary | ICD-10-CM | POA: Insufficient documentation

## 2021-12-19 DIAGNOSIS — M86672 Other chronic osteomyelitis, left ankle and foot: Secondary | ICD-10-CM | POA: Insufficient documentation

## 2021-12-19 NOTE — Progress Notes (Addendum)
[name], [name] ([id number]) Visit Report for 12/19/2021 Chief Complaint Document Details Patient Name: [name], [name] Date of Service: 12/19/2021 9:00 AM Medical Record Number: [id number] Patient Account Number: [id number] Date of Birth/Sex: 1955/11/20 (66 y.o. M) Treating RN: Yevonne Pax Primary Care Provider: [name] Other Clinician: Referring Provider: [name] Treating Provider/Extender: Rowan Blase in Treatment: 19 Information Obtained from: Patient Chief Complaint Left heel ulcer Electronic Signature(s) Signed: 12/19/2021 9:05:21 AM By: Lenda Kelp [name] Entered By: Lenda Kelp on 12/19/2021 09:05:21 [name], [name] ([id number]) -------------------------------------------------------------------------------- Debridement Details Patient Name: [name], [name] Date of Service: 12/19/2021 9:00 AM Medical Record Number: [id number] Patient Account Number: [id number] Date of Birth/Sex: 08-26-1955 (66 y.o. M) Treating RN: Yevonne Pax Primary Care Provider: [name] Other Clinician: Referring Provider: [name] Treating Provider/Extender: Rowan Blase in Treatment: 19 Debridement Performed for Wound #1 Left Calcaneus Assessment: Performed By: Physician Nelida Meuse., [name] Debridement Type: Chemical/Enzymatic/Mechanical Agent Used: Santyl Severity of Tissue Pre Debridement: Fat layer exposed Level of Consciousness (Pre- Awake and Alert procedure): Pre-procedure Verification/Time Out Yes - 09:25 Taken: Start Time: 09:25 Bleeding: None End Time: 09:26 Procedural Pain: 0 Post Procedural Pain: 0 Response to Treatment: Procedure was tolerated well Level of Consciousness (Post- Awake and Alert procedure): Post Debridement Measurements of Total Wound Length: (cm) 2.5 Width: (cm) 3 Depth: (cm) 0.2 Volume: (cm) 1.178 Character of Wound/Ulcer Post Debridement: Improved Severity of Tissue Post Debridement: Fat layer exposed Post Procedure  Diagnosis Same as Pre-procedure Electronic Signature(s) Signed: 12/19/2021 3:51:56 PM By: Lenda Kelp [name] Signed: 12/19/2021 4:23:03 PM By: Yevonne Pax RN Entered By: Yevonne Pax on 12/19/2021 09:26:06 [name], [name] ([id number]) -------------------------------------------------------------------------------- HPI Details Patient Name: [name], [name] Date of Service: 12/19/2021 9:00 AM Medical Record Number: [id number] Patient Account Number: [id number] Date of Birth/Sex: 28-Jan-1956 (66 y.o. M) Treating RN: Yevonne Pax Primary Care Provider: [name] Other Clinician: Referring Provider: [name] Treating Provider/Extender: Rowan Blase in Treatment: 19 History of Present Illness HPI Description: 08/08/2021 upon evaluation today patient appears to be doing somewhat poorly in regard to a heel ulcer that began as a result of the patient tells me him trying to get his foot into his shoes that were somewhat tight. This was around 4 January. He was using a shoehorn fairly aggressively and thinks that he may have caused the bruising at that time. Fortunately there does not appear to be any signs of infection at this point. Nonetheless he does have some definite necrotic tissue. This can need to be cleared away. Nonetheless unfortunately [name] we do not have clearance in my opinion therefore to go forward with an aggressive sharp debridement. Patient does have diabetes mellitus type 2 with the most recent hemoglobin A1c of 5.2 and that was on June 2022. He also has a history of chronic venous insufficiency, peripheral vascular disease, and hypertension. Obviously with regard to the peripheral vascular disease we will continue to send him to vascular to quantify his ABIs and TBI's. 08/22/2021 upon evaluation today patient appears to be doing well with regard to his wound. This is definitely showing some signs of improvement which is great news and overall very  pleased with where things stand today. There does not appear to be any evidence of active infection locally nor systemically which is great news and overall I feel like that this is showing signs of improvement. It is definitely much less irritated as compared to last week. I am very  pleased in that regard I am good to be able to get a lot of this dry skin off today as well which is great news. 09/05/2021 upon evaluation today patient's heel actually showing signs of some improvement though there is still a bit of eschar noted in the center part of the wound. I do believe this is going require some sharp debridement to clear this away. The Iodosorb just is not quite cutting it. Fortunately there does not appear to be any signs of active infection at this time locally nor systemically. 09/12/2021 upon evaluation today patient appears to be doing well with regard to his wound. He has been tolerating the dressing changes I do believe that clearing off the eschar last week has been to help there is still some necrotic tissue removed today overall he seems to be doing quite well however. 09/20/2021 upon evaluation today patient appears to be doing well with regard to his wound he does have some necrotic tissue noted on the surface of the wound but I do feel like we are getting better here definitely making good progress. I do not see any signs of infection which is great news and overall I think that we are on the right track. 09-26-2021 upon evaluation today patient appears to be doing well with regard to his heel ulcer. This is actually measuring smaller and looking better today. Fortunately I do not see any evidence of active infection locally or systemically which is great news. 10-03-2021 upon evaluation today patient appears to be doing well currently in regard to his heel for the most part although he does have a little bit of what appears to be bruising in the base of the wound. I am not sure exactly  where to get the pressure from but this very likely is happening when he is walking he tells me has been wearing some other shoes been a little bit more on his feet than normal. Fortunately I do not see any evidence of active infection at this time. I have contemplated put him in a different type of shoe or even a total contact cast to be honest. With that being said his balance is not good and I therefore have some concerns in that regard. 10-17-2021 upon evaluation today patient appears to be doing poorly in regard to his heel. Again this is showing signs of significant deep tissue injury which is bringing into question whether or not he may actually have something going on here which is more related to his blood flow. We have previously done arterial study though to be honest with ABIs right at 1 and noncompressible which tends to make me think this may be falsely elevated coupled with TBI's which were at 0.5 with biphasic waveforms I am not certain he is getting the blood flow needed to completely heal this wound. I discussed with him today that he may need to see vascular in order to discuss options for her further angiogram/evaluation in general. I would at least leave it up to them as to whether they feel the angiogram is necessary or not. Nonetheless I do believe pressure is also playing a component to this he does tell me he started sleeping with his heel on the bed not raised underneath the pillow already this was dark and discolored that may have been what triggered this to get worse I am not really sure. 5/8; the wound seems to have less of a deep tissue injury but there is still eschar. Scant  amount of purulent drainage some erythema medially extending up the heel. He has neuropathy no pain no systemic illness. Culture that we did last time showed moderate Proteus, moderate Enterococcus and moderate Pseudomonas. The Enterococcus will require ampicillin so he will require 2 antibiotics. He  is using Santyl to the wound 10-31-2021 upon evaluation today patient appears to be doing well with regard to his wound all things considered. There is still necrotic tissue noted in the central portion of the wound which is not what we want to see but unfortunately I think this is just could be the case until we ensure he has good blood flow and can subsequently get things moving in a better direction. Fortunately I do not see any evidence of infection locally or systemically which is great news and overall I think that we are on the right track in that regard. I am going to go ahead and refill the Augmentin as well as the Cipro for him today he was given originally 10 days worth I would have her extend that for 7 days. 11-07-2021 upon evaluation today patient appears to be doing better in regard to the overall appearance of his wound and I do feel like the Melburn Popper is doing a good job here. Fortunately there does not appear to be any evidence of active infection locally nor systemically which is great news and overall I am extremely pleased with where things stand today. The patient is happy as well with the progress. Nonetheless I had extended his Augmentin and Cipro for him at the last visit I think that is going to need to be extended a bit further. That means he is probably can need some probiotics as well. 11-21-2021 upon evaluation today patient appears to be doing a little better in regard to size and is definitely making some progress here. With that being said he also definitely has some areas that are still fairly thick with necrotic tissue. We discussed debridement yet again today and he is Caggiano, Olusegun W. ([id number]) amenable to doing some debridement as [place] as its not too aggressive which I think we can definitely do. I do believe the Melburn Popper is doing a great job and will definitely be continuing that as well. 12-05-2021 upon evaluation today patient appears to be doing well with regard to his  heel. He has been tolerating the dressing changes without complication. Fortunately there does not appear to be any evidence of active infection locally or systemically at this time. No fevers, chills, nausea, vomiting, or diarrhea. 12-19-2021 upon evaluation today patient appears to be doing excellent in regard to his wound. Has been tolerating the Santyl in an excellent fashion. Fortunately I do not see any evidence of infection locally or systemically which is great news and overall I am very pleased with where we stand today. Electronic Signature(s) Signed: 12/19/2021 9:30:01 AM By: Lenda Kelp [name] Entered By: Lenda Kelp on 12/19/2021 09:30:01 [name], [name] ([id number]) -------------------------------------------------------------------------------- Physical Exam Details Patient Name: Dame, Bowie W. Date of Service: 12/19/2021 9:00 AM Medical Record Number: [id number] Patient Account Number: [id number] Date of Birth/Sex: 1956-04-08 (66 y.o. M) Treating RN: Yevonne Pax Primary Care Provider: [name] Other Clinician: Referring Provider: [name] Treating Provider/Extender: Rowan Blase in Treatment: 18 Constitutional Well-nourished and well-hydrated in no acute distress. Respiratory normal breathing without difficulty. Psychiatric this patient is able to make decisions and demonstrates good insight into disease process. Alert and Oriented x 3. pleasant and cooperative. Notes Upon inspection  patient's wound bed actually did show signs of excellent granulation and epithelization at this point. Fortunately I do not see any signs of anything worsening overall I think he is on the right track here. No fevers, chills, nausea, vomiting, or diarrhea. Electronic Signature(s) Signed: 12/19/2021 9:30:19 AM By: Lenda KelpStone [name], [name] Entered By: Lenda KelpStone [name], Jakorey Mcconathy on 12/19/2021 09:30:19 [name], [name]  ([id number]) -------------------------------------------------------------------------------- Physician Orders Details Patient Name: [name], [name] Date of Service: 12/19/2021 9:00 AM Medical Record Number: [id number] Patient Account Number: [id number] Date of Birth/Sex: 01/08/1956 (66 y.o. 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 o Nurse Visit as needed [facility]/ Shower/ Hygiene o Wash wounds with antibacterial soap and water. o May shower; gently cleanse wound with antibacterial soap, rinse and pat dry prior to dressing wounds - shower with dressing on and remove after shower o No tub bath. Edema Control - Lymphedema / Segmental Compressive Device / Other o Elevate, Exercise Daily and Avoid Standing for [place] Periods of Time. o Elevate leg(s) parallel to the floor when sitting. o DO YOUR BEST to sleep in the bed at night. DO NOT sleep in your recliner. V[place] hours of sitting in a recliner leads to swelling of the legs and/or potential wounds on your backside. Wound Treatment Wound #1 - Calcaneus Wound Laterality: Left Cleanser: Normal Saline Discharge Instructions: Wash your hands with soap and water. Remove old dressing, discard into plastic bag and place into trash. Cleanse the wound with Normal Saline prior to applying a clean dressing using gauze sponges, not tissues or cotton  balls. Do not scrub or use excessive force. Pat dry using gauze sponges, not tissue or cotton balls. Primary Dressing: Santyl Collagenase Ointment, 30 (gm), tube Secondary Dressing: ABD Pad 5x9 (in/in) Discharge Instructions: Cover with ABD pad Secondary Dressing: Kerlix 4.5 x 4.1 (in/yd) Discharge Instructions: Apply Kerlix 4.5 x 4.1 (in/yd) as instructed Secured With: Medipore Tape - 70M Medipore H Soft Cloth Surgical Tape, 2x2 (in/yd) Electronic Signature(s) Signed: 12/19/2021 3:51:56 PM By: Lenda KelpStone [name], Imberly Troxler [name] Signed: 12/19/2021 4:23:03 PM By: Yevonne PaxEpps, Carrie RN Entered By: Yevonne PaxEpps, Carrie on 12/19/2021 09:23:24 [name], [name] ([id number]) -------------------------------------------------------------------------------- Problem List Details Patient Name: [name], [name] Date of Service: 12/19/2021 9:00 AM Medical Record Number: [id number] Patient Account Number: [id number] Date of Birth/Sex: 02/09/1956 (66 y.o. M) Treating RN: Yevonne PaxEpps, Carrie Primary Care Provider: Gershon CraneFry, Stephen Other Clinician: Referring Provider: Gershon CraneFry, Stephen Treating Provider/Extender: Rowan BlaseStone, Alferd Obryant Weeks in Treatment: 19 Active Problems ICD-10 Encounter Code Description Active Date MDM Diagnosis E11.621 Type 2 diabetes mellitus with foot ulcer 08/08/2021 No Yes L97.422 Non-pressure chronic ulcer of left heel and midfoot with fat layer 08/08/2021 No Yes exposed K44.01086.672 Other chronic osteomyelitis, left ankle and foot 11/07/2021 No Yes I87.2 Venous insufficiency (chronic) (peripheral) 08/08/2021 No Yes I73.89 Other specified peripheral vascular diseases 08/08/2021 No Yes I10 Essential (primary) hypertension 08/08/2021 No Yes Inactive Problems Resolved Problems Electronic Signature(s) Signed: 12/19/2021 9:05:18 AM By: Lenda KelpStone [name], Lucinda Spells [name] Entered By: Lenda KelpStone [name], Shanaya Schneck on 12/19/2021 09:05:18 [name], [name] ([id number]) -------------------------------------------------------------------------------- Progress  Note Details Patient Name:  [name], [name] Date of Service: 12/19/2021 9:00 AM Medical Record Number: [id number] Patient Account Number: [id number] Date of Birth/Sex: 01-07-1956 (66 y.o. M) Treating RN: Yevonne Pax Primary Care Provider: [name] Other Clinician: Referring Provider: [name] Treating Provider/Extender: Rowan Blase in Treatment: 19 Subjective Chief Complaint Information obtained from Patient Left heel ulcer History of Present Illness (HPI) 08/08/2021 upon evaluation today patient appears to be doing somewhat poorly in regard to a heel ulcer that began as a result of the patient tells me him trying to get his foot into his shoes that were somewhat tight. This was around 4 January. He was using a shoehorn fairly aggressively and thinks that he may have caused the bruising at that time. Fortunately there does not appear to be any signs of infection at this point. Nonetheless he does have some definite necrotic tissue. This can need to be cleared away. Nonetheless unfortunately [name] we do not have clearance in my opinion therefore to go forward with an aggressive sharp debridement. Patient does have diabetes mellitus type 2 with the most recent hemoglobin A1c of 5.2 and that was on June 2022. He also has a history of chronic venous insufficiency, peripheral vascular disease, and hypertension. Obviously with regard to the peripheral vascular disease we will continue to send him to vascular to quantify his ABIs and TBI's. 08/22/2021 upon evaluation today patient appears to be doing well with regard to his wound. This is definitely showing some signs of improvement which is great news and overall very pleased with where things stand today. There does not appear to be any evidence of active infection locally nor systemically which is great news and overall I feel like that this is showing signs of improvement. It is definitely much less irritated as compared to last week. I am  very pleased in that regard I am good to be able to get a lot of this dry skin off today as well which is great news. 09/05/2021 upon evaluation today patient's heel actually showing signs of some improvement though there is still a bit of eschar noted in the center part of the wound. I do believe this is going require some sharp debridement to clear this away. The Iodosorb just is not quite cutting it. Fortunately there does not appear to be any signs of active infection at this time locally nor systemically. 09/12/2021 upon evaluation today patient appears to be doing well with regard to his wound. He has been tolerating the dressing changes I do believe that clearing off the eschar last week has been to help there is still some necrotic tissue removed today overall he seems to be doing quite well however. 09/20/2021 upon evaluation today patient appears to be doing well with regard to his wound he does have some necrotic tissue noted on the surface of the wound but I do feel like we are getting better here definitely making good progress. I do not see any signs of infection which is great news and overall I think that we are on the right track. 09-26-2021 upon evaluation today patient appears to be doing well with regard to his heel ulcer. This is actually measuring smaller and looking better today. Fortunately I do not see any evidence of active infection locally or systemically which is great news. 10-03-2021 upon evaluation today patient appears to be doing well currently in regard to his heel for the most part although he does have a  little bit of what appears to be bruising in the base of the wound. I am not sure exactly where to get the pressure from but this very likely is happening when he is walking he tells me has been wearing some other shoes been a little bit more on his feet than normal. Fortunately I do not see any evidence of active infection at this time. I have contemplated put him in a  different type of shoe or even a total contact cast to be honest. With that being said his balance is not good and I therefore have some concerns in that regard. 10-17-2021 upon evaluation today patient appears to be doing poorly in regard to his heel. Again this is showing signs of significant deep tissue injury which is bringing into question whether or not he may actually have something going on here which is more related to his blood flow. We have previously done arterial study though to be honest with ABIs right at 1 and noncompressible which tends to make me think this may be falsely elevated coupled with TBI's which were at 0.5 with biphasic waveforms I am not certain he is getting the blood flow needed to completely heal this wound. I discussed with him today that he may need to see vascular in order to discuss options for her further angiogram/evaluation in general. I would at least leave it up to them as to whether they feel the angiogram is necessary or not. Nonetheless I do believe pressure is also playing a component to this he does tell me he started sleeping with his heel on the bed not raised underneath the pillow already this was dark and discolored that may have been what triggered this to get worse I am not really sure. 5/8; the wound seems to have less of a deep tissue injury but there is still eschar. Scant amount of purulent drainage some erythema medially extending up the heel. He has neuropathy no pain no systemic illness. Culture that we did last time showed moderate Proteus, moderate Enterococcus and moderate Pseudomonas. The Enterococcus will require ampicillin so he will require 2 antibiotics. He is using Santyl to the wound 10-31-2021 upon evaluation today patient appears to be doing well with regard to his wound all things considered. There is still necrotic tissue noted in the central portion of the wound which is not what we want to see but unfortunately I think this is  just could be the case until we ensure he has good blood flow and can subsequently get things moving in a better direction. Fortunately I do not see any evidence of infection locally or systemically which is great news and overall I think that we are on the right track in that regard. I am going to go ahead and refill the Augmentin as well as the Cipro for him today he was given originally 10 days worth I would have her extend that for 7 days. 11-07-2021 upon evaluation today patient appears to be doing better in regard to the overall appearance of his wound and I do feel like the Melburn Popper is doing a good job here. Fortunately there does not appear to be any evidence of active infection locally nor systemically which is great news and overall I am extremely pleased with where things stand today. The patient is happy as well with the progress. Nonetheless I had extended his Lobdell, Kayde W. ([id number]) Augmentin and Cipro for him at the last visit I think that is going to  need to be extended a bit further. That means he is probably can need some probiotics as well. 11-21-2021 upon evaluation today patient appears to be doing a little better in regard to size and is definitely making some progress here. With that being said he also definitely has some areas that are still fairly thick with necrotic tissue. We discussed debridement yet again today and he is amenable to doing some debridement as [place] as its not too aggressive which I think we can definitely do. I do believe the Melburn Popper is doing a great job and will definitely be continuing that as well. 12-05-2021 upon evaluation today patient appears to be doing well with regard to his heel. He has been tolerating the dressing changes without complication. Fortunately there does not appear to be any evidence of active infection locally or systemically at this time. No fevers, chills, nausea, vomiting, or diarrhea. 12-19-2021 upon evaluation today patient appears  to be doing excellent in regard to his wound. Has been tolerating the Santyl in an excellent fashion. Fortunately I do not see any evidence of infection locally or systemically which is great news and overall I am very pleased with where we stand today. Objective Constitutional Well-nourished and well-hydrated in no acute distress. Vitals Time Taken: 9:05 AM, Height: 68 in, Weight: 220 lbs, BMI: 33.4, Temperature: 98.4 F, Pulse: 78 bpm, Respiratory Rate: 18 breaths/min, Blood Pressure: 196/78 mmHg. Respiratory normal breathing without difficulty. Psychiatric this patient is able to make decisions and demonstrates good insight into disease process. Alert and Oriented x 3. pleasant and cooperative. General Notes: Upon inspection patient's wound bed actually did show signs of excellent granulation and epithelization at this point. Fortunately I do not see any signs of anything worsening overall I think he is on the right track here. No fevers, chills, nausea, vomiting, or diarrhea. Integumentary (Hair, Skin) Wound #1 status is Open. Original cause of wound was Pressure Injury. The date acquired was: 06/19/2021. The wound has been in treatment 19 weeks. The wound is located on the Left Calcaneus. The wound measures 2.5cm length x 3cm width x 0.2cm depth; 5.89cm^2 area and 1.178cm^3 volume. There is Fat Layer (Subcutaneous Tissue) exposed. There is no tunneling or undermining noted. There is a medium amount of serosanguineous drainage noted. There is small (1-33%) red, hyper - granulation within the wound bed. 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 There was a Chemical/Enzymatic/Mechanical debridement performed by Nelida Meuse., [name].Marland Kitchen Agent used was [employer]. A time out was conducted at 09:25, prior to the start of the procedure. There was no bleeding. The procedure was tolerated well with [name], Dontavian W. ([id number]) a pain level of 0 throughout and a pain level of 0 following the procedure. Post Debridement Measurements: 2.5cm length x 3cm width x 0.2cm depth; 1.178cm^3 volume. Character of Wound/Ulcer Post Debridement is improved. Severity of Tissue Post Debridement is: Fat layer exposed. Post procedure Diagnosis Wound #1: Same as Pre-Procedure Plan Follow-up Appointments: Return Appointment in 1 week. Nurse Visit as needed Bathing/ Shower/ Hygiene: Wash wounds with antibacterial soap and water. May shower; gently cleanse wound with antibacterial  soap, rinse and pat dry prior to dressing wounds - shower with dressing on and remove after shower No tub bath. Edema Control - Lymphedema / Segmental Compressive Device / Other: Elevate, Exercise Daily and Avoid Standing for [place] Periods of Time. Elevate leg(s) parallel to the floor when sitting. DO YOUR BEST to sleep in the bed at night. DO NOT sleep in your recliner. Bang hours of sitting in a recliner leads to swelling of the legs and/or potential wounds on your backside. WOUND #1: - Calcaneus Wound Laterality: Left Cleanser: Normal Saline Discharge Instructions: Wash your hands with soap and water. Remove old dressing, discard into plastic bag and place into trash. Cleanse the wound with Normal Saline prior to applying a clean dressing using gauze sponges, not tissues or cotton balls. Do not scrub or use excessive force. Pat dry using gauze sponges, not tissue or cotton balls. Primary Dressing: Santyl Collagenase Ointment, 30 (gm), tube Secondary  Dressing: ABD Pad 5x9 (in/in) Discharge Instructions: Cover with ABD pad Secondary Dressing: Kerlix 4.5 x 4.1 (in/yd) Discharge Instructions: Apply Kerlix 4.5 x 4.1 (in/yd) as instructed Secured With: Medipore Tape - 2M Medipore H Soft Cloth Surgical Tape, 2x2 (in/yd) 1. I would recommend that we going continue with wound care measures as before and patient is in agreement with plan. This includes the use of the Santyl which I do believe is doing a great job here. 2. Also can recommend that we continue with an ABD pad and roll gauze to secure in place. 3. I am also can recommend that we have the patient continue with appropriate offloading he is doing an awesome job in this regard. We will see patient back for reevaluation in 1 week here in the clinic. If anything worsens or changes patient will contact our office for additional recommendations. Electronic Signature(s) Signed: 12/19/2021 9:30:49 AM By: Lenda Kelp [name] Entered By: Lenda Kelp on 12/19/2021 09:30:49 Damore, Seaver W. ([id number]) -------------------------------------------------------------------------------- SuperBill Details Patient Name: Burdi, Ein W. Date of Service: 12/19/2021 Medical Record Number: [id number] Patient Account Number: [id number] Date of Birth/Sex: [birth year] (66 y.o. M) Treating RN: Yevonne Pax Primary Care Provider: [name] Other Clinician: Referring Provider: [name] Treating Provider/Extender: Rowan Blase in Treatment: 19 Diagnosis Coding ICD-10 Codes Code Description E11.621 Type 2 diabetes mellitus with foot ulcer L97.422 Non-pressure chronic ulcer of left heel and midfoot with fat layer exposed M86.672 Other chronic osteomyelitis, left ankle and foot I87.2 Venous insufficiency (chronic) (peripheral) I73.89 Other specified peripheral vascular diseases I10 Essential (primary) hypertension Facility Procedures CPT4 Code: [id number] Description: [phone] - DEBRIDE W/O ANES NON  SELECT Modifier: Quantity: 1 Physician Procedures CPT4 Code: [id number] Description: 99214 - WC PHYS LEVEL 4 - EST PT Modifier: Quantity: 1 CPT4 Code: Description: ICD-10 Diagnosis Description E11.621 Type 2 diabetes mellitus with foot ulcer L97.422 Non-pressure chronic ulcer of left heel and midfoot with fat layer ex A76.811 Other chronic osteomyelitis, left ankle and foot I87.2 Venous insufficiency  (chronic) (peripheral) Modifier: posed Quantity: Electronic Signature(s) Signed: 12/19/2021 9:31:06 AM By: Lenda Kelp [name] Entered By: Lenda Kelp on 12/19/2021 09:31:06

## 2021-12-19 NOTE — Progress Notes (Signed)
Jeppsen, Khylen Lacretia Nicks ([id number]) Visit Report for 12/19/2021 Arrival Information Details Patient Name: [name], [name] Date of Service: 12/19/2021 9:00 AM Medical Record Number: [id number] Patient Account Number: [id number] Date of Birth/Sex: [birth year] (66 y.o. M) Treating RN: Yevonne Pax Primary Care Ramesses Crampton: [name] Other Clinician: Referring Amai Cappiello: [name] Treating Keyasha Miah/Extender: Rowan Blase in Treatment: 19 Visit Information History Since Last Visit All ordered tests and consults were completed: No Patient Arrived: [name] Added or deleted any medications: No Arrival Time: 09:01 Any new allergies or adverse reactions: No Accompanied By: self Had a fall or experienced change in No Transfer Assistance: None activities of daily living that may affect Patient Identification Verified: Yes risk of falls: Secondary Verification Process Completed: Yes Signs or symptoms of abuse/neglect since last visito No Patient Requires Transmission-Based No Hospitalized since last visit: No Precautions: Implantable device outside of the clinic excluding No Patient Has Alerts: Yes cellular tissue based products placed in the center Patient Alerts: ABI R >1.0 [place] TBI since last visit: 0.50 Has Dressing in Place as Prescribed: Yes ABI L 0.98 Pain Present Now: No Electronic Signature(s) Signed: 12/19/2021 4:23:03 PM By: Yevonne Pax RN Entered By: Yevonne Pax on 12/19/2021 09:05:19 Sobczyk, Eliyohu W. ([id number]) -------------------------------------------------------------------------------- Clinic Level of Care Assessment Details Patient Name: [name], [name] Date of Service: 12/19/2021 9:00 AM Medical Record Number: [id number] Patient Account Number: [id number] Date of Birth/Sex: 1955-11-29 (66 y.o. M) Treating RN: Yevonne Pax Primary Care Iridessa Harrow: [name] Other Clinician: Referring Chera Slivka: [name] Treating Crit Obremski/Extender: Rowan Blase in Treatment: 19 Clinic  Level of Care Assessment Items TOOL 1 Quantity Score []  - Use when EandM and Procedure is performed on INITIAL visit 0 ASSESSMENTS - Nursing Assessment / Reassessment []  - General Physical Exam (combine w/ comprehensive assessment (listed just below) when performed on new 0 pt. evals) []  - 0 Comprehensive Assessment (HX, ROS, Risk Assessments, Wounds Hx, etc.) ASSESSMENTS - Wound and Skin Assessment / Reassessment []  - Dermatologic / Skin Assessment (not related to wound area) 0 ASSESSMENTS - Ostomy and/or Continence Assessment and Care []  - Incontinence Assessment and Management 0 []  - 0 Ostomy Care Assessment and Management (repouching, etc.) PROCESS - Coordination of Care []  - Simple Patient / Family Education for ongoing care 0 []  - 0 Complex (extensive) Patient / Family Education for ongoing care []  - 0 Staff obtains , Records, Test Results / Process Orders []  - 0 Staff telephones HHA, Nursing Homes / Clarify orders / etc []  - 0 Routine Transfer to another Facility (non-emergent condition) []  - 0 Routine Hospital Admission (non-emergent condition) []  - 0 New Admissions / / Ordering NPWT, Apligraf, etc. []  - 0 Emergency Hospital Admission (emergent condition) PROCESS - Special Needs []  - Pediatric / Minor Patient Management 0 []  - 0 Isolation Patient Management []  - 0 Hearing / Language / Visual special needs []  - 0 Assessment of Community assistance (transportation, D/C planning, etc.) []  - 0 Additional assistance / Altered mentation []  - 0 Support Surface(s) Assessment (bed, cushion, seat, etc.) INTERVENTIONS - Miscellaneous []  - External ear exam 0 []  - 0 Patient Transfer (multiple staff / / Similar devices) []  - 0 Simple Staple / Suture removal (25 or less) []  - 0 Complex Staple / Suture removal (26 or more) []  - 0 Hypo/Hyperglycemic Management (do not check if billed separately) []  - 0 Ankle / Brachial Index  (ABI) - do not check if billed separately Has the patient been seen at the  hospital within the last three years: Yes Total Score: 0 Level Of Care: ____ [name] ([id number]) Electronic Signature(s) Signed: 12/19/2021 4:23:03 PM By: Carlene Coria RN Entered By: Carlene Coria on 12/19/2021 09:26:14 Shannahan, Tawan W. ([id number]) -------------------------------------------------------------------------------- Encounter Discharge Information Details Patient Name: [name], [name] Date of Service: 12/19/2021 9:00 AM Medical Record Number: [id number] Patient Account Number: [id number] Date of Birth/Sex: [birth year] (66 y.o. M) Treating RN: Carlene Coria Primary Care Zyshawn Bohnenkamp: Alysia Penna Other Clinician: Referring Kazuko Clemence: Alysia Penna Treating Anjolaoluwa Siguenza/Extender: Skipper Cliche in Treatment: 19 Encounter Discharge Information Items Post Procedure Vitals Discharge Condition: Stable Temperature (F): 98.4 Ambulatory Status: Ambulatory Pulse (bpm): 78 Discharge Destination: Home Respiratory Rate (breaths/min): 18 Transportation: Private Auto Blood Pressure (mmHg): 196/78 Accompanied By: self Schedule Follow-up Appointment: Yes Clinical Summary of Care: Electronic Signature(s) Signed: 12/19/2021 4:23:03 PM By: Carlene Coria RN Entered By: Carlene Coria on 12/19/2021 09:27:13 Mignogna, Mustaf W. ([id number]) -------------------------------------------------------------------------------- Lower Extremity Assessment Details Patient Name: [name], [name] Date of Service: 12/19/2021 9:00 AM Medical Record Number: [id number] Patient Account Number: [id number] Date of Birth/Sex: March 17, 1956 (66 y.o. M) Treating RN: Carlene Coria Primary Care Alainah Phang: Alysia Penna Other Clinician: Referring Glena Pharris: Alysia Penna Treating Glena Pharris/Extender: Jeri Cos Weeks in Treatment: 19 Edema Assessment Assessed: [Left: No] Patrice Paradise: No] Edema: [Left: Ye] [Right: s] Calf Left: Right: Point of Measurement: 30 cm From  Medial Instep 35 cm Ankle Left: Right: Point of Measurement: 10 cm From Medial Instep 24 cm Vascular Assessment Pulses: Dorsalis Pedis Palpable: [Left:Yes] Electronic Signature(s) Signed: 12/19/2021 4:23:03 PM By: Carlene Coria RN Entered By: Carlene Coria on 12/19/2021 09:11:43 Sangha, Jacorey W. ([id number]) -------------------------------------------------------------------------------- Multi Wound Chart Details Patient Name: [name], [name] Date of Service: 12/19/2021 9:00 AM Medical Record Number: [id number] Patient Account Number: [id number] Date of Birth/Sex: September 22, 1955 (66 y.o. M) Treating RN: Carlene Coria Primary Care Skylur Fuston: Alysia Penna Other Clinician: Referring Diva Lemberger: Alysia Penna Treating Rahkeem Senft/Extender: Skipper Cliche in Treatment: 19 Vital Signs Height(in): 68 Pulse(bpm): 77 Weight(lbs): 220 Blood Pressure(mmHg): 196/78 Body Mass Index(BMI): 33.4 Temperature(F): 98.4 Respiratory Rate(breaths/min): 18 Photos: [N/A:N/A] Wound Location: Left Calcaneus N/A N/A Wounding Event: Pressure Injury N/A N/A Primary Etiology: Diabetic Wound/Ulcer of the Lower N/A N/A Extremity Comorbid History: Cataracts, Hypertension, Type II N/A N/A Diabetes, Neuropathy Date Acquired: 06/19/2021 N/A N/A Weeks of Treatment: 19 N/A N/A Wound Status: Open N/A N/A Wound Recurrence: No N/A N/A Measurements L x W x D (cm) 2.5x3x0.2 N/A N/A Area (cm) : 5.89 N/A N/A Volume (cm) : 1.178 N/A N/A % Reduction in Area: -159.50% N/A N/A % Reduction in Volume: -418.90% N/A N/A Classification: Grade 2 N/A N/A Exudate Amount: Medium N/A N/A Exudate Type: Serosanguineous N/A N/A Exudate Color: red, brown N/A N/A Granulation Amount: Small (1-33%) N/A N/A Granulation Quality: Red, Hyper-granulation N/A N/A Exposed Structures: Fat Layer (Subcutaneous Tissue): N/A N/A Yes Epithelialization: Small (1-33%) N/A N/A Treatment Notes Electronic Signature(s) Signed: 12/19/2021 4:23:03 PM By: Carlene Coria RN Entered By: Carlene Coria on 12/19/2021 09:12:44 Duby, Trentan W. ([id number]) -------------------------------------------------------------------------------- [place] Details Patient Name: [name], [name] Date of Service: 12/19/2021 9:00 AM Medical Record Number: [id number] Patient Account Number: [id number] Date of Birth/Sex: February 09, 1956 (66 y.o. M) Treating RN: Carlene Coria Primary Care Hodari Chuba: Alysia Penna Other Clinician: Referring Artez Regis: Alysia Penna Treating Shigeru Lampert/Extender: Skipper Cliche in Treatment: 19 Active Inactive Wound/Skin Impairment Nursing Diagnoses: Impaired tissue integrity Knowledge deficit related to ulceration/compromised skin integrity Goals: Ulcer/skin breakdown will have a volume reduction of 30% by week 4 Date Initiated:  08/08/2021 Date Inactivated: 12/19/2021 Target Resolution Date: 09/05/2021 Goal Status: Unmet Unmet Reason: comorbities Ulcer/skin breakdown will have a volume reduction of 50% by week 8 Date Initiated: 08/08/2021 Date Inactivated: 12/19/2021 Target Resolution Date: 10/03/2021 Goal Status: Unmet Unmet Reason: comorbities Ulcer/skin breakdown will have a volume reduction of 80% by week 12 Date Initiated: 08/08/2021 Date Inactivated: 12/19/2021 Target Resolution Date: 10/31/2021 Goal Status: Unmet Unmet Reason: comorbities Ulcer/skin breakdown will heal within 14 weeks Date Initiated: 08/08/2021 Target Resolution Date: 11/14/2021 Goal Status: Active Interventions: Assess patient/caregiver ability to obtain necessary supplies Assess patient/caregiver ability to perform ulcer/skin care regimen upon admission and as needed Assess ulceration(s) every visit Provide education on ulcer and skin care Notes: Electronic Signature(s) Signed: 12/19/2021 4:23:03 PM By: Carlene Coria RN Entered By: Carlene Coria on 12/19/2021 09:12:30 Angert, Marton W.  ([id number]) -------------------------------------------------------------------------------- Pain Assessment Details Patient Name: [name], [name] Date of Service: 12/19/2021 9:00 AM Medical Record Number: [id number] Patient Account Number: [id number] Date of Birth/Sex: 1955-09-04 (66 y.o. M) Treating RN: Carlene Coria Primary Care Viral Schramm: Alysia Penna Other Clinician: Referring Monterrio Gerst: Alysia Penna Treating Lirio Bach/Extender: Skipper Cliche in Treatment: 19 Active Problems Location of Pain Severity and Description of Pain Patient Has Paino No Site Locations Pain Management and Medication Current Pain Management: Electronic Signature(s) Signed: 12/19/2021 4:23:03 PM By: Carlene Coria RN Entered By: Carlene Coria on 12/19/2021 09:05:53 [name], [name] ([id number]) -------------------------------------------------------------------------------- Patient/Caregiver Education Details Patient Name: [name], [name] Date of Service: 12/19/2021 9:00 AM Medical Record Number: [id number] Patient Account Number: [id number] Date of Birth/Gender: Jan 31, 1956 (66 y.o. M) Treating RN: Carlene Coria Primary Care Physician: Alysia Penna Other Clinician: Referring Physician: Alysia Penna Treating Physician/Extender: Skipper Cliche in Treatment: 19 Education Assessment Education Provided To: Patient Education Topics Provided Wound/Skin Impairment: Methods: Explain/Verbal Responses: State content correctly Electronic Signature(s) Signed: 12/19/2021 4:23:03 PM By: Carlene Coria RN Entered By: Carlene Coria on 12/19/2021 09:26:29 [name], [name] ([id number]) -------------------------------------------------------------------------------- Wound Assessment Details Patient Name: [name], [name] Date of Service: 12/19/2021 9:00 AM Medical Record Number: [id number] Patient Account Number: [id number] Date of Birth/Sex: [birth year] (66 y.o. M) Treating RN: Carlene Coria Primary Care Ahlijah Raia: Alysia Penna Other  Clinician: Referring Lilly Gasser: Alysia Penna Treating Shalanda Brogden/Extender: Skipper Cliche in Treatment: 19 Wound Status Wound Number: 1 Primary Etiology: Diabetic Wound/Ulcer of the Lower Extremity Wound Location: Left Calcaneus Wound Status: Open Wounding Event: Pressure Injury Comorbid Cataracts, Hypertension, Type II Diabetes, History: Neuropathy Date Acquired: 06/19/2021 Weeks Of Treatment: 19 Clustered Wound: No Photos Wound Measurements Length: (cm) 2.5 Width: (cm) 3 Depth: (cm) 0.2 Area: (cm) 5.89 Volume: (cm) 1.178 % Reduction in Area: -159.5% % Reduction in Volume: -418.9% Epithelialization: Small (1-33%) Tunneling: No Undermining: No Wound Description Classification: Grade 2 Exudate Amount: Medium Exudate Type: Serosanguineous Exudate Color: red, brown Foul Odor After Cleansing: No Slough/Fibrino Yes Wound Bed Granulation Amount: Small (1-33%) Exposed Structure Granulation Quality: Red, Hyper-granulation Fat Layer (Subcutaneous Tissue) Exposed: Yes Treatment Notes Wound #1 (Calcaneus) Wound Laterality: Left Cleanser Normal Saline Discharge Instruction: Wash your hands with soap and water. Remove old dressing, discard into plastic bag and place into trash. Cleanse the wound with Normal Saline prior to applying a clean dressing using gauze sponges, not tissues or cotton balls. Do not scrub or use excessive force. Pat dry using gauze sponges, not tissue or cotton balls. Peri-Wound Care Topical Primary Dressing Santyl Collagenase Ointment, 30 (gm), tube Shinn, Tyrell W. ([id number]) Secondary Dressing ABD Pad 5x9 (in/in) Discharge Instruction: Cover with ABD pad Kerlix 4.5 x 4.1 (  in/yd) Discharge Instruction: Apply Kerlix 4.5 x 4.1 (in/yd) as instructed Secured With Medipore Tape - 52M Medipore H Soft Cloth Surgical Tape, 2x2 (in/yd) Compression Wrap Compression Stockings Add-Ons Electronic Signature(s) Signed: 12/19/2021 4:23:03 PM By: Yevonne Pax  RN Entered By: Yevonne Pax on 12/19/2021 09:10:54 [name], [name] ([id number]) -------------------------------------------------------------------------------- Vitals Details Patient Name: [name], [name] Date of Service: 12/19/2021 9:00 AM Medical Record Number: [id number] Patient Account Number: [id number] Date of Birth/Sex: [birth year] (66 y.o. M) Treating RN: Yevonne Pax Primary Care Maclain Cohron: [name] Other Clinician: Referring Lyndon Chenoweth: [name] Treating Sorrel Cassetta/Extender: Rowan Blase in Treatment: 19 Vital Signs Time Taken: 09:05 Temperature (F): 98.4 Height (in): 68 Pulse (bpm): 78 Weight (lbs): 220 Respiratory Rate (breaths/min): 18 Body Mass Index (BMI): 33.4 Blood Pressure (mmHg): 196/78 Reference Range: 80 - 120 mg / dl Electronic Signature(s) Signed: 12/19/2021 4:23:03 PM By: Yevonne Pax RN Entered By: Yevonne Pax on 12/19/2021 09:05:39

## 2021-12-29 ENCOUNTER — Encounter: Payer: Self-pay | Admitting: Family Medicine

## 2021-12-30 NOTE — Telephone Encounter (Signed)
He needs an OV for all this

## 2022-01-02 ENCOUNTER — Encounter: Payer: Self-pay | Admitting: Family Medicine

## 2022-01-02 ENCOUNTER — Encounter: Payer: No Typology Code available for payment source | Admitting: Physician Assistant

## 2022-01-02 ENCOUNTER — Ambulatory Visit (INDEPENDENT_AMBULATORY_CARE_PROVIDER_SITE_OTHER): Payer: No Typology Code available for payment source | Admitting: Family Medicine

## 2022-01-02 VITALS — BP 142/76 | HR 71 | Temp 98.8°F | Wt 222.0 lb

## 2022-01-02 DIAGNOSIS — E1140 Type 2 diabetes mellitus with diabetic neuropathy, unspecified: Secondary | ICD-10-CM

## 2022-01-02 DIAGNOSIS — E113393 Type 2 diabetes mellitus with moderate nonproliferative diabetic retinopathy without macular edema, bilateral: Secondary | ICD-10-CM

## 2022-01-02 DIAGNOSIS — E11621 Type 2 diabetes mellitus with foot ulcer: Secondary | ICD-10-CM | POA: Diagnosis not present

## 2022-01-02 DIAGNOSIS — Z794 Long term (current) use of insulin: Secondary | ICD-10-CM

## 2022-01-02 DIAGNOSIS — E11319 Type 2 diabetes mellitus with unspecified diabetic retinopathy without macular edema: Secondary | ICD-10-CM | POA: Insufficient documentation

## 2022-01-02 DIAGNOSIS — I10 Essential (primary) hypertension: Secondary | ICD-10-CM | POA: Diagnosis not present

## 2022-01-02 LAB — POCT GLYCOSYLATED HEMOGLOBIN (HGB A1C): Hemoglobin A1C: 6.6 % — AB (ref 4.0–5.6)

## 2022-01-02 MED ORDER — NOVOLIN 70/30 FLEXPEN (70-30) 100 UNIT/ML ~~LOC~~ SUPN
15.0000 [IU] | PEN_INJECTOR | Freq: Two times a day (BID) | SUBCUTANEOUS | 3 refills | Status: DC
Start: 2022-01-02 — End: 2022-02-03

## 2022-01-02 NOTE — Progress Notes (Signed)
   Subjective:    Patient ID: [name], male    DOB: [birth year], 66 y.o.   MRN: [id number]  HPI Here to follow up on HTN and diabetes. Overall he feels well. His BP the past few checks have been in the 130s -140s/80s. As for the diabetes, he has been getting fairly labile glucoses lately. His am fasting glucose averages from 110 as high as 200, depending on what he ate the night before. The last A1c we checked in March was 5.9. he has been experimenting with his insulin dose, ranging from 15 to 25 units at a time. He has been seeing the podiatrist frequently for a left foot ulcer, but it sounds like this is slowly healing in. He is seeing Dr. 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 The A1c today is 6.6%.   Review of Systems  Constitutional: Negative.   Respiratory: Negative.    Cardiovascular: Negative.        Objective:   Physical Exam Constitutional:      Appearance: Normal appearance.     Comments: Walks with a cane   Cardiovascular:     Rate and Rhythm: Normal rate and regular rhythm.     Pulses: Normal pulses.     Heart sounds: Normal heart sounds.  Pulmonary:     Effort: Pulmonary effort is normal.     Breath sounds: Normal breath sounds.  Neurological:     Mental Status: He is alert.           Assessment & Plan:  His HTN is well controlled so we will stay with the current regimen. His diabetes has been slightly out of control, so we will increase his dose of Novolin 70/30 to 25 units BID. Recheck in 90 days. [name], MD

## 2022-01-04 NOTE — Progress Notes (Signed)
[name] ([id number]) Visit Report for 01/02/2022 Arrival Information Details Patient Name: [name], [name] Date of Service: 01/02/2022 8:15 AM Medical Record Number: [id number] Patient Account Number: [id number] Date of Birth/Sex: 04/25/1956 (66 y.o. M) Treating RN: [name] Primary Care [name]: [name] Other Clinician: Massie [name] Referring [name]: [name] Treating [name]/Extender: [name] in [name]: 21 Visit Information History Since Last Visit All ordered tests and consults were completed: No Patient Arrived: [name] Added or deleted any medications: No Arrival Time: 08:10 Any new allergies or adverse reactions: No Transfer Assistance: None Had a fall or experienced change in No Patient Requires Transmission-Based No activities of daily living that may affect Precautions: risk of falls: Patient Has Alerts: Yes Hospitalized since last visit: No Patient Alerts: ABI R >1.0 [place] TBI Pain Present Now: No 0.50 ABI L 0.98 Electronic Signature(s) Signed: 01/04/2022 8:34:35 AM By: [name] Entered By: [name] on 01/02/2022 08:15:47 [name], [name] -------------------------------------------------------------------------------- Clinic Level of Care Assessment Details Patient Name: [name], [name] Date of Service: 01/02/2022 8:15 AM Medical Record Number: [id number] Patient Account Number: [id number] Date of Birth/Sex: [birth year] (66 y.o. M) Treating RN: [name] Primary Care [name]: [name] Other Clinician: Massie [name] Referring [name]: [name] Treating [name]/Extender: [name] in [name]: 21 Clinic Level of Care Assessment Items TOOL 1 Quantity Score []  - Use when EandM and Procedure is performed on INITIAL visit 0 ASSESSMENTS - Nursing Assessment / Reassessment []  - General Physical Exam (combine w/ comprehensive assessment (listed just below) when performed on new 0 pt.  evals) []  - 0 Comprehensive Assessment (HX, ROS, Risk Assessments, Wounds Hx, etc.) ASSESSMENTS - Wound and Skin Assessment / Reassessment []  - Dermatologic / Skin Assessment (not related to wound area) 0 ASSESSMENTS - Ostomy and/or Continence Assessment and Care []  - Incontinence Assessment and Management 0 []  - 0 Ostomy Care Assessment and Management (repouching, etc.) PROCESS - Coordination of Care []  - Simple Patient / Family Education for ongoing care 0 []  - 0 Complex (extensive) Patient / Family Education for ongoing care []  - 0 Staff obtains Programmer, systems, Records, Test Results / Process Orders []  - 0 Staff telephones HHA, Nursing Homes / Clarify orders / etc []  - 0 Routine Transfer to another Facility (non-emergent condition) []  - 0 Routine Hospital Admission (non-emergent condition) []  - 0 New Admissions / Biomedical engineer / Ordering NPWT, Apligraf, etc. []  - 0 Emergency Hospital Admission (emergent condition) PROCESS - Special Needs []  - Pediatric / Minor Patient Management 0 []  - 0 Isolation Patient Management []  - 0 Hearing / Language / Visual special needs []  - 0 Assessment of Community assistance (transportation, D/C planning, etc.) []  - 0 Additional assistance / Altered mentation []  - 0 Support Surface(s) Assessment (bed, cushion, seat, etc.) INTERVENTIONS - Miscellaneous []  - External ear exam 0 []  - 0 Patient Transfer (multiple staff / Civil Service fast streamer / Similar devices) []  - 0 Simple Staple / Suture removal (25 or less) []  - 0 Complex Staple / Suture removal (26 or more) []  - 0 Hypo/Hyperglycemic Management (do not check if billed separately) []  - 0 Ankle / Brachial Index (ABI) - do not check if billed separately Has the patient been seen at the hospital within the last three years: Yes Total Score: 0 Level Of Care: ____ [name] ([id number]) Electronic Signature(s) Signed: 01/04/2022 8:34:35 AM By: [name] Entered By: [name] on  01/02/2022 08:51:56 [name], [name] -------------------------------------------------------------------------------- Encounter Discharge Information Details Patient  Name: [name], [name] Date of Service: 01/02/2022 8:15 AM Medical Record Number: [id number] Patient Account Number: [id number] Date of Birth/Sex: [birth year] (66 y.o. M) Treating RN: [name] Primary Care [name]: [name] Other Clinician: Betha [name] Referring Donevan Biller: [name] Treating [name]/Extender: [name] in [name]: 21 Encounter Discharge Information Items Post Procedure Vitals Discharge Condition: Stable Temperature (F): 98.0 Ambulatory Status: Cane Pulse (bpm): 83 Discharge Destination: Home Respiratory Rate (breaths/min): 18 Transportation: Private Auto Blood Pressure (mmHg): 170/88 Accompanied By: self Schedule Follow-up Appointment: Yes Clinical Summary of Care: Electronic Signature(s) Signed: 01/04/2022 8:34:35 AM By: [name] Entered By: [name] on 01/02/2022 09:02:08 [name], [name] -------------------------------------------------------------------------------- Lower Extremity Assessment Details Patient Name: [name], [name] Date of Service: 01/02/2022 8:15 AM Medical Record Number: [id number] Patient Account Number: [id number] Date of Birth/Sex: 07-20-1955 (66 y.o. M) Treating RN: [name] Primary Care Aaryn Parrilla: [name] Other Clinician: Betha [name] Referring Cassadi Purdie: [name] Treating Annissa Andreoni/Extender: [name]: 21 Edema Assessment Assessed: [Left: No] Franne Forts: No] [Left: Edema] [Right: :] Calf Left: Right: Point of Measurement: 30 cm From Medial Instep 37 cm Ankle Left: Right: Point of Measurement: 10 cm From Medial Instep 24.3 cm Vascular Assessment Pulses: Dorsalis Pedis Palpable: [Left:Yes] Posterior Tibial Palpable: [Left:Yes] Electronic Signature(s) Signed: 01/02/2022 4:46:59  PM By: [name] Signed: 01/04/2022 8:34:35 AM By: [name] Entered By: [name] on 01/02/2022 08:25:23 [name], [name] -------------------------------------------------------------------------------- Multi Wound Chart Details Patient Name: [name], [name] Date of Service: 01/02/2022 8:15 AM Medical Record Number: [id number] Patient Account Number: [id number] Date of Birth/Sex: April 01, 1956 (66 y.o. M) Treating RN: [name] Primary Care Joselynne Killam: [name] Other Clinician: Betha [name] Referring Riordan Walle: [name] Treating Darien Kading/Extender: [name] in [name]: 21 Vital Signs Height(in): 68 Pulse(bpm): 83 Weight(lbs): 220 Blood Pressure(mmHg): 170/88 Body Mass Index(BMI): 33.4 Temperature(F): 98.0 Respiratory Rate(breaths/min): 18 Photos: [N/A:N/A] Wound Location: Left Calcaneus N/A N/A Wounding Event: Pressure Injury N/A N/A Primary Etiology: Diabetic Wound/Ulcer of the Lower N/A N/A Extremity Comorbid History: Cataracts, Hypertension, Type II N/A N/A Diabetes, Neuropathy Date Acquired: 06/19/2021 N/A N/A Weeks of [name]: 21 N/A N/A Wound Status: Open N/A N/A Wound Recurrence: No N/A N/A Measurements L x W x D (cm) 2.5x2.5x0.2 N/A N/A Area (cm) : 4.909 N/A N/A Volume (cm) : 0.982 N/A N/A % Reduction in Area: -116.30% N/A N/A % Reduction in Volume: -332.60% N/A N/A Classification: Grade 2 N/A N/A Exudate Amount: Medium N/A N/A Exudate Type: Serosanguineous N/A N/A Exudate Color: red, brown N/A N/A Granulation Amount: Small (1-33%) N/A N/A Granulation Quality: Red, Hyper-granulation N/A N/A Exposed Structures: Fat Layer (Subcutaneous Tissue): N/A N/A Yes Epithelialization: Small (1-33%) N/A N/A [name] Notes Electronic Signature(s) Signed: 01/04/2022 8:34:35 AM By: [name] Entered By: [name] on 01/02/2022 08:25:48 [name], [name]  ([id number]) -------------------------------------------------------------------------------- Multi-Disciplinary Care Plan Details Patient Name: Fronczak, Gabrian W. Date of Service: 01/02/2022 8:15 AM Medical Record Number: [id number] Patient Account Number: [id number] Date of Birth/Sex: 12-14-1955 (66 y.o. M) Treating RN: [name] Primary Care Tiberius Loftus: [name] Other Clinician: Betha [name] Referring Gaius Ishaq: [name] Treating Fredis Malkiewicz/Extender: [name] in [name]: 21 Active Inactive Wound/Skin Impairment Nursing Diagnoses: Impaired tissue integrity Knowledge deficit related to ulceration/compromised skin integrity Goals: Ulcer/skin breakdown will have a volume reduction of 30% by week 4 Date Initiated: 08/08/2021 Date Inactivated: 12/19/2021 Target Resolution Date: 09/05/2021 Goal Status: Unmet Unmet Reason: comorbities Ulcer/skin breakdown will have a volume reduction of 50% by week 8 Date Initiated: 08/08/2021 Date  Inactivated: 12/19/2021 Target Resolution Date: 10/03/2021 Goal Status: Unmet Unmet Reason: comorbities Ulcer/skin breakdown will have a volume reduction of 80% by week 12 Date Initiated: 08/08/2021 Date Inactivated: 12/19/2021 Target Resolution Date: 10/31/2021 Goal Status: Unmet Unmet Reason: comorbities Ulcer/skin breakdown will heal within 14 weeks Date Initiated: 08/08/2021 Target Resolution Date: 11/14/2021 Goal Status: Active Interventions: Assess patient/caregiver ability to obtain necessary supplies Assess patient/caregiver ability to perform ulcer/skin care regimen upon admission and as needed Assess ulceration(s) every visit Provide education on ulcer and skin care Notes: Electronic Signature(s) Signed: 01/02/2022 4:46:59 PM By: [name] Signed: 01/04/2022 8:34:35 AM By: [name] Entered By: [name] on 01/02/2022 08:25:40 [name], [name]  ([id number]) -------------------------------------------------------------------------------- Pain Assessment Details Patient Name: [name], [name] Date of Service: 01/02/2022 8:15 AM Medical Record Number: [id number] Patient Account Number: [id number] Date of Birth/Sex: 27-Nov-1955 (66 y.o. M) Treating RN: [name] Primary Care Toni Demo: [name] Other Clinician: Massie [name] Referring Niva Murren: [name] Treating Awais Cobarrubias/Extender: [name] in [name]: 21 Active Problems Location of Pain Severity and Description of Pain Patient Has Paino No Site Locations Pain Management and Medication Current Pain Management: Electronic Signature(s) Signed: 01/02/2022 4:46:59 PM By: [name] Signed: 01/04/2022 8:34:35 AM By: [name] Entered By: [name] on 01/02/2022 08:17:48 Abramo, Kaleth W. ([id number]) -------------------------------------------------------------------------------- Patient/Caregiver Education Details Patient Name: Aguirre, [name] Date of Service: 01/02/2022 8:15 AM Medical Record Number: [id number] Patient Account Number: [id number] Date of Birth/Gender: 09-26-1955 (66 y.o. M) Treating RN: [name] Primary Care Physician: [name] Other Clinician: Massie [name] Referring Physician: Alysia [name] Treating Physician/Extender: [name] in [name]: 21 Education Assessment Education Provided To: Patient Education Topics Provided Wound/Skin Impairment: Handouts: Other: continue wound care as directed Electronic Signature(s) Signed: 01/04/2022 8:34:35 AM By: [name] Entered By: [name] on 01/02/2022 08:52:41 Konkle, Taylor W. ([id number]) -------------------------------------------------------------------------------- Wound Assessment Details Patient Name: Emery, Carlas W. Date of Service: 01/02/2022 8:15 AM Medical Record Number: [id number] Patient Account Number: [id number] Date of Birth/Sex: 24-Jan-1956 (66  y.o. M) Treating RN: [name] Primary Care Czarina Gingras: [name] Other Clinician: Massie [name] Referring Tayven Renteria: [name] Treating Halen Antenucci/Extender: [name] in [name]: 21 Wound Status Wound Number: 1 Primary Etiology: Diabetic Wound/Ulcer of the Lower Extremity Wound Location: Left Calcaneus Wound Status: Open Wounding Event: Pressure Injury Comorbid Cataracts, Hypertension, Type II Diabetes, History: Neuropathy Date Acquired: 06/19/2021 Weeks Of [name]: 21 Clustered Wound: No Photos Wound Measurements Length: (cm) 2.5 Width: (cm) 2.5 Depth: (cm) 0.2 Area: (cm) 4.909 Volume: (cm) 0.982 % Reduction in Area: -116.3% % Reduction in Volume: -332.6% Epithelialization: Small (1-33%) Wound Description Classification: Grade 2 Exudate Amount: Medium Exudate Type: Serosanguineous Exudate Color: red, brown Foul Odor After Cleansing: No Slough/Fibrino Yes Wound Bed Granulation Amount: Small (1-33%) Exposed Structure Granulation Quality: Red, Hyper-granulation Fat Layer (Subcutaneous Tissue) Exposed: Yes [name] Notes Wound #1 (Calcaneus) Wound Laterality: Left Cleanser Normal Saline Discharge Instruction: Wash your hands with soap and water. Remove old dressing, discard into plastic bag and place into trash. Cleanse the wound with Normal Saline prior to applying a clean dressing using gauze sponges, not tissues or cotton balls. Do not scrub or use excessive force. Pat dry using gauze sponges, not tissue or cotton balls. Peri-Wound Care Topical Primary Dressing Prisma 4.34 (in) Lapinsky, Zylon W. ([id number]) Discharge Instruction: Moisten w/normal saline or sterile water; Cover wound as directed. Do not remove from wound bed. Secondary Dressing ABD Pad 5x9 (in/in) Discharge Instruction: Cover with ABD pad Kerlix 4.5 x 4.1 (in/yd)  Discharge Instruction: Apply Kerlix 4.5 x 4.1 (in/yd) as instructed Secured With Medipore Tape - 48M Medipore H  Soft Cloth Surgical Tape, 2x2 (in/yd) Compression Wrap Compression Stockings Add-Ons Electronic Signature(s) Signed: 01/02/2022 4:46:59 PM By: [name] Signed: 01/04/2022 8:34:35 AM By: [name] Entered By: [name] on 01/02/2022 08:23:45 Pianka, Cardell W. ([id number]) -------------------------------------------------------------------------------- Vitals Details Patient Name: Norling, Isidro W. Date of Service: 01/02/2022 8:15 AM Medical Record Number: [id number] Patient Account Number: [id number] Date of Birth/Sex: 05/18/1956 (66 y.o. M) Treating RN: [name] Primary Care Parris Cudworth: [name] Other Clinician: Betha [name] Referring Aceson Labell: [name] Treating Keevon Henney/Extender: [name] in [name]: 21 Vital Signs Time Taken: 08:15 Temperature (F): 98.0 Height (in): 68 Pulse (bpm): 83 Weight (lbs): 220 Respiratory Rate (breaths/min): 18 Body Mass Index (BMI): 33.4 Blood Pressure (mmHg): 170/88 Reference Range: 80 - 120 mg / dl Electronic Signature(s) Signed: 01/04/2022 8:34:35 AM By: [name] Entered By: [name] on 01/02/2022 08:17:39

## 2022-01-04 NOTE — Progress Notes (Signed)
Yogi, Nil Lacretia Nicks ([id number]) Visit Report for 01/02/2022 Chief Complaint Document Details Patient Name: Cothron, Haruki W. Date of Service: 01/02/2022 8:15 AM Medical Record Number: [id number] Patient Account Number: [id number] Date of Birth/Sex: May 21, 1956 (66 y.o. M) Treating RN: Angelina Pih Primary Care Provider: [name] Other Clinician: Betha Loa Referring Provider: [name] Treating Provider/Extender: Rowan Blase in Treatment: 21 Information Obtained from: Patient Chief Complaint Left heel ulcer Electronic Signature(s) Signed: 01/03/2022 5:53:34 PM By: Lenda Kelp PA-C Entered By: Lenda Kelp on 01/02/2022 08:22:43 Wos, Haleem W. ([id number]) -------------------------------------------------------------------------------- Debridement Details Patient Name: Dickison, Harrie W. Date of Service: 01/02/2022 8:15 AM Medical Record Number: [id number] Patient Account Number: [id number] Date of Birth/Sex: 1956-04-03 (66 y.o. M) Treating RN: Angelina Pih Primary Care Provider: [name] Other Clinician: Betha Loa Referring Provider: [name] Treating Provider/Extender: Rowan Blase in Treatment: 21 Debridement Performed for Wound #1 Left Calcaneus Assessment: Performed By: Physician Nelida Meuse., PA-C Debridement Type: Debridement Severity of Tissue Pre Debridement: Fat layer exposed Level of Consciousness (Pre- Awake and Alert procedure): Pre-procedure Verification/Time Out Yes - 08:46 Taken: Start Time: 08:46 Total Area Debrided (L x W): 3 (cm) x 3 (cm) = 9 (cm) Tissue and other material Viable, Non-Viable, Callus, Slough, Subcutaneous, Biofilm, Slough debrided: Level: Skin/Subcutaneous Tissue Debridement Description: Excisional Instrument: Curette Bleeding: Minimum Hemostasis Achieved: Pressure End Time: 08:50 Response to Treatment: Procedure was tolerated well Level of Consciousness (Post- Awake and Alert procedure): Post  Debridement Measurements of Total Wound Length: (cm) 2.5 Width: (cm) 2.5 Depth: (cm) 0.2 Volume: (cm) 0.982 Character of Wound/Ulcer Post Debridement: Stable Severity of Tissue Post Debridement: Fat layer exposed Post Procedure Diagnosis Same as Pre-procedure Electronic Signature(s) Signed: 01/02/2022 4:46:59 PM By: Angelina Pih Signed: 01/03/2022 5:53:34 PM By: Lenda Kelp PA-C Signed: 01/04/2022 8:34:35 AM By: Betha Loa Entered By: Betha Loa on 01/02/2022 08:51:33 Nazaire, Kalyn W. ([id number]) -------------------------------------------------------------------------------- HPI Details Patient Name: Raglin, Supreme W. Date of Service: 01/02/2022 8:15 AM Medical Record Number: [id number] Patient Account Number: [id number] Date of Birth/Sex: Oct 30, 1955 (66 y.o. M) Treating RN: Angelina Pih Primary Care Provider: [name] Other Clinician: Betha Loa Referring Provider: [name] Treating Provider/Extender: Rowan Blase in Treatment: 21 History of Present Illness HPI Description: 08/08/2021 upon evaluation today patient appears to be doing somewhat poorly in regard to a heel ulcer that began as a result of the patient tells me him trying to get his foot into his shoes that were somewhat tight. This was around 4 January. He was using a shoehorn fairly aggressively and thinks that he may have caused the bruising at that time. Fortunately there does not appear to be any signs of infection at this point. Nonetheless he does have some definite necrotic tissue. This can need to be cleared away. Nonetheless unfortunately [name] we do not have clearance in my opinion therefore to go forward with an aggressive sharp debridement. Patient does have diabetes mellitus type 2 with the most recent hemoglobin A1c of 5.2 and that was on June 2022. He also has a history of chronic venous insufficiency, peripheral vascular disease, and hypertension. Obviously  with regard to the peripheral vascular disease we will continue to send him to vascular to quantify his ABIs and TBI's. 08/22/2021 upon evaluation today patient appears to be doing well with regard to his wound. This is definitely showing some signs of improvement which is great news and overall very pleased with where things stand today. There does not appear to  be any evidence of active infection locally nor systemically which is great news and overall I feel like that this is showing signs of improvement. It is definitely much less irritated as compared to last week. I am very pleased in that regard I am good to be able to get a lot of this dry skin off today as well which is great news. 09/05/2021 upon evaluation today patient's heel actually showing signs of some improvement though there is still a bit of eschar noted in the center part of the wound. I do believe this is going require some sharp debridement to clear this away. The Iodosorb just is not quite cutting it. Fortunately there does not appear to be any signs of active infection at this time locally nor systemically. 09/12/2021 upon evaluation today patient appears to be doing well with regard to his wound. He has been tolerating the dressing changes I do believe that clearing off the eschar last week has been to help there is still some necrotic tissue removed today overall he seems to be doing quite well however. 09/20/2021 upon evaluation today patient appears to be doing well with regard to his wound he does have some necrotic tissue noted on the surface of the wound but I do feel like we are getting better here definitely making good progress. I do not see any signs of infection which is great news and overall I think that we are on the right track. 09-26-2021 upon evaluation today patient appears to be doing well with regard to his heel ulcer. This is actually measuring smaller and looking better today. Fortunately I do not see any  evidence of active infection locally or systemically which is great news. 10-03-2021 upon evaluation today patient appears to be doing well currently in regard to his heel for the most part although he does have a little bit of what appears to be bruising in the base of the wound. I am not sure exactly where to get the pressure from but this very likely is happening when he is walking he tells me has been wearing some other shoes been a little bit more on his feet than normal. Fortunately I do not see any evidence of active infection at this time. I have contemplated put him in a different type of shoe or even a total contact cast to be honest. With that being said his balance is not good and I therefore have some concerns in that regard. 10-17-2021 upon evaluation today patient appears to be doing poorly in regard to his heel. Again this is showing signs of significant deep tissue injury which is bringing into question whether or not he may actually have something going on here which is more related to his blood flow. We have previously done arterial study though to be honest with ABIs right at 1 and noncompressible which tends to make me think this may be falsely elevated coupled with TBI's which were at 0.5 with biphasic waveforms I am not certain he is getting the blood flow needed to completely heal this wound. I discussed with him today that he may need to see vascular in order to discuss options for her further angiogram/evaluation in general. I would at least leave it up to them as to whether they feel the angiogram is necessary or not. Nonetheless I do believe pressure is also playing a component to this he does tell me he started sleeping with his heel on the bed not raised underneath the pillow  already this was dark and discolored that may have been what triggered this to get worse I am not really sure. 5/8; the wound seems to have less of a deep tissue injury but there is still eschar. Scant  amount of purulent drainage some erythema medially extending up the heel. He has neuropathy no pain no systemic illness. Culture that we did last time showed moderate Proteus, moderate Enterococcus and moderate Pseudomonas. The Enterococcus will require ampicillin so he will require 2 antibiotics. He is using Santyl to the wound 10-31-2021 upon evaluation today patient appears to be doing well with regard to his wound all things considered. There is still necrotic tissue noted in the central portion of the wound which is not what we want to see but unfortunately I think this is just could be the case until we ensure he has good blood flow and can subsequently get things moving in a better direction. Fortunately I do not see any evidence of infection locally or systemically which is great news and overall I think that we are on the right track in that regard. I am going to go ahead and refill the Augmentin as well as the Cipro for him today he was given originally 10 days worth I would have her extend that for 7 days. 11-07-2021 upon evaluation today patient appears to be doing better in regard to the overall appearance of his wound and I do feel like the Annitta Needs is doing a good job here. Fortunately there does not appear to be any evidence of active infection locally nor systemically which is great news and overall I am extremely pleased with where things stand today. The patient is happy as well with the progress. Nonetheless I had extended his Augmentin and Cipro for him at the last visit I think that is going to need to be extended a bit further. That means he is probably can need some probiotics as well. 11-21-2021 upon evaluation today patient appears to be doing a little better in regard to size and is definitely making some progress here. With that being said he also definitely has some areas that are still fairly thick with necrotic tissue. We discussed debridement yet again today and he is Benavidez,  Loys W. ([id number]) amenable to doing some debridement as Raczynski as its not too aggressive which I think we can definitely do. I do believe the Annitta Needs is doing a great job and will definitely be continuing that as well. 12-05-2021 upon evaluation today patient appears to be doing well with regard to his heel. He has been tolerating the dressing changes without complication. Fortunately there does not appear to be any evidence of active infection locally or systemically at this time. No fevers, chills, nausea, vomiting, or diarrhea. 12-19-2021 upon evaluation today patient appears to be doing excellent in regard to his wound. Has been tolerating the Santyl in an excellent fashion. Fortunately I do not see any evidence of infection locally or systemically which is great news and overall I am very pleased with where we stand today. 01-02-2022 upon evaluation today patient appears to be doing excellent in regard to his wound. He has been tolerating the dressing changes without complication. Fortunately I do not see any evidence of active infection there is minimal amount of slough and biofilm buildup which I Minna perform debridement in regard to today but other than this overall I really feel like that the patient is doing excellent and I think he is ready to switch to  a collagen based dressing. I think this is going to do well for him. Electronic Signature(s) Signed: 01/02/2022 9:18:50 AM By: Worthy Keeler PA-C Entered By: Worthy Keeler on 01/02/2022 09:18:50 Cartier, Caleel W. ([id number]) -------------------------------------------------------------------------------- Physical Exam Details Patient Name: Lovett, Kel W. Date of Service: 01/02/2022 8:15 AM Medical Record Number: [id number] Patient Account Number: [id number] Date of Birth/Sex: [birth year] (66 y.o. M) Treating RN: Levora Dredge Primary Care Provider: Alysia Penna Other Clinician: Massie Kluver Referring Provider: Alysia Penna Treating  Provider/Extender: Jeri Cos Weeks in Treatment: 22 Constitutional Well-nourished and well-hydrated in no acute distress. Respiratory normal breathing without difficulty. Psychiatric this patient is able to make decisions and demonstrates good insight into disease process. Alert and Oriented x 3. pleasant and cooperative. Notes Upon inspection patient's wound bed actually showed signs of good granulation and epithelization at this point. Fortunately I do not see any evidence of active infection at this time which is great news. No fevers, chills, nausea, vomiting, or diarrhea. I did perform some sharp debridement of clearway some of the necrotic debris and the patient tolerated that today without complication. Electronic Signature(s) Signed: 01/02/2022 9:19:34 AM By: Worthy Keeler PA-C Entered By: Worthy Keeler on 01/02/2022 09:19:34 Rosinski, Willow W. ([id number]) -------------------------------------------------------------------------------- Physician Orders Details Patient Name: Caniglia, Roberts W. Date of Service: 01/02/2022 8:15 AM Medical Record Number: [id number] Patient Account Number: [id number] Date of Birth/Sex: 07-Apr-1956 (66 y.o. 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 o Nurse Visit as needed [employer] o Wash wounds with antibacterial soap and  water. o May shower; gently cleanse wound with antibacterial soap, rinse and pat dry prior to dressing wounds - shower with dressing on and remove after shower o No tub bath. Edema Control - Lymphedema / Segmental Compressive Device / Other o Elevate, Exercise Daily and Avoid Standing for Beckmann Periods of Time. o Elevate leg(s) parallel to the floor when sitting. o DO YOUR BEST to sleep in the bed at night. DO NOT sleep in your recliner. Infinger hours of sitting in a recliner leads to swelling of the legs and/or potential wounds on your backside. Wound Treatment Wound #1 - Calcaneus Wound Laterality: Left Cleanser: Normal Saline 3 x Per Week/30 Days Discharge Instructions: Wash your hands with soap and water. Remove old dressing, discard into plastic bag and place into trash. Cleanse the wound with Normal Saline prior to applying a clean dressing using gauze sponges, not tissues or cotton balls. Do not scrub or use excessive force. Pat dry using gauze sponges, not tissue or cotton balls. Primary Dressing: Prisma 4.34 (in) (DME) (Generic) 3 x Per Week/30 Days Discharge Instructions: Moisten w/normal saline or sterile water; Cover wound as directed. Do not remove from wound bed. Secondary Dressing: ABD Pad 5x9 (in/in) 3 x Per Week/30 Days Discharge Instructions: Cover with ABD pad Secondary Dressing: Kerlix 4.5 x 4.1 (in/yd) 3 x Per Week/30 Days Discharge Instructions: Apply Kerlix 4.5 x 4.1 (in/yd) as instructed Secured With: Medipore Tape - 55M Medipore H Soft Cloth Surgical  Tape, 2x2 (in/yd) 3 x Per Week/30 Days Electronic Signature(s) Signed: 01/03/2022 5:53:34 PM By: Lenda Kelp PA-C Signed: 01/04/2022 8:34:35 AM By: Betha Loa Entered By: Betha Loa on 01/02/2022 09:01:13 Gallo, Younes W. ([id number]) -------------------------------------------------------------------------------- Problem List Details Patient Name: Detienne, Adekunle W. Date of Service: 01/02/2022 8:15  AM Medical Record Number: [id number] Patient Account Number: [id number] Date of Birth/Sex: June 30, 1955 (66 y.o. M) Treating RN: Angelina Pih Primary Care Provider: [name] Other Clinician: Betha Loa Referring Provider: [name] Treating Provider/Extender: Allen Derry Weeks in Treatment: 21 Active Problems ICD-10 Encounter Code Description Active Date MDM Diagnosis E11.621 Type 2 diabetes mellitus with foot ulcer 08/08/2021 No Yes L97.422 Non-pressure chronic ulcer of left heel and midfoot with fat layer 08/08/2021 No Yes exposed I50.277 Other chronic osteomyelitis, left ankle and foot 11/07/2021 No Yes I87.2 Venous insufficiency (chronic) (peripheral) 08/08/2021 No Yes I73.89 Other specified peripheral vascular diseases 08/08/2021 No Yes I10 Essential (primary) hypertension 08/08/2021 No Yes Inactive Problems Resolved Problems Electronic Signature(s) Signed: 01/03/2022 5:53:34 PM By: Lenda Kelp PA-C Entered By: Lenda Kelp on 01/02/2022 08:22:37 Bringhurst, Sharif W. ([id number]) -------------------------------------------------------------------------------- Progress Note Details Patient Name: Cowman, Cam W. Date of Service: 01/02/2022 8:15 AM Medical Record Number: [id number] Patient Account Number: [id number] Date of Birth/Sex: May 07, 1956 (66 y.o. M) Treating RN: Angelina Pih Primary Care Provider: [name] Other Clinician: Betha Loa Referring Provider: [name] Treating Provider/Extender: Rowan Blase in Treatment: 21 Subjective Chief Complaint Information obtained from Patient Left heel ulcer History of Present Illness (HPI) 08/08/2021 upon evaluation today patient appears to be doing somewhat poorly in regard to a heel ulcer that began as a result of the patient tells me him trying to get his foot into his shoes that were somewhat tight. This was around 4 January. He was using a shoehorn fairly aggressively and thinks that he may have caused  the bruising at that time. Fortunately there does not appear to be any signs of infection at this point. Nonetheless he does have some definite necrotic tissue. This can need to be cleared away. Nonetheless unfortunately [name] we do not have clearance in my opinion therefore to go forward with an aggressive sharp debridement. Patient does have diabetes mellitus type 2 with the most recent hemoglobin A1c of 5.2 and that was on June 2022. He also has a history of chronic venous insufficiency, peripheral vascular disease, and hypertension. Obviously with regard to the peripheral vascular disease we will continue to send him to vascular to quantify his ABIs and TBI's. 08/22/2021 upon evaluation today patient appears to be doing well with regard to his wound. This is definitely showing some signs of improvement which is great news and overall very pleased with where things stand today. There does not appear to be any evidence of active infection locally nor systemically which is great news and overall I feel like that this is showing signs of improvement. It is definitely much less irritated as compared to last week. I am very pleased in that regard I am good to be able to get a lot of this dry skin off today as well which is great news. 09/05/2021 upon evaluation today patient's heel actually showing signs of some improvement though there is still a bit of eschar noted in the center part of the wound. I do believe this is going require some sharp debridement to clear this away. The Iodosorb just is not quite cutting it. Fortunately there does not appear to be any  signs of active infection at this time locally nor systemically. 09/12/2021 upon evaluation today patient appears to be doing well with regard to his wound. He has been tolerating the dressing changes I do believe that clearing off the eschar last week has been to help there is still some necrotic tissue removed today overall  he seems to be doing quite well however. 09/20/2021 upon evaluation today patient appears to be doing well with regard to his wound he does have some necrotic tissue noted on the surface of the wound but I do feel like we are getting better here definitely making good progress. I do not see any signs of infection which is great news and overall I think that we are on the right track. 09-26-2021 upon evaluation today patient appears to be doing well with regard to his heel ulcer. This is actually measuring smaller and looking better today. Fortunately I do not see any evidence of active infection locally or systemically which is great news. 10-03-2021 upon evaluation today patient appears to be doing well currently in regard to his heel for the most part although he does have a little bit of what appears to be bruising in the base of the wound. I am not sure exactly where to get the pressure from but this very likely is happening when he is walking he tells me has been wearing some other shoes been a little bit more on his feet than normal. Fortunately I do not see any evidence of active infection at this time. I have contemplated put him in a different type of shoe or even a total contact cast to be honest. With that being said his balance is not good and I therefore have some concerns in that regard. 10-17-2021 upon evaluation today patient appears to be doing poorly in regard to his heel. Again this is showing signs of significant deep tissue injury which is bringing into question whether or not he may actually have something going on here which is more related to his blood flow. We have previously done arterial study though to be honest with ABIs right at 1 and noncompressible which tends to make me think this may be falsely elevated coupled with TBI's which were at 0.5 with biphasic waveforms I am not certain he is getting the blood flow needed to completely heal this wound. I discussed with him today  that he may need to see vascular in order to discuss options for her further angiogram/evaluation in general. I would at least leave it up to them as to whether they feel the angiogram is necessary or not. Nonetheless I do believe pressure is also playing a component to this he does tell me he started sleeping with his heel on the bed not raised underneath the pillow already this was dark and discolored that may have been what triggered this to get worse I am not really sure. 5/8; the wound seems to have less of a deep tissue injury but there is still eschar. Scant amount of purulent drainage some erythema medially extending up the heel. He has neuropathy no pain no systemic illness. Culture that we did last time showed moderate Proteus, moderate Enterococcus and moderate Pseudomonas. The Enterococcus will require ampicillin so he will require 2 antibiotics. He is using Santyl to the wound 10-31-2021 upon evaluation today patient appears to be doing well with regard to his wound all things considered. There is still necrotic tissue noted in the central portion of the wound which  is not what we want to see but unfortunately I think this is just could be the case until we ensure he has good blood flow and can subsequently get things moving in a better direction. Fortunately I do not see any evidence of infection locally or systemically which is great news and overall I think that we are on the right track in that regard. I am going to go ahead and refill the Augmentin as well as the Cipro for him today he was given originally 10 days worth I would have her extend that for 7 days. 11-07-2021 upon evaluation today patient appears to be doing better in regard to the overall appearance of his wound and I do feel like the Annitta Needs is doing a good job here. Fortunately there does not appear to be any evidence of active infection locally nor systemically which is great news and overall I am extremely pleased with  where things stand today. The patient is happy as well with the progress. Nonetheless I had extended his Scalese, Senon W. ([id number]) Augmentin and Cipro for him at the last visit I think that is going to need to be extended a bit further. That means he is probably can need some probiotics as well. 11-21-2021 upon evaluation today patient appears to be doing a little better in regard to size and is definitely making some progress here. With that being said he also definitely has some areas that are still fairly thick with necrotic tissue. We discussed debridement yet again today and he is amenable to doing some debridement as [place] as its not too aggressive which I think we can definitely do. I do believe the Annitta Needs is doing a great job and will definitely be continuing that as well. 12-05-2021 upon evaluation today patient appears to be doing well with regard to his heel. He has been tolerating the dressing changes without complication. Fortunately there does not appear to be any evidence of active infection locally or systemically at this time. No fevers, chills, nausea, vomiting, or diarrhea. 12-19-2021 upon evaluation today patient appears to be doing excellent in regard to his wound. Has been tolerating the Santyl in an excellent fashion. Fortunately I do not see any evidence of infection locally or systemically which is great news and overall I am very pleased with where we stand today. 01-02-2022 upon evaluation today patient appears to be doing excellent in regard to his wound. He has been tolerating the dressing changes without complication. Fortunately I do not see any evidence of active infection there is minimal amount of slough and biofilm buildup which I Minna perform debridement in regard to today but other than this overall I really feel like that the patient is doing excellent and I think he is ready to switch to a collagen based dressing. I think this is going to do well for  him. Objective Constitutional Well-nourished and well-hydrated in no acute distress. Vitals Time Taken: 8:15 AM, Height: 68 in, Weight: 220 lbs, BMI: 33.4, Temperature: 98.0 F, Pulse: 83 bpm, Respiratory Rate: 18 breaths/min, Blood Pressure: 170/88 mmHg. Respiratory normal breathing without difficulty. Psychiatric this patient is able to make decisions and demonstrates good insight into disease process. Alert and Oriented x 3. pleasant and cooperative. General Notes: Upon inspection patient's wound bed actually showed signs of good granulation and epithelization at this point. Fortunately I do not see any evidence of active infection at this time which is great news. No fevers, chills, nausea, vomiting, or diarrhea. I did  perform some sharp debridement of clearway some of the necrotic debris and the patient tolerated that today without complication. Integumentary (Hair, Skin) Wound #1 status is Open. Original cause of wound was Pressure Injury. The date acquired was: 06/19/2021. The wound has been in treatment 21 weeks. The wound is located on the Left Calcaneus. The wound measures 2.5cm length x 2.5cm width x 0.2cm depth; 4.909cm^2 area and 0.982cm^3 volume. There is Fat Layer (Subcutaneous Tissue) exposed. There is a medium amount of serosanguineous drainage noted. There is small (1-33%) red, hyper - granulation within the wound bed. 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 ([id number]) Procedures Wound #1 Pre-procedure diagnosis of Wound #1 is a Diabetic Wound/Ulcer of the Lower Extremity located on the Left Calcaneus .Severity of Tissue Pre Debridement is: Fat layer exposed. There was a  Excisional Skin/Subcutaneous Tissue Debridement with a total area of 9 sq cm performed by Nelida Meuse., PA-C. With the following instrument(s): Curette to remove Viable and Non-Viable tissue/material. Material removed includes Callus, Subcutaneous Tissue, Slough, and Biofilm. A time out was conducted at 08:46, prior to the start of the procedure. A Minimum amount of bleeding was controlled with Pressure. The procedure was tolerated well. Post Debridement Measurements: 2.5cm length x 2.5cm width x 0.2cm depth; 0.982cm^3 volume. Character of Wound/Ulcer Post Debridement is stable. Severity of Tissue Post Debridement is: Fat layer exposed. Post procedure Diagnosis Wound #1: Same as Pre-Procedure Plan Follow-up Appointments: Return Appointment in 1 week. Nurse Visit as needed Bathing/ Shower/ Hygiene: Wash wounds with antibacterial soap and water. May shower; gently cleanse wound with antibacterial soap, rinse and pat dry prior to dressing wounds - shower with dressing on and remove after shower No tub bath. Edema Control - Lymphedema / Segmental Compressive Device / Other: Elevate, Exercise Daily and Avoid Standing for Broughton Periods of Time. Elevate leg(s) parallel to the floor when sitting. DO YOUR BEST to sleep in the bed at night. DO NOT sleep in your recliner. Yackel hours of sitting in a recliner leads to swelling of the legs and/or potential wounds on your backside. WOUND #1: - Calcaneus Wound Laterality: Left Cleanser: Normal Saline 3 x Per Week/30 Days Discharge Instructions: Wash your hands with soap and water. Remove old dressing, discard into plastic bag and place into trash. Cleanse the wound with Normal Saline prior to applying a clean dressing using gauze sponges, not tissues or cotton balls. Do not scrub or use excessive force. Pat dry using gauze sponges, not tissue or cotton balls. Primary Dressing: Prisma 4.34 (in) (DME) (Generic) 3 x Per Week/30 Days Discharge Instructions:  Moisten w/normal saline or sterile water; Cover wound as directed. Do not remove from wound bed. Secondary Dressing: ABD Pad 5x9 (in/in) 3 x Per Week/30 Days Discharge Instructions: Cover with ABD pad Secondary Dressing: Kerlix 4.5 x 4.1 (in/yd) 3 x Per Week/30 Days Discharge Instructions: Apply Kerlix 4.5 x 4.1 (in/yd) as instructed Secured With: Medipore Tape - 78M Medipore H Soft Cloth Surgical Tape, 2x2 (in/yd) 3 x Per Week/30 Days 1. I am good recommend that we go ahead and continue with the wound care measures as before and the patient is in agreement with the plan. This includes the use of the silver collagen which would be the one change that we  make. Overall I think this is good to be a good change he no longer requires the Santyl and this should help to hopefully get this wound to close faster. 2. I am also can recommend that he continue with ABD pad followed by roll gauze to secure in place which is doing quite well. We will see patient back for reevaluation in 2 weeks here in the clinic. If anything worsens or changes patient will contact our office for additional recommendations. Electronic Signature(s) Signed: 01/02/2022 9:20:05 AM By: Lenda Kelp PA-C Entered By: Lenda Kelp on 01/02/2022 09:20:05 Yearsley, Euel W. ([id number]) -------------------------------------------------------------------------------- SuperBill Details Patient Name: Frayre, Jayceion W. Date of Service: 01/02/2022 Medical Record Number: [id number] Patient Account Number: [id number] Date of Birth/Sex: 1955-06-24 (66 y.o. M) Treating RN: Angelina Pih Primary Care Provider: [name] Other Clinician: Betha Loa Referring Provider: [name] Treating Provider/Extender: Rowan Blase in Treatment: 21 Diagnosis Coding ICD-10 Codes Code Description E11.621 Type 2 diabetes mellitus with foot ulcer L97.422 Non-pressure chronic ulcer of left heel and midfoot with fat layer exposed M86.672 Other  chronic osteomyelitis, left ankle and foot I87.2 Venous insufficiency (chronic) (peripheral) I73.89 Other specified peripheral vascular diseases I10 Essential (primary) hypertension Facility Procedures CPT4 Code: [id number] Description: 11042 - DEB SUBQ TISSUE 20 SQ CM/< Modifier: Quantity: 1 CPT4 Code: Description: ICD-10 Diagnosis Description L97.422 Non-pressure chronic ulcer of left heel and midfoot with fat layer exp Modifier: osed Quantity: Physician Procedures CPT4 Code: 3903009 Description: 11042 - WC PHYS SUBQ TISS 20 SQ CM Modifier: Quantity: 1 CPT4 Code: Description: ICD-10 Diagnosis Description L97.422 Non-pressure chronic ulcer of left heel and midfoot with fat layer exp Modifier: osed Quantity: Electronic Signature(s) Signed: 01/02/2022 9:20:13 AM By: Lenda Kelp PA-C Entered By: Lenda Kelp on 01/02/2022 09:20:12

## 2022-01-16 ENCOUNTER — Ambulatory Visit: Payer: No Typology Code available for payment source | Admitting: Physician Assistant

## 2022-01-19 ENCOUNTER — Encounter: Payer: No Typology Code available for payment source | Attending: Physician Assistant | Admitting: Physician Assistant

## 2022-01-19 DIAGNOSIS — L97422 Non-pressure chronic ulcer of left heel and midfoot with fat layer exposed: Secondary | ICD-10-CM | POA: Insufficient documentation

## 2022-01-19 DIAGNOSIS — E1140 Type 2 diabetes mellitus with diabetic neuropathy, unspecified: Secondary | ICD-10-CM | POA: Diagnosis not present

## 2022-01-19 DIAGNOSIS — E1151 Type 2 diabetes mellitus with diabetic peripheral angiopathy without gangrene: Secondary | ICD-10-CM | POA: Diagnosis not present

## 2022-01-19 DIAGNOSIS — M86672 Other chronic osteomyelitis, left ankle and foot: Secondary | ICD-10-CM | POA: Insufficient documentation

## 2022-01-19 DIAGNOSIS — I10 Essential (primary) hypertension: Secondary | ICD-10-CM | POA: Insufficient documentation

## 2022-01-19 DIAGNOSIS — E11621 Type 2 diabetes mellitus with foot ulcer: Secondary | ICD-10-CM | POA: Diagnosis present

## 2022-01-19 DIAGNOSIS — E1169 Type 2 diabetes mellitus with other specified complication: Secondary | ICD-10-CM | POA: Diagnosis not present

## 2022-01-19 DIAGNOSIS — I872 Venous insufficiency (chronic) (peripheral): Secondary | ICD-10-CM | POA: Insufficient documentation

## 2022-01-19 NOTE — Progress Notes (Addendum)
Hyslop, Shone Lacretia Nicks ([id number]) Visit Report for 01/19/2022 Chief Complaint Document Details Patient Name: [name], [name] Date of Service: 01/19/2022 11:15 AM Medical Record Number: [id number] Patient Account Number: [id number] Date of Birth/Sex: May 01, 1956 (66 y.o. M) Treating [name]: [name] Primary Care Provider: Gershon [name] Other Clinician: Betha [name] Referring Provider: Gershon [name] Treating Provider/Extender: [name] in Treatment: 23 Information Obtained from: Patient Chief Complaint Left heel ulcer Electronic Signature(s) Signed: 01/19/2022 11:52:54 AM By: Gerald Kelp [name] Entered By: [name] on 01/19/2022 11:52:53 [name], [name] ([id number]) -------------------------------------------------------------------------------- Debridement Details Patient Name: [name], [name] Date of Service: 01/19/2022 11:15 AM Medical Record Number: [id number] Patient Account Number: [id number] Date of Birth/Sex: 30-Dec-1955 (66 y.o. M) Treating [name]: [name] Primary Care Provider: Gershon [name] Other Clinician: Betha [name] Referring Provider: Gershon [name] Treating Provider/Extender: [name] in Treatment: 23 Debridement Performed for Wound #1 Left Calcaneus Assessment: Performed By: Physician [name]., [name] Debridement Type: Debridement Severity of Tissue Pre Debridement: Fat layer exposed Level of Consciousness (Pre- Awake and Alert procedure): Pre-procedure Verification/Time Out Yes - 12:01 Taken: Start Time: 12:01 Total Area Debrided (L x W): 2.4 (cm) x 2.8 (cm) = 6.72 (cm) Tissue and other material Viable, Non-Viable, Slough, Subcutaneous, Slough debrided: Level: Skin/Subcutaneous Tissue Debridement Description: Excisional Instrument: Curette Bleeding: Minimum Hemostasis Achieved: Pressure End Time: 12:04 Response to Treatment: Procedure was tolerated well Level of Consciousness (Post- Awake and Alert procedure): Post Debridement Measurements  of Total Wound Length: (cm) 2.4 Width: (cm) 2.8 Depth: (cm) 0.3 Volume: (cm) 1.583 Character of Wound/Ulcer Post Debridement: Stable Severity of Tissue Post Debridement: Fat layer exposed Post Procedure Diagnosis Same as Pre-procedure Electronic Signature(s) Signed: 01/19/2022 6:41:29 PM By: Gerald Kelp [name] Signed: 01/20/2022 3:39:08 PM By: [name] BSN, [name], [name], [name], [name] Signed: 01/23/2022 4:15:07 PM By: [name] Entered By: [name] on 01/19/2022 12:05:55 [name], [name] ([id number]) -------------------------------------------------------------------------------- HPI Details Patient Name: [name], [name] Date of Service: 01/19/2022 11:15 AM Medical Record Number: [id number] Patient Account Number: [id number] Date of Birth/Sex: 13-Aug-1955 (66 y.o. M) Treating [name]: [name] Primary Care Provider: Gershon [name] Other Clinician: Betha [name] Referring Provider: Gershon [name] Treating Provider/Extender: [name] in Treatment: 23 History of Present Illness HPI Description: 08/08/2021 upon evaluation today patient appears to be doing somewhat poorly in regard to a heel ulcer that began as a result of the patient tells me him trying to get his foot into his shoes that were somewhat tight. This was around 4 January. He was using a shoehorn fairly aggressively and thinks that he may have caused the bruising at that time. Fortunately there does not appear to be any signs of infection at this point. Nonetheless he does have some definite necrotic tissue. This can need to be cleared away. Nonetheless unfortunately [name] we do not have clearance in my opinion therefore to go forward with an aggressive sharp debridement. Patient does have diabetes mellitus type 2 with the most recent hemoglobin A1c of 5.2 and that was on June 2022. He also has a history of chronic venous insufficiency, peripheral vascular disease, and hypertension. Obviously with regard to  the peripheral vascular disease we will continue to send him to vascular to quantify his ABIs and TBI's. 08/22/2021 upon evaluation today patient appears to be doing well with regard to his wound. This is definitely showing some signs of improvement which is great news and overall very pleased with where things stand today. There does  not appear to be any evidence of active infection locally nor systemically which is great news and overall I feel like that this is showing signs of improvement. It is definitely much less irritated as compared to last week. I am very pleased in that regard I am good to be able to get a lot of this dry skin off today as well which is great news. 09/05/2021 upon evaluation today patient's heel actually showing signs of some improvement though there is still a bit of eschar noted in the center part of the wound. I do believe this is going require some sharp debridement to clear this away. The Iodosorb just is not quite cutting it. Fortunately there does not appear to be any signs of active infection at this time locally nor systemically. 09/12/2021 upon evaluation today patient appears to be doing well with regard to his wound. He has been tolerating the dressing changes I do believe that clearing off the eschar last week has been to help there is still some necrotic tissue removed today overall he seems to be doing quite well however. 09/20/2021 upon evaluation today patient appears to be doing well with regard to his wound he does have some necrotic tissue noted on the surface of the wound but I do feel like we are getting better here definitely making good progress. I do not see any signs of infection which is great news and overall I think that we are on the right track. 09-26-2021 upon evaluation today patient appears to be doing well with regard to his heel ulcer. This is actually measuring smaller and looking better today. Fortunately I do not see any evidence of active  infection locally or systemically which is great news. 10-03-2021 upon evaluation today patient appears to be doing well currently in regard to his heel for the most part although he does have a little bit of what appears to be bruising in the base of the wound. I am not sure exactly where to get the pressure from but this very likely is happening when he is walking he tells me has been wearing some other shoes been a little bit more on his feet than normal. Fortunately I do not see any evidence of active infection at this time. I have contemplated put him in a different type of shoe or even a total contact cast to be honest. With that being said his balance is not good and I therefore have some concerns in that regard. 10-17-2021 upon evaluation today patient appears to be doing poorly in regard to his heel. Again this is showing signs of significant deep tissue injury which is bringing into question whether or not he may actually have something going on here which is more related to his blood flow. We have previously done arterial study though to be honest with ABIs right at 1 and noncompressible which tends to make me think this may be falsely elevated coupled with TBI's which were at 0.5 with biphasic waveforms I am not certain he is getting the blood flow needed to completely heal this wound. I discussed with him today that he may need to see vascular in order to discuss options for her further angiogram/evaluation in general. I would at least leave it up to them as to whether they feel the angiogram is necessary or not. Nonetheless I do believe pressure is also playing a component to this he does tell me he started sleeping with his heel on the bed not raised  underneath the pillow already this was dark and discolored that may have been what triggered this to get worse I am not really sure. 5/8; the wound seems to have less of a deep tissue injury but there is still eschar. Scant amount of purulent  drainage some erythema medially extending up the heel. He has neuropathy no pain no systemic illness. Culture that we did last time showed moderate Proteus, moderate Enterococcus and moderate Pseudomonas. The Enterococcus will require ampicillin so he will require 2 antibiotics. He is using Santyl to the wound 10-31-2021 upon evaluation today patient appears to be doing well with regard to his wound all things considered. There is still necrotic tissue noted in the central portion of the wound which is not what we want to see but unfortunately I think this is just could be the case until we ensure he has good blood flow and can subsequently get things moving in a better direction. Fortunately I do not see any evidence of infection locally or systemically which is great news and overall I think that we are on the right track in that regard. I am going to go ahead and refill the Augmentin as well as the Cipro for him today he was given originally 10 days worth I would have her extend that for 7 days. 11-07-2021 upon evaluation today patient appears to be doing better in regard to the overall appearance of his wound and I do feel like the Melburn Popper is doing a good job here. Fortunately there does not appear to be any evidence of active infection locally nor systemically which is great news and overall I am extremely pleased with where things stand today. The patient is happy as well with the progress. Nonetheless I had extended his Augmentin and Cipro for him at the last visit I think that is going to need to be extended a bit further. That means he is probably can need some probiotics as well. 11-21-2021 upon evaluation today patient appears to be doing a little better in regard to size and is definitely making some progress here. With that being said he also definitely has some areas that are still fairly thick with necrotic tissue. We discussed debridement yet again today and he is Perman, Darryl W.  ([id number]) amenable to doing some debridement as Voris as its not too aggressive which I think we can definitely do. I do believe the Melburn Popper is doing a great job and will definitely be continuing that as well. 12-05-2021 upon evaluation today patient appears to be doing well with regard to his heel. He has been tolerating the dressing changes without complication. Fortunately there does not appear to be any evidence of active infection locally or systemically at this time. No fevers, chills, nausea, vomiting, or diarrhea. 12-19-2021 upon evaluation today patient appears to be doing excellent in regard to his wound. Has been tolerating the Santyl in an excellent fashion. Fortunately I do not see any evidence of infection locally or systemically which is great news and overall I am very pleased with where we stand today. 01-02-2022 upon evaluation today patient appears to be doing excellent in regard to his wound. He has been tolerating the dressing changes without complication. Fortunately I do not see any evidence of active infection there is minimal amount of slough and biofilm buildup which I Minna perform debridement in regard to today but other than this overall I really feel like that the patient is doing excellent and I think he is ready  to switch to a collagen based dressing. I think this is going to do well for him. 01-19-2022 upon evaluation today patient appears to be doing much better in regard to his wound. He has been tolerating the dressing changes without complication. Fortunately there does not appear to be any evidence of active infection locally or systemically at this time which is great news and overall I am extremely pleased with where we stand. There does not appear to be any evidence of active infection whatsoever which is great news. No fevers, chills, nausea, vomiting, or diarrhea. Electronic Signature(s) Signed: 01/19/2022 5:19:30 PM By: Gerald Kelp [name] Entered By: [name] on 01/19/2022 17:19:30 [name], [name] ([id number]) -------------------------------------------------------------------------------- Physical Exam Details Patient Name: Leiber, Kadarious W. Date of Service: 01/19/2022 11:15 AM Medical Record Number: [id number] Patient Account Number: [id number] Date of Birth/Sex: 11-01-1955 (65 y.o. M) Treating [name]: [name] Primary Care Provider: Gershon [name] Other Clinician: Betha [name] Referring Provider: Gershon [name] Treating Provider/Extender: Allen Derry Weeks in Treatment: 23 Constitutional Well-nourished and well-hydrated in no acute distress. Respiratory normal breathing without difficulty. Psychiatric this patient is able to make decisions and demonstrates good insight into disease process. Alert and Oriented x 3. pleasant and cooperative. Notes Upon inspection patient's wound bed did require sharp debridement of clearway some of the necrotic debris he tolerated this today without complication postdebridement the wound bed appears to be doing much better which is great news and I am very pleased in that regard. Electronic Signature(s) Signed: 01/19/2022 5:19:45 PM By: Gerald Kelp [name] Entered By: [name] on 01/19/2022 17:19:45 [name], [name] ([id number]) -------------------------------------------------------------------------------- Physician Orders Details Patient Name: [name], [name] Date of Service: 01/19/2022 11:15 AM Medical Record Number: [id number] Patient Account Number: [id number] Date of Birth/Sex: Nov 07, 1955 (66 y.o. 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 o Nurse Visit as needed [facility] o Wash wounds with antibacterial soap and water. o May shower; gently cleanse wound with antibacterial soap, rinse and pat dry prior to dressing wounds - shower with dressing on and remove after shower o No tub bath. Edema Control - Lymphedema / Segmental Compressive Device / Other o Elevate, Exercise Daily and Avoid Standing for Mongeau Periods of Time. o Elevate leg(s) parallel to the floor when sitting. o DO YOUR BEST to sleep in the bed at night. DO NOT sleep in your recliner. Lenger hours of sitting in a recliner leads to swelling of the legs and/or potential wounds on your backside. Wound Treatment Wound #1 - Calcaneus Wound Laterality: Left Cleanser: Normal Saline 3 x Per Week/30 Days Discharge Instructions: Wash your hands with soap and water. Remove old dressing, discard into plastic bag and place into trash. Cleanse the wound with Normal Saline prior to applying a clean dressing using gauze sponges, not tissues or cotton balls. Do not scrub or use excessive force. Pat dry using gauze sponges, not tissue or cotton balls. Primary Dressing: Prisma 4.34 (in) (DME) (Dispense As Written) 3 x Per Week/30 Days  Discharge Instructions: Moisten w/normal saline or sterile water; Cover wound as directed. Do not remove from wound bed. Secondary Dressing: (SILCONE BORDER) Zetuvit Plus SILICONE BORDER Dressing 5x5 (in/in) (DME) (Generic) 3 x Per Week/30 Days Discharge Instructions: Please do not put silicone bordered dressings under wraps. Use non-bordered dressing only. Electronic Signature(s) Signed: 01/20/2022 3:39:08 PM By: [name], BSN, [name], [name], [name], [name] Signed: 01/20/2022  3:54:34 PM By: Gerald Kelp [name] Previous Signature: 01/19/2022 6:41:29 PM Version By: Gerald Kelp [name] Entered By: [name] BSN, [name], [name], Gerald on 01/20/2022 14:39:07 [name], [name] ([id number]) -------------------------------------------------------------------------------- Problem List Details Patient Name: [name], [name] Date of Service: 01/19/2022 11:15 AM Medical Record Number: [id number] Patient Account Number: [id number] Date of Birth/Sex: Jun 13, 1956 (66 y.o. 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 ([id number]) -------------------------------------------------------------------------------- Progress Note Details Patient Name: [name], [name] Date of Service: 01/19/2022 11:15 AM Medical Record Number: [id number] Patient Account Number: [id number] Date of Birth/Sex: [birth year] (66 y.o. M) Treating [name]: [name] Primary Care Provider: Gershon [name] Other Clinician: Betha [name] Referring Provider: Gershon [name] Treating Provider/Extender: [name] in Treatment: 23 Subjective Chief Complaint Information obtained from Patient Left heel  ulcer History of Present Illness (HPI) 08/08/2021 upon evaluation today patient appears to be doing somewhat poorly in regard to a heel ulcer that began as a result of the patient tells me him trying to get his foot into his shoes that were somewhat tight. This was around 4 January. He was using a shoehorn fairly aggressively and thinks that he may have caused the bruising at that time. Fortunately there does not appear to be any signs of infection at this point. Nonetheless he does have some definite necrotic tissue. This can need to be cleared away. Nonetheless unfortunately [name] we do not have clearance in my opinion therefore to go forward with an aggressive sharp debridement. Patient does have diabetes mellitus type 2 with the most recent hemoglobin A1c of 5.2 and that was on June 2022. He also has a history of chronic venous insufficiency, peripheral vascular disease, and hypertension. Obviously with regard to the peripheral vascular disease we will continue to send him to vascular to quantify his ABIs and TBI's. 08/22/2021 upon evaluation today patient appears to be doing well with regard to his wound. This is definitely showing some signs of improvement which is great news and overall very pleased with where things stand today. There does not appear to be any evidence of active infection locally nor systemically which is great news and overall I feel like that this is showing signs of improvement. It is definitely much less irritated as compared to last week. I am very pleased in that regard I am good to be able to get a lot of this dry skin off today as well  which is great news. 09/05/2021 upon evaluation today patient's heel actually showing signs of some improvement though there is still a bit of eschar noted in the center part of the wound. I do believe this is going require some sharp debridement to clear this away. The Iodosorb just is not quite cutting  it. Fortunately there does not appear to be any signs of active infection at this time locally nor systemically. 09/12/2021 upon evaluation today patient appears to be doing well with regard to his wound. He has been tolerating the dressing changes I do believe that clearing off the eschar last week has been to help there is still some necrotic tissue removed today overall he seems to be doing quite well however. 09/20/2021 upon evaluation today patient appears to be doing well with regard to his wound he does have some necrotic tissue noted on the surface of the wound but I do feel like we are getting better here definitely making good progress. I do not see any signs of infection which is great news and overall I think that we are on the right track. 09-26-2021 upon evaluation today patient appears to be doing well with regard to his heel ulcer. This is actually measuring smaller and looking better today. Fortunately I do not see any evidence of active infection locally or systemically which is great news. 10-03-2021 upon evaluation today patient appears to be doing well currently in regard to his heel for the most part although he does have a little bit of what appears to be bruising in the base of the wound. I am not sure exactly where to get the pressure from but this very likely is happening when he is walking he tells me has been wearing some other shoes been a little bit more on his feet than normal. Fortunately I do not see any evidence of active infection at this time. I have contemplated put him in a different type of shoe or even a total contact cast to be honest. With that being said his balance is not good and I therefore have some concerns in that regard. 10-17-2021 upon evaluation today patient appears to be doing poorly in regard to his heel. Again this is showing signs of significant deep tissue injury which is bringing into question whether or not he may actually have something going on  here which is more related to his blood flow. We have previously done arterial study though to be honest with ABIs right at 1 and noncompressible which tends to make me think this may be falsely elevated coupled with TBI's which were at 0.5 with biphasic waveforms I am not certain he is getting the blood flow needed to completely heal this wound. I discussed with him today that he may need to see vascular in order to discuss options for her further angiogram/evaluation in general. I would at least leave it up to them as to whether they feel the angiogram is necessary or not. Nonetheless I do believe pressure is also playing a component to this he does tell me he started sleeping with his heel on the bed not raised underneath the pillow already this was dark and discolored that may have been what triggered this to get worse I am not really sure. 5/8; the wound seems to have less of a deep tissue injury but there is still eschar. Scant amount of purulent drainage some erythema medially extending up the heel. He has neuropathy no pain no systemic illness. Culture that  we did last time showed moderate Proteus, moderate Enterococcus and moderate Pseudomonas. The Enterococcus will require ampicillin so he will require 2 antibiotics. He is using Santyl to the wound 10-31-2021 upon evaluation today patient appears to be doing well with regard to his wound all things considered. There is still necrotic tissue noted in the central portion of the wound which is not what we want to see but unfortunately I think this is just could be the case until we ensure he has good blood flow and can subsequently get things moving in a better direction. Fortunately I do not see any evidence of infection locally or systemically which is great news and overall I think that we are on the right track in that regard. I am going to go ahead and refill the Augmentin as well as the Cipro for him today he was given originally 10 days  worth I would have her extend that for 7 days. 11-07-2021 upon evaluation today patient appears to be doing better in regard to the overall appearance of his wound and I do feel like the Melburn Popper is doing a good job here. Fortunately there does not appear to be any evidence of active infection locally nor systemically which is great news and overall I am extremely pleased with where things stand today. The patient is happy as well with the progress. Nonetheless I had extended his Gasparini, Rjay W. ([id number]) Augmentin and Cipro for him at the last visit I think that is going to need to be extended a bit further. That means he is probably can need some probiotics as well. 11-21-2021 upon evaluation today patient appears to be doing a little better in regard to size and is definitely making some progress here. With that being said he also definitely has some areas that are still fairly thick with necrotic tissue. We discussed debridement yet again today and he is amenable to doing some debridement as Paolillo as its not too aggressive which I think we can definitely do. I do believe the Melburn Popper is doing a great job and will definitely be continuing that as well. 12-05-2021 upon evaluation today patient appears to be doing well with regard to his heel. He has been tolerating the dressing changes without complication. Fortunately there does not appear to be any evidence of active infection locally or systemically at this time. No fevers, chills, nausea, vomiting, or diarrhea. 12-19-2021 upon evaluation today patient appears to be doing excellent in regard to his wound. Has been tolerating the Santyl in an excellent fashion. Fortunately I do not see any evidence of infection locally or systemically which is great news and overall I am very pleased with where we stand today. 01-02-2022 upon evaluation today patient appears to be doing excellent in regard to his wound. He has been tolerating the dressing changes without  complication. Fortunately I do not see any evidence of active infection there is minimal amount of slough and biofilm buildup which I Minna perform debridement in regard to today but other than this overall I really feel like that the patient is doing excellent and I think he is ready to switch to a collagen based dressing. I think this is going to do well for him. 01-19-2022 upon evaluation today patient appears to be doing much better in regard to his wound. He has been tolerating the dressing changes without complication. Fortunately there does not appear to be any evidence of active infection locally or systemically at this time which is great  news and overall I am extremely pleased with where we stand. There does not appear to be any evidence of active infection whatsoever which is great news. No fevers, chills, nausea, vomiting, or diarrhea. Objective Constitutional Well-nourished and well-hydrated in no acute distress. Vitals Time Taken: 11:42 AM, Height: 68 in, Weight: 220 lbs, BMI: 33.4, Temperature: 98.2 F, Pulse: 81 bpm, Respiratory Rate: 18 breaths/min, Blood Pressure: 186/81 mmHg. Respiratory normal breathing without difficulty. Psychiatric this patient is able to make decisions and demonstrates good insight into disease process. Alert and Oriented x 3. pleasant and cooperative. General Notes: Upon inspection patient's wound bed did require sharp debridement of clearway some of the necrotic debris he tolerated this today without complication postdebridement the wound bed appears to be doing much better which is great news and I am very pleased in that regard. Integumentary (Hair, Skin) Wound #1 status is Open. Original cause of wound was Pressure Injury. The date acquired was: 06/19/2021. The wound has been in treatment 23 weeks. The wound is located on the Left Calcaneus. The wound measures 2.4cm length x 2.8cm width x 0.2cm depth; 5.278cm^2 area and 1.056cm^3 volume. There is Fat  Layer (Subcutaneous Tissue) exposed. There is a medium amount of serosanguineous drainage noted. There is small (1-33%) red, hyper - granulation within the wound bed. 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 ([id number]) Procedures Wound #1 Pre-procedure diagnosis of Wound #1 is a Diabetic Wound/Ulcer of the Lower Extremity located on the Left Calcaneus .Severity of Tissue Pre Debridement is: Fat layer exposed. There was a Excisional Skin/Subcutaneous Tissue Debridement with a total area of 6.72 sq cm performed by [name]., [name]. With the following instrument(s): Curette to remove Viable and Non-Viable tissue/material. Material removed includes Subcutaneous Tissue and Slough and. A time out was conducted at 12:01, prior to the start of the procedure. A Minimum amount of bleeding was controlled with Pressure. The procedure was tolerated well. Post Debridement Measurements: 2.4cm length x 2.8cm width x 0.3cm depth; 1.583cm^3 volume. Character of Wound/Ulcer Post Debridement is stable. Severity of Tissue Post Debridement is: Fat layer exposed. Post procedure Diagnosis Wound #1: Same as Pre-Procedure Plan Follow-up Appointments: Return Appointment in 1 week. Nurse Visit as needed Bathing/ Shower/ Hygiene: Wash wounds with antibacterial soap and water. May shower; gently cleanse wound with antibacterial soap, rinse and pat dry prior to dressing wounds - shower with dressing on and remove after shower No tub bath. Edema Control - Lymphedema / Segmental Compressive Device / Other: Elevate, Exercise Daily and Avoid Standing for Browder Periods of Time. Elevate leg(s) parallel to the floor when  sitting. DO YOUR BEST to sleep in the bed at night. DO NOT sleep in your recliner. Sabino hours of sitting in a recliner leads to swelling of the legs and/or potential wounds on your backside. WOUND #1: - Calcaneus Wound Laterality: Left Cleanser: Normal Saline 3 x Per Week/30 Days Discharge Instructions: Wash your hands with soap and water. Remove old dressing, discard into plastic bag and place into trash. Cleanse the wound with Normal Saline prior to applying a clean dressing using gauze sponges, not tissues or cotton balls. Do not scrub or use excessive force. Pat dry using gauze sponges, not tissue or cotton balls. Primary Dressing: Prisma 4.34 (in) (DME) (Dispense  As Written) 3 x Per Week/30 Days Discharge Instructions: Moisten w/normal saline or sterile water; Cover wound as directed. Do not remove from wound bed. Secondary Dressing: (SILCONE BORDER) Zetuvit Plus SILICONE BORDER Dressing 5x5 (in/in) (DME) (Generic) 3 x Per Week/30 Days Discharge Instructions: Please do not put silicone bordered dressings under wraps. Use non-bordered dressing only. 1. I am going to suggest that we go ahead and continue with the recommendation for the silver collagen which is still the best way to go at this point. 2. We are going to switch to using a bordered foam dressing to cover which I think should do quite well. 3. I am also can recommend that the patient continue to monitor for any signs of worsening or infection and if anything changes he should contact the office and let me know as soon as possible. We will see patient back for reevaluation in 1 week here in the clinic. If anything worsens or changes patient will contact our office for additional recommendations. Electronic Signature(s) Signed: 01/19/2022 5:20:29 PM By: Gerald KelpStone III, Valaree Fresquez [name] Entered By: Gerald KelpStone III, Zahrah Sutherlin on 01/19/2022 17:20:29 [name], [name]  ([id number]) -------------------------------------------------------------------------------- SuperBill Details Patient Name: [name], [name] Date of Service: 01/19/2022 Medical Record Number: [id number] Patient Account Number: [id number] Date of Birth/Sex: 07/20/1955 (66 y.o. M) Treating [name]: Gerald CoventryWoody, Gerald Primary Care Provider: Gershon CraneFry, Stephen Other Clinician: Betha LoaVenable, Angie Referring Provider: Gershon CraneFry, Stephen Treating Provider/Extender: Allen DerryStone, Mayana Irigoyen Weeks in Treatment: 23 Diagnosis Coding ICD-10 Codes Code Description E11.621 Type 2 diabetes mellitus with foot ulcer L97.422 Non-pressure chronic ulcer of left heel and midfoot with fat layer exposed M86.672 Other chronic osteomyelitis, left ankle and foot I87.2 Venous insufficiency (chronic) (peripheral) I73.89 Other specified peripheral vascular diseases I10 Essential (primary) hypertension Facility Procedures CPT4 Code: 0865784636100012 Description: 11042 - DEB SUBQ TISSUE 20 SQ CM/< Modifier: Quantity: 1 CPT4 Code: Description: ICD-10 Diagnosis Description L97.422 Non-pressure chronic ulcer of left heel and midfoot with fat layer exp Modifier: osed Quantity: Physician Procedures CPT4 Code: 96295286770168 Description: 11042 - WC PHYS SUBQ TISS 20 SQ CM Modifier: Quantity: 1 CPT4 Code: Description: ICD-10 Diagnosis Description L97.422 Non-pressure chronic ulcer of left heel and midfoot with fat layer exp Modifier: osed Quantity: Electronic Signature(s) Signed: 01/19/2022 5:32:05 PM By: Gerald KelpStone III, Jamarr Treinen [name] Entered By: Gerald KelpStone III, Yuridiana Formanek on 01/19/2022 17:32:05

## 2022-01-23 NOTE — Progress Notes (Signed)
Tsuchiya, Gershom Viona Gilmore ([id number]) Visit Report for 01/19/2022 Arrival Information Details Patient Name: Cabacungan, Kendricks W. Date of Service: 01/19/2022 11:15 AM Medical Record Number: [id number] Patient Account Number: [id number] Date of Birth/Sex: 11/12/1955 (66 y.o. M) Treating RN: Cornell Barman Primary Care Avalynne Diver: Alysia Penna Other Clinician: Massie Kluver Referring Nichalos Brenton: Alysia Penna Treating Michaelpaul Apo/Extender: Skipper Cliche in Treatment: 23 Visit Information History Since Last Visit All ordered tests and consults were completed: No Patient Arrived: [name] Added or deleted any medications: No Arrival Time: 11:41 Any new allergies or adverse reactions: No Transfer Assistance: None Had a fall or experienced change in No Patient Requires Transmission-Based No activities of daily living that may affect Precautions: risk of falls: Patient Has Alerts: Yes Hospitalized since last visit: No Patient Alerts: ABI R >1.0 [place] TBI Pain Present Now: No 0.50 ABI L 0.98 Electronic Signature(s) Signed: 01/23/2022 4:15:07 PM By: Massie Kluver Entered By: Massie Kluver on 01/19/2022 11:42:14 Mustard, Delynn W. ([id number]) -------------------------------------------------------------------------------- Clinic Level of Care Assessment Details Patient Name: Donahue, Jessejames W. Date of Service: 01/19/2022 11:15 AM Medical Record Number: [id number] Patient Account Number: [id number] Date of Birth/Sex: 1955/12/29 (66 y.o. M) Treating RN: Cornell Barman Primary Care Mignonne Afonso: Alysia Penna Other Clinician: Massie Kluver Referring Caedyn Tassinari: Alysia Penna Treating Jammie Troup/Extender: Skipper Cliche in Treatment: 23 Clinic Level of Care Assessment Items TOOL 1 Quantity Score []  - Use when EandM and Procedure is performed on INITIAL visit 0 ASSESSMENTS - Nursing Assessment / Reassessment []  - General Physical Exam (combine w/ comprehensive assessment (listed just below) when performed on new 0 pt. evals) []  -  0 Comprehensive Assessment (HX, ROS, Risk Assessments, Wounds Hx, etc.) ASSESSMENTS - Wound and Skin Assessment / Reassessment []  - Dermatologic / Skin Assessment (not related to wound area) 0 ASSESSMENTS - Ostomy and/or Continence Assessment and Care []  - Incontinence Assessment and Management 0 []  - 0 Ostomy Care Assessment and Management (repouching, etc.) PROCESS - Coordination of Care []  - Simple Patient / Family Education for ongoing care 0 []  - 0 Complex (extensive) Patient / Family Education for ongoing care []  - 0 Staff obtains Programmer, systems, Records, Test Results / Process Orders []  - 0 Staff telephones HHA, Nursing Homes / Clarify orders / etc []  - 0 Routine Transfer to another Facility (non-emergent condition) []  - 0 Routine Hospital Admission (non-emergent condition) []  - 0 New Admissions / Biomedical engineer / Ordering NPWT, Apligraf, etc. []  - 0 Emergency Hospital Admission (emergent condition) PROCESS - Special Needs []  - Pediatric / Minor Patient Management 0 []  - 0 Isolation Patient Management []  - 0 Hearing / Language / Visual special needs []  - 0 Assessment of Community assistance (transportation, D/C planning, etc.) []  - 0 Additional assistance / Altered mentation []  - 0 Support Surface(s) Assessment (bed, cushion, seat, etc.) INTERVENTIONS - Miscellaneous []  - External ear exam 0 []  - 0 Patient Transfer (multiple staff / Civil Service fast streamer / Similar devices) []  - 0 Simple Staple / Suture removal (25 or less) []  - 0 Complex Staple / Suture removal (26 or more) []  - 0 Hypo/Hyperglycemic Management (do not check if billed separately) []  - 0 Ankle / Brachial Index (ABI) - do not check if billed separately Has the patient been seen at the hospital within the last three years: Yes Total Score: 0 Level Of Care: ____ [name] ([id number]) Electronic Signature(s) Signed: 01/23/2022 4:15:07 PM By: Massie Kluver Entered By: Massie Kluver on 01/19/2022  12:08:05 Nygren, Norvel W. ([id number]) -------------------------------------------------------------------------------- Encounter Discharge Information Details Patient  Name: Hatchel, Maxine W. Date of Service: 01/19/2022 11:15 AM Medical Record Number: [id number] Patient Account Number: [id number] Date of Birth/Sex: May 12, 1956 (66 y.o. M) Treating RN: Huel Coventry Primary Care Eimy Plaza: [name] Other Clinician: Betha Loa Referring Elisia Stepp: [name] Treating Delight Bickle/Extender: Rowan Blase in Treatment: 23 Encounter Discharge Information Items Post Procedure Vitals Discharge Condition: Stable Temperature (F): 98.2 Ambulatory Status: Cane Pulse (bpm): 81 Discharge Destination: Home Respiratory Rate (breaths/min): 16 Transportation: Private Auto Blood Pressure (mmHg): 186/81 Accompanied By: self Schedule Follow-up Appointment: Yes Clinical Summary of Care: Electronic Signature(s) Signed: 01/23/2022 4:15:07 PM By: Betha Loa Entered By: Betha Loa on 01/19/2022 12:09:23 Hendriks, Wyman W. ([id number]) -------------------------------------------------------------------------------- Lower Extremity Assessment Details Patient Name: Spivack, Judah W. Date of Service: 01/19/2022 11:15 AM Medical Record Number: [id number] Patient Account Number: [id number] Date of Birth/Sex: [birth year] (66 y.o. M) Treating RN: Huel Coventry Primary Care Rihaan Barrack: [name] Other Clinician: Betha Loa Referring Lexington Devine: [name] Treating Itzabella Sorrels/Extender: Allen Derry Weeks in Treatment: 23 Edema Assessment Assessed: Kyra Searles: Yes] Franne Forts: No] Edema: [Left: Ye] [Right: s] Calf Left: Right: Point of Measurement: 30 cm From Medial Instep 38.5 cm Ankle Left: Right: Point of Measurement: 10 cm From Medial Instep 26.7 cm Vascular Assessment Pulses: Dorsalis Pedis Palpable: [Left:Yes] Electronic Signature(s) Signed: 01/20/2022 3:39:08 PM By: Elliot Gurney, BSN, RN, CWS, Kim RN, BSN Signed:  01/23/2022 4:15:07 PM By: Betha Loa Entered By: Betha Loa on 01/19/2022 11:54:11 Chiarelli, Kentaro W. ([id number]) -------------------------------------------------------------------------------- Multi Wound Chart Details Patient Name: Leduc, Kimberley W. Date of Service: 01/19/2022 11:15 AM Medical Record Number: [id number] Patient Account Number: [id number] Date of Birth/Sex: 1956-05-31 (66 y.o. M) Treating RN: Huel Coventry Primary Care Tryphena Perkovich: [name] Other Clinician: Betha Loa Referring Jakyren Fluegge: [name] Treating Jadore Veals/Extender: Rowan Blase in Treatment: 23 Vital Signs Height(in): 68 Pulse(bpm): 81 Weight(lbs): 220 Blood Pressure(mmHg): 186/81 Body Mass Index(BMI): 33.4 Temperature(F): 98.2 Respiratory Rate(breaths/min): 18 Photos: [N/A:N/A] Wound Location: Left Calcaneus N/A N/A Wounding Event: Pressure Injury N/A N/A Primary Etiology: Diabetic Wound/Ulcer of the Lower N/A N/A Extremity Comorbid History: Cataracts, Hypertension, Type II N/A N/A Diabetes, Neuropathy Date Acquired: 06/19/2021 N/A N/A Weeks of Treatment: 23 N/A N/A Wound Status: Open N/A N/A Wound Recurrence: No N/A N/A Measurements L x W x D (cm) 2.4x2.8x0.2 N/A N/A Area (cm) : 5.278 N/A N/A Volume (cm) : 1.056 N/A N/A % Reduction in Area: -132.50% N/A N/A % Reduction in Volume: -365.20% N/A N/A Classification: Grade 2 N/A N/A Exudate Amount: Medium N/A N/A Exudate Type: Serosanguineous N/A N/A Exudate Color: red, brown N/A N/A Granulation Amount: Small (1-33%) N/A N/A Granulation Quality: Red, Hyper-granulation N/A N/A Exposed Structures: Fat Layer (Subcutaneous Tissue): N/A N/A Yes Epithelialization: Small (1-33%) N/A N/A Treatment Notes Electronic Signature(s) Signed: 01/23/2022 4:15:07 PM By: Betha Loa Entered By: Betha Loa on 01/19/2022 11:54:21 Kastelic, Marcelino W.  ([id number]) -------------------------------------------------------------------------------- Multi-Disciplinary Care Plan Details Patient Name: Abramo, Aundray W. Date of Service: 01/19/2022 11:15 AM Medical Record Number: [id number] Patient Account Number: [id number] Date of Birth/Sex: April 29, 1956 (66 y.o. M) Treating RN: Huel Coventry Primary Care Jermie Hippe: [name] Other Clinician: Betha Loa Referring Wylan Gentzler: [name] Treating Tristan Proto/Extender: Rowan Blase in Treatment: 23 Active Inactive Wound/Skin Impairment Nursing Diagnoses: Impaired tissue integrity Knowledge deficit related to ulceration/compromised skin integrity Goals: Ulcer/skin breakdown will have a volume reduction of 30% by week 4 Date Initiated: 08/08/2021 Date Inactivated: 12/19/2021 Target Resolution Date: 09/05/2021 Goal Status: Unmet Unmet Reason: comorbities Ulcer/skin breakdown will have a volume reduction of 50% by week 8 Date Initiated:  08/08/2021 Date Inactivated: 12/19/2021 Target Resolution Date: 10/03/2021 Goal Status: Unmet Unmet Reason: comorbities Ulcer/skin breakdown will have a volume reduction of 80% by week 12 Date Initiated: 08/08/2021 Date Inactivated: 12/19/2021 Target Resolution Date: 10/31/2021 Goal Status: Unmet Unmet Reason: comorbities Ulcer/skin breakdown will heal within 14 weeks Date Initiated: 08/08/2021 Target Resolution Date: 11/14/2021 Goal Status: Active Interventions: Assess patient/caregiver ability to obtain necessary supplies Assess patient/caregiver ability to perform ulcer/skin care regimen upon admission and as needed Assess ulceration(s) every visit Provide education on ulcer and skin care Notes: Electronic Signature(s) Signed: 01/20/2022 3:39:08 PM By: Gretta Cool, BSN, RN, CWS, Kim RN, BSN Signed: 01/23/2022 4:15:07 PM By: Massie Kluver Entered By: Massie Kluver on 01/19/2022 11:54:15 Hilger, Emigdio W.  ([id number]) -------------------------------------------------------------------------------- Pain Assessment Details Patient Name: Saindon, Abdurahman W. Date of Service: 01/19/2022 11:15 AM Medical Record Number: [id number] Patient Account Number: [id number] Date of Birth/Sex: [birth year] (66 y.o. M) Treating RN: Cornell Barman Primary Care Aleisa Howk: Alysia Penna Other Clinician: Massie Kluver Referring Rosette Bellavance: Alysia Penna Treating Taunja Brickner/Extender: Skipper Cliche in Treatment: 23 Active Problems Location of Pain Severity and Description of Pain Patient Has Paino No Site Locations Pain Management and Medication Current Pain Management: Electronic Signature(s) Signed: 01/20/2022 3:39:08 PM By: Gretta Cool, BSN, RN, CWS, Kim RN, BSN Signed: 01/23/2022 4:15:07 PM By: Massie Kluver Entered By: Massie Kluver on 01/19/2022 11:45:00 Semel, Jacksen W. ([id number]) -------------------------------------------------------------------------------- Patient/Caregiver Education Details Patient Name: Henner, Saket W. Date of Service: 01/19/2022 11:15 AM Medical Record Number: [id number] Patient Account Number: [id number] Date of Birth/Gender: 11-30-1955 (66 y.o. M) Treating RN: Cornell Barman Primary Care Physician: Alysia Penna Other Clinician: Massie Kluver Referring Physician: Alysia Penna Treating Physician/Extender: Skipper Cliche in Treatment: 23 Education Assessment Education Provided To: Patient Education Topics Provided Wound/Skin Impairment: Handouts: Other: continue wound care as directed Methods: Explain/Verbal Responses: State content correctly Electronic Signature(s) Signed: 01/23/2022 4:15:07 PM By: Massie Kluver Entered By: Massie Kluver on 01/19/2022 12:08:34 Alemany, Elven W. ([id number]) -------------------------------------------------------------------------------- Wound Assessment Details Patient Name: Tatem, Jeffre W. Date of Service: 01/19/2022 11:15 AM Medical Record Number:  [id number] Patient Account Number: [id number] Date of Birth/Sex: Apr 27, 1956 (66 y.o. M) Treating RN: Cornell Barman Primary Care Adewale Pucillo: Alysia Penna Other Clinician: Massie Kluver Referring Cassi Jenne: Alysia Penna Treating Tymeir Weathington/Extender: Jeri Cos Weeks in Treatment: 23 Wound Status Wound Number: 1 Primary Etiology: Diabetic Wound/Ulcer of the Lower Extremity Wound Location: Left Calcaneus Wound Status: Open Wounding Event: Pressure Injury Comorbid Cataracts, Hypertension, Type II Diabetes, History: Neuropathy Date Acquired: 06/19/2021 Weeks Of Treatment: 23 Clustered Wound: No Photos Wound Measurements Length: (cm) 2.4 Width: (cm) 2.8 Depth: (cm) 0.2 Area: (cm) 5.278 Volume: (cm) 1.056 % Reduction in Area: -132.5% % Reduction in Volume: -365.2% Epithelialization: Small (1-33%) Wound Description Classification: Grade 2 Exudate Amount: Medium Exudate Type: Serosanguineous Exudate Color: red, brown Foul Odor After Cleansing: No Slough/Fibrino Yes Wound Bed Granulation Amount: Small (1-33%) Exposed Structure Granulation Quality: Red, Hyper-granulation Fat Layer (Subcutaneous Tissue) Exposed: Yes Treatment Notes Wound #1 (Calcaneus) Wound Laterality: Left Cleanser Normal Saline Discharge Instruction: Wash your hands with soap and water. Remove old dressing, discard into plastic bag and place into trash. Cleanse the wound with Normal Saline prior to applying a clean dressing using gauze sponges, not tissues or cotton balls. Do not scrub or use excessive force. Pat dry using gauze sponges, not tissue or cotton balls. Peri-Wound Care Topical Primary Dressing Prisma 4.34 (in) Mcnall, Kellen W. ([id number]) Discharge Instruction: Moisten w/normal saline or sterile water; Cover wound as directed. Do not remove from wound  bed. Secondary Dressing (SILCONE BORDER) Zetuvit Plus SILICONE BORDER Dressing 5x5 (in/in) Discharge Instruction: Please do not put silicone bordered  dressings under wraps. Use non-bordered dressing only. Secured With Compression Wrap Compression Stockings Facilities manager) Signed: 01/20/2022 3:39:08 PM By: Elliot Gurney, BSN, RN, CWS, Kim RN, BSN Signed: 01/23/2022 4:15:07 PM By: Betha Loa Entered By: Betha Loa on 01/19/2022 11:52:53 Fahringer, Wasyl W. ([id number]) -------------------------------------------------------------------------------- Vitals Details Patient Name: Roussell, Noriel W. Date of Service: 01/19/2022 11:15 AM Medical Record Number: [id number] Patient Account Number: [id number] Date of Birth/Sex: 05-16-1956 (66 y.o. M) Treating RN: Huel Coventry Primary Care Gwenivere Hiraldo: [name] Other Clinician: Betha Loa Referring Mathews Stuhr: [name] Treating Kenyetta Fife/Extender: Rowan Blase in Treatment: 23 Vital Signs Time Taken: 11:42 Temperature (F): 98.2 Height (in): 68 Pulse (bpm): 81 Weight (lbs): 220 Respiratory Rate (breaths/min): 18 Body Mass Index (BMI): 33.4 Blood Pressure (mmHg): 186/81 Reference Range: 80 - 120 mg / dl Electronic Signature(s) Signed: 01/23/2022 4:15:07 PM By: Betha Loa Entered By: Betha Loa on 01/19/2022 11:44:42

## 2022-01-30 ENCOUNTER — Encounter: Payer: No Typology Code available for payment source | Admitting: Physician Assistant

## 2022-01-30 DIAGNOSIS — E11621 Type 2 diabetes mellitus with foot ulcer: Secondary | ICD-10-CM | POA: Diagnosis not present

## 2022-02-01 NOTE — Progress Notes (Signed)
[name] [name] ([id number]) Visit Report for 01/30/2022 Arrival Information Details Patient Name: [name] [name] W. Date of Service: 01/30/2022 11:00 AM Medical Record Number: [id number] Patient Account Number: [id number] Date of Birth/Sex: 02-28-1956 (66 y.o. M) Treating RN: [name] [name] Primary Care [name] [name]: Alysia Penna Other Clinician: Referring Kelby Adell: Alysia Penna Treating [name] [name]/Extender: [name] [name] in Treatment: 25 Visit Information History Since Last Visit All ordered tests and consults were completed: No Patient Arrived: [name] [name] Added or deleted any medications: No Arrival Time: 10:59 Any new allergies or adverse reactions: No Accompanied By: self Had a fall or experienced change in No Transfer Assistance: None activities of daily living that may affect Patient Identification Verified: Yes risk of falls: Secondary Verification Process Completed: Yes Signs or symptoms of abuse/neglect since last visito No Patient Requires Transmission-Based No Hospitalized since last visit: No Precautions: Implantable device outside of the clinic excluding No Patient Has Alerts: Yes cellular tissue based products placed in the center Patient Alerts: ABI R >1.0 [place] TBI since last visit: 0.50 Has Dressing in Place as Prescribed: Yes ABI L 0.98 Pain Present Now: No Electronic Signature(s) Signed: 01/31/2022 8:41:24 AM By: [name] Coria RN Entered By: [name] [name] on 01/30/2022 11:03:16 [name] [name] W. ([id number]) -------------------------------------------------------------------------------- Clinic Level of Care Assessment Details Patient Name: [name] [name] W. Date of Service: 01/30/2022 11:00 AM Medical Record Number: [id number] Patient Account Number: [id number] Date of Birth/Sex: 12-May-1956 (66 y.o. M) Treating RN: [name] [name] Primary Care Willye Javier: Alysia Penna Other Clinician: Referring Cherika Jessie: Alysia Penna Treating Wilburta Milbourn/Extender: [name] [name] in Treatment:  25 Clinic Level of Care Assessment Items TOOL 4 Quantity Score X - Use when only an EandM is performed on FOLLOW-UP visit 1 0 ASSESSMENTS - Nursing Assessment / Reassessment X - Reassessment of Co-morbidities (includes updates in patient status) 1 10 X- 1 5 Reassessment of Adherence to Treatment Plan ASSESSMENTS - Wound and Skin Assessment / Reassessment X - Simple Wound Assessment / Reassessment - one wound 1 5 []  - 0 Complex Wound Assessment / Reassessment - multiple wounds []  - 0 Dermatologic / Skin Assessment (not related to wound area) ASSESSMENTS - Focused Assessment []  - Circumferential Edema Measurements - multi extremities 0 []  - 0 Nutritional Assessment / Counseling / Intervention []  - 0 Lower Extremity Assessment (monofilament, tuning fork, pulses) []  - 0 Peripheral Arterial Disease Assessment (using hand held doppler) ASSESSMENTS - Ostomy and/or Continence Assessment and Care []  - Incontinence Assessment and Management 0 []  - 0 Ostomy Care Assessment and Management (repouching, etc.) PROCESS - Coordination of Care X - Simple Patient / Family Education for ongoing care 1 15 []  - 0 Complex (extensive) Patient / Family Education for ongoing care []  - 0 Staff obtains Programmer, systems, Records, Test Results / Process Orders []  - 0 Staff telephones HHA, Nursing Homes / Clarify orders / etc []  - 0 Routine Transfer to another Facility (non-emergent condition) []  - 0 Routine Hospital Admission (non-emergent condition) []  - 0 New Admissions / Biomedical engineer / Ordering NPWT, Apligraf, etc. []  - 0 Emergency Hospital Admission (emergent condition) X- 1 10 Simple Discharge Coordination []  - 0 Complex (extensive) Discharge Coordination PROCESS - Special Needs []  - Pediatric / Minor Patient Management 0 []  - 0 Isolation Patient Management []  - 0 Hearing / Language / Visual special needs []  - 0 Assessment of Community assistance (transportation, D/C planning,  etc.) []  - 0 Additional assistance / Altered mentation []  - 0 Support Surface(s) Assessment (bed, cushion, seat, etc.) INTERVENTIONS - Wound Cleansing / Measurement  [name], [name] ([id number]) X- 1 5 Simple Wound Cleansing - one wound []  - 0 Complex Wound Cleansing - multiple wounds X- 1 5 Wound Imaging (photographs - any number of wounds) []  - 0 Wound Tracing (instead of photographs) X- 1 5 Simple Wound Measurement - one wound []  - 0 Complex Wound Measurement - multiple wounds INTERVENTIONS - Wound Dressings X - Small Wound Dressing one or multiple wounds 1 10 []  - 0 Medium Wound Dressing one or multiple wounds []  - 0 Large Wound Dressing one or multiple wounds []  - 0 Application of Medications - topical []  - 0 Application of Medications - injection INTERVENTIONS - Miscellaneous []  - External ear exam 0 []  - 0 Specimen Collection (cultures, biopsies, blood, body fluids, etc.) []  - 0 Specimen(s) / Culture(s) sent or taken to Lab for analysis []  - 0 Patient Transfer (multiple staff / Civil Service fast streamer / Similar devices) []  - 0 Simple Staple / Suture removal (25 or less) []  - 0 Complex Staple / Suture removal (26 or more) []  - 0 Hypo / Hyperglycemic Management (close monitor of Blood Glucose) []  - 0 Ankle / Brachial Index (ABI) - do not check if billed separately X- 1 5 Vital Signs Has the patient been seen at the hospital within the last three years: Yes Total Score: 75 Level Of Care: New/Established - Level 2 Electronic Signature(s) Signed: 01/31/2022 8:41:24 AM By: [name] Coria RN Entered By: [name] [name] on 01/30/2022 11:32:34 [name] [name] W. ([id number]) -------------------------------------------------------------------------------- Encounter Discharge Information Details Patient Name: [name] [name] W. Date of Service: 01/30/2022 11:00 AM Medical Record Number: [id number] Patient Account Number: [id number] Date of Birth/Sex: 1955/08/21 (66 y.o. M) Treating RN: [name] [name] Primary Care December Hedtke: Alysia Penna Other Clinician: Referring Kacey Dysert: Alysia Penna Treating Jeimy Bickert/Extender: [name] [name] in Treatment: 25 Encounter Discharge Information Items Discharge Condition: Stable Ambulatory Status: Ambulatory Discharge Destination: Home Transportation: Private Auto Accompanied By: self Schedule Follow-up Appointment: Yes Clinical Summary of Care: Electronic Signature(s) Signed: 01/31/2022 8:41:24 AM By: [name] Coria RN Entered By: [name] [name] on 01/30/2022 11:33:21 Charpentier, Yuvraj W. ([id number]) -------------------------------------------------------------------------------- Lower Extremity Assessment Details Patient Name: [name] [name] W. Date of Service: 01/30/2022 11:00 AM Medical Record Number: [id number] Patient Account Number: [id number] Date of Birth/Sex: [birth year] (66 y.o. M) Treating RN: [name] [name] Primary Care Laterria Lasota: Alysia Penna Other Clinician: Referring Nevayah Faust: Alysia Penna Treating Josiyah Tozzi/Extender: Jeri Cos Weeks in Treatment: 25 Edema Assessment Assessed: [Left: No] [Right: No] Edema: [Left: Ye] [Right: s] Calf Left: Right: Point of Measurement: 30 cm From Medial Instep 36 cm Ankle Left: Right: Point of Measurement: 10 cm From Medial Instep 25 cm Vascular Assessment Pulses: Dorsalis Pedis Palpable: [Left:Yes] Electronic Signature(s) Signed: 01/31/2022 8:41:24 AM By: [name] Coria RN Entered By: [name] [name] on 01/30/2022 11:09:27 Batch, Koji W. ([id number]) -------------------------------------------------------------------------------- Multi Wound Chart Details Patient Name: [name] [name] W. Date of Service: 01/30/2022 11:00 AM Medical Record Number: [id number] Patient Account Number: [id number] Date of Birth/Sex: 10-Jun-1956 (66 y.o. M) Treating RN: [name] [name] Primary Care Trentan Trippe: Alysia Penna Other Clinician: Referring Ishmel Acevedo: Alysia Penna Treating Ikey Omary/Extender: [name] [name] in Treatment:  25 Vital Signs Height(in): 34 Pulse(bpm): 12 Weight(lbs): 220 Blood Pressure(mmHg): 189/74 Body Mass Index(BMI): 33.4 Temperature(F): 98.2 Respiratory Rate(breaths/min): 18 Photos: [N/A:N/A] Wound Location: Left Calcaneus N/A N/A Wounding Event: Pressure Injury N/A N/A Primary Etiology: Diabetic Wound/Ulcer of the Lower N/A N/A Extremity Comorbid History: Cataracts, Hypertension, Type II N/A N/A Diabetes, Neuropathy Date Acquired: 06/19/2021 N/A N/A Weeks of Treatment: 25 N/A N/A Wound Status:  Open N/A N/A Wound Recurrence: No N/A N/A Measurements L x W x D (cm) 2.2x2.7x0.2 N/A N/A Area (cm) : 4.665 N/A N/A Volume (cm) : 0.933 N/A N/A % Reduction in Area: -105.50% N/A N/A % Reduction in Volume: -311.00% N/A N/A Classification: Grade 2 N/A N/A Exudate Amount: Medium N/A N/A Exudate Type: Serosanguineous N/A N/A Exudate Color: red, brown N/A N/A Granulation Amount: Medium (34-66%) N/A N/A Granulation Quality: Red, Hyper-granulation N/A N/A Necrotic Amount: Medium (34-66%) N/A N/A Exposed Structures: Fat Layer (Subcutaneous Tissue): N/A N/A Yes Epithelialization: Small (1-33%) N/A N/A Treatment Notes Electronic Signature(s) Signed: 01/31/2022 8:41:24 AM By: Yevonne Pax RN Entered By: Yevonne Pax on 01/30/2022 11:09:46 Norrod, Jasmine W. ([id number]) -------------------------------------------------------------------------------- Multi-Disciplinary Care Plan Details Patient Name: [name] [name] W. Date of Service: 01/30/2022 11:00 AM Medical Record Number: [id number] Patient Account Number: [id number] Date of Birth/Sex: 07-06-1955 (66 y.o. M) Treating RN: Yevonne Pax Primary Care Indianna Boran: [name] Other Clinician: Referring Ela Moffat: [name] Treating Wynonia Medero/Extender: Rowan Blase in Treatment: 25 Active Inactive Wound/Skin Impairment Nursing Diagnoses: Impaired tissue integrity Knowledge deficit related to ulceration/compromised skin  integrity Goals: Ulcer/skin breakdown will have a volume reduction of 30% by week 4 Date Initiated: 08/08/2021 Date Inactivated: 12/19/2021 Target Resolution Date: 09/05/2021 Goal Status: Unmet Unmet Reason: comorbities Ulcer/skin breakdown will have a volume reduction of 50% by week 8 Date Initiated: 08/08/2021 Date Inactivated: 12/19/2021 Target Resolution Date: 10/03/2021 Goal Status: Unmet Unmet Reason: comorbities Ulcer/skin breakdown will have a volume reduction of 80% by week 12 Date Initiated: 08/08/2021 Date Inactivated: 12/19/2021 Target Resolution Date: 10/31/2021 Goal Status: Unmet Unmet Reason: comorbities Ulcer/skin breakdown will heal within 14 weeks Date Initiated: 08/08/2021 Target Resolution Date: 11/14/2021 Goal Status: Active Interventions: Assess patient/caregiver ability to obtain necessary supplies Assess patient/caregiver ability to perform ulcer/skin care regimen upon admission and as needed Assess ulceration(s) every visit Provide education on ulcer and skin care Notes: Electronic Signature(s) Signed: 01/31/2022 8:41:24 AM By: Yevonne Pax RN Entered By: Yevonne Pax on 01/30/2022 11:09:39 [name] [name] W. ([id number]) -------------------------------------------------------------------------------- Pain Assessment Details Patient Name: [name] [name] W. Date of Service: 01/30/2022 11:00 AM Medical Record Number: [id number] Patient Account Number: [id number] Date of Birth/Sex: 05-05-1956 (66 y.o. M) Treating RN: Yevonne Pax Primary Care Printice Hellmer: [name] Other Clinician: Referring Yeraldin Litzenberger: [name] Treating Tehani Mersman/Extender: Rowan Blase in Treatment: 25 Active Problems Location of Pain Severity and Description of Pain Patient Has Paino No Site Locations Pain Management and Medication Current Pain Management: Electronic Signature(s) Signed: 01/31/2022 8:41:24 AM By: Yevonne Pax RN Entered By: Yevonne Pax on 01/30/2022 11:06:58 [name] [name] W.  ([id number]) -------------------------------------------------------------------------------- Patient/Caregiver Education Details Patient Name: [name] [name] W. Date of Service: 01/30/2022 11:00 AM Medical Record Number: [id number] Patient Account Number: [id number] Date of Birth/Gender: 02-Dec-1955 (66 y.o. M) Treating RN: Yevonne Pax Primary Care Physician: [name] Other Clinician: Referring Physician: [name] Treating Physician/Extender: Rowan Blase in Treatment: 25 Education Assessment Education Provided To: Patient Education Topics Provided Wound/Skin Impairment: Methods: Explain/Verbal Responses: State content correctly Electronic Signature(s) Signed: 01/31/2022 8:41:24 AM By: Yevonne Pax RN Entered By: Yevonne Pax on 01/30/2022 11:32:53 Lengacher, Adin W. ([id number]) -------------------------------------------------------------------------------- Wound Assessment Details Patient Name: Bacci, Calton W. Date of Service: 01/30/2022 11:00 AM Medical Record Number: [id number] Patient Account Number: [id number] Date of Birth/Sex: Jul 27, 1955 (66 y.o. M) Treating RN: Yevonne Pax Primary Care Lindyn Vossler: [name] Other Clinician: Referring Tami Blass: [name] Treating Hulet Ehrmann/Extender: Rowan Blase in Treatment: 25 Wound Status Wound Number: 1 Primary Etiology: Diabetic Wound/Ulcer of the  Lower Extremity Wound Location: Left Calcaneus Wound Status: Open Wounding Event: Pressure Injury Comorbid Cataracts, Hypertension, Type II Diabetes, History: Neuropathy Date Acquired: 06/19/2021 Weeks Of Treatment: 25 Clustered Wound: No Photos Wound Measurements Length: (cm) 2.2 Width: (cm) 2.7 Depth: (cm) 0.2 Area: (cm) 4.665 Volume: (cm) 0.933 % Reduction in Area: -105.5% % Reduction in Volume: -311% Epithelialization: Small (1-33%) Tunneling: No Undermining: No Wound Description Classification: Grade 2 Exudate Amount: Medium Exudate Type:  Serosanguineous Exudate Color: red, brown Foul Odor After Cleansing: No Slough/Fibrino Yes Wound Bed Granulation Amount: Medium (34-66%) Exposed Structure Granulation Quality: Red, Hyper-granulation Fat Layer (Subcutaneous Tissue) Exposed: Yes Necrotic Amount: Medium (34-66%) Necrotic Quality: Adherent Slough Treatment Notes Wound #1 (Calcaneus) Wound Laterality: Left Cleanser Normal Saline Discharge Instruction: Wash your hands with soap and water. Remove old dressing, discard into plastic bag and place into trash. Cleanse the wound with Normal Saline prior to applying a clean dressing using gauze sponges, not tissues or cotton balls. Do not scrub or use excessive force. Pat dry using gauze sponges, not tissue or cotton balls. Peri-Wound Care Topical Brallier, Furious W. ([id number]) Primary Dressing Prisma 4.34 (in) Discharge Instruction: Moisten w/normal saline or sterile water; Cover wound as directed. Do not remove from wound bed. Secondary Dressing (BORDER) Zetuvit Plus SILICONE BORDER Dressing 5x5 (in/in) Discharge Instruction: Please do not put silicone bordered dressings under wraps. Use non-bordered dressing only. Secured With Compression Wrap Compression Stockings Add-Ons Electronic Signature(s) Signed: 01/31/2022 8:41:24 AM By: Yevonne Pax RN Entered By: Yevonne Pax on 01/30/2022 11:07:32 Sieh, Abbie W. ([id number]) -------------------------------------------------------------------------------- Vitals Details Patient Name: Passarella, Carrie W. Date of Service: 01/30/2022 11:00 AM Medical Record Number: [id number] Patient Account Number: [id number] Date of Birth/Sex: 25-Sep-1955 (66 y.o. M) Treating RN: Yevonne Pax Primary Care Brinton Brandel: [name] Other Clinician: Referring Dacoda Finlay: [name] Treating Kaileena Obi/Extender: Rowan Blase in Treatment: 25 Vital Signs Time Taken: 11:05 Temperature (F): 98.2 Height (in): 68 Pulse (bpm): 72 Weight (lbs):  220 Respiratory Rate (breaths/min): 18 Body Mass Index (BMI): 33.4 Blood Pressure (mmHg): 189/74 Reference Range: 80 - 120 mg / dl Electronic Signature(s) Signed: 01/31/2022 8:41:24 AM By: Yevonne Pax RN Entered By: Yevonne Pax on 01/30/2022 11:06:40

## 2022-02-01 NOTE — Progress Notes (Addendum)
Koo, Yacob Viona Gilmore ([id number]) Visit Report for 01/30/2022 Chief Complaint Document Details Patient Name: [name], [name] Date of Service: 01/30/2022 11:00 AM Medical Record Number: [id number] Patient Account Number: [id number] Date of Birth/Sex: 01/26/1956 (66 y.o. M) Treating RN: Carlene Coria Primary Care Provider: Alysia Penna Other Clinician: Referring Provider: Alysia Penna Treating Provider/Extender: Skipper Cliche in Treatment: 25 Information Obtained from: Patient Chief Complaint Left heel ulcer Electronic Signature(s) Signed: 01/30/2022 11:17:06 AM By: Worthy Keeler PA-C Entered By: Worthy Keeler on 01/30/2022 11:17:05 [name], [name] ([id number]) -------------------------------------------------------------------------------- HPI Details Patient Name: [name], [name] Date of Service: 01/30/2022 11:00 AM Medical Record Number: [id number] Patient Account Number: [id number] Date of Birth/Sex: January 14, 1956 (66 y.o. M) Treating RN: Carlene Coria Primary Care Provider: Alysia Penna Other Clinician: Referring Provider: Alysia Penna Treating Provider/Extender: Skipper Cliche in Treatment: 25 History of Present Illness HPI Description: 08/08/2021 upon evaluation today patient appears to be doing somewhat poorly in regard to a heel ulcer that began as a result of the patient tells me him trying to get his foot into his shoes that were somewhat tight. This was around 4 January. He was using a shoehorn fairly aggressively and thinks that he may have caused the bruising at that time. Fortunately there does not appear to be any signs of infection at this point. Nonetheless he does have some definite necrotic tissue. This can need to be cleared away. Nonetheless unfortunately [name] we do not have clearance in my opinion therefore to go forward with an aggressive sharp debridement. Patient does have diabetes mellitus type 2 with the most recent hemoglobin A1c of 5.2 and that  was on June 2022. He also has a history of chronic venous insufficiency, peripheral vascular disease, and hypertension. Obviously with regard to the peripheral vascular disease we will continue to send him to vascular to quantify his ABIs and TBI's. 08/22/2021 upon evaluation today patient appears to be doing well with regard to his wound. This is definitely showing some signs of improvement which is great news and overall very pleased with where things stand today. There does not appear to be any evidence of active infection locally nor systemically which is great news and overall I feel like that this is showing signs of improvement. It is definitely much less irritated as compared to last week. I am very pleased in that regard I am good to be able to get a lot of this dry skin off today as well which is great news. 09/05/2021 upon evaluation today patient's heel actually showing signs of some improvement though there is still a bit of eschar noted in the center part of the wound. I do believe this is going require some sharp debridement to clear this away. The Iodosorb just is not quite cutting it. Fortunately there does not appear to be any signs of active infection at this time locally nor systemically. 09/12/2021 upon evaluation today patient appears to be doing well with regard to his wound. He has [name] tolerating the dressing changes I do believe that clearing off the eschar last week has [name] to help there is still some necrotic tissue removed today overall he seems to be doing quite well however. 09/20/2021 upon evaluation today patient appears to be doing well with regard to his wound he does have some necrotic tissue noted on the surface of the wound but I do feel like we are getting better here definitely making good progress. I do not see any signs  of infection which is great news and overall I think that we are on the right track. 09-26-2021 upon evaluation today patient appears to be doing  well with regard to his heel ulcer. This is actually measuring smaller and looking better today. Fortunately I do not see any evidence of active infection locally or systemically which is great news. 10-03-2021 upon evaluation today patient appears to be doing well currently in regard to his heel for the most part although he does have a little bit of what appears to be bruising in the base of the wound. I am not sure exactly where to get the pressure from but this very likely is happening when he is walking he tells me has [name] wearing some other shoes [name] a little bit more on his feet than normal. Fortunately I do not see any evidence of active infection at this time. I have contemplated put him in a different type of shoe or even a total contact cast to be honest. With that being said his balance is not good and I therefore have some concerns in that regard. 10-17-2021 upon evaluation today patient appears to be doing poorly in regard to his heel. Again this is showing signs of significant deep tissue injury which is bringing into question whether or not he may actually have something going on here which is more related to his blood flow. We have previously done arterial study though to be honest with ABIs right at 1 and noncompressible which tends to make me think this may be falsely elevated coupled with TBI's which were at 0.5 with biphasic waveforms I am not certain he is getting the blood flow needed to completely heal this wound. I discussed with him today that he may need to see vascular in order to discuss options for her further angiogram/evaluation in general. I would at least leave it up to them as to whether they feel the angiogram is necessary or not. Nonetheless I do believe pressure is also playing a component to this he does tell me he started sleeping with his heel on the bed not raised underneath the pillow already this was dark and discolored that may have [name] what triggered this  to get worse I am not really sure. 5/8; the wound seems to have less of a deep tissue injury but there is still eschar. Scant amount of purulent drainage some erythema medially extending up the heel. He has neuropathy no pain no systemic illness. Culture that we did last time showed moderate Proteus, moderate Enterococcus and moderate Pseudomonas. The Enterococcus will require ampicillin so he will require 2 antibiotics. He is using Santyl to the wound 10-31-2021 upon evaluation today patient appears to be doing well with regard to his wound all things considered. There is still necrotic tissue noted in the central portion of the wound which is not what we want to see but unfortunately I think this is just could be the case until we ensure he has good blood flow and can subsequently get things moving in a better direction. Fortunately I do not see any evidence of infection locally or systemically which is great news and overall I think that we are on the right track in that regard. I am going to go ahead and refill the Augmentin as well as the Cipro for him today he was given originally 10 days worth I would have her extend that for 7 days. 11-07-2021 upon evaluation today patient appears to be doing better in  regard to the overall appearance of his wound and I do feel like the [facility] is doing a good job here. Fortunately there does not appear to be any evidence of active infection locally nor systemically which is great news and overall I am extremely pleased with where things stand today. The patient is happy as well with the progress. Nonetheless I had extended his Augmentin and Cipro for him at the last visit I think that is going to need to be extended a bit further. That means he is probably can need some probiotics as well. 11-21-2021 upon evaluation today patient appears to be doing a little better in regard to size and is definitely making some progress here. With that being said he also  definitely has some areas that are still fairly thick with necrotic tissue. We discussed debridement yet again today and he is [place], Blayn W. ([id number]) amenable to doing some debridement as Demmon as its not too aggressive which I think we can definitely do. I do believe the [facility] is doing a great job and will definitely be continuing that as well. 12-05-2021 upon evaluation today patient appears to be doing well with regard to his heel. He has [name] tolerating the dressing changes without complication. Fortunately there does not appear to be any evidence of active infection locally or systemically at this time. No fevers, chills, nausea, vomiting, or diarrhea. 12-19-2021 upon evaluation today patient appears to be doing excellent in regard to his wound. Has [name] tolerating the Santyl in an excellent fashion. Fortunately I do not see any evidence of infection locally or systemically which is great news and overall I am very pleased with where we stand today. 01-02-2022 upon evaluation today patient appears to be doing excellent in regard to his wound. He has [name] tolerating the dressing changes without complication. Fortunately I do not see any evidence of active infection there is minimal amount of slough and biofilm buildup which I Minna perform debridement in regard to today but other than this overall I really feel like that the patient is doing excellent and I think he is ready to switch to a collagen based dressing. I think this is going to do well for him. 01-19-2022 upon evaluation today patient appears to be doing much better in regard to his wound. He has [name] tolerating the dressing changes without complication. Fortunately there does not appear to be any evidence of active infection locally or systemically at this time which is great news and overall I am extremely pleased with where we stand. There does not appear to be any evidence of active infection whatsoever which is great news. No  fevers, chills, nausea, vomiting, or diarrhea. 01-30-2022 upon evaluation today patient appears to be doing well currently in regard to his wound. This is actually showing signs of excellent improvement I am very pleased based on what we are seeing today. I do not see any evidence of active infection locally or systemically at this time. Electronic Signature(s) Signed: 01/30/2022 11:54:02 AM By: Worthy Keeler PA-C Entered By: Worthy Keeler on 01/30/2022 11:54:01 [name], [name] -------------------------------------------------------------------------------- Physical Exam Details Patient Name: Rambert, Noland W. Date of Service: 01/30/2022 11:00 AM Medical Record Number: [id number] Patient Account Number: [id number] Date of Birth/Sex: 03/17/1956 (66 y.o. M) Treating RN: Carlene Coria Primary Care Provider: Alysia Penna Other Clinician: Referring Provider: Alysia Penna Treating Provider/Extender: Skipper Cliche in Treatment: 21 Constitutional Well-nourished and well-hydrated in no acute distress. Respiratory normal breathing without difficulty.  Psychiatric this patient is able to make decisions and demonstrates good insight into disease process. Alert and Oriented x 3. pleasant and cooperative. Notes Patient's wound is actually showing excellent improvement which is great news and overall I am extremely pleased with where we are at this point. There does not appear to be any signs of active infection locally or systemically currently. Electronic Signature(s) Signed: 01/30/2022 11:54:23 AM By: Worthy Keeler PA-C Entered By: Worthy Keeler on 01/30/2022 11:54:22 [name], [name] ([id number]) -------------------------------------------------------------------------------- Physician Orders Details Patient Name: [name], [name] Date of Service: 01/30/2022 11:00 AM Medical Record Number: [id number] Patient Account Number: [id number] Date of Birth/Sex: 15-May-1956 (66 y.o. M) Treating  RN: Carlene Coria Primary Care Provider: Alysia Penna Other Clinician: Referring Provider: Alysia Penna Treating Provider/Extender: Skipper Cliche in Treatment: 25 Verbal / Phone Orders: No Diagnosis Coding Follow-up Appointments o Return Appointment in 1 week. o Nurse Visit as needed [employer] o Wash wounds with antibacterial soap and water. o May shower; gently cleanse wound with antibacterial soap, rinse and pat dry prior to dressing wounds - shower with dressing on and remove after shower o No tub bath. Anesthetic (Use 'Patient Medications' Section for Anesthetic Order Entry) o Lidocaine applied to wound bed Edema Control - Lymphedema / Segmental Compressive Device / Other o Elevate, Exercise Daily and Avoid Standing for Ostrovsky Periods of Time. o Elevate leg(s) parallel to the floor when sitting. o DO YOUR BEST to sleep in the bed at night. DO NOT sleep in your recliner. Emmick hours of sitting in a recliner leads to swelling of the legs and/or potential wounds on your backside. Wound Treatment Wound #1 - Calcaneus Wound Laterality: Left Cleanser: Normal Saline 3 x Per Week/30 Days Discharge Instructions: Wash your hands with soap and water. Remove old dressing, discard into plastic bag and place into trash. Cleanse the wound with Normal Saline prior to applying a clean dressing using gauze sponges, not tissues or cotton balls. Do not scrub or use excessive force. Pat dry using gauze sponges, not tissue or cotton balls. Primary Dressing: Prisma 4.34 (in) (Dispense As Written) 3 x Per Week/30 Days Discharge Instructions: Moisten w/normal saline or sterile water; Cover wound as directed. Do not remove from wound bed. Secondary Dressing: (BORDER) Zetuvit Plus SILICONE BORDER Dressing 5x5 (in/in) (Generic) 3 x Per Week/30 Days Discharge Instructions: Please do not put silicone bordered dressings under wraps. Use non-bordered dressing only. Electronic  Signature(s) Signed: 01/30/2022 4:45:05 PM By: Worthy Keeler PA-C Signed: 01/31/2022 8:41:24 AM By: Carlene Coria RN Entered By: Carlene Coria on 01/30/2022 11:32:02 [name], [name] ([id number]) -------------------------------------------------------------------------------- Problem List Details Patient Name: [name], [name] Date of Service: 01/30/2022 11:00 AM Medical Record Number: [id number] Patient Account Number: [id number] Date of Birth/Sex: 02/27/1956 (66 y.o. 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 ([id number]) -------------------------------------------------------------------------------- Progress Note Details Patient Name: [name], [name] Date of Service: 01/30/2022 11:00 AM Medical Record Number: [id number] Patient Account Number: [id number] Date of Birth/Sex: March 09, 1956 (66 y.o. M) Treating RN: Yevonne Pax Primary Care Provider: [name] Other Clinician: Referring Provider: [name] Treating Provider/Extender: Rowan Blase in Treatment: 25 Subjective Chief Complaint Information obtained from Patient Left heel ulcer History of Present Illness  (HPI) 08/08/2021 upon evaluation today patient appears to be doing somewhat poorly in regard to a heel ulcer that began as a result of the patient tells me him trying to get his foot into his shoes that were somewhat tight. This was around 4 January. He was using a shoehorn fairly aggressively and thinks that he may have caused the bruising at that time. Fortunately there does not appear to be any signs of infection at this point. Nonetheless he does have some definite necrotic tissue. This can need to be cleared away. Nonetheless unfortunately [name] we do not have clearance in my opinion therefore to go forward with an aggressive sharp debridement. Patient does have diabetes mellitus type 2 with the most recent hemoglobin A1c of 5.2 and that was on June 2022. He also has a history of chronic venous insufficiency, peripheral vascular disease, and hypertension. Obviously with regard to the peripheral vascular disease we will continue to send him to vascular to quantify his ABIs and TBI's. 08/22/2021 upon evaluation today patient appears to be doing well with regard to his wound. This is definitely showing some signs of improvement which is great news and overall very pleased with where things stand today. There does not appear to be any evidence of active infection locally nor systemically which is great news and overall I feel like that this is showing signs of improvement. It is definitely much less irritated as compared to last week. I am very pleased in that regard I am good to be able to get a lot of this dry skin off today as well which is great news. 09/05/2021 upon evaluation today patient's heel actually showing signs of some improvement though there is still a bit of eschar noted in the center part of the wound. I do believe this is going require some sharp debridement to clear this away. The Iodosorb just is not quite cutting it. Fortunately there does not appear to be  any signs of active infection at this time locally nor systemically. 09/12/2021 upon evaluation today patient appears to be doing well with regard to his wound. He has [name] tolerating the dressing changes I do believe that clearing off the eschar last week has [name] to help there is still some necrotic tissue removed today overall he seems to be doing quite well however. 09/20/2021 upon evaluation today patient appears to be doing well with regard to his wound he does have some necrotic tissue noted on the surface of the wound but I do feel like we are getting better here definitely making good progress. I do not see any signs of infection which is great news and overall I think that we are on the right track. 09-26-2021 upon evaluation today patient appears to be doing well with regard to his heel ulcer. This is actually measuring smaller and looking better today. Fortunately I do not see any evidence of active infection locally or systemically which is great news. 10-03-2021 upon evaluation today patient appears to be doing well currently in regard to his heel for the most part although  he does have a little bit of what appears to be bruising in the base of the wound. I am not sure exactly where to get the pressure from but this very likely is happening when he is walking he tells me has [name] wearing some other shoes [name] a little bit more on his feet than normal. Fortunately I do not see any evidence of active infection at this time. I have contemplated put him in a different type of shoe or even a total contact cast to be honest. With that being said his balance is not good and I therefore have some concerns in that regard. 10-17-2021 upon evaluation today patient appears to be doing poorly in regard to his heel. Again this is showing signs of significant deep tissue injury which is bringing into question whether or not he may actually have something going on here which is more related to his blood flow.  We have previously done arterial study though to be honest with ABIs right at 1 and noncompressible which tends to make me think this may be falsely elevated coupled with TBI's which were at 0.5 with biphasic waveforms I am not certain he is getting the blood flow needed to completely heal this wound. I discussed with him today that he may need to see vascular in order to discuss options for her further angiogram/evaluation in general. I would at least leave it up to them as to whether they feel the angiogram is necessary or not. Nonetheless I do believe pressure is also playing a component to this he does tell me he started sleeping with his heel on the bed not raised underneath the pillow already this was dark and discolored that may have [name] what triggered this to get worse I am not really sure. 5/8; the wound seems to have less of a deep tissue injury but there is still eschar. Scant amount of purulent drainage some erythema medially extending up the heel. He has neuropathy no pain no systemic illness. Culture that we did last time showed moderate Proteus, moderate Enterococcus and moderate Pseudomonas. The Enterococcus will require ampicillin so he will require 2 antibiotics. He is using Santyl to the wound 10-31-2021 upon evaluation today patient appears to be doing well with regard to his wound all things considered. There is still necrotic tissue noted in the central portion of the wound which is not what we want to see but unfortunately I think this is just could be the case until we ensure he has good blood flow and can subsequently get things moving in a better direction. Fortunately I do not see any evidence of infection locally or systemically which is great news and overall I think that we are on the right track in that regard. I am going to go ahead and refill the Augmentin as well as the Cipro for him today he was given originally 10 days worth I would have her extend that for 7  days. 11-07-2021 upon evaluation today patient appears to be doing better in regard to the overall appearance of his wound and I do feel like the [facility] is doing a good job here. Fortunately there does not appear to be any evidence of active infection locally nor systemically which is great news and overall I am extremely pleased with where things stand today. The patient is happy as well with the progress. Nonetheless I had extended his [name], [name] ([id number]) Augmentin and Cipro for him at the last visit I think  that is going to need to be extended a bit further. That means he is probably can need some probiotics as well. 11-21-2021 upon evaluation today patient appears to be doing a little better in regard to size and is definitely making some progress here. With that being said he also definitely has some areas that are still fairly thick with necrotic tissue. We discussed debridement yet again today and he is amenable to doing some debridement as Spilde as its not too aggressive which I think we can definitely do. I do believe the [facility] is doing a great job and will definitely be continuing that as well. 12-05-2021 upon evaluation today patient appears to be doing well with regard to his heel. He has [name] tolerating the dressing changes without complication. Fortunately there does not appear to be any evidence of active infection locally or systemically at this time. No fevers, chills, nausea, vomiting, or diarrhea. 12-19-2021 upon evaluation today patient appears to be doing excellent in regard to his wound. Has [name] tolerating the Santyl in an excellent fashion. Fortunately I do not see any evidence of infection locally or systemically which is great news and overall I am very pleased with where we stand today. 01-02-2022 upon evaluation today patient appears to be doing excellent in regard to his wound. He has [name] tolerating the dressing changes without complication. Fortunately I do not see  any evidence of active infection there is minimal amount of slough and biofilm buildup which I Minna perform debridement in regard to today but other than this overall I really feel like that the patient is doing excellent and I think he is ready to switch to a collagen based dressing. I think this is going to do well for him. 01-19-2022 upon evaluation today patient appears to be doing much better in regard to his wound. He has [name] tolerating the dressing changes without complication. Fortunately there does not appear to be any evidence of active infection locally or systemically at this time which is great news and overall I am extremely pleased with where we stand. There does not appear to be any evidence of active infection whatsoever which is great news. No fevers, chills, nausea, vomiting, or diarrhea. 01-30-2022 upon evaluation today patient appears to be doing well currently in regard to his wound. This is actually showing signs of excellent improvement I am very pleased based on what we are seeing today. I do not see any evidence of active infection locally or systemically at this time. Objective Constitutional Well-nourished and well-hydrated in no acute distress. Vitals Time Taken: 11:05 AM, Height: 68 in, Weight: 220 lbs, BMI: 33.4, Temperature: 98.2 F, Pulse: 72 bpm, Respiratory Rate: 18 breaths/min, Blood Pressure: 189/74 mmHg. Respiratory normal breathing without difficulty. Psychiatric this patient is able to make decisions and demonstrates good insight into disease process. Alert and Oriented x 3. pleasant and cooperative. General Notes: Patient's wound is actually showing excellent improvement which is great news and overall I am extremely pleased with where we are at this point. There does not appear to be any signs of active infection locally or systemically currently. Integumentary (Hair, Skin) Wound #1 status is Open. Original cause of wound was Pressure Injury. The date  acquired was: 06/19/2021. The wound has [name] in treatment 25 weeks. The wound is located on the Left Calcaneus. The wound measures 2.2cm length x 2.7cm width x 0.2cm depth; 4.665cm^2 area and 0.933cm^3 volume. There is Fat Layer (Subcutaneous Tissue) exposed. There is no tunneling or  undermining noted. There is a medium amount of serosanguineous drainage noted. There is medium (34-66%) red, hyper - granulation within the wound bed. There is a medium (34-66%) amount of necrotic tissue within the wound bed including Adherent Slough. 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 Essential (primary) hypertension Plan Follow-up Appointments: Return Appointment in 1 week. Nurse Visit as needed Bathing/ Shower/ Hygiene: Wash wounds with antibacterial soap and water. May shower; gently cleanse wound with antibacterial soap, rinse and pat dry prior to dressing wounds - shower with dressing on and remove after shower No tub bath. Anesthetic (Use 'Patient Medications' Section for Anesthetic Order Entry): Lidocaine applied to wound bed Edema Control - Lymphedema / Segmental Compressive Device / Other: Elevate, Exercise Daily and Avoid Standing for [place] Periods of Time. Elevate leg(s) parallel to the floor when sitting. DO YOUR BEST to sleep in the bed at night. DO NOT sleep in your recliner. [name] hours of sitting in a recliner leads to swelling of the legs and/or potential wounds on your backside. WOUND #1: - Calcaneus Wound Laterality: Left Cleanser: Normal Saline 3 x Per Week/30 Days Discharge Instructions: Wash your hands with soap and water. Remove old dressing, discard into plastic bag and place into trash. Cleanse the wound  with Normal Saline prior to applying a clean dressing using gauze sponges, not tissues or cotton balls. Do not scrub or use excessive force. Pat dry using gauze sponges, not tissue or cotton balls. Primary Dressing: Prisma 4.34 (in) (Dispense As Written) 3 x Per Week/30 Days Discharge Instructions: Moisten w/normal saline or sterile water; Cover wound as directed. Do not remove from wound bed. Secondary Dressing: (BORDER) Zetuvit Plus SILICONE BORDER Dressing 5x5 (in/in) (Generic) 3 x Per Week/30 Days Discharge Instructions: Please do not put silicone bordered dressings under wraps. Use non-bordered dressing only. 1. I am going to suggest that we go ahead and continue with the wound care measures as before and the patient is in agreement with the plan. This includes the use of the silver collagen that we will cut back on how wet we put this on and see how that we do. 2. I am also can recommend that we have the patient continue with his Zetuvit to cover which he feels is doing much better. 3. I am also can recommend he continue to monitor for any signs of worsening if anything changes he should let me know. We will see patient back for reevaluation in 1 week here in the clinic. If anything worsens or changes patient will contact our office for additional recommendations. Electronic Signature(s) Signed: 01/30/2022 11:54:54 AM By: Worthy Keeler PA-C Entered By: Worthy Keeler on 01/30/2022 11:54:53 Mcphillips, [name] -------------------------------------------------------------------------------- SuperBill Details Patient Name: Ilyas, Tayari W. Date of Service: 01/30/2022 Medical Record Number: [id number] Patient Account Number: [id number] Date of Birth/Sex: Feb 21, 1956 (67 y.o. M) Treating RN: Carlene Coria Primary Care Provider: Alysia Penna Other Clinician: Referring Provider: Alysia Penna Treating Provider/Extender: Skipper Cliche in Treatment: 25 Diagnosis Coding ICD-10 Codes Code  Description E11.621 Type 2 diabetes mellitus with foot ulcer L97.422 Non-pressure chronic ulcer of left heel and midfoot with fat layer exposed M86.672 Other chronic osteomyelitis, left ankle and foot I87.2 Venous insufficiency (chronic) (peripheral) I73.89 Other specified peripheral vascular diseases I10 Essential (primary) hypertension  Facility Procedures CPT4 Code: [id number] Description: [phone] - WOUND CARE VISIT-LEV 2 EST PT Modifier: Quantity: 1 Physician Procedures CPT4 Code: [id number] Description: 99213 - WC PHYS LEVEL 3 - EST PT Modifier: Quantity: 1 CPT4 Code: Description: ICD-10 Diagnosis Description E11.621 Type 2 diabetes mellitus with foot ulcer L97.422 Non-pressure chronic ulcer of left heel and midfoot with fat layer ex S50.539 Other chronic osteomyelitis, left ankle and foot I87.2 Venous insufficiency  (chronic) (peripheral) Modifier: posed Quantity: Electronic Signature(s) Signed: 01/30/2022 11:55:43 AM By: Lenda Kelp PA-C Entered By: Lenda Kelp on 01/30/2022 11:55:42

## 2022-02-02 ENCOUNTER — Telehealth: Payer: Self-pay | Admitting: Family Medicine

## 2022-02-02 NOTE — Telephone Encounter (Signed)
Please advise 

## 2022-02-02 NOTE — Telephone Encounter (Signed)
Please straighten this out with his pharmacy. If additional refills are needed, take care of them

## 2022-02-02 NOTE — Telephone Encounter (Signed)
Pt states it was discussed the prescription for insulin isophane & regular human KwikPen (NOVOLIN 70/30 KWIKPEN) (70-30) 100 UNIT/ML KwikPen would be for 25 units but it was dispensed for 15.please advise patient

## 2022-02-03 ENCOUNTER — Other Ambulatory Visit: Payer: Self-pay

## 2022-02-03 MED ORDER — NOVOLIN 70/30 FLEXPEN (70-30) 100 UNIT/ML ~~LOC~~ SUPN: 25.0000 [IU] | PEN_INJECTOR | Freq: Two times a day (BID) | SUBCUTANEOUS | 3 refills | Status: DC

## 2022-02-03 NOTE — Telephone Encounter (Signed)
Pt is  calling checking on the below info

## 2022-02-03 NOTE — Telephone Encounter (Signed)
New Rx was sent pt pharmacy with the new dosage, send pt a MyChart message

## 2022-02-13 ENCOUNTER — Other Ambulatory Visit: Payer: Self-pay | Admitting: Family Medicine

## 2022-02-13 ENCOUNTER — Encounter: Payer: No Typology Code available for payment source | Admitting: Physician Assistant

## 2022-02-13 DIAGNOSIS — E11621 Type 2 diabetes mellitus with foot ulcer: Secondary | ICD-10-CM | POA: Diagnosis not present

## 2022-02-13 NOTE — Progress Notes (Addendum)
Arias, Quinlin Lacretia Nicks ([id number]) Visit Report for 02/13/2022 Chief Complaint Document Details Patient Name: [name], [name] Date of Service: 02/13/2022 8:15 AM Medical Record Number: [id number] Patient Account Number: [id number] Date of Birth/Sex: 1955-11-03 (66 y.o. M) Treating RN: Yevonne Pax Primary Care Provider: [name] Other Clinician: Betha Loa Referring Provider: [name] Treating Provider/Extender: Rowan Blase in Treatment: 27 Information Obtained from: Patient Chief Complaint Left heel ulcer Electronic Signature(s) Signed: 02/13/2022 8:22:22 AM By: Lenda Kelp PA-C Entered By: Lenda Kelp on 02/13/2022 08:22:22 [name], [name] ([id number]) -------------------------------------------------------------------------------- HPI Details Patient Name: [name], [name] Date of Service: 02/13/2022 8:15 AM Medical Record Number: [id number] Patient Account Number: [id number] Date of Birth/Sex: 1955/12/02 (66 y.o. M) Treating RN: Yevonne Pax Primary Care Provider: [name] Other Clinician: Betha Loa Referring Provider: [name] Treating Provider/Extender: Rowan Blase in Treatment: 27 History of Present Illness HPI Description: 08/08/2021 upon evaluation today patient appears to be doing somewhat poorly in regard to a heel ulcer that began as a result of the patient tells me him trying to get his foot into his shoes that were somewhat tight. This was around 4 January. He was using a shoehorn fairly aggressively and thinks that he may have caused the bruising at that time. Fortunately there does not appear to be any signs of infection at this point. Nonetheless he does have some definite necrotic tissue. This can need to be cleared away. Nonetheless unfortunately [name] we do not have clearance in my opinion therefore to go forward with an [name] sharp debridement. Patient does have diabetes mellitus type 2 with the most recent  hemoglobin A1c of 5.2 and that was on June 2022. He also has a history of chronic venous insufficiency, peripheral vascular disease, and hypertension. Obviously with regard to the peripheral vascular disease we will continue to send him to vascular to quantify his ABIs and TBI's. 08/22/2021 upon evaluation today patient appears to be doing well with regard to his wound. This is definitely showing some signs of improvement which is great news and overall very pleased with where things stand today. There does not appear to be any evidence of active infection locally nor systemically which is great news and overall I feel like that this is showing signs of improvement. It is definitely much less irritated as compared to last week. I am very pleased in that regard I am good to be able to get a lot of this dry skin off today as well which is great news. 09/05/2021 upon evaluation today patient's heel actually showing signs of some improvement though there is still a bit of eschar noted in the center part of the wound. I do believe this is going require some sharp debridement to clear this away. The Iodosorb just is not quite cutting it. Fortunately there does not appear to be any signs of active infection at this time locally nor systemically. 09/12/2021 upon evaluation today patient appears to be doing well with regard to his wound. He has been tolerating the dressing changes I do believe that clearing off the eschar last week has been to help there is still some necrotic tissue removed today overall he seems to be doing quite well however. 09/20/2021 upon evaluation today patient appears to be doing well with regard to his wound he does have some necrotic tissue noted on the surface of the wound but I do feel like we are getting better here definitely making good progress. I do  not see any signs of infection which is great news and overall I think that we are on the right track. 09-26-2021 upon evaluation  today patient appears to be doing well with regard to his heel ulcer. This is actually measuring smaller and looking better today. Fortunately I do not see any evidence of active infection locally or systemically which is great news. 10-03-2021 upon evaluation today patient appears to be doing well currently in regard to his heel for the most part although he does have a little bit of what appears to be bruising in the base of the wound. I am not sure exactly where to get the pressure from but this very likely is happening when he is walking he tells me has been wearing some other shoes been a little bit more on his feet than normal. Fortunately I do not see any evidence of active infection at this time. I have contemplated put him in a different type of shoe or even a total contact cast to be honest. With that being said his balance is not good and I therefore have some concerns in that regard. 10-17-2021 upon evaluation today patient appears to be doing poorly in regard to his heel. Again this is showing signs of significant deep tissue injury which is bringing into question whether or not he may actually have something going on here which is more related to his blood flow. We have previously done arterial study though to be honest with ABIs right at 1 and noncompressible which tends to make me think this may be falsely elevated coupled with TBI's which were at 0.5 with biphasic waveforms I am not certain he is getting the blood flow needed to completely heal this wound. I discussed with him today that he may need to see vascular in order to discuss options for her further angiogram/evaluation in general. I would at least leave it up to them as to whether they feel the angiogram is necessary or not. Nonetheless I do believe pressure is also playing a component to this he does tell me he started sleeping with his heel on the bed not raised underneath the pillow already this was dark and discolored that  may have been what triggered this to get worse I am not really sure. 5/8; the wound seems to have less of a deep tissue injury but there is still eschar. Scant amount of purulent drainage some erythema medially extending up the heel. He has neuropathy no pain no systemic illness. Culture that we did last time showed moderate Proteus, moderate Enterococcus and moderate Pseudomonas. The Enterococcus will require ampicillin so he will require 2 antibiotics. He is using Santyl to the wound 10-31-2021 upon evaluation today patient appears to be doing well with regard to his wound all things considered. There is still necrotic tissue noted in the central portion of the wound which is not what we want to see but unfortunately I think this is just could be the case until we ensure he has good blood flow and can subsequently get things moving in a better direction. Fortunately I do not see any evidence of infection locally or systemically which is great news and overall I think that we are on the right track in that regard. I am going to go ahead and refill the Augmentin as well as the Cipro for him today he was given originally 10 days worth I would have her extend that for 7 days. 11-07-2021 upon evaluation today patient appears to  be doing better in regard to the overall appearance of his wound and I do feel like the Melburn PopperSantyl is doing a good job here. Fortunately there does not appear to be any evidence of active infection locally nor systemically which is great news and overall I am extremely pleased with where things stand today. The patient is happy as well with the progress. Nonetheless I had extended his Augmentin and Cipro for him at the last visit I think that is going to need to be extended a bit further. That means he is probably can need some probiotics as well. 11-21-2021 upon evaluation today patient appears to be doing a little better in regard to size and is definitely making some progress here.  With that being said he also definitely has some areas that are still fairly thick with necrotic tissue. We discussed debridement yet again today and he is Harper, Alvan W. ([id number]) amenable to doing some debridement as Balling as its not too [name] which I think we can definitely do. I do believe the Melburn PopperSantyl is doing a great job and will definitely be continuing that as well. 12-05-2021 upon evaluation today patient appears to be doing well with regard to his heel. He has been tolerating the dressing changes without complication. Fortunately there does not appear to be any evidence of active infection locally or systemically at this time. No fevers, chills, nausea, vomiting, or diarrhea. 12-19-2021 upon evaluation today patient appears to be doing excellent in regard to his wound. Has been tolerating the Santyl in an excellent fashion. Fortunately I do not see any evidence of infection locally or systemically which is great news and overall I am very pleased with where we stand today. 01-02-2022 upon evaluation today patient appears to be doing excellent in regard to his wound. He has been tolerating the dressing changes without complication. Fortunately I do not see any evidence of active infection there is minimal amount of slough and biofilm buildup which I Minna perform debridement in regard to today but other than this overall I really feel like that the patient is doing excellent and I think he is ready to switch to a collagen based dressing. I think this is going to do well for him. 01-19-2022 upon evaluation today patient appears to be doing much better in regard to his wound. He has been tolerating the dressing changes without complication. Fortunately there does not appear to be any evidence of active infection locally or systemically at this time which is great news and overall I am extremely pleased with where we stand. There does not appear to be any evidence of active infection whatsoever  which is great news. No fevers, chills, nausea, vomiting, or diarrhea. 01-30-2022 upon evaluation today patient appears to be doing well currently in regard to his wound. This is actually showing signs of excellent improvement I am very pleased based on what we are seeing today. I do not see any evidence of active infection locally or systemically at this time. 02-13-2022 upon evaluation today patient appears to be doing excellent in regard to his wound. This is showing signs of a little bit of maceration otherwise it seems to be a little bit smaller which is great news. Overall very pleased with where things stand at this point. Electronic Signature(s) Signed: 02/13/2022 9:25:02 AM By: Lenda KelpStone III, Gunther Zawadzki PA-C Entered By: Lenda KelpStone III, Cleone Hulick on 02/13/2022 09:25:02 [name], [name] ([id number]) -------------------------------------------------------------------------------- Physical Exam Details Patient Name: [name], [name] Date of Service: 02/13/2022 8:15  AM Medical Record Number: [id number] Patient Account Number: [id number] Date of Birth/Sex: Aug 11, 1955 (66 y.o. M) Treating RN: Yevonne Pax Primary Care Provider: [name] Other Clinician: Betha Loa Referring Provider: [name] Treating Provider/Extender: Rowan Blase in Treatment: 78 Constitutional Well-nourished and well-hydrated in no acute distress. Respiratory normal breathing without difficulty. Psychiatric this patient is able to make decisions and demonstrates good insight into disease process. Alert and Oriented x 3. pleasant and cooperative. Notes Patient's wound bed did not require any sharp debridement today which is great news and overall I am extremely pleased with where we stand currently. Electronic Signature(s) Signed: 02/13/2022 9:25:36 AM By: Lenda Kelp PA-C Entered By: Lenda Kelp on 02/13/2022 09:25:35 [name], [name]  ([id number]) -------------------------------------------------------------------------------- Physician Orders Details Patient Name: [name], [name] Date of Service: 02/13/2022 8:15 AM Medical Record Number: [id number] Patient Account Number: [id number] Date of Birth/Sex: 03/31/1956 (66 y.o. 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 o Nurse Visit as needed [facility] o Wash wounds with antibacterial soap and water. o May shower; gently cleanse wound with antibacterial soap, rinse and pat dry prior to dressing wounds - shower with dressing on and remove after shower o No tub bath. Anesthetic (Use 'Patient Medications' Section for Anesthetic Order Entry) o Lidocaine applied to wound bed Edema Control - Lymphedema / Segmental Compressive Device / Other o Elevate, Exercise Daily and Avoid Standing for [name] Periods of Time. o Elevate leg(s) parallel to the floor when sitting. o DO YOUR BEST to sleep in the bed at night. DO NOT sleep in your recliner. [name] hours of sitting in a recliner leads to swelling of the legs and/or potential wounds on your backside. Wound Treatment Wound #1 - Calcaneus Wound Laterality: Left Cleanser: Normal Saline 3 x Per Week/30 Days Discharge Instructions: Wash your hands with soap and water. Remove old dressing, discard into  plastic bag and place into trash. Cleanse the wound with Normal Saline prior to applying a clean dressing using gauze sponges, not tissues or cotton balls. Do not scrub or use excessive force. Pat dry using gauze sponges, not tissue or cotton balls. Primary Dressing: Prisma 4.34 (in) (Dispense As Written) 3 x Per Week/30 Days Discharge Instructions: Moisten w/normal saline or sterile water; Cover wound as directed. Do not remove from wound bed. Secondary Dressing: Drawtex Hydroconductive Wound Dressing,o2x2 (in/in) 3 x Per Week/30 Days Discharge Instructions: Apply to wound with drainage. Secondary Dressing: (BORDER) Zetuvit Plus SILICONE BORDER Dressing 5x5 (in/in) (DME) (Generic) 3 x Per Week/30 Days Discharge Instructions: Please do not put silicone bordered dressings under wraps. Use non-bordered dressing only. Electronic Signature(s) Signed: 02/13/2022 5:10:30 PM By: Betha Loa Signed: 02/13/2022 5:17:38 PM By: Lenda Kelp PA-C Entered By: Betha Loa on 02/13/2022 08:55:03 [name], [name] -------------------------------------------------------------------------------- Problem List Details Patient Name: [name], [name] Date of Service: 02/13/2022 8:15 AM Medical Record Number: [id number] Patient Account Number: [id number] Date of Birth/Sex: 02-13-1956 (66 y.o. 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 ([id number]) -------------------------------------------------------------------------------- Progress Note Details Patient Name: [name], [name] Date of Service: 02/13/2022 8:15 AM Medical Record Number: [id number] Patient Account Number: [id number] Date of Birth/Sex: 08-10-1955 (66 y.o. M) Treating RN: Yevonne Pax Primary Care Provider: [name] Other Clinician: Betha Loa Referring Provider: [name] Treating Provider/Extender: Rowan Blase in Treatment: 27 Subjective Chief Complaint Information obtained from Patient Left heel ulcer History of Present Illness (HPI) 08/08/2021 upon evaluation today patient appears to be doing somewhat poorly in regard to a heel ulcer that began as a result of the patient tells me him trying to get his foot into his shoes that were somewhat tight. This was around 4 January. He was using a shoehorn fairly aggressively and thinks that he may have caused the bruising at that time. Fortunately there does not appear to be any signs of infection at this point. Nonetheless he does have some definite necrotic tissue. This can need to be cleared away. Nonetheless unfortunately [name] we do not have clearance in my opinion therefore to go forward with an [name] sharp debridement. Patient does have diabetes mellitus type 2 with the most recent hemoglobin A1c of 5.2 and that was on June 2022. He also has a history of chronic venous insufficiency, peripheral vascular disease, and hypertension. Obviously with regard to the peripheral vascular disease we will continue to send him to vascular to quantify his ABIs and TBI's. 08/22/2021 upon evaluation today patient  appears to be doing well with regard to his wound. This is definitely showing some signs of improvement which is great news and overall very pleased with where things stand today. There does not appear to be any evidence of active infection locally nor systemically which is great news and overall I feel like that this is showing signs of improvement. It is definitely much less irritated as compared to last week. I am very pleased in that regard I am good to be able to get a lot of this dry skin off today as well which is great news. 09/05/2021 upon evaluation today patient's heel actually showing signs of some improvement though there is still a bit of eschar noted in the center part of the wound. I do believe this is going require some sharp debridement to clear this away. The Iodosorb just is not quite cutting it. Fortunately there does not appear to be any signs of active infection at this time locally nor systemically. 09/12/2021 upon evaluation today patient appears to be doing well with regard to his wound. He has been tolerating the dressing changes I do believe that clearing off the eschar last week has been to help there is still some necrotic tissue removed today overall he seems to be doing quite well however. 09/20/2021 upon evaluation today patient appears to be doing well with regard to his wound  he does have some necrotic tissue noted on the surface of the wound but I do feel like we are getting better here definitely making good progress. I do not see any signs of infection which is great news and overall I think that we are on the right track. 09-26-2021 upon evaluation today patient appears to be doing well with regard to his heel ulcer. This is actually measuring smaller and looking better today. Fortunately I do not see any evidence of active infection locally or systemically which is great news. 10-03-2021 upon evaluation today patient appears to be doing well currently in regard to his  heel for the most part although he does have a little bit of what appears to be bruising in the base of the wound. I am not sure exactly where to get the pressure from but this very likely is happening when he is walking he tells me has been wearing some other shoes been a little bit more on his feet than normal. Fortunately I do not see any evidence of active infection at this time. I have contemplated put him in a different type of shoe or even a total contact cast to be honest. With that being said his balance is not good and I therefore have some concerns in that regard. 10-17-2021 upon evaluation today patient appears to be doing poorly in regard to his heel. Again this is showing signs of significant deep tissue injury which is bringing into question whether or not he may actually have something going on here which is more related to his blood flow. We have previously done arterial study though to be honest with ABIs right at 1 and noncompressible which tends to make me think this may be falsely elevated coupled with TBI's which were at 0.5 with biphasic waveforms I am not certain he is getting the blood flow needed to completely heal this wound. I discussed with him today that he may need to see vascular in order to discuss options for her further angiogram/evaluation in general. I would at least leave it up to them as to whether they feel the angiogram is necessary or not. Nonetheless I do believe pressure is also playing a component to this he does tell me he started sleeping with his heel on the bed not raised underneath the pillow already this was dark and discolored that may have been what triggered this to get worse I am not really sure. 5/8; the wound seems to have less of a deep tissue injury but there is still eschar. Scant amount of purulent drainage some erythema medially extending up the heel. He has neuropathy no pain no systemic illness. Culture that we did last time showed moderate  Proteus, moderate Enterococcus and moderate Pseudomonas. The Enterococcus will require ampicillin so he will require 2 antibiotics. He is using Santyl to the wound 10-31-2021 upon evaluation today patient appears to be doing well with regard to his wound all things considered. There is still necrotic tissue noted in the central portion of the wound which is not what we want to see but unfortunately I think this is just could be the case until we ensure he has good blood flow and can subsequently get things moving in a better direction. Fortunately I do not see any evidence of infection locally or systemically which is great news and overall I think that we are on the right track in that regard. I am going to go ahead and refill the Augmentin as well  as the Cipro for him today he was given originally 10 days worth I would have her extend that for 7 days. 11-07-2021 upon evaluation today patient appears to be doing better in regard to the overall appearance of his wound and I do feel like the Melburn Popper is doing a good job here. Fortunately there does not appear to be any evidence of active infection locally nor systemically which is great news and overall I am extremely pleased with where things stand today. The patient is happy as well with the progress. Nonetheless I had extended his [name], Connar W. ([id number]) Augmentin and Cipro for him at the last visit I think that is going to need to be extended a bit further. That means he is probably can need some probiotics as well. 11-21-2021 upon evaluation today patient appears to be doing a little better in regard to size and is definitely making some progress here. With that being said he also definitely has some areas that are still fairly thick with necrotic tissue. We discussed debridement yet again today and he is amenable to doing some debridement as [name] which I think we can definitely do. I do believe the Melburn Popper is doing a  great job and will definitely be continuing that as well. 12-05-2021 upon evaluation today patient appears to be doing well with regard to his heel. He has been tolerating the dressing changes without complication. Fortunately there does not appear to be any evidence of active infection locally or systemically at this time. No fevers, chills, nausea, vomiting, or diarrhea. 12-19-2021 upon evaluation today patient appears to be doing excellent in regard to his wound. Has been tolerating the Santyl in an excellent fashion. Fortunately I do not see any evidence of infection locally or systemically which is great news and overall I am very pleased with where we stand today. 01-02-2022 upon evaluation today patient appears to be doing excellent in regard to his wound. He has been tolerating the dressing changes without complication. Fortunately I do not see any evidence of active infection there is minimal amount of slough and biofilm buildup which I Minna perform debridement in regard to today but other than this overall I really feel like that the patient is doing excellent and I think he is ready to switch to a collagen based dressing. I think this is going to do well for him. 01-19-2022 upon evaluation today patient appears to be doing much better in regard to his wound. He has been tolerating the dressing changes without complication. Fortunately there does not appear to be any evidence of active infection locally or systemically at this time which is great news and overall I am extremely pleased with where we stand. There does not appear to be any evidence of active infection whatsoever which is great news. No fevers, chills, nausea, vomiting, or diarrhea. 01-30-2022 upon evaluation today patient appears to be doing well currently in regard to his wound. This is actually showing signs of excellent improvement I am very pleased based on what we are seeing today. I do not see any evidence of active  infection locally or systemically at this time. 02-13-2022 upon evaluation today patient appears to be doing excellent in regard to his wound. This is showing signs of a little bit of maceration otherwise it seems to be a little bit smaller which is great news. Overall very pleased with where things stand at this point. Objective Constitutional Well-nourished and well-hydrated in no acute  distress. Vitals Time Taken: 8:26 AM, Height: 68 in, Weight: 220 lbs, BMI: 33.4, Temperature: 98.0 F, Pulse: 74 bpm, Respiratory Rate: 18 breaths/min, Blood Pressure: 166/80 mmHg. Respiratory normal breathing without difficulty. Psychiatric this patient is able to make decisions and demonstrates good insight into disease process. Alert and Oriented x 3. pleasant and cooperative. General Notes: Patient's wound bed did not require any sharp debridement today which is great news and overall I am extremely pleased with where we stand currently. Integumentary (Hair, Skin) Wound #1 status is Open. Original cause of wound was Pressure Injury. The date acquired was: 06/19/2021. The wound has been in treatment 27 weeks. The wound is located on the Left Calcaneus. The wound measures 2cm length x 2.7cm width x 0.2cm depth; 4.241cm^2 area and 0.848cm^3 volume. There is Fat Layer (Subcutaneous Tissue) exposed. There is no tunneling or undermining noted. There is a medium amount of serosanguineous drainage noted. There is medium (34-66%) red, hyper - granulation within the wound bed. There is a medium (34-66%) amount of necrotic tissue within the wound bed including Adherent Slough. 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 ([id number]) Other chronic osteomyelitis, left ankle and foot Venous insufficiency (chronic) (peripheral) Other specified peripheral vascular diseases Essential (primary)  hypertension Plan Follow-up Appointments: Return Appointment in 1 week. Nurse Visit as needed Bathing/ Shower/ Hygiene: Wash wounds with antibacterial soap and water. May shower; gently cleanse wound with antibacterial soap, rinse and pat dry prior to dressing wounds - shower with dressing on and remove after shower No tub bath. Anesthetic (Use 'Patient Medications' Section for Anesthetic Order Entry): Lidocaine applied to wound bed Edema Control - Lymphedema / Segmental Compressive Device / Other: Elevate, Exercise Daily and Avoid Standing for Beel Periods of Time. Elevate leg(s) parallel to the floor when sitting. DO YOUR BEST to sleep in the bed at night. DO NOT sleep in your recliner. Lagoy hours of sitting in a recliner leads to swelling of the legs and/or potential wounds on your backside. WOUND #1: - Calcaneus Wound Laterality: Left Cleanser: Normal Saline 3 x Per Week/30 Days Discharge Instructions: Wash your hands with soap and water. Remove old dressing, discard into plastic bag and place into trash. Cleanse the wound with Normal Saline prior to applying a clean dressing using gauze sponges, not tissues or cotton balls. Do not scrub or use excessive force. Pat dry using gauze sponges, not tissue or cotton balls. Primary Dressing: Prisma 4.34 (in) (Dispense As Written) 3 x Per Week/30 Days Discharge Instructions: Moisten w/normal saline or sterile water; Cover wound as directed. Do not remove from wound bed. Secondary Dressing: Drawtex Hydroconductive Wound Dressing, 2x2 (in/in) 3 x Per Week/30 Days Discharge Instructions: Apply to wound with drainage. Secondary Dressing: (BORDER) Zetuvit Plus SILICONE BORDER Dressing 5x5 (in/in) (DME) (Generic) 3 x Per Week/30 Days Discharge Instructions: Please do not put silicone bordered dressings under wraps. Use non-bordered dressing only. 1. I would recommend that we go ahead and continue with the wound care measures as before and the  patient is in agreement with plan. This includes the use of the silver collagen which I think is doing a good job. 2. Get a go ahead and utilize as well drawtex over top of this to try to help with controlling more of the drainage I think this should definitely be beneficial for the patient. 3. Also can recommend a bordered foam dressing  to cover. We will see patient back for reevaluation in 2 weeks here in the clinic. If anything worsens or changes patient will contact our office for additional recommendations. Electronic Signature(s) Signed: 02/13/2022 9:38:03 AM By: Lenda Kelp PA-C Entered By: Lenda Kelp on 02/13/2022 09:38:03 Lansberry, Laronn W. ([id number]) -------------------------------------------------------------------------------- SuperBill Details Patient Name: Mcswain, Kaymon W. Date of Service: 02/13/2022 Medical Record Number: [id number] Patient Account Number: [id number] Date of Birth/Sex: Aug 06, 1955 (66 y.o. M) Treating RN: Yevonne Pax Primary Care Provider: [name] Other Clinician: Betha Loa Referring Provider: [name] Treating Provider/Extender: Rowan Blase in Treatment: 27 Diagnosis Coding ICD-10 Codes Code Description E11.621 Type 2 diabetes mellitus with foot ulcer L97.422 Non-pressure chronic ulcer of left heel and midfoot with fat layer exposed M86.672 Other chronic osteomyelitis, left ankle and foot I87.2 Venous insufficiency (chronic) (peripheral) I73.89 Other specified peripheral vascular diseases I10 Essential (primary) hypertension Facility Procedures CPT4 Code: 19622297 Description: 99213 - WOUND CARE VISIT-LEV 3 EST PT Modifier: Quantity: 1 Physician Procedures CPT4 Code: 9892119 Description: 99213 - WC PHYS LEVEL 3 - EST PT Modifier: Quantity: 1 CPT4 Code: Description: ICD-10 Diagnosis Description E11.621 Type 2 diabetes mellitus with foot ulcer L97.422 Non-pressure chronic ulcer of left heel and midfoot with fat layer ex  E17.408 Other chronic osteomyelitis, left ankle and foot I87.2 Venous insufficiency  (chronic) (peripheral) Modifier: posed Quantity: Electronic Signature(s) Signed: 02/13/2022 9:44:12 AM By: Lenda Kelp PA-C Entered By: Lenda Kelp on 02/13/2022 09:44:11

## 2022-02-13 NOTE — Progress Notes (Addendum)
Salway, Erroll Lacretia Nicks ([id number]) Visit Report for 02/13/2022 Arrival Information Details Patient Name: Nishi, Merril W. Date of Service: 02/13/2022 8:15 AM Medical Record Number: [id number] Patient Account Number: [id number] Date of Birth/Sex: Sep 17, 1955 (66 y.o. M) Treating RN: Yevonne Pax Primary Care Chimere Klingensmith: [name] Other Clinician: Betha Loa Referring Meleana Commerford: [name] Treating Baker Moronta/Extender: Rowan Blase in Treatment: 27 Visit Information History Since Last Visit All ordered tests and consults were completed: No Patient Arrived: [name] Added or deleted any medications: No Arrival Time: 08:22 Any new allergies or adverse reactions: No Transfer Assistance: None Had a fall or experienced change in No Patient Requires Transmission-Based No activities of daily living that may affect Precautions: risk of falls: Patient Has Alerts: Yes Signs or symptoms of abuse/neglect since last visito No Patient Alerts: ABI R >1.0 [place] TBI Implantable device outside of the clinic excluding No 0.50 cellular tissue based products placed in the center ABI L 0.98 since last visit: Pain Present Now: No Electronic Signature(s) Signed: 02/13/2022 5:10:30 PM By: Betha Loa Entered By: Betha Loa on 02/13/2022 08:26:01 Ficken, Knight W. ([id number]) -------------------------------------------------------------------------------- Clinic Level of Care Assessment Details Patient Name: Hindle, Jettie W. Date of Service: 02/13/2022 8:15 AM Medical Record Number: [id number] Patient Account Number: [id number] Date of Birth/Sex: [birth year] (66 y.o. M) Treating RN: Yevonne Pax Primary Care Beth Goodlin: [name] Other Clinician: Betha Loa Referring Aaryn Parrilla: [name] Treating Odeth Bry/Extender: Rowan Blase in Treatment: 27 Clinic Level of Care Assessment Items TOOL 4 Quantity Score []  - Use when only an EandM is performed on FOLLOW-UP visit 0 ASSESSMENTS - Nursing Assessment  / Reassessment X - Reassessment of Co-morbidities (includes updates in patient status) 1 10 X- 1 5 Reassessment of Adherence to Treatment Plan ASSESSMENTS - Wound and Skin Assessment / Reassessment X - Simple Wound Assessment / Reassessment - one wound 1 5 []  - 0 Complex Wound Assessment / Reassessment - multiple wounds []  - 0 Dermatologic / Skin Assessment (not related to wound area) ASSESSMENTS - Focused Assessment []  - Circumferential Edema Measurements - multi extremities 0 []  - 0 Nutritional Assessment / Counseling / Intervention []  - 0 Lower Extremity Assessment (monofilament, tuning fork, pulses) []  - 0 Peripheral Arterial Disease Assessment (using hand held doppler) ASSESSMENTS - Ostomy and/or Continence Assessment and Care []  - Incontinence Assessment and Management 0 []  - 0 Ostomy Care Assessment and Management (repouching, etc.) PROCESS - Coordination of Care X - Simple Patient / Family Education for ongoing care 1 15 []  - 0 Complex (extensive) Patient / Family Education for ongoing care []  - 0 Staff obtains , Records, Test Results / Process Orders []  - 0 Staff telephones HHA, Nursing Homes / Clarify orders / etc []  - 0 Routine Transfer to another Facility (non-emergent condition) []  - 0 Routine Hospital Admission (non-emergent condition) []  - 0 New Admissions / / Ordering NPWT, Apligraf, etc. []  - 0 Emergency Hospital Admission (emergent condition) X- 1 10 Simple Discharge Coordination []  - 0 Complex (extensive) Discharge Coordination PROCESS - Special Needs []  - Pediatric / Minor Patient Management 0 []  - 0 Isolation Patient Management []  - 0 Hearing / Language / Visual special needs []  - 0 Assessment of Community assistance (transportation, D/C planning, etc.) []  - 0 Additional assistance / Altered mentation []  - 0 Support Surface(s) Assessment (bed, cushion, seat, etc.) INTERVENTIONS - Wound Cleansing /  Measurement Nottingham, Loic W. ( ) X- 1 5 Simple Wound Cleansing - one wound []  - 0 Complex Wound Cleansing - multiple wounds  X- 1 5 Wound Imaging (photographs - any number of wounds) []  - 0 Wound Tracing (instead of photographs) X- 1 5 Simple Wound Measurement - one wound []  - 0 Complex Wound Measurement - multiple wounds INTERVENTIONS - Wound Dressings []  - Small Wound Dressing one or multiple wounds 0 X- 1 15 Medium Wound Dressing one or multiple wounds []  - 0 Large Wound Dressing one or multiple wounds []  - 0 Application of Medications - topical []  - 0 Application of Medications - injection INTERVENTIONS - Miscellaneous []  - External ear exam 0 []  - 0 Specimen Collection (cultures, biopsies, blood, body fluids, etc.) []  - 0 Specimen(s) / Culture(s) sent or taken to Lab for analysis []  - 0 Patient Transfer (multiple staff / Civil Service fast streamer / Similar devices) []  - 0 Simple Staple / Suture removal (25 or less) []  - 0 Complex Staple / Suture removal (26 or more) []  - 0 Hypo / Hyperglycemic Management (close monitor of Blood Glucose) []  - 0 Ankle / Brachial Index (ABI) - do not check if billed separately X- 1 5 Vital Signs Has the patient been seen at the hospital within the last three years: Yes Total Score: 80 Level Of Care: New/Established - Level 3 Electronic Signature(s) Signed: 02/13/2022 5:10:30 PM By: Massie Kluver Entered By: Massie Kluver on 02/13/2022 08:47:46 Deleonardis, Raihan W. ([id number]) -------------------------------------------------------------------------------- Encounter Discharge Information Details Patient Name: Bulson, Yotam W. Date of Service: 02/13/2022 8:15 AM Medical Record Number: [id number] Patient Account Number: [id number] Date of Birth/Sex: 07/17/1955 (66 y.o. M) Treating RN: Carlene Coria Primary Care Tanner Vigna: Alysia Penna Other Clinician: Massie Kluver Referring Camber Ninh: Alysia Penna Treating Camellia Popescu/Extender: Skipper Cliche in  Treatment: 27 Encounter Discharge Information Items Discharge Condition: Stable Ambulatory Status: Cane Discharge Destination: Home Transportation: Private Auto Accompanied By: self Schedule Follow-up Appointment: Yes Clinical Summary of Care: Electronic Signature(s) Signed: 02/13/2022 5:10:30 PM By: Massie Kluver Entered By: Massie Kluver on 02/13/2022 08:55:37 Galicia, Cormick W. ([id number]) -------------------------------------------------------------------------------- Lower Extremity Assessment Details Patient Name: Scheerer, Ernesto W. Date of Service: 02/13/2022 8:15 AM Medical Record Number: [id number] Patient Account Number: [id number] Date of Birth/Sex: 20-Jun-1955 (66 y.o. M) Treating RN: Carlene Coria Primary Care Claudina Oliphant: Alysia Penna Other Clinician: Massie Kluver Referring Tamla Winkels: Alysia Penna Treating Harley Fitzwater/Extender: Jeri Cos Weeks in Treatment: 27 Edema Assessment Assessed: Shirlyn Goltz: Yes] [Right: No] Edema: [Left: Ye] [Right: s] Calf Left: Right: Point of Measurement: 30 cm From Medial Instep 37 cm Ankle Left: Right: Point of Measurement: 10 cm From Medial Instep 24.8 cm Vascular Assessment Pulses: Dorsalis Pedis Palpable: [Left:Yes] Posterior Tibial Palpable: [Left:Yes] Electronic Signature(s) Signed: 02/13/2022 4:28:34 PM By: Carlene Coria RN Signed: 02/13/2022 5:10:30 PM By: Massie Kluver Entered By: Massie Kluver on 02/13/2022 08:37:05 Slovacek, Aram W. ([id number]) -------------------------------------------------------------------------------- Multi Wound Chart Details Patient Name: Feldt, Jerad W. Date of Service: 02/13/2022 8:15 AM Medical Record Number: [id number] Patient Account Number: [id number] Date of Birth/Sex: 1955-10-16 (66 y.o. M) Treating RN: Carlene Coria Primary Care Deyra Perdomo: Alysia Penna Other Clinician: Massie Kluver Referring Uilani Sanville: Alysia Penna Treating Jacelyn Cuen/Extender: Skipper Cliche in Treatment: 27 Vital Signs Height(in):  31 Pulse(bpm): 1 Weight(lbs): 220 Blood Pressure(mmHg): 166/80 Body Mass Index(BMI): 33.4 Temperature(F): 98.0 Respiratory Rate(breaths/min): 18 Photos: [N/A:N/A] Wound Location: Left Calcaneus N/A N/A Wounding Event: Pressure Injury N/A N/A Primary Etiology: Diabetic Wound/Ulcer of the Lower N/A N/A Extremity Comorbid History: Cataracts, Hypertension, Type II N/A N/A Diabetes, Neuropathy Date Acquired: 06/19/2021 N/A N/A Weeks of Treatment: 27 N/A N/A Wound Status: Open N/A N/A Wound Recurrence: No N/A N/A  Measurements L x W x D (cm) 2x2.7x0.2 N/A N/A Area (cm) : 4.241 N/A N/A Volume (cm) : 0.848 N/A N/A % Reduction in Area: -86.80% N/A N/A % Reduction in Volume: -273.60% N/A N/A Classification: Grade 2 N/A N/A Exudate Amount: Medium N/A N/A Exudate Type: Serosanguineous N/A N/A Exudate Color: red, brown N/A N/A Granulation Amount: Medium (34-66%) N/A N/A Granulation Quality: Red, Hyper-granulation N/A N/A Necrotic Amount: Medium (34-66%) N/A N/A Exposed Structures: Fat Layer (Subcutaneous Tissue): N/A N/A Yes Epithelialization: Small (1-33%) N/A N/A Treatment Notes Electronic Signature(s) Signed: 02/13/2022 5:10:30 PM By: Betha LoaVenable, Angie Entered By: Betha LoaVenable, Angie on 02/13/2022 08:44:19 Cuff, Maury W. ([id number]) -------------------------------------------------------------------------------- Multi-Disciplinary Care Plan Details Patient Name: Chavis, Samiel W. Date of Service: 02/13/2022 8:15 AM Medical Record Number: [id number] Patient Account Number: [id number] Date of Birth/Sex: 10/14/1955 (66 y.o. M) Treating RN: Yevonne PaxEpps, Carrie Primary Care Ingeborg Fite: Gershon CraneFry, Stephen Other Clinician: Betha LoaVenable, Angie Referring Japleen Tornow: Gershon CraneFry, Stephen Treating Bryson Palen/Extender: Rowan BlaseStone, Hoyt Weeks in Treatment: 27 Active Inactive Wound/Skin Impairment Nursing Diagnoses: Impaired tissue integrity Knowledge deficit related to ulceration/compromised skin integrity Goals: Ulcer/skin  breakdown will have a volume reduction of 30% by week 4 Date Initiated: 08/08/2021 Date Inactivated: 12/19/2021 Target Resolution Date: 09/05/2021 Goal Status: Unmet Unmet Reason: comorbities Ulcer/skin breakdown will have a volume reduction of 50% by week 8 Date Initiated: 08/08/2021 Date Inactivated: 12/19/2021 Target Resolution Date: 10/03/2021 Goal Status: Unmet Unmet Reason: comorbities Ulcer/skin breakdown will have a volume reduction of 80% by week 12 Date Initiated: 08/08/2021 Date Inactivated: 12/19/2021 Target Resolution Date: 10/31/2021 Goal Status: Unmet Unmet Reason: comorbities Ulcer/skin breakdown will heal within 14 weeks Date Initiated: 08/08/2021 Target Resolution Date: 11/14/2021 Goal Status: Active Interventions: Assess patient/caregiver ability to obtain necessary supplies Assess patient/caregiver ability to perform ulcer/skin care regimen upon admission and as needed Assess ulceration(s) every visit Provide education on ulcer and skin care Notes: Electronic Signature(s) Signed: 02/13/2022 4:28:34 PM By: Yevonne PaxEpps, Carrie RN Signed: 02/13/2022 5:10:30 PM By: Betha LoaVenable, Angie Entered By: Betha LoaVenable, Angie on 02/13/2022 08:37:10 Batiz, Mihran W. ([id number]) -------------------------------------------------------------------------------- Pain Assessment Details Patient Name: Boateng, Shaquelle W. Date of Service: 02/13/2022 8:15 AM Medical Record Number: [id number] Patient Account Number: [id number] Date of Birth/Sex: 01/18/1956 (66 y.o. M) Treating RN: Yevonne PaxEpps, Carrie Primary Care Dionne Rossa: Gershon CraneFry, Stephen Other Clinician: Betha LoaVenable, Angie Referring Alicha Raspberry: Gershon CraneFry, Stephen Treating Burlon Centrella/Extender: Rowan BlaseStone, Hoyt Weeks in Treatment: 27 Active Problems Location of Pain Severity and Description of Pain Patient Has Paino No Site Locations Pain Management and Medication Current Pain Management: Electronic Signature(s) Signed: 02/13/2022 4:28:34 PM By: Yevonne PaxEpps, Carrie RN Signed: 02/13/2022 5:10:30 PM  By: Betha LoaVenable, Angie Entered By: Betha LoaVenable, Angie on 02/13/2022 08:29:01 Woodmansee, Jahmal W. ([id number]) -------------------------------------------------------------------------------- Patient/Caregiver Education Details Patient Name: Senkbeil, Allon W. Date of Service: 02/13/2022 8:15 AM Medical Record Number: [id number] Patient Account Number: [id number] Date of Birth/Gender: 03/08/1956 (66 y.o. M) Treating RN: Yevonne PaxEpps, Carrie Primary Care Physician: Gershon CraneFry, Stephen Other Clinician: Betha LoaVenable, Angie Referring Physician: Gershon CraneFry, Stephen Treating Physician/Extender: Rowan BlaseStone, Hoyt Weeks in Treatment: 27 Education Assessment Education Provided To: Patient Education Topics Provided Wound/Skin Impairment: Handouts: Other: continue wound care as directed Methods: Explain/Verbal Responses: State content correctly Electronic Signature(s) Signed: 02/13/2022 5:10:30 PM By: Betha LoaVenable, Angie Entered By: Betha LoaVenable, Angie on 02/13/2022 08:48:23 Selden, Stephen W. ([id number]) -------------------------------------------------------------------------------- Wound Assessment Details Patient Name: Spindel, Ikeem W. Date of Service: 02/13/2022 8:15 AM Medical Record Number: [id number] Patient Account Number: [id number] Date of Birth/Sex: 09/30/1955 (66 y.o. M) Treating RN: Yevonne PaxEpps, Carrie Primary Care Dariah Mcsorley: Gershon CraneFry, Stephen Other Clinician: Betha LoaVenable, Angie Referring Samanvitha Germany: Gershon CraneFry, Stephen  Treating Brinklee Cisse/Extender: Allen Derry Weeks in Treatment: 27 Wound Status Wound Number: 1 Primary Etiology: Diabetic Wound/Ulcer of the Lower Extremity Wound Location: Left Calcaneus Wound Status: Open Wounding Event: Pressure Injury Comorbid Cataracts, Hypertension, Type II Diabetes, History: Neuropathy Date Acquired: 06/19/2021 Weeks Of Treatment: 27 Clustered Wound: No Photos Wound Measurements Length: (cm) 2 Width: (cm) 2.7 Depth: (cm) 0.2 Area: (cm) 4.241 Volume: (cm) 0.848 % Reduction in Area: -86.8% % Reduction in Volume:  -273.6% Epithelialization: Small (1-33%) Tunneling: No Undermining: No Wound Description Classification: Grade 2 Exudate Amount: Medium Exudate Type: Serosanguineous Exudate Color: red, brown Foul Odor After Cleansing: No Slough/Fibrino Yes Wound Bed Granulation Amount: Medium (34-66%) Exposed Structure Granulation Quality: Red, Hyper-granulation Fat Layer (Subcutaneous Tissue) Exposed: Yes Necrotic Amount: Medium (34-66%) Necrotic Quality: Adherent Slough Treatment Notes Wound #1 (Calcaneus) Wound Laterality: Left Cleanser Normal Saline Discharge Instruction: Wash your hands with soap and water. Remove old dressing, discard into plastic bag and place into trash. Cleanse the wound with Normal Saline prior to applying a clean dressing using gauze sponges, not tissues or cotton balls. Do not scrub or use excessive force. Pat dry using gauze sponges, not tissue or cotton balls. Peri-Wound Care Topical Lorence, Nathyn W. ([id number]) Primary Dressing Prisma 4.34 (in) Discharge Instruction: Moisten w/normal saline or sterile water; Cover wound as directed. Do not remove from wound bed. Secondary Dressing Drawtex Hydroconductive Wound Dressing,o2x2 (in/in) Discharge Instruction: Apply to wound with drainage. (BORDER) Zetuvit Plus SILICONE BORDER Dressing 5x5 (in/in) Discharge Instruction: Please do not put silicone bordered dressings under wraps. Use non-bordered dressing only. Secured With Compression Wrap Compression Stockings Facilities manager) Signed: 02/13/2022 4:28:34 PM By: Yevonne Pax RN Signed: 02/13/2022 5:10:30 PM By: Betha Loa Entered By: Betha Loa on 02/13/2022 08:35:17 Daza, Yuvraj W. ([id number]) -------------------------------------------------------------------------------- Vitals Details Patient Name: Munday, Jonluke W. Date of Service: 02/13/2022 8:15 AM Medical Record Number: [id number] Patient Account Number: [id number] Date of Birth/Sex:  1956-04-04 (66 y.o. M) Treating RN: Yevonne Pax Primary Care Matheus Spiker: [name] Other Clinician: Betha Loa Referring Nael Petrosyan: [name] Treating Riya Huxford/Extender: Rowan Blase in Treatment: 27 Vital Signs Time Taken: 08:26 Temperature (F): 98.0 Height (in): 68 Pulse (bpm): 74 Weight (lbs): 220 Respiratory Rate (breaths/min): 18 Body Mass Index (BMI): 33.4 Blood Pressure (mmHg): 166/80 Reference Range: 80 - 120 mg / dl Electronic Signature(s) Signed: 02/13/2022 5:10:30 PM By: Betha Loa Entered By: Betha Loa on 02/13/2022 13:24:40

## 2022-02-14 NOTE — Telephone Encounter (Signed)
Last Rx given on 2/28 for #30 with 5 ref

## 2022-02-27 ENCOUNTER — Encounter: Payer: No Typology Code available for payment source | Attending: Physician Assistant | Admitting: Physician Assistant

## 2022-02-27 DIAGNOSIS — E1169 Type 2 diabetes mellitus with other specified complication: Secondary | ICD-10-CM | POA: Insufficient documentation

## 2022-02-27 DIAGNOSIS — L97422 Non-pressure chronic ulcer of left heel and midfoot with fat layer exposed: Secondary | ICD-10-CM | POA: Insufficient documentation

## 2022-02-27 DIAGNOSIS — I10 Essential (primary) hypertension: Secondary | ICD-10-CM | POA: Diagnosis not present

## 2022-02-27 DIAGNOSIS — M86672 Other chronic osteomyelitis, left ankle and foot: Secondary | ICD-10-CM | POA: Insufficient documentation

## 2022-02-27 DIAGNOSIS — I872 Venous insufficiency (chronic) (peripheral): Secondary | ICD-10-CM | POA: Insufficient documentation

## 2022-02-27 DIAGNOSIS — E11621 Type 2 diabetes mellitus with foot ulcer: Secondary | ICD-10-CM | POA: Diagnosis present

## 2022-02-27 DIAGNOSIS — E1151 Type 2 diabetes mellitus with diabetic peripheral angiopathy without gangrene: Secondary | ICD-10-CM | POA: Diagnosis not present

## 2022-02-27 NOTE — Progress Notes (Signed)
Likes, Rilyn Lacretia Nicks ([id number]) Visit Report for 02/27/2022 Chief Complaint Document Details Patient Name: [name], [name] Date of Service: 02/27/2022 9:00 AM Medical Record Number: [id number] Patient Account Number: [id number] Date of Birth/Sex: 14-Jun-1956 (66 y.o. M) Treating RN: Huel Coventry Primary Care Provider: [name] Other Clinician: Betha Loa Referring Provider: [name] Treating Provider/Extender: Rowan Blase in Treatment: 29 Information Obtained from: Patient Chief Complaint Left heel ulcer Electronic Signature(s) Signed: 02/27/2022 9:22:07 AM By: Lenda Kelp PA-C Entered By: Lenda Kelp on 02/27/2022 09:22:07 [name], [name] ([id number]) -------------------------------------------------------------------------------- HPI Details Patient Name: [name], [name] Date of Service: 02/27/2022 9:00 AM Medical Record Number: [id number] Patient Account Number: [id number] Date of Birth/Sex: March 07, 1956 (66 y.o. M) Treating RN: Huel Coventry Primary Care Provider: [name] Other Clinician: Betha Loa Referring Provider: [name] Treating Provider/Extender: Rowan Blase in Treatment: 29 History of Present Illness HPI Description: 08/08/2021 upon evaluation today patient appears to be doing somewhat poorly in regard to a heel ulcer that began as a result of the patient tells me him trying to get his foot into his shoes that were somewhat tight. This was around 4 January. He was using a shoehorn fairly aggressively and thinks that he may have caused the bruising at that time. Fortunately there does not appear to be any signs of infection at this point. Nonetheless he does have some definite necrotic tissue. This can need to be cleared away. Nonetheless unfortunately [name] we do not have clearance in my opinion therefore to go forward with an aggressive sharp debridement. Patient does have diabetes mellitus type 2 with the most recent  hemoglobin A1c of 5.2 and that was on June 2022. He also has a history of chronic venous insufficiency, peripheral vascular disease, and hypertension. Obviously with regard to the peripheral vascular disease we will continue to send him to vascular to quantify his ABIs and TBI's. 08/22/2021 upon evaluation today patient appears to be doing well with regard to his wound. This is definitely showing some signs of improvement which is great news and overall very pleased with where things stand today. There does not appear to be any evidence of active infection locally nor systemically which is great news and overall I feel like that this is showing signs of improvement. It is definitely much less irritated as compared to last week. I am very pleased in that regard I am good to be able to get a lot of this dry skin off today as well which is great news. 09/05/2021 upon evaluation today patient's heel actually showing signs of some improvement though there is still a bit of eschar noted in the center part of the wound. I do believe this is going require some sharp debridement to clear this away. The Iodosorb just is not quite cutting it. Fortunately there does not appear to be any signs of active infection at this time locally nor systemically. 09/12/2021 upon evaluation today patient appears to be doing well with regard to his wound. He has been tolerating the dressing changes I do believe that clearing off the eschar last week has been to help there is still some necrotic tissue removed today overall he seems to be doing quite well however. 09/20/2021 upon evaluation today patient appears to be doing well with regard to his wound he does have some necrotic tissue noted on the surface of the wound but I do feel like we are getting better here definitely making good progress. I do  not see any signs of infection which is great news and overall I think that we are on the right track. 09-26-2021 upon evaluation  today patient appears to be doing well with regard to his heel ulcer. This is actually measuring smaller and looking better today. Fortunately I do not see any evidence of active infection locally or systemically which is great news. 10-03-2021 upon evaluation today patient appears to be doing well currently in regard to his heel for the most part although he does have a little bit of what appears to be bruising in the base of the wound. I am not sure exactly where to get the pressure from but this very likely is happening when he is walking he tells me has been wearing some other shoes been a little bit more on his feet than normal. Fortunately I do not see any evidence of active infection at this time. I have contemplated put him in a different type of shoe or even a total contact cast to be honest. With that being said his balance is not good and I therefore have some concerns in that regard. 10-17-2021 upon evaluation today patient appears to be doing poorly in regard to his heel. Again this is showing signs of significant deep tissue injury which is bringing into question whether or not he may actually have something going on here which is more related to his blood flow. We have previously done arterial study though to be honest with ABIs right at 1 and noncompressible which tends to make me think this may be falsely elevated coupled with TBI's which were at 0.5 with biphasic waveforms I am not certain he is getting the blood flow needed to completely heal this wound. I discussed with him today that he may need to see vascular in order to discuss options for her further angiogram/evaluation in general. I would at least leave it up to them as to whether they feel the angiogram is necessary or not. Nonetheless I do believe pressure is also playing a component to this he does tell me he started sleeping with his heel on the bed not raised underneath the pillow already this was dark and discolored that  may have been what triggered this to get worse I am not really sure. 5/8; the wound seems to have less of a deep tissue injury but there is still eschar. Scant amount of purulent drainage some erythema medially extending up the heel. He has neuropathy no pain no systemic illness. Culture that we did last time showed moderate Proteus, moderate Enterococcus and moderate Pseudomonas. The Enterococcus will require ampicillin so he will require 2 antibiotics. He is using Santyl to the wound 10-31-2021 upon evaluation today patient appears to be doing well with regard to his wound all things considered. There is still necrotic tissue noted in the central portion of the wound which is not what we want to see but unfortunately I think this is just could be the case until we ensure he has good blood flow and can subsequently get things moving in a better direction. Fortunately I do not see any evidence of infection locally or systemically which is great news and overall I think that we are on the right track in that regard. I am going to go ahead and refill the Augmentin as well as the Cipro for him today he was given originally 10 days worth I would have her extend that for 7 days. 11-07-2021 upon evaluation today patient appears to  be doing better in regard to the overall appearance of his wound and I do feel like the Annitta Needs is doing a good job here. Fortunately there does not appear to be any evidence of active infection locally nor systemically which is great news and overall I am extremely pleased with where things stand today. The patient is happy as well with the progress. Nonetheless I had extended his Augmentin and Cipro for him at the last visit I think that is going to need to be extended a bit further. That means he is probably can need some probiotics as well. 11-21-2021 upon evaluation today patient appears to be doing a little better in regard to size and is definitely making some progress here.  With that being said he also definitely has some areas that are still fairly thick with necrotic tissue. We discussed debridement yet again today and he is [name], [name] ([id number]) amenable to doing some debridement as [place] as its not too aggressive which I think we can definitely do. I do believe the Annitta Needs is doing a great job and will definitely be continuing that as well. 12-05-2021 upon evaluation today patient appears to be doing well with regard to his heel. He has been tolerating the dressing changes without complication. Fortunately there does not appear to be any evidence of active infection locally or systemically at this time. No fevers, chills, nausea, vomiting, or diarrhea. 12-19-2021 upon evaluation today patient appears to be doing excellent in regard to his wound. Has been tolerating the Santyl in an excellent fashion. Fortunately I do not see any evidence of infection locally or systemically which is great news and overall I am very pleased with where we stand today. 01-02-2022 upon evaluation today patient appears to be doing excellent in regard to his wound. He has been tolerating the dressing changes without complication. Fortunately I do not see any evidence of active infection there is minimal amount of slough and biofilm buildup which I Minna perform debridement in regard to today but other than this overall I really feel like that the patient is doing excellent and I think he is ready to switch to a collagen based dressing. I think this is going to do well for him. 01-19-2022 upon evaluation today patient appears to be doing much better in regard to his wound. He has been tolerating the dressing changes without complication. Fortunately there does not appear to be any evidence of active infection locally or systemically at this time which is great news and overall I am extremely pleased with where we stand. There does not appear to be any evidence of active infection whatsoever  which is great news. No fevers, chills, nausea, vomiting, or diarrhea. 01-30-2022 upon evaluation today patient appears to be doing well currently in regard to his wound. This is actually showing signs of excellent improvement I am very pleased based on what we are seeing today. I do not see any evidence of active infection locally or systemically at this time. 02-13-2022 upon evaluation today patient appears to be doing excellent in regard to his wound. This is showing signs of a little bit of maceration otherwise it seems to be a little bit smaller which is great news. Overall very pleased with where things stand at this point. 02-27-2022 upon evaluation today patient appears to be doing well currently in regard to his wound. He has been tolerating the dressing changes without complication. Fortunately I see no evidence of active infection locally or systemically at  this time which is great news. No fevers, chills, nausea, vomiting, or diarrhea. Electronic Signature(s) Signed: 02/27/2022 9:51:30 AM By: Lenda Kelp PA-C Entered By: Lenda Kelp on 02/27/2022 09:51:29 Babel, [name] ([id number]) -------------------------------------------------------------------------------- Physical Exam Details Patient Name: Wicke, Arlin W. Date of Service: 02/27/2022 9:00 AM Medical Record Number: [id number] Patient Account Number: [id number] Date of Birth/Sex: [birth year] (66 y.o. M) Treating RN: Huel Coventry Primary Care Provider: [name] Other Clinician: Betha Loa Referring Provider: [name] Treating Provider/Extender: Allen Derry Weeks in Treatment: 29 Constitutional Well-nourished and well-hydrated in no acute distress. Respiratory normal breathing without difficulty. Psychiatric this patient is able to make decisions and demonstrates good insight into disease process. Alert and Oriented x 3. pleasant and cooperative. Notes Upon inspection patient's wound bed actually is showing  signs of good granulation and epithelization with that being said it is very slow to heal wound and I think at this point he would benefit from switching over to something like Grafix to try to speed up the healing process. He is actually in agreement with this plan he is also been approved for this working to see about ordering that for application starting next week. Electronic Signature(s) Signed: 02/27/2022 9:51:49 AM By: Lenda Kelp PA-C Entered By: Lenda Kelp on 02/27/2022 09:51:49 Shuey, Bereket W. ([id number]) -------------------------------------------------------------------------------- Physician Orders Details Patient Name: Koehl, Shaydon W. Date of Service: 02/27/2022 9:00 AM Medical Record Number: [id number] Patient Account Number: [id number] Date of Birth/Sex: 07-May-1956 (66 y.o. 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 o Nurse Visit as needed [facility] o Wash wounds with antibacterial soap and water. o May shower; gently cleanse wound with antibacterial soap, rinse and pat dry prior to dressing wounds - shower with dressing on and remove after shower o No tub bath. Anesthetic (Use 'Patient Medications' Section for Anesthetic Order Entry) o Lidocaine applied to wound bed Cellular or Tissue Based Products o Cellular or Tissue Based Product  Type: - order grafix Edema Control - Lymphedema / Segmental Compressive Device / Other o Elevate, Exercise Daily and Avoid Standing for Agrawal Periods of Time. o Elevate leg(s) parallel to the floor when sitting. o DO YOUR BEST to sleep in the bed at night. DO NOT sleep in your recliner. Jenson hours of sitting in a recliner leads to swelling of the legs and/or potential wounds on your backside. Wound Treatment Wound #1 - Calcaneus Wound Laterality: Left Cleanser: Normal Saline 3 x Per Week/30 Days Discharge Instructions: Wash your hands with soap and water. Remove old dressing, discard into plastic bag and place into trash. Cleanse the wound with Normal Saline prior to applying a clean dressing using gauze sponges, not tissues or cotton balls. Do not scrub or use excessive force. Pat dry using gauze sponges, not tissue or cotton balls. Primary Dressing: Prisma 4.34 (in) (Dispense As Written) 3 x Per Week/30 Days Discharge Instructions: Moisten w/normal saline or sterile water; Cover wound as directed. Do not remove from wound bed. Secondary Dressing: Drawtex Hydroconductive Wound Dressing,o2x2 (  in/in) (DME) (Generic) 3 x Per Week/30 Days Discharge Instructions: Apply to wound with drainage. Secondary Dressing: (BORDER) Zetuvit Plus SILICONE BORDER Dressing 5x5 (in/in) (Generic) 3 x Per Week/30 Days Discharge Instructions: Please do not put silicone bordered dressings under wraps. Use non-bordered dressing only. Electronic Signature(s) Unsigned Entered By: Betha Loa on 02/27/2022 09:45:17 Signature(s): Date(s): [name], [name] ([id number]) [name], [name] ([id number]) -------------------------------------------------------------------------------- Problem List Details Patient Name: [name], [name] Date of Service: 02/27/2022 9:00 AM Medical Record Number: [id number] Patient Account Number: [id number] Date of Birth/Sex: [birth year] (66 y.o. M) Treating RN: Huel Coventry Primary Care  Provider: [name] Other Clinician: Betha Loa Referring Provider: [name] Treating Provider/Extender: Allen Derry Weeks in Treatment: 29 Active Problems ICD-10 Encounter Code Description Active Date MDM Diagnosis E11.621 Type 2 diabetes mellitus with foot ulcer 08/08/2021 No Yes L97.422 Non-pressure chronic ulcer of left heel and midfoot with fat layer 08/08/2021 No Yes exposed I77.824 Other chronic osteomyelitis, left ankle and foot 11/07/2021 No Yes I87.2 Venous insufficiency (chronic) (peripheral) 08/08/2021 No Yes I73.89 Other specified peripheral vascular diseases 08/08/2021 No Yes I10 Essential (primary) hypertension 08/08/2021 No Yes Inactive Problems Resolved Problems Electronic Signature(s) Signed: 02/27/2022 9:22:02 AM By: Lenda Kelp PA-C Entered By: Lenda Kelp on 02/27/2022 09:22:02 Asare, Jahseh W. ([id number]) -------------------------------------------------------------------------------- Progress Note Details Patient Name: [name], [name] Date of Service: 02/27/2022 9:00 AM Medical Record Number: [id number] Patient Account Number: [id number] Date of Birth/Sex: [birth year] (66 y.o. M) Treating RN: Huel Coventry Primary Care Provider: [name] Other Clinician: Betha Loa Referring Provider: [name] Treating Provider/Extender: Rowan Blase in Treatment: 29 Subjective Chief Complaint Information obtained from Patient Left heel ulcer History of Present Illness (HPI) 08/08/2021 upon evaluation today patient appears to be doing somewhat poorly in regard to a heel ulcer that began as a result of the patient tells me him trying to get his foot into his shoes that were somewhat tight. This was around 4 January. He was using a shoehorn fairly aggressively and thinks that he may have caused the bruising at that time. Fortunately there does not appear to be any signs of infection at this point. Nonetheless he does have some definite necrotic tissue.  This can need to be cleared away. Nonetheless unfortunately [name] we do not have clearance in my opinion therefore to go forward with an aggressive sharp debridement. Patient does have diabetes mellitus type 2 with the most recent hemoglobin A1c of 5.2 and that was on June 2022. He also has a history of chronic venous insufficiency, peripheral vascular disease, and hypertension. Obviously with regard to the peripheral vascular disease we will continue to send him to vascular to quantify his ABIs and TBI's. 08/22/2021 upon evaluation today patient appears to be doing well with regard to his wound. This is definitely showing some signs of improvement which is great news and overall very pleased with where things stand today. There does not appear to be any evidence of active infection locally nor systemically which is great news and overall I feel like that this is showing signs of improvement. It is definitely much less irritated as compared to last week. I am very pleased in that regard I am good to be able to get a lot of this dry skin off today as well which is great news. 09/05/2021 upon evaluation today patient's heel actually showing signs of some improvement though there is still a bit of eschar noted in the center part of the wound. I  do believe this is going require some sharp debridement to clear this away. The Iodosorb just is not quite cutting it. Fortunately there does not appear to be any signs of active infection at this time locally nor systemically. 09/12/2021 upon evaluation today patient appears to be doing well with regard to his wound. He has been tolerating the dressing changes I do believe that clearing off the eschar last week has been to help there is still some necrotic tissue removed today overall he seems to be doing quite well however. 09/20/2021 upon evaluation today patient appears to be doing well with regard to his wound he does have some necrotic  tissue noted on the surface of the wound but I do feel like we are getting better here definitely making good progress. I do not see any signs of infection which is great news and overall I think that we are on the right track. 09-26-2021 upon evaluation today patient appears to be doing well with regard to his heel ulcer. This is actually measuring smaller and looking better today. Fortunately I do not see any evidence of active infection locally or systemically which is great news. 10-03-2021 upon evaluation today patient appears to be doing well currently in regard to his heel for the most part although he does have a little bit of what appears to be bruising in the base of the wound. I am not sure exactly where to get the pressure from but this very likely is happening when he is walking he tells me has been wearing some other shoes been a little bit more on his feet than normal. Fortunately I do not see any evidence of active infection at this time. I have contemplated put him in a different type of shoe or even a total contact cast to be honest. With that being said his balance is not good and I therefore have some concerns in that regard. 10-17-2021 upon evaluation today patient appears to be doing poorly in regard to his heel. Again this is showing signs of significant deep tissue injury which is bringing into question whether or not he may actually have something going on here which is more related to his blood flow. We have previously done arterial study though to be honest with ABIs right at 1 and noncompressible which tends to make me think this may be falsely elevated coupled with TBI's which were at 0.5 with biphasic waveforms I am not certain he is getting the blood flow needed to completely heal this wound. I discussed with him today that he may need to see vascular in order to discuss options for her further angiogram/evaluation in general. I would at least leave it up to them as to  whether they feel the angiogram is necessary or not. Nonetheless I do believe pressure is also playing a component to this he does tell me he started sleeping with his heel on the bed not raised underneath the pillow already this was dark and discolored that may have been what triggered this to get worse I am not really sure. 5/8; the wound seems to have less of a deep tissue injury but there is still eschar. Scant amount of purulent drainage some erythema medially extending up the heel. He has neuropathy no pain no systemic illness. Culture that we did last time showed moderate Proteus, moderate Enterococcus and moderate Pseudomonas. The Enterococcus will require ampicillin so he will require 2 antibiotics. He is using Santyl to the wound 10-31-2021 upon evaluation  today patient appears to be doing well with regard to his wound all things considered. There is still necrotic tissue noted in the central portion of the wound which is not what we want to see but unfortunately I think this is just could be the case until we ensure he has good blood flow and can subsequently get things moving in a better direction. Fortunately I do not see any evidence of infection locally or systemically which is great news and overall I think that we are on the right track in that regard. I am going to go ahead and refill the Augmentin as well as the Cipro for him today he was given originally 10 days worth I would have her extend that for 7 days. 11-07-2021 upon evaluation today patient appears to be doing better in regard to the overall appearance of his wound and I do feel like the [facility] Popper is doing a good job here. Fortunately there does not appear to be any evidence of active infection locally nor systemically which is great news and overall I am extremely pleased with where things stand today. The patient is happy as well with the progress. Nonetheless I had extended his Willmott, Rebecca W. ([id number]) Augmentin and Cipro  for him at the last visit I think that is going to need to be extended a bit further. That means he is probably can need some probiotics as well. 11-21-2021 upon evaluation today patient appears to be doing a little better in regard to size and is definitely making some progress here. With that being said he also definitely has some areas that are still fairly thick with necrotic tissue. We discussed debridement yet again today and he is amenable to doing some debridement as [place] as its not too aggressive which I think we can definitely do. I do believe the Melburn Popper is doing a great job and will definitely be continuing that as well. 12-05-2021 upon evaluation today patient appears to be doing well with regard to his heel. He has been tolerating the dressing changes without complication. Fortunately there does not appear to be any evidence of active infection locally or systemically at this time. No fevers, chills, nausea, vomiting, or diarrhea. 12-19-2021 upon evaluation today patient appears to be doing excellent in regard to his wound. Has been tolerating the Santyl in an excellent fashion. Fortunately I do not see any evidence of infection locally or systemically which is great news and overall I am very pleased with where we stand today. 01-02-2022 upon evaluation today patient appears to be doing excellent in regard to his wound. He has been tolerating the dressing changes without complication. Fortunately I do not see any evidence of active infection there is minimal amount of slough and biofilm buildup which I Minna perform debridement in regard to today but other than this overall I really feel like that the patient is doing excellent and I think he is ready to switch to a collagen based dressing. I think this is going to do well for him. 01-19-2022 upon evaluation today patient appears to be doing much better in regard to his wound. He has been tolerating the dressing changes without complication.  Fortunately there does not appear to be any evidence of active infection locally or systemically at this time which is great news and overall I am extremely pleased with where we stand. There does not appear to be any evidence of active infection whatsoever which is great news. No fevers, chills, nausea, vomiting, or  diarrhea. 01-30-2022 upon evaluation today patient appears to be doing well currently in regard to his wound. This is actually showing signs of excellent improvement I am very pleased based on what we are seeing today. I do not see any evidence of active infection locally or systemically at this time. 02-13-2022 upon evaluation today patient appears to be doing excellent in regard to his wound. This is showing signs of a little bit of maceration otherwise it seems to be a little bit smaller which is great news. Overall very pleased with where things stand at this point. 02-27-2022 upon evaluation today patient appears to be doing well currently in regard to his wound. He has been tolerating the dressing changes without complication. Fortunately I see no evidence of active infection locally or systemically at this time which is great news. No fevers, chills, nausea, vomiting, or diarrhea. Objective Constitutional Well-nourished and well-hydrated in no acute distress. Vitals Time Taken: 9:10 AM, Height: 68 in, Weight: 220 lbs, BMI: 33.4, Temperature: 98.1 F, Pulse: 77 bpm, Respiratory Rate: 18 breaths/min, Blood Pressure: 181/92 mmHg. Respiratory normal breathing without difficulty. Psychiatric this patient is able to make decisions and demonstrates good insight into disease process. Alert and Oriented x 3. pleasant and cooperative. General Notes: Upon inspection patient's wound bed actually is showing signs of good granulation and epithelization with that being said it is very slow to heal wound and I think at this point he would benefit from switching over to something like Grafix to  try to speed up the healing process. He is actually in agreement with this plan he is also been approved for this working to see about ordering that for application starting next week. Integumentary (Hair, Skin) Wound #1 status is Open. Original cause of wound was Pressure Injury. The date acquired was: 06/19/2021. The wound has been in treatment 29 weeks. The wound is located on the Left Calcaneus. The wound measures 1.8cm length x 2.5cm width x 0.2cm depth; 3.534cm^2 area and 0.707cm^3 volume. There is Fat Layer (Subcutaneous Tissue) exposed. There is no tunneling or undermining noted. There is a medium amount of serosanguineous drainage noted. There is medium (34-66%) red, hyper - granulation within the wound bed. There is a medium (34-66%) amount of necrotic tissue within the wound bed including Adherent Slough. Assessment Harl, Clancey W. 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 Nurse Visit as needed Bathing/ Shower/ Hygiene: Wash wounds with antibacterial soap and water. May shower; gently cleanse wound with antibacterial soap, rinse and pat dry prior to dressing wounds - shower with dressing on and remove after shower No tub bath. Anesthetic (Use 'Patient Medications' Section for Anesthetic Order Entry): Lidocaine applied to wound bed Cellular or Tissue Based Products: Cellular or Tissue Based Product Type: - order grafix Edema Control - Lymphedema / Segmental Compressive Device / Other: Elevate, Exercise Daily and Avoid Standing for Etsitty Periods of Time. Elevate leg(s) parallel to the floor when sitting. DO YOUR BEST to sleep in the bed at  night. DO NOT sleep in your recliner. Wayment hours of sitting in a recliner leads to swelling of the legs and/or potential wounds on your backside. WOUND #1: - Calcaneus Wound Laterality: Left Cleanser: Normal Saline 3 x Per Week/30 Days Discharge Instructions:  Wash your hands with soap and water. Remove old dressing, discard into plastic bag and place into trash. Cleanse the wound with Normal Saline prior to applying a clean dressing using gauze sponges, not tissues or cotton balls. Do not scrub or use excessive force. Pat dry using gauze sponges, not tissue or cotton balls. Primary Dressing: Prisma 4.34 (in) (Dispense As Written) 3 x Per Week/30 Days Discharge Instructions: Moisten w/normal saline or sterile water; Cover wound as directed. Do not remove from wound bed. Secondary Dressing: Drawtex Hydroconductive Wound Dressing, 2x2 (in/in) (DME) (Generic) 3 x Per Week/30 Days Discharge Instructions: Apply to wound with drainage. Secondary Dressing: (BORDER) Zetuvit Plus SILICONE BORDER Dressing 5x5 (in/in) (Generic) 3 x Per Week/30 Days Discharge Instructions: Please do not put silicone bordered dressings under wraps. Use non-bordered dressing only. 1. I would recommend that we go ahead and continue with the wound care measures as before and the patient is in agreement with the plan. This includes the use of the silver collagen which I think is doing quite well. 2. I am also can recommend that we have the patient continue with the drawtex to cover. 3. We will also continue with the Zetuvit bordered foam dressing which I think is doing quite well over top. We will see patient back for reevaluation in 1 week here in the clinic. If anything worsens or changes patient will contact our office for additional recommendations. We will plan to start applying the Grafix beginning next week he has an appointment Monday morning at 8:15 AM. Electronic Signature(s) Signed: 02/27/2022 9:52:33 AM By: Lenda Kelp PA-C Entered By: Lenda Kelp on 02/27/2022 09:52:33 Knippenberg, Czar W. ([id number]) -------------------------------------------------------------------------------- SuperBill Details Patient Name: Swango, Arnaldo W. Date of Service: 02/27/2022 Medical Record Number: [id number] Patient Account Number: [id number] Date of Birth/Sex: [birth year] (66 y.o. M) Treating RN: Huel Coventry Primary Care Provider: [name] Other Clinician: Betha Loa Referring Provider: [name] Treating Provider/Extender: Allen Derry Weeks in Treatment: 29 Diagnosis Coding ICD-10 Codes Code Description E11.621 Type 2 diabetes mellitus with foot ulcer L97.422 Non-pressure chronic ulcer of left heel and midfoot with fat layer exposed M86.672 Other chronic osteomyelitis, left ankle and foot I87.2 Venous insufficiency (chronic) (peripheral) I73.89 Other specified peripheral vascular diseases I10 Essential (primary) hypertension Facility Procedures CPT4 Code: [id number] Description: 99213 - WOUND CARE VISIT-LEV 3 EST PT Modifier: Quantity: 1 Physician Procedures CPT4 Code: [id number] Description: 99213 - WC PHYS LEVEL 3 - EST PT Modifier: Quantity: 1 CPT4 Code: Description: ICD-10 Diagnosis Description E11.621 Type 2 diabetes mellitus with foot ulcer L97.422 Non-pressure chronic ulcer of left heel and midfoot with fat layer ex Z36.644 Other chronic osteomyelitis, left ankle and foot I87.2 Venous insufficiency  (chronic) (peripheral) Modifier: posed Quantity: Electronic Signature(s) Signed: 02/27/2022 9:58:27 AM By: Lenda Kelp PA-C Entered By: Lenda Kelp on 02/27/2022 09:58:27

## 2022-03-01 NOTE — Progress Notes (Signed)
[name], Gerald [name] ([id number]) Visit Report for 02/27/2022 Arrival Information Details Patient Name: [name], [name] Date of Service: 02/27/2022 9:00 AM Medical Record Number: [id number] Patient Account Number: [id number] Date of Birth/Sex: 04/20/1956 (66 y.o. M) Treating RN: Cornell Barman Primary Care Viriginia Amendola: Alysia Penna Other Clinician: Massie Kluver Referring Isabella Ida: Alysia Penna Treating Dusan Lipford/Extender: Skipper Cliche in Treatment: 29 Visit Information History Since Last Visit All ordered tests and consults were completed: No Patient Arrived: [name] Added or deleted any medications: No Arrival Time: 09:09 Any new allergies or adverse reactions: No Transfer Assistance: None Had a fall or experienced change in No Patient Requires Transmission-Based No activities of daily living that may affect Precautions: risk of falls: Patient Has Alerts: Yes Hospitalized since last visit: No Patient Alerts: ABI R >1.0 [place] TBI Pain Present Now: No 0.50 ABI L 0.98 Electronic Signature(s) Signed: 03/01/2022 5:04:43 PM By: Massie Kluver Entered By: Massie Kluver on 02/27/2022 09:09:40 Neilan, Jaymason W. ([id number]) -------------------------------------------------------------------------------- Clinic Level of Care Assessment Details Patient Name: [name], [name] Date of Service: 02/27/2022 9:00 AM Medical Record Number: [id number] Patient Account Number: [id number] Date of Birth/Sex: 1956-02-12 (66 y.o. M) Treating RN: Cornell Barman Primary Care Mikolaj Woolstenhulme: Alysia Penna Other Clinician: Massie Kluver Referring Jone Panebianco: Alysia Penna Treating Damaria Stofko/Extender: Skipper Cliche in Treatment: 29 Clinic Level of Care Assessment Items TOOL 4 Quantity Score []  - Use when only an EandM is performed on FOLLOW-UP visit 0 ASSESSMENTS - Nursing Assessment / Reassessment X - Reassessment of Co-morbidities (includes updates in patient status) 1 10 X- 1 5 Reassessment of Adherence to Treatment  Plan ASSESSMENTS - Wound and Skin Assessment / Reassessment X - Simple Wound Assessment / Reassessment - one wound 1 5 []  - 0 Complex Wound Assessment / Reassessment - multiple wounds []  - 0 Dermatologic / Skin Assessment (not related to wound area) ASSESSMENTS - Focused Assessment []  - Circumferential Edema Measurements - multi extremities 0 []  - 0 Nutritional Assessment / Counseling / Intervention []  - 0 Lower Extremity Assessment (monofilament, tuning fork, pulses) []  - 0 Peripheral Arterial Disease Assessment (using hand held doppler) ASSESSMENTS - Ostomy and/or Continence Assessment and Care []  - Incontinence Assessment and Management 0 []  - 0 Ostomy Care Assessment and Management (repouching, etc.) PROCESS - Coordination of Care X - Simple Patient / Family Education for ongoing care 1 15 []  - 0 Complex (extensive) Patient / Family Education for ongoing care []  - 0 Staff obtains Programmer, systems, Records, Test Results / Process Orders []  - 0 Staff telephones HHA, Nursing Homes / Clarify orders / etc []  - 0 Routine Transfer to another Facility (non-emergent condition) []  - 0 Routine Hospital Admission (non-emergent condition) []  - 0 New Admissions / Biomedical engineer / Ordering NPWT, Apligraf, etc. []  - 0 Emergency Hospital Admission (emergent condition) X- 1 10 Simple Discharge Coordination []  - 0 Complex (extensive) Discharge Coordination PROCESS - Special Needs []  - Pediatric / Minor Patient Management 0 []  - 0 Isolation Patient Management []  - 0 Hearing / Language / Visual special needs []  - 0 Assessment of Community assistance (transportation, D/C planning, etc.) []  - 0 Additional assistance / Altered mentation []  - 0 Support Surface(s) Assessment (bed, cushion, seat, etc.) INTERVENTIONS - Wound Cleansing / Measurement Travieso, Chao W. ([id number]) X- 1 5 Simple Wound Cleansing - one wound []  - 0 Complex Wound Cleansing - multiple wounds X- 1 5 Wound  Imaging (photographs - any number of wounds) []  - 0 Wound Tracing (instead of photographs) X- 1 5 Simple  Wound Measurement - one wound []  - 0 Complex Wound Measurement - multiple wounds INTERVENTIONS - Wound Dressings []  - Small Wound Dressing one or multiple wounds 0 X- 1 15 Medium Wound Dressing one or multiple wounds []  - 0 Large Wound Dressing one or multiple wounds []  - 0 Application of Medications - topical []  - 0 Application of Medications - injection INTERVENTIONS - Miscellaneous []  - External ear exam 0 []  - 0 Specimen Collection (cultures, biopsies, blood, body fluids, etc.) []  - 0 Specimen(s) / Culture(s) sent or taken to Lab for analysis []  - 0 Patient Transfer (multiple staff / / Similar devices) []  - 0 Simple Staple / Suture removal (25 or less) []  - 0 Complex Staple / Suture removal (26 or more) []  - 0 Hypo / Hyperglycemic Management (close monitor of Blood Glucose) []  - 0 Ankle / Brachial Index (ABI) - do not check if billed separately X- 1 5 Vital Signs Has the patient been seen at the hospital within the last three years: Yes Total Score: 80 Level Of Care: New/Established - Level 3 Electronic Signature(s) Signed: 03/01/2022 5:04:43 PM By: Entered By: on 02/27/2022 09:35:46 Kiesling, Rama W. ( ) -------------------------------------------------------------------------------- Encounter Discharge Information Details Patient Name: [name], [name] Date of Service: 02/27/2022 9:00 AM Medical Record Number: Patient Account Number: Date of Birth/Sex: 07-03-1955 (66 y.o. M) Treating RN: Primary Care Nyla Creason: Other Clinician: Referring Melainie Krinsky: 03/03/2022 Treating Manoah Deckard/Extender: Betha Loa in Treatment: 29 Encounter Discharge Information Items Discharge Condition: Stable Ambulatory Status: Cane Discharge Destination:  Home Transportation: Private Auto Accompanied By: self Schedule Follow-up Appointment: Yes Clinical Summary of Care: Electronic Signature(s) Signed: 03/01/2022 5:04:43 PM By: [id number] Entered By: 04/29/2022 on 02/27/2022 09:46:42 Peer, [place] W. ([id number]) -------------------------------------------------------------------------------- Lower Extremity Assessment Details Patient Name: Maddix, Jayquon W. Date of Service: 02/27/2022 9:00 AM Medical Record Number: [id number] Patient Account Number: Huel Coventry Date of Birth/Sex: 02-Sep-1955 (66 y.o. M) Treating RN: [name] Primary Care Mcgregor Tinnon: Rowan Blase Other Clinician: 30 Referring Patriece Archbold: 03/03/2022 Treating Amairany Schumpert/Extender: Betha Loa Weeks in Treatment: 29 Edema Assessment Assessed: 04/29/2022: Yes] [id number]: No] Edema: [Left: Ye] [Right: s] Calf Left: Right: Point of Measurement: 30 cm From Medial Instep 36.3 cm Ankle Left: Right: Point of Measurement: 10 cm From Medial Instep 24.5 cm Vascular Assessment Pulses: Dorsalis Pedis Palpable: [Left:Yes] Posterior Tibial Palpable: [Left:Yes] Electronic Signature(s) Signed: 02/27/2022 5:08:52 PM By: [id number], BSN, RN, CWS, Kim RN, BSN Signed: 03/01/2022 5:04:43 PM By: 02/18/1956 Entered By: 77 on 02/27/2022 09:20:35 Cantero, Stepehn W. ([name]) -------------------------------------------------------------------------------- Multi Wound Chart Details Patient Name: Lopezperez, Zacory W. Date of Service: 02/27/2022 9:00 AM Medical Record Number: [name] Patient Account Number: Allen Derry Date of Birth/Sex: January 07, 1956 (66 y.o. M) Treating RN: Franne Forts Primary Care Elvyn Krohn: 04/29/2022 Other Clinician: Elliot Gurney Referring Georgann Bramble: 03/03/2022 Treating Ernst Cumpston/Extender: Betha Loa in Treatment: 29 Vital Signs Height(in): 68 Pulse(bpm): 77 Weight(lbs): 220 Blood Pressure(mmHg): 181/92 Body Mass Index(BMI): 33.4 Temperature(F):  98.1 Respiratory Rate(breaths/min): 18 Photos: [N/A:N/A] Wound Location: Left Calcaneus N/A N/A Wounding Event: Pressure Injury N/A N/A Primary Etiology: Diabetic Wound/Ulcer of the Lower N/A N/A Extremity Comorbid History: Cataracts, Hypertension, Type II N/A N/A Diabetes, Neuropathy Date Acquired: 06/19/2021 N/A N/A Weeks of Treatment: 29 N/A N/A Wound Status: Open N/A N/A Wound Recurrence: No N/A N/A Measurements L x W x D (cm) 1.8x2.5x0.2 N/A N/A Area (cm) : 3.534 N/A N/A Volume (cm) :  0.707 N/A N/A % Reduction in Area: -55.70% N/A N/A % Reduction in Volume: -211.50% N/A N/A Classification: Grade 2 N/A N/A Exudate Amount: Medium N/A N/A Exudate Type: Serosanguineous N/A N/A Exudate Color: red, brown N/A N/A Granulation Amount: Medium (34-66%) N/A N/A Granulation Quality: Red, Hyper-granulation N/A N/A Necrotic Amount: Medium (34-66%) N/A N/A Exposed Structures: Fat Layer (Subcutaneous Tissue): N/A N/A Yes Epithelialization: Small (1-33%) N/A N/A Treatment Notes Electronic Signature(s) Signed: 03/01/2022 5:04:43 PM By: Massie Kluver Entered By: Massie Kluver on 02/27/2022 09:20:52 Olgin, Dael W. ([id number]) -------------------------------------------------------------------------------- [place] Details Patient Name: Arns, Ezekiah W. Date of Service: 02/27/2022 9:00 AM Medical Record Number: [id number] Patient Account Number: [id number] Date of Birth/Sex: [birth year] (66 y.o. M) Treating RN: Cornell Barman Primary Care Loukas Antonson: Alysia Penna Other Clinician: Massie Kluver Referring Makale Pindell: Alysia Penna Treating Julie Nay/Extender: Skipper Cliche in Treatment: 29 Active Inactive Wound/Skin Impairment Nursing Diagnoses: Impaired tissue integrity Knowledge deficit related to ulceration/compromised skin integrity Goals: Ulcer/skin breakdown will have a volume reduction of 30% by week 4 Date Initiated: 08/08/2021 Date Inactivated: 12/19/2021 Target  Resolution Date: 09/05/2021 Goal Status: Unmet Unmet Reason: comorbities Ulcer/skin breakdown will have a volume reduction of 50% by week 8 Date Initiated: 08/08/2021 Date Inactivated: 12/19/2021 Target Resolution Date: 10/03/2021 Goal Status: Unmet Unmet Reason: comorbities Ulcer/skin breakdown will have a volume reduction of 80% by week 12 Date Initiated: 08/08/2021 Date Inactivated: 12/19/2021 Target Resolution Date: 10/31/2021 Goal Status: Unmet Unmet Reason: comorbities Ulcer/skin breakdown will heal within 14 weeks Date Initiated: 08/08/2021 Target Resolution Date: 11/14/2021 Goal Status: Active Interventions: Assess patient/caregiver ability to obtain necessary supplies Assess patient/caregiver ability to perform ulcer/skin care regimen upon admission and as needed Assess ulceration(s) every visit Provide education on ulcer and skin care Notes: Electronic Signature(s) Signed: 02/27/2022 5:08:52 PM By: Gretta Cool, BSN, RN, CWS, Kim RN, BSN Signed: 03/01/2022 5:04:43 PM By: Massie Kluver Entered By: Massie Kluver on 02/27/2022 09:20:42 Trimm, Emir W. ([id number]) -------------------------------------------------------------------------------- Pain Assessment Details Patient Name: Rothrock, Kol W. Date of Service: 02/27/2022 9:00 AM Medical Record Number: [id number] Patient Account Number: [id number] Date of Birth/Sex: [birth year] (66 y.o. M) Treating RN: Cornell Barman Primary Care Orli Degrave: Alysia Penna Other Clinician: Massie Kluver Referring Dyami Umbach: Alysia Penna Treating Kavita Bartl/Extender: Skipper Cliche in Treatment: 29 Active Problems Location of Pain Severity and Description of Pain Patient Has Paino No Site Locations Pain Management and Medication Current Pain Management: Electronic Signature(s) Signed: 02/27/2022 5:08:52 PM By: Gretta Cool, BSN, RN, CWS, Kim RN, BSN Signed: 03/01/2022 5:04:43 PM By: Massie Kluver Entered By: Massie Kluver on 02/27/2022 09:12:31 Giorgio, Nahshon W.  ([id number]) -------------------------------------------------------------------------------- Patient/Caregiver Education Details Patient Name: Kamm, Rockie W. Date of Service: 02/27/2022 9:00 AM Medical Record Number: [id number] Patient Account Number: [id number] Date of Birth/Gender: May 23, 1956 (66 y.o. M) Treating RN: Cornell Barman Primary Care Physician: Alysia Penna Other Clinician: Massie Kluver Referring Physician: Alysia Penna Treating Physician/Extender: Skipper Cliche in Treatment: 29 Education Assessment Education Provided To: Patient Education Topics Provided Wound/Skin Impairment: Handouts: Other: continue wound care as directed Electronic Signature(s) Signed: 03/01/2022 5:04:43 PM By: Massie Kluver Entered By: Massie Kluver on 02/27/2022 09:45:57 Sapia, Ruvim W. ([id number]) -------------------------------------------------------------------------------- Wound Assessment Details Patient Name: Hammersmith, Giang W. Date of Service: 02/27/2022 9:00 AM Medical Record Number: [id number] Patient Account Number: [id number] Date of Birth/Sex: 01-02-1956 (66 y.o. M) Treating RN: Cornell Barman Primary Care Henok Heacock: Alysia Penna Other Clinician: Massie Kluver Referring Sopheap Boehle: Alysia Penna Treating Claudette Wermuth/Extender: Jeri Cos Weeks in Treatment: 29 Wound Status Wound Number: 1 Primary Etiology: Diabetic Wound/Ulcer  of the Lower Extremity Wound Location: Left Calcaneus Wound Status: Open Wounding Event: Pressure Injury Comorbid Cataracts, Hypertension, Type II Diabetes, History: Neuropathy Date Acquired: 06/19/2021 Weeks Of Treatment: 29 Clustered Wound: No Photos Wound Measurements Length: (cm) 1.8 Width: (cm) 2.5 Depth: (cm) 0.2 Area: (cm) 3.534 Volume: (cm) 0.707 % Reduction in Area: -55.7% % Reduction in Volume: -211.5% Epithelialization: Small (1-33%) Tunneling: No Undermining: No Wound Description Classification: Grade 2 Exudate Amount: Medium Exudate Type:  Serosanguineous Exudate Color: red, brown Foul Odor After Cleansing: No Slough/Fibrino Yes Wound Bed Granulation Amount: Medium (34-66%) Exposed Structure Granulation Quality: Red, Hyper-granulation Fat Layer (Subcutaneous Tissue) Exposed: Yes Necrotic Amount: Medium (34-66%) Necrotic Quality: Adherent Slough Treatment Notes Wound #1 (Calcaneus) Wound Laterality: Left Cleanser Normal Saline Discharge Instruction: Wash your hands with soap and water. Remove old dressing, discard into plastic bag and place into trash. Cleanse the wound with Normal Saline prior to applying a clean dressing using gauze sponges, not tissues or cotton balls. Do not scrub or use excessive force. Pat dry using gauze sponges, not tissue or cotton balls. Peri-Wound Care Topical Vollman, Ardean W. ([id number]) Primary Dressing Prisma 4.34 (in) Discharge Instruction: Moisten w/normal saline or sterile water; Cover wound as directed. Do not remove from wound bed. Secondary Dressing Drawtex Hydroconductive Wound Dressing,o2x2 (in/in) Discharge Instruction: Apply to wound with drainage. (BORDER) Zetuvit Plus SILICONE BORDER Dressing 5x5 (in/in) Discharge Instruction: Please do not put silicone bordered dressings under wraps. Use non-bordered dressing only. Secured With Compression Wrap Compression Stockings Facilities manager) Signed: 02/27/2022 5:08:52 PM By: Elliot Gurney, BSN, RN, CWS, Kim RN, BSN Signed: 03/01/2022 5:04:43 PM By: Betha Loa Entered By: Betha Loa on 02/27/2022 09:19:29 Lampton, Yug W. ([id number]) -------------------------------------------------------------------------------- Vitals Details Patient Name: Duquette, Ricard W. Date of Service: 02/27/2022 9:00 AM Medical Record Number: [id number] Patient Account Number: [id number] Date of Birth/Sex: [birth year] (66 y.o. M) Treating RN: Huel Coventry Primary Care Arrion Burruel: [name] Other Clinician: Betha Loa Referring Dashiell Franchino: [name] Treating Fleurette Woolbright/Extender: Rowan Blase in Treatment: 29 Vital Signs Time Taken: 09:10 Temperature (F): 98.1 Height (in): 68 Pulse (bpm): 77 Weight (lbs): 220 Respiratory Rate (breaths/min): 18 Body Mass Index (BMI): 33.4 Blood Pressure (mmHg): 181/92 Reference Range: 80 - 120 mg / dl Electronic Signature(s) Signed: 03/01/2022 5:04:43 PM By: Betha Loa Entered By: Betha Loa on 02/27/2022 09:12:27

## 2022-03-06 ENCOUNTER — Encounter: Payer: No Typology Code available for payment source | Admitting: Physician Assistant

## 2022-03-06 DIAGNOSIS — E11621 Type 2 diabetes mellitus with foot ulcer: Secondary | ICD-10-CM | POA: Diagnosis not present

## 2022-03-06 NOTE — Progress Notes (Signed)
Gerhold, [name] ([id number]) Visit Report for 03/06/2022 Arrival Information Details Patient Name: [name], [name] Date of Service: 03/06/2022 8:15 AM Medical Record Number: [id number] Patient Account Number: [id number] Date of Birth/Sex: 08-Aug-1955 (66 y.o. M) Treating RN: Carlene Coria Primary Care Viha Kriegel: Alysia Penna Other Clinician: Massie Kluver Referring Byrd Terrero: Alysia Penna Treating Darlys Buis/Extender: Skipper Cliche in Treatment: 10 Visit Information History Since Last Visit All ordered tests and consults were completed: No Patient Arrived: [name] Added or deleted any medications: No Arrival Time: 08:13 Any new allergies or adverse reactions: No Accompanied By: wife Had a fall or experienced change in No Transfer Assistance: None activities of daily living that may affect Patient Identification Verified: Yes risk of falls: Secondary Verification Process Completed: Yes Signs or symptoms of abuse/neglect since last visito No Patient Requires Transmission-Based No Hospitalized since last visit: No Precautions: Implantable device outside of the clinic excluding No Patient Has Alerts: Yes cellular tissue based products placed in the center Patient Alerts: ABI R >1.0 [place] TBI since last visit: 0.50 Has Dressing in Place as Prescribed: Yes ABI L 0.98 Pain Present Now: No Electronic Signature(s) Signed: 03/06/2022 2:31:54 PM By: Carlene Coria RN Entered By: Carlene Coria on 03/06/2022 08:17:04 Spillane, Levoy W. ([id number]) -------------------------------------------------------------------------------- Clinic Level of Care Assessment Details Patient Name: [name], [name] Date of Service: 03/06/2022 8:15 AM Medical Record Number: [id number] Patient Account Number: [id number] Date of Birth/Sex: [birth year] (66 y.o. M) Treating RN: Carlene Coria Primary Care Millena Callins: Alysia Penna Other Clinician: Massie Kluver Referring Marvena Tally: Alysia Penna Treating Angelo Caroll/Extender: Skipper Cliche in Treatment: 30 Clinic Level of Care Assessment Items TOOL 1 Quantity Score []  - Use when EandM and Procedure is performed on INITIAL visit 0 ASSESSMENTS - Nursing Assessment / Reassessment []  - General Physical Exam (combine w/ comprehensive assessment (listed just below) when performed on new 0 pt. evals) []  - 0 Comprehensive Assessment (HX, ROS, Risk Assessments, Wounds Hx, etc.) ASSESSMENTS - Wound and Skin Assessment / Reassessment []  - Dermatologic / Skin Assessment (not related to wound area) 0 ASSESSMENTS - Ostomy and/or Continence Assessment and Care []  - Incontinence Assessment and Management 0 []  - 0 Ostomy Care Assessment and Management (repouching, etc.) PROCESS - Coordination of Care []  - Simple Patient / Family Education for ongoing care 0 []  - 0 Complex (extensive) Patient / Family Education for ongoing care []  - 0 Staff obtains Programmer, systems, Records, Test Results / Process Orders []  - 0 Staff telephones HHA, Nursing Homes / Clarify orders / etc []  - 0 Routine Transfer to another Facility (non-emergent condition) []  - 0 Routine Hospital Admission (non-emergent condition) []  - 0 New Admissions / Biomedical engineer / Ordering NPWT, Apligraf, etc. []  - 0 Emergency Hospital Admission (emergent condition) PROCESS - Special Needs []  - Pediatric / Minor Patient Management 0 []  - 0 Isolation Patient Management []  - 0 Hearing / Language / Visual special needs []  - 0 Assessment of Community assistance (transportation, D/C planning, etc.) []  - 0 Additional assistance / Altered mentation []  - 0 Support Surface(s) Assessment (bed, cushion, seat, etc.) INTERVENTIONS - Miscellaneous []  - External ear exam 0 []  - 0 Patient Transfer (multiple staff / Civil Service fast streamer / Similar devices) []  - 0 Simple Staple / Suture removal (25 or less) []  - 0 Complex Staple / Suture removal (26 or more) []  - 0 Hypo/Hyperglycemic Management (do not check if billed  separately) []  - 0 Ankle / Brachial Index (ABI) - do not check if billed separately Has the patient  been seen at the hospital within the last three years: Yes Total Score: 0 Level Of Care: ____ [name] ([id number]) Electronic Signature(s) Signed: 03/06/2022 9:44:14 AM By: Massie Kluver Entered By: Massie Kluver on 03/06/2022 08:56:06 Casso, Pellegrino W. ([id number]) -------------------------------------------------------------------------------- Encounter Discharge Information Details Patient Name: [name], [name] Date of Service: 03/06/2022 8:15 AM Medical Record Number: [id number] Patient Account Number: [id number] Date of Birth/Sex: January 20, 1956 (66 y.o. M) Treating RN: Carlene Coria Primary Care Rowyn Mustapha: Alysia Penna Other Clinician: Massie Kluver Referring Carvel Huskins: Alysia Penna Treating Delanie Tirrell/Extender: Skipper Cliche in Treatment: 30 Encounter Discharge Information Items Post Procedure Vitals Discharge Condition: Stable Temperature (F): 98.0 Ambulatory Status: Ambulatory Pulse (bpm): 73 Discharge Destination: Home Respiratory Rate (breaths/min): 18 Transportation: Private Auto Blood Pressure (mmHg): 175/76 Accompanied By: wife Schedule Follow-up Appointment: No Clinical Summary of Care: Electronic Signature(s) Signed: 03/06/2022 9:44:14 AM By: Massie Kluver Entered By: Massie Kluver on 03/06/2022 08:57:24 Honse, Aundrea W. ([id number]) -------------------------------------------------------------------------------- Lower Extremity Assessment Details Patient Name: [name], [name] Date of Service: 03/06/2022 8:15 AM Medical Record Number: [id number] Patient Account Number: [id number] Date of Birth/Sex: 02/22/1956 (66 y.o. M) Treating RN: Carlene Coria Primary Care Jailyne Chieffo: Alysia Penna Other Clinician: Massie Kluver Referring Dameon Soltis: Alysia Penna Treating Athanasia Stanwood/Extender: Jeri Cos Weeks in Treatment: 30 Edema Assessment Assessed: Shirlyn Goltz: No] [Right:  No] Edema: [Left: Ye] [Right: s] Calf Left: Right: Point of Measurement: 30 cm From Medial Instep 35.5 cm Ankle Left: Right: Point of Measurement: 10 cm From Medial Instep 25.5 cm Vascular Assessment Pulses: Dorsalis Pedis Palpable: [Left:Yes] Electronic Signature(s) Signed: 03/06/2022 2:31:54 PM By: Carlene Coria RN Entered By: Carlene Coria on 03/06/2022 08:21:27 Fritzsche, Seena W. ([id number]) -------------------------------------------------------------------------------- Multi Wound Chart Details Patient Name: Salvino, Azaiah W. Date of Service: 03/06/2022 8:15 AM Medical Record Number: [id number] Patient Account Number: [id number] Date of Birth/Sex: Apr 01, 1956 (66 y.o. M) Treating RN: Carlene Coria Primary Care Zelpha Messing: Alysia Penna Other Clinician: Massie Kluver Referring Ollie Esty: Alysia Penna Treating [place]/Extender: Skipper Cliche in Treatment: 30 Vital Signs Height(in): 68 Pulse(bpm): 10 Weight(lbs): 220 Blood Pressure(mmHg): 175/76 Body Mass Index(BMI): 33.4 Temperature(F): 98 Respiratory Rate(breaths/min): 18 Photos: [N/A:N/A] Wound Location: Left Calcaneus N/A N/A Wounding Event: Pressure Injury N/A N/A Primary Etiology: Diabetic Wound/Ulcer of the Lower N/A N/A Extremity Comorbid History: Cataracts, Hypertension, Type II N/A N/A Diabetes, Neuropathy Date Acquired: 06/19/2021 N/A N/A Weeks of Treatment: 30 N/A N/A Wound Status: Open N/A N/A Wound Recurrence: No N/A N/A Measurements L x W x D (cm) 2x3x0.2 N/A N/A Area (cm) : 4.712 N/A N/A Volume (cm) : 0.942 N/A N/A % Reduction in Area: -107.60% N/A N/A % Reduction in Volume: -315.00% N/A N/A Classification: Grade 2 N/A N/A Exudate Amount: Medium N/A N/A Exudate Type: Serosanguineous N/A N/A Exudate Color: red, brown N/A N/A Granulation Amount: Medium (34-66%) N/A N/A Granulation Quality: Red, Hyper-granulation N/A N/A Necrotic Amount: Medium (34-66%) N/A N/A Exposed Structures: Fat Layer  (Subcutaneous Tissue): N/A N/A Yes Epithelialization: Small (1-33%) N/A N/A Treatment Notes Electronic Signature(s) Signed: 03/06/2022 2:31:54 PM By: Carlene Coria RN Entered By: Carlene Coria on 03/06/2022 08:21:39 Mcswain, Luisantonio W. ([id number]) -------------------------------------------------------------------------------- [place] Details Patient Name: Marques, Achille W. Date of Service: 03/06/2022 8:15 AM Medical Record Number: [id number] Patient Account Number: [id number] Date of Birth/Sex: 1955/08/01 (66 y.o. M) Treating RN: Carlene Coria Primary Care Addi Pak: Alysia Penna Other Clinician: Massie Kluver Referring Esli Clements: Alysia Penna Treating Nysa Sarin/Extender: Skipper Cliche in Treatment: 30 Active Inactive Wound/Skin Impairment Nursing Diagnoses: Impaired tissue integrity Knowledge deficit related to ulceration/compromised skin  integrity Goals: Ulcer/skin breakdown will have a volume reduction of 30% by week 4 Date Initiated: 08/08/2021 Date Inactivated: 12/19/2021 Target Resolution Date: 09/05/2021 Goal Status: Unmet Unmet Reason: comorbities Ulcer/skin breakdown will have a volume reduction of 50% by week 8 Date Initiated: 08/08/2021 Date Inactivated: 12/19/2021 Target Resolution Date: 10/03/2021 Goal Status: Unmet Unmet Reason: comorbities Ulcer/skin breakdown will have a volume reduction of 80% by week 12 Date Initiated: 08/08/2021 Date Inactivated: 12/19/2021 Target Resolution Date: 10/31/2021 Goal Status: Unmet Unmet Reason: comorbities Ulcer/skin breakdown will heal within 14 weeks Date Initiated: 08/08/2021 Target Resolution Date: 11/14/2021 Goal Status: Active Interventions: Assess patient/caregiver ability to obtain necessary supplies Assess patient/caregiver ability to perform ulcer/skin care regimen upon admission and as needed Assess ulceration(s) every visit Provide education on ulcer and skin care Notes: Electronic Signature(s) Signed:  03/06/2022 2:31:54 PM By: Carlene Coria RN Entered By: Carlene Coria on 03/06/2022 08:21:31 Heavner, [place] W. ([id number]) -------------------------------------------------------------------------------- Pain Assessment Details Patient Name: Narasimhan, Altan W. Date of Service: 03/06/2022 8:15 AM Medical Record Number: [id number] Patient Account Number: [id number] Date of Birth/Sex: [birth year] (66 y.o. M) Treating RN: Carlene Coria Primary Care Rayetta Veith: Alysia Penna Other Clinician: Massie Kluver Referring Roald Lukacs: Alysia Penna Treating Isobella Ascher/Extender: Skipper Cliche in Treatment: 30 Active Problems Location of Pain Severity and Description of Pain Patient Has Paino No Site Locations Pain Management and Medication Current Pain Management: Electronic Signature(s) Signed: 03/06/2022 2:31:54 PM By: Carlene Coria RN Entered By: Carlene Coria on 03/06/2022 08:17:31 Cerny, Vidyuth W. ([id number]) -------------------------------------------------------------------------------- Patient/Caregiver Education Details Patient Name: Fendley, Akshith W. Date of Service: 03/06/2022 8:15 AM Medical Record Number: [id number] Patient Account Number: [id number] Date of Birth/Gender: December 20, 1955 (66 y.o. M) Treating RN: Carlene Coria Primary Care Physician: Alysia Penna Other Clinician: Massie Kluver Referring Physician: Alysia Penna Treating Physician/Extender: Skipper Cliche in Treatment: 26 Education Assessment Education Provided To: Patient Education Topics Provided Wound/Skin Impairment: Handouts: Other: continue wound care as directed Methods: Explain/Verbal Responses: State content correctly Electronic Signature(s) Signed: 03/06/2022 9:44:14 AM By: Massie Kluver Entered By: Massie Kluver on 03/06/2022 08:56:41 Didion, Javarus W. ([id number]) -------------------------------------------------------------------------------- Wound Assessment Details Patient Name: Dillehay, Efe W. Date of Service: 03/06/2022  8:15 AM Medical Record Number: [id number] Patient Account Number: [id number] Date of Birth/Sex: Oct 22, 1955 (67 y.o. M) Treating RN: Carlene Coria Primary Care Kattleya Kuhnert: Alysia Penna Other Clinician: Massie Kluver Referring Nhyira Leano: Alysia Penna Treating Yoshi Mancillas/Extender: Skipper Cliche in Treatment: 30 Wound Status Wound Number: 1 Primary Etiology: Diabetic Wound/Ulcer of the Lower Extremity Wound Location: Left Calcaneus Wound Status: Open Wounding Event: Pressure Injury Comorbid Cataracts, Hypertension, Type II Diabetes, History: Neuropathy Date Acquired: 06/19/2021 Weeks Of Treatment: 30 Clustered Wound: No Photos Wound Measurements Length: (cm) 2 Width: (cm) 3 Depth: (cm) 0.2 Area: (cm) 4.712 Volume: (cm) 0.942 % Reduction in Area: -107.6% % Reduction in Volume: -315% Epithelialization: Small (1-33%) Tunneling: No Undermining: No Wound Description Classification: Grade 2 Exudate Amount: Medium Exudate Type: Serosanguineous Exudate Color: red, brown Foul Odor After Cleansing: No Slough/Fibrino Yes Wound Bed Granulation Amount: Medium (34-66%) Exposed Structure Granulation Quality: Red, Hyper-granulation Fat Layer (Subcutaneous Tissue) Exposed: Yes Necrotic Amount: Medium (34-66%) Necrotic Quality: Adherent Slough Treatment Notes Wound #1 (Calcaneus) Wound Laterality: Left Cleanser Normal Saline Discharge Instruction: Wash your hands with soap and water. Remove old dressing, discard into plastic bag and place into trash. Cleanse the wound with Normal Saline prior to applying a clean dressing using gauze sponges, not tissues or cotton balls. Do not scrub or use excessive force. Pat dry using gauze sponges,  not tissue or cotton balls. Peri-Wound Care Topical Richison, Luiscarlos W. ([id number]) Primary Dressing Grafix Prime Discharge Instruction: #1 Secondary Dressing Drawtex Hydroconductive Wound Dressing,o2x2 (in/in) Discharge Instruction: Apply to wound with  drainage. (BORDER) Zetuvit Plus SILICONE BORDER Dressing 5x5 (in/in) Discharge Instruction: Please do not put silicone bordered dressings under wraps. Use non-bordered dressing only. Secured With Compression Wrap Compression Stockings Environmental education officer) Signed: 03/06/2022 2:31:54 PM By: Carlene Coria RN Entered By: Carlene Coria on 03/06/2022 08:20:37 Oren, Anson W. ([id number]) -------------------------------------------------------------------------------- Vitals Details Patient Name: Shein, Danzell W. Date of Service: 03/06/2022 8:15 AM Medical Record Number: [id number] Patient Account Number: [id number] Date of Birth/Sex: [birth year] (66 y.o. M) Treating RN: Carlene Coria Primary Care Carloyn Lahue: Alysia Penna Other Clinician: Massie Kluver Referring Stanely Sexson: Alysia Penna Treating Jerrine Urschel/Extender: Skipper Cliche in Treatment: 30 Vital Signs Time Taken: 08:17 Temperature (F): 98 Height (in): 68 Pulse (bpm): 73 Weight (lbs): 220 Respiratory Rate (breaths/min): 18 Body Mass Index (BMI): 33.4 Blood Pressure (mmHg): 175/76 Reference Range: 80 - 120 mg / dl Electronic Signature(s) Signed: 03/06/2022 2:31:54 PM By: Carlene Coria RN Entered By: Carlene Coria on 03/06/2022 08:17:20

## 2022-03-06 NOTE — Progress Notes (Addendum)
[name] ([id number]) Visit Report for 03/06/2022 Chief Complaint Document Details Patient Name: [name], [name] Date of Service: 03/06/2022 8:15 AM Medical Record Number: [id number] Patient Account Number: [id number] Date of Birth/Sex: Nov 13, 1955 (66 y.o. M) Treating RN: Yevonne Pax Primary Care Provider: [name] Other Clinician: Betha Loa Referring Provider: [name] Treating Provider/Extender: Rowan Blase in Treatment: 30 Information Obtained from: Patient Chief Complaint Left heel ulcer Electronic Signature(s) Signed: 03/06/2022 8:17:34 AM By: Lenda Kelp PA-C Entered By: Lenda Kelp on 03/06/2022 08:17:34 Borromeo, Braylen W. ([id number]) -------------------------------------------------------------------------------- Cellular or Tissue Based Product Details Patient Name: [name], [name] Date of Service: 03/06/2022 8:15 AM Medical Record Number: [id number] Patient Account Number: [id number] Date of Birth/Sex: 1955/10/14 (66 y.o. M) Treating RN: Yevonne Pax Primary Care Provider: [name] Other Clinician: Betha Loa Referring Provider: [name] Treating Provider/Extender: Rowan Blase in Treatment: 30 Cellular or Tissue Based Product Type Wound #1 Left Calcaneus Applied to: Performed By: Physician Nelida Meuse., PA-C Cellular or Tissue Based Product Grafix prime Type: Level of Consciousness (Pre- Awake and Alert procedure): Pre-procedure Verification/Time Out Yes - 08:40 Taken: Location: genitalia / hands / feet / multiple digits Wound Size (sq cm): 6 Product Size (sq cm): 6 Waste Size (sq cm): 0 Amount of Product Applied (sq cm): 6 Lot #: [id number] Order #: 1 Expiration Date: 07/09/2023 Fenestrated: No Reconstituted: Yes Solution Type: normal saline Solution Amount: 5 ml Lot #: [id number] Solution Expiration Date: 08/07/2023 Secured: Yes Secured With: Steri-Strips Dressing Applied: Yes Primary Dressing: Drawtex Procedural  Pain: 0 Post Procedural Pain: 0 Response to Treatment: Procedure was tolerated well Level of Consciousness (Post- Awake and Alert procedure): Post Procedure Diagnosis Same as Pre-procedure Electronic Signature(s) Signed: 03/06/2022 9:44:14 AM By: Betha Loa Entered By: Betha Loa on 03/06/2022 08:46:52 Taormina, Decorian W. ([id number]) -------------------------------------------------------------------------------- Debridement Details Patient Name: [name], [name] Date of Service: 03/06/2022 8:15 AM Medical Record Number: [id number] Patient Account Number: [id number] Date of Birth/Sex: 1956-04-20 (66 y.o. M) Treating RN: Yevonne Pax Primary Care Provider: [name] Other Clinician: Betha Loa Referring Provider: [name] Treating Provider/Extender: Rowan Blase in Treatment: 30 Debridement Performed for Wound #1 Left Calcaneus Assessment: Performed By: Physician Nelida Meuse., PA-C Debridement Type: Debridement Severity of Tissue Pre Debridement: Fat layer exposed Level of Consciousness (Pre- Awake and Alert procedure): Pre-procedure Verification/Time Out Yes - 08:32 Taken: Start Time: 08:32 Total Area Debrided (L x W): 2 (cm) x 3 (cm) = 6 (cm) Tissue and other material Viable, Non-Viable, Slough, Subcutaneous, Slough debrided: Level: Skin/Subcutaneous Tissue Debridement Description: Excisional Instrument: Curette Bleeding: Minimum Hemostasis Achieved: Pressure End Time: 08:36 Response to Treatment: Procedure was tolerated well Level of Consciousness (Post- Awake and Alert procedure): Post Debridement Measurements of Total Wound Length: (cm) 2 Width: (cm) 3 Depth: (cm) 0.2 Volume: (cm) 0.942 Character of Wound/Ulcer Post Debridement: Stable Severity of Tissue Post Debridement: Fat layer exposed Post Procedure Diagnosis Same as Pre-procedure Electronic Signature(s) Signed: 03/06/2022 9:44:14 AM By: Betha Loa Signed: 03/06/2022 2:31:54 PM  By: Yevonne Pax RN Signed: 03/06/2022 4:49:05 PM By: Lenda Kelp PA-C Entered By: Betha Loa on 03/06/2022 08:38:10 Brownlow, Betty W. ([id number]) -------------------------------------------------------------------------------- HPI Details Patient Name: [name], [name] Date of Service: 03/06/2022 8:15 AM Medical Record Number: [id number] Patient Account Number: [id number] Date of Birth/Sex: 01-Oct-1955 (66 y.o. M) Treating RN: Yevonne Pax Primary Care Provider: [name] Other Clinician: Betha Loa Referring Provider: [name] Treating Provider/Extender: Rowan Blase in Treatment: 30 History of Present  Illness HPI Description: 08/08/2021 upon evaluation today patient appears to be doing somewhat poorly in regard to a heel ulcer that began as a result of the patient tells me him trying to get his foot into his shoes that were somewhat tight. This was around 4 January. He was using a shoehorn fairly aggressively and thinks that he may have caused the bruising at that time. Fortunately there does not appear to be any signs of infection at this point. Nonetheless he does have some definite necrotic tissue. This can need to be cleared away. Nonetheless unfortunately [name] we do not have clearance in my opinion therefore to go forward with an aggressive sharp debridement. Patient does have diabetes mellitus type 2 with the most recent hemoglobin A1c of 5.2 and that was on June 2022. He also has a history of chronic venous insufficiency, peripheral vascular disease, and hypertension. Obviously with regard to the peripheral vascular disease we will continue to send him to vascular to quantify his ABIs and TBI's. 08/22/2021 upon evaluation today patient appears to be doing well with regard to his wound. This is definitely showing some signs of improvement which is great news and overall very pleased with where things stand today. There does not appear to be any  evidence of active infection locally nor systemically which is great news and overall I feel like that this is showing signs of improvement. It is definitely much less irritated as compared to last week. I am very pleased in that regard I am good to be able to get a lot of this dry skin off today as well which is great news. 09/05/2021 upon evaluation today patient's heel actually showing signs of some improvement though there is still a bit of eschar noted in the center part of the wound. I do believe this is going require some sharp debridement to clear this away. The Iodosorb just is not quite cutting it. Fortunately there does not appear to be any signs of active infection at this time locally nor systemically. 09/12/2021 upon evaluation today patient appears to be doing well with regard to his wound. He has been tolerating the dressing changes I do believe that clearing off the eschar last week has been to help there is still some necrotic tissue removed today overall he seems to be doing quite well however. 09/20/2021 upon evaluation today patient appears to be doing well with regard to his wound he does have some necrotic tissue noted on the surface of the wound but I do feel like we are getting better here definitely making good progress. I do not see any signs of infection which is great news and overall I think that we are on the right track. 09-26-2021 upon evaluation today patient appears to be doing well with regard to his heel ulcer. This is actually measuring smaller and looking better today. Fortunately I do not see any evidence of active infection locally or systemically which is great news. 10-03-2021 upon evaluation today patient appears to be doing well currently in regard to his heel for the most part although he does have a little bit of what appears to be bruising in the base of the wound. I am not sure exactly where to get the pressure from but this very likely is happening when he  is walking he tells me has been wearing some other shoes been a little bit more on his feet than normal. Fortunately I do not see any evidence of active  infection at this time. I have contemplated put him in a different type of shoe or even a total contact cast to be honest. With that being said his balance is not good and I therefore have some concerns in that regard. 10-17-2021 upon evaluation today patient appears to be doing poorly in regard to his heel. Again this is showing signs of significant deep tissue injury which is bringing into question whether or not he may actually have something going on here which is more related to his blood flow. We have previously done arterial study though to be honest with ABIs right at 1 and noncompressible which tends to make me think this may be falsely elevated coupled with TBI's which were at 0.5 with biphasic waveforms I am not certain he is getting the blood flow needed to completely heal this wound. I discussed with him today that he may need to see vascular in order to discuss options for her further angiogram/evaluation in general. I would at least leave it up to them as to whether they feel the angiogram is necessary or not. Nonetheless I do believe pressure is also playing a component to this he does tell me he started sleeping with his heel on the bed not raised underneath the pillow already this was dark and discolored that may have been what triggered this to get worse I am not really sure. 5/8; the wound seems to have less of a deep tissue injury but there is still eschar. Scant amount of purulent drainage some erythema medially extending up the heel. He has neuropathy no pain no systemic illness. Culture that we did last time showed moderate Proteus, moderate Enterococcus and moderate Pseudomonas. The Enterococcus will require ampicillin so he will require 2 antibiotics. He is using Santyl to the wound 10-31-2021 upon evaluation today patient  appears to be doing well with regard to his wound all things considered. There is still necrotic tissue noted in the central portion of the wound which is not what we want to see but unfortunately I think this is just could be the case until we ensure he has good blood flow and can subsequently get things moving in a better direction. Fortunately I do not see any evidence of infection locally or systemically which is great news and overall I think that we are on the right track in that regard. I am going to go ahead and refill the Augmentin as well as the Cipro for him today he was given originally 10 days worth I would have her extend that for 7 days. 11-07-2021 upon evaluation today patient appears to be doing better in regard to the overall appearance of his wound and I do feel like the Melburn Popper is doing a good job here. Fortunately there does not appear to be any evidence of active infection locally nor systemically which is great news and overall I am extremely pleased with where things stand today. The patient is happy as well with the progress. Nonetheless I had extended his Augmentin and Cipro for him at the last visit I think that is going to need to be extended a bit further. That means he is probably can need some probiotics as well. 11-21-2021 upon evaluation today patient appears to be doing a little better in regard to size and is definitely making some progress here. With that being said he also definitely has some areas that are still fairly thick with necrotic tissue. We discussed debridement yet again today and he is  Armel, Prakash W. ([id number]) amenable to doing some debridement as [place] as its not too aggressive which I think we can definitely do. I do believe the Melburn Popper is doing a great job and will definitely be continuing that as well. 12-05-2021 upon evaluation today patient appears to be doing well with regard to his heel. He has been tolerating the dressing changes  without complication. Fortunately there does not appear to be any evidence of active infection locally or systemically at this time. No fevers, chills, nausea, vomiting, or diarrhea. 12-19-2021 upon evaluation today patient appears to be doing excellent in regard to his wound. Has been tolerating the Santyl in an excellent fashion. Fortunately I do not see any evidence of infection locally or systemically which is great news and overall I am very pleased with where we stand today. 01-02-2022 upon evaluation today patient appears to be doing excellent in regard to his wound. He has been tolerating the dressing changes without complication. Fortunately I do not see any evidence of active infection there is minimal amount of slough and biofilm buildup which I Minna perform debridement in regard to today but other than this overall I really feel like that the patient is doing excellent and I think he is ready to switch to a collagen based dressing. I think this is going to do well for him. 01-19-2022 upon evaluation today patient appears to be doing much better in regard to his wound. He has been tolerating the dressing changes without complication. Fortunately there does not appear to be any evidence of active infection locally or systemically at this time which is great news and overall I am extremely pleased with where we stand. There does not appear to be any evidence of active infection whatsoever which is great news. No fevers, chills, nausea, vomiting, or diarrhea. 01-30-2022 upon evaluation today patient appears to be doing well currently in regard to his wound. This is actually showing signs of excellent improvement I am very pleased based on what we are seeing today. I do not see any evidence of active infection locally or systemically at this time. 02-13-2022 upon evaluation today patient appears to be doing excellent in regard to his wound. This is showing signs of a little bit of  maceration otherwise it seems to be a little bit smaller which is great news. Overall very pleased with where things stand at this point. 02-27-2022 upon evaluation today patient appears to be doing well currently in regard to his wound. He has been tolerating the dressing changes without complication. Fortunately I see no evidence of active infection locally or systemically at this time which is great news. No fevers, chills, nausea, vomiting, or diarrhea. 03-06-2022 upon evaluation today patient appears to be doing excellent in regard to his wound. In fact this is showing signs of improvement even compared to the last time I saw him. I am very pleased in that regard. Fortunately I do not see any evidence of active infection locally or systemically at this time which is great news. No fevers, chills, nausea, vomiting, or diarrhea. We do have the first graphics available for application today. Electronic Signature(s) Signed: 03/06/2022 10:20:53 AM By: Lenda Kelp PA-C Entered By: Lenda Kelp on 03/06/2022 10:20:53 Zorn, Kayla W. ([id number]) -------------------------------------------------------------------------------- Physical Exam Details Patient Name: Guild, Yarnell W. Date of Service: 03/06/2022 8:15 AM Medical Record Number: [id number] Patient Account Number: [id number] Date of Birth/Sex: [birth year] (66 y.o. M) Treating RN: Yevonne Pax Primary Care Provider: [name] Other  Clinician: Betha Loa Referring Provider: [name] Treating Provider/Extender: Rowan Blase in Treatment: 30 Constitutional Well-nourished and well-hydrated in no acute distress. Respiratory normal breathing without difficulty. Psychiatric this patient is able to make decisions and demonstrates good insight into disease process. Alert and Oriented x 3. pleasant and cooperative. Notes Upon inspection patient's wound bed actually showed signs of good granulation and epithelization at this point.  Fortunately I see no evidence of active infection locally or systemically which is great news and overall I am extremely pleased with where we stand. I did perform debridement of clearway some of the necrotic debris tolerated that without complication postdebridement wound bed appears to be doing much better which is great news and in general I do think we are on the right track here. Electronic Signature(s) Signed: 03/06/2022 10:21:23 AM By: Lenda Kelp PA-C Entered By: Lenda Kelp on 03/06/2022 10:21:23 Altemose, Godric W. ([id number]) -------------------------------------------------------------------------------- Physician Orders Details Patient Name: Si, Kaisyn W. Date of Service: 03/06/2022 8:15 AM Medical Record Number: [id number] Patient Account Number: [id number] Date of Birth/Sex: 08/31/1955 (66 y.o. 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 o Nurse Visit as needed [facility] o Wash wounds with antibacterial soap and water. o May shower; gently cleanse wound with antibacterial soap, rinse and pat dry prior to dressing wounds - shower with dressing on and remove after shower o No tub bath. Anesthetic (Use 'Patient Medications' Section for Anesthetic Order Entry) o Lidocaine applied to wound bed Cellular or Tissue Based Products o Cellular or  Tissue Based Product Type: - grafix PL Prime #1 applied Edema Control - Lymphedema / Segmental Compressive Device / Other o Elevate, Exercise Daily and Avoid Standing for Mapps Periods of Time. o Elevate leg(s) parallel to the floor when sitting. o DO YOUR BEST to sleep in the bed at night. DO NOT sleep in your recliner. Reffner hours of sitting in a recliner leads to swelling of the legs and/or potential wounds on your backside. Wound Treatment Wound #1 - Calcaneus Wound Laterality: Left Cleanser: Normal Saline 3 x Per Week/30 Days Discharge Instructions: Wash your hands with soap and water. Remove old dressing, discard into plastic bag and place into trash. Cleanse the wound with Normal Saline prior to applying a clean dressing using gauze sponges, not tissues or cotton balls. Do not scrub or use excessive force. Pat dry using gauze sponges, not tissue or cotton balls. Primary Dressing: Grafix Prime 3 x Per Week/30 Days Discharge Instructions: #1 Secondary Dressing: Drawtex Hydroconductive Wound Dressing,o2x2 (in/in) (Generic) 3 x Per Week/30 Days Discharge Instructions: Apply to wound with drainage. Secondary Dressing: (BORDER) Zetuvit Plus SILICONE BORDER Dressing 5x5 (in/in) (Generic) 3 x Per Week/30 Days Discharge Instructions: Please do not put silicone bordered dressings under wraps. Use non-bordered dressing only. Electronic Signature(s) Signed: 03/06/2022 9:44:14 AM By: Betha Loa Signed: 03/06/2022 4:49:05 PM By: Lenda Kelp PA-C Entered By: Betha Loa on 03/06/2022 08:49:16 Pennings, Jahkari W. ([id number]) [place]usta, Marvel W. ([id number]) -------------------------------------------------------------------------------- Problem List Details Patient Name: Mikesell,  Kerrigan W. Date of Service: 03/06/2022 8:15 AM Medical Record Number: [id number] Patient Account Number: [id number] Date of Birth/Sex: 1956-03-31 (66 y.o. M) Treating RN: Yevonne Pax Primary Care Provider: [name] Other Clinician: Betha Loa Referring Provider: [name] Treating Provider/Extender: Allen Derry Weeks in Treatment: 30 Active Problems ICD-10 Encounter Code Description Active Date MDM Diagnosis E11.621 Type 2 diabetes mellitus with foot ulcer 08/08/2021 No Yes L97.422 Non-pressure chronic ulcer of left heel and midfoot with fat layer 08/08/2021 No Yes exposed W09.811 Other chronic osteomyelitis, left ankle and foot 11/07/2021 No Yes I87.2 Venous insufficiency (chronic) (peripheral) 08/08/2021 No Yes I73.89 Other specified peripheral vascular diseases 08/08/2021 No Yes I10 Essential (primary) hypertension 08/08/2021 No Yes Inactive Problems Resolved Problems Electronic Signature(s) Signed: 03/06/2022 8:17:30 AM By: Lenda Kelp PA-C Entered By: Lenda Kelp on 03/06/2022 08:17:29 Hoffman, Tyrek W. ([id number]) -------------------------------------------------------------------------------- Progress Note Details Patient Name: Boccio, Jet W. Date of Service: 03/06/2022 8:15 AM Medical Record Number: [id number] Patient Account Number: [id number] Date of Birth/Sex: [birth year] (66 y.o. M) Treating RN: Yevonne Pax Primary Care Provider: [name] Other Clinician: Betha Loa Referring Provider: [name] Treating Provider/Extender: Rowan Blase in Treatment: 30 Subjective Chief Complaint Information obtained from Patient Left heel ulcer History of Present Illness (HPI) 08/08/2021 upon evaluation today patient appears to be doing somewhat poorly in regard to a heel ulcer that began as a result of the patient tells me him trying to get his foot into his shoes that were somewhat tight. This was around 4 January. He was using a shoehorn fairly aggressively and thinks that he may have caused the bruising at that time. Fortunately there does not appear to be any signs of infection at this point. Nonetheless he does have some definite necrotic tissue. This can need  to be cleared away. Nonetheless unfortunately [name] we do not have clearance in my opinion therefore to go forward with an aggressive sharp debridement. Patient does have diabetes mellitus type 2 with the most recent hemoglobin A1c of 5.2 and that was on June 2022. He also has a history of chronic venous insufficiency, peripheral vascular disease, and hypertension. Obviously with regard to the peripheral vascular disease we will continue to send him to vascular to quantify his ABIs and TBI's. 08/22/2021 upon evaluation today patient appears to be doing well with regard to his wound. This is definitely showing some signs of improvement which is great news and overall very pleased with where things stand today. There does not appear to be any evidence of active infection locally nor systemically which is great news and overall I feel like that this is showing signs of improvement. It is definitely much less irritated as compared to last week. I am very pleased in that regard I am good to be able to get a lot of this dry skin off today as well which is great news. 09/05/2021 upon evaluation today patient's heel actually showing signs of some improvement though there is still a bit of eschar noted in the center part of the wound. I do believe this is going require some sharp debridement to clear this away. The Iodosorb just is not quite cutting it. Fortunately there does not appear to be any signs of active infection at this time locally nor systemically. 09/12/2021 upon evaluation today patient appears to be doing well with regard to his wound. He has been tolerating the dressing changes I do believe that clearing off the eschar last week has been  to help there is still some necrotic tissue removed today overall he seems to be doing quite well however. 09/20/2021 upon evaluation today patient appears to be doing well with regard to his wound he does have some necrotic tissue noted on  the surface of the wound but I do feel like we are getting better here definitely making good progress. I do not see any signs of infection which is great news and overall I think that we are on the right track. 09-26-2021 upon evaluation today patient appears to be doing well with regard to his heel ulcer. This is actually measuring smaller and looking better today. Fortunately I do not see any evidence of active infection locally or systemically which is great news. 10-03-2021 upon evaluation today patient appears to be doing well currently in regard to his heel for the most part although he does have a little bit of what appears to be bruising in the base of the wound. I am not sure exactly where to get the pressure from but this very likely is happening when he is walking he tells me has been wearing some other shoes been a little bit more on his feet than normal. Fortunately I do not see any evidence of active infection at this time. I have contemplated put him in a different type of shoe or even a total contact cast to be honest. With that being said his balance is not good and I therefore have some concerns in that regard. 10-17-2021 upon evaluation today patient appears to be doing poorly in regard to his heel. Again this is showing signs of significant deep tissue injury which is bringing into question whether or not he may actually have something going on here which is more related to his blood flow. We have previously done arterial study though to be honest with ABIs right at 1 and noncompressible which tends to make me think this may be falsely elevated coupled with TBI's which were at 0.5 with biphasic waveforms I am not certain he is getting the blood flow needed to completely heal this wound. I discussed with him today that he may need to see vascular in order to discuss options for her further angiogram/evaluation in general. I would at least leave it up to them as to whether they feel the  angiogram is necessary or not. Nonetheless I do believe pressure is also playing a component to this he does tell me he started sleeping with his heel on the bed not raised underneath the pillow already this was dark and discolored that may have been what triggered this to get worse I am not really sure. 5/8; the wound seems to have less of a deep tissue injury but there is still eschar. Scant amount of purulent drainage some erythema medially extending up the heel. He has neuropathy no pain no systemic illness. Culture that we did last time showed moderate Proteus, moderate Enterococcus and moderate Pseudomonas. The Enterococcus will require ampicillin so he will require 2 antibiotics. He is using Santyl to the wound 10-31-2021 upon evaluation today patient appears to be doing well with regard to his wound all things considered. There is still necrotic tissue noted in the central portion of the wound which is not what we want to see but unfortunately I think this is just could be the case until we ensure he has good blood flow and can subsequently get things moving in a better direction. Fortunately I do not see any evidence of  infection locally or systemically which is great news and overall I think that we are on the right track in that regard. I am going to go ahead and refill the Augmentin as well as the Cipro for him today he was given originally 10 days worth I would have her extend that for 7 days. 11-07-2021 upon evaluation today patient appears to be doing better in regard to the overall appearance of his wound and I do feel like the Melburn PopperSantyl is doing a good job here. Fortunately there does not appear to be any evidence of active infection locally nor systemically which is great news and overall I am extremely pleased with where things stand today. The patient is happy as well with the progress. Nonetheless I had extended his Romo, Dontrey W. ([id number]) Augmentin and Cipro for him at the last  visit I think that is going to need to be extended a bit further. That means he is probably can need some probiotics as well. 11-21-2021 upon evaluation today patient appears to be doing a little better in regard to size and is definitely making some progress here. With that being said he also definitely has some areas that are still fairly thick with necrotic tissue. We discussed debridement yet again today and he is amenable to doing some debridement as Islam as its not too aggressive which I think we can definitely do. I do believe the Melburn PopperSantyl is doing a great job and will definitely be continuing that as well. 12-05-2021 upon evaluation today patient appears to be doing well with regard to his heel. He has been tolerating the dressing changes without complication. Fortunately there does not appear to be any evidence of active infection locally or systemically at this time. No fevers, chills, nausea, vomiting, or diarrhea. 12-19-2021 upon evaluation today patient appears to be doing excellent in regard to his wound. Has been tolerating the Santyl in an excellent fashion. Fortunately I do not see any evidence of infection locally or systemically which is great news and overall I am very pleased with where we stand today. 01-02-2022 upon evaluation today patient appears to be doing excellent in regard to his wound. He has been tolerating the dressing changes without complication. Fortunately I do not see any evidence of active infection there is minimal amount of slough and biofilm buildup which I Minna perform debridement in regard to today but other than this overall I really feel like that the patient is doing excellent and I think he is ready to switch to a collagen based dressing. I think this is going to do well for him. 01-19-2022 upon evaluation today patient appears to be doing much better in regard to his wound. He has been tolerating the dressing changes without complication. Fortunately there  does not appear to be any evidence of active infection locally or systemically at this time which is great news and overall I am extremely pleased with where we stand. There does not appear to be any evidence of active infection whatsoever which is great news. No fevers, chills, nausea, vomiting, or diarrhea. 01-30-2022 upon evaluation today patient appears to be doing well currently in regard to his wound. This is actually showing signs of excellent improvement I am very pleased based on what we are seeing today. I do not see any evidence of active infection locally or systemically at this time. 02-13-2022 upon evaluation today patient appears to be doing excellent in regard to his wound. This is showing signs of a little  bit of maceration otherwise it seems to be a little bit smaller which is great news. Overall very pleased with where things stand at this point. 02-27-2022 upon evaluation today patient appears to be doing well currently in regard to his wound. He has been tolerating the dressing changes without complication. Fortunately I see no evidence of active infection locally or systemically at this time which is great news. No fevers, chills, nausea, vomiting, or diarrhea. 03-06-2022 upon evaluation today patient appears to be doing excellent in regard to his wound. In fact this is showing signs of improvement even compared to the last time I saw him. I am very pleased in that regard. Fortunately I do not see any evidence of active infection locally or systemically at this time which is great news. No fevers, chills, nausea, vomiting, or diarrhea. We do have the first graphics available for application today. Objective Constitutional Well-nourished and well-hydrated in no acute distress. Vitals Time Taken: 8:17 AM, Height: 68 in, Weight: 220 lbs, BMI: 33.4, Temperature: 98 F, Pulse: 73 bpm, Respiratory Rate: 18 breaths/min, Blood Pressure: 175/76 mmHg. Respiratory normal breathing  without difficulty. Psychiatric this patient is able to make decisions and demonstrates good insight into disease process. Alert and Oriented x 3. pleasant and cooperative. General Notes: Upon inspection patient's wound bed actually showed signs of good granulation and epithelization at this point. Fortunately I see no evidence of active infection locally or systemically which is great news and overall I am extremely pleased with where we stand. I did perform debridement of clearway some of the necrotic debris tolerated that without complication postdebridement wound bed appears to be doing much better which is great news and in general I do think we are on the right track here. Integumentary (Hair, Skin) Wound #1 status is Open. Original cause of wound was Pressure Injury. The date acquired was: 06/19/2021. The wound has been in treatment 30 weeks. The wound is located on the Left Calcaneus. The wound measures 2cm length x 3cm width x 0.2cm depth; 4.712cm^2 area and 0.942cm^3 volume. There is Fat Layer (Subcutaneous Tissue) exposed. There is no tunneling or undermining noted. There is a medium amount of serosanguineous drainage noted. There is medium (34-66%) red, hyper - granulation within the wound bed. There is a medium (34-66%) amount of necrotic tissue within the wound bed including Adherent Slough. Bamber, Camila W. 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 There was a Excisional Skin/Subcutaneous Tissue  Debridement with a total area of 6 sq cm performed by Nelida Meuse., PA-C. With the following instrument(s): Curette to remove Viable and Non-Viable tissue/material. Material removed includes Subcutaneous Tissue and Slough and. A time out was conducted at 08:32, prior to the start of the procedure. A Minimum amount of bleeding was controlled with Pressure. The procedure was tolerated well. Post Debridement Measurements: 2cm length x 3cm width x 0.2cm depth; 0.942cm^3 volume. Character of Wound/Ulcer Post Debridement is stable. Severity of Tissue Post Debridement is: Fat layer exposed. Post procedure Diagnosis Wound #1: Same as Pre-Procedure Pre-procedure diagnosis of Wound #1 is a Diabetic Wound/Ulcer of the Lower Extremity located  on the Left Calcaneus. A skin graft procedure using a bioengineered skin substitute/cellular or tissue based product was performed by Nelida Meuse., PA-C with the following instrument(s): N/A. Grafix prime was applied and secured with Steri-Strips. 6 sq cm of product was utilized and 0 sq cm was wasted. Post Application, Drawtex was applied. A Time Out was conducted at 08:40, prior to the start of the procedure. The procedure was tolerated well with a pain level of 0 throughout and a pain level of 0 following the procedure. Post procedure Diagnosis Wound #1: Same as Pre-Procedure . Plan Follow-up Appointments: Return Appointment in 1 week. Nurse Visit as needed Bathing/ Shower/ Hygiene: Wash wounds with antibacterial soap and water. May shower; gently cleanse wound with antibacterial soap, rinse and pat dry prior to dressing wounds - shower with dressing on and remove after shower No tub bath. Anesthetic (Use 'Patient Medications' Section for Anesthetic Order Entry): Lidocaine applied to wound bed Cellular or Tissue Based Products: Cellular or Tissue Based Product Type: - grafix PL Prime #1 applied Edema Control - Lymphedema / Segmental Compressive Device /  Other: Elevate, Exercise Daily and Avoid Standing for Leisner Periods of Time. Elevate leg(s) parallel to the floor when sitting. DO YOUR BEST to sleep in the bed at night. DO NOT sleep in your recliner. Hanks hours of sitting in a recliner leads to swelling of the legs and/or potential wounds on your backside. WOUND #1: - Calcaneus Wound Laterality: Left Cleanser: Normal Saline 3 x Per Week/30 Days Discharge Instructions: Wash your hands with soap and water. Remove old dressing, discard into plastic bag and place into trash. Cleanse the wound with Normal Saline prior to applying a clean dressing using gauze sponges, not tissues or cotton balls. Do not scrub or use excessive force. Pat dry using gauze sponges, not tissue or cotton balls. Primary Dressing: Grafix Prime 3 x Per Week/30 Days Discharge Instructions: #1 Secondary Dressing: Drawtex Hydroconductive Wound Dressing, 2x2 (in/in) (Generic) 3 x Per Week/30 Days Discharge Instructions: Apply to wound with drainage. Secondary Dressing: (BORDER) Zetuvit Plus SILICONE BORDER Dressing 5x5 (in/in) (Generic) 3 x Per Week/30 Days Discharge Instructions: Please do not put silicone bordered dressings under wraps. Use non-bordered dressing only. Vondra, Areeb W. ([id number]) 1. Patient did have the first application of Grafix applied in the office today. Hopefully this is good to help to speed up the wound healing. That is the goal. 2. I would recommend as well that we continue with the bordered foam dressing and the drawtex to bolster and keep the drainage away from the surface of the wound I think this is doing quite well. 3. I would also recommend they change the outer dressing only if need be in order to prevent this from becoming too moist and wet. Patient is in agreement with that plan. We will see patient back for reevaluation in 1 week here in the clinic. If anything worsens or changes patient will contact our office for  additional recommendations. Electronic Signature(s) Signed: 03/06/2022 11:14:27 AM By: Lenda Kelp PA-C Entered By: Lenda Kelp on 03/06/2022 11:14:27 Viernes, Theo W. ([id number]) -------------------------------------------------------------------------------- SuperBill Details Patient Name: Khalsa, Bj W. Date of Service: 03/06/2022 Medical Record Number: [id number] Patient Account Number: [id number] Date of Birth/Sex: 1955-11-20 (66 y.o. M) Treating RN: Yevonne Pax Primary Care Provider: [name] Other Clinician: Betha Loa Referring Provider: [name] Treating Provider/Extender: Allen Derry Weeks in Treatment: 30 Diagnosis Coding ICD-10 Codes Code Description E11.621 Type 2 diabetes mellitus with foot  ulcer L97.422 Non-pressure chronic ulcer of left heel and midfoot with fat layer exposed M86.672 Other chronic osteomyelitis, left ankle and foot I87.2 Venous insufficiency (chronic) (peripheral) I73.89 Other specified peripheral vascular diseases I10 Essential (primary) hypertension Facility Procedures CPT4 Code: 56213086 Description: (Facility Use Only) Grafix Prime 2x3 Sq SQ CM Modifier: Quantity: 6 CPT4 Code: 57846962 Description: 15275 - SKIN SUB GRAFT FACE/NK/HF/G Modifier: Quantity: 1 CPT4 Code: Description: ICD-10 Diagnosis Description L97.422 Non-pressure chronic ulcer of left heel and midfoot with fat layer expos Modifier: ed Quantity: Physician Procedures CPT4 Code: 9528413 Description: 15275 - WC PHYS SKIN SUB GRAFT FACE/NK/HF/G Modifier: Quantity: 1 CPT4 Code: Description: ICD-10 Diagnosis Description L97.422 Non-pressure chronic ulcer of left heel and midfoot with fat layer expos Modifier: ed Quantity: Electronic Signature(s) Signed: 03/06/2022 11:14:51 AM By: Lenda Kelp PA-C Entered By: Lenda Kelp on 03/06/2022 11:14:50

## 2022-03-12 ENCOUNTER — Other Ambulatory Visit: Payer: Self-pay | Admitting: Adult Health

## 2022-03-13 ENCOUNTER — Encounter: Payer: No Typology Code available for payment source | Admitting: Physician Assistant

## 2022-03-13 DIAGNOSIS — E11621 Type 2 diabetes mellitus with foot ulcer: Secondary | ICD-10-CM | POA: Diagnosis not present

## 2022-03-13 NOTE — Progress Notes (Addendum)
[name] [name] ([id number]) Visit Report for 03/13/2022 Chief Complaint Document Details Patient Name: [name] [name] W. Date of Service: 03/13/2022 8:15 AM Medical Record Number: [id number] Patient Account Number: [id number] Date of Birth/Sex: July 25, 1955 (66 y.o. M) Treating RN: Huel Coventry Primary Care Provider: [name] Other Clinician: Betha Loa Referring Provider: [name] Treating Provider/Extender: Rowan Blase in Treatment: 31 Information Obtained from: Patient Chief Complaint Left heel ulcer Electronic Signature(s) Signed: 03/13/2022 8:21:31 AM By: Lenda Kelp PA-C Entered By: Lenda Kelp on 03/13/2022 08:21:31 [name] [name] W. ([id number]) -------------------------------------------------------------------------------- Cellular or Tissue Based Product Details Patient Name: [name] [name] W. Date of Service: 03/13/2022 8:15 AM Medical Record Number: [id number] Patient Account Number: [id number] Date of Birth/Sex: 05-27-1956 (66 y.o. M) Treating RN: Huel Coventry Primary Care Provider: [name] Other Clinician: Betha Loa Referring Provider: [name] Treating Provider/Extender: Rowan Blase in Treatment: 31 Cellular or Tissue Based Product Type Wound #1 Left Calcaneus Applied to: Performed By: Physician Nelida Meuse., PA-C Cellular or Tissue Based Product Grafix prime Type: Level of Consciousness (Pre- Awake and Alert procedure): Pre-procedure Verification/Time Out Yes - 08:49 Taken: Location: genitalia / hands / feet / multiple digits Wound Size (sq cm): 8.1 Product Size (sq cm): 6 Waste Size (sq cm): 0 Amount of Product Applied (sq cm): 6 Instrument Used: Scissors Lot #: [id number] Order #: 2 Expiration Date: 07/16/2023 Fenestrated: No Reconstituted: No Secured: Yes Secured With: Steri-Strips Dressing Applied: Yes Primary Dressing: bolus Procedural Pain: 0 Post Procedural Pain: 0 Response to Treatment: Procedure was tolerated  well Level of Consciousness (Post- Awake and Alert procedure): Post Procedure Diagnosis Same as Pre-procedure Electronic Signature(s) Signed: 03/13/2022 5:09:23 PM By: Betha Loa Entered By: Betha Loa on 03/13/2022 08:52:11 [name] [name] W. ([id number]) -------------------------------------------------------------------------------- Debridement Details Patient Name: [name] [name] W. Date of Service: 03/13/2022 8:15 AM Medical Record Number: [id number] Patient Account Number: [id number] Date of Birth/Sex: 1956-04-28 (66 y.o. M) Treating RN: Huel Coventry Primary Care Provider: [name] Other Clinician: Betha Loa Referring Provider: [name] Treating Provider/Extender: Rowan Blase in Treatment: 31 Debridement Performed for Wound #1 Left Calcaneus Assessment: Performed By: Physician Nelida Meuse., PA-C Debridement Type: Debridement Severity of Tissue Pre Debridement: Fat layer exposed Level of Consciousness (Pre- Awake and Alert procedure): Pre-procedure Verification/Time Out Yes - 08:39 Taken: Start Time: 08:39 Total Area Debrided (L x W): 3 (cm) x 3.5 (cm) = 10.5 (cm) Tissue and other material Viable, Non-Viable, Callus, Slough, Subcutaneous, Slough debrided: Level: Skin/Subcutaneous Tissue Debridement Description: Excisional Instrument: Curette Bleeding: Minimum Hemostasis Achieved: Pressure End Time: 08:45 Response to Treatment: Procedure was tolerated well Level of Consciousness (Post- Awake and Alert procedure): Post Debridement Measurements of Total Wound Length: (cm) 2.7 Width: (cm) 3 Depth: (cm) 0.2 Volume: (cm) 1.272 Character of Wound/Ulcer Post Debridement: Stable Severity of Tissue Post Debridement: Fat layer exposed Post Procedure Diagnosis Same as Pre-procedure Electronic Signature(s) Signed: 03/13/2022 12:16:04 PM By: Lenda Kelp PA-C Signed: 03/13/2022 5:09:23 PM By: Betha Loa Signed: 03/13/2022 5:56:59 PM By: Elliot Gurney,  BSN, RN, CWS, Kim RN, BSN Entered By: Betha Loa on 03/13/2022 08:45:49 [name] [name] W. ([id number]) -------------------------------------------------------------------------------- HPI Details Patient Name: [name], [name] Date of Service: 03/13/2022 8:15 AM Medical Record Number: [id number] Patient Account Number: [id number] Date of Birth/Sex: 05-Sep-1955 (66 y.o. M) Treating RN: Huel Coventry Primary Care Provider: [name] Other Clinician: Betha Loa Referring Provider: [name] Treating Provider/Extender: Rowan Blase in Treatment: 31 History of Present Illness HPI Description: 08/08/2021 upon evaluation today  patient appears to be doing somewhat poorly in regard to a heel ulcer that began as a result of the patient tells me him trying to get his foot into his shoes that were somewhat tight. This was around 4 January. He was using a shoehorn fairly aggressively and thinks that he may have caused the bruising at that time. Fortunately there does not appear to be any signs of infection at this point. Nonetheless he does have some definite necrotic tissue. This can need to be cleared away. Nonetheless unfortunately [name] we do not have clearance in my opinion therefore to go forward with an aggressive sharp debridement. Patient does have diabetes mellitus type 2 with the most recent hemoglobin A1c of 5.2 and that was on June 2022. He also has a history of chronic venous insufficiency, peripheral vascular disease, and hypertension. Obviously with regard to the peripheral vascular disease we will continue to send him to vascular to quantify his ABIs and TBI's. 08/22/2021 upon evaluation today patient appears to be doing well with regard to his wound. This is definitely showing some signs of improvement which is great news and overall very pleased with where things stand today. There does not appear to be any evidence of active infection locally nor  systemically which is great news and overall I feel like that this is showing signs of improvement. It is definitely much less irritated as compared to last week. I am very pleased in that regard I am good to be able to get a lot of this dry skin off today as well which is great news. 09/05/2021 upon evaluation today patient's heel actually showing signs of some improvement though there is still a bit of eschar noted in the center part of the wound. I do believe this is going require some sharp debridement to clear this away. The Iodosorb just is not quite cutting it. Fortunately there does not appear to be any signs of active infection at this time locally nor systemically. 09/12/2021 upon evaluation today patient appears to be doing well with regard to his wound. He has been tolerating the dressing changes I do believe that clearing off the eschar last week has been to help there is still some necrotic tissue removed today overall he seems to be doing quite well however. 09/20/2021 upon evaluation today patient appears to be doing well with regard to his wound he does have some necrotic tissue noted on the surface of the wound but I do feel like we are getting better here definitely making good progress. I do not see any signs of infection which is great news and overall I think that we are on the right track. 09-26-2021 upon evaluation today patient appears to be doing well with regard to his heel ulcer. This is actually measuring smaller and looking better today. Fortunately I do not see any evidence of active infection locally or systemically which is great news. 10-03-2021 upon evaluation today patient appears to be doing well currently in regard to his heel for the most part although he does have a little bit of what appears to be bruising in the base of the wound. I am not sure exactly where to get the pressure from but this very likely is happening when he is walking he tells me has been wearing  some other shoes been a little bit more on his feet than normal. Fortunately I do not see any evidence of active infection at this time. I have contemplated  put him in a different type of shoe or even a total contact cast to be honest. With that being said his balance is not good and I therefore have some concerns in that regard. 10-17-2021 upon evaluation today patient appears to be doing poorly in regard to his heel. Again this is showing signs of significant deep tissue injury which is bringing into question whether or not he may actually have something going on here which is more related to his blood flow. We have previously done arterial study though to be honest with ABIs right at 1 and noncompressible which tends to make me think this may be falsely elevated coupled with TBI's which were at 0.5 with biphasic waveforms I am not certain he is getting the blood flow needed to completely heal this wound. I discussed with him today that he may need to see vascular in order to discuss options for her further angiogram/evaluation in general. I would at least leave it up to them as to whether they feel the angiogram is necessary or not. Nonetheless I do believe pressure is also playing a component to this he does tell me he started sleeping with his heel on the bed not raised underneath the pillow already this was dark and discolored that may have been what triggered this to get worse I am not really sure. 5/8; the wound seems to have less of a deep tissue injury but there is still eschar. Scant amount of purulent drainage some erythema medially extending up the heel. He has neuropathy no pain no systemic illness. Culture that we did last time showed moderate Proteus, moderate Enterococcus and moderate Pseudomonas. The Enterococcus will require ampicillin so he will require 2 antibiotics. He is using Santyl to the wound 10-31-2021 upon evaluation today patient appears to be doing well with regard to his  wound all things considered. There is still necrotic tissue noted in the central portion of the wound which is not what we want to see but unfortunately I think this is just could be the case until we ensure he has good blood flow and can subsequently get things moving in a better direction. Fortunately I do not see any evidence of infection locally or systemically which is great news and overall I think that we are on the right track in that regard. I am going to go ahead and refill the Augmentin as well as the Cipro for him today he was given originally 10 days worth I would have her extend that for 7 days. 11-07-2021 upon evaluation today patient appears to be doing better in regard to the overall appearance of his wound and I do feel like the Melburn Popper is doing a good job here. Fortunately there does not appear to be any evidence of active infection locally nor systemically which is great news and overall I am extremely pleased with where things stand today. The patient is happy as well with the progress. Nonetheless I had extended his Augmentin and Cipro for him at the last visit I think that is going to need to be extended a bit further. That means he is probably can need some probiotics as well. 11-21-2021 upon evaluation today patient appears to be doing a little better in regard to size and is definitely making some progress here. With that being said he also definitely has some areas that are still fairly thick with necrotic tissue. We discussed debridement yet again today and he is Oxendine, Lindy W. ([id number]) amenable to doing  some debridement as Stipes as its not too aggressive which I think we can definitely do. I do believe the Melburn Popper is doing a great job and will definitely be continuing that as well. 12-05-2021 upon evaluation today patient appears to be doing well with regard to his heel. He has been tolerating the dressing changes without complication. Fortunately there does not appear to be  any evidence of active infection locally or systemically at this time. No fevers, chills, nausea, vomiting, or diarrhea. 12-19-2021 upon evaluation today patient appears to be doing excellent in regard to his wound. Has been tolerating the Santyl in an excellent fashion. Fortunately I do not see any evidence of infection locally or systemically which is great news and overall I am very pleased with where we stand today. 01-02-2022 upon evaluation today patient appears to be doing excellent in regard to his wound. He has been tolerating the dressing changes without complication. Fortunately I do not see any evidence of active infection there is minimal amount of slough and biofilm buildup which I Minna perform debridement in regard to today but other than this overall I really feel like that the patient is doing excellent and I think he is ready to switch to a collagen based dressing. I think this is going to do well for him. 01-19-2022 upon evaluation today patient appears to be doing much better in regard to his wound. He has been tolerating the dressing changes without complication. Fortunately there does not appear to be any evidence of active infection locally or systemically at this time which is great news and overall I am extremely pleased with where we stand. There does not appear to be any evidence of active infection whatsoever which is great news. No fevers, chills, nausea, vomiting, or diarrhea. 01-30-2022 upon evaluation today patient appears to be doing well currently in regard to his wound. This is actually showing signs of excellent improvement I am very pleased based on what we are seeing today. I do not see any evidence of active infection locally or systemically at this time. 02-13-2022 upon evaluation today patient appears to be doing excellent in regard to his wound. This is showing signs of a little bit of maceration otherwise it seems to be a little bit smaller which is great news.  Overall very pleased with where things stand at this point. 02-27-2022 upon evaluation today patient appears to be doing well currently in regard to his wound. He has been tolerating the dressing changes without complication. Fortunately I see no evidence of active infection locally or systemically at this time which is great news. No fevers, chills, nausea, vomiting, or diarrhea. 03-06-2022 upon evaluation today patient appears to be doing excellent in regard to his wound. In fact this is showing signs of improvement even compared to the last time I saw him. I am very pleased in that regard. Fortunately I do not see any evidence of active infection locally or systemically at this time which is great news. No fevers, chills, nausea, vomiting, or diarrhea. We do have the first graphics available for application today. 03-13-2022 upon evaluation today patient appears to be doing well other than the fact that his wound does appear to be very macerated. I think that we can have to use roll gauze to secure in place and with an ABD pad instead of doing the bordered foam dressing although I know this is easier is also I think can be more problematic from a moisture standpoint. Fortunately I do  not see any signs of active infection locally nor systemically and I do believe the wound bed itself looks to be doing much better. Electronic Signature(s) Signed: 03/13/2022 8:56:45 AM By: Lenda Kelp PA-C Entered By: Lenda Kelp on 03/13/2022 08:56:45 [name] [name] W. ([id number]) -------------------------------------------------------------------------------- Physical Exam Details Patient Name: [name] [name] W. Date of Service: 03/13/2022 8:15 AM Medical Record Number: [id number] Patient Account Number: [id number] Date of Birth/Sex: 06-08-1956 (66 y.o. M) Treating RN: Huel Coventry Primary Care Provider: [name] Other Clinician: Betha Loa Referring Provider: [name] Treating Provider/Extender:  Allen Derry Weeks in Treatment: 31 Constitutional Well-nourished and well-hydrated in no acute distress. Respiratory normal breathing without difficulty. Psychiatric this patient is able to make decisions and demonstrates good insight into disease process. Alert and Oriented x 3. pleasant and cooperative. Notes Upon inspection patient's wound bed actually showed signs of good granulation and epithelization at this point. Fortunately I do not see any evidence of active infection locally or systemically which is great news and overall I am extremely pleased with where we stand today. I did perform debridement clearway some of the callus around the edges of the wound the good news is this is completely flat all the way around the edges which means it has a good chance of being able to actually heal and at this point. I do not see any signs of infection locally or systemically. After debridement today I did go ahead and reapply the second application of Grafix and the patient tolerated this without complication. Electronic Signature(s) Signed: 03/13/2022 8:57:26 AM By: Lenda Kelp PA-C Entered By: Lenda Kelp on 03/13/2022 08:57:26 Heiss, Mia W. ([id number]) -------------------------------------------------------------------------------- Physician Orders Details Patient Name: [name] [name] W. Date of Service: 03/13/2022 8:15 AM Medical Record Number: [id number] Patient Account Number: [id number] Date of Birth/Sex: 1955-07-30 (66 y.o. 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 o Nurse Visit as needed [facility] o Wash wounds with antibacterial soap and water. o May shower; gently cleanse wound with antibacterial soap, rinse and pat dry prior to dressing wounds - shower with dressing on and remove after shower o No tub bath. Anesthetic (Use 'Patient Medications' Section for Anesthetic Order Entry) o Lidocaine applied to wound bed Cellular or Tissue Based Products o Cellular or Tissue Based Product Type: - grafix PL Prime #2 applied Edema Control - Lymphedema / Segmental Compressive Device / Other o Elevate, Exercise Daily and Avoid Standing for [place] Periods of Time. o Elevate leg(s) parallel to the floor when sitting. o DO YOUR BEST to sleep in the bed at night. DO NOT sleep in your recliner. Menzer hours of sitting in a recliner leads to swelling of the legs and/or potential wounds on your backside. Wound Treatment Wound #1 - Calcaneus Wound Laterality: Left Cleanser: Normal Saline 3 x Per Week/30 Days Discharge Instructions: Wash your hands with soap and water. Remove old dressing, discard into plastic bag and place into trash. Cleanse the wound with Normal Saline prior to applying a clean dressing using  gauze sponges, not tissues or cotton balls. Do not scrub or use excessive force. Pat dry using gauze sponges, not tissue or cotton balls. Primary Dressing: Grafix Prime 3 x Per Week/30 Days Discharge Instructions: #2 Secondary Dressing: ABD Pad 5x9 (in/in) (DME) (Generic) 3 x Per Week/30 Days Discharge Instructions: Cover with ABD pad Secondary Dressing: Kerlix 4.5 x 4.1 (in/yd) (DME) (Generic) 3 x Per Week/30 Days Discharge Instructions: Apply Kerlix 4.5 x 4.1 (in/yd) as instructed Secured With: Kerlix Roll Sterile or  Non-Sterile 6-ply 4.5x4 (yd/yd) 3 x Per Week/30 Days Discharge Instructions: Apply Kerlix as directed Electronic Signature(s) Jump, Barnard W. ([id number]) Signed: 03/13/2022 12:16:04 PM By: Lenda Kelp PA-C Signed: 03/13/2022 5:09:23 PM By: Betha Loa Entered By: Betha Loa on 03/13/2022 09:03:41 Cubbage, Jerzy W. ([id number]) -------------------------------------------------------------------------------- Problem List Details Patient Name: Dowler, Delaine W. Date of Service: 03/13/2022 8:15 AM Medical Record Number: [id number] Patient Account Number: [id number] Date of Birth/Sex: 07/30/1955 (66 y.o. M) Treating RN: Huel Coventry Primary Care Provider: [name] Other Clinician: Betha Loa Referring Provider: [name] Treating Provider/Extender: Allen Derry Weeks in Treatment: 31 Active Problems ICD-10 Encounter Code Description Active Date MDM Diagnosis E11.621 Type 2 diabetes mellitus with foot ulcer 08/08/2021 No Yes L97.422 Non-pressure chronic ulcer of left heel and midfoot with fat layer 08/08/2021 No Yes exposed Y86.578 Other chronic osteomyelitis, left ankle and foot 11/07/2021 No Yes I87.2 Venous insufficiency (chronic) (peripheral) 08/08/2021 No Yes I73.89 Other specified peripheral vascular diseases 08/08/2021 No Yes I10 Essential (primary) hypertension 08/08/2021 No Yes Inactive Problems Resolved Problems Electronic Signature(s) Signed: 03/13/2022 8:21:28 AM By: Lenda Kelp PA-C Entered By: Lenda Kelp on 03/13/2022 08:21:28 Mcgovern, Talik W. ([id number]) -------------------------------------------------------------------------------- Progress Note Details Patient Name: [name] [name] W. Date of Service: 03/13/2022 8:15 AM Medical Record Number: [id number] Patient Account Number: [id number] Date of Birth/Sex: [birth year] (66 y.o. M) Treating RN: Huel Coventry Primary Care Provider: [name] Other Clinician: Betha Loa Referring Provider: [name] Treating  Provider/Extender: Rowan Blase in Treatment: 31 Subjective Chief Complaint Information obtained from Patient Left heel ulcer History of Present Illness (HPI) 08/08/2021 upon evaluation today patient appears to be doing somewhat poorly in regard to a heel ulcer that began as a result of the patient tells me him trying to get his foot into his shoes that were somewhat tight. This was around 4 January. He was using a shoehorn fairly aggressively and thinks that he may have caused the bruising at that time. Fortunately there does not appear to be any signs of infection at this point. Nonetheless he does have some definite necrotic tissue. This can need to be cleared away. Nonetheless unfortunately [name] we do not have clearance in my opinion therefore to go forward with an aggressive sharp debridement. Patient does have diabetes mellitus type 2 with the most recent hemoglobin A1c of 5.2 and that was on June 2022. He also has a history of chronic venous insufficiency, peripheral vascular disease, and hypertension. Obviously with regard to the peripheral vascular disease we will continue to send him to vascular to quantify his ABIs and TBI's. 08/22/2021 upon evaluation today patient appears to be doing well with regard to his wound. This is definitely showing some signs of improvement which is great news and overall very pleased with where things stand today. There does not appear to be any evidence of active infection locally nor systemically which is great news and overall I feel like that this is showing signs of improvement. It is  definitely much less irritated as compared to last week. I am very pleased in that regard I am good to be able to get a lot of this dry skin off today as well which is great news. 09/05/2021 upon evaluation today patient's heel actually showing signs of some improvement though there is still a bit of eschar noted in the center part of the wound.  I do believe this is going require some sharp debridement to clear this away. The Iodosorb just is not quite cutting it. Fortunately there does not appear to be any signs of active infection at this time locally nor systemically. 09/12/2021 upon evaluation today patient appears to be doing well with regard to his wound. He has been tolerating the dressing changes I do believe that clearing off the eschar last week has been to help there is still some necrotic tissue removed today overall he seems to be doing quite well however. 09/20/2021 upon evaluation today patient appears to be doing well with regard to his wound he does have some necrotic tissue noted on the surface of the wound but I do feel like we are getting better here definitely making good progress. I do not see any signs of infection which is great news and overall I think that we are on the right track. 09-26-2021 upon evaluation today patient appears to be doing well with regard to his heel ulcer. This is actually measuring smaller and looking better today. Fortunately I do not see any evidence of active infection locally or systemically which is great news. 10-03-2021 upon evaluation today patient appears to be doing well currently in regard to his heel for the most part although he does have a little bit of what appears to be bruising in the base of the wound. I am not sure exactly where to get the pressure from but this very likely is happening when he is walking he tells me has been wearing some other shoes been a little bit more on his feet than normal. Fortunately I do not see any evidence of active infection at this time. I have contemplated put him in a different type of shoe or even a total contact cast to be honest. With that being said his balance is not good and I therefore have some concerns in that regard. 10-17-2021 upon evaluation today patient appears to be doing poorly in regard to his heel. Again this is showing signs of  significant deep tissue injury which is bringing into question whether or not he may actually have something going on here which is more related to his blood flow. We have previously done arterial study though to be honest with ABIs right at 1 and noncompressible which tends to make me think this may be falsely elevated coupled with TBI's which were at 0.5 with biphasic waveforms I am not certain he is getting the blood flow needed to completely heal this wound. I discussed with him today that he may need to see vascular in order to discuss options for her further angiogram/evaluation in general. I would at least leave it up to them as to whether they feel the angiogram is necessary or not. Nonetheless I do believe pressure is also playing a component to this he does tell me he started sleeping with his heel on the bed not raised underneath the pillow already this was dark and discolored that may have been what triggered this to get worse I am not really sure. 5/8; the wound seems to  have less of a deep tissue injury but there is still eschar. Scant amount of purulent drainage some erythema medially extending up the heel. He has neuropathy no pain no systemic illness. Culture that we did last time showed moderate Proteus, moderate Enterococcus and moderate Pseudomonas. The Enterococcus will require ampicillin so he will require 2 antibiotics. He is using Santyl to the wound 10-31-2021 upon evaluation today patient appears to be doing well with regard to his wound all things considered. There is still necrotic tissue noted in the central portion of the wound which is not what we want to see but unfortunately I think this is just could be the case until we ensure he has good blood flow and can subsequently get things moving in a better direction. Fortunately I do not see any evidence of infection locally or systemically which is great news and overall I think that we are on the right track in that  regard. I am going to go ahead and refill the Augmentin as well as the Cipro for him today he was given originally 10 days worth I would have her extend that for 7 days. 11-07-2021 upon evaluation today patient appears to be doing better in regard to the overall appearance of his wound and I do feel like the Melburn Popper is doing a good job here. Fortunately there does not appear to be any evidence of active infection locally nor systemically which is great news and overall I am extremely pleased with where things stand today. The patient is happy as well with the progress. Nonetheless I had extended his [name] [name] W. ([id number]) Augmentin and Cipro for him at the last visit I think that is going to need to be extended a bit further. That means he is probably can need some probiotics as well. 11-21-2021 upon evaluation today patient appears to be doing a little better in regard to size and is definitely making some progress here. With that being said he also definitely has some areas that are still fairly thick with necrotic tissue. We discussed debridement yet again today and he is amenable to doing some debridement as Rota as its not too aggressive which I think we can definitely do. I do believe the Melburn Popper is doing a great job and will definitely be continuing that as well. 12-05-2021 upon evaluation today patient appears to be doing well with regard to his heel. He has been tolerating the dressing changes without complication. Fortunately there does not appear to be any evidence of active infection locally or systemically at this time. No fevers, chills, nausea, vomiting, or diarrhea. 12-19-2021 upon evaluation today patient appears to be doing excellent in regard to his wound. Has been tolerating the Santyl in an excellent fashion. Fortunately I do not see any evidence of infection locally or systemically which is great news and overall I am very pleased with where we stand today. 01-02-2022 upon  evaluation today patient appears to be doing excellent in regard to his wound. He has been tolerating the dressing changes without complication. Fortunately I do not see any evidence of active infection there is minimal amount of slough and biofilm buildup which I Minna perform debridement in regard to today but other than this overall I really feel like that the patient is doing excellent and I think he is ready to switch to a collagen based dressing. I think this is going to do well for him. 01-19-2022 upon evaluation today patient appears to be doing much better in  regard to his wound. He has been tolerating the dressing changes without complication. Fortunately there does not appear to be any evidence of active infection locally or systemically at this time which is great news and overall I am extremely pleased with where we stand. There does not appear to be any evidence of active infection whatsoever which is great news. No fevers, chills, nausea, vomiting, or diarrhea. 01-30-2022 upon evaluation today patient appears to be doing well currently in regard to his wound. This is actually showing signs of excellent improvement I am very pleased based on what we are seeing today. I do not see any evidence of active infection locally or systemically at this time. 02-13-2022 upon evaluation today patient appears to be doing excellent in regard to his wound. This is showing signs of a little bit of maceration otherwise it seems to be a little bit smaller which is great news. Overall very pleased with where things stand at this point. 02-27-2022 upon evaluation today patient appears to be doing well currently in regard to his wound. He has been tolerating the dressing changes without complication. Fortunately I see no evidence of active infection locally or systemically at this time which is great news. No fevers, chills, nausea, vomiting, or diarrhea. 03-06-2022 upon evaluation today patient appears to be  doing excellent in regard to his wound. In fact this is showing signs of improvement even compared to the last time I saw him. I am very pleased in that regard. Fortunately I do not see any evidence of active infection locally or systemically at this time which is great news. No fevers, chills, nausea, vomiting, or diarrhea. We do have the first graphics available for application today. 03-13-2022 upon evaluation today patient appears to be doing well other than the fact that his wound does appear to be very macerated. I think that we can have to use roll gauze to secure in place and with an ABD pad instead of doing the bordered foam dressing although I know this is easier is also I think can be more problematic from a moisture standpoint. Fortunately I do not see any signs of active infection locally nor systemically and I do believe the wound bed itself looks to be doing much better. Objective Constitutional Well-nourished and well-hydrated in no acute distress. Vitals Time Taken: 8:15 AM, Height: 68 in, Weight: 220 lbs, BMI: 33.4, Temperature: 98.2 F, Pulse: 80 bpm, Respiratory Rate: 16 breaths/min, Blood Pressure: 169/88 mmHg. Respiratory normal breathing without difficulty. Psychiatric this patient is able to make decisions and demonstrates good insight into disease process. Alert and Oriented x 3. pleasant and cooperative. General Notes: Upon inspection patient's wound bed actually showed signs of good granulation and epithelization at this point. Fortunately I do not see any evidence of active infection locally or systemically which is great news and overall I am extremely pleased with where we stand today. I did perform debridement clearway some of the callus around the edges of the wound the good news is this is completely flat all the way around the edges which means it has a good chance of being able to actually heal and at this point. I do not see any signs of infection locally  or systemically. After debridement today I did go ahead and reapply the second application of Grafix and the patient tolerated this without complication. Montilla, Zigmond W. ([id number]) Integumentary (Hair, Skin) Wound #1 status is Open. Original cause of wound was Pressure Injury. The date acquired was: 06/19/2021.  The wound has been in treatment 31 weeks. The wound is located on the Left Calcaneus. The wound measures 2.7cm length x 3cm width x 0.1cm depth; 6.362cm^2 area and 0.636cm^3 volume. There is Fat Layer (Subcutaneous Tissue) exposed. There is a medium amount of serosanguineous drainage noted. There is medium (34-66%) red, hyper - granulation within the wound bed. There is a medium (34-66%) amount of necrotic tissue within the wound bed including Adherent Slough. 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 There was a Excisional Skin/Subcutaneous Tissue Debridement with a total area of 10.5 sq cm performed by Nelida Meuse., PA-C. With the following instrument(s): Curette to remove Viable and Non-Viable tissue/material. Material removed includes Callus, Subcutaneous Tissue, and Slough. A time out was conducted at 08:39, prior to the start of the procedure. A Minimum amount of bleeding was controlled with Pressure. The procedure was tolerated well. Post Debridement Measurements: 2.7cm length x 3cm width x 0.2cm depth; 1.272cm^3 volume. Character of Wound/Ulcer Post Debridement is stable. Severity of Tissue Post Debridement is:  Fat layer exposed. Post procedure Diagnosis Wound #1: Same as Pre-Procedure Pre-procedure diagnosis of Wound #1 is a Diabetic Wound/Ulcer of the Lower Extremity located on the Left Calcaneus. A skin graft procedure using a bioengineered skin substitute/cellular or tissue based product was performed by Nelida Meuse., PA-C with the following instrument(s): Scissors. Grafix prime was applied and secured with Steri-Strips. 6 sq cm of product was utilized and 0 sq cm was wasted. Post Application, bolus was applied. A Time Out was conducted at 08:49, prior to the start of the procedure. The procedure was tolerated well with a pain level of 0 throughout and a pain level of 0 following the procedure. Post procedure Diagnosis Wound #1: Same as Pre-Procedure . Plan Follow-up Appointments: Return Appointment in 1 week. Nurse Visit as needed Bathing/ Shower/ Hygiene: Wash wounds with antibacterial soap and water. May shower; gently cleanse wound with antibacterial soap, rinse and pat dry prior to dressing wounds - shower with dressing on and remove after shower No tub bath. Anesthetic (Use 'Patient Medications' Section for Anesthetic Order Entry): Lidocaine applied to wound bed Cellular or Tissue Based Products: Cellular or Tissue Based Product Type: - grafix PL Prime #2 applied Edema Control - Lymphedema / Segmental Compressive Device / Other: Elevate, Exercise Daily and Avoid Standing for Brandon Periods of Time. Elevate leg(s) parallel to the floor when sitting. DO YOUR BEST to sleep in the bed at night. DO NOT sleep in your recliner. Auriemma hours of sitting in a recliner leads to swelling of the legs and/or potential wounds on your backside. WOUND #1: - Calcaneus Wound Laterality: Left Cleanser: Normal Saline 3 x Per Week/30 Days Discharge Instructions: Wash your hands with soap and water. Remove old dressing, discard into plastic bag and place into trash. Cleanse the wound with Normal Saline  prior to applying a clean dressing using gauze sponges, not tissues or cotton balls. Do not scrub or use Ringel, Mariah W. ([id number]) excessive force. Pat dry using gauze sponges, not tissue or cotton balls. Primary Dressing: Grafix Prime 3 x Per Week/30 Days Discharge Instructions: #  2 Secondary Dressing: ABD Pad 5x9 (in/in) (DME) (Generic) 3 x Per Week/30 Days Discharge Instructions: Cover with ABD pad Secondary Dressing: Drawtex Hydroconductive Wound Dressing, 2x2 (in/in) (Generic) 3 x Per Week/30 Days Discharge Instructions: Apply to wound with drainage. Secondary Dressing: Kerlix 4.5 x 4.1 (in/yd) (DME) (Generic) 3 x Per Week/30 Days Discharge Instructions: Apply Kerlix 4.5 x 4.1 (in/yd) as instructed Secured With: Kerlix Roll Sterile or Non-Sterile 6-ply 4.5x4 (yd/yd) 3 x Per Week/30 Days Discharge Instructions: Apply Kerlix as directed 1. I am good recommend currently that we go ahead and continue with the recommendation for wound care measures as before and the patient is in agreement with the plan. This includes the use of the ABD pad after applying a piece of gauze folded into a fourth to bolster behind. We are then using the ABD pad over top of this and roll gauze to secure in place. 2. I am good recommend as well that we have the patient continue to monitor for any signs of worsening or infection if anything changes he should let me know he is can be changing this on Wednesday, Friday, and Sunday. Then see where things stand on Monday. We will see patient back for reevaluation in 1 week here in the clinic. If anything worsens or changes patient will contact our office for additional recommendations. Electronic Signature(s) Signed: 03/13/2022 8:58:11 AM By: Lenda Kelp PA-C Entered By: Lenda Kelp on 03/13/2022 08:58:10 Rodenberg, [name] W. ([id number]) -------------------------------------------------------------------------------- SuperBill Details Patient Name: Kolasa, Kyrell  W. Date of Service: 03/13/2022 Medical Record Number: [id number] Patient Account Number: [id number] Date of Birth/Sex: [birth year] (66 y.o. M) Treating RN: Huel Coventry Primary Care Provider: [name] Other Clinician: Betha Loa Referring Provider: [name] Treating Provider/Extender: Allen Derry Weeks in Treatment: 31 Diagnosis Coding ICD-10 Codes Code Description E11.621 Type 2 diabetes mellitus with foot ulcer L97.422 Non-pressure chronic ulcer of left heel and midfoot with fat layer exposed M86.672 Other chronic osteomyelitis, left ankle and foot I87.2 Venous insufficiency (chronic) (peripheral) I73.89 Other specified peripheral vascular diseases I10 Essential (primary) hypertension Facility Procedures CPT4 Code: 78295621 Description: (Facility Use Only) Grafix Prime 2x3 Sq SQ CM Modifier: Quantity: 6 CPT4 Code: 30865784 Description: 15275 - SKIN SUB GRAFT FACE/NK/HF/G Modifier: Quantity: 1 CPT4 Code: Description: ICD-10 Diagnosis Description L97.422 Non-pressure chronic ulcer of left heel and midfoot with fat layer expos Modifier: ed Quantity: Physician Procedures CPT4 Code: 6962952 Description: 15275 - WC PHYS SKIN SUB GRAFT FACE/NK/HF/G Modifier: Quantity: 1 CPT4 Code: Description: ICD-10 Diagnosis Description L97.422 Non-pressure chronic ulcer of left heel and midfoot with fat layer expos Modifier: ed Quantity: Electronic Signature(s) Signed: 03/13/2022 8:58:19 AM By: Lenda Kelp PA-C Entered By: Lenda Kelp on 03/13/2022 08:58:19

## 2022-03-13 NOTE — Progress Notes (Signed)
Crilly, [name] ([id number]) Visit Report for 03/13/2022 Arrival Information Details Patient Name: [name], [name]. Date of Service: 03/13/2022 8:15 AM Medical Record Number: [id number] Patient Account Number: [id number] Date of Birth/Sex: 1956-03-18 (66 y.o. M) Treating RN: [name] Primary Care [name]: [name] Other Clinician: Massie [name] Referring [name]: [name] Treating [name]/Extender: [name] in [name]: 44 Visit Information History Since Last Visit All ordered tests and consults were completed: No Patient Arrived: [name] Added or deleted any medications: No Arrival Time: 08:14 Any new allergies or adverse reactions: No Transfer Assistance: None Had a fall or experienced change in No Patient Requires Transmission-Based No activities of daily living that may affect Precautions: risk of falls: Patient Has Alerts: Yes Hospitalized since last visit: No Patient Alerts: ABI R >1.0 [place] TBI Pain Present Now: No 0.50 ABI L 0.98 Electronic Signature(s) Signed: 03/13/2022 5:09:23 PM By: [name] Entered By: [name] on 03/13/2022 08:15:13 [name], [name]. ([id number]) -------------------------------------------------------------------------------- Clinic Level of Care Assessment Details Patient Name: [name]. Date of Service: 03/13/2022 8:15 AM Medical Record Number: [id number] Patient Account Number: [id number] Date of Birth/Sex: 1956/04/01 (66 y.o. M) Treating RN: [name] Primary Care Ahmaya Ostermiller: [name] Other Clinician: Massie [name] Referring [name]: [name] Treating [name]/Extender: [name] in [name]: 31 Clinic Level of Care Assessment Items TOOL 1 Quantity Score []  - Use when EandM and Procedure is performed on INITIAL visit 0 ASSESSMENTS - Nursing Assessment / Reassessment []  - General Physical Exam (combine [name]/ comprehensive assessment (listed just below) when performed on new 0 pt. evals) []  -  0 Comprehensive Assessment (HX, ROS, Risk Assessments, Wounds Hx, etc.) ASSESSMENTS - Wound and Skin Assessment / Reassessment []  - Dermatologic / Skin Assessment (not related to wound area) 0 ASSESSMENTS - Ostomy and/or Continence Assessment and Care []  - Incontinence Assessment and Management 0 []  - 0 Ostomy Care Assessment and Management (repouching, etc.) PROCESS - Coordination of Care []  - Simple Patient / Family Education for ongoing care 0 []  - 0 Complex (extensive) Patient / Family Education for ongoing care []  - 0 Staff obtains Programmer, systems, Records, Test Results / Process Orders []  - 0 Staff telephones HHA, Nursing Homes / Clarify orders / etc []  - 0 Routine Transfer to another Facility (non-emergent condition) []  - 0 Routine Hospital Admission (non-emergent condition) []  - 0 New Admissions / Biomedical engineer / Ordering NPWT, Apligraf, etc. []  - 0 Emergency Hospital Admission (emergent condition) PROCESS - Special Needs []  - Pediatric / Minor Patient Management 0 []  - 0 Isolation Patient Management []  - 0 Hearing / Language / Visual special needs []  - 0 Assessment of Community assistance (transportation, D/C planning, etc.) []  - 0 Additional assistance / Altered mentation []  - 0 Support Surface(s) Assessment (bed, cushion, seat, etc.) INTERVENTIONS - Miscellaneous []  - External ear exam 0 []  - 0 Patient Transfer (multiple staff / Civil Service fast streamer / Similar devices) []  - 0 Simple Staple / Suture removal (25 or less) []  - 0 Complex Staple / Suture removal (26 or more) []  - 0 Hypo/Hyperglycemic Management (do not check if billed separately) []  - 0 Ankle / Brachial Index (ABI) - do not check if billed separately Has the patient been seen at the hospital within the last three years: Yes Total Score: 0 Level Of Care: ____ [name] ([id number]) Electronic Signature(s) Signed: 03/13/2022 5:09:23 PM By: [name] Entered By: [name] on 03/13/2022  08:55:14 Tall, [name]. ([id number]) -------------------------------------------------------------------------------- Encounter Discharge Information Details Patient  Name: [name]. Date of Service: 03/13/2022 8:15 AM Medical Record Number: [id number] Patient Account Number: [id number] Date of Birth/Sex: 1956/02/27 (66 y.o. M) Treating RN: [name] Primary Care [name]: [name] Other Clinician: Massie [name] Referring [name]: [name] Treating [name]/Extender: [name] in [name]: 31 Encounter Discharge Information Items Post Procedure Vitals Discharge Condition: Stable Temperature (F): 98.2 Ambulatory Status: Cane Pulse (bpm): 80 Discharge Destination: Home Respiratory Rate (breaths/min): 16 Transportation: Private Auto Blood Pressure (mmHg): 169/88 Accompanied By: self Schedule Follow-up Appointment: Yes Clinical Summary of Care: Electronic Signature(s) Signed: 03/13/2022 5:09:23 PM By: [name] Entered By: [name] on 03/13/2022 09:09:40 [name], [name]. ([id number]) -------------------------------------------------------------------------------- Lower Extremity Assessment Details Patient Name: [name], [name]. Date of Service: 03/13/2022 8:15 AM Medical Record Number: [id number] Patient Account Number: [id number] Date of Birth/Sex: 1955-06-26 (66 y.o. M) Treating RN: [name] Primary Care [name]: [name] Other Clinician: Massie [name] Referring [name]: [name] Treating [name]/Extender: [name]: 31 Edema Assessment Assessed: [name]: Yes] [name]: No] Edema: [Left: Ye] [Right: s] Calf Left: Right: Point of Measurement: 30 cm From Medial Instep 35.4 cm Ankle Left: Right: Point of Measurement: 10 cm From Medial Instep 24.2 cm Vascular Assessment Pulses: Dorsalis Pedis Palpable: [Left:Yes] Electronic Signature(s) Signed: 03/13/2022 5:09:23 PM By: [name] Signed: 03/13/2022 5:56:59 PM  By: Gretta Cool, BSN, RN, CWS, Kim RN, BSN Entered By: [name] on 03/13/2022 08:25:24 Smithey, Abenezer [name]. ([id number]) -------------------------------------------------------------------------------- Multi Wound Chart Details Patient Name: [name], [name]. Date of Service: 03/13/2022 8:15 AM Medical Record Number: [id number] Patient Account Number: [id number] Date of Birth/Sex: 27-Apr-1956 (66 y.o. M) Treating RN: [name] Primary Care Mataeo Ingwersen: [name] Other Clinician: Massie [name] Referring Chinita Schimpf: [name] Treating Xylina Rhoads/Extender: [name] in [name]: 31 Vital Signs Height(in): 68 Pulse(bpm): 80 Weight(lbs): 220 Blood Pressure(mmHg): 169/88 Body Mass Index(BMI): 33.4 Temperature(F): 98.2 Respiratory Rate(breaths/min): 16 Photos: [N/A:N/A] Wound Location: Left Calcaneus N/A N/A Wounding Event: Pressure Injury N/A N/A Primary Etiology: Diabetic Wound/Ulcer of the Lower N/A N/A Extremity Comorbid History: Cataracts, Hypertension, Type II N/A N/A Diabetes, Neuropathy Date Acquired: 06/19/2021 N/A N/A Weeks of [name]: 31 N/A N/A Wound Status: Open N/A N/A Wound Recurrence: No N/A N/A Measurements L x [name] x D (cm) 2.7x3x0.1 N/A N/A Area (cm) : 6.362 N/A N/A Volume (cm) : 0.636 N/A N/A % Reduction in Area: -180.30% N/A N/A % Reduction in Volume: -180.20% N/A N/A Classification: Grade 2 N/A N/A Exudate Amount: Medium N/A N/A Exudate Type: Serosanguineous N/A N/A Exudate Color: red, brown N/A N/A Granulation Amount: Medium (34-66%) N/A N/A Granulation Quality: Red, Hyper-granulation N/A N/A Necrotic Amount: Medium (34-66%) N/A N/A Exposed Structures: Fat Layer (Subcutaneous Tissue): N/A N/A Yes Epithelialization: Small (1-33%) N/A N/A [name] Notes Electronic Signature(s) Signed: 03/13/2022 5:09:23 PM By: [name] Entered By: [name] on 03/13/2022 08:25:43 [name], [name].  ([id number]) -------------------------------------------------------------------------------- [place] Details Patient Name: [name], [name]. Date of Service: 03/13/2022 8:15 AM Medical Record Number: [id number] Patient Account Number: [id number] Date of Birth/Sex: 09-14-1955 (66 y.o. M) Treating RN: [name] Primary Care Aza Dantes: [name] Other Clinician: Massie [name] Referring Cherylee Rawlinson: [name] Treating Jann Ra/Extender: [name] in [name]: 31 Active Inactive Wound/Skin Impairment Nursing Diagnoses: Impaired tissue integrity Knowledge deficit related to ulceration/compromised skin integrity Goals: Ulcer/skin breakdown will have a volume reduction of 30% by week 4 Date Initiated: 08/08/2021 Date Inactivated: 12/19/2021 Target Resolution Date: 09/05/2021 Goal Status: Unmet Unmet Reason: comorbities Ulcer/skin breakdown will have a volume reduction of  50% by week 8 Date Initiated: 08/08/2021 Date Inactivated: 12/19/2021 Target Resolution Date: 10/03/2021 Goal Status: Unmet Unmet Reason: comorbities Ulcer/skin breakdown will have a volume reduction of 80% by week 12 Date Initiated: 08/08/2021 Date Inactivated: 12/19/2021 Target Resolution Date: 10/31/2021 Goal Status: Unmet Unmet Reason: comorbities Ulcer/skin breakdown will heal within 14 weeks Date Initiated: 08/08/2021 Target Resolution Date: 11/14/2021 Goal Status: Active Interventions: Assess patient/caregiver ability to obtain necessary supplies Assess patient/caregiver ability to perform ulcer/skin care regimen upon admission and as needed Assess ulceration(s) every visit Provide education on ulcer and skin care Notes: Electronic Signature(s) Signed: 03/13/2022 5:09:23 PM By: [name] Signed: 03/13/2022 5:56:59 PM By: Gretta Cool, BSN, RN, CWS, Kim RN, BSN Entered By: [name] on 03/13/2022 08:25:28 [name], [name].  ([id number]) -------------------------------------------------------------------------------- Pain Assessment Details Patient Name: [name], [name]. Date of Service: 03/13/2022 8:15 AM Medical Record Number: [id number] Patient Account Number: [id number] Date of Birth/Sex: Aug 24, 1955 (66 y.o. M) Treating RN: [name] Primary Care Tasheem Elms: [name] Other Clinician: Massie [name] Referring Anjanae Woehrle: [name] Treating Kimberle Stanfill/Extender: [name] in [name]: 31 Active Problems Location of Pain Severity and Description of Pain Patient Has Paino No Site Locations Pain Management and Medication Current Pain Management: Electronic Signature(s) Signed: 03/13/2022 5:09:23 PM By: [name] Signed: 03/13/2022 5:56:59 PM By: Gretta Cool, BSN, RN, CWS, Kim RN, BSN Entered By: [name] on 03/13/2022 08:23:20 [name], [name]. ([id number]) -------------------------------------------------------------------------------- Patient/Caregiver Education Details Patient Name: [name], [name]. Date of Service: 03/13/2022 8:15 AM Medical Record Number: [id number] Patient Account Number: [id number] Date of Birth/Gender: 1956/01/10 (67 y.o. M) Treating RN: [name] Primary Care Physician: [name] Other Clinician: Massie [name] Referring Physician: Alysia [name] Treating Physician/Extender: [name] in [name]: 40 Education Assessment Education Provided To: Patient Education Topics Provided Wound/Skin Impairment: Handouts: Other: continue wound care as directed Methods: Explain/Verbal Responses: State content correctly Electronic Signature(s) Signed: 03/13/2022 5:09:23 PM By: [name] Entered By: [name] on 03/13/2022 09:02:29 [name], [name]. ([id number]) -------------------------------------------------------------------------------- Wound Assessment Details Patient Name: [name], [name]. Date of Service: 03/13/2022 8:15 AM Medical Record Number:  [id number] Patient Account Number: [id number] Date of Birth/Sex: 24-Oct-1955 (66 y.o. M) Treating RN: [name] Primary Care Malonie Tatum: [name] Other Clinician: Massie [name] Referring Caren Garske: [name] Treating Ammaar Encina/Extender: [name] in [name]: 31 Wound Status Wound Number: 1 Primary Etiology: Diabetic Wound/Ulcer of the Lower Extremity Wound Location: Left Calcaneus Wound Status: Open Wounding Event: Pressure Injury Comorbid Cataracts, Hypertension, Type II Diabetes, History: Neuropathy Date Acquired: 06/19/2021 Weeks Of [name]: 31 Clustered Wound: No Photos Wound Measurements Length: (cm) 2.7 Width: (cm) 3 Depth: (cm) 0.1 Area: (cm) 6.362 Volume: (cm) 0.636 % Reduction in Area: -180.3% % Reduction in Volume: -180.2% Epithelialization: Small (1-33%) Wound Description Classification: Grade 2 Exudate Amount: Medium Exudate Type: Serosanguineous Exudate Color: red, brown Foul Odor After Cleansing: No Slough/Fibrino Yes Wound Bed Granulation Amount: Medium (34-66%) Exposed Structure Granulation Quality: Red, Hyper-granulation Fat Layer (Subcutaneous Tissue) Exposed: Yes Necrotic Amount: Medium (34-66%) Necrotic Quality: Adherent Slough [name] Notes Wound #1 (Calcaneus) Wound Laterality: Left Cleanser Normal Saline Discharge Instruction: Wash your hands with soap and water. Remove old dressing, discard into plastic bag and place into trash. Cleanse the wound with Normal Saline prior to applying a clean dressing using gauze sponges, not tissues or cotton balls. Do not scrub or use excessive force. Pat dry using gauze sponges, not tissue or cotton balls. Peri-Wound Care Topical Akre, Trong [name]. ([id number]) Primary Dressing Grafix Prime Discharge Instruction: #2 Secondary  Dressing ABD Pad 5x9 (in/in) Discharge Instruction: Cover with ABD pad Drawtex Hydroconductive Wound Dressing,o2x2 (in/in) Discharge Instruction: Apply to wound with  drainage. Kerlix 4.5 x 4.1 (in/yd) Discharge Instruction: Apply Kerlix 4.5 x 4.1 (in/yd) as instructed Secured With [employer] Sterile or Non-Sterile 6-ply 4.5x4 (yd/yd) Discharge Instruction: Apply Kerlix as directed Compression Wrap Compression Stockings Add-Ons Electronic Signature(s) Signed: 03/13/2022 5:09:23 PM By: [name] Signed: 03/13/2022 5:56:59 PM By: Gretta Cool, BSN, RN, CWS, Kim RN, BSN Entered By: [name] on 03/13/2022 08:24:11 Byron, Aeson [name]. ([id number]) -------------------------------------------------------------------------------- Vitals Details Patient Name: Buster, Detrell [name]. Date of Service: 03/13/2022 8:15 AM Medical Record Number: [id number] Patient Account Number: [id number] Date of Birth/Sex: 03/01/1956 (66 y.o. M) Treating RN: [name] Primary Care Jahzier Villalon: [name] Other Clinician: Massie [name] Referring Mitsugi Schrader: [name] Treating Aymee Fomby/Extender: [name] in [name]: 31 Vital Signs Time Taken: 08:15 Temperature (F): 98.2 Height (in): 68 Pulse (bpm): 80 Weight (lbs): 220 Respiratory Rate (breaths/min): 16 Body Mass Index (BMI): 33.4 Blood Pressure (mmHg): 169/88 Reference Range: 80 - 120 mg / dl Electronic Signature(s) Signed: 03/13/2022 5:09:23 PM By: [name] Entered By: [name] on 03/13/2022 08:23:17

## 2022-03-19 ENCOUNTER — Other Ambulatory Visit: Payer: Self-pay | Admitting: Family Medicine

## 2022-03-19 ENCOUNTER — Encounter: Payer: Self-pay | Admitting: Family Medicine

## 2022-03-20 ENCOUNTER — Encounter: Payer: No Typology Code available for payment source | Attending: Physician Assistant | Admitting: Physician Assistant

## 2022-03-20 DIAGNOSIS — E1151 Type 2 diabetes mellitus with diabetic peripheral angiopathy without gangrene: Secondary | ICD-10-CM | POA: Insufficient documentation

## 2022-03-20 DIAGNOSIS — M86672 Other chronic osteomyelitis, left ankle and foot: Secondary | ICD-10-CM | POA: Insufficient documentation

## 2022-03-20 DIAGNOSIS — E1169 Type 2 diabetes mellitus with other specified complication: Secondary | ICD-10-CM | POA: Insufficient documentation

## 2022-03-20 DIAGNOSIS — I10 Essential (primary) hypertension: Secondary | ICD-10-CM | POA: Insufficient documentation

## 2022-03-20 DIAGNOSIS — E11621 Type 2 diabetes mellitus with foot ulcer: Secondary | ICD-10-CM | POA: Diagnosis present

## 2022-03-20 DIAGNOSIS — I872 Venous insufficiency (chronic) (peripheral): Secondary | ICD-10-CM | POA: Insufficient documentation

## 2022-03-20 DIAGNOSIS — L97422 Non-pressure chronic ulcer of left heel and midfoot with fat layer exposed: Secondary | ICD-10-CM | POA: Diagnosis not present

## 2022-03-20 MED ORDER — TEMAZEPAM 30 MG PO CAPS: ORAL_CAPSULE | ORAL | 1 refills | Status: DC

## 2022-03-20 NOTE — Progress Notes (Addendum)
[name], [name] ([id number]) Visit Report for 03/20/2022 Chief Complaint Document Details Patient Name: [name], [name]. Date of Service: 03/20/2022 8:15 AM Medical Record Number: [id number] Patient Account Number: [id number] Date of Birth/Sex: 25-Oct-1955 (66 y.o. M) Treating RN: Huel Coventry Primary Care Provider: [name] Other Clinician: Betha Loa Referring Provider: [name] Treating Provider/Extender: Rowan Blase in Treatment: 32 Information Obtained from: Patient Chief Complaint Left heel ulcer Electronic Signature(s) Signed: 03/20/2022 8:30:59 AM By: Lenda Kelp PA-C Entered By: Lenda Kelp on 03/20/2022 08:30:59 [name], [name]. ([id number]) -------------------------------------------------------------------------------- Debridement Details Patient Name: [name], [name]. Date of Service: 03/20/2022 8:15 AM Medical Record Number: [id number] Patient Account Number: [id number] Date of Birth/Sex: Sep 04, 1955 (66 y.o. M) Treating RN: Huel Coventry Primary Care Provider: [name] Other Clinician: Betha Loa Referring Provider: [name] Treating Provider/Extender: Rowan Blase in Treatment: 32 Debridement Performed for Wound #1 Left Calcaneus Assessment: Performed By: Physician Nelida Meuse., PA-C Debridement Type: Debridement Severity of Tissue Pre Debridement: Fat layer exposed Level of Consciousness (Pre- Awake and Alert procedure): Pre-procedure Verification/Time Out Yes - 08:37 Taken: Start Time: 08:37 Total Area Debrided (L x [name]): 3.5 (cm) x 3.5 (cm) = 12.25 (cm) Tissue and other material Viable, Non-Viable, Callus, Slough, Subcutaneous, Slough debrided: Level: Skin/Subcutaneous Tissue Debridement Description: Excisional Instrument: Curette Bleeding: Minimum Hemostasis Achieved: Pressure End Time: 08:42 Response to Treatment: Procedure was tolerated well Level of Consciousness (Post- Awake and Alert procedure): Post Debridement  Measurements of Total Wound Length: (cm) 3.2 Width: (cm) 3.1 Depth: (cm) 0.3 Volume: (cm) 2.337 Character of Wound/Ulcer Post Debridement: Stable Severity of Tissue Post Debridement: Fat layer exposed Post Procedure Diagnosis Same as Pre-procedure Electronic Signature(s) Signed: 03/20/2022 1:10:05 PM By: Elliot Gurney, BSN, RN, CWS, Kim RN, BSN Signed: 03/20/2022 5:04:14 PM By: Betha Loa Signed: 03/20/2022 5:13:16 PM By: Lenda Kelp PA-C Entered By: Betha Loa on 03/20/2022 08:43:44 [name], [name]. ([id number]) -------------------------------------------------------------------------------- HPI Details Patient Name: [name], [name]. Date of Service: 03/20/2022 8:15 AM Medical Record Number: [id number] Patient Account Number: [id number] Date of Birth/Sex: [birth year] (66 y.o. M) Treating RN: Huel Coventry Primary Care Provider: [name] Other Clinician: Betha Loa Referring Provider: [name] Treating Provider/Extender: Rowan Blase in Treatment: 32 History of Present Illness HPI Description: 08/08/2021 upon evaluation today patient appears to be doing somewhat poorly in regard to a heel ulcer that began as a result of the patient tells me him trying to get his foot into his shoes that were somewhat tight. This was around 4 January. He was using a shoehorn fairly aggressively and thinks that he may have caused the bruising at that time. Fortunately there does not appear to be any signs of infection at this point. Nonetheless he does have some definite necrotic tissue. This can need to be cleared away. Nonetheless unfortunately [name] we do not have clearance in my opinion therefore to go forward with an aggressive sharp debridement. Patient does have diabetes mellitus type 2 with the most recent hemoglobin A1c of 5.2 and that was on June 2022. He also has a history of chronic venous insufficiency, peripheral vascular disease, and hypertension. Obviously  with regard to the peripheral vascular disease we will continue to send him to vascular to quantify his ABIs and TBI's. 08/22/2021 upon evaluation today patient appears to be doing well with regard to his wound. This is definitely showing some signs of improvement which is great news and overall very pleased with where things stand today. There  does not appear to be any evidence of active infection locally nor systemically which is great news and overall I feel like that this is showing signs of improvement. It is definitely much less irritated as compared to last week. I am very pleased in that regard I am good to be able to get a lot of this dry skin off today as well which is great news. 09/05/2021 upon evaluation today patient's heel actually showing signs of some improvement though there is still a bit of eschar noted in the center part of the wound. I do believe this is going require some sharp debridement to clear this away. The Iodosorb just is not quite cutting it. Fortunately there does not appear to be any signs of active infection at this time locally nor systemically. 09/12/2021 upon evaluation today patient appears to be doing well with regard to his wound. He has been tolerating the dressing changes I do believe that clearing off the eschar last week has been to help there is still some necrotic tissue removed today overall he seems to be doing quite well however. 09/20/2021 upon evaluation today patient appears to be doing well with regard to his wound he does have some necrotic tissue noted on the surface of the wound but I do feel like we are getting better here definitely making good progress. I do not see any signs of infection which is great news and overall I think that we are on the right track. 09-26-2021 upon evaluation today patient appears to be doing well with regard to his heel ulcer. This is actually measuring smaller and looking better today. Fortunately I do not see any  evidence of active infection locally or systemically which is great news. 10-03-2021 upon evaluation today patient appears to be doing well currently in regard to his heel for the most part although he does have a little bit of what appears to be bruising in the base of the wound. I am not sure exactly where to get the pressure from but this very likely is happening when he is walking he tells me has been wearing some other shoes been a little bit more on his feet than normal. Fortunately I do not see any evidence of active infection at this time. I have contemplated put him in a different type of shoe or even a total contact cast to be honest. With that being said his balance is not good and I therefore have some concerns in that regard. 10-17-2021 upon evaluation today patient appears to be doing poorly in regard to his heel. Again this is showing signs of significant deep tissue injury which is bringing into question whether or not he may actually have something going on here which is more related to his blood flow. We have previously done arterial study though to be honest with ABIs right at 1 and noncompressible which tends to make me think this may be falsely elevated coupled with TBI's which were at 0.5 with biphasic waveforms I am not certain he is getting the blood flow needed to completely heal this wound. I discussed with him today that he may need to see vascular in order to discuss options for her further angiogram/evaluation in general. I would at least leave it up to them as to whether they feel the angiogram is necessary or not. Nonetheless I do believe pressure is also playing a component to this he does tell me he started sleeping with his heel on the bed not  raised underneath the pillow already this was dark and discolored that may have been what triggered this to get worse I am not really sure. 5/8; the wound seems to have less of a deep tissue injury but there is still eschar. Scant  amount of purulent drainage some erythema medially extending up the heel. He has neuropathy no pain no systemic illness. Culture that we did last time showed moderate Proteus, moderate Enterococcus and moderate Pseudomonas. The Enterococcus will require ampicillin so he will require 2 antibiotics. He is using Santyl to the wound 10-31-2021 upon evaluation today patient appears to be doing well with regard to his wound all things considered. There is still necrotic tissue noted in the central portion of the wound which is not what we want to see but unfortunately I think this is just could be the case until we ensure he has good blood flow and can subsequently get things moving in a better direction. Fortunately I do not see any evidence of infection locally or systemically which is great news and overall I think that we are on the right track in that regard. I am going to go ahead and refill the Augmentin as well as the Cipro for him today he was given originally 10 days worth I would have her extend that for 7 days. 11-07-2021 upon evaluation today patient appears to be doing better in regard to the overall appearance of his wound and I do feel like the Melburn Popper is doing a good job here. Fortunately there does not appear to be any evidence of active infection locally nor systemically which is great news and overall I am extremely pleased with where things stand today. The patient is happy as well with the progress. Nonetheless I had extended his Augmentin and Cipro for him at the last visit I think that is going to need to be extended a bit further. That means he is probably can need some probiotics as well. 11-21-2021 upon evaluation today patient appears to be doing a little better in regard to size and is definitely making some progress here. With that being said he also definitely has some areas that are still fairly thick with necrotic tissue. We discussed debridement yet again today and he is Kief,  Darien [name]. ([id number]) amenable to doing some debridement as Dieguez as its not too aggressive which I think we can definitely do. I do believe the Melburn Popper is doing a great job and will definitely be continuing that as well. 12-05-2021 upon evaluation today patient appears to be doing well with regard to his heel. He has been tolerating the dressing changes without complication. Fortunately there does not appear to be any evidence of active infection locally or systemically at this time. No fevers, chills, nausea, vomiting, or diarrhea. 12-19-2021 upon evaluation today patient appears to be doing excellent in regard to his wound. Has been tolerating the Santyl in an excellent fashion. Fortunately I do not see any evidence of infection locally or systemically which is great news and overall I am very pleased with where we stand today. 01-02-2022 upon evaluation today patient appears to be doing excellent in regard to his wound. He has been tolerating the dressing changes without complication. Fortunately I do not see any evidence of active infection there is minimal amount of slough and biofilm buildup which I Minna perform debridement in regard to today but other than this overall I really feel like that the patient is doing excellent and I think he is  ready to switch to a collagen based dressing. I think this is going to do well for him. 01-19-2022 upon evaluation today patient appears to be doing much better in regard to his wound. He has been tolerating the dressing changes without complication. Fortunately there does not appear to be any evidence of active infection locally or systemically at this time which is great news and overall I am extremely pleased with where we stand. There does not appear to be any evidence of active infection whatsoever which is great news. No fevers, chills, nausea, vomiting, or diarrhea. 01-30-2022 upon evaluation today patient appears to be doing well currently in regard to his  wound. This is actually showing signs of excellent improvement I am very pleased based on what we are seeing today. I do not see any evidence of active infection locally or systemically at this time. 02-13-2022 upon evaluation today patient appears to be doing excellent in regard to his wound. This is showing signs of a little bit of maceration otherwise it seems to be a little bit smaller which is great news. Overall very pleased with where things stand at this point. 02-27-2022 upon evaluation today patient appears to be doing well currently in regard to his wound. He has been tolerating the dressing changes without complication. Fortunately I see no evidence of active infection locally or systemically at this time which is great news. No fevers, chills, nausea, vomiting, or diarrhea. 03-06-2022 upon evaluation today patient appears to be doing excellent in regard to his wound. In fact this is showing signs of improvement even compared to the last time I saw him. I am very pleased in that regard. Fortunately I do not see any evidence of active infection locally or systemically at this time which is great news. No fevers, chills, nausea, vomiting, or diarrhea. We do have the first graphics available for application today. 03-13-2022 upon evaluation today patient appears to be doing well other than the fact that his wound does appear to be very macerated. I think that we can have to use roll gauze to secure in place and with an ABD pad instead of doing the bordered foam dressing although I know this is easier is also I think can be more problematic from a moisture standpoint. Fortunately I do not see any signs of active infection locally nor systemically and I do believe the wound bed itself looks to be doing much better. 03-20-2022 upon evaluation today patient appears to be doing well currently in regard to the overall appearance of his wound although unfortunately he is not doing nearly as well with  regard to the drainage. He has a lot of drainage which in turn is causing maceration around the edges of the wound attempted to cut the Mepitel differently so that it would drain through the middle better and collect outside and not actually irritate the edges as much last time. With that being said that did not seem to work and this still is larger today based on the irritation around the edges. Everything else looks to be doing well and I am pleased in that regard my hope would been that this would help to get this closed faster but I think that with the amount of drainage that he has that is just not working out the way we want. Electronic Signature(s) Signed: 03/20/2022 9:08:20 AM By: Lenda Kelp PA-C Entered By: Lenda Kelp on 03/20/2022 09:08:20 [name], [name]. ([id number]) -------------------------------------------------------------------------------- Physical Exam Details Patient Name: [name],  [name]. Date of Service: 03/20/2022 8:15 AM Medical Record Number: [id number] Patient Account Number: [id number] Date of Birth/Sex: 10/01/1955 (66 y.o. M) Treating RN: Huel Coventry Primary Care Provider: [name] Other Clinician: Betha Loa Referring Provider: [name] Treating Provider/Extender: Allen Derry Weeks in Treatment: 32 Constitutional Well-nourished and well-hydrated in no acute distress. Respiratory normal breathing without difficulty. Psychiatric this patient is able to make decisions and demonstrates good insight into disease process. Alert and Oriented x 3. pleasant and cooperative. Notes Upon inspection patient's wound bed again showed signs of a healthier surface overall which I am extremely pleased about. With that being said unfortunately he is also having a lot of issues here with drainage and I think this needs to be changed more frequently with out having the Mepitel in place that is causing it to trapped fluid underneath. Electronic Signature(s) Signed:  03/20/2022 9:08:50 AM By: Lenda Kelp PA-C Entered By: Lenda Kelp on 03/20/2022 09:08:50 [name], [name]. ([id number]) -------------------------------------------------------------------------------- Physician Orders Details Patient Name: [name], [name]. Date of Service: 03/20/2022 8:15 AM Medical Record Number: [id number] Patient Account Number: [id number] Date of Birth/Sex: 06/24/1955 (66 y.o. 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 o Nurse Visit as needed [facility] o Wash wounds with antibacterial soap and water. o May shower; gently cleanse wound with antibacterial soap, rinse and pat dry prior to dressing wounds - shower with dressing on and remove after shower o No tub bath. Anesthetic (Use 'Patient Medications' Section for Anesthetic Order Entry) o Lidocaine applied to wound bed Edema Control - Lymphedema / Segmental Compressive Device / Other o Elevate, Exercise Daily and Avoid Standing for Venturini Periods of Time. o Elevate leg(s) parallel to the floor when sitting. o DO YOUR BEST to sleep in the bed at night. DO NOT sleep in your recliner. Solarz hours of sitting in a recliner leads to swelling of the legs and/or potential wounds on your backside. Wound Treatment Wound #1 - Calcaneus Wound Laterality: Left Cleanser: Normal Saline Every Other  Day/30 Days Discharge Instructions: Wash your hands with soap and water. Remove old dressing, discard into plastic bag and place into trash. Cleanse the wound with Normal Saline prior to applying a clean dressing using gauze sponges, not tissues or cotton balls. Do not scrub or use excessive force. Pat dry using gauze sponges, not tissue or cotton balls. Primary Dressing: Prisma 4.34 (in) (DME) (Dispense As Written) Every Other Day/30 Days Discharge Instructions: Moisten [name]/normal saline or sterile water; Cover wound as directed. Do not remove from wound bed. Secondary Dressing: ABD Pad 5x9 (in/in) (DME) (Generic) Every Other Day/30 Days Discharge Instructions: Cover with ABD pad Secondary Dressing: Drawtex Hydroconductive Wound Dressing,o2x2 (in/in) Every Other Day/30 Days Discharge Instructions: Apply to wound with drainage. Secondary Dressing: Gauze (DME) (Generic) Every Other Day/30 Days Discharge Instructions: As directed: dry, moistened with saline or moistened with Dakins Solution Secondary Dressing: (BORDER) Zetuvit Plus SILICONE BORDER Dressing 4x4 (in/in) (DME) (Dispense As Written) Every Other Day/30 Days Discharge Instructions: Please do  not put silicone bordered dressings under wraps. Use non-bordered dressing only. Secured With: Medipore Tape - 83M Medipore H Soft Cloth Surgical Tape, 2x2 (in/yd) Every Other Day/30 Days Secured With: Kerlix Roll Sterile or Non-Sterile 6-ply 4.5x4 (yd/yd) Every Other Day/30 Days Discharge Instructions: Apply Kerlix as directed Faiella, Blayne Viona Gilmore ([id number]) Electronic Signature(s) Signed: 03/20/2022 5:04:14 PM By: Massie Kluver Signed: 03/20/2022 5:13:16 PM By: Worthy Keeler PA-C Entered By: Massie Kluver on 03/20/2022 08:48:24 Constancio, Thatcher [name]. ([id number]) -------------------------------------------------------------------------------- Problem List Details Patient Name: [name], [name]. Date of Service: 03/20/2022 8:15 AM Medical Record Number:  [id number] Patient Account Number: [id number] Date of Birth/Sex: 1955/09/23 (66 y.o. M) Treating RN: Cornell Barman Primary Care Provider: Alysia Penna Other Clinician: Massie Kluver Referring Provider: Alysia Penna Treating Provider/Extender: Jeri Cos Weeks in Treatment: 32 Active Problems ICD-10 Encounter Code Description Active Date MDM Diagnosis E11.621 Type 2 diabetes mellitus with foot ulcer 08/08/2021 No Yes L97.422 Non-pressure chronic ulcer of left heel and midfoot with fat layer 08/08/2021 No Yes exposed I71.245 Other chronic osteomyelitis, left ankle and foot 11/07/2021 No Yes I87.2 Venous insufficiency (chronic) (peripheral) 08/08/2021 No Yes I73.89 Other specified peripheral vascular diseases 08/08/2021 No Yes I10 Essential (primary) hypertension 08/08/2021 No Yes Inactive Problems Resolved Problems Electronic Signature(s) Signed: 03/20/2022 8:30:54 AM By: Worthy Keeler PA-C Entered By: Worthy Keeler on 03/20/2022 08:30:54 Ortega, Zebulan [name]. ([id number]) -------------------------------------------------------------------------------- Progress Note Details Patient Name: [name], [name]. Date of Service: 03/20/2022 8:15 AM Medical Record Number: [id number] Patient Account Number: [id number] Date of Birth/Sex: 1955/10/14 (66 y.o. M) Treating RN: Cornell Barman Primary Care Provider: Alysia Penna Other Clinician: Massie Kluver Referring Provider: Alysia Penna Treating Provider/Extender: Skipper Cliche in Treatment: 32 Subjective Chief Complaint Information obtained from Patient Left heel ulcer History of Present Illness (HPI) 08/08/2021 upon evaluation today patient appears to be doing somewhat poorly in regard to a heel ulcer that began as a result of the patient tells me him trying to get his foot into his shoes that were somewhat tight. This was around 4 January. He was using a shoehorn fairly aggressively and thinks that he may have caused the bruising at that time.  Fortunately there does not appear to be any signs of infection at this point. Nonetheless he does have some definite necrotic tissue. This can need to be cleared away. Nonetheless unfortunately [name] we do not have clearance in my opinion therefore to go forward with an aggressive sharp debridement. Patient does have diabetes mellitus type 2 with the most recent hemoglobin A1c of 5.2 and that was on June 2022. He also has a history of chronic venous insufficiency, peripheral vascular disease, and hypertension. Obviously with regard to the peripheral vascular disease we will continue to send him to vascular to quantify his ABIs and TBI's. 08/22/2021 upon evaluation today patient appears to be doing well with regard to his wound. This is definitely showing some signs of improvement which is great news and overall very pleased with where things stand today. There does not appear to be any evidence of active infection locally nor systemically which is great news and overall I feel like that this is showing signs of improvement. It is definitely much less irritated as compared to last week. I am very pleased in that regard I am good to be able to get a lot of this dry skin off today as well which is great news. 09/05/2021 upon evaluation today patient's heel actually showing signs of some improvement though  there is still a bit of eschar noted in the center part of the wound. I do believe this is going require some sharp debridement to clear this away. The Iodosorb just is not quite cutting it. Fortunately there does not appear to be any signs of active infection at this time locally nor systemically. 09/12/2021 upon evaluation today patient appears to be doing well with regard to his wound. He has been tolerating the dressing changes I do believe that clearing off the eschar last week has been to help there is still some necrotic tissue removed today overall he seems to be doing  quite well however. 09/20/2021 upon evaluation today patient appears to be doing well with regard to his wound he does have some necrotic tissue noted on the surface of the wound but I do feel like we are getting better here definitely making good progress. I do not see any signs of infection which is great news and overall I think that we are on the right track. 09-26-2021 upon evaluation today patient appears to be doing well with regard to his heel ulcer. This is actually measuring smaller and looking better today. Fortunately I do not see any evidence of active infection locally or systemically which is great news. 10-03-2021 upon evaluation today patient appears to be doing well currently in regard to his heel for the most part although he does have a little bit of what appears to be bruising in the base of the wound. I am not sure exactly where to get the pressure from but this very likely is happening when he is walking he tells me has been wearing some other shoes been a little bit more on his feet than normal. Fortunately I do not see any evidence of active infection at this time. I have contemplated put him in a different type of shoe or even a total contact cast to be honest. With that being said his balance is not good and I therefore have some concerns in that regard. 10-17-2021 upon evaluation today patient appears to be doing poorly in regard to his heel. Again this is showing signs of significant deep tissue injury which is bringing into question whether or not he may actually have something going on here which is more related to his blood flow. We have previously done arterial study though to be honest with ABIs right at 1 and noncompressible which tends to make me think this may be falsely elevated coupled with TBI's which were at 0.5 with biphasic waveforms I am not certain he is getting the blood flow needed to completely heal this wound. I discussed with him today that he may need to  see vascular in order to discuss options for her further angiogram/evaluation in general. I would at least leave it up to them as to whether they feel the angiogram is necessary or not. Nonetheless I do believe pressure is also playing a component to this he does tell me he started sleeping with his heel on the bed not raised underneath the pillow already this was dark and discolored that may have been what triggered this to get worse I am not really sure. 5/8; the wound seems to have less of a deep tissue injury but there is still eschar. Scant amount of purulent drainage some erythema medially extending up the heel. He has neuropathy no pain no systemic illness. Culture that we did last time showed moderate Proteus, moderate Enterococcus and moderate Pseudomonas. The Enterococcus will require ampicillin  so he will require 2 antibiotics. He is using Santyl to the wound 10-31-2021 upon evaluation today patient appears to be doing well with regard to his wound all things considered. There is still necrotic tissue noted in the central portion of the wound which is not what we want to see but unfortunately I think this is just could be the case until we ensure he has good blood flow and can subsequently get things moving in a better direction. Fortunately I do not see any evidence of infection locally or systemically which is great news and overall I think that we are on the right track in that regard. I am going to go ahead and refill the Augmentin as well as the Cipro for him today he was given originally 10 days worth I would have her extend that for 7 days. 11-07-2021 upon evaluation today patient appears to be doing better in regard to the overall appearance of his wound and I do feel like the Melburn Popper is doing a good job here. Fortunately there does not appear to be any evidence of active infection locally nor systemically which is great news and overall I am extremely pleased with where things stand  today. The patient is happy as well with the progress. Nonetheless I had extended his Beagley, Avier [name]. ([id number]) Augmentin and Cipro for him at the last visit I think that is going to need to be extended a bit further. That means he is probably can need some probiotics as well. 11-21-2021 upon evaluation today patient appears to be doing a little better in regard to size and is definitely making some progress here. With that being said he also definitely has some areas that are still fairly thick with necrotic tissue. We discussed debridement yet again today and he is amenable to doing some debridement as [place] as its not too aggressive which I think we can definitely do. I do believe the Melburn Popper is doing a great job and will definitely be continuing that as well. 12-05-2021 upon evaluation today patient appears to be doing well with regard to his heel. He has been tolerating the dressing changes without complication. Fortunately there does not appear to be any evidence of active infection locally or systemically at this time. No fevers, chills, nausea, vomiting, or diarrhea. 12-19-2021 upon evaluation today patient appears to be doing excellent in regard to his wound. Has been tolerating the Santyl in an excellent fashion. Fortunately I do not see any evidence of infection locally or systemically which is great news and overall I am very pleased with where we stand today. 01-02-2022 upon evaluation today patient appears to be doing excellent in regard to his wound. He has been tolerating the dressing changes without complication. Fortunately I do not see any evidence of active infection there is minimal amount of slough and biofilm buildup which I Minna perform debridement in regard to today but other than this overall I really feel like that the patient is doing excellent and I think he is ready to switch to a collagen based dressing. I think this is going to do well for him. 01-19-2022 upon evaluation  today patient appears to be doing much better in regard to his wound. He has been tolerating the dressing changes without complication. Fortunately there does not appear to be any evidence of active infection locally or systemically at this time which is great news and overall I am extremely pleased with where we stand. There does not appear to be  any evidence of active infection whatsoever which is great news. No fevers, chills, nausea, vomiting, or diarrhea. 01-30-2022 upon evaluation today patient appears to be doing well currently in regard to his wound. This is actually showing signs of excellent improvement I am very pleased based on what we are seeing today. I do not see any evidence of active infection locally or systemically at this time. 02-13-2022 upon evaluation today patient appears to be doing excellent in regard to his wound. This is showing signs of a little bit of maceration otherwise it seems to be a little bit smaller which is great news. Overall very pleased with where things stand at this point. 02-27-2022 upon evaluation today patient appears to be doing well currently in regard to his wound. He has been tolerating the dressing changes without complication. Fortunately I see no evidence of active infection locally or systemically at this time which is great news. No fevers, chills, nausea, vomiting, or diarrhea. 03-06-2022 upon evaluation today patient appears to be doing excellent in regard to his wound. In fact this is showing signs of improvement even compared to the last time I saw him. I am very pleased in that regard. Fortunately I do not see any evidence of active infection locally or systemically at this time which is great news. No fevers, chills, nausea, vomiting, or diarrhea. We do have the first graphics available for application today. 03-13-2022 upon evaluation today patient appears to be doing well other than the fact that his wound does appear to be very macerated. I  think that we can have to use roll gauze to secure in place and with an ABD pad instead of doing the bordered foam dressing although I know this is easier is also I think can be more problematic from a moisture standpoint. Fortunately I do not see any signs of active infection locally nor systemically and I do believe the wound bed itself looks to be doing much better. 03-20-2022 upon evaluation today patient appears to be doing well currently in regard to the overall appearance of his wound although unfortunately he is not doing nearly as well with regard to the drainage. He has a lot of drainage which in turn is causing maceration around the edges of the wound attempted to cut the Mepitel differently so that it would drain through the middle better and collect outside and not actually irritate the edges as much last time. With that being said that did not seem to work and this still is larger today based on the irritation around the edges. Everything else looks to be doing well and I am pleased in that regard my hope would been that this would help to get this closed faster but I think that with the amount of drainage that he has that is just not working out the way we want. Objective Constitutional Well-nourished and well-hydrated in no acute distress. Vitals Time Taken: 8:15 AM, Height: 72 in, Temperature: 98.2 F, Pulse: 68 bpm, Respiratory Rate: 18 breaths/min, Blood Pressure: 189/79 mmHg. Respiratory normal breathing without difficulty. Psychiatric this patient is able to make decisions and demonstrates good insight into disease process. Alert and Oriented x 3. pleasant and cooperative. [name], [name]. ([id number]) General Notes: Upon inspection patient's wound bed again showed signs of a healthier surface overall which I am extremely pleased about. With that being said unfortunately he is also having a lot of issues here with drainage and I think this needs to be changed more frequently  with  out having the Mepitel in place that is causing it to trapped fluid underneath. Integumentary (Hair, Skin) Wound #1 status is Open. Original cause of wound was Pressure Injury. The date acquired was: 06/19/2021. The wound has been in treatment 32 weeks. The wound is located on the Left Calcaneus. The wound measures 3.2cm length x 3.1cm width x 0.3cm depth; 7.791cm^2 area and 2.337cm^3 volume. There is Fat Layer (Subcutaneous Tissue) exposed. There is a medium amount of serosanguineous drainage noted. There is medium (34-66%) red, hyper - granulation within the wound bed. There is a medium (34-66%) amount of necrotic tissue within the wound bed including Adherent Slough. 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 There was a Excisional Skin/Subcutaneous Tissue Debridement with a total area of 12.25 sq cm performed by Nelida Meuse., PA-C. With the following instrument(s): Curette to remove Viable and Non-Viable tissue/material. Material removed includes Callus, Subcutaneous Tissue, and Slough. A time out was conducted at 08:37, prior to the start of the procedure. A Minimum amount of bleeding was controlled with Pressure. The procedure was tolerated well. Post Debridement Measurements: 3.2cm length x 3.1cm width x 0.3cm depth; 2.337cm^3 volume. Character of Wound/Ulcer Post Debridement is stable. Severity of Tissue Post Debridement is: Fat layer exposed. Post procedure Diagnosis Wound #1: Same as  Pre-Procedure Plan Follow-up Appointments: Return Appointment in 1 week. Nurse Visit as needed Bathing/ Shower/ Hygiene: Wash wounds with antibacterial soap and water. May shower; gently cleanse wound with antibacterial soap, rinse and pat dry prior to dressing wounds - shower with dressing on and remove after shower No tub bath. Anesthetic (Use 'Patient Medications' Section for Anesthetic Order Entry): Lidocaine applied to wound bed Edema Control - Lymphedema / Segmental Compressive Device / Other: Elevate, Exercise Daily and Avoid Standing for Denn Periods of Time. Elevate leg(s) parallel to the floor when sitting. DO YOUR BEST to sleep in the bed at night. DO NOT sleep in your recliner. [place] hours of sitting in a recliner leads to swelling of the legs and/or potential wounds on your backside. WOUND #1: - Calcaneus Wound Laterality: Left Cleanser: Normal Saline Every Other Day/30 Days Discharge Instructions: Wash your hands with soap and water. Remove old dressing, discard into plastic bag and place into trash. Cleanse the wound with Normal Saline prior to applying a clean dressing using gauze sponges, not tissues or cotton balls. Do not scrub or use excessive force. Pat dry using gauze sponges, not tissue or cotton balls. Primary Dressing: Prisma 4.34 (in) (DME) (Dispense As Written) Every Other Day/30 Days Discharge Instructions: Moisten [name]/normal saline or sterile water; Cover wound as directed. Do not remove from wound bed. Secondary Dressing: ABD Pad 5x9 (in/in) (DME) (Generic) Every Other Day/30 Days Discharge Instructions: Cover with ABD pad Secondary Dressing: Drawtex Hydroconductive Wound Dressing, 2x2 (in/in) Every Other Day/30 Days [name], [name]. ([id number]) Discharge Instructions: Apply to wound with drainage. Secondary Dressing: Gauze (DME) (Generic) Every Other Day/30 Days Discharge Instructions: As directed: dry, moistened with saline or moistened with Dakins  Solution Secondary Dressing: (BORDER) Zetuvit Plus SILICONE BORDER Dressing 4x4 (in/in) (DME) (Dispense As Written) Every Other Day/30 Days  Discharge Instructions: Please do not put silicone bordered dressings under wraps. Use non-bordered dressing only. Secured With: Medipore Tape - 77M Medipore H Soft Cloth Surgical Tape, 2x2 (in/yd) Every Other Day/30 Days Secured With: Kerlix Roll Sterile or Non-Sterile 6-ply 4.5x4 (yd/yd) Every Other Day/30 Days Discharge Instructions: Apply Kerlix as directed 1. I am good recommend currently that we have the patient go ahead and continue to monitor for any signs of worsening or infection. Obviously if anything changes he should contact the office and let me know. My hope however is this will continue to improve with the antibiotics. 2. I am also can recommend that we have the patient continue to monitor for any signs of infection systemically and he knows what to look for there. 3. We will switch to a silver collagen dressing which I think is can be a good way to go. 4. Also can recommend that we have the patient continue with the Zetuvit bordered foam dressing to cover. 5. We will also initiate the use of a peg assist offloading shoe. We will see patient back for reevaluation in 1 week here in the clinic. If anything worsens or changes patient will contact our office for additional recommendations. Electronic Signature(s) Signed: 03/20/2022 9:15:01 AM By: Lenda Kelp PA-C Entered By: Lenda Kelp on 03/20/2022 09:15:01 Harbach, Olufemi [name]. ([id number]) -------------------------------------------------------------------------------- SuperBill Details Patient Name: Rosebrook, Janie [name]. Date of Service: 03/20/2022 Medical Record Number: [id number] Patient Account Number: [id number] Date of Birth/Sex: July 03, 1955 (66 y.o. M) Treating RN: Huel Coventry Primary Care Provider: [name] Other Clinician: Betha Loa Referring Provider: [name] Treating  Provider/Extender: Allen Derry Weeks in Treatment: 32 Diagnosis Coding ICD-10 Codes Code Description E11.621 Type 2 diabetes mellitus with foot ulcer L97.422 Non-pressure chronic ulcer of left heel and midfoot with fat layer exposed M86.672 Other chronic osteomyelitis, left ankle and foot I87.2 Venous insufficiency (chronic) (peripheral) I73.89 Other specified peripheral vascular diseases I10 Essential (primary) hypertension Facility Procedures CPT4 Code: [id number] Description: 11042 - DEB SUBQ TISSUE 20 SQ CM/< Modifier: Quantity: 1 CPT4 Code: Description: ICD-10 Diagnosis Description L97.422 Non-pressure chronic ulcer of left heel and midfoot with fat layer exp Modifier: osed Quantity: Physician Procedures CPT4 Code: [id number] Description: 11042 - WC PHYS SUBQ TISS 20 SQ CM Modifier: Quantity: 1 CPT4 Code: Description: ICD-10 Diagnosis Description L97.422 Non-pressure chronic ulcer of left heel and midfoot with fat layer exp Modifier: osed Quantity: Electronic Signature(s) Signed: 03/20/2022 9:15:11 AM By: Lenda Kelp PA-C Entered By: Lenda Kelp on 03/20/2022 09:15:11

## 2022-03-20 NOTE — Telephone Encounter (Signed)
Done

## 2022-03-21 NOTE — Progress Notes (Signed)
[name] ([id number]) Visit Report for 03/20/2022 Arrival Information Details Patient Name: [name], [name] Date of Service: 03/20/2022 8:15 AM Medical Record Number: [id number] Patient Account Number: [id number] Date of Birth/Sex: [birth year] (66 y.o. M) Treating RN: Cornell Barman Primary Care Iliany Losier: Alysia Penna Other Clinician: Massie Kluver Referring Dayson Aboud: Alysia Penna Treating Regis Hinton/Extender: Skipper Cliche in Treatment: 24 Visit Information History Since Last Visit All ordered tests and consults were completed: No Patient Arrived: [name] Added or deleted any medications: No Arrival Time: 08:13 Any new allergies or adverse reactions: No Transfer Assistance: None Had a fall or experienced change in No Patient Requires Transmission-Based No activities of daily living that may affect Precautions: risk of falls: Patient Has Alerts: Yes Hospitalized since last visit: No Patient Alerts: ABI R >1.0 [place] TBI Pain Present Now: No 0.50 ABI L 0.98 Electronic Signature(s) Signed: 03/20/2022 5:04:14 PM By: Massie Kluver Entered By: Massie Kluver on 03/20/2022 08:13:45 Freitas, Ireland W. ([id number]) -------------------------------------------------------------------------------- Clinic Level of Care Assessment Details Patient Name: [name], [name] Date of Service: 03/20/2022 8:15 AM Medical Record Number: [id number] Patient Account Number: [id number] Date of Birth/Sex: 11-Jun-1956 (67 y.o. M) Treating RN: Cornell Barman Primary Care Marshea Wisher: Alysia Penna Other Clinician: Massie Kluver Referring Levenia Skalicky: Alysia Penna Treating Rawlins Stuard/Extender: Skipper Cliche in Treatment: 32 Clinic Level of Care Assessment Items TOOL 1 Quantity Score []  - Use when EandM and Procedure is performed on INITIAL visit 0 ASSESSMENTS - Nursing Assessment / Reassessment []  - General Physical Exam (combine w/ comprehensive assessment (listed just below) when performed on new 0 pt. evals) []  -  0 Comprehensive Assessment (HX, ROS, Risk Assessments, Wounds Hx, etc.) ASSESSMENTS - Wound and Skin Assessment / Reassessment []  - Dermatologic / Skin Assessment (not related to wound area) 0 ASSESSMENTS - Ostomy and/or Continence Assessment and Care []  - Incontinence Assessment and Management 0 []  - 0 Ostomy Care Assessment and Management (repouching, etc.) PROCESS - Coordination of Care []  - Simple Patient / Family Education for ongoing care 0 []  - 0 Complex (extensive) Patient / Family Education for ongoing care []  - 0 Staff obtains Programmer, systems, Records, Test Results / Process Orders []  - 0 Staff telephones HHA, Nursing Homes / Clarify orders / etc []  - 0 Routine Transfer to another Facility (non-emergent condition) []  - 0 Routine Hospital Admission (non-emergent condition) []  - 0 New Admissions / Biomedical engineer / Ordering NPWT, Apligraf, etc. []  - 0 Emergency Hospital Admission (emergent condition) PROCESS - Special Needs []  - Pediatric / Minor Patient Management 0 []  - 0 Isolation Patient Management []  - 0 Hearing / Language / Visual special needs []  - 0 Assessment of Community assistance (transportation, D/C planning, etc.) []  - 0 Additional assistance / Altered mentation []  - 0 Support Surface(s) Assessment (bed, cushion, seat, etc.) INTERVENTIONS - Miscellaneous []  - External ear exam 0 []  - 0 Patient Transfer (multiple staff / Civil Service fast streamer / Similar devices) []  - 0 Simple Staple / Suture removal (25 or less) []  - 0 Complex Staple / Suture removal (26 or more) []  - 0 Hypo/Hyperglycemic Management (do not check if billed separately) []  - 0 Ankle / Brachial Index (ABI) - do not check if billed separately Has the patient been seen at the hospital within the last three years: Yes Total Score: 0 Level Of Care: ____ [name] ([id number]) Electronic Signature(s) Signed: 03/20/2022 5:04:14 PM By: Massie Kluver Entered By: Massie Kluver on 03/20/2022  08:48:29 Winegar, Burtis W. ([id number]) -------------------------------------------------------------------------------- Encounter Discharge Information Details Patient  Name: [name], [name] Date of Service: 03/20/2022 8:15 AM Medical Record Number: [id number] Patient Account Number: [id number] Date of Birth/Sex: March 08, 1956 (66 y.o. M) Treating RN: Cornell Barman Primary Care Tamana Hatfield: Alysia Penna Other Clinician: Massie Kluver Referring Ambrosia Wisnewski: Alysia Penna Treating Lyn Joens/Extender: Skipper Cliche in Treatment: 32 Encounter Discharge Information Items Post Procedure Vitals Discharge Condition: Stable Temperature (F): 98.2 Ambulatory Status: Cane Pulse (bpm): 69 Discharge Destination: Home Respiratory Rate (breaths/min): 18 Transportation: Private Auto Blood Pressure (mmHg): 189/79 Accompanied By: self Schedule Follow-up Appointment: Yes Clinical Summary of Care: Electronic Signature(s) Signed: 03/20/2022 5:04:14 PM By: Massie Kluver Entered By: Massie Kluver on 03/20/2022 08:58:39 Shampine, Grason W. ([id number]) -------------------------------------------------------------------------------- Lower Extremity Assessment Details Patient Name: Cuyler, Gussie W. Date of Service: 03/20/2022 8:15 AM Medical Record Number: [id number] Patient Account Number: [id number] Date of Birth/Sex: December 22, 1955 (66 y.o. M) Treating RN: Cornell Barman Primary Care Marquis Diles: Alysia Penna Other Clinician: Massie Kluver Referring Genesi Stefanko: Alysia Penna Treating Donnalynn Wheeless/Extender: Jeri Cos Weeks in Treatment: 32 Edema Assessment Assessed: Shirlyn Goltz: Yes] Patrice Paradise: No] Edema: [Left: Ye] [Right: s] Calf Left: Right: Point of Measurement: 30 cm From Medial Instep 36.8 cm Ankle Left: Right: Point of Measurement: 10 cm From Medial Instep 23 cm Vascular Assessment Pulses: Dorsalis Pedis Palpable: [Left:Yes] Posterior Tibial Palpable: [Left:Yes] Electronic Signature(s) Signed: 03/20/2022 1:10:05 PM By: Gretta Cool,  BSN, RN, CWS, Kim RN, BSN Signed: 03/20/2022 5:04:14 PM By: Massie Kluver Entered By: Massie Kluver on 03/20/2022 08:24:19 Roselli, Emery W. ([id number]) -------------------------------------------------------------------------------- Multi Wound Chart Details Patient Name: Vigilante, Rahmel W. Date of Service: 03/20/2022 8:15 AM Medical Record Number: [id number] Patient Account Number: [id number] Date of Birth/Sex: Apr 14, 1956 (66 y.o. M) Treating RN: Cornell Barman Primary Care Clytee Heinrich: Alysia Penna Other Clinician: Massie Kluver Referring Latrelle Bazar: Alysia Penna Treating Elowen Debruyn/Extender: Skipper Cliche in Treatment: 32 Vital Signs Height(in): 72 Pulse(bpm): 72 Weight(lbs): Blood Pressure(mmHg): 189/79 Body Mass Index(BMI): Temperature(F): 98.2 Respiratory Rate(breaths/min): 18 Photos: [N/A:N/A] Wound Location: Left Calcaneus N/A N/A Wounding Event: Pressure Injury N/A N/A Primary Etiology: Diabetic Wound/Ulcer of the Lower N/A N/A Extremity Comorbid History: Cataracts, Hypertension, Type II N/A N/A Diabetes, Neuropathy Date Acquired: 06/19/2021 N/A N/A Weeks of Treatment: 32 N/A N/A Wound Status: Open N/A N/A Wound Recurrence: No N/A N/A Measurements L x W x D (cm) 3.2x3.1x0.3 N/A N/A Area (cm) : 7.791 N/A N/A Volume (cm) : 2.337 N/A N/A % Reduction in Area: -243.20% N/A N/A % Reduction in Volume: -929.50% N/A N/A Classification: Grade 2 N/A N/A Exudate Amount: Medium N/A N/A Exudate Type: Serosanguineous N/A N/A Exudate Color: red, brown N/A N/A Granulation Amount: Medium (34-66%) N/A N/A Granulation Quality: Red, Hyper-granulation N/A N/A Necrotic Amount: Medium (34-66%) N/A N/A Exposed Structures: Fat Layer (Subcutaneous Tissue): N/A N/A Yes Epithelialization: Small (1-33%) N/A N/A Treatment Notes Electronic Signature(s) Signed: 03/20/2022 5:04:14 PM By: Massie Kluver Entered By: Massie Kluver on 03/20/2022 08:24:36 Adams, Percell W.  ([id number]) -------------------------------------------------------------------------------- [place] Details Patient Name: Homeyer, Tyrelle W. Date of Service: 03/20/2022 8:15 AM Medical Record Number: [id number] Patient Account Number: [id number] Date of Birth/Sex: 1955/07/17 (66 y.o. M) Treating RN: Cornell Barman Primary Care Mahagony Grieb: Alysia Penna Other Clinician: Massie Kluver Referring Juliet Vasbinder: Alysia Penna Treating Oneita Allmon/Extender: Skipper Cliche in Treatment: 32 Active Inactive Wound/Skin Impairment Nursing Diagnoses: Impaired tissue integrity Knowledge deficit related to ulceration/compromised skin integrity Goals: Ulcer/skin breakdown will have a volume reduction of 30% by week 4 Date Initiated: 08/08/2021 Date Inactivated: 12/19/2021 Target Resolution Date: 09/05/2021 Goal Status: Unmet Unmet Reason: comorbities Ulcer/skin breakdown will have a volume  reduction of 50% by week 8 Date Initiated: 08/08/2021 Date Inactivated: 12/19/2021 Target Resolution Date: 10/03/2021 Goal Status: Unmet Unmet Reason: comorbities Ulcer/skin breakdown will have a volume reduction of 80% by week 12 Date Initiated: 08/08/2021 Date Inactivated: 12/19/2021 Target Resolution Date: 10/31/2021 Goal Status: Unmet Unmet Reason: comorbities Ulcer/skin breakdown will heal within 14 weeks Date Initiated: 08/08/2021 Target Resolution Date: 11/14/2021 Goal Status: Active Interventions: Assess patient/caregiver ability to obtain necessary supplies Assess patient/caregiver ability to perform ulcer/skin care regimen upon admission and as needed Assess ulceration(s) every visit Provide education on ulcer and skin care Notes: Electronic Signature(s) Signed: 03/20/2022 1:10:05 PM By: Gretta Cool, BSN, RN, CWS, Kim RN, BSN Signed: 03/20/2022 5:04:14 PM By: Massie Kluver Entered By: Massie Kluver on 03/20/2022 08:24:23 Vasudevan, Carrick W.  ([id number]) -------------------------------------------------------------------------------- Pain Assessment Details Patient Name: Mersman, Marv W. Date of Service: 03/20/2022 8:15 AM Medical Record Number: [id number] Patient Account Number: [id number] Date of Birth/Sex: 1956/06/15 (66 y.o. M) Treating RN: Cornell Barman Primary Care Glen Kesinger: Alysia Penna Other Clinician: Massie Kluver Referring Atia Haupt: Alysia Penna Treating Meagan Ancona/Extender: Skipper Cliche in Treatment: 32 Active Problems Location of Pain Severity and Description of Pain Patient Has Paino No Site Locations Pain Management and Medication Current Pain Management: Electronic Signature(s) Signed: 03/20/2022 1:10:05 PM By: Gretta Cool, BSN, RN, CWS, Kim RN, BSN Signed: 03/20/2022 5:04:14 PM By: Massie Kluver Entered By: Massie Kluver on 03/20/2022 08:16:38 Mccoy, Nnamdi W. ([id number]) -------------------------------------------------------------------------------- Patient/Caregiver Education Details Patient Name: Platter, Zayvien W. Date of Service: 03/20/2022 8:15 AM Medical Record Number: [id number] Patient Account Number: [id number] Date of Birth/Gender: [birth year] (66 y.o. M) Treating RN: Cornell Barman Primary Care Physician: Alysia Penna Other Clinician: Massie Kluver Referring Physician: Alysia Penna Treating Physician/Extender: Skipper Cliche in Treatment: 37 Education Assessment Education Provided To: Patient Education Topics Provided Wound/Skin Impairment: Handouts: Other: continue wound care as directed Methods: Explain/Verbal Responses: State content correctly Electronic Signature(s) Signed: 03/20/2022 5:04:14 PM By: Massie Kluver Entered By: Massie Kluver on 03/20/2022 08:57:53 Riviello, Governor W. ([id number]) -------------------------------------------------------------------------------- Wound Assessment Details Patient Name: Ishee, Lexx W. Date of Service: 03/20/2022 8:15 AM Medical Record Number:  [id number] Patient Account Number: [id number] Date of Birth/Sex: October 02, 1955 (66 y.o. M) Treating RN: Cornell Barman Primary Care Tavaris Eudy: Alysia Penna Other Clinician: Massie Kluver Referring Nielle Duford: Alysia Penna Treating Hiral Lukasiewicz/Extender: Jeri Cos Weeks in Treatment: 32 Wound Status Wound Number: 1 Primary Etiology: Diabetic Wound/Ulcer of the Lower Extremity Wound Location: Left Calcaneus Wound Status: Open Wounding Event: Pressure Injury Comorbid Cataracts, Hypertension, Type II Diabetes, History: Neuropathy Date Acquired: 06/19/2021 Weeks Of Treatment: 32 Clustered Wound: No Photos Wound Measurements Length: (cm) 3.2 Width: (cm) 3.1 Depth: (cm) 0.3 Area: (cm) 7.791 Volume: (cm) 2.337 % Reduction in Area: -243.2% % Reduction in Volume: -929.5% Epithelialization: Small (1-33%) Wound Description Classification: Grade 2 Exudate Amount: Medium Exudate Type: Serosanguineous Exudate Color: red, brown Foul Odor After Cleansing: No Slough/Fibrino Yes Wound Bed Granulation Amount: Medium (34-66%) Exposed Structure Granulation Quality: Red, Hyper-granulation Fat Layer (Subcutaneous Tissue) Exposed: Yes Necrotic Amount: Medium (34-66%) Necrotic Quality: Adherent Slough Treatment Notes Wound #1 (Calcaneus) Wound Laterality: Left Cleanser Normal Saline Discharge Instruction: Wash your hands with soap and water. Remove old dressing, discard into plastic bag and place into trash. Cleanse the wound with Normal Saline prior to applying a clean dressing using gauze sponges, not tissues or cotton balls. Do not scrub or use excessive force. Pat dry using gauze sponges, not tissue or cotton balls. Peri-Wound Care Topical Carraway, Darol W. ([id number]) Primary Dressing Prisma 4.34 (in) Discharge  Instruction: Moisten w/normal saline or sterile water; Cover wound as directed. Do not remove from wound bed. Secondary Dressing ABD Pad 5x9 (in/in) Discharge Instruction: Cover with ABD  pad Drawtex Hydroconductive Wound Dressing,o2x2 (in/in) Discharge Instruction: Apply to wound with drainage. Gauze Discharge Instruction: As directed: dry, moistened with saline or moistened with Dakins Solution (BORDER) Zetuvit Plus SILICONE BORDER Dressing 4x4 (in/in) Discharge Instruction: Please do not put silicone bordered dressings under wraps. Use non-bordered dressing only. Secured With Medipore Tape - 40M Medipore H Soft Cloth Surgical Tape, 2x2 (in/yd) Kerlix Roll Sterile or Non-Sterile 6-ply 4.5x4 (yd/yd) Discharge Instruction: Apply Kerlix as directed Compression Wrap Compression Stockings Add-Ons Electronic Signature(s) Signed: 03/20/2022 1:10:05 PM By: Gretta Cool, BSN, RN, CWS, Kim RN, BSN Signed: 03/20/2022 5:04:14 PM By: Massie Kluver Entered By: Massie Kluver on 03/20/2022 08:23:10 Humiston, Taner W. ([id number]) -------------------------------------------------------------------------------- Vitals Details Patient Name: Brindisi, Zakariye W. Date of Service: 03/20/2022 8:15 AM Medical Record Number: [id number] Patient Account Number: [id number] Date of Birth/Sex: [birth year] (66 y.o. M) Treating RN: Cornell Barman Primary Care Aliyanna Wassmer: Alysia Penna Other Clinician: Massie Kluver Referring Dayshaun Whobrey: Alysia Penna Treating Quanika Solem/Extender: Skipper Cliche in Treatment: 32 Vital Signs Time Taken: 08:15 Temperature (F): 98.2 Height (in): 72 Pulse (bpm): 68 Respiratory Rate (breaths/min): 18 Blood Pressure (mmHg): 189/79 Reference Range: 80 - 120 mg / dl Electronic Signature(s) Signed: 03/20/2022 5:04:14 PM By: Massie Kluver Entered By: Massie Kluver on 03/20/2022 08:16:20

## 2022-03-23 ENCOUNTER — Ambulatory Visit (INDEPENDENT_AMBULATORY_CARE_PROVIDER_SITE_OTHER): Payer: No Typology Code available for payment source | Admitting: Podiatry

## 2022-03-23 DIAGNOSIS — Z794 Long term (current) use of insulin: Secondary | ICD-10-CM

## 2022-03-23 DIAGNOSIS — M86472 Chronic osteomyelitis with draining sinus, left ankle and foot: Secondary | ICD-10-CM

## 2022-03-23 DIAGNOSIS — E1140 Type 2 diabetes mellitus with diabetic neuropathy, unspecified: Secondary | ICD-10-CM | POA: Diagnosis not present

## 2022-03-23 MED ORDER — DOXYCYCLINE HYCLATE 100 MG PO TABS: 100.0000 mg | ORAL_TABLET | Freq: Two times a day (BID) | ORAL | 0 refills | Status: AC

## 2022-03-25 ENCOUNTER — Encounter: Payer: Self-pay | Admitting: Podiatry

## 2022-03-27 ENCOUNTER — Encounter: Payer: No Typology Code available for payment source | Admitting: Physician Assistant

## 2022-03-27 DIAGNOSIS — E11621 Type 2 diabetes mellitus with foot ulcer: Secondary | ICD-10-CM | POA: Diagnosis not present

## 2022-03-27 NOTE — Progress Notes (Addendum)
[name] [name] ([id number]) Visit Report for 03/27/2022 Chief Complaint Document Details Patient Name: [name] [name] W. Date of Service: 03/27/2022 8:15 AM Medical Record Number: [id number] Patient Account Number: [id number] Date of Birth/Sex: 10/29/1955 (66 y.o. M) Treating RN: Carlene Coria Primary Care Provider: Alysia Penna Other Clinician: Massie Kluver Referring Provider: Alysia Penna Treating Provider/Extender: Skipper Cliche in Treatment: 33 Information Obtained from: Patient Chief Complaint Left heel ulcer Electronic Signature(s) Signed: 03/27/2022 8:19:18 AM By: Worthy Keeler PA-C Entered By: Worthy Keeler on 03/27/2022 08:19:17 [name] [name] W. ([id number]) -------------------------------------------------------------------------------- HPI Details Patient Name: [name] [name] W. Date of Service: 03/27/2022 8:15 AM Medical Record Number: [id number] Patient Account Number: [id number] Date of Birth/Sex: 25-Nov-1955 (66 y.o. M) Treating RN: Carlene Coria Primary Care Provider: Alysia Penna Other Clinician: Massie Kluver Referring Provider: Alysia Penna Treating Provider/Extender: Skipper Cliche in Treatment: 33 History of Present Illness HPI Description: 08/08/2021 upon evaluation today patient appears to be doing somewhat poorly in regard to a heel ulcer that began as a result of the patient tells me him trying to get his foot into his shoes that were somewhat tight. This was around 4 January. He was using a shoehorn fairly aggressively and thinks that he may have caused the bruising at that time. Fortunately there does not appear to be any signs of infection at this point. Nonetheless he does have some definite necrotic tissue. This can need to be cleared away. Nonetheless unfortunately [name] we do not have clearance in my opinion therefore to go forward with an aggressive sharp debridement. Patient does have diabetes mellitus type 2 with the most recent  hemoglobin A1c of 5.2 and that was on June 2022. He also has a history of chronic venous insufficiency, peripheral vascular disease, and hypertension. Obviously with regard to the peripheral vascular disease we will continue to send him to vascular to quantify his ABIs and TBI's. 08/22/2021 upon evaluation today patient appears to be doing well with regard to his wound. This is definitely showing some signs of improvement which is great news and overall very pleased with where things stand today. There does not appear to be any evidence of active infection locally nor systemically which is great news and overall I feel like that this is showing signs of improvement. It is definitely much less irritated as compared to last week. I am very pleased in that regard I am good to be able to get a lot of this dry skin off today as well which is great news. 09/05/2021 upon evaluation today patient's heel actually showing signs of some improvement though there is still a bit of eschar noted in the center part of the wound. I do believe this is going require some sharp debridement to clear this away. The Iodosorb just is not quite cutting it. Fortunately there does not appear to be any signs of active infection at this time locally nor systemically. 09/12/2021 upon evaluation today patient appears to be doing well with regard to his wound. He has been tolerating the dressing changes I do believe that clearing off the eschar last week has been to help there is still some necrotic tissue removed today overall he seems to be doing quite well however. 09/20/2021 upon evaluation today patient appears to be doing well with regard to his wound he does have some necrotic tissue noted on the surface of the wound but I do feel like we are getting better here definitely making good progress. I do  not see any signs of infection which is great news and overall I think that we are on the right track. 09-26-2021 upon evaluation  today patient appears to be doing well with regard to his heel ulcer. This is actually measuring smaller and looking better today. Fortunately I do not see any evidence of active infection locally or systemically which is great news. 10-03-2021 upon evaluation today patient appears to be doing well currently in regard to his heel for the most part although he does have a little bit of what appears to be bruising in the base of the wound. I am not sure exactly where to get the pressure from but this very likely is happening when he is walking he tells me has been wearing some other shoes been a little bit more on his feet than normal. Fortunately I do not see any evidence of active infection at this time. I have contemplated put him in a different type of shoe or even a total contact cast to be honest. With that being said his balance is not good and I therefore have some concerns in that regard. 10-17-2021 upon evaluation today patient appears to be doing poorly in regard to his heel. Again this is showing signs of significant deep tissue injury which is bringing into question whether or not he may actually have something going on here which is more related to his blood flow. We have previously done arterial study though to be honest with ABIs right at 1 and noncompressible which tends to make me think this may be falsely elevated coupled with TBI's which were at 0.5 with biphasic waveforms I am not certain he is getting the blood flow needed to completely heal this wound. I discussed with him today that he may need to see vascular in order to discuss options for her further angiogram/evaluation in general. I would at least leave it up to them as to whether they feel the angiogram is necessary or not. Nonetheless I do believe pressure is also playing a component to this he does tell me he started sleeping with his heel on the bed not raised underneath the pillow already this was dark and discolored that  may have been what triggered this to get worse I am not really sure. 5/8; the wound seems to have less of a deep tissue injury but there is still eschar. Scant amount of purulent drainage some erythema medially extending up the heel. He has neuropathy no pain no systemic illness. Culture that we did last time showed moderate Proteus, moderate Enterococcus and moderate Pseudomonas. The Enterococcus will require ampicillin so he will require 2 antibiotics. He is using Santyl to the wound 10-31-2021 upon evaluation today patient appears to be doing well with regard to his wound all things considered. There is still necrotic tissue noted in the central portion of the wound which is not what we want to see but unfortunately I think this is just could be the case until we ensure he has good blood flow and can subsequently get things moving in a better direction. Fortunately I do not see any evidence of infection locally or systemically which is great news and overall I think that we are on the right track in that regard. I am going to go ahead and refill the Augmentin as well as the Cipro for him today he was given originally 10 days worth I would have her extend that for 7 days. 11-07-2021 upon evaluation today patient appears to  be doing better in regard to the overall appearance of his wound and I do feel like the [facility] Needs is doing a good job here. Fortunately there does not appear to be any evidence of active infection locally nor systemically which is great news and overall I am extremely pleased with where things stand today. The patient is happy as well with the progress. Nonetheless I had extended his Augmentin and Cipro for him at the last visit I think that is going to need to be extended a bit further. That means he is probably can need some probiotics as well. 11-21-2021 upon evaluation today patient appears to be doing a little better in regard to size and is definitely making some progress here.  With that being said he also definitely has some areas that are still fairly thick with necrotic tissue. We discussed debridement yet again today and he is Tayler, [name] W. ([id number]) amenable to doing some debridement as [name] as its not too aggressive which I think we can definitely do. I do believe the Ann[facility] Needs is doing a great job and will definitely be continuing that as well. 12-05-2021 upon evaluation today patient appears to be doing well with regard to his heel. He has been tolerating the dressing changes without complication. Fortunately there does not appear to be any evidence of active infection locally or systemically at this time. No fevers, chills, nausea, vomiting, or diarrhea. 12-19-2021 upon evaluation today patient appears to be doing excellent in regard to his wound. Has been tolerating the Santyl in an excellent fashion. Fortunately I do not see any evidence of infection locally or systemically which is great news and overall I am very pleased with where we stand today. 01-02-2022 upon evaluation today patient appears to be doing excellent in regard to his wound. He has been tolerating the dressing changes without complication. Fortunately I do not see any evidence of active infection there is minimal amount of slough and biofilm buildup which I Minna perform debridement in regard to today but other than this overall I really feel like that the patient is doing excellent and I think he is ready to switch to a collagen based dressing. I think this is going to do well for him. 01-19-2022 upon evaluation today patient appears to be doing much better in regard to his wound. He has been tolerating the dressing changes without complication. Fortunately there does not appear to be any evidence of active infection locally or systemically at this time which is great news and overall I am extremely pleased with where we stand. There does not appear to be any evidence of active infection whatsoever  which is great news. No fevers, chills, nausea, vomiting, or diarrhea. 01-30-2022 upon evaluation today patient appears to be doing well currently in regard to his wound. This is actually showing signs of excellent improvement I am very pleased based on what we are seeing today. I do not see any evidence of active infection locally or systemically at this time. 02-13-2022 upon evaluation today patient appears to be doing excellent in regard to his wound. This is showing signs of a little bit of maceration otherwise it seems to be a little bit smaller which is great news. Overall very pleased with where things stand at this point. 02-27-2022 upon evaluation today patient appears to be doing well currently in regard to his wound. He has been tolerating the dressing changes without complication. Fortunately I see no evidence of active infection locally or systemically at  this time which is great news. No fevers, chills, nausea, vomiting, or diarrhea. 03-06-2022 upon evaluation today patient appears to be doing excellent in regard to his wound. In fact this is showing signs of improvement even compared to the last time I saw him. I am very pleased in that regard. Fortunately I do not see any evidence of active infection locally or systemically at this time which is great news. No fevers, chills, nausea, vomiting, or diarrhea. We do have the first graphics available for application today. 03-13-2022 upon evaluation today patient appears to be doing well other than the fact that his wound does appear to be very macerated. I think that we can have to use roll gauze to secure in place and with an ABD pad instead of doing the bordered foam dressing although I know this is easier is also I think can be more problematic from a moisture standpoint. Fortunately I do not see any signs of active infection locally nor systemically and I do believe the wound bed itself looks to be doing much better. 03-20-2022 upon  evaluation today patient appears to be doing well currently in regard to the overall appearance of his wound although unfortunately he is not doing nearly as well with regard to the drainage. He has a lot of drainage which in turn is causing maceration around the edges of the wound attempted to cut the Mepitel differently so that it would drain through the middle better and collect outside and not actually irritate the edges as much last time. With that being said that did not seem to work and this still is larger today based on the irritation around the edges. Everything else looks to be doing well and I am pleased in that regard my hope would been that this would help to get this closed faster but I think that with the amount of drainage that he has that is just not working out the way we want. 03-27-2022 upon evaluation today patient unfortunately has an area of discoloration on the posterior aspect of his heel. He is not doing nearly as well as what we would like to have seen although it is less wet compared to last week it is also again looking like there was pressure occurring on the back of the heel. He did see Dr. Posey Pronto in the meantime and Dr. Posey Pronto is going to take over treatment for the next several months to try to get this closed. He said that we have really done everything that we generally would need to do to try to get this closed he wants to see if there is anything we can do to help there could potentially be looking at a wound VAC. Electronic Signature(s) Signed: 03/27/2022 8:50:38 AM By: Worthy Keeler PA-C Entered By: Worthy Keeler on 03/27/2022 08:50:38 [name] [name] W. ([id number]) -------------------------------------------------------------------------------- Physical Exam Details Patient Name: [name] [name] W. Date of Service: 03/27/2022 8:15 AM Medical Record Number: [id number] Patient Account Number: [id number] Date of Birth/Sex: 01/21/1956 (66 y.o. M) Treating RN: Carlene Coria Primary Care Provider: Alysia Penna Other Clinician: Massie Kluver Referring Provider: Alysia Penna Treating Provider/Extender: Skipper Cliche in Treatment: 46 Constitutional Well-nourished and well-hydrated in no acute distress. Respiratory normal breathing without difficulty. Psychiatric this patient is able to make decisions and demonstrates good insight into disease process. Alert and Oriented x 3. pleasant and cooperative. Notes Upon inspection patient's wound bed actually showed signs of actually doing better from the standpoint of moisture control I  think he did a great job over the last week with that much better than what we had the skin salve on. Unfortunately I do believe that this area of dark discoloration on the posterior aspect of the heel is more pressure related we discussed that today in great detail and how to try to avoid as much as possible. Electronic Signature(s) Signed: 03/27/2022 8:51:26 AM By: Worthy Keeler PA-C Entered By: Worthy Keeler on 03/27/2022 08:51:25 [name] [name] W. ([id number]) -------------------------------------------------------------------------------- Physician Orders Details Patient Name: [name] [name] W. Date of Service: 03/27/2022 8:15 AM Medical Record Number: [id number] Patient Account Number: [id number] Date of Birth/Sex: 1956/02/21 (66 y.o. 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 o Nurse Visit as needed [employer] o Wash wounds with antibacterial soap and water. o May shower; gently cleanse wound with antibacterial soap, rinse and pat dry prior to dressing wounds - shower with dressing on and remove after shower o No tub bath. Anesthetic (Use 'Patient Medications' Section for Anesthetic Order Entry) o Lidocaine applied to wound bed Edema Control - Lymphedema / Segmental Compressive Device / Other o Elevate, Exercise Daily and Avoid Standing for Roddey Periods of Time. o Elevate leg(s) parallel to the floor when sitting. o DO YOUR BEST to sleep in the bed at night. DO NOT sleep in your recliner. Red hours of sitting in a recliner leads to swelling of the legs and/or potential wounds on your backside. Additional Orders / Instructions o Other: - Patient is seeing podiatry-Dr. Boneta Lucks for next several months for treatment Wound Treatment Wound #1 - Calcaneus Wound Laterality: Left Cleanser: Normal Saline 1 x Per Day/30 Days Discharge Instructions: Wash your hands with soap and water. Remove old dressing, discard into plastic bag and place into trash. Cleanse the wound with Normal Saline prior to applying a clean dressing using gauze sponges, not tissues or cotton balls. Do not scrub or use excessive force. Pat dry using gauze sponges, not tissue or cotton balls. Topical: Santyl Collagenase Ointment, 30 (gm), tube 1 x Per Day/30 Days Discharge Instructions: apply nickel thick to wound bed only Secondary Dressing: ABD Pad 5x9 (in/in) (Generic) 1 x Per Day/30 Days Discharge Instructions: Cover with ABD pad Secondary Dressing: Gauze (Generic) 1 x Per Day/30 Days Discharge Instructions: As directed: dry, moistened with saline or moistened with Dakins Solution Secured With: Medipore Tape - 14M Medipore H Soft Cloth Surgical Tape, 2x2 (in/yd) 1 x Per Day/30 Days Secured  With: [employer] Sterile or Non-Sterile 6-ply 4.5x4 (yd/yd) 1 x Per Day/30 Days Discharge Instructions: Apply Kerlix as directed [name] [name] ([id number]) Patient Medications Allergies: No Known Drug Allergies Notifications Medication Indication Start End Santyl 03/27/2022 DOSE topical 250 unit/gram ointment - ointment topical Apply nickel thick daily to the wound bed and then cover with a dressing as directed in clinic x 30 days Electronic Signature(s) Signed: 03/27/2022 8:58:37 AM By: Worthy Keeler PA-C Entered By: Worthy Keeler on 03/27/2022 08:58:36 Olkowski, Keante W. ([id number]) -------------------------------------------------------------------------------- Problem List Details Patient  Name: Kump, Trystian W. Date of Service: 03/27/2022 8:15 AM Medical Record Number: [id number] Patient Account Number: [id number] Date of Birth/Sex: 1956-05-11 (66 y.o. M) Treating RN: Carlene Coria Primary Care Provider: Alysia Penna Other Clinician: Massie Kluver Referring Provider: Alysia Penna Treating Provider/Extender: Skipper Cliche in Treatment: 33 Active Problems ICD-10 Encounter Code Description Active Date MDM Diagnosis E11.621 Type 2 diabetes mellitus with foot ulcer 08/08/2021 No Yes L97.422 Non-pressure chronic ulcer of left heel and midfoot with fat layer 08/08/2021 No Yes exposed [id number] Other chronic osteomyelitis, left ankle and foot 11/07/2021 No Yes I87.2 Venous insufficiency (chronic) (peripheral) 08/08/2021 No Yes I73.89 Other specified peripheral vascular diseases 08/08/2021 No Yes I10 Essential (primary) hypertension 08/08/2021 No Yes Inactive Problems Resolved Problems Electronic Signature(s) Signed: 03/27/2022 8:19:15 AM By: Worthy Keeler PA-C Entered By: Worthy Keeler on 03/27/2022 08:19:15 [name] [name] W. ([id number]) -------------------------------------------------------------------------------- Progress Note Details Patient Name: [name] [name] W. Date of Service:  03/27/2022 8:15 AM Medical Record Number: [id number] Patient Account Number: [id number] Date of Birth/Sex: May 05, 1956 (66 y.o. M) Treating RN: Carlene Coria Primary Care Provider: Alysia Penna Other Clinician: Massie Kluver Referring Provider: Alysia Penna Treating Provider/Extender: Skipper Cliche in Treatment: 33 Subjective Chief Complaint Information obtained from Patient Left heel ulcer History of Present Illness (HPI) 08/08/2021 upon evaluation today patient appears to be doing somewhat poorly in regard to a heel ulcer that began as a result of the patient tells me him trying to get his foot into his shoes that were somewhat tight. This was around 4 January. He was using a shoehorn fairly aggressively and thinks that he may have caused the bruising at that time. Fortunately there does not appear to be any signs of infection at this point. Nonetheless he does have some definite necrotic tissue. This can need to be cleared away. Nonetheless unfortunately [name] we do not have clearance in my opinion therefore to go forward with an aggressive sharp debridement. Patient does have diabetes mellitus type 2 with the most recent hemoglobin A1c of 5.2 and that was on June 2022. He also has a history of chronic venous insufficiency, peripheral vascular disease, and hypertension. Obviously with regard to the peripheral vascular disease we will continue to send him to vascular to quantify his ABIs and TBI's. 08/22/2021 upon evaluation today patient appears to be doing well with regard to his wound. This is definitely showing some signs of improvement which is great news and overall very pleased with where things stand today. There does not appear to be any evidence of active infection locally nor systemically which is great news and overall I feel like that this is showing signs of improvement. It is definitely much less irritated as compared to last week. I am very pleased in  that regard I am good to be able to get a lot of this dry skin off today as well which is great news. 09/05/2021 upon evaluation today patient's heel actually showing signs of some improvement though there is still a bit of eschar noted in the center part of the wound. I do believe this is going require some sharp debridement to clear this away. The Iodosorb just is not quite cutting it. Fortunately there does not appear to be any signs of active infection at this time locally nor systemically. 09/12/2021 upon evaluation today patient appears to be doing well with regard to his wound. He has been tolerating the dressing changes I do believe that clearing off the eschar last week  has been to help there is still some necrotic tissue removed today overall he seems to be doing quite well however. 09/20/2021 upon evaluation today patient appears to be doing well with regard to his wound he does have some necrotic tissue noted on the surface of the wound but I do feel like we are getting better here definitely making good progress. I do not see any signs of infection which is great news and overall I think that we are on the right track. 09-26-2021 upon evaluation today patient appears to be doing well with regard to his heel ulcer. This is actually measuring smaller and looking better today. Fortunately I do not see any evidence of active infection locally or systemically which is great news. 10-03-2021 upon evaluation today patient appears to be doing well currently in regard to his heel for the most part although he does have a little bit of what appears to be bruising in the base of the wound. I am not sure exactly where to get the pressure from but this very likely is happening when he is walking he tells me has been wearing some other shoes been a little bit more on his feet than normal. Fortunately I do not see any evidence of active infection at this time. I have contemplated put him in a different type  of shoe or even a total contact cast to be honest. With that being said his balance is not good and I therefore have some concerns in that regard. 10-17-2021 upon evaluation today patient appears to be doing poorly in regard to his heel. Again this is showing signs of significant deep tissue injury which is bringing into question whether or not he may actually have something going on here which is more related to his blood flow. We have previously done arterial study though to be honest with ABIs right at 1 and noncompressible which tends to make me think this may be falsely elevated coupled with TBI's which were at 0.5 with biphasic waveforms I am not certain he is getting the blood flow needed to completely heal this wound. I discussed with him today that he may need to see vascular in order to discuss options for her further angiogram/evaluation in general. I would at least leave it up to them as to whether they feel the angiogram is necessary or not. Nonetheless I do believe pressure is also playing a component to this he does tell me he started sleeping with his heel on the bed not raised underneath the pillow already this was dark and discolored that may have been what triggered this to get worse I am not really sure. 5/8; the wound seems to have less of a deep tissue injury but there is still eschar. Scant amount of purulent drainage some erythema medially extending up the heel. He has neuropathy no pain no systemic illness. Culture that we did last time showed moderate Proteus, moderate Enterococcus and moderate Pseudomonas. The Enterococcus will require ampicillin so he will require 2 antibiotics. He is using Santyl to the wound 10-31-2021 upon evaluation today patient appears to be doing well with regard to his wound all things considered. There is still necrotic tissue noted in the central portion of the wound which is not what we want to see but unfortunately I think this is just could be the  case until we ensure he has good blood flow and can subsequently get things moving in a better direction. Fortunately I do not see any  evidence of infection locally or systemically which is great news and overall I think that we are on the right track in that regard. I am going to go ahead and refill the Augmentin as well as the Cipro for him today he was given originally 10 days worth I would have her extend that for 7 days. 11-07-2021 upon evaluation today patient appears to be doing better in regard to the overall appearance of his wound and I do feel like the Annitta Needs is doing a good job here. Fortunately there does not appear to be any evidence of active infection locally nor systemically which is great news and overall I am extremely pleased with where things stand today. The patient is happy as well with the progress. Nonetheless I had extended his [name] [name] [name] W. ([id number]) Augmentin and Cipro for him at the last visit I think that is going to need to be extended a bit further. That means he is probably can need some probiotics as well. 11-21-2021 upon evaluation today patient appears to be doing a little better in regard to size and is definitely making some progress here. With that being said he also definitely has some areas that are still fairly thick with necrotic tissue. We discussed debridement yet again today and he is amenable to doing some debridement as Sadler as its not too aggressive which I think we can definitely do. I do believe the Annitta Needs is doing a great job and will definitely be continuing that as well. 12-05-2021 upon evaluation today patient appears to be doing well with regard to his heel. He has been tolerating the dressing changes without complication. Fortunately there does not appear to be any evidence of active infection locally or systemically at this time. No fevers, chills, nausea, vomiting, or diarrhea. 12-19-2021 upon evaluation today patient appears to be doing  excellent in regard to his wound. Has been tolerating the Santyl in an excellent fashion. Fortunately I do not see any evidence of infection locally or systemically which is great news and overall I am very pleased with where we stand today. 01-02-2022 upon evaluation today patient appears to be doing excellent in regard to his wound. He has been tolerating the dressing changes without complication. Fortunately I do not see any evidence of active infection there is minimal amount of slough and biofilm buildup which I Minna perform debridement in regard to today but other than this overall I really feel like that the patient is doing excellent and I think he is ready to switch to a collagen based dressing. I think this is going to do well for him. 01-19-2022 upon evaluation today patient appears to be doing much better in regard to his wound. He has been tolerating the dressing changes without complication. Fortunately there does not appear to be any evidence of active infection locally or systemically at this time which is great news and overall I am extremely pleased with where we stand. There does not appear to be any evidence of active infection whatsoever which is great news. No fevers, chills, nausea, vomiting, or diarrhea. 01-30-2022 upon evaluation today patient appears to be doing well currently in regard to his wound. This is actually showing signs of excellent improvement I am very pleased based on what we are seeing today. I do not see any evidence of active infection locally or systemically at this time. 02-13-2022 upon evaluation today patient appears to be doing excellent in regard to his wound. This is showing signs of a  little bit of maceration otherwise it seems to be a little bit smaller which is great news. Overall very pleased with where things stand at this point. 02-27-2022 upon evaluation today patient appears to be doing well currently in regard to his wound. He has been tolerating  the dressing changes without complication. Fortunately I see no evidence of active infection locally or systemically at this time which is great news. No fevers, chills, nausea, vomiting, or diarrhea. 03-06-2022 upon evaluation today patient appears to be doing excellent in regard to his wound. In fact this is showing signs of improvement even compared to the last time I saw him. I am very pleased in that regard. Fortunately I do not see any evidence of active infection locally or systemically at this time which is great news. No fevers, chills, nausea, vomiting, or diarrhea. We do have the first graphics available for application today. 03-13-2022 upon evaluation today patient appears to be doing well other than the fact that his wound does appear to be very macerated. I think that we can have to use roll gauze to secure in place and with an ABD pad instead of doing the bordered foam dressing although I know this is easier is also I think can be more problematic from a moisture standpoint. Fortunately I do not see any signs of active infection locally nor systemically and I do believe the wound bed itself looks to be doing much better. 03-20-2022 upon evaluation today patient appears to be doing well currently in regard to the overall appearance of his wound although unfortunately he is not doing nearly as well with regard to the drainage. He has a lot of drainage which in turn is causing maceration around the edges of the wound attempted to cut the Mepitel differently so that it would drain through the middle better and collect outside and not actually irritate the edges as much last time. With that being said that did not seem to work and this still is larger today based on the irritation around the edges. Everything else looks to be doing well and I am pleased in that regard my hope would been that this would help to get this closed faster but I think that with the amount of drainage that he has  that is just not working out the way we want. 03-27-2022 upon evaluation today patient unfortunately has an area of discoloration on the posterior aspect of his heel. He is not doing nearly as well as what we would like to have seen although it is less wet compared to last week it is also again looking like there was pressure occurring on the back of the heel. He did see Dr. Posey Pronto in the meantime and Dr. Posey Pronto is going to take over treatment for the next several months to try to get this closed. He said that we have really done everything that we generally would need to do to try to get this closed he wants to see if there is anything we can do to help there could potentially be looking at a wound VAC. Objective Constitutional Well-nourished and well-hydrated in no acute distress. Vitals Time Taken: 8:10 AM, Height: 72 in, Temperature: 97.8 F, Pulse: 80 bpm, Respiratory Rate: 18 breaths/min, Blood Pressure: 195/84 mmHg. General Notes: Patient has no symptoms, provider aware Respiratory Cerino, [name] [name] W. ([id number]) normal breathing without difficulty. Psychiatric this patient is able to make decisions and demonstrates good insight into disease process. Alert and Oriented x 3. pleasant and  cooperative. General Notes: Upon inspection patient's wound bed actually showed signs of actually doing better from the standpoint of moisture control I think he did a great job over the last week with that much better than what we had the skin salve on. Unfortunately I do believe that this area of dark discoloration on the posterior aspect of the heel is more pressure related we discussed that today in great detail and how to try to avoid as much as possible. Integumentary (Hair, Skin) Wound #1 status is Open. Original cause of wound was Pressure Injury. The date acquired was: 06/19/2021. The wound has been in treatment 33 weeks. The wound is located on the Left Calcaneus. The wound measures 3.9cm length x 3.2cm  width x 0.2cm depth; 9.802cm^2 area and 1.96cm^3 volume. There is Fat Layer (Subcutaneous Tissue) exposed. There is no tunneling or undermining noted. There is a medium amount of serosanguineous drainage noted. There is small (1-33%) red granulation within the wound bed. There is a medium (34-66%) amount of necrotic tissue within the wound bed including Eschar and Adherent Slough. 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 Nurse Visit as needed Bathing/ Shower/ Hygiene: Wash wounds with antibacterial soap and water. May shower; gently cleanse wound with antibacterial soap, rinse and pat dry prior to dressing wounds - shower with dressing on and remove after shower No tub bath. Anesthetic (Use 'Patient Medications' Section for Anesthetic Order Entry): Lidocaine applied to wound bed Edema Control - Lymphedema / Segmental Compressive Device / Other: Elevate, Exercise Daily and Avoid Standing for Kerth Periods of Time. Elevate leg(s) parallel to the floor when sitting. DO YOUR BEST to sleep in the bed at night. DO NOT sleep in your recliner. [place] hours of sitting in a recliner leads to swelling of the legs and/or potential wounds on your backside. Additional Orders / Instructions: Other: - Patient is seeing podiatry-Dr. Boneta Lucks for next several months for treatment The following medication(s) was prescribed: Santyl topical 250 unit/gram ointment ointment topical Apply nickel thick daily to the wound bed and then cover with a dressing as directed in clinic x 30 days starting 03/27/2022 WOUND #1: - Calcaneus Wound Laterality: Left Cleanser: Normal Saline  1 x Per Day/30 Days Discharge Instructions: Wash your hands with soap and water. Remove old dressing, discard into plastic bag and place into trash. Cleanse the wound with Normal Saline prior to applying a clean dressing using gauze sponges, not tissues or cotton balls. Do not scrub or use excessive force. Pat dry using gauze sponges, not tissue or cotton balls. Topical: Santyl Collagenase Ointment, 30 (gm), tube 1 x Per Day/30 Days Discharge Instructions: apply nickel thick to wound bed only Secondary Dressing: ABD Pad 5x9 (in/in) (Generic) 1 x Per Day/30 Days Discharge Instructions: Cover with ABD pad Secondary Dressing: Gauze (Generic) 1 x Per Day/30 Days Discharge Instructions: As directed: dry, moistened with saline or moistened with Dakins Solution Secured With: Medipore Tape - 49M Medipore H Soft Cloth Surgical Tape, 2x2 (in/yd) 1 x Per Day/30 Days Secured With: Kerlix Roll Sterile or Non-Sterile 6-ply 4.5x4 (yd/yd) 1 x Per Day/30 Days Discharge Instructions: Apply Kerlix as directed Hemler, Arth W. ([id number]) 1. Based on what I am seeing I do believe that the patient  would benefit from the use of Santyl. He has done well with this in the past I Georgina Peer go ahead and get that sent into the pharmacy for him he should be changing this daily. 2. Also discussed in great detail appropriate offloading with him. He voiced understanding and he does have an offloading shoe at home he does not love it but he states that he can definitely go back to attempting to use that. 3. I am also can recommend that we have the patient follow-up with Dr. Posey Pronto per their plan he has ordered a wound VAC that is the direction that they want to take which I definitely think he has an okay way to go for that reason they are to be taken over care for a period of time going forward and we will subsequently to turn over care to him currently. We will see the patient back just for follow-up visit as needed at this point  since Dr. Posey Pronto is taking over care. Electronic Signature(s) Signed: 03/27/2022 8:59:34 AM By: Worthy Keeler PA-C Previous Signature: 03/27/2022 8:53:02 AM Version By: Worthy Keeler PA-C Entered By: Worthy Keeler on 03/27/2022 08:59:33 Radich, Amedio W. ([id number]) -------------------------------------------------------------------------------- SuperBill Details Patient Name: Anglemyer, Kaynen W. Date of Service: 03/27/2022 Medical Record Number: [id number] Patient Account Number: [id number] Date of Birth/Sex: Nov 15, 1955 (66 y.o. M) Treating RN: Carlene Coria Primary Care Provider: Alysia Penna Other Clinician: Massie Kluver Referring Provider: Alysia Penna Treating Provider/Extender: Skipper Cliche in Treatment: 33 Diagnosis Coding ICD-10 Codes Code Description E11.621 Type 2 diabetes mellitus with foot ulcer L97.422 Non-pressure chronic ulcer of left heel and midfoot with fat layer exposed M86.672 Other chronic osteomyelitis, left ankle and foot I87.2 Venous insufficiency (chronic) (peripheral) I73.89 Other specified peripheral vascular diseases I10 Essential (primary) hypertension Facility Procedures CPT4 Code: [id number] Description: 99213 - WOUND CARE VISIT-LEV 3 EST PT Modifier: Quantity: 1 Physician Procedures CPT4 Code: [id number] Description: [id number] - WC PHYS LEVEL 4 - EST PT Modifier: Quantity: 1 CPT4 Code: Description: ICD-10 Diagnosis Description E11.621 Type 2 diabetes mellitus with foot ulcer L97.422 Non-pressure chronic ulcer of left heel and midfoot with fat layer ex [id number] Other chronic osteomyelitis, left ankle and foot I87.2 Venous insufficiency  (chronic) (peripheral) Modifier: posed Quantity: Electronic Signature(s) Signed: 03/27/2022 8:53:15 AM By: Worthy Keeler PA-C Entered By: Worthy Keeler on 03/27/2022 08:53:15

## 2022-03-28 NOTE — Progress Notes (Unsigned)
Subjective:  Patient ID: [name], male    DOB: 12-Aug-1955,  MRN: [id number]  Chief Complaint  Patient presents with   Foot Ulcer    66 y.o. male presents for wound care.  Patient presents with left heel wound that has been going for quite some time is progressive gotten worse.  The wound was present for 7 months.  Tried several different treatment.  He had an MRI done as wellWhich shows that there may be a concern for osteomyelitis.  He also would like to discuss other treatment options.  He is not taking any antibiotics.  He was referred to me by the wound care center.   Review of Systems: Negative except as noted in the HPI. Denies N/V/F/Ch.  Past Medical History:  Diagnosis Date   Diabetes mellitus    Hematospermia    Hypertension    Nephrolithiasis 02/07/07   Viral meningitis     Current Outpatient Medications:    doxycycline (VIBRA-TABS) 100 MG tablet, Take 1 tablet (100 mg total) by mouth 2 (two) times daily for 14 days., Disp: 28 tablet, Rfl: 0   ACCU-CHEK GUIDE test strip, Use to test blood sugars twice daily., Disp: 200 strip, Rfl: 3   Accu-Chek Softclix Lancets lancets, daily., Disp: , Rfl:    ALPRAZolam (XANAX) 0.5 MG tablet, Take 1 tablet (0.5 mg total) by mouth 3 (three) times daily as needed for anxiety or sleep., Disp: 270 tablet, Rfl: 1   Blood Glucose Monitoring Suppl (ACCU-CHEK GUIDE ME) w/Device KIT, USE AS DIRECTED EVERY DAY, Disp: , Rfl:    furosemide (LASIX) 20 MG tablet, Take 1 tablet (20 mg total) by mouth daily as needed for fluid., Disp: 90 tablet, Rfl: 3   insulin isophane & regular human KwikPen (NOVOLIN 70/30 KWIKPEN) (70-30) 100 UNIT/ML KwikPen, Inject 25 Units into the skin 2 (two) times daily with a meal., Disp: 300 mL, Rfl: 3   Insulin Pen Needle 29G X 12.7MM MISC, Use as directed to inject novilin, Disp: 200 each, Rfl: 3   levothyroxine (SYNTHROID) 50 MCG tablet, TAKE 1 TABLET BY MOUTH EVERY DAY, Disp: 90 tablet, Rfl: 1   metoprolol succinate  (TOPROL-XL) 25 MG 24 hr tablet, Take 1 tablet (25 mg total) by mouth daily., Disp: 90 tablet, Rfl: 3   Multiple Vitamin (MULTIVITAMIN) tablet, Take 1 tablet by mouth daily., Disp: , Rfl:    temazepam (RESTORIL) 30 MG capsule, TAKE 1 CAPSULE BY MOUTH AT BEDTIME AS NEEDED FOR SLEEP, Disp: 90 capsule, Rfl: 1  Social History   Tobacco Use  Smoking Status Former  Smokeless Tobacco Never  Tobacco Comments   22 yrs ago     No Known Allergies Objective:  There were no vitals filed for this visit. There is no height or weight on file to calculate BMI. Constitutional Well developed. Well nourished.  Vascular Dorsalis pedis pulses palpable bilaterally. Posterior tibial pulses palpable bilaterally. Capillary refill normal to all digits.  No cyanosis or clubbing noted. Pedal hair growth normal.  Neurologic Normal speech. Oriented to person, place, and time. Protective sensation absent  Dermatologic Wound Location: Left heel wound fat layer exposed Wound Base: Regular Peri-wound: Calloused Exudate: Scant/small amount Serosanguinous exudate Wound Measurements: -See below  Orthopedic: No pain to palpation either foot.   Radiographs: None Assessment:  No diagnosis found. Plan:  Patient was evaluated and treated and all questions answered.  Ulcer left heel ulceration with fat layer exposed -Debridement as below. -Dressed with Betadine wet-to-dry, DSD. -Continue off-loading with  surgical shoe.  Procedure: Excisional Debridement of Wound Tool: Sharp chisel blade/tissue nipper Rationale: Removal of non-viable soft tissue from the wound to promote healing.  Anesthesia: none Pre-Debridement Wound Measurements: *** cm x *** cm x *** cm  Post-Debridement Wound Measurements: *** cm x *** cm x *** cm  Type of Debridement: Sharp Excisional Tissue Removed: Non-viable soft tissue Blood loss: Minimal (<50cc) Depth of Debridement: subcutaneous tissue. Technique: Sharp excisional debridement  to bleeding, viable wound base.  Wound Progress: *** Site healing conversation 7 Dressing: Dry, sterile, compression dressing. Disposition: Patient tolerated procedure well. Patient to return in 1 week for follow-up.  No follow-ups on file.

## 2022-03-31 NOTE — Progress Notes (Addendum)
Hestand, [name] ([id number]) [id number] Page 1 of 8 Visit Report for 03/27/2022 Arrival Information Details Patient Name: Date of Service: [name], [place]. 03/27/2022 8:15 A M Medical Record Number: [id number] Patient Account Number: [id number] Date of Birth/Sex: Treating RN: [birth year] (66 y.o. [name]) Carlene Coria Primary Care [name]: Alysia Penna Other Clinician: Massie Kluver Referring Casady Voshell: Treating [name]/Extender: Angela Burke in Treatment: 33 Visit Information History Since Last Visit All ordered tests and consults were completed: No Patient Arrived: [name] Added or deleted any medications: No Arrival Time: 08:05 Any new allergies or adverse reactions: No Transfer Assistance: None Had a fall or experienced change in No Patient Requires Transmission-Based Precautions: No activities of daily living that may affect Patient Has Alerts: Yes risk of falls: Patient Alerts: ABI R >1.0 Sanders TBI 0.50 Hospitalized since last visit: No ABI L 0.98 Pain Present Now: No Electronic Signature(s) Signed: 03/27/2022 4:48:52 PM By: Massie Kluver Entered By: Massie Kluver on 03/27/2022 08:10:22 -------------------------------------------------------------------------------- Clinic Level of Care Assessment Details Patient Name: Date of Service: [name], [name]. 03/27/2022 8:15 A M Medical Record Number: [id number] Patient Account Number: [id number] Date of Birth/Sex: Treating RN: 1956-06-02 (66 y.o. [name]) Carlene Coria Primary Care Lan Mcneill: Alysia Penna Other Clinician: Massie Kluver Referring Thedford Bunton: Treating [name]/Extender: Angela Burke in Treatment: 33 Clinic Level of Care Assessment Items TOOL 4 Quantity Score []  - 0 Use when only an EandM is performed on FOLLOW-UP visit ASSESSMENTS - Nursing Assessment / Reassessment X- 1 10 Reassessment of Co-morbidities (includes updates in patient status) X- 1 5 Reassessment of  Adherence to Treatment Plan ASSESSMENTS - Wound and Skin A ssessment / Reassessment X - Simple Wound Assessment / Reassessment - one wound 1 5 []  - 0 Complex Wound Assessment / Reassessment - multiple wounds [name] ([id number]) [id number] Page 2 of 8 []  - 0 Dermatologic / Skin Assessment (not related to wound area) ASSESSMENTS - Focused Assessment []  - 0 Circumferential Edema Measurements - multi extremities []  - 0 Nutritional Assessment / Counseling / Intervention []  - 0 Lower Extremity Assessment (monofilament, tuning fork, pulses) []  - 0 Peripheral Arterial Disease Assessment (using hand held doppler) ASSESSMENTS - Ostomy and/or Continence Assessment and Care []  - 0 Incontinence Assessment and Management []  - 0 Ostomy Care Assessment and Management (repouching, etc.) PROCESS - Coordination of Care X - Simple Patient / Family Education for ongoing care 1 15 []  - 0 Complex (extensive) Patient / Family Education for ongoing care []  - 0 Staff obtains Programmer, systems, Records, T Results / Process Orders est []  - 0 Staff telephones HHA, Nursing Homes / Clarify orders / etc []  - 0 Routine Transfer to another Facility (non-emergent condition) []  - 0 Routine Hospital Admission (non-emergent condition) []  - 0 New Admissions / Biomedical engineer / Ordering NPWT Apligraf, etc. , []  - 0 Emergency Hospital Admission (emergent condition) X- 1 10 Simple Discharge Coordination []  - 0 Complex (extensive) Discharge Coordination PROCESS - Special Needs []  - 0 Pediatric / Minor Patient Management []  - 0 Isolation Patient Management []  - 0 Hearing / Language / Visual special needs []  - 0 Assessment of Community assistance (transportation, D/C planning, etc.) []  - 0 Additional assistance / Altered mentation []  - 0 Support Surface(s) Assessment (bed, cushion, seat, etc.) INTERVENTIONS - Wound Cleansing / Measurement X - Simple Wound Cleansing - one  wound 1 5 []  - 0 Complex Wound Cleansing - multiple wounds X- 1 5 Wound Imaging (photographs -  any number of wounds) []  - 0 Wound Tracing (instead of photographs) X- 1 5 Simple Wound Measurement - one wound []  - 0 Complex Wound Measurement - multiple wounds INTERVENTIONS - Wound Dressings []  - 0 Small Wound Dressing one or multiple wounds X- 1 15 Medium Wound Dressing one or multiple wounds []  - 0 Large Wound Dressing one or multiple wounds X- 1 5 Application of Medications - topical []  - 0 Application of Medications - injection INTERVENTIONS - Miscellaneous []  - 0 External ear exam []  - 0 Specimen Collection (cultures, biopsies, blood, body fluids, etc.) []  - 0 Specimen(s) / Culture(s) sent or taken to Lab for analysis [name], [name] ([id number]) [id number] Page 3 of 8 []  - 0 Patient Transfer (multiple staff / Civil Service fast streamer / Similar devices) []  - 0 Simple Staple / Suture removal (25 or less) []  - 0 Complex Staple / Suture removal (26 or more) []  - 0 Hypo / Hyperglycemic Management (close monitor of Blood Glucose) []  - 0 Ankle / Brachial Index (ABI) - do not check if billed separately X- 1 5 Vital Signs Has the patient been seen at the hospital within the last three years: Yes Total Score: 85 Level Of Care: New/Established - Level 3 Electronic Signature(s) Signed: 03/27/2022 4:48:52 PM By: Massie Kluver Entered By: Massie Kluver on 03/27/2022 08:41:32 -------------------------------------------------------------------------------- Encounter Discharge Information Details Patient Name: Date of Service: [name], [name]. 03/27/2022 8:15 A M Medical Record Number: [id number] Patient Account Number: [id number] Date of Birth/Sex: Treating RN: 05/02/1956 (66 y.o. [name] Primary Care Marquel Pottenger: Alysia Penna Other Clinician: Massie Kluver Referring Trinette [name]: Treating Angelle Isais/Extender: Angela Burke in Treatment:  33 Encounter Discharge Information Items Discharge Condition: Stable Ambulatory Status: Cane Discharge Destination: Home Transportation: Private Auto Accompanied By: self Schedule Follow-up Appointment: Yes Clinical Summary of Care: Electronic Signature(s) Signed: 03/27/2022 4:48:52 PM By: Massie Kluver Entered By: Massie Kluver on 03/27/2022 08:51:17 -------------------------------------------------------------------------------- Lower Extremity Assessment Details Patient Name: Date of Service: [name], [name]. 03/27/2022 8:15 A M Medical Record Number: [id number] Patient Account Number: [id number] Date of Birth/Sex: Treating RN: [birth year] (66 y.o. [name] Primary Care Dajour Pierpoint: Alysia Penna Other Clinician: Massie Kluver Referring Drayk Humbarger: Treating Kenyata Guess/Extender: Arash, Karst[place], Cylan [name] ([id number]) [id number] Page 4 of 8 Weeks in Treatment: 33 Edema Assessment Assessed: [Left: Yes] [Right: No] Edema: [Left: Ye] [Right: s] Calf Left: Right: Point of Measurement: 30 cm From Medial Instep 36.4 cm Ankle Left: Right: Point of Measurement: 10 cm From Medial Instep 24.5 cm Vascular Assessment Pulses: Dorsalis Pedis Palpable: [Left:Yes] Posterior Tibial Palpable: [Left:Yes] Electronic Signature(s) Signed: 03/27/2022 4:48:52 PM By: Massie Kluver Signed: 03/31/2022 12:25:01 PM By: Carlene Coria RN Entered By: Massie Kluver on 03/27/2022 08:26:45 -------------------------------------------------------------------------------- Multi Wound Chart Details Patient Name: Date of Service: [name], [name]. 03/27/2022 8:15 A M Medical Record Number: [id number] Patient Account Number: [id number] Date of Birth/Sex: Treating RN: May 11, 1956 (66 y.o. [name] Primary Care Mindee Robledo: Alysia Penna Other Clinician: Massie Kluver Referring Ethyn Schetter: Treating Annmargaret Decaprio/Extender: Angela Burke in Treatment:  33 Vital Signs Height(in): 72 Pulse(bpm): 80 Weight(lbs): Blood Pressure(mmHg): 195/84 Body Mass Index(BMI): Temperature(F): 97.8 Respiratory Rate(breaths/min): 18 [1:Photos:] [N/A:N/A] Left Calcaneus N/A N/A Wound Location: Pressure Injury N/A N/A Wounding Event: Diabetic Wound/Ulcer of the Lower N/A N/A Primary Etiology: Extremity [place], Ranger [name] ([id number]) [id number] Page 5 of 8 Cataracts, Hypertension, Type II N/A N/A Comorbid History: Diabetes, Neuropathy 06/19/2021 N/A N/A  Date Acquired: 36 N/A N/A Weeks of Treatment: Open N/A N/A Wound Status: No N/A N/A Wound Recurrence: 3.9x3.2x0.2 N/A N/A Measurements L x [name] x D (cm) 9.802 N/A N/A A (cm) : rea 1.96 N/A N/A Volume (cm) : -331.80% N/A N/A % Reduction in A rea: -763.40% N/A N/A % Reduction in Volume: Grade 2 N/A N/A Classification: Medium N/A N/A Exudate A mount: Serosanguineous N/A N/A Exudate Type: red, brown N/A N/A Exudate Color: Small (1-33%) N/A N/A Granulation A mount: Red N/A N/A Granulation Quality: Medium (34-66%) N/A N/A Necrotic A mount: Eschar, Adherent Slough N/A N/A Necrotic Tissue: Fat Layer (Subcutaneous Tissue): Yes N/A N/A Exposed Structures: None N/A N/A Epithelialization: Treatment Notes Electronic Signature(s) Signed: 03/27/2022 4:48:52 PM By: Massie Kluver Entered By: Massie Kluver on 03/27/2022 08:26:55 -------------------------------------------------------------------------------- Multi-Disciplinary Care Plan Details Patient Name: Date of Service: [name], [name]. 03/27/2022 8:15 A M Medical Record Number: [id number] Patient Account Number: [id number] Date of Birth/Sex: Treating RN: 08/12/1955 (66 y.o. [name] Primary Care Jmichael Gille: Alysia Penna Other Clinician: Massie Kluver Referring Niquita Digioia: Treating Esa Raden/Extender: Angela Burke in Treatment: 33 Active Inactive Electronic Signature(s) Signed:  04/20/2022 6:48:31 PM By: Gretta Cool, BSN, RN, CWS, Kim RN, BSN Signed: 05/18/2022 4:42:13 PM By: Carlene Coria RN Previous Signature: 03/27/2022 4:48:52 PM Version By: Massie Kluver Previous Signature: 03/31/2022 12:25:01 PM Version By: Carlene Coria RN Entered By: Gretta Cool, BSN, RN, CWS, Kim on 04/20/2022 18:48:30 [name], [name] ([id number]) [id number] Page 6 of 8 -------------------------------------------------------------------------------- Pain Assessment Details Patient Name: Date of Service: [name]. 03/27/2022 8:15 A M Medical Record Number: [id number] Patient Account Number: [id number] Date of Birth/Sex: Treating RN: 19-Jan-1956 (66 y.o. [name]) Carlene Coria Primary Care Lameka Disla: Alysia Penna Other Clinician: Massie Kluver Referring Yasenia Reedy: Treating Kaiyah Eber/Extender: Angela Burke in Treatment: 33 Active Problems Location of Pain Severity and Description of Pain Patient Has Paino No Site Locations Pain Management and Medication Current Pain Management: Electronic Signature(s) Signed: 03/27/2022 4:48:52 PM By: Massie Kluver Signed: 03/31/2022 12:25:01 PM By: Carlene Coria RN Entered By: Massie Kluver on 03/27/2022 08:10:47 -------------------------------------------------------------------------------- Patient/Caregiver Education Details Patient Name: Date of Service: [name]. 10/9/2023andnbsp8:15 [place] Record Number: [id number] Patient Account Number: [id number] Date of Birth/Gender: Treating RN: [birth year] (66 y.o. [name] Primary Care Physician: Alysia Penna Other Clinician: Massie Kluver Referring Physician: Treating Physician/Extender: Angela Burke in Treatment: 33 Education Assessment Education Provided To: Patient Education Topics Provided Wound/Skin Impairment: Handouts: Other: continue wound care as directed Methods: Explain/Verbal Micciche, Leonie Green ([id number])  [id number] Page 7 of 8 Responses: State content correctly Electronic Signature(s) Signed: 03/27/2022 4:48:52 PM By: Massie Kluver Entered By: Massie Kluver on 03/27/2022 08:50:39 -------------------------------------------------------------------------------- Wound Assessment Details Patient Name: Date of Service: [name], [name]. 03/27/2022 8:15 A M Medical Record Number: [id number] Patient Account Number: [id number] Date of Birth/Sex: Treating RN: Nov 15, 1955 (66 y.o. [name]) Carlene Coria Primary Care Marshawn Normoyle: Alysia Penna Other Clinician: Massie Kluver Referring Lenita Peregrina: Treating Katya Rolston/Extender: Angela Burke in Treatment: 33 Wound Status Wound Number: 1 Primary Etiology: Diabetic Wound/Ulcer of the Lower Extremity Wound Location: Left Calcaneus Wound Status: Open Wounding Event: Pressure Injury Comorbid History: Cataracts, Hypertension, Type II Diabetes, Neuropathy Date Acquired: 06/19/2021 Weeks Of Treatment: 33 Clustered Wound: No Photos Wound Measurements Length: (cm) 3.9 Width: (cm) 3.2 Depth: (cm) 0.2 Area: (cm) 9.802 Volume: (cm) 1.96 % Reduction in Area: -331.8% % Reduction in Volume: -  763.4% Epithelialization: None Tunneling: No Undermining: No Wound Description Classification: Grade 2 Exudate Amount: Medium Exudate Type: Serosanguineous Exudate Color: red, brown Foul Odor After Cleansing: No Slough/Fibrino Yes Wound Bed Granulation Amount: Small (1-33%) Exposed Structure Granulation Quality: Red Fat Layer (Subcutaneous Tissue) Exposed: Yes Necrotic Amount: Medium (34-66%) Necrotic Quality: Eschar, Adherent [employer]) [name], [name] ([id number]) [id number] Page 8 of 8 Signed: 03/27/2022 4:48:52 PM By: Massie Kluver Signed: 03/31/2022 12:25:01 PM By: Carlene Coria RN Entered By: Massie Kluver on 03/27/2022  08:25:09 -------------------------------------------------------------------------------- Vitals Details Patient Name: Date of Service: [name], [name]. 03/27/2022 8:15 A M Medical Record Number: [id number] Patient Account Number: [id number] Date of Birth/Sex: Treating RN: 19-Jun-1956 (66 y.o. [name]) Carlene Coria Primary Care Shanzay Hepworth: Alysia Penna Other Clinician: Massie Kluver Referring Sharolyn Weber: Treating Kabria Hetzer/Extender: Angela Burke in Treatment: 33 Vital Signs Time Taken: 08:10 Temperature (F): 97.8 Height (in): 72 Pulse (bpm): 80 Respiratory Rate (breaths/min): 18 Blood Pressure (mmHg): 195/84 Reference Range: 80 - 120 mg / dl Notes Patient has no symptoms, Antwaun Buth aware Electronic Signature(s) Signed: 03/27/2022 4:48:52 PM By: Massie Kluver Entered By: Massie Kluver on 03/27/2022 08:16:03

## 2022-04-03 ENCOUNTER — Ambulatory Visit: Payer: No Typology Code available for payment source | Admitting: Physician Assistant

## 2022-04-03 ENCOUNTER — Ambulatory Visit
Admission: RE | Admit: 2022-04-03 | Discharge: 2022-04-03 | Disposition: A | Discharge status: Home / Self Care | Payer: No Typology Code available for payment source | Source: Ambulatory Visit | Attending: Podiatry | Admitting: Podiatry

## 2022-04-03 DIAGNOSIS — M86472 Chronic osteomyelitis with draining sinus, left ankle and foot: Secondary | ICD-10-CM | POA: Insufficient documentation

## 2022-04-04 ENCOUNTER — Ambulatory Visit (INDEPENDENT_AMBULATORY_CARE_PROVIDER_SITE_OTHER): Payer: No Typology Code available for payment source | Admitting: Podiatry

## 2022-04-04 DIAGNOSIS — M86472 Chronic osteomyelitis with draining sinus, left ankle and foot: Secondary | ICD-10-CM

## 2022-04-04 DIAGNOSIS — Z794 Long term (current) use of insulin: Secondary | ICD-10-CM

## 2022-04-04 DIAGNOSIS — E1140 Type 2 diabetes mellitus with diabetic neuropathy, unspecified: Secondary | ICD-10-CM | POA: Diagnosis not present

## 2022-04-05 ENCOUNTER — Telehealth: Payer: Self-pay | Admitting: *Deleted

## 2022-04-05 NOTE — Telephone Encounter (Signed)
Patient is calling to because the wound tube on heel is bleeding inside,is this normal?please advise.

## 2022-04-05 NOTE — Telephone Encounter (Signed)
Spoke with patient giving information per physician,verbalized understanding and said the the suction is working fine.

## 2022-04-06 ENCOUNTER — Ambulatory Visit: Payer: No Typology Code available for payment source | Admitting: Podiatry

## 2022-04-07 ENCOUNTER — Encounter: Payer: Self-pay | Admitting: Podiatry

## 2022-04-10 ENCOUNTER — Encounter: Payer: Self-pay | Admitting: Podiatry

## 2022-04-10 ENCOUNTER — Ambulatory Visit: Payer: No Typology Code available for payment source | Admitting: Physician Assistant

## 2022-04-11 ENCOUNTER — Encounter: Payer: Self-pay | Admitting: Podiatry

## 2022-04-11 NOTE — Progress Notes (Signed)
Subjective:  Patient ID: [name], male    DOB: 08/28/1955,  MRN: [id number]  Chief Complaint  Patient presents with   Wound Check    66 y.o. male presents for wound care.  Patient presents with complaint of left heel wound.  Patient states that he is here for application of wound VAC.  He denies any other acute complaints   Review of Systems: Negative except as noted in the HPI. Denies N/V/F/Ch.  Past Medical History:  Diagnosis Date   Diabetes mellitus    Hematospermia    Hypertension    Nephrolithiasis 02/07/07   Viral meningitis     Current Outpatient Medications:    ACCU-CHEK GUIDE test strip, Use to test blood sugars twice daily., Disp: 200 strip, Rfl: 3   Accu-Chek Softclix Lancets lancets, daily., Disp: , Rfl:    ALPRAZolam (XANAX) 0.5 MG tablet, Take 1 tablet (0.5 mg total) by mouth 3 (three) times daily as needed for anxiety or sleep., Disp: 270 tablet, Rfl: 1   Blood Glucose Monitoring Suppl (ACCU-CHEK GUIDE ME) w/Device KIT, USE AS DIRECTED EVERY DAY, Disp: , Rfl:    furosemide (LASIX) 20 MG tablet, Take 1 tablet (20 mg total) by mouth daily as needed for fluid., Disp: 90 tablet, Rfl: 3   insulin isophane & regular human KwikPen (NOVOLIN 70/30 KWIKPEN) (70-30) 100 UNIT/ML KwikPen, Inject 25 Units into the skin 2 (two) times daily with a meal., Disp: 300 mL, Rfl: 3   Insulin Pen Needle 29G X 12.7MM MISC, Use as directed to inject novilin, Disp: 200 each, Rfl: 3   levothyroxine (SYNTHROID) 50 MCG tablet, TAKE 1 TABLET BY MOUTH EVERY DAY, Disp: 90 tablet, Rfl: 1   metoprolol succinate (TOPROL-XL) 25 MG 24 hr tablet, Take 1 tablet (25 mg total) by mouth daily., Disp: 90 tablet, Rfl: 3   Multiple Vitamin (MULTIVITAMIN) tablet, Take 1 tablet by mouth daily., Disp: , Rfl:    temazepam (RESTORIL) 30 MG capsule, TAKE 1 CAPSULE BY MOUTH AT BEDTIME AS NEEDED FOR SLEEP, Disp: 90 capsule, Rfl: 1  Social History   Tobacco Use  Smoking Status Former  Smokeless Tobacco Never   Tobacco Comments   22 yrs ago     No Known Allergies Objective:  There were no vitals filed for this visit. There is no height or weight on file to calculate BMI. Constitutional Well developed. Well nourished.  Vascular Dorsalis pedis pulses palpable bilaterally. Posterior tibial pulses palpable bilaterally. Capillary refill normal to all digits.  No cyanosis or clubbing noted. Pedal hair growth normal.  Neurologic Normal speech. Oriented to person, place, and time. Protective sensation absent  Dermatologic Wound Location: Left heel wound fat layer exposed Wound Base: Regular Peri-wound: Calloused Exudate: Scant/small amount Serosanguinous exudate Wound Measurements: -See below  Orthopedic: No pain to palpation either foot.   Radiographs: None Assessment:   No diagnosis found.  Plan:  Patient was evaluated and treated and all questions answered.  Ulcer left heel ulceration with fat layer exposed -Debridement as below. -Dressed with Betadine wet-to-dry, DSD. -Continue off-loading with surgical shoe. -We will VAC was applied.  It was applied in standard technique.  The wound measurements are listed below  Procedure: Excisional Debridement of Wound Tool: Sharp chisel blade/tissue nipper Rationale: Removal of non-viable soft tissue from the wound to promote healing.  Anesthesia: none Pre-Debridement Wound Measurements: 3 cm x 1.5 cm x 0.3 cm  Post-Debridement Wound Measurements: 3.1 x 1.6 x 0.3 Type of Debridement: Sharp Excisional Tissue Removed:  Non-viable soft tissue Blood loss: Minimal (<50cc) Depth of Debridement: subcutaneous tissue. Technique: Sharp excisional debridement to bleeding, viable wound base.  Wound Progress: The wound is about the same.  We will continue to monitor the progression of the wound with negative pressure wound therapy Dressing: Dry, sterile, compression dressing. Disposition: Patient tolerated procedure well. Patient to return in 1  week for follow-up.  No follow-ups on file.

## 2022-04-17 ENCOUNTER — Ambulatory Visit: Payer: No Typology Code available for payment source | Admitting: Physician Assistant

## 2022-04-17 ENCOUNTER — Encounter (INDEPENDENT_AMBULATORY_CARE_PROVIDER_SITE_OTHER): Payer: Self-pay

## 2022-04-18 ENCOUNTER — Encounter: Payer: Self-pay | Admitting: Podiatry

## 2022-04-24 ENCOUNTER — Encounter: Payer: Self-pay | Admitting: Family Medicine

## 2022-04-24 NOTE — Telephone Encounter (Signed)
Yes we can try that, but have him find out which brand his insurance prefers

## 2022-04-25 ENCOUNTER — Ambulatory Visit (INDEPENDENT_AMBULATORY_CARE_PROVIDER_SITE_OTHER): Payer: No Typology Code available for payment source | Admitting: Podiatry

## 2022-04-25 DIAGNOSIS — M86472 Chronic osteomyelitis with draining sinus, left ankle and foot: Secondary | ICD-10-CM | POA: Diagnosis not present

## 2022-04-25 DIAGNOSIS — Z794 Long term (current) use of insulin: Secondary | ICD-10-CM

## 2022-04-25 DIAGNOSIS — I999 Unspecified disorder of circulatory system: Secondary | ICD-10-CM | POA: Diagnosis not present

## 2022-04-25 DIAGNOSIS — E1140 Type 2 diabetes mellitus with diabetic neuropathy, unspecified: Secondary | ICD-10-CM | POA: Diagnosis not present

## 2022-04-28 NOTE — Telephone Encounter (Signed)
The RX is ready to fax  

## 2022-05-01 ENCOUNTER — Other Ambulatory Visit: Payer: Self-pay | Admitting: Family Medicine

## 2022-05-02 ENCOUNTER — Telehealth (INDEPENDENT_AMBULATORY_CARE_PROVIDER_SITE_OTHER): Payer: Self-pay

## 2022-05-02 NOTE — Progress Notes (Addendum)
Subjective:  Patient ID: [name], male    DOB: 1955/07/18,  MRN: [id number]  Chief Complaint  Patient presents with   Wound Check    67 y.o. male presents for wound care.  Patient presents with complaint of left heel wound.  He states that he has been doing wound VAC.  He has not noticed much difference.  He wanted to get it evaluated.  Denies any other acute complaints   Review of Systems: Negative except as noted in the HPI. Denies N/V/F/Ch.  Past Medical History:  Diagnosis Date   Diabetes mellitus    Hematospermia    Hypertension    Nephrolithiasis 02/07/07   Viral meningitis     Current Outpatient Medications:    ACCU-CHEK GUIDE test strip, Use to test blood sugars twice daily., Disp: 200 strip, Rfl: 3   Accu-Chek Softclix Lancets lancets, daily., Disp: , Rfl:    ALPRAZolam (XANAX) 0.5 MG tablet, Take 1 tablet (0.5 mg total) by mouth 3 (three) times daily as needed for anxiety or sleep., Disp: 270 tablet, Rfl: 1   Blood Glucose Monitoring Suppl (ACCU-CHEK GUIDE ME) w/Device KIT, USE AS DIRECTED EVERY DAY, Disp: , Rfl:    furosemide (LASIX) 20 MG tablet, Take 1 tablet (20 mg total) by mouth daily as needed for fluid., Disp: 90 tablet, Rfl: 3   insulin isophane & regular human KwikPen (NOVOLIN 70/30 KWIKPEN) (70-30) 100 UNIT/ML KwikPen, Inject 25 Units into the skin 2 (two) times daily with a meal., Disp: 300 mL, Rfl: 3   Insulin Pen Needle 29G X 12.7MM MISC, Use as directed to inject novilin, Disp: 200 each, Rfl: 3   levothyroxine (SYNTHROID) 50 MCG tablet, TAKE 1 TABLET BY MOUTH EVERY DAY, Disp: 90 tablet, Rfl: 1   metoprolol succinate (TOPROL-XL) 25 MG 24 hr tablet, Take 1 tablet (25 mg total) by mouth daily., Disp: 90 tablet, Rfl: 3   Multiple Vitamin (MULTIVITAMIN) tablet, Take 1 tablet by mouth daily., Disp: , Rfl:    temazepam (RESTORIL) 30 MG capsule, TAKE 1 CAPSULE BY MOUTH AT BEDTIME AS NEEDED FOR SLEEP, Disp: 90 capsule, Rfl: 1  Social History   Tobacco Use   Smoking Status Former  Smokeless Tobacco Never  Tobacco Comments   22 yrs ago     No Known Allergies Objective:  There were no vitals filed for this visit. There is no height or weight on file to calculate BMI. Constitutional Well developed. Well nourished.  Vascular Dorsalis pedis pulses nonpalpable bilaterally. Posterior tibial pulses nonpalpable bilaterally. Capillary refill normal to all digits.  No cyanosis or clubbing noted. Pedal hair growth normal.  Neurologic Normal speech. Oriented to person, place, and time. Protective sensation absent  Dermatologic Wound Location: Left heel wound fat layer exposed Wound Base: Regular Peri-wound: Calloused Exudate: Scant/small amount Serosanguinous exudate Wound Measurements: -See below  Orthopedic: No pain to palpation either foot.   Radiographs: 1. Redemonstration of posterior inferior calcaneal wound. There is again adjacent subcortical marrow edema within the posterior calcaneus, with slightly decreased intensity compared to 11/03/2021 body similar distribution. 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 No definitive cortical erosion is seen. 2. No abscess is seen. Assessment:   No diagnosis found.  Plan:  Patient was evaluated and treated and all questions answered.  Ulcer left heel ulceration with fat layer exposed -Debridement as below. -Dressed with Betadine wet-to-dry, DSD. -Continue off-loading with surgical shoe. -Discontinue wound VAC.  We will plan on doing Betadine wet-to-dry dressing as there is maceration and  no improvement of wound. -I reviewed the MRI with the patient in extensive detail.  There is no definitive cortical erosion at this time so we clinically have time to improve his circulation.  In the future patient may need biopsy of the bone. -I urged him to reach out to vein and vascular to see if there is anything that can be done to increase the blood flow. -I have placed a referral for him to be  seen by vascular surgery to see if there is anything that could be done to improve the circulation to the heel as he is a high risk of undergoing below the knee amputation  Procedure: Excisional Debridement of Wound Tool: Sharp chisel blade/tissue nipper Rationale: Removal of non-viable soft tissue from the wound to promote healing.  Anesthesia: none Pre-Debridement Wound Measurements: 3 cm x 1.5 cm x 0.3 cm  Post-Debridement Wound Measurements: 3.1 x 1.6 x 0.3 Type of Debridement: Sharp Excisional Tissue Removed: Non-viable soft tissue Blood loss: Minimal (<50cc) Depth of Debridement: subcutaneous tissue. Technique: Sharp excisional debridement to bleeding, viable wound base.  Wound Progress: The wound depth and size is about the same.  Even with negative pressure wound therapy. Dressing: Dry, sterile, compression dressing. Disposition: Patient tolerated procedure well. Patient to return in 1 week for follow-up.  No follow-ups on file.

## 2022-05-02 NOTE — Telephone Encounter (Signed)
Pt Rx was faxed to the pharmacy as requested

## 2022-05-02 NOTE — Telephone Encounter (Signed)
Per Dr. Wyn Quaker, bring pt in for an ABI + consult ASAP. I LVM for pt TCB and see if he could make a 10:00 [place] and then f/u with Dr. Wyn Quaker.   ASAP np. ABI + consult. non healing heel ulcer. referred by patel, Caryn Bee

## 2022-05-03 ENCOUNTER — Other Ambulatory Visit: Payer: Self-pay | Admitting: Family Medicine

## 2022-05-03 DIAGNOSIS — E039 Hypothyroidism, unspecified: Secondary | ICD-10-CM

## 2022-05-03 NOTE — Progress Notes (Signed)
MRN : [id number]  [name] is a 66 y.o. (June 01, 1956) male who presents with chief complaint of check circulation.  History of Present Illness:   The patient returns to the office for evaluation of painful lower extremities and diminished pulses associated with ulceration of the foot.  The patient notes the ulcer has been present for multiple weeks and has not been improving.  It is essentially unchanged since his last visit.  It is very painful and has had some drainage.  No specific history of trauma noted by the patient.  The patient denies fever or chills.  the patient does have diabetes which has been difficult to control.   Patient notes prior to the ulcer developing the extremities were painful particularly with walking.   The patient denies rest pain or dangling of an extremity off the side of the bed during the night for relief. No prior interventions or surgeries.   No history of back problems or DJD of the lumbar sacral spine.    The patient denies amaurosis fugax or recent TIA symptoms. There are no recent neurological changes noted. The patient denies history of DVT, PE or superficial thrombophlebitis. The patient denies recent episodes of angina or shortness of breath.     ABI dated 05/04/2022 show Rt=1.01(TBI=0.71) and LT=0.98(TBI=0.54) (previous ABI Rt=1.24(TBI=0.50) and LT=0.98(TBI=0.51))  No outpatient medications have been marked as taking for the 05/04/22 encounter (Appointment) with Gilda Crease, Latina Craver, MD.    Past Medical History:  Diagnosis Date   Diabetes mellitus    Hematospermia    Hypertension    Nephrolithiasis 02/07/07   Viral meningitis     Past Surgical History:  Procedure Laterality Date   CHOLECYSTECTOMY  12-07-14   laparoscopic, per Dr. Rayburn Ma     Social History Social History   Tobacco Use   Smoking status: Former   Smokeless tobacco: Never   Tobacco comments:    22 yrs ago   Substance Use Topics   Alcohol use:  No    Alcohol/week: 0.0 standard drinks of alcohol   Drug use: No    Family History Family History  Problem Relation Age of Onset   Rheum arthritis Mother    Diabetes Father    Heart disease Father    Cholecystitis Sister    Cholecystitis Brother    Diabetes Brother    Diabetes Brother    Parkinson's disease Brother    Heart disease Sister    Diabetes Sister    Cholecystitis Sister    Diabetes Sister    Arthritis Other    Hypertension Other    Stroke Other    Coronary artery disease Other    Birth defects Neg Hx     No Known Allergies   REVIEW OF SYSTEMS (Negative unless checked)  Constitutional: [] Weight loss  [] Fever  [] Chills Cardiac: [] Chest pain   [] Chest pressure   [] Palpitations   [] Shortness of breath when laying flat   [] Shortness of breath with exertion. Vascular:  [x] Pain in legs with walking   [] Pain in legs at rest  [] History of DVT   [] Phlebitis   [] Swelling in legs   [] Varicose veins   [] Non-healing ulcers Pulmonary:   [] Uses home oxygen   [] Productive cough   [] Hemoptysis   [] Wheeze  [] COPD   [] Asthma Neurologic:  [] Dizziness   [] Seizures   [] History of stroke   [] History of TIA  [] Aphasia   [] Vissual  changes   [] Weakness or numbness in arm   [x] Weakness or numbness in leg Musculoskeletal:   [] Joint swelling   [] Joint pain   [] Low back pain Hematologic:  [] Easy bruising  [] Easy bleeding   [] Hypercoagulable state   [] Anemic Gastrointestinal:  [] Diarrhea   [] Vomiting  [] Gastroesophageal reflux/heartburn   [] Difficulty swallowing. Genitourinary:  [] Chronic kidney disease   [] Difficult urination  [] Frequent urination   [] Blood in urine Skin:  [] Rashes   [x] Ulcers  Psychological:  [x] History of anxiety   []  History of major depression.  Physical Examination  There were no vitals filed for this visit. There is no height or weight on file to calculate BMI. Gen: WD/WN, NAD Head: [place]/AT, No temporalis wasting.  Ear/Nose/Throat: Hearing grossly intact, nares w/o  erythema or drainage Eyes: PER, EOMI, sclera nonicteric.  Neck: Supple, no masses.  No bruit or JVD.  Pulmonary:  Good air movement, no audible wheezing, no use of accessory muscles.  Cardiac: RRR, normal S1, S2, no Murmurs. Vascular:  mild trophic changes, Left foot open wound Vessel Right Left  Radial Palpable Palpable  PT Not Palpable Not Palpable  DP Not Palpable Not Palpable  Gastrointestinal: soft, non-distended. No guarding/no peritoneal signs.  Musculoskeletal: M/S 5/5 throughout.  No visible deformity.  Neurologic: CN 2-12 intact. Pain and light touch intact in extremities.  Symmetrical.  Speech is fluent. Motor exam as listed above. Psychiatric: Judgment intact, Mood & affect appropriate for pt's clinical situation. Dermatologic: No rashes or ulcers noted.  No changes consistent with cellulitis.   CBC Lab Results  Component Value Date   WBC 8.7 08/23/2021   HGB 13.9 08/23/2021   HCT 39.4 08/23/2021   MCV 95.1 08/23/2021   PLT 165.0 08/23/2021    BMET    Component Value Date/Time   NA 141 08/23/2021 1030   K 4.8 08/23/2021 1030   CL 104 08/23/2021 1030   CO2 30 08/23/2021 1030   GLUCOSE 112 (H) 08/23/2021 1030   BUN 25 (H) 08/23/2021 1030   CREATININE 0.97 08/23/2021 1030   CREATININE 1.31 (H) 03/15/2020 1700   CALCIUM 9.4 08/23/2021 1030   GFRNONAA >60 01/10/2020 1107   GFRAA >60 01/10/2020 1107   CrCl cannot be calculated (Patient's most recent lab result is older than the maximum 21 days allowed.).  COAG No results found for: "INR", "PROTIME"  Radiology MR HEEL LEFT WO CONTRAST  Result Date: 04/05/2022 CLINICAL DATA:  Follow-up imaging. Bruise 8 months ago turn into open wound. Evaluate for left heel osteomyelitis. EXAM: MR OF THE LEFT HEEL WITHOUT CONTRAST TECHNIQUE: Multiplanar, multisequence MR imaging of the left ankle was performed. No intravenous contrast was administered. COMPARISON:  left foot radiographs 10/17/2021, MRI right ankle 11/03/2021  FINDINGS: TENDONS Peroneal: The peroneus longus and brevis tendons are intact. Posteromedial: Minimal posterior tibial tenosynovitis, similar to prior. The flexor digitorum longus and flexor hallucis longus tendons are intact. Anterior: The tibialis anterior, extensor hallucis longus, and extensor digitorum longus tendons are intact. Achilles: Intact. Plantar Fascia: Intact. LIGAMENTS Lateral: The anterior and posterior talofibular, anterior and posterior tibiofibular, and calcaneofibular ligaments are intact. Medial: The tibiotalar deep deltoid and tibial spring ligaments are intact. CARTILAGE Ankle Joint: Intact cartilage. Subtalar Joints/Sinus Tarsi: Fat is preserved within sinus tarsi. Soft tissue and bones: Minimal dorsal talonavicular cartilage thinning and peripheral osteophytosis. There is again a soft tissue wound at the posterior aspect of the distal calcaneus with associated subcutaneous fat edema. There is mildly decreased intensity but similar distribution of mild subcortical marrow  edema within the posteroinferior calcaneus. This is again just distal to the Achilles tendon insertion. No definitive cortical erosion is seen. There is again edema within the abductor hallucis, flexor digitorum brevis, and flexor hallucis longus muscle is greater than the abductor digiti minimi muscle, not simply changed from prior. This is again likely neurogenic. The Lisfranc ligament complex is intact. There is again moderate edema and swelling of the dorsal midfoot subcutaneous fat. IMPRESSION: Compared to 11/03/2021: 1. Redemonstration of posterior inferior calcaneal wound. There is again adjacent subcortical marrow edema within the posterior calcaneus, with slightly decreased intensity compared to 11/03/2021 body similar distribution. 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 No definitive cortical erosion is seen. 2. No abscess is seen. Electronically Signed   By: Neita Garnet M.D.   On: 04/05/2022 13:42      Assessment/Plan 1. Atherosclerosis of native arteries of the extremities with ulceration (HCC)  Recommend:  The patient has evidence of severe atherosclerotic changes of both lower extremities associated with ulceration and tissue loss of the left foot.  This represents a limb threatening ischemia and places the patient at the risk for left limb loss.  Patient should undergo angiography of the left lower extremity with the hope for intervention for limb salvage.  The risks and benefits as well as the alternative therapies was discussed in detail with the patient.  All questions were answered.  Patient agrees to proceed with left leg angiography.  The patient will follow up with me in the office after the procedure.  - VAS [place] ABI WITH/WO TBI  2. Essential hypertension Continue antihypertensive medications as already ordered, these medications have been reviewed and there are no changes at this time.  3. Type 2 diabetes mellitus with diabetic neuropathy, with [place]-term current use of insulin (HCC) Continue hypoglycemic medications as already ordered, these medications have been reviewed and there are no changes at this time.  Hgb A1C to be monitored as already arranged by primary service  4. Hypothyroidism, unspecified type Continue hormone replacement as ordered and reviewed, no changes at this time    Levora Dredge, MD  05/03/2022 2:54 PM

## 2022-05-03 NOTE — Telephone Encounter (Addendum)
Last refill-05/20/2021--270 tabs, 1 refill Last OV-01/02/22  No future OV scheduled.

## 2022-05-03 NOTE — H&P (View-Only) (Signed)
               MRN : [id number]  [name] is a 66 y.o. (04/01/1956) male who presents with chief complaint of check circulation.  History of Present Illness:   The patient returns to the office for evaluation of painful lower extremities and diminished pulses associated with ulceration of the foot.  The patient notes the ulcer has been present for multiple weeks and has not been improving.  It is essentially unchanged since his last visit.  It is very painful and has had some drainage.  No specific history of trauma noted by the patient.  The patient denies fever or chills.  the patient does have diabetes which has been difficult to control.   Patient notes prior to the ulcer developing the extremities were painful particularly with walking.   The patient denies rest pain or dangling of an extremity off the side of the bed during the night for relief. No prior interventions or surgeries.   No history of back problems or DJD of the lumbar sacral spine.    The patient denies amaurosis fugax or recent TIA symptoms. There are no recent neurological changes noted. The patient denies history of DVT, PE or superficial thrombophlebitis. The patient denies recent episodes of angina or shortness of breath.     ABI dated 05/04/2022 show Rt=1.01(TBI=0.71) and LT=0.98(TBI=0.54) (previous ABI Rt=1.24(TBI=0.50) and LT=0.98(TBI=0.51))  No outpatient medications have been marked as taking for the 05/04/22 encounter (Appointment) with Kinzly Pierrelouis G, MD.    Past Medical History:  Diagnosis Date   Diabetes mellitus    Hematospermia    Hypertension    Nephrolithiasis 02/07/07   Viral meningitis     Past Surgical History:  Procedure Laterality Date   CHOLECYSTECTOMY  12-07-14   laparoscopic, per Dr. Blackmon     Social History Social History   Tobacco Use   Smoking status: Former   Smokeless tobacco: Never   Tobacco comments:    22 yrs ago   Substance Use Topics   Alcohol use:  No    Alcohol/week: 0.0 standard drinks of alcohol   Drug use: No    Family History Family History  Problem Relation Age of Onset   Rheum arthritis Mother    Diabetes Father    Heart disease Father    Cholecystitis Sister    Cholecystitis Brother    Diabetes Brother    Diabetes Brother    Parkinson's disease Brother    Heart disease Sister    Diabetes Sister    Cholecystitis Sister    Diabetes Sister    Arthritis Other    Hypertension Other    Stroke Other    Coronary artery disease Other    Birth defects Neg Hx     No Known Allergies   REVIEW OF SYSTEMS (Negative unless checked)  Constitutional: []Weight loss  []Fever  []Chills Cardiac: []Chest pain   []Chest pressure   []Palpitations   []Shortness of breath when laying flat   []Shortness of breath with exertion. Vascular:  [x]Pain in legs with walking   []Pain in legs at rest  []History of DVT   []Phlebitis   []Swelling in legs   []Varicose veins   []Non-healing ulcers Pulmonary:   []Uses home oxygen   []Productive cough   []Hemoptysis   []Wheeze  []COPD   []Asthma Neurologic:  []Dizziness   []Seizures   []History of stroke   []History of TIA  []Aphasia   []Vissual   changes   []Weakness or numbness in arm   [x]Weakness or numbness in leg Musculoskeletal:   []Joint swelling   []Joint pain   []Low back pain Hematologic:  []Easy bruising  []Easy bleeding   []Hypercoagulable state   []Anemic Gastrointestinal:  []Diarrhea   []Vomiting  []Gastroesophageal reflux/heartburn   []Difficulty swallowing. Genitourinary:  []Chronic kidney disease   []Difficult urination  []Frequent urination   []Blood in urine Skin:  []Rashes   [x]Ulcers  Psychological:  [x]History of anxiety   [] History of major depression.  Physical Examination  There were no vitals filed for this visit. There is no height or weight on file to calculate BMI. Gen: WD/WN, NAD Head: [place]/AT, No temporalis wasting.  Ear/Nose/Throat: Hearing grossly intact, nares w/o  erythema or drainage Eyes: PER, EOMI, sclera nonicteric.  Neck: Supple, no masses.  No bruit or JVD.  Pulmonary:  Good air movement, no audible wheezing, no use of accessory muscles.  Cardiac: RRR, normal S1, S2, no Murmurs. Vascular:  mild trophic changes, Left foot open wound Vessel Right Left  Radial Palpable Palpable  PT Not Palpable Not Palpable  DP Not Palpable Not Palpable  Gastrointestinal: soft, non-distended. No guarding/no peritoneal signs.  Musculoskeletal: M/S 5/5 throughout.  No visible deformity.  Neurologic: CN 2-12 intact. Pain and light touch intact in extremities.  Symmetrical.  Speech is fluent. Motor exam as listed above. Psychiatric: Judgment intact, Mood & affect appropriate for pt's clinical situation. Dermatologic: No rashes or ulcers noted.  No changes consistent with cellulitis.   CBC Lab Results  Component Value Date   WBC 8.7 08/23/2021   HGB 13.9 08/23/2021   HCT 39.4 08/23/2021   MCV 95.1 08/23/2021   PLT 165.0 08/23/2021    BMET    Component Value Date/Time   NA 141 08/23/2021 1030   K 4.8 08/23/2021 1030   CL 104 08/23/2021 1030   CO2 30 08/23/2021 1030   GLUCOSE 112 (H) 08/23/2021 1030   BUN 25 (H) 08/23/2021 1030   CREATININE 0.97 08/23/2021 1030   CREATININE 1.31 (H) 03/15/2020 1700   CALCIUM 9.4 08/23/2021 1030   GFRNONAA >60 01/10/2020 1107   GFRAA >60 01/10/2020 1107   CrCl cannot be calculated (Patient's most recent lab result is older than the maximum 21 days allowed.).  COAG No results found for: "INR", "PROTIME"  Radiology MR HEEL LEFT WO CONTRAST  Result Date: 04/05/2022 CLINICAL DATA:  Follow-up imaging. Bruise 8 months ago turn into open wound. Evaluate for left heel osteomyelitis. EXAM: MR OF THE LEFT HEEL WITHOUT CONTRAST TECHNIQUE: Multiplanar, multisequence MR imaging of the left ankle was performed. No intravenous contrast was administered. COMPARISON:  left foot radiographs 10/17/2021, MRI right ankle 11/03/2021  FINDINGS: TENDONS Peroneal: The peroneus longus and brevis tendons are intact. Posteromedial: Minimal posterior tibial tenosynovitis, similar to prior. The flexor digitorum longus and flexor hallucis longus tendons are intact. Anterior: The tibialis anterior, extensor hallucis longus, and extensor digitorum longus tendons are intact. Achilles: Intact. Plantar Fascia: Intact. LIGAMENTS Lateral: The anterior and posterior talofibular, anterior and posterior tibiofibular, and calcaneofibular ligaments are intact. Medial: The tibiotalar deep deltoid and tibial spring ligaments are intact. CARTILAGE Ankle Joint: Intact cartilage. Subtalar Joints/Sinus Tarsi: Fat is preserved within sinus tarsi. Soft tissue and bones: Minimal dorsal talonavicular cartilage thinning and peripheral osteophytosis. There is again a soft tissue wound at the posterior aspect of the distal calcaneus with associated subcutaneous fat edema. There is mildly decreased intensity but similar distribution of mild subcortical marrow   edema within the posteroinferior calcaneus. This is again just distal to the Achilles tendon insertion. No definitive cortical erosion is seen. There is again edema within the abductor hallucis, flexor digitorum brevis, and flexor hallucis longus muscle is greater than the abductor digiti minimi muscle, not simply changed from prior. This is again likely neurogenic. The Lisfranc ligament complex is intact. There is again moderate edema and swelling of the dorsal midfoot subcutaneous fat. IMPRESSION: Compared to 11/03/2021: 1. Redemonstration of posterior inferior calcaneal wound. There is again adjacent subcortical marrow edema within the posterior calcaneus, with slightly decreased intensity compared to 11/03/2021 body similar distribution. 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 No definitive cortical erosion is seen. 2. No abscess is seen. Electronically Signed   By: Neita Garnet M.D.   On: 04/05/2022 13:42      Assessment/Plan 1. Atherosclerosis of native arteries of the extremities with ulceration (HCC)  Recommend:  The patient has evidence of severe atherosclerotic changes of both lower extremities associated with ulceration and tissue loss of the left foot.  This represents a limb threatening ischemia and places the patient at the risk for left limb loss.  Patient should undergo angiography of the left lower extremity with the hope for intervention for limb salvage.  The risks and benefits as well as the alternative therapies was discussed in detail with the patient.  All questions were answered.  Patient agrees to proceed with left leg angiography.  The patient will follow up with me in the office after the procedure.  - VAS [place] ABI WITH/WO TBI  2. Essential hypertension Continue antihypertensive medications as already ordered, these medications have been reviewed and there are no changes at this time.  3. Type 2 diabetes mellitus with diabetic neuropathy, with [place]-term current use of insulin (HCC) Continue hypoglycemic medications as already ordered, these medications have been reviewed and there are no changes at this time.  Hgb A1C to be monitored as already arranged by primary service  4. Hypothyroidism, unspecified type Continue hormone replacement as ordered and reviewed, no changes at this time    Levora Dredge, MD  05/03/2022 2:54 PM

## 2022-05-04 ENCOUNTER — Encounter (INDEPENDENT_AMBULATORY_CARE_PROVIDER_SITE_OTHER): Payer: Self-pay | Admitting: Vascular Surgery

## 2022-05-04 ENCOUNTER — Ambulatory Visit (INDEPENDENT_AMBULATORY_CARE_PROVIDER_SITE_OTHER): Payer: No Typology Code available for payment source

## 2022-05-04 ENCOUNTER — Ambulatory Visit (INDEPENDENT_AMBULATORY_CARE_PROVIDER_SITE_OTHER): Payer: No Typology Code available for payment source | Admitting: Vascular Surgery

## 2022-05-04 VITALS — BP 138/66 | HR 77 | Resp 18 | Ht 68.0 in | Wt 228.8 lb

## 2022-05-04 DIAGNOSIS — E039 Hypothyroidism, unspecified: Secondary | ICD-10-CM | POA: Diagnosis not present

## 2022-05-04 DIAGNOSIS — Z794 Long term (current) use of insulin: Secondary | ICD-10-CM

## 2022-05-04 DIAGNOSIS — I7025 Atherosclerosis of native arteries of other extremities with ulceration: Secondary | ICD-10-CM | POA: Diagnosis not present

## 2022-05-04 DIAGNOSIS — E1140 Type 2 diabetes mellitus with diabetic neuropathy, unspecified: Secondary | ICD-10-CM

## 2022-05-04 DIAGNOSIS — I10 Essential (primary) hypertension: Secondary | ICD-10-CM

## 2022-05-06 ENCOUNTER — Other Ambulatory Visit: Payer: Self-pay | Admitting: Family Medicine

## 2022-05-06 DIAGNOSIS — E1140 Type 2 diabetes mellitus with diabetic neuropathy, unspecified: Secondary | ICD-10-CM

## 2022-05-07 ENCOUNTER — Encounter (INDEPENDENT_AMBULATORY_CARE_PROVIDER_SITE_OTHER): Payer: Self-pay | Admitting: Vascular Surgery

## 2022-05-08 ENCOUNTER — Telehealth (INDEPENDENT_AMBULATORY_CARE_PROVIDER_SITE_OTHER): Payer: Self-pay

## 2022-05-08 NOTE — Telephone Encounter (Signed)
Spoke with the patient and he is scheduled with Dr. Gilda Crease for a left leg angio on 05/23/22 with a 6:45 am [facility]. Pre-procedure instructions were discussed and will be mailed.

## 2022-05-09 ENCOUNTER — Ambulatory Visit (INDEPENDENT_AMBULATORY_CARE_PROVIDER_SITE_OTHER): Payer: No Typology Code available for payment source | Admitting: Podiatry

## 2022-05-09 DIAGNOSIS — L539 Erythematous condition, unspecified: Secondary | ICD-10-CM | POA: Diagnosis not present

## 2022-05-09 DIAGNOSIS — E1140 Type 2 diabetes mellitus with diabetic neuropathy, unspecified: Secondary | ICD-10-CM

## 2022-05-09 DIAGNOSIS — L97522 Non-pressure chronic ulcer of other part of left foot with fat layer exposed: Secondary | ICD-10-CM | POA: Diagnosis not present

## 2022-05-09 DIAGNOSIS — Z794 Long term (current) use of insulin: Secondary | ICD-10-CM

## 2022-05-09 MED ORDER — DOXYCYCLINE HYCLATE 100 MG PO TABS: 100.0000 mg | ORAL_TABLET | Freq: Two times a day (BID) | ORAL | 0 refills | Status: DC

## 2022-05-15 ENCOUNTER — Encounter: Payer: Self-pay | Admitting: Emergency Medicine

## 2022-05-15 ENCOUNTER — Ambulatory Visit: Payer: No Typology Code available for payment source | Admitting: Podiatry

## 2022-05-15 ENCOUNTER — Ambulatory Visit (INDEPENDENT_AMBULATORY_CARE_PROVIDER_SITE_OTHER): Payer: No Typology Code available for payment source

## 2022-05-15 DIAGNOSIS — S92002B Unspecified fracture of left calcaneus, initial encounter for open fracture: Secondary | ICD-10-CM

## 2022-05-15 DIAGNOSIS — Z79890 Hormone replacement therapy (postmenopausal): Secondary | ICD-10-CM

## 2022-05-15 DIAGNOSIS — Z87442 Personal history of urinary calculi: Secondary | ICD-10-CM

## 2022-05-15 DIAGNOSIS — I160 Hypertensive urgency: Secondary | ICD-10-CM | POA: Diagnosis present

## 2022-05-15 DIAGNOSIS — M86172 Other acute osteomyelitis, left ankle and foot: Secondary | ICD-10-CM | POA: Diagnosis present

## 2022-05-15 DIAGNOSIS — E1169 Type 2 diabetes mellitus with other specified complication: Secondary | ICD-10-CM | POA: Diagnosis present

## 2022-05-15 DIAGNOSIS — L0390 Cellulitis, unspecified: Secondary | ICD-10-CM | POA: Diagnosis not present

## 2022-05-15 DIAGNOSIS — R4182 Altered mental status, unspecified: Secondary | ICD-10-CM | POA: Diagnosis not present

## 2022-05-15 DIAGNOSIS — Z833 Family history of diabetes mellitus: Secondary | ICD-10-CM

## 2022-05-15 DIAGNOSIS — Z79899 Other long term (current) drug therapy: Secondary | ICD-10-CM

## 2022-05-15 DIAGNOSIS — S99922A Unspecified injury of left foot, initial encounter: Secondary | ICD-10-CM

## 2022-05-15 DIAGNOSIS — E11628 Type 2 diabetes mellitus with other skin complications: Secondary | ICD-10-CM | POA: Diagnosis present

## 2022-05-15 DIAGNOSIS — M86472 Chronic osteomyelitis with draining sinus, left ankle and foot: Secondary | ICD-10-CM | POA: Diagnosis not present

## 2022-05-15 DIAGNOSIS — Z87891 Personal history of nicotine dependence: Secondary | ICD-10-CM

## 2022-05-15 DIAGNOSIS — I999 Unspecified disorder of circulatory system: Secondary | ICD-10-CM

## 2022-05-15 DIAGNOSIS — E1152 Type 2 diabetes mellitus with diabetic peripheral angiopathy with gangrene: Principal | ICD-10-CM | POA: Diagnosis not present

## 2022-05-15 DIAGNOSIS — Z832 Family history of diseases of the blood and blood-forming organs and certain disorders involving the immune mechanism: Secondary | ICD-10-CM

## 2022-05-15 DIAGNOSIS — Z823 Family history of stroke: Secondary | ICD-10-CM

## 2022-05-15 DIAGNOSIS — Z821 Family history of blindness and visual loss: Secondary | ICD-10-CM

## 2022-05-15 DIAGNOSIS — L97426 Non-pressure chronic ulcer of left heel and midfoot with bone involvement without evidence of necrosis: Secondary | ICD-10-CM | POA: Diagnosis present

## 2022-05-15 DIAGNOSIS — E1140 Type 2 diabetes mellitus with diabetic neuropathy, unspecified: Secondary | ICD-10-CM | POA: Diagnosis present

## 2022-05-15 DIAGNOSIS — I10 Essential (primary) hypertension: Secondary | ICD-10-CM | POA: Diagnosis present

## 2022-05-15 DIAGNOSIS — I70262 Atherosclerosis of native arteries of extremities with gangrene, left leg: Secondary | ICD-10-CM | POA: Diagnosis not present

## 2022-05-15 DIAGNOSIS — B965 Pseudomonas (aeruginosa) (mallei) (pseudomallei) as the cause of diseases classified elsewhere: Secondary | ICD-10-CM | POA: Diagnosis present

## 2022-05-15 DIAGNOSIS — M84442A Pathological fracture, left hand, initial encounter for fracture: Secondary | ICD-10-CM | POA: Diagnosis present

## 2022-05-15 DIAGNOSIS — Z818 Family history of other mental and behavioral disorders: Secondary | ICD-10-CM

## 2022-05-15 DIAGNOSIS — Z9049 Acquired absence of other specified parts of digestive tract: Secondary | ICD-10-CM

## 2022-05-15 DIAGNOSIS — Z794 Long term (current) use of insulin: Secondary | ICD-10-CM

## 2022-05-15 DIAGNOSIS — E039 Hypothyroidism, unspecified: Secondary | ICD-10-CM | POA: Diagnosis present

## 2022-05-15 DIAGNOSIS — L03116 Cellulitis of left lower limb: Secondary | ICD-10-CM | POA: Diagnosis present

## 2022-05-15 DIAGNOSIS — Z8261 Family history of arthritis: Secondary | ICD-10-CM

## 2022-05-15 DIAGNOSIS — E11621 Type 2 diabetes mellitus with foot ulcer: Secondary | ICD-10-CM | POA: Diagnosis present

## 2022-05-15 DIAGNOSIS — G546 Phantom limb syndrome with pain: Secondary | ICD-10-CM | POA: Diagnosis not present

## 2022-05-15 DIAGNOSIS — Z8249 Family history of ischemic heart disease and other diseases of the circulatory system: Secondary | ICD-10-CM

## 2022-05-15 DIAGNOSIS — R4701 Aphasia: Secondary | ICD-10-CM | POA: Diagnosis not present

## 2022-05-15 DIAGNOSIS — M86672 Other chronic osteomyelitis, left ankle and foot: Secondary | ICD-10-CM | POA: Diagnosis present

## 2022-05-15 DIAGNOSIS — E1165 Type 2 diabetes mellitus with hyperglycemia: Secondary | ICD-10-CM | POA: Diagnosis present

## 2022-05-15 DIAGNOSIS — M66372 Spontaneous rupture of flexor tendons, left ankle and foot: Secondary | ICD-10-CM | POA: Diagnosis present

## 2022-05-15 DIAGNOSIS — Z820 Family history of epilepsy and other diseases of the nervous system: Secondary | ICD-10-CM

## 2022-05-15 NOTE — ED Provider Triage Note (Signed)
Emergency Medicine Provider Triage Evaluation Note  [name] , a 66 y.o. male  was evaluated in triage.  Pt has a history of diabetes, atherosclerosis with ulceration and hypertension presents to the emergency department with an infected wound of his left heel.  Patient was referred to the emergency department for admission by his PCP.  Patient has had fever and chills at home.  No chest pain or abdominal pain.  Review of Systems  Positive: Patient has infected wound of left heel.  Negative: No chest pain or abdominal pain.   Physical Exam  Ht 5\' 8"  (1.727 m)   Wt 104 kg   BMI 34.86 kg/m  Gen:   Awake, no distress   Resp:  Normal effort  MSK:   Moves extremities without difficulty  Other:    Medical Decision Making  Medically screening exam initiated at 7:11 PM.  Appropriate orders placed.  [name] was informed that the remainder of the evaluation will be completed by another provider, this initial triage assessment does not replace that evaluation, and the importance of remaining in the ED until their evaluation is complete.     [name], [place] 05/15/22 1913

## 2022-05-15 NOTE — ED Triage Notes (Signed)
Pt presents via POV with complaints of left heel wound that hasn't healed. Pt has DM and states having a fever Thursday evening but resolved. Pt states that he was seen by his PCP who advised him to come in for IV antibiotics. Pt shows images of the wound which is dark in color. Denies chills, N/V/D, CP or SOB.

## 2022-05-15 NOTE — Progress Notes (Signed)
  Subjective:  Patient ID: [name], male    DOB: 03/08/1956,  MRN: [id number]  Chief Complaint  Patient presents with   Foot Ulcer    Wound care left foot heel ulcer - patient was getting in the shower last night and felt a "pop" across his toes on the top of his foot. Having more drainage from ulcer    66 y.o. male presents with the above complaint. History confirmed with patient.   Objective:  Physical Exam: warm, good capillary refill and nonpalpable pulses, large necrotic ulceration with serous bloody drainage posterior heel, he has decreased plantarflexion strength.   Radiographs: Multiple views x-ray of the left foot: New osteolysis posterior calcaneus with avulsion of the posterior enthesophyte dorsally Assessment:   1. Avulsion fracture of calcaneus, left, open, initial encounter   2. 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 Type 2 diabetes mellitus with diabetic neuropathy, with [place]-term current use of insulin (HCC)   4. Vascular abnormality      Plan:  Patient was evaluated and treated and all questions answered.  I reviewed the radiographs with the patient and his wife who is present today at bedside.  We discussed the severity of this injury and likelihood of infection.  I think there is a high chance of new osteomyelitis of the calcaneus.  He has a very poor chance of limb salvage at this point.  Due to the infection and severity of the injury the wound and bone would likely need extensive debridement grafting external fixation to offload the wound as well as vascular intervention.  Possibly may need a free tissue flap transfer which he would need specialty care at [facility] or [facility] for this.  I think the chance of this being successful is quite low.  I do think it be best to evaluate this further before proceeding, he needs a new MRI and immediate IV antibiotics as this is a form of an open fracture.  I have advised him to go to the emergency room  tonight and they will do this.  Vascular surgery should be consulted.  IV antibiotics can be started with broad-spectrum antibiotics.  New MRI of the heel should be taken as well.  Pending results of the above we will consider his further options.  Likely expect below-knee amputation and transition to a prosthetic will be the highest chance of success in a reasonable timeframe.  Limb salvage likely would take 6 to 12 months and has a low chance of success in the end.  I placed him in a cam walker boot to use for now until he is able to get to the emergency room  No follow-ups on file.

## 2022-05-16 ENCOUNTER — Other Ambulatory Visit: Payer: Self-pay

## 2022-05-16 ENCOUNTER — Encounter
Admission: EM | Disposition: A | Discharge status: Home Health | Payer: Self-pay | Source: Home / Self Care | Attending: Internal Medicine

## 2022-05-16 ENCOUNTER — Inpatient Hospital Stay
Admission: EM | Admit: 2022-05-16 | Discharge: 2022-05-23 | DRG: 240 | Disposition: A | Discharge status: Home Health | Payer: No Typology Code available for payment source | Attending: Internal Medicine | Admitting: Internal Medicine

## 2022-05-16 ENCOUNTER — Emergency Department: Payer: No Typology Code available for payment source

## 2022-05-16 DIAGNOSIS — L97929 Non-pressure chronic ulcer of unspecified part of left lower leg with unspecified severity: Secondary | ICD-10-CM

## 2022-05-16 DIAGNOSIS — M86472 Chronic osteomyelitis with draining sinus, left ankle and foot: Secondary | ICD-10-CM | POA: Diagnosis present

## 2022-05-16 DIAGNOSIS — L0390 Cellulitis, unspecified: Secondary | ICD-10-CM | POA: Diagnosis present

## 2022-05-16 DIAGNOSIS — S92032B Displaced avulsion fracture of tuberosity of left calcaneus, initial encounter for open fracture: Secondary | ICD-10-CM | POA: Diagnosis not present

## 2022-05-16 DIAGNOSIS — G546 Phantom limb syndrome with pain: Secondary | ICD-10-CM | POA: Diagnosis not present

## 2022-05-16 DIAGNOSIS — E039 Hypothyroidism, unspecified: Secondary | ICD-10-CM | POA: Diagnosis present

## 2022-05-16 DIAGNOSIS — I96 Gangrene, not elsewhere classified: Secondary | ICD-10-CM | POA: Diagnosis not present

## 2022-05-16 DIAGNOSIS — B965 Pseudomonas (aeruginosa) (mallei) (pseudomallei) as the cause of diseases classified elsewhere: Secondary | ICD-10-CM | POA: Diagnosis present

## 2022-05-16 DIAGNOSIS — M86672 Other chronic osteomyelitis, left ankle and foot: Secondary | ICD-10-CM | POA: Diagnosis present

## 2022-05-16 DIAGNOSIS — Z87442 Personal history of urinary calculi: Secondary | ICD-10-CM | POA: Diagnosis not present

## 2022-05-16 DIAGNOSIS — E1165 Type 2 diabetes mellitus with hyperglycemia: Secondary | ICD-10-CM | POA: Diagnosis present

## 2022-05-16 DIAGNOSIS — I10 Essential (primary) hypertension: Secondary | ICD-10-CM | POA: Diagnosis present

## 2022-05-16 DIAGNOSIS — I70249 Atherosclerosis of native arteries of left leg with ulceration of unspecified site: Secondary | ICD-10-CM | POA: Diagnosis not present

## 2022-05-16 DIAGNOSIS — E1169 Type 2 diabetes mellitus with other specified complication: Secondary | ICD-10-CM | POA: Diagnosis present

## 2022-05-16 DIAGNOSIS — M869 Osteomyelitis, unspecified: Secondary | ICD-10-CM | POA: Insufficient documentation

## 2022-05-16 DIAGNOSIS — E1140 Type 2 diabetes mellitus with diabetic neuropathy, unspecified: Secondary | ICD-10-CM | POA: Diagnosis present

## 2022-05-16 DIAGNOSIS — L97426 Non-pressure chronic ulcer of left heel and midfoot with bone involvement without evidence of necrosis: Secondary | ICD-10-CM | POA: Diagnosis present

## 2022-05-16 DIAGNOSIS — I70262 Atherosclerosis of native arteries of extremities with gangrene, left leg: Secondary | ICD-10-CM | POA: Diagnosis not present

## 2022-05-16 DIAGNOSIS — E1152 Type 2 diabetes mellitus with diabetic peripheral angiopathy with gangrene: Secondary | ICD-10-CM | POA: Diagnosis not present

## 2022-05-16 DIAGNOSIS — E08621 Diabetes mellitus due to underlying condition with foot ulcer: Secondary | ICD-10-CM | POA: Diagnosis not present

## 2022-05-16 DIAGNOSIS — M8610 Other acute osteomyelitis, unspecified site: Secondary | ICD-10-CM

## 2022-05-16 DIAGNOSIS — L97429 Non-pressure chronic ulcer of left heel and midfoot with unspecified severity: Secondary | ICD-10-CM | POA: Diagnosis not present

## 2022-05-16 DIAGNOSIS — Z79890 Hormone replacement therapy (postmenopausal): Secondary | ICD-10-CM | POA: Diagnosis not present

## 2022-05-16 DIAGNOSIS — R4701 Aphasia: Secondary | ICD-10-CM | POA: Diagnosis not present

## 2022-05-16 DIAGNOSIS — Z9049 Acquired absence of other specified parts of digestive tract: Secondary | ICD-10-CM | POA: Diagnosis not present

## 2022-05-16 DIAGNOSIS — M86172 Other acute osteomyelitis, left ankle and foot: Secondary | ICD-10-CM | POA: Diagnosis present

## 2022-05-16 DIAGNOSIS — I160 Hypertensive urgency: Secondary | ICD-10-CM | POA: Diagnosis present

## 2022-05-16 DIAGNOSIS — L03116 Cellulitis of left lower limb: Secondary | ICD-10-CM | POA: Diagnosis present

## 2022-05-16 DIAGNOSIS — E11628 Type 2 diabetes mellitus with other skin complications: Secondary | ICD-10-CM | POA: Diagnosis present

## 2022-05-16 DIAGNOSIS — M86072 Acute hematogenous osteomyelitis, left ankle and foot: Secondary | ICD-10-CM | POA: Diagnosis not present

## 2022-05-16 DIAGNOSIS — M84442A Pathological fracture, left hand, initial encounter for fracture: Secondary | ICD-10-CM | POA: Diagnosis present

## 2022-05-16 DIAGNOSIS — R4182 Altered mental status, unspecified: Secondary | ICD-10-CM | POA: Insufficient documentation

## 2022-05-16 DIAGNOSIS — Z87891 Personal history of nicotine dependence: Secondary | ICD-10-CM | POA: Diagnosis not present

## 2022-05-16 DIAGNOSIS — Z794 Long term (current) use of insulin: Secondary | ICD-10-CM | POA: Diagnosis not present

## 2022-05-16 DIAGNOSIS — E11621 Type 2 diabetes mellitus with foot ulcer: Secondary | ICD-10-CM | POA: Diagnosis present

## 2022-05-16 DIAGNOSIS — S92002A Unspecified fracture of left calcaneus, initial encounter for closed fracture: Secondary | ICD-10-CM | POA: Diagnosis present

## 2022-05-16 DIAGNOSIS — I7025 Atherosclerosis of native arteries of other extremities with ulceration: Secondary | ICD-10-CM | POA: Diagnosis present

## 2022-05-16 HISTORY — PX: LOWER EXTREMITY ANGIOGRAPHY: CATH118251

## 2022-05-16 LAB — COMPREHENSIVE METABOLIC PANEL
ALT: 22 U/L (ref 0–44)
AST: 22 U/L (ref 15–41)
Albumin: 3.7 g/dL (ref 3.5–5.0)
Alkaline Phosphatase: 120 U/L (ref 38–126)
Anion gap: 9 (ref 5–15)
BUN: 33 mg/dL — ABNORMAL HIGH (ref 8–23)
CO2: 25 mmol/L (ref 22–32)
Calcium: 9 mg/dL (ref 8.9–10.3)
Chloride: 101 mmol/L (ref 98–111)
Creatinine, Ser: 1.13 mg/dL (ref 0.61–1.24)
GFR, Estimated: >60 mL/min (ref >60)
Glucose, Bld: 167 mg/dL — ABNORMAL HIGH (ref 70–99)
Potassium: 3.6 mmol/L (ref 3.5–5.1)
Sodium: 135 mmol/L (ref 135–145)
Total Bilirubin: 1.1 mg/dL (ref 0.3–1.2)
Total Protein: 8.7 g/dL — ABNORMAL HIGH (ref 6.5–8.1)

## 2022-05-16 LAB — CBC WITH DIFFERENTIAL/PLATELET
Abs Immature Granulocytes: 0.05 10*3/uL (ref 0.00–0.07)
Basophils Absolute: 0.1 10*3/uL (ref 0.0–0.1)
Basophils Relative: 1 %
Eosinophils Absolute: 0.2 10*3/uL (ref 0.0–0.5)
Eosinophils Relative: 2 %
HCT: 38 % — ABNORMAL LOW (ref 39.0–52.0)
Hemoglobin: 12.7 g/dL — ABNORMAL LOW (ref 13.0–17.0)
Immature Granulocytes: 0 %
Lymphocytes Relative: 15 %
Lymphs Abs: 1.7 10*3/uL (ref 0.7–4.0)
MCH: 30.8 pg (ref 26.0–34.0)
MCHC: 33.4 g/dL (ref 30.0–36.0)
MCV: 92 fL (ref 80.0–100.0)
Monocytes Absolute: 0.9 10*3/uL (ref 0.1–1.0)
Monocytes Relative: 8 %
Neutro Abs: 8.3 10*3/uL — ABNORMAL HIGH (ref 1.7–7.7)
Neutrophils Relative %: 74 %
Platelets: 267 10*3/uL (ref 150–400)
RBC: 4.13 MIL/uL — ABNORMAL LOW (ref 4.22–5.81)
RDW: 12.4 % (ref 11.5–15.5)
WBC: 11.2 10*3/uL — ABNORMAL HIGH (ref 4.0–10.5)
nRBC: 0 % (ref 0.0–0.2)

## 2022-05-16 LAB — CBG MONITORING, ED
Glucose-Capillary: 134 mg/dL — ABNORMAL HIGH (ref 70–99)
Glucose-Capillary: 154 mg/dL — ABNORMAL HIGH (ref 70–99)

## 2022-05-16 LAB — PROCALCITONIN: Procalcitonin: 0.14 ng/mL

## 2022-05-16 LAB — GLUCOSE, CAPILLARY
Glucose-Capillary: 163 mg/dL — ABNORMAL HIGH (ref 70–99)
Glucose-Capillary: 161 mg/dL — ABNORMAL HIGH (ref 70–99)
Glucose-Capillary: 212 mg/dL — ABNORMAL HIGH (ref 70–99)
Glucose-Capillary: 185 mg/dL — ABNORMAL HIGH (ref 70–99)

## 2022-05-16 LAB — HIV ANTIBODY (ROUTINE TESTING W REFLEX): HIV Screen 4th Generation wRfx: NONREACTIVE

## 2022-05-16 LAB — LACTIC ACID, PLASMA
Lactic Acid, Venous: 1.2 mmol/L (ref 0.5–1.9)
Lactic Acid, Venous: 1.1 mmol/L (ref 0.5–1.9)

## 2022-05-16 SURGERY — LOWER EXTREMITY ANGIOGRAPHY
Anesthesia: Moderate Sedation | Laterality: Left

## 2022-05-16 MED ORDER — SODIUM CHLORIDE 0.9 % IV SOLN
2.0000 g | Freq: Once | INTRAVENOUS | Status: AC
  Administered 2022-05-16: 2 g via INTRAVENOUS
  Filled 2022-05-16: qty 12.5

## 2022-05-16 MED ORDER — OXYCODONE HCL 5 MG PO TABS: 5.0000 mg | ORAL_TABLET | ORAL | Status: DC | PRN

## 2022-05-16 MED ORDER — ONDANSETRON HCL 4 MG/2ML IJ SOLN: 4.0000 mg | Freq: Four times a day (QID) | INTRAMUSCULAR | Status: DC | PRN

## 2022-05-16 MED ORDER — ONDANSETRON HCL 4 MG PO TABS: 4.0000 mg | ORAL_TABLET | Freq: Four times a day (QID) | ORAL | Status: DC | PRN

## 2022-05-16 MED ORDER — LABETALOL HCL 5 MG/ML IV SOLN
10.0000 mg | INTRAVENOUS | Status: DC | PRN
  Administered 2022-05-16: 10 mg via INTRAVENOUS
  Filled 2022-05-16: qty 4

## 2022-05-16 MED ORDER — SODIUM CHLORIDE 0.9 % IV SOLN
INTRAVENOUS | Status: DC
  Administered 2022-05-16: 75 mL/h via INTRAVENOUS

## 2022-05-16 MED ORDER — FENTANYL CITRATE PF 50 MCG/ML IJ SOSY
PREFILLED_SYRINGE | INTRAMUSCULAR | Status: AC
  Filled 2022-05-16: qty 1

## 2022-05-16 MED ORDER — SODIUM CHLORIDE 0.9% FLUSH: 3.0000 mL | INTRAVENOUS | Status: DC | PRN

## 2022-05-16 MED ORDER — VANCOMYCIN HCL 2000 MG/400ML IV SOLN
2000.0000 mg | INTRAVENOUS | Status: DC
  Administered 2022-05-17: 2000 mg via INTRAVENOUS
  Filled 2022-05-16: qty 400

## 2022-05-16 MED ORDER — ACETAMINOPHEN 650 MG RE SUPP: 650.0000 mg | Freq: Four times a day (QID) | RECTAL | Status: DC | PRN

## 2022-05-16 MED ORDER — HYDRALAZINE HCL 20 MG/ML IJ SOLN: 10.0000 mg | Freq: Four times a day (QID) | INTRAMUSCULAR | Status: DC | PRN

## 2022-05-16 MED ORDER — MIDAZOLAM HCL 2 MG/2ML IJ SOLN
INTRAMUSCULAR | Status: AC
  Filled 2022-05-16: qty 4

## 2022-05-16 MED ORDER — CLOPIDOGREL BISULFATE 300 MG PO TABS
300.0000 mg | ORAL_TABLET | ORAL | Status: AC
  Administered 2022-05-16: 300 mg via ORAL

## 2022-05-16 MED ORDER — ONDANSETRON HCL 4 MG/2ML IJ SOLN
INTRAMUSCULAR | Status: AC
  Filled 2022-05-16: qty 2

## 2022-05-16 MED ORDER — AMLODIPINE BESYLATE 5 MG PO TABS
5.0000 mg | ORAL_TABLET | Freq: Every day | ORAL | Status: DC
  Administered 2022-05-16: 5 mg via ORAL
  Filled 2022-05-16: qty 1

## 2022-05-16 MED ORDER — ALPRAZOLAM 0.5 MG PO TABS
0.5000 mg | ORAL_TABLET | Freq: Three times a day (TID) | ORAL | Status: DC | PRN
  Administered 2022-05-16 – 2022-05-22 (×7): 0.5 mg via ORAL
  Filled 2022-05-16 (×6): qty 1

## 2022-05-16 MED ORDER — MIDAZOLAM HCL 2 MG/2ML IJ SOLN
INTRAMUSCULAR | Status: DC | PRN
  Administered 2022-05-16: 1 mg via INTRAVENOUS
  Administered 2022-05-16: 2 mg via INTRAVENOUS
  Administered 2022-05-16 (×3): 1 mg via INTRAVENOUS

## 2022-05-16 MED ORDER — ASPIRIN 81 MG PO TBEC
81.0000 mg | DELAYED_RELEASE_TABLET | Freq: Every day | ORAL | Status: DC
  Administered 2022-05-16 – 2022-05-23 (×7): 81 mg via ORAL
  Filled 2022-05-16 (×6): qty 1

## 2022-05-16 MED ORDER — MORPHINE SULFATE (PF) 2 MG/ML IV SOLN: 2.0000 mg | INTRAVENOUS | Status: DC | PRN

## 2022-05-16 MED ORDER — ACETAMINOPHEN 325 MG PO TABS
650.0000 mg | ORAL_TABLET | Freq: Four times a day (QID) | ORAL | Status: DC | PRN
  Administered 2022-05-20 – 2022-05-21 (×2): 650 mg via ORAL

## 2022-05-16 MED ORDER — SODIUM CHLORIDE 0.9 % IV SOLN: INTRAVENOUS | Status: AC

## 2022-05-16 MED ORDER — VANCOMYCIN HCL 2000 MG/400ML IV SOLN
2000.0000 mg | Freq: Once | INTRAVENOUS | Status: AC
  Administered 2022-05-16: 2000 mg via INTRAVENOUS
  Filled 2022-05-16: qty 400

## 2022-05-16 MED ORDER — CLOPIDOGREL BISULFATE 75 MG PO TABS
75.0000 mg | ORAL_TABLET | Freq: Every day | ORAL | Status: DC
  Administered 2022-05-17 – 2022-05-23 (×6): 75 mg via ORAL
  Filled 2022-05-16 (×6): qty 1

## 2022-05-16 MED ORDER — METOPROLOL SUCCINATE ER 25 MG PO TB24
25.0000 mg | ORAL_TABLET | Freq: Every day | ORAL | Status: DC
  Administered 2022-05-17 – 2022-05-23 (×6): 25 mg via ORAL
  Filled 2022-05-16 (×6): qty 1

## 2022-05-16 MED ORDER — SODIUM CHLORIDE 0.9% FLUSH
3.0000 mL | Freq: Two times a day (BID) | INTRAVENOUS | Status: DC
  Administered 2022-05-17 – 2022-05-18 (×2): 3 mL via INTRAVENOUS

## 2022-05-16 MED ORDER — SODIUM CHLORIDE 0.9 % IV SOLN
2.0000 g | Freq: Three times a day (TID) | INTRAVENOUS | Status: DC
  Administered 2022-05-16 – 2022-05-21 (×13): 2 g via INTRAVENOUS
  Filled 2022-05-16: qty 12.5
  Filled 2022-05-16: qty 2
  Filled 2022-05-16 (×2): qty 12.5
  Filled 2022-05-16: qty 2
  Filled 2022-05-16 (×5): qty 12.5
  Filled 2022-05-16: qty 2
  Filled 2022-05-16 (×4): qty 12.5

## 2022-05-16 MED ORDER — INSULIN ASPART 100 UNIT/ML IJ SOLN
0.0000 [IU] | INTRAMUSCULAR | Status: DC
  Administered 2022-05-16: 3 [IU] via SUBCUTANEOUS
  Administered 2022-05-16 – 2022-05-17 (×2): 5 [IU] via SUBCUTANEOUS
  Administered 2022-05-17: 2 [IU] via SUBCUTANEOUS
  Administered 2022-05-17: 3 [IU] via SUBCUTANEOUS
  Administered 2022-05-17: 2 [IU] via SUBCUTANEOUS
  Administered 2022-05-17: 3 [IU] via SUBCUTANEOUS
  Administered 2022-05-17 – 2022-05-18 (×3): 2 [IU] via SUBCUTANEOUS
  Administered 2022-05-18: 3 [IU] via SUBCUTANEOUS
  Administered 2022-05-18: 2 [IU] via SUBCUTANEOUS
  Administered 2022-05-18 – 2022-05-19 (×2): 3 [IU] via SUBCUTANEOUS
  Administered 2022-05-19 (×3): 5 [IU] via SUBCUTANEOUS
  Administered 2022-05-20 (×2): 3 [IU] via SUBCUTANEOUS
  Administered 2022-05-20: 2 [IU] via SUBCUTANEOUS
  Administered 2022-05-20 (×2): 3 [IU] via SUBCUTANEOUS
  Administered 2022-05-21: 2 [IU] via SUBCUTANEOUS
  Administered 2022-05-21: 3 [IU] via SUBCUTANEOUS
  Administered 2022-05-21 – 2022-05-22 (×4): 2 [IU] via SUBCUTANEOUS
  Administered 2022-05-22: 3 [IU] via SUBCUTANEOUS
  Administered 2022-05-22: 2 [IU] via SUBCUTANEOUS
  Administered 2022-05-23: 3 [IU] via SUBCUTANEOUS
  Administered 2022-05-23: 2 [IU] via SUBCUTANEOUS
  Filled 2022-05-16 (×34): qty 1

## 2022-05-16 MED ORDER — CLOPIDOGREL BISULFATE 75 MG PO TABS
ORAL_TABLET | ORAL | Status: AC
  Filled 2022-05-16: qty 4

## 2022-05-16 MED ORDER — SODIUM CHLORIDE 0.9 % IV SOLN: 250.0000 mL | INTRAVENOUS | Status: DC | PRN

## 2022-05-16 MED ORDER — HYDRALAZINE HCL 20 MG/ML IJ SOLN: 5.0000 mg | INTRAMUSCULAR | Status: DC | PRN

## 2022-05-16 MED ORDER — MORPHINE SULFATE (PF) 4 MG/ML IV SOLN: 2.0000 mg | INTRAVENOUS | Status: DC | PRN

## 2022-05-16 MED ORDER — HEPARIN SODIUM (PORCINE) 1000 UNIT/ML IJ SOLN
INTRAMUSCULAR | Status: DC | PRN
  Administered 2022-05-16: 6000 [IU] via INTRAVENOUS

## 2022-05-16 MED ORDER — LABETALOL HCL 5 MG/ML IV SOLN
INTRAVENOUS | Status: DC | PRN
  Administered 2022-05-16: 10 mg via INTRAVENOUS

## 2022-05-16 MED ORDER — LABETALOL HCL 5 MG/ML IV SOLN
INTRAVENOUS | Status: AC
  Filled 2022-05-16: qty 4

## 2022-05-16 MED ORDER — MIDAZOLAM HCL 2 MG/2ML IJ SOLN
INTRAMUSCULAR | Status: AC
  Filled 2022-05-16: qty 2

## 2022-05-16 MED ORDER — FENTANYL CITRATE (PF) 100 MCG/2ML IJ SOLN
INTRAMUSCULAR | Status: DC | PRN
  Administered 2022-05-16 (×3): 50 ug via INTRAVENOUS
  Administered 2022-05-16 (×3): 25 ug via INTRAVENOUS

## 2022-05-16 MED ORDER — HEPARIN SODIUM (PORCINE) 1000 UNIT/ML IJ SOLN
INTRAMUSCULAR | Status: AC
  Filled 2022-05-16: qty 10

## 2022-05-16 MED ORDER — FENTANYL CITRATE (PF) 100 MCG/2ML IJ SOLN
INTRAMUSCULAR | Status: AC
  Filled 2022-05-16: qty 2

## 2022-05-16 MED ORDER — ACETAMINOPHEN 325 MG PO TABS
650.0000 mg | ORAL_TABLET | ORAL | Status: DC | PRN
  Filled 2022-05-16 (×2): qty 2

## 2022-05-16 MED ORDER — LEVOTHYROXINE SODIUM 50 MCG PO TABS
50.0000 ug | ORAL_TABLET | Freq: Every day | ORAL | Status: DC
  Administered 2022-05-16 – 2022-05-23 (×7): 50 ug via ORAL
  Filled 2022-05-16 (×6): qty 1

## 2022-05-16 MED ORDER — HYDROCODONE-ACETAMINOPHEN 5-325 MG PO TABS: 1.0000 | ORAL_TABLET | ORAL | Status: DC | PRN

## 2022-05-16 MED ORDER — ASPIRIN 81 MG PO TBEC
DELAYED_RELEASE_TABLET | ORAL | Status: AC
  Filled 2022-05-16: qty 1

## 2022-05-16 SURGICAL SUPPLY — 31 items
BALLN ULTRASCORE 014 3X100X150 (BALLOONS) ×1
BALLN ULTRSCOR 014 2.5X100X150 (BALLOONS) ×1
BALLN ULTRSCOR 014 2.5X200X150 (BALLOONS) ×1
BALLN ULTRVRSE 2X150X150 (BALLOONS) ×1
BALLOON ULTRSC 014 2.5X100X150 (BALLOONS) IMPLANT
BALLOON ULTRSC 014 2.5X200X150 (BALLOONS) IMPLANT
BALLOON ULTRSCRE 014 3X100X150 (BALLOONS) IMPLANT
BALLOON ULTRVRSE 2X150X150 (BALLOONS) IMPLANT
CANNULA 5F STIFF (CANNULA) IMPLANT
CATH 0.018 NAVICROSS ANG 135 (CATHETERS) IMPLANT
CATH ANGIO 5F PIGTAIL 65CM (CATHETERS) IMPLANT
CATH SEEKER .035X135CM (CATHETERS) IMPLANT
COVER PROBE ULTRASOUND 5X96 (MISCELLANEOUS) IMPLANT
DEVICE STARCLOSE SE CLOSURE (Vascular Products) IMPLANT
GLIDEWIRE ADV .035X260CM (WIRE) IMPLANT
GOWN STRL REUS W/ TWL LRG LVL3 (GOWN DISPOSABLE) ×1 IMPLANT
GOWN STRL REUS W/TWL LRG LVL3 (GOWN DISPOSABLE) ×1
GUIDEWIRE PFTE-COATED .018X300 (WIRE) IMPLANT
GUIDEWIRE SUPER STIFF .035X180 (WIRE) IMPLANT
KIT ENCORE 26 ADVANTAGE (KITS) IMPLANT
NDL ENTRY 21GA 7CM ECHOTIP (NEEDLE) IMPLANT
NEEDLE ENTRY 21GA 7CM ECHOTIP (NEEDLE) ×2 IMPLANT
PACK ANGIOGRAPHY (CUSTOM PROCEDURE TRAY) ×1 IMPLANT
SET INTRO CAPELLA COAXIAL (SET/KITS/TRAYS/PACK) IMPLANT
SHEATH BRITE TIP 5FRX11 (SHEATH) IMPLANT
SHEATH RAABE 6FRX70 (SHEATH) IMPLANT
SYR MEDRAD MARK 7 150ML (SYRINGE) IMPLANT
TUBING CONTRAST HIGH PRESS 72 (TUBING) IMPLANT
WIRE GUIDERIGHT .035X150 (WIRE) IMPLANT
WIRE NITINOL .018 (WIRE) IMPLANT
WIRE RUNTHROUGH .014X300CM (WIRE) IMPLANT

## 2022-05-16 NOTE — Consult Note (Signed)
[facility] Vascular Consult Note  MRN : [id number]  Marzetta BoardDavid W Storer is a 66 y.o. (09/23/1955) male who presents with chief complaint of  Chief Complaint  Patient presents with   Foot Pain  .   Consulting Physician:Dr. Lindajo RoyalHazel Duncan MD Reason for consult:Cellulitis and Chronic Osteomyelitis of Left Lower extremity History of Present Illness:   Marzetta BoardDavid W Paynter is a 66 y.o. 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  Patient went to see his podiatrist on 11/27 with acute foot pain after feeling a pop in his foot and x-rays revealed an avulsion fracture of the calcaneus raising the concern for possible acute osteomyelitis (per review of note on 11/27).  Patient is currently on doxycycline for a possible wound infection.  Reports he had a fever of 103 on 11/23 but none since.  Of note he saw vascular surgery on 11/16 with a recommendation for left leg angiography.  According to podiatry, they would like the work-up to be done as an inpatient which includes IV antibiotics, MRI and vascular consult.   ED course and data review: BP 230/83 with otherwise normal vitals.  WBC 11,200 with lactic acid 1.2.  Hemoglobin 12.7.  Glucose 167. MRI was ordered from the ED.  Patient started on vancomycin and cefepime and hospitalist consulted for admission.   Patient seen this morning in the emergency department and was seen sitting upright in stretcher resting comfortably.  Left lower extremity was in a boot.  Boot was removed foot was examined.  Ulcer to his left heel.  Skin was warm to the touch and pink.  I was unable to palpate a pulse DP or PD in his left lower extremity.  I discussed with the patient and his wife who is at the  bedside concerning the need for angiography this morning of his left lower extremity.  Patient states that when he saw Dr. Vilinda FlakeGreg Schnier in clinic on 05/04/2022 he was told that he had poor blood flow to his lower extremity and he may need to have a below-knee amputation.  He states that he believes were at this point right now.  However, I explained in detail the need for an angiography of this lower extremity this morning.  Determining the blood flow to his leg will dictate the plan of action going forward.  The angiography may dictate whether he needs a below the knee amputation or an above-the-knee amputation or may we be able to reestablish blood flow to that lower extremity.  Patient states he has not eaten in over 24 hours.  Patient's BUN and creatinine are adequate for angiography this morning.  Both patient and wife would like to proceed with procedure this morning.  I discussed the risks and the benefits and the outcomes of the procedure this morning with the patient in detail.  Both he and his wife verbalized her understanding and would like to proceed.  Patient will be taken to the specialties lab as soon as possible.  Current Facility-Administered Medications  Medication Dose Route Frequency Provider Last Rate Last Admin   fentaNYL (SUBLIMAZE) 100 MCG/2ML injection            heparin sodium (porcine) 1000 UNIT/ML injection  midazolam (VERSED) 2 MG/2ML injection            0.9 %  sodium chloride infusion   Intravenous Continuous Andris Baumann, MD 75 mL/hr at 05/16/22 0844 New Bag at 05/16/22 0844   [MAR Hold] acetaminophen (TYLENOL) tablet 650 mg  650 mg Oral Q6H PRN Andris Baumann, MD       Or   Mitzi Hansen Hold] acetaminophen (TYLENOL) suppository 650 mg  650 mg Rectal Q6H PRN Andris Baumann, MD       [MAR Hold] ALPRAZolam Prudy Feeler) tablet 0.5 mg  0.5 mg Oral TID PRN Andris Baumann, MD       [MAR Hold] amLODipine (NORVASC) tablet 5 mg  5 mg Oral Daily Lindajo Royal V, MD   5 mg at  05/16/22 0340   [MAR Hold] ceFEPIme (MAXIPIME) 2 g in sodium chloride 0.9 % 100 mL IVPB  2 g Intravenous Q8H Otelia Sergeant, RPH       [MAR Hold] hydrALAZINE (APRESOLINE) injection 10 mg  10 mg Intravenous Q6H PRN Andris Baumann, MD       [MAR Hold] HYDROcodone-acetaminophen (NORCO/VICODIN) 5-325 MG per tablet 1-2 tablet  1-2 tablet Oral Q4H PRN Andris Baumann, MD       [MAR Hold] insulin aspart (novoLOG) injection 0-15 Units  0-15 Units Subcutaneous Q4H Andris Baumann, MD       [MAR Hold] levothyroxine (SYNTHROID) tablet 50 mcg  50 mcg Oral Q0600 Andris Baumann, MD   50 mcg at 05/16/22 0626   [facility] Hold] metoprolol succinate (TOPROL-XL) 24 hr tablet 25 mg  25 mg Oral Daily Andris Baumann, MD       [MAR Hold] morphine (PF) 2 MG/ML injection 2 mg  2 mg Intravenous Q2H PRN Andris Baumann, MD       [MAR Hold] ondansetron [facility]) tablet 4 mg  4 mg Oral Q6H PRN Andris Baumann, MD       Or   Mitzi Hansen Hold] ondansetron [facility]) injection 4 mg  4 mg Intravenous Q6H PRN Andris Baumann, MD       [MAR Hold] vancomycin (VANCOREADY) IVPB 2000 mg/400 mL  2,000 mg Intravenous Q24H Otelia Sergeant, [facility]        Past Medical History:  Diagnosis Date   Diabetes mellitus    Hematospermia    Hypertension    Nephrolithiasis 02/07/07   Viral meningitis     Past Surgical History:  Procedure Laterality Date   CHOLECYSTECTOMY  12-07-14   laparoscopic, per Dr. Rayburn Ma     Social History Social History   Tobacco Use   Smoking status: Former   Smokeless tobacco: Never   Tobacco comments:    22 yrs ago   Substance Use Topics   Alcohol use: No    Alcohol/week: 0.0 standard drinks of alcohol   Drug use: No    Family History Family History  Problem Relation Age of Onset   Rheum arthritis Mother    Diabetes Father    Heart disease Father    Cholecystitis Sister    Cholecystitis Brother    Diabetes Brother    Diabetes Brother    Parkinson's disease Brother    Heart disease Sister    Diabetes  Sister    Cholecystitis Sister    Diabetes Sister    Arthritis Other    Hypertension Other    Stroke Other    Coronary artery disease Other    Birth defects Neg Hx  No Known Allergies   REVIEW OF SYSTEMS (Negative unless checked)  Constitutional: [] Weight loss  [] Fever  [] Chills Cardiac: [] Chest pain   [] Chest pressure   [] Palpitations   [] Shortness of breath when laying flat   [] Shortness of breath at rest   [] Shortness of breath with exertion. Vascular:  [x] Pain in legs with walking   [x] Pain in legs at rest   [] Pain in legs when laying flat   [] Claudication   [] Pain in feet when walking  [] Pain in feet at rest  [] Pain in feet when laying flat   [] History of DVT   [] Phlebitis   [] Swelling in legs   [] Varicose veins   [x] Non-healing ulcers Pulmonary:   [] Uses home oxygen   [] Productive cough   [] Hemoptysis   [] Wheeze  [] COPD   [] Asthma Neurologic:  [] Dizziness  [] Blackouts   [] Seizures   [] History of stroke   [] History of TIA  [] Aphasia   [] Temporary blindness   [] Dysphagia   [] Weakness or numbness in arms   [x] Weakness or numbness in legs Musculoskeletal:  [] Arthritis   [] Joint swelling   [x] Joint pain   [] Low back pain Hematologic:  [] Easy bruising  [] Easy bleeding   [] Hypercoagulable state   [] Anemic  [] Hepatitis Gastrointestinal:  [] Blood in stool   [] Vomiting blood  [] Gastroesophageal reflux/heartburn   [] Difficulty swallowing. Genitourinary:  [] Chronic kidney disease   [] Difficult urination  [] Frequent urination  [] Burning with urination   [] Blood in urine Skin:  [] Rashes   [x] Ulcers   [] Wounds Psychological:  [x] History of anxiety   []  History of major depression.  Physical Examination  Vitals:   05/16/22 0520 05/16/22 0848 05/16/22 0904 05/16/22 0913  BP: (!) 181/67 (!) 202/82 (!) 184/79   Pulse: 83 (!) 106    Resp: 18 10 13    Temp:  98.1 F (36.7 C)    TempSrc:  Oral    SpO2: 97% 98%  100%  Weight:      Height:       Body mass index is 34.86 kg/m. Gen:  WD/WN,  NAD Head: [place]/AT, No temporalis wasting. Prominent temp pulse not noted. Ear/Nose/Throat: Hearing grossly intact, nares w/o erythema or drainage, oropharynx w/o Erythema/Exudate Eyes: Sclera non-icteric, conjunctiva clear Neck: Trachea midline.  No JVD.  Pulmonary:  Good air movement, respirations not labored, equal bilaterally.  Cardiac: RRR, normal S1, S2. Vascular: Left foot open ulcer to the heel. Vessel Right Left  Radial Palpable Palpable                          PT Palpable Palpable  DP Palpable Palpable   Gastrointestinal: soft, non-tender/non-distended. No guarding/reflex.  Musculoskeletal: M/S 5/5 throughout.  Extremities without ischemic changes.  No deformity or atrophy. No edema. Neurologic: Sensation grossly intact in extremities.  Symmetrical.  Speech is fluent. Motor exam as listed above. Psychiatric: Judgment intact, Mood & affect appropriate for pt's clinical situation. Dermatologic: No rashes or ulcers noted.  No cellulitis or open wounds. Lymph : No Cervical, Axillary, or Inguinal lymphadenopathy.    CBC Lab Results  Component Value Date   WBC 11.2 (H) 05/16/2022   HGB 12.7 (L) 05/16/2022   HCT 38.0 (L) 05/16/2022   MCV 92.0 05/16/2022   PLT 267 05/16/2022    BMET    Component Value Date/Time   NA 135 05/16/2022 0133   K 3.6 05/16/2022 0133   CL 101 05/16/2022 0133   CO2 25 05/16/2022 0133   GLUCOSE 167 (H) 05/16/2022 0133  BUN 33 (H) 05/16/2022 0133   CREATININE 1.13 05/16/2022 0133   CREATININE 1.31 (H) 03/15/2020 1700   CALCIUM 9.0 05/16/2022 0133   GFRNONAA >60 05/16/2022 0133   GFRAA >60 01/10/2020 1107   Estimated Creatinine Clearance: 77.2 mL/min (by C-G formula based on SCr of 1.13 mg/dL).  COAG No results found for: "INR", "PROTIME"  Radiology MR HEEL LEFT WO CONTRAST  Result Date: 05/16/2022 CLINICAL DATA:  Infected heel wound. EXAM: MR OF THE LEFT HEEL WITHOUT CONTRAST TECHNIQUE: Multiplanar, multisequence MR imaging of the  left hindfoot was performed. No intravenous contrast was administered. COMPARISON:  Radiographs 05/15/2022 FINDINGS: There is an open wound located along the posterior aspect of the heel acute extends down close to the bone. There is underlying abnormal T1 and T2 signal intensity in the posterior aspect of the calcaneus consistent with osteomyelitis. The tibiotalar, subtalar and midfoot joint spaces are maintained. No findings suspicious for septic arthritis and no other sites of osteomyelitis. There is diffuse cellulitis and myofasciitis without definite findings for pyomyositis. The Achilles tendon is ruptured from its distal attachment site and retracted approximately 2.5 cm. This could be pathologic and related to septic tendinopathy. The medial and lateral ankle ligaments and tendons are intact. Plantar fascia is intact. IMPRESSION: 1. 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 2. Diffuse cellulitis and myofasciitis without definite findings for focal subcutaneous abscess or pyomyositis. 3. Ruptured Achilles tendon from its distal attachment site and retracted approximately 2.5 cm. This is likely pathologic and related to septic tendinopathy. Electronically Signed   By: Rudie Meyer M.D.   On: 05/16/2022 07:36   DG Foot Complete Left  Result Date: 05/15/2022 Please see detailed radiograph report in office note.  VAS [place] ABI WITH/WO TBI  Result Date: 05/08/2022  LOWER EXTREMITY DOPPLER STUDY Patient Name:  KADAR CHANCE  Date of Exam:   05/04/2022 Medical Rec #: [id number]     Accession #:    [id number] Date of Birth: Jan 01, 1956      Patient Gender: M Patient Age:   28 years Exam Location:  [facility] Procedure:      VAS [place] ABI WITH/WO TBI Referring Phys: Festus Barren --------------------------------------------------------------------------------  Indications: Ulceration. left heel x 10 months  Comparison  Study: 08/18/2021 Performing Technologist: Salvadore Farber RVT  Examination Guidelines: A complete evaluation includes at minimum, Doppler waveform signals and systolic blood pressure reading at the level of bilateral brachial, anterior tibial, and posterior tibial arteries, when vessel segments are accessible. Bilateral testing is considered an integral part of a complete examination. Photoelectric Plethysmograph (PPG) waveforms and toe systolic pressure readings are included as required and additional duplex testing as needed. Limited examinations for reoccurring indications may be performed as noted.  ABI Findings: +---------+------------------+-----+----------+--------+ Right    Rt Pressure (mmHg)IndexWaveform  Comment  +---------+------------------+-----+----------+--------+ Brachial 184                                       +---------+------------------+-----+----------+--------+ ATA      176               0.94 monophasic         +---------+------------------+-----+----------+--------+ PTA      188               1.01 biphasic           +---------+------------------+-----+----------+--------+  Great Toe132               0.71 Normal             +---------+------------------+-----+----------+--------+ +---------+------------------+-----+--------+-------+ Left     Lt Pressure (mmHg)IndexWaveformComment +---------+------------------+-----+--------+-------+ Brachial 187                                    +---------+------------------+-----+--------+-------+ ATA      183               0.98 biphasic        +---------+------------------+-----+--------+-------+ PTA      182               0.97 biphasic        +---------+------------------+-----+--------+-------+ Great Toe101               0.54 Abnormal        +---------+------------------+-----+--------+-------+ +-------+-----------+-----------+------------+------------+ ABI/TBIToday's ABIToday's TBIPrevious  ABIPrevious TBI +-------+-----------+-----------+------------+------------+ Right  1.01       .71        1.05        .50          +-------+-----------+-----------+------------+------------+ Left   .98        .54        .98         .51          +-------+-----------+-----------+------------+------------+ Right TBIs appear increased compared to prior study on 08/2021. Left ABIs and TBIs appear essentially unchanged.  Summary: Right: Resting right ankle-brachial index is within normal range. The right toe-brachial index is normal. Left: Resting left ankle-brachial index is within normal range. The left toe-brachial index is abnormal. *See table(s) above for measurements and observations.  Electronically signed by Levora Dredge MD on 05/08/2022 at 4:58:27 PM.    Final       Assessment/Plan 1.  Patient and wife have agreed to angiography of the left lower extremity this morning to determine future plan of care regarding his left lower extremity.  Patient and wife both verbalized her understanding that the patient may need a below the knee amputation or an above-the-knee amputation if vascularization cannot be regained upon angiography this morning.  Procedure was discussed in detail with the patient this morning.  Risks benefits and complications were discussed as well.  Both wife and patient understand and verbalize her understanding.  Plan of care discussed with Dr. Levora Dredge and he is in agreement with plan noted above.   Family Communication:  Total Time: [place] 5 I spent 75 minutes in this encounter including personally reviewing extensive medical records, personally reviewing imaging studies and compared to prior scans, counseling the patient, placing orders, coordinating care and performing appropriate documentation  Thank you for allowing [place] to participate in the care of this patient.   Marcie Bal, NP [facility] [phone] (Office  Phone) [phone] (Office Fax) [phone] (Pager)  05/16/2022 9:15 AM  Staff may message me via secure chat in Epic  but this may not receive immediate response,  please page for urgent matters!  Dictation software was used to generate the above note. Typos may occur and escape review, as with typed/written notes. Any error is purely unintentional.  Please contact me directly for clarity if needed.

## 2022-05-16 NOTE — ED Provider Notes (Signed)
[facility] Provider Note    Event Date/Time   First MD Initiated Contact with Patient 05/16/22 0151     (approximate)   History   Foot Pain   HPI  [name] is a 66 y.o. male with history of hypertension, diabetes, peripheral vascular disease with a chronic nonhealing wound to the left heel that was sent here by his podiatrist for admission and IV antibiotics.  States for the past week he has been on doxycycline.  Last fever was on Thanksgiving and it was 103.  States he feels like the wound is getting bigger and there is redness going up his leg.  He had an x-ray with Dr. Lynnell Catalan which was concerning for possible "open fracture" per Dr. Maxie Barb note.  They recommended admission for broad-spectrum antibiotics, MRI to evaluate for osteomyelitis and vascular surgery consultation.  Patient states he was told that he would likely need a BKA.  He is currently in a cam walker.  Patient states that he is also concerned that he may have ruptured his Achilles as he felt a pop in the back of his leg today.   History provided by patient and wife.    Past Medical History:  Diagnosis Date   Diabetes mellitus    Hematospermia    Hypertension    Nephrolithiasis 02/07/07   Viral meningitis     Past Surgical History:  Procedure Laterality Date   CHOLECYSTECTOMY  12-07-14   laparoscopic, per Dr. Rush Farmer     MEDICATIONS:  Prior to Admission medications   Medication Sig Start Date End Date Taking? Authorizing Provider  ACCU-CHEK GUIDE test strip Use to test blood sugars twice daily. 05/20/21   Laurey Morale, MD  Accu-Chek Softclix Lancets lancets daily. 12/31/19   [provider]  ALPRAZolam (XANAX) 0.5 MG tablet TAKE 1 TABLET (0.5 MG TOTAL) BY MOUTH 3 (THREE) TIMES DAILY AS NEEDED FOR ANXIETY OR SLEEP. 05/03/22   Laurey Morale, MD  Blood Glucose Monitoring Suppl (ACCU-CHEK GUIDE ME) w/Device KIT USE AS DIRECTED EVERY DAY 12/31/19   [provider]  doxycycline (VIBRA-TABS) 100 MG tablet Take 1 tablet (100 mg total) by mouth 2 (two) times daily. 05/09/22 06/08/22  Felipa Furnace, DPM  furosemide (LASIX) 20 MG tablet TAKE 1 TABLET (20 MG TOTAL) BY MOUTH DAILY AS NEEDED FOR FLUID. 05/03/22   Laurey Morale, MD  insulin isophane & regular human KwikPen (NOVOLIN 70/30 KWIKPEN) (70-30) 100 UNIT/ML KwikPen Inject 25 Units into the skin 2 (two) times daily with a meal. 02/03/22   Laurey Morale, MD  Insulin Pen Needle (BD PEN NEEDLE MICRO U/F) 32G X 6 MM MISC USE AS DIRECTED TO INJECT NOVILIN 05/08/22   Laurey Morale, MD  levothyroxine (SYNTHROID) 50 MCG tablet TAKE 1 TABLET BY MOUTH EVERY DAY 05/03/22   Laurey Morale, MD  metoprolol succinate (TOPROL-XL) 25 MG 24 hr tablet Take 1 tablet (25 mg total) by mouth daily. 08/23/21   Laurey Morale, MD  Multiple Vitamin (MULTIVITAMIN) tablet Take 1 tablet by mouth daily.    [provider]  temazepam (RESTORIL) 30 MG capsule TAKE 1 CAPSULE BY MOUTH AT BEDTIME AS NEEDED FOR SLEEP 03/20/22   Laurey Morale, MD    Physical Exam   Triage Vital Signs: ED Triage Vitals [05/15/22 1911]  Enc Vitals Group     BP (!) 230/83     Pulse Rate 100     Resp 18  Temp 98.7 F (37.1 C)     Temp Source Oral     SpO2 98 %     Weight 229 lb 4.5 oz (104 kg)     Height _0  (1.727 m)     Head Circumference      Peak Flow      Pain Score      Pain Loc      Pain Edu?      Excl. in [place]?     Most recent vital signs: Vitals:   05/16/22 0441 05/16/22 0520  BP: (!) 206/86 (!) 181/67  Pulse: (!) 103 83  Resp:  18  Temp: 98.3 F (36.8 C)   SpO2: 98% 97%    CONSTITUTIONAL: Alert and oriented and responds appropriately to questions. Well-appearing; well-nourished HEAD: Normocephalic, atraumatic EYES: Conjunctivae clear, pupils appear equal, sclera nonicteric ENT: normal nose; moist mucous membranes NECK: Supple, normal ROM CARD: RRR; S1 and S2 appreciated; no murmurs, no clicks, no  rubs, no gallops RESP: Normal chest excursion without splinting or tachypnea; breath sounds clear and equal bilaterally; no wheezes, no rhonchi, no rales, no hypoxia or respiratory distress, speaking full sentences ABD/GI: Normal bowel sounds; non-distended; soft, non-tender, no rebound, no guarding, no peritoneal signs BACK: The back appears normal EXT: Normal ROM in all joints; no deformity noted, no calf tenderness; no cyanosis, compartments soft, extremities warm and well-perfused, chronic appearing ulcer with eschar to the left heel with surrounding redness and warmth especially over the medial aspect of the leg that goes up past the ankle.  There is no large joint effusion.  Patient keeps his foot in dorsiflexion. SKIN: Normal color for age and race; warm; no rash on exposed skin NEURO: Moves all extremities equally, normal speech PSYCH: The patient's mood and manner are appropriate.      LEFT foot   Patient gave verbal permission to utilize photo for medical documentation only. The image was not stored on any personal device.    ED Results / Procedures / Treatments   LABS: (all labs ordered are listed, but only abnormal results are displayed) Labs Reviewed  CBC WITH DIFFERENTIAL/PLATELET - Abnormal; Notable for the following components:      Result Value   WBC 11.2 (*)    RBC 4.13 (*)    Hemoglobin 12.7 (*)    HCT 38.0 (*)    Neutro Abs 8.3 (*)    All other components within normal limits  COMPREHENSIVE METABOLIC PANEL - Abnormal; Notable for the following components:   Glucose, Bld 167 (*)    BUN 33 (*)    Total Protein 8.7 (*)    All other components within normal limits  CBG MONITORING, ED - Abnormal; Notable for the following components:   Glucose-Capillary 134 (*)    All other components within normal limits  CULTURE, BLOOD (ROUTINE X 2)  CULTURE, BLOOD (ROUTINE X 2)  AEROBIC/ANAEROBIC CULTURE W GRAM STAIN (SURGICAL/DEEP WOUND)  LACTIC ACID, PLASMA  LACTIC  ACID, PLASMA  PROCALCITONIN  HIV ANTIBODY (ROUTINE TESTING W REFLEX)     EKG:   RADIOLOGY: My personal review and interpretation of imaging: MRI of the heel concerning for osteomyelitis and avulsed Achilles tendon.  I have personally reviewed all radiology reports.   DG Foot Complete Left  Result Date: 05/15/2022 Please see detailed radiograph report in office note.    PROCEDURES:  Critical Care performed: Yes, see critical care procedure note(s)   CRITICAL CARE Performed by: Pryor Curia   Total critical  care time: 45 minutes  Critical care time was exclusive of separately billable procedures and treating other patients.  Critical care was necessary to treat or prevent imminent or life-threatening deterioration.  Critical care was time spent personally by me on the following activities: development of treatment plan with patient and/or surrogate as well as nursing, discussions with consultants, evaluation of patient's response to treatment, examination of patient, obtaining history from patient or surrogate, ordering and performing treatments and interventions, ordering and review of laboratory studies, ordering and review of radiographic studies, pulse oximetry and re-evaluation of patient's condition.   Procedures    IMPRESSION / MDM / ASSESSMENT AND PLAN / ED COURSE  I reviewed the triage vital signs and the nursing notes.    Patient here with nonhealing diabetic foot ulcer and cellulitis.  The patient is on the cardiac monitor to evaluate for evidence of arrhythmia and/or significant heart rate changes.   DIFFERENTIAL DIAGNOSIS (includes but not limited to):   Cellulitis, osteomyelitis, abscess, Achilles rupture   Patient's presentation is most consistent with acute presentation with potential threat to life or bodily function.   PLAN: Workup initiated from triage.  Slight leukocytosis of 11.2.  Normal electrolytes and glucose.  Will add on lactic, wound  cultures, blood cultures, procalcitonin.  Will give broad-spectrum antibiotics per podiatry recommendations and obtain MRI of the left heel.  He denies any pain at this time.  No indication for emergent vascular surgery consultation.  They can be consulted not emergently in the morning.   MEDICATIONS GIVEN IN ED: Medications  metoprolol succinate (TOPROL-XL) 24 hr tablet 25 mg (has no administration in time range)  ALPRAZolam (XANAX) tablet 0.5 mg (has no administration in time range)  levothyroxine (SYNTHROID) tablet 50 mcg (50 mcg Oral Given 05/16/22 0625)  hydrALAZINE (APRESOLINE) injection 10 mg (has no administration in time range)  amLODipine (NORVASC) tablet 5 mg (5 mg Oral Given 05/16/22 0340)  acetaminophen (TYLENOL) tablet 650 mg (has no administration in time range)    Or  acetaminophen (TYLENOL) suppository 650 mg (has no administration in time range)  ondansetron (ZOFRAN) tablet 4 mg (has no administration in time range)    Or  ondansetron (ZOFRAN) injection 4 mg (has no administration in time range)  insulin aspart (novoLOG) injection 0-15 Units ( Subcutaneous Not Given 05/16/22 0414)  0.9 %  sodium chloride infusion (75 mL/hr Intravenous New Bag/Given 05/16/22 0306)  HYDROcodone-acetaminophen (NORCO/VICODIN) 5-325 MG per tablet 1-2 tablet (has no administration in time range)  morphine (PF) 2 MG/ML injection 2 mg (has no administration in time range)  ceFEPIme (MAXIPIME) 2 g in sodium chloride 0.9 % 100 mL IVPB (has no administration in time range)  vancomycin (VANCOREADY) IVPB 2000 mg/400 mL (has no administration in time range)  vancomycin (VANCOREADY) IVPB 2000 mg/400 mL (0 mg Intravenous Stopped 05/16/22 0545)  ceFEPIme (MAXIPIME) 2 g in sodium chloride 0.9 % 100 mL IVPB (0 g Intravenous Stopped 05/16/22 0340)     ED COURSE: Lactic is normal.  Procalcitonin 0.14.  MRI reviewed and interpreted by myself and Dr. Nevada Crane with radiology.  He states patient has significant  osteomyelitis and appears to have avulsion of his Achilles tendon completely off of the heel due to this osteomyelitis.  Will place him back in his cam walker and dress his wound.  Will discuss with the hospitalist for admission.   CONSULTS:  Consulted and discussed patient's case with hospitalist, Dr. Damita Dunnings.  I have recommended admission and consulting physician agrees and  will place admission orders.  Patient (and family if present) agree with this plan.   I reviewed all nursing notes, vitals, pertinent previous records.  All labs, EKGs, imaging ordered have been independently reviewed and interpreted by myself.    OUTSIDE RECORDS REVIEWED: Reviewed patient's podiatry note today.       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   Yaretsi Humphres, Delice Bison, DO 05/16/22 [id number]

## 2022-05-16 NOTE — Assessment & Plan Note (Addendum)
BP 230/83 on arrival - now resolved. Continue home metoprolol, Amlodipine & Hydralazine IV as needed

## 2022-05-16 NOTE — Consult Note (Signed)
PODIATRY CONSULTATION  NAME [name] MRN [id number] DOB 1956-03-03 DOA 05/16/2022   Reason for consult: Decubitus heel ulcer. Calc osteo Chief Complaint  Patient presents with   Foot Pain    Consulting physician: Lindajo Royal MD, [facility]  History of present illness: 66 y.o. male PMHx insulin-dependent T2DM, PVD, acute on chronic osteomyelitis of the calcaneus with decubitus heel ulcer admitted for worsening infection left heel.  Seen outpatient in the office 05/15/2022 and recommended going to the emergency department for admission and workup.  Patient admitted today and went for angio with vascular surgery, Dr. Gilda Crease.  Successful two-vessel runoff to the foot.  PT occluded. Patient resting comfortably at bedside.  Past Medical History:  Diagnosis Date   Diabetes mellitus    Hematospermia    Hypertension    Nephrolithiasis 02/07/07   Viral meningitis        Latest Ref Rng & Units 05/16/2022    1:33 AM 08/23/2021   10:30 AM 12/08/2020   10:25 AM  CBC  WBC 4.0 - 10.5 K/uL 11.2  8.7  7.9   Hemoglobin 13.0 - 17.0 g/dL 02.5  85.2  77.8   Hematocrit 39.0 - 52.0 % 38.0  39.4  39.0   Platelets 150 - 400 K/uL 267  165.0  147.0        Latest Ref Rng & Units 05/16/2022    1:33 AM 08/23/2021   10:30 AM 12/08/2020   10:25 AM  BMP  Glucose 70 - 99 mg/dL 242  353  63   BUN 8 - 23 mg/dL 33  25  21   Creatinine 0.61 - 1.24 mg/dL 6.14  4.31  5.40   Sodium 135 - 145 mmol/L 135  141  138   Potassium 3.5 - 5.1 mmol/L 3.6  4.8  4.2   Chloride 98 - 111 mmol/L 101  104  102   CO2 22 - 32 mmol/L 25  30  29    Calcium 8.9 - 10.3 mg/dL 9.0  9.4  9.5       Physical Exam: General: The patient is alert and oriented x3 in no acute distress.   Dermatology: Decubitus heel ulcer with eschar.  Vascular: Erythema with edema throughout the foot and ankle. Angioplasty LLE 05/16/2022: Findings:  The abdominal aorta is opacified with a bolus injection contrast. Renal arteries are  single and widely patent. The aorta itself has diffuse disease but no hemodynamically significant lesions. The common and external iliac arteries are widely patent bilaterally.   The left common femoral is widely patent as is the profunda femoris.  The SFA and popliteal artery demonstrate diffuse disease but there are no hemodynamically significant lesions.  The trifurcation is diseased with occlusion of the anterior tibial over a distance of approximately 8 to 10 cm associated with a greater than 90% distal lesion, peroneal demonstrates a greater than 90% lesion in its proximal portion over a length of 40 to 60 mm and posterior tibial artery is occluded throughout its entire length.      Following angioplasty the anterior tibial now is now widely patent and demonstrates in-line flow with less than 20% residual stenosis.  It is much improved and looks quite nice. Angioplasty of the peroneal also yields an excellent result with less than 20% residual stenosis.   Summary: Successful recanalization left lower extremity for limb salvage with two-vessel runoff after intervention.  Neurological: Diminished via light touch  Musculoskeletal Exam: No structural deformity noted.  No prior amputations.  MR HEEL LT WO CONTRAST 05/16/2022 IMPRESSION: 1. 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 2. Diffuse cellulitis and myofasciitis without definite findings for focal subcutaneous abscess or pyomyositis. 3. Ruptured Achilles tendon from its distal attachment site and retracted approximately 2.5 cm. This is likely pathologic and related to septic tendinopathy.    ASSESSMENT/PLAN OF CARE Decubitus heel ulcer w/ underlying calcaneal osteomyelitis -Successful angio with two-vessel runoff.  Unfortunately PT is occluded which provides flow to the medial portion of the calcaneus -This evening we discussed limb salvage versus  amputation.  Patient understands that he is high risk for amputation however he would like to pursue limb salvage for the moment.  There is concern for functionality and potential for healing with compromised Achilles and osteo of the calcaneus which will likely lead to a nonfunctional limb if healing is successful -Will plan to discuss with vascular and get their thoughts on arterial supply and recommendations of salvage versus amputation -If salvage is recommended and we pursue this route, we will plan for Thursday, 05/18/2022.  Surgery will likely include excisional debridement of decubitus heel ulcer, placement of antibiotic beads with partial calcanectomy, and application of wound VAC -Podiatry will continue to follow    Thank you for the consult.  Please contact me directly via secure chat with any questions or concerns.     Felecia Shelling, DPM [facility]  Dr. Felecia Shelling, DPM    [address] [place], [place] [address]                Office [phone]  Fax [phone]

## 2022-05-16 NOTE — Care Plan (Signed)
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  Patient has seen podiatrist on 05/15/22 with acute foot pain and x-rays revealed an avulsion fracture of calcaneum raising the concern for possible acute osteomyelitis.  Patient was started on doxycycline for possible wound infection. Patient has also seen vascular surgery on 11/16 with recommendation for left leg angiography.  Patient is admitted for further evaluation.  Vascular surgery consulted , Patient underwent successful recanalization left lower extremity for limb salvage with two-vessel runoff.  Patient tolerated procedure well.  Patient was seen and examined at bedside.  All questions answered.

## 2022-05-16 NOTE — Assessment & Plan Note (Signed)
Does not appear to be on antiplatelets or statins, pending med reconciliation

## 2022-05-16 NOTE — Op Note (Signed)
[facility]  Percutaneous Study/Intervention Procedural Note   Date of Surgery: 05/16/2022  Surgeon:  Hortencia Pilar  Pre-operative Diagnosis: Atherosclerotic occlusive disease bilateral lower extremities with nonhealing ulceration left lower extremity  Post-operative diagnosis:  Same  Procedure(s) Performed:             1.  Introduction catheter into left lower extremity 3rd order catheter placement               2.  Contrast injection left lower extremity for distal runoff with additional 3rd order             3.  Percutaneous transluminal angioplasty left peroneal to 3 mm                          4.  Percutaneous transluminal angioplasty left anterior tibial to 2.5 mm             5.  Star close closure right common femoral arteriotomy  Anesthesia: Conscious sedation was administered under my direct supervision by the interventional radiology RN. IV Versed plus fentanyl were utilized. Continuous ECG, pulse oximetry and blood pressure was monitored throughout the entire procedure.  Conscious sedation was for a total of 1 hour 17 minutes.  Sheath: 6 [place] Rabie right common femoral retrograde  Contrast: 75 cc  Fluoroscopy Time: 14.0 minutes  Indications:  [name] presents with increasing pain of the left lower extremity.  He has a nonhealing wound and is being admitted secondary to cellulitis.  This suggests the patient is having limb threatening ischemia. The risks and benefits are reviewed all questions answered patient agrees to proceed.  Procedure: [name] is a 66 y.o. y.o. male who was identified and appropriate procedural time out was performed.  The patient was then placed supine on the table and prepped and draped in the usual sterile fashion.    Ultrasound was placed in the sterile sleeve and the right groin was evaluated the right common femoral artery was echolucent and pulsatile indicating patency.  Image was recorded for the permanent  record and under real-time visualization a microneedle was inserted into the common femoral artery followed by the microwire and then the micro-sheath.  A J-wire was then advanced through the micro-sheath and a  5 [place] sheath was then inserted over a J-wire. J-wire was then advanced and a 5 French pigtail catheter was positioned at the level of T12.  AP projection of the aorta was then obtained. Pigtail catheter was repositioned to above the bifurcation and a RAO view of the pelvis was obtained.  Subsequently a pigtail catheter with the stiff angle Glidewire was used to cross the aortic bifurcation the catheter wire were advanced down into the left distal external iliac artery. Oblique view of the femoral bifurcation was then obtained and subsequently the wire was reintroduced and the pigtail catheter negotiated into the SFA representing third order catheter placement. Distal runoff was then performed.  6000 units of heparin was then given and allowed to circulate and a 6 [place] Rabie sheath was advanced up and over the bifurcation and positioned in the femoral artery  Seeker catheter and advantage Glidewire were then negotiated down into the distal popliteal.  The wire is then negotiated through the peroneal lesion and the catheter was then advanced. Hand injection contrast demonstrated the distal peroneal anatomy in detail.  A 0.014 wire was then advanced and the catheter removed.  A 2.5 mm  x 100 mm ultra score balloon was used to angioplasty the peroneal.  The inflation was to 10 atm for 1 minute. Follow-up imaging demonstrated patency with greater than 50% residual stenosis and therefore a 3.0 mm x 100 mm ultra score balloon was advanced across the same area inflated to 10 atm for 1 minute.  Follow-up imaging demonstrated rapid flow of contrast with less than 20% residual stenosis.  The detector was then repositioned and the distal popliteal was re-imaged.   Occlusion of the anterior tibial is again  identified with a greater than 80% distal stenosis.  Using a combination of a 0.018 advantage wire and a Nava cross catheter the occlusion was crossed hand-injection of contrast demonstrated intraluminal positioning in the distal anterior tibial.  A 0.014 wire was advanced under fluoroscopic guidance.  A 2 mm x 150 mm Ultraverse balloon was then advanced across the lesion and inflated to 10 atm.  Next a 2.5 mm x 200 mm ultra score balloon was advanced across the lesion and inflated to 10 atm for 1 minute.  Follow-up imaging demonstrated patency with excellent result and less than 20% residual stenosis. Distal runoff was then reassessed and noted to be widely patent.    After review of these images the sheath is pulled into the right external iliac oblique of the common femoral is obtained and a Star close device deployed. There no immediate Complications.  Findings:  The abdominal aorta is opacified with a bolus injection contrast. Renal arteries are single and widely patent. The aorta itself has diffuse disease but no hemodynamically significant lesions. The common and external iliac arteries are widely patent bilaterally.  The left common femoral is widely patent as is the profunda femoris.  The SFA and popliteal artery demonstrate diffuse disease but there are no hemodynamically significant lesions.  The trifurcation is diseased with occlusion of the anterior tibial over a distance of approximately 8 to 10 cm associated with a greater than 90% distal lesion, peroneal demonstrates a greater than 90% lesion in its proximal portion over a length of 40 to 60 mm and posterior tibial artery is occluded throughout its entire length.     Following angioplasty the anterior tibial now is now widely patent and demonstrates in-line flow with less than 20% residual stenosis.  It is much improved and looks quite nice. Angioplasty of the peroneal also yields an excellent result with less than 20% residual  stenosis.  Summary: Successful recanalization left lower extremity for limb salvage with two-vessel runoff after intervention.                           Disposition: Patient was taken to the recovery room in stable condition having tolerated the procedure well.  [name] 05/16/2022,10:46 AM

## 2022-05-16 NOTE — ED Notes (Signed)
Glucose level tested: 134mg /dl.  Pt to have 2 units of insulin.. refuses stating that it will drop as he hasn't eaten since lunch yesterday.

## 2022-05-16 NOTE — ED Notes (Signed)
Report given to Spec[facility] at this time.

## 2022-05-16 NOTE — Assessment & Plan Note (Addendum)
Sliding scale insulin coverage for now. A1c 6.6 (4 months ago) 

## 2022-05-16 NOTE — Assessment & Plan Note (Signed)
Continue levothyroxine 

## 2022-05-16 NOTE — Assessment & Plan Note (Addendum)
Acute on Chronic osteomyelitis with draining sinus of LLE Avulsion fracture of left calcaneus  MRI left foot confirmed osteomyelitis involving the posterior aspect of the calcaneous. 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

## 2022-05-16 NOTE — Assessment & Plan Note (Deleted)
Cellulitis left lower extremity Avulsion fracture left calcaneus

## 2022-05-16 NOTE — Progress Notes (Signed)
Pharmacy Antibiotic Note  [name] is a 66 y.o. male admitted on 05/16/2022 with cellulitis.  Pharmacy has been consulted for Cefepime & Vancomycin dosing for 7 days.  Plan: Cefepime 2 gm q8hr per indication & renal fxn.  Pt given Vancomycin 2000 mg once. Vancomycin 2000 mg IV Q 24 hrs. Goal AUC 400-550. Expected AUC: 465.1 SCr used: 1.13  Pharmacy will continue to follow and will adjust abx dosing whenever warranted.  Temp (24hrs), Avg:98.6 F (37 C), Min:98.5 F (36.9 C), Max:98.7 F (37.1 C)   Recent Labs  Lab 05/16/22 0133  WBC 11.2*  CREATININE 1.13  LATICACIDVEN 1.2    Estimated Creatinine Clearance: 77.2 mL/min (by C-G formula based on SCr of 1.13 mg/dL).    No Known Allergies  Antimicrobials this admission: 11/28 Cefepime >> x 7 days 11/28 Vancomycin >> x 7 days  Microbiology results: 11/28 BCx: Pending 11/28 WoundCx: Pending  Thank you for allowing pharmacy to be a part of this patient's care.  Otelia Sergeant, PharmD, [facility] 05/16/2022 3:09 AM

## 2022-05-16 NOTE — Interval H&P Note (Signed)
History and Physical Interval Note:  05/16/2022 9:13 AM  [name]  has presented today for surgery, with the diagnosis of leg pain.  The various methods of treatment have been discussed with the patient and family. After consideration of risks, benefits and other options for treatment, the patient has consented to  Procedure(s): Lower Extremity Angiography (Left) as a surgical intervention.  The patient's history has been reviewed, patient examined, no change in status, stable for surgery.  I have reviewed the patient's chart and labs.  Questions were answered to the patient's satisfaction.     Levora Dredge

## 2022-05-16 NOTE — Progress Notes (Signed)
PHARMACY -  BRIEF ANTIBIOTIC NOTE   Pharmacy has received consult(s) for Cefepime & Vancomycin from an ED provider.  The patient's profile has been reviewed for ht/wt/allergies/indication/available labs.    One time order(s) placed for Cefepime 2 gm & Vancomycin 2 gm per pt wt > 1000 kg.  Further antibiotics/pharmacy consults should be ordered by admitting physician if indicated.                       Thank you, Otelia Sergeant, PharmD, [facility] 05/16/2022 2:26 AM

## 2022-05-16 NOTE — H&P (Signed)
History and Physical    Patient: [name] DDU:[id number] DOB: Nov 06, 1955 DOA: 05/16/2022 DOS: the patient was seen and examined on 05/16/2022 PCP: Laurey Morale, MD  Patient coming from: Home  Chief Complaint:  Chief Complaint  Patient presents with   Foot Pain    HPI: [name] is a 66 y.o. 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  Patient went to see his podiatrist on 11/27 with acute foot pain after feeling a pop in his foot and x-rays revealed an avulsion fracture of the calcaneus raising the concern for possible acute osteomyelitis (per review of note on 11/27).  Patient is currently on doxycycline for a possible wound infection.  Reports he had a fever of 103 on 11/23 but none since.  Of note he saw vascular surgery on 11/16 with a recommendation for left leg angiography.  According to podiatry, they would like the work-up to be done as an inpatient which includes IV antibiotics, MRI and vascular consult.   ED course and data review: BP 230/83 with otherwise normal vitals.  WBC 11,200 with lactic acid 1.2.  Hemoglobin 12.7.  Glucose 167. MRI was ordered from the ED.  Patient started on vancomycin and cefepime and hospitalist consulted for admission.   Review of Systems: As mentioned in the history of present illness. All other systems reviewed and are negative.  Past Medical History:  Diagnosis Date   Diabetes mellitus    Hematospermia    Hypertension    Nephrolithiasis 02/07/07   Viral meningitis    Past Surgical History:  Procedure Laterality Date   CHOLECYSTECTOMY  12-07-14   laparoscopic, per Dr. Rush Farmer    Social History:  reports that he has quit smoking. He has never used smokeless tobacco. He  reports that he does not drink alcohol and does not use drugs.  No Known Allergies  Family History  Problem Relation Age of Onset   Rheum arthritis Mother    Diabetes Father    Heart disease Father    Cholecystitis Sister    Cholecystitis Brother    Diabetes Brother    Diabetes Brother    Parkinson's disease Brother    Heart disease Sister    Diabetes Sister    Cholecystitis Sister    Diabetes Sister    Arthritis Other    Hypertension Other    Stroke Other    Coronary artery disease Other    Birth defects Neg Hx     Prior to Admission medications   Medication Sig Start Date End Date Taking? Authorizing Provider  SANTYL 250 UNIT/GM ointment Apply 1 Application topically daily. 04/27/22  Yes [provider]  ACCU-CHEK GUIDE test strip Use to test blood sugars twice daily. 05/20/21   Laurey Morale, MD  Accu-Chek Softclix Lancets lancets daily. 12/31/19   [provider]  ALPRAZolam (XANAX) 0.5 MG tablet TAKE 1 TABLET (0.5 MG TOTAL) BY MOUTH 3 (THREE) TIMES DAILY AS NEEDED FOR ANXIETY OR SLEEP. 05/03/22   Laurey Morale, MD  Blood Glucose Monitoring Suppl (ACCU-CHEK GUIDE ME) w/Device KIT USE AS DIRECTED EVERY DAY 12/31/19   [provider]  doxycycline (VIBRA-TABS) 100 MG tablet Take 1 tablet (100 mg total) by mouth 2 (two) times daily. 05/09/22 06/08/22  Boneta Lucks  P, DPM  furosemide (LASIX) 20 MG tablet TAKE 1 TABLET (20 MG TOTAL) BY MOUTH DAILY AS NEEDED FOR FLUID. 05/03/22   Laurey Morale, MD  insulin isophane & regular human KwikPen (NOVOLIN 70/30 KWIKPEN) (70-30) 100 UNIT/ML KwikPen Inject 25 Units into the skin 2 (two) times daily with a meal. 02/03/22   Laurey Morale, MD  Insulin Pen Needle (BD PEN NEEDLE MICRO U/F) 32G X 6 MM MISC USE AS DIRECTED TO INJECT NOVILIN 05/08/22   Laurey Morale, MD  levothyroxine (SYNTHROID) 50 MCG tablet TAKE 1 TABLET BY MOUTH EVERY DAY 05/03/22   Laurey Morale, MD  metoprolol succinate (TOPROL-XL) 25 MG 24 hr tablet  Take 1 tablet (25 mg total) by mouth daily. 08/23/21   Laurey Morale, MD  Multiple Vitamin (MULTIVITAMIN) tablet Take 1 tablet by mouth daily.    [provider]  temazepam (RESTORIL) 30 MG capsule TAKE 1 CAPSULE BY MOUTH AT BEDTIME AS NEEDED FOR SLEEP 03/20/22   Laurey Morale, MD    Physical Exam: Vitals:   05/15/22 1911 05/16/22 0147  BP: (!) 230/83 (!) 193/68  Pulse: 100 93  Resp: 18 20  Temp: 98.7 F (37.1 C) 98.5 F (36.9 C)  TempSrc: Oral Oral  SpO2: 98% 97%  Weight: 104 kg   Height: _0  (1.727 m)    Physical Exam Vitals and nursing note reviewed.  Constitutional:      General: He is not in acute distress. HENT:     Head: Normocephalic and atraumatic.  Cardiovascular:     Rate and Rhythm: Normal rate and regular rhythm.     Heart sounds: Normal heart sounds.  Pulmonary:     Effort: Pulmonary effort is normal.     Breath sounds: Normal breath sounds.  Abdominal:     Palpations: Abdomen is soft.     Tenderness: There is no abdominal tenderness.  Musculoskeletal:     Comments: See pic below  Neurological:     Mental Status: Mental status is at baseline.     Labs on Admission: I have personally reviewed following labs and imaging studies  CBC: Recent Labs  Lab 05/16/22 0133  WBC 11.2*  NEUTROABS 8.3*  HGB 12.7*  HCT 38.0*  MCV 92.0  PLT 287   Basic Metabolic Panel: Recent Labs  Lab 05/16/22 0133  NA 135  K 3.6  CL 101  CO2 25  GLUCOSE 167*  BUN 33*  CREATININE 1.13  CALCIUM 9.0   GFR: Estimated Creatinine Clearance: 77.2 mL/min (by C-G formula based on SCr of 1.13 mg/dL). Liver Function Tests: Recent Labs  Lab 05/16/22 0133  AST 22  ALT 22  ALKPHOS 120  BILITOT 1.1  PROT 8.7*  ALBUMIN 3.7   No results for input(s): "LIPASE", "AMYLASE" in the last 168 hours. No results for input(s): "AMMONIA" in the last 168 hours. Coagulation Profile: No results for input(s): "INR", "PROTIME" in the last 168 hours. Cardiac Enzymes: No  results for input(s): "CKTOTAL", "CKMB", "CKMBINDEX", "TROPONINI" in the last 168 hours. BNP (last 3 results) No results for input(s): "PROBNP" in the last 8760 hours. HbA1C: No results for input(s): "HGBA1C" in the last 72 hours. CBG: No results for input(s): "GLUCAP" in the last 168 hours. Lipid Profile: No results for input(s): "CHOL", "HDL", "LDLCALC", "TRIG", "CHOLHDL", "LDLDIRECT" in the last 72 hours. Thyroid Function Tests: No results for input(s): "TSH", "T4TOTAL", "FREET4", "T3FREE", "THYROIDAB" in the last 72 hours. Anemia Panel: No results for input(s): "VITAMINB12", "  FOLATE", "FERRITIN", "TIBC", "IRON", "RETICCTPCT" in the last 72 hours. Urine analysis:    Component Value Date/Time   COLORURINE YELLOW 01/10/2020 1942   APPEARANCEUR HAZY (A) 01/10/2020 1942   LABSPEC 1.022 01/10/2020 1942   PHURINE 5.0 01/10/2020 1942   GLUCOSEU >=500 (A) 01/10/2020 1942   HGBUR NEGATIVE 01/10/2020 1942   HGBUR negative 07/21/2008 0924   BILIRUBINUR NEGATIVE 01/10/2020 1942   BILIRUBINUR 1+ 11/12/2013 0945   KETONESUR NEGATIVE 01/10/2020 1942   PROTEINUR NEGATIVE 01/10/2020 1942   UROBILINOGEN 1.0 11/12/2013 0945   UROBILINOGEN 0.2 07/21/2008 0924   NITRITE NEGATIVE 01/10/2020 1942   LEUKOCYTESUR NEGATIVE 01/10/2020 1942    Radiological Exams on Admission: DG Foot Complete Left  Result Date: 05/15/2022 Please see detailed radiograph report in office note.    Data Reviewed: Relevant notes from primary care and specialist visits, past discharge summaries as available in EHR, including Care Everywhere. Prior diagnostic testing as pertinent to current admission diagnoses Updated medications and problem lists for reconciliation ED course, including vitals, labs, imaging, treatment and response to treatment Triage notes, nursing and pharmacy notes and ED provider's notes Notable results as noted in HPI   Assessment and Plan: * Cellulitis of left lower extremity Chronic  osteomyelitis with draining sinus, with possible acute osteomyelitis Follow-up MRI left foot to evaluate for acute osteomyelitis Rocephin and vancomycin Follow wound cultures Podiatry consult Vascular consult as requested by podiatry -last seen 11/16 with plans for angiography We will keep n.p.o. tonight in case of procedure  Avulsion fracture of left calcaneus Patient was placed in a boot by podiatry on 11/27 Pain control  Hypertensive urgency BP 230/83 on arrival Continue home metoprolol Amlodipine 5 mg daily  Hydralazine IV as needed  Atherosclerosis of native arteries of the extremities with ulceration ([place]) Does not appear to be on antiplatelets or statins, pending med reconciliation  Hypothyroidism Continue levothyroxine  Type 2 diabetes mellitus with diabetic neuropathy, unspecified (HCC) Sliding scale insulin coverage while n.p.o.     DVT prophylaxis: SCD  Consults: Podiatry, Dr. Guy Sandifer and vascular, Dr. Lucky Cowboy  Advance Care Planning:   Code Status: Not on file   Family Communication: none  Disposition Plan: Back to previous home environment  Severity of Illness: The appropriate patient status for this patient is INPATIENT. Inpatient status is judged to be reasonable and necessary in order to provide the required intensity of service to ensure the patient's safety. The patient's presenting symptoms, physical exam findings, and initial radiographic and laboratory data in the context of their chronic comorbidities is felt to place them at high risk for further clinical deterioration. Furthermore, it is not anticipated that the patient will be medically stable for discharge from the hospital within 2 midnights of admission.   * I certify that at the point of admission it is my clinical judgment that the patient will require inpatient hospital care spanning beyond 2 midnights from the point of admission due to high intensity of service, high risk for further  deterioration and high frequency of surveillance required.*  Author: Athena Masse, MD 05/16/2022 2:42 AM  For on call review [email or web address].

## 2022-05-16 NOTE — Assessment & Plan Note (Addendum)
Patient was placed in a boot by podiatry on 11/27 Continue Pain mgmt

## 2022-05-17 ENCOUNTER — Encounter: Payer: Self-pay | Admitting: Vascular Surgery

## 2022-05-17 ENCOUNTER — Encounter (INDEPENDENT_AMBULATORY_CARE_PROVIDER_SITE_OTHER): Payer: Self-pay | Admitting: Vascular Surgery

## 2022-05-17 DIAGNOSIS — L97429 Non-pressure chronic ulcer of left heel and midfoot with unspecified severity: Secondary | ICD-10-CM

## 2022-05-17 DIAGNOSIS — M86672 Other chronic osteomyelitis, left ankle and foot: Secondary | ICD-10-CM

## 2022-05-17 DIAGNOSIS — M86172 Other acute osteomyelitis, left ankle and foot: Secondary | ICD-10-CM | POA: Diagnosis not present

## 2022-05-17 DIAGNOSIS — L03116 Cellulitis of left lower limb: Secondary | ICD-10-CM | POA: Diagnosis not present

## 2022-05-17 DIAGNOSIS — E08621 Diabetes mellitus due to underlying condition with foot ulcer: Secondary | ICD-10-CM

## 2022-05-17 LAB — GLUCOSE, CAPILLARY
Glucose-Capillary: 129 mg/dL — ABNORMAL HIGH (ref 70–99)
Glucose-Capillary: 135 mg/dL — ABNORMAL HIGH (ref 70–99)
Glucose-Capillary: 144 mg/dL — ABNORMAL HIGH (ref 70–99)
Glucose-Capillary: 149 mg/dL — ABNORMAL HIGH (ref 70–99)
Glucose-Capillary: 152 mg/dL — ABNORMAL HIGH (ref 70–99)
Glucose-Capillary: 157 mg/dL — ABNORMAL HIGH (ref 70–99)
Glucose-Capillary: 217 mg/dL — ABNORMAL HIGH (ref 70–99)

## 2022-05-17 MED ORDER — VANCOMYCIN HCL 750 MG/150ML IV SOLN
750.0000 mg | Freq: Two times a day (BID) | INTRAVENOUS | Status: DC
  Administered 2022-05-17 – 2022-05-18 (×2): 750 mg via INTRAVENOUS
  Filled 2022-05-17 (×3): qty 150

## 2022-05-17 MED ORDER — AMLODIPINE BESYLATE 10 MG PO TABS
10.0000 mg | ORAL_TABLET | Freq: Every day | ORAL | Status: DC
  Administered 2022-05-17 – 2022-05-23 (×6): 10 mg via ORAL
  Filled 2022-05-17 (×6): qty 1

## 2022-05-17 NOTE — Progress Notes (Signed)
Patient called the answering service. He and his wife have discussed and he wants to proceed with amputation. We will keep NPO for tomorrow but will defer to vascular for amputation. Will cancel surgery with [place] for tomorrow.   Ovid Curd, DPM

## 2022-05-17 NOTE — Progress Notes (Signed)
PROGRESS NOTE    [name]  SWF:[id number] DOB: October 06, 1955 DOA: 05/16/2022 PCP: Nelwyn Salisbury, MD   Brief Narrative:  This 66 years old male with PMH significant for hypertension, Insulin-dependent diabetes mellitus type 2 with neuropathy, peripheral vascular disease, hypothyroidism, 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  Patient has seen podiatrist on 05/15/22 with acute foot pain and x-rays revealed an avulsion fracture of calcaneum raising the concern for possible acute osteomyelitis.  Patient was started on doxycycline for possible wound infection. Patient has also seen vascular surgery on 11/16 with recommendation for left leg angiography.  Patient is admitted for further evaluation.  Vascular surgery consulted , Patient underwent successful recanalization left lower extremity for limb salvage with two-vessel runoff.  Patient tolerated procedure well.  Patient is at high risk for amputation however podiatry would like to pursue limb salvage for the moment.  If salvage is recommended would pursue excisional debridement of decubitus heel and placement of antibiotic beads with partial calcanectomy and wound VAC on Thursday.  Assessment & Plan:   Principal Problem:   Cellulitis of left lower extremity Active Problems:   Acute on chronic osteomyelitis left calcaneus (HCC)   Avulsion fracture of left calcaneus   Type 2 diabetes mellitus with diabetic neuropathy, unspecified (HCC)   Hypothyroidism   Atherosclerosis of native arteries of the extremities with ulceration (HCC)   Hypertensive urgency  Cellulitis of left lower extremity: Chronic osteomyelitis with draining sinuses with possible acute osteomyelitis: Patient presented with chronic nonhealing wound for last 10 months completed course of antibiotics. MRI : osteomyelitis involving the posterior  aspect of the calcaneus.  Initiated on empiric Rocephin and vancomycin. Follow-up wound cultures. Podiatry consulted, patient is at high risk for amputation however podiatry would like to pursue limb salvage for the moment. Vascular surgery consulted.  Patient underwent successful with recanalization of left lower extremity for limb salvage with two-vessel runoff,  tolerated well. Patient is tentatively scheduled for OR on Thursday (excisional debridement versus amputation)   Avulsion Fracture of left calcaneum: Patient was placed in the boot by podiatry on 05/15/2022. Continue adequate pain control.   Hypertensive urgency: BP 230/83 on arrival Continue home metoprolol Increase amlodipine to 10 mg daily. Continue Hydralazine IV as needed   Atherosclerosis of native arteries of the extremities with ulceration (HCC) Does not appear to be on antiplatelets or statins, pending med reconciliation   Hypothyroidism: Continue levothyroxine   Type 2 diabetes with diabetic neuropathy: Carb modified diet. Continue regular insulin sliding scale.    DVT prophylaxis: Lovenox Code Status: Full code. Family Communication: Wife at bed side. Disposition Plan:   Admitted for osteomyelitis of left calcaneum, plan is for limb salvage procedure tentatively scheduled on Thursday   Consultants:  Podiatry Vascular surgery  Procedures:  Antimicrobials:  Anti-infectives (From admission, onward)    Start     Dose/Rate Route Frequency Ordered Stop   05/16/22 2200  vancomycin (VANCOREADY) IVPB 2000 mg/400 mL        2,000 mg 200 mL/hr over 120 Minutes Intravenous Every 24 hours 05/16/22 0316 05/22/22 2159   05/16/22 1230  ceFEPIme (MAXIPIME) 2 g in sodium chloride 0.9 % 100 mL IVPB        2 g 200 mL/hr over 30 Minutes Intravenous Every 8 hours 05/16/22 0316 05/23/22 0559   05/16/22 0230  vancomycin (VANCOREADY) IVPB 2000 mg/400 mL  2,000 mg 200 mL/hr over 120 Minutes Intravenous  Once  05/16/22 0226 05/16/22 0545   05/16/22 0230  ceFEPIme (MAXIPIME) 2 g in sodium chloride 0.9 % 100 mL IVPB        2 g 200 mL/hr over 30 Minutes Intravenous  Once 05/16/22 0226 05/16/22 0340       Subjective: Patient was seen and examined at bedside.  Overnight events noted.  Patient reports doing better. Patient is hopeful that he will not have to have amputation,  instead have debridement.  Objective: Vitals:   05/16/22 2156 05/16/22 2326 05/17/22 0210 05/17/22 0823  BP: (!) 181/70 (!) 177/70 (!) 162/72 (!) 169/71  Pulse: 91 87 85 88  Resp:  16 17 20   Temp:  98.5 F (36.9 C) 98.5 F (36.9 C) 98.5 F (36.9 C)  TempSrc:  Oral Oral   SpO2:  94% 97% 99%  Weight:      Height:        Intake/Output Summary (Last 24 hours) at 05/17/2022 1225 Last data filed at 05/17/2022 0907 Gross per 24 hour  Intake 2776.53 ml  Output 1175 ml  Net 1601.53 ml   Filed Weights   05/15/22 1911  Weight: 104 kg    Examination:  General exam: Appears comfortable, not in any acute distress. Respiratory system: CTA bilaterally, respiratory effort normal, RR 15. Cardiovascular system: S1 & S2 heard, regular rate and rhythm, no murmur. Gastrointestinal system: Abdomen is soft, non tender, non distended, BS+ Central nervous system: Alert and oriented x 3. No focal neurological deficits. Extremities: No edema, no cyanosis, no clubbing, left heel decubitus ulcer with draining sinuses. Skin: No rashes, lesions or ulcers Psychiatry: Judgement and insight appear normal. Mood & affect appropriate.     Data Reviewed: I have personally reviewed following labs and imaging studies  CBC: Recent Labs  Lab 05/16/22 0133  WBC 11.2*  NEUTROABS 8.3*  HGB 12.7*  HCT 38.0*  MCV 92.0  PLT 267   Basic Metabolic Panel: Recent Labs  Lab 05/16/22 0133  NA 135  K 3.6  CL 101  CO2 25  GLUCOSE 167*  BUN 33*  CREATININE 1.13  CALCIUM 9.0   GFR: Estimated Creatinine Clearance: 77.2 mL/min (by C-G  formula based on SCr of 1.13 mg/dL). Liver Function Tests: Recent Labs  Lab 05/16/22 0133  AST 22  ALT 22  ALKPHOS 120  BILITOT 1.1  PROT 8.7*  ALBUMIN 3.7   No results for input(s): "LIPASE", "AMYLASE" in the last 168 hours. No results for input(s): "AMMONIA" in the last 168 hours. Coagulation Profile: No results for input(s): "INR", "PROTIME" in the last 168 hours. Cardiac Enzymes: No results for input(s): "CKTOTAL", "CKMB", "CKMBINDEX", "TROPONINI" in the last 168 hours. BNP (last 3 results) No results for input(s): "PROBNP" in the last 8760 hours. HbA1C: No results for input(s): "HGBA1C" in the last 72 hours. CBG: Recent Labs  Lab 05/16/22 1905 05/17/22 0100 05/17/22 0405 05/17/22 0825 05/17/22 1130  GLUCAP 185* 152* 129* 135* 157*   Lipid Profile: No results for input(s): "CHOL", "HDL", "LDLCALC", "TRIG", "CHOLHDL", "LDLDIRECT" in the last 72 hours. Thyroid Function Tests: No results for input(s): "TSH", "T4TOTAL", "FREET4", "T3FREE", "THYROIDAB" in the last 72 hours. Anemia Panel: No results for input(s): "VITAMINB12", "FOLATE", "FERRITIN", "TIBC", "IRON", "RETICCTPCT" in the last 72 hours. Sepsis Labs: Recent Labs  Lab 05/16/22 0133 05/16/22 0352  PROCALCITON 0.14  --   LATICACIDVEN 1.2 1.1    Recent Results (from the past 240 hour(s))  Blood culture (routine x 2)     Status: None (Preliminary result)   Collection Time: 05/16/22  1:33 AM   Specimen: BLOOD  Result Value Ref Range Status   Specimen Description BLOOD LEFT AC  Final   Special Requests   Final    BOTTLES DRAWN AEROBIC AND ANAEROBIC Blood Culture adequate volume   Culture   Final    NO GROWTH 1 DAY Performed at [facility], [address] [place], [place] [address]    Report Status PENDING  Incomplete  Blood culture (routine x 2)     Status: None (Preliminary result)   Collection Time: 05/16/22  1:33 AM   Specimen: BLOOD RIGHT HAND  Result Value Ref Range Status   Specimen  Description BLOOD RIGHT HAND  Final   Special Requests   Final    BOTTLES DRAWN AEROBIC AND ANAEROBIC Blood Culture adequate volume   Culture   Final    NO GROWTH 1 DAY Performed at [facility], [address] [place], [place] [address]    Report Status PENDING  Incomplete  Aerobic/Anaerobic Culture w Gram Stain (surgical/deep wound)     Status: None (Preliminary result)   Collection Time: 05/16/22  3:51 AM   Specimen: Heel  Result Value Ref Range Status   Specimen Description   Final    HEEL Performed at [facility], [address], [place], [place] [address]    Special Requests   Final    NONE Performed at [facility], [address] [place], [place] [address]    Gram Stain   Final    RARE WBC PRESENT,BOTH PMN AND MONONUCLEAR FEW GRAM POSITIVE COCCI IN PAIRS FEW GRAM POSITIVE RODS FEW GRAM NEGATIVE RODS    Culture   Final    MODERATE GRAM NEGATIVE RODS IDENTIFICATION AND SUSCEPTIBILITIES TO FOLLOW Performed at [facility], [address], [place], [place] [address]    Report Status PENDING  Incomplete    Radiology Studies: PERIPHERAL VASCULAR CATHETERIZATION  Result Date: 05/16/2022 See surgical note for result.  MR HEEL LEFT WO CONTRAST  Result Date: 05/16/2022 CLINICAL DATA:  Infected heel wound. EXAM: MR OF THE LEFT HEEL WITHOUT CONTRAST TECHNIQUE: Multiplanar, multisequence MR imaging of the left hindfoot was performed. No intravenous contrast was administered. COMPARISON:  Radiographs 05/15/2022 FINDINGS: There is an open wound located along the posterior aspect of the heel acute extends down close to the bone. There is underlying abnormal T1 and T2 signal intensity in the posterior aspect of the calcaneus consistent with osteomyelitis. The tibiotalar, subtalar and midfoot joint spaces are maintained. No findings suspicious for septic arthritis and no other sites of osteomyelitis. There is diffuse cellulitis and myofasciitis  without definite findings for pyomyositis. The Achilles tendon is ruptured from its distal attachment site and retracted approximately 2.5 cm. This could be pathologic and related to septic tendinopathy. The medial and lateral ankle ligaments and tendons are intact. Plantar fascia is intact. IMPRESSION: 1. 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 2. Diffuse cellulitis and myofasciitis without definite findings for focal subcutaneous abscess or pyomyositis. 3. Ruptured Achilles tendon from its distal attachment site and retracted approximately 2.5 cm. This is likely pathologic and related to septic tendinopathy. Electronically Signed   By: Rudie Meyer M.D.   On: 05/16/2022 07:36   DG Foot Complete Left  Result Date: 05/15/2022 Please see detailed radiograph report in  office note.   Scheduled Meds:  amLODipine  10 mg Oral Daily   aspirin EC  81 mg Oral Daily   clopidogrel  75 mg Oral Daily   insulin aspart  0-15 Units Subcutaneous Q4H   levothyroxine  50 mcg Oral Q0600   metoprolol succinate  25 mg Oral Daily   sodium chloride flush  3 mL Intravenous Q12H   Continuous Infusions:  sodium chloride 75 mL/hr at 05/17/22 0907   sodium chloride     ceFEPime (MAXIPIME) IV Stopped (05/17/22 0534)   vancomycin Stopped (05/17/22 0247)     LOS: 1 day    Time spent: 35 mins    Mont Jagoda, MD [facility]   If 7PM-7AM, please contact night-coverage

## 2022-05-17 NOTE — Progress Notes (Signed)
Per Dr. Lilian Kapur place new dressing of betadine and mepilex.

## 2022-05-17 NOTE — Progress Notes (Signed)
  Subjective:  Patient ID: AVI KERSCHNER, male    DOB: 1956-05-24,  MRN: [id number]  Resting comfortably in bed POD #1 angiography of left lower extremity, his wife is at bedside.  Says he feels okay.  Denies fevers chills nausea vomiting chest pain shortness of breath Objective:   Vitals:   05/17/22 0210 05/17/22 0823  BP: (!) 162/72 (!) 169/71  Pulse: 85 88  Resp: 17 20  Temp: 98.5 F (36.9 C) 98.5 F (36.9 C)  SpO2: 97% 99%   General AA&O x3. Normal mood and affect.  Vascular Foot has increased warmth and perfusion, capillary fill improved  Neurologic Epicritic sensation grossly absent.  Dermatologic Necrotic posterior heel ulceration the cellulitis is still persistent in the medial ankle and medial calf, improved laterally and anteriorly  Orthopedic: MMT 5/5 in dorsiflexion, plantarflexion, inversion, and eversion. Normal joint ROM without pain or crepitus.         Assessment & Plan:  Patient was evaluated and treated and all questions answered.  Osteomyelitis calcaneus, diabetic foot infection, heel ulceration -I Olkowski discussion with the patient and his wife spent approximately 30 minutes with him at bedside discussing his prognosis.  He has overall improved blood flow and now has two-vessel runoff through his AT and peroneal.  There is 0% flow and complete occlusion of his PT artery.  Suspect this is likely why his cellulitis in his angiosome is not improving.  We discussed his Dubose-term salvage prospect and I expect a less than 20% success chance of being able to save his limb with bone debridement and partial calcanectomy.  We discussed the functional imitations of this and he likely will be permanently disabled with Ciccone-term bracing necessary if he is able to heal.  He has been frustrated with his progress over the last several months and would like to make all attempts to save his foot.  We discussed the risks and benefits of this.  He is prepared to proceed with this and  we will plan for surgery tomorrow 05/18/2022 n.p.o. after midnight.  We discussed he will be nonweightbearing after surgery, wound VAC will be necessary.  Graft may be possible as well as staged surgery with multiple debridements may be necessary as well.  Dr. Logan Bores will be taking him to the operating room tomorrow.  We also discussed his overall rehab process including need for nonweightbearing, PICC line and Altieri-term wound care.  We also discussed goals and time points and markers of failure that he should proceed to below-knee amputation including worsening infection or poor wound healing.  He understands and wishes to proceed  Edwin Cap, DPM  Accessible via secure chat for questions or concerns.

## 2022-05-17 NOTE — Progress Notes (Signed)
Pharmacy Antibiotic Note  [name] is a 66 y.o. male w/ PMH of DM, HTN admitted on 05/16/2022 with cellulitis.  Pharmacy has been consulted for cefepime & vancomycin dosing   Plan:  1) continue cefepime 2 grams IV every 8 hours  2) adjust vancomycin to 750 mg IV every 12 hours Goal AUC 400-550. Expected AUC: 502 SCr used: 1.13 Ke 0.057h-1, T1/2 12.1h  Pharmacy will continue to follow and will adjust abx dosing whenever warranted.  Temp (24hrs), Avg:98.5 F (36.9 C), Min:98.2 F (36.8 C), Max:98.6 F (37 C)   Recent Labs  Lab 05/16/22 0133 05/16/22 0352  WBC 11.2*  --   CREATININE 1.13  --   LATICACIDVEN 1.2 1.1     Estimated Creatinine Clearance: 77.2 mL/min (by C-G formula based on SCr of 1.13 mg/dL).    No Known Allergies  Antimicrobials this admission: 11/28 cefepime >>  11/28 vancomycin >>   Microbiology results: 11/28 BCx: NGTD 11/28 WoundCx: GPC pairs, GPR, GNR (pending)  Thank you for allowing pharmacy to be a part of this patient's care.  Burnis Medin, PharmD, BCPS  05/17/2022 12:56 PM

## 2022-05-18 ENCOUNTER — Encounter
Admission: EM | Disposition: A | Discharge status: Home Health | Payer: Self-pay | Source: Home / Self Care | Attending: Internal Medicine

## 2022-05-18 DIAGNOSIS — L03116 Cellulitis of left lower limb: Secondary | ICD-10-CM | POA: Diagnosis not present

## 2022-05-18 DIAGNOSIS — S92032B Displaced avulsion fracture of tuberosity of left calcaneus, initial encounter for open fracture: Secondary | ICD-10-CM

## 2022-05-18 LAB — CBC
HCT: 32.1 % — ABNORMAL LOW (ref 39.0–52.0)
Hemoglobin: 10.8 g/dL — ABNORMAL LOW (ref 13.0–17.0)
MCH: 31.2 pg (ref 26.0–34.0)
MCHC: 33.6 g/dL (ref 30.0–36.0)
MCV: 92.8 fL (ref 80.0–100.0)
Platelets: 239 10*3/uL (ref 150–400)
RBC: 3.46 MIL/uL — ABNORMAL LOW (ref 4.22–5.81)
RDW: 12.2 % (ref 11.5–15.5)
WBC: 10.7 10*3/uL — ABNORMAL HIGH (ref 4.0–10.5)
nRBC: 0 % (ref 0.0–0.2)

## 2022-05-18 LAB — GLUCOSE, CAPILLARY
Glucose-Capillary: 127 mg/dL — ABNORMAL HIGH (ref 70–99)
Glucose-Capillary: 131 mg/dL — ABNORMAL HIGH (ref 70–99)
Glucose-Capillary: 141 mg/dL — ABNORMAL HIGH (ref 70–99)
Glucose-Capillary: 152 mg/dL — ABNORMAL HIGH (ref 70–99)
Glucose-Capillary: 167 mg/dL — ABNORMAL HIGH (ref 70–99)
Glucose-Capillary: 186 mg/dL — ABNORMAL HIGH (ref 70–99)

## 2022-05-18 LAB — BASIC METABOLIC PANEL
Anion gap: 6 (ref 5–15)
BUN: 19 mg/dL (ref 8–23)
CO2: 22 mmol/L (ref 22–32)
Calcium: 8.7 mg/dL — ABNORMAL LOW (ref 8.9–10.3)
Chloride: 112 mmol/L — ABNORMAL HIGH (ref 98–111)
Creatinine, Ser: 0.75 mg/dL (ref 0.61–1.24)
GFR, Estimated: >60 mL/min (ref >60)
Glucose, Bld: 127 mg/dL — ABNORMAL HIGH (ref 70–99)
Potassium: 3.9 mmol/L (ref 3.5–5.1)
Sodium: 140 mmol/L (ref 135–145)

## 2022-05-18 LAB — PHOSPHORUS: Phosphorus: 2.7 mg/dL (ref 2.5–4.6)

## 2022-05-18 LAB — SURGICAL PCR SCREEN
MRSA, PCR: NEGATIVE
Staphylococcus aureus: NEGATIVE

## 2022-05-18 LAB — MAGNESIUM: Magnesium: 2.1 mg/dL (ref 1.7–2.4)

## 2022-05-18 SURGERY — INCISION AND DRAINAGE
Anesthesia: Choice | Laterality: Left

## 2022-05-18 MED ORDER — VANCOMYCIN HCL IN DEXTROSE 1-5 GM/200ML-% IV SOLN
1000.0000 mg | Freq: Two times a day (BID) | INTRAVENOUS | Status: DC
  Administered 2022-05-18 – 2022-05-21 (×6): 1000 mg via INTRAVENOUS
  Filled 2022-05-18 (×7): qty 200

## 2022-05-18 MED ORDER — LACTULOSE 10 GM/15ML PO SOLN
20.0000 g | Freq: Once | ORAL | Status: DC
  Filled 2022-05-18: qty 30

## 2022-05-18 NOTE — Progress Notes (Signed)
Subjective:  Patient ID: [name], male    DOB: 1956/04/10,  MRN: [id number]  Chief Complaint  Patient presents with   Foot Ulcer    66 y.o. male presents for wound care.  Patient presents with complaint of left heel wound.  He states that he has been doing wound VAC.  He has not noticed much difference.  He wanted to get it evaluated.  Denies any other acute complaints   Review of Systems: Negative except as noted in the HPI. Denies N/V/F/Ch.  Past Medical History:  Diagnosis Date   Diabetes mellitus    Hematospermia    Hypertension    Nephrolithiasis 02/07/07   Viral meningitis    No current facility-administered medications for this visit. No current outpatient medications on file.  Facility-Administered Medications Ordered in Other Visits:    0.9 %  sodium chloride infusion, , Intravenous, Continuous, Schnier, Dolores Lory, MD, Last Rate: 75 mL/hr at 05/17/22 2112, New Bag at 05/17/22 2112   0.9 %  sodium chloride infusion, 250 mL, Intravenous, PRN, Schnier, Dolores Lory, MD   acetaminophen (TYLENOL) tablet 650 mg, 650 mg, Oral, Q6H PRN **OR** acetaminophen (TYLENOL) suppository 650 mg, 650 mg, Rectal, Q6H PRN, Schnier, Dolores Lory, MD   acetaminophen (TYLENOL) tablet 650 mg, 650 mg, Oral, Q4H PRN, Schnier, Dolores Lory, MD   ALPRAZolam Duanne Moron) tablet 0.5 mg, 0.5 mg, Oral, TID PRN, Delana Meyer, Dolores Lory, MD, 0.5 mg at 05/17/22 2105   amLODipine (NORVASC) tablet 10 mg, 10 mg, Oral, Daily, Shawna Clamp, MD, 10 mg at 05/18/22 [id number]   aspirin EC tablet 81 mg, 81 mg, Oral, Daily, Schnier, Dolores Lory, MD, 81 mg at 05/17/22 0835   ceFEPIme (MAXIPIME) 2 g in sodium chloride 0.9 % 100 mL IVPB, 2 g, Intravenous, Q8H, Schnier, Dolores Lory, MD, Last Rate: 200 mL/hr at 05/18/22 0451, 2 g at 05/18/22 0451   clopidogrel (PLAVIX) tablet 75 mg, 75 mg, Oral, Daily, Schnier, Dolores Lory, MD, 75 mg at 05/17/22 [id number]   hydrALAZINE (APRESOLINE) injection 10 mg, 10 mg, Intravenous, Q6H PRN, Schnier, Dolores Lory, MD    HYDROcodone-acetaminophen (NORCO/VICODIN) 5-325 MG per tablet 1-2 tablet, 1-2 tablet, Oral, Q4H PRN, Schnier, Dolores Lory, MD   insulin aspart (novoLOG) injection 0-15 Units, 0-15 Units, Subcutaneous, Q4H, Schnier, Dolores Lory, MD, 2 Units at 05/18/22 0451   labetalol (NORMODYNE) injection 10 mg, 10 mg, Intravenous, Q10 min PRN, Schnier, Dolores Lory, MD, 10 mg at 05/16/22 2202   levothyroxine (SYNTHROID) tablet 50 mcg, 50 mcg, Oral, Q0600, Schnier, Dolores Lory, MD, 50 mcg at 05/17/22 0501   metoprolol succinate (TOPROL-XL) 24 hr tablet 25 mg, 25 mg, Oral, Daily, Schnier, Dolores Lory, MD, 25 mg at 05/18/22 0817   morphine (PF) 2 MG/ML injection 2 mg, 2 mg, Intravenous, Q2H PRN, Schnier, Dolores Lory, MD   morphine (PF) 4 MG/ML injection 2 mg, 2 mg, Intravenous, Q1H PRN, Schnier, Dolores Lory, MD   ondansetron (ZOFRAN) tablet 4 mg, 4 mg, Oral, Q6H PRN **OR** ondansetron (ZOFRAN) injection 4 mg, 4 mg, Intravenous, Q6H PRN, Schnier, Dolores Lory, MD   ondansetron [facility]) injection 4 mg, 4 mg, Intravenous, Q6H PRN, Schnier, Dolores Lory, MD   oxyCODONE (Oxy IR/ROXICODONE) immediate release tablet 5-10 mg, 5-10 mg, Oral, Q4H PRN, Schnier, Dolores Lory, MD   sodium chloride flush (NS) 0.9 % injection 3 mL, 3 mL, Intravenous, Q12H, Schnier, Dolores Lory, MD, 3 mL at 05/17/22 0953   sodium chloride flush (NS) 0.9 % injection 3 mL, 3 mL, Intravenous,  PRN, Schnier, Latina Craver, MD   vancomycin (VANCOREADY) IVPB 750 mg/150 mL, 750 mg, Intravenous, Q12H, Lowella Bandy, [facility], Last Rate: 150 mL/hr at 05/18/22 0555, 750 mg at 05/18/22 8657  Social History   Tobacco Use  Smoking Status Former  Smokeless Tobacco Never  Tobacco Comments   22 yrs ago     No Known Allergies Objective:  There were no vitals filed for this visit. There is no height or weight on file to calculate BMI. Constitutional Well developed. Well nourished.  Vascular Dorsalis pedis pulses nonpalpable bilaterally. Posterior tibial pulses nonpalpable  bilaterally. Capillary refill normal to all digits.  No cyanosis or clubbing noted. Pedal hair growth normal.  Neurologic Normal speech. Oriented to person, place, and time. Protective sensation absent  Dermatologic Wound Location: Left heel wound fat layer exposed Wound Base: Regular Peri-wound: Calloused Exudate: Scant/small amount Serosanguinous exudate Wound Measurements: -See below  Orthopedic: No pain to palpation either foot.   Radiographs: 1. Redemonstration of posterior inferior calcaneal wound. There is again adjacent subcortical marrow edema within the posterior calcaneus, with slightly decreased intensity compared to 11/03/2021 body similar distribution. 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 No definitive cortical erosion is seen. 2. No abscess is seen. Assessment:   1. Type 2 diabetes mellitus with diabetic neuropathy, with Tritschler-term current use of insulin (HCC)   2. 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 Erythema     Plan:  Patient was evaluated and treated and all questions answered.  Ulcer left heel ulceration with fat layer exposed -Debridement as below. -Dressed with Santyl wet-to-dry was sent to the pharmacy. -Continue off-loading with surgical shoe. -We will plan on doing Santyl wet-to-dry.  The heel appears to be more stagnant.  There is some erythema.  I will place him on doxycycline.  If there is worsening of the infection of asked him to go to the emergency room right away.  He states understanding. -I have placed a referral for him to be seen by vascular surgery to see if there is anything that could be done to improve the circulation to the heel as he is a high risk of undergoing below the knee amputation  Procedure: Excisional Debridement of Wound~stagnant Tool: Sharp chisel blade/tissue nipper Rationale: Removal of non-viable soft tissue from the wound to promote healing.  Anesthesia: none Pre-Debridement Wound  Measurements: 3 cm x 1.5 cm x 0.3 cm  Post-Debridement Wound Measurements: 3.1 x 1.6 x 0.3 Type of Debridement: Sharp Excisional Tissue Removed: Non-viable soft tissue Blood loss: Minimal (<50cc) Depth of Debridement: subcutaneous tissue. Technique: Sharp excisional debridement to bleeding, viable wound base.  Wound Progress: The wound depth and size is about the same.  Even with negative pressure wound therapy. Dressing: Dry, sterile, compression dressing. Disposition: Patient tolerated procedure well. Patient to return in 1 week for follow-up.  No follow-ups on file.

## 2022-05-18 NOTE — TOC CM/SW Note (Signed)
TOC following progress for wound care and discharge needs.  Charlynn Court, CSW [phone]

## 2022-05-18 NOTE — Hospital Course (Addendum)
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  Patient has seen podiatrist on 05/15/22 with acute foot pain and x-rays revealed an avulsion fracture of calcaneum raising the concern for possible acute osteomyelitis.  Patient was started on doxycycline for possible wound infection. Patient has also seen vascular surgery on 11/16 with recommendation for left leg angiography.  Patient is admitted for further evaluation.  Vascular surgery consulted , Patient underwent successful recanalization left lower extremity for limb salvage with two-vessel runoff.  Patient tolerated procedure well.  Podiatry and vascular surgery has scheduled for below-knee amputation on 12/1.  12/1: lt BKA amputation 12/2: PT, OT - CIR, MRI brain - neg 12/3: Abx changed to PO Cipro + Amoxy 12/4: Dressing change by vascular surgery today.  CIR evaluation in progress

## 2022-05-18 NOTE — H&P (View-Only) (Signed)
[facility]  Daily Progress Note   Subjective  - POD # 2. S/P PTA of the left Peroneal and left anterior tibial artery.   [name] is a 66 y.o. 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  Patient went to see his podiatrist on 11/27 with acute foot pain after feeling a pop in his foot and x-rays revealed an avulsion fracture of the calcaneus raising the concern for possible acute osteomyelitis (per review of note on 11/27).  Patient is currently on doxycycline for a possible wound infection.  Reports he had a fever of 103 on 11/23 but none since.  Of note he saw vascular surgery on 11/16 with a recommendation for left leg angiography.  According to podiatry, they would like the work-up to be done as an inpatient which includes IV antibiotics, MRI and vascular consult.     Objective Vitals:   05/17/22 0823 05/17/22 1915 05/18/22 0309 05/18/22 0807  BP: (!) 169/71 (!) 166/70 (!) 165/64 (!) 171/64  Pulse: 88 97 88 91  Resp: 20 16 16 16   Temp: 98.5 F (36.9 C) 98.9 F (37.2 C) 99.1 F (37.3 C) 99.2 F (37.3 C)  TempSrc:  Oral Oral Oral  SpO2: 99% 98% 97% 99%  Weight:      Height:        Intake/Output Summary (Last 24 hours) at 05/18/2022 1213 Last data filed at 05/18/2022 1159 Gross per 24 hour  Intake 1177.98 ml  Output 2125 ml  Net -947.02 ml    PULM  CTAB CV  RRR VASC  The left common femoral is widely patent as is the profunda femoris.  The SFA and popliteal artery demonstrate diffuse disease but there are no hemodynamically significant lesions.  The trifurcation is diseased with occlusion of the anterior tibial over a distance of approximately 8 to 10 cm associated with a greater than 90%  distal lesion, peroneal demonstrates a greater than 90% lesion in its proximal portion over a length of 40 to 60 mm and posterior tibial artery is occluded throughout its entire length.      Following angioplasty the anterior tibial now is now widely patent and demonstrates in-line flow with less than 20% residual stenosis.  It is much improved and looks quite nice. Angioplasty of the peroneal also yields an excellent result with less than 20% residual stenosis.  Laboratory CBC    Component Value Date/Time   WBC 10.7 (H) 05/18/2022 0605   HGB 10.8 (L) 05/18/2022 0605   HCT 32.1 (L) 05/18/2022 0605   PLT 239 05/18/2022 0605    BMET    Component Value Date/Time   NA 140 05/18/2022 0605   K 3.9 05/18/2022 0605   CL 112 (H) 05/18/2022 0605   CO2 22 05/18/2022 0605   GLUCOSE 127 (H) 05/18/2022 0605   BUN 19 05/18/2022 0605   CREATININE 0.75 05/18/2022 0605   CREATININE 1.31 (H) 03/15/2020 1700   CALCIUM 8.7 (L) 05/18/2022 0605   GFRNONAA >60 05/18/2022 0605   GFRAA >60 01/10/2020 1107    Assessment/Planning: POD #2 s/p PTA of the left Peroneal and left anterior tibial artery.   After Fessenden discussion with the patient and his wife at the bedside he has decided to proceed with a  below the knee amputation for 05/19/2022.  Patient states he feels better doing the amputation post angiogram of his left lower extremity on 05/16/2022.  Patient will be made n.p.o. after midnight tonight. The procedure, benefits, risks, complications, of the procedure were described in detail today.  All patient's questions were answered.  Patient and wife verbalized her understanding.  We will proceed with a left lower extremity below the knee amputation tomorrow.   Marcie Bal  05/18/2022, 12:13 PM

## 2022-05-18 NOTE — Progress Notes (Signed)
Pharmacy Antibiotic Note  [name] is a 66 y.o. male w/ PMH of DM, HTN admitted on 05/16/2022 with cellulitis.  Pharmacy has been consulted for cefepime & vancomycin dosing. Renal function has continued to improve since    Plan:  1) continue cefepime 2 grams IV every 8 hours  2) adjust vancomycin to 1000 mg IV every 12 hours Goal AUC 400-550 Expected AUC: 485 SCr used: 0.80 mg/dL (rounded up) Ke 6.160V-3, T1/2 8.7h  Pharmacy will continue to follow and will adjust abx dosing whenever warranted.  Temp (24hrs), Avg:98.8 F (37.1 C), Min:98.5 F (36.9 C), Max:99.1 F (37.3 C)   Recent Labs  Lab 05/16/22 0133 05/16/22 0352 05/18/22 0605  WBC 11.2*  --  10.7*  CREATININE 1.13  --  0.75  LATICACIDVEN 1.2 1.1  --      Estimated Creatinine Clearance: 109 mL/min (by C-G formula based on SCr of 0.75 mg/dL).    No Known Allergies  Antimicrobials this admission: 11/28 cefepime >>  11/28 vancomycin >>   Microbiology results: 11/28 BCx: NGTD 11/28 WoundCx: GPC pairs, GPR, P aeruginosa (final culture pending)  Thank you for allowing pharmacy to be a part of this patient's care.  Burnis Medin, PharmD, BCPS  05/18/2022 7:11 AM

## 2022-05-18 NOTE — Progress Notes (Signed)
[facility]  Daily Progress Note   Subjective  - POD # 2. S/P PTA of the left Peroneal and left anterior tibial artery.   [name] is a 66 y.o. 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  Patient went to see his podiatrist on 11/27 with acute foot pain after feeling a pop in his foot and x-rays revealed an avulsion fracture of the calcaneus raising the concern for possible acute osteomyelitis (per review of note on 11/27).  Patient is currently on doxycycline for a possible wound infection.  Reports he had a fever of 103 on 11/23 but none since.  Of note he saw vascular surgery on 11/16 with a recommendation for left leg angiography.  According to podiatry, they would like the work-up to be done as an inpatient which includes IV antibiotics, MRI and vascular consult.     Objective Vitals:   05/17/22 0823 05/17/22 1915 05/18/22 0309 05/18/22 0807  BP: (!) 169/71 (!) 166/70 (!) 165/64 (!) 171/64  Pulse: 88 97 88 91  Resp: 20 16 16 16   Temp: 98.5 F (36.9 C) 98.9 F (37.2 C) 99.1 F (37.3 C) 99.2 F (37.3 C)  TempSrc:  Oral Oral Oral  SpO2: 99% 98% 97% 99%  Weight:      Height:        Intake/Output Summary (Last 24 hours) at 05/18/2022 1213 Last data filed at 05/18/2022 1159 Gross per 24 hour  Intake 1177.98 ml  Output 2125 ml  Net -947.02 ml    PULM  CTAB CV  RRR VASC  The left common femoral is widely patent as is the profunda femoris.  The SFA and popliteal artery demonstrate diffuse disease but there are no hemodynamically significant lesions.  The trifurcation is diseased with occlusion of the anterior tibial over a distance of approximately 8 to 10 cm associated with a greater than 90%  distal lesion, peroneal demonstrates a greater than 90% lesion in its proximal portion over a length of 40 to 60 mm and posterior tibial artery is occluded throughout its entire length.      Following angioplasty the anterior tibial now is now widely patent and demonstrates in-line flow with less than 20% residual stenosis.  It is much improved and looks quite nice. Angioplasty of the peroneal also yields an excellent result with less than 20% residual stenosis.  Laboratory CBC    Component Value Date/Time   WBC 10.7 (H) 05/18/2022 0605   HGB 10.8 (L) 05/18/2022 0605   HCT 32.1 (L) 05/18/2022 0605   PLT 239 05/18/2022 0605    BMET    Component Value Date/Time   NA 140 05/18/2022 0605   K 3.9 05/18/2022 0605   CL 112 (H) 05/18/2022 0605   CO2 22 05/18/2022 0605   GLUCOSE 127 (H) 05/18/2022 0605   BUN 19 05/18/2022 0605   CREATININE 0.75 05/18/2022 0605   CREATININE 1.31 (H) 03/15/2020 1700   CALCIUM 8.7 (L) 05/18/2022 0605   GFRNONAA >60 05/18/2022 0605   GFRAA >60 01/10/2020 1107    Assessment/Planning: POD #2 s/p PTA of the left Peroneal and left anterior tibial artery.   After Fessenden discussion with the patient and his wife at the bedside he has decided to proceed with a  below the knee amputation for 05/19/2022.  Patient states he feels better doing the amputation post angiogram of his left lower extremity on 05/16/2022.  Patient will be made n.p.o. after midnight tonight. The procedure, benefits, risks, complications, of the procedure were described in detail today.  All patient's questions were answered.  Patient and wife verbalized her understanding.  We will proceed with a left lower extremity below the knee amputation tomorrow.   Marcie Bal  05/18/2022, 12:13 PM

## 2022-05-18 NOTE — Progress Notes (Signed)
  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  Patient has seen podiatrist on 05/15/22 with acute foot pain and x-rays revealed an avulsion fracture of calcaneum raising the concern for possible acute osteomyelitis.  Patient was started on doxycycline for possible wound infection. Patient has also seen vascular surgery on 11/16 with recommendation for left leg angiography.  Patient is admitted for further evaluation.  Vascular surgery consulted , Patient underwent successful recanalization left lower extremity for limb salvage with two-vessel runoff.  Patient tolerated procedure well.  Podiatry and vascular surgery has scheduled for below-knee amputation on Friday.  Assessment and Plan: Cellulitis of left lower extremity: Chronic osteomyelitis with draining sinuses with possible acute osteomyelitis:  Avulsion Fracture of left calcaneum: Patient presented with chronic nonhealing wound for last 10 months completed course of antibiotics. MRI : osteomyelitis involving the posterior aspect of the calcaneus.  Initiated on empiric Rocephin and vancomycin. Wound culture grow Pseudomonas aeruginosa, susceptible to cefepime.  Final culture still pending.  Will continue vancomycin for another day until the final culture results available. Patient is scheduled for below-knee amputation tomorrow.   Hypertensive urgency: BP 230/83 on arrival Continue home metoprolol Blood pressure is better.   Atherosclerosis of native arteries of the extremities  with ulceration (HCC) Continue home medicines.   Hypothyroidism: Continue levothyroxine   Type 2 diabetes with diabetic neuropathy: Continue current regimen, glucose relatively controlled.        Subjective:  Patient doing well today, denies any shortness of breath.  Physical Exam: Vitals:   05/17/22 0823 05/17/22 1915 05/18/22 0309 05/18/22 0807  BP: (!) 169/71 (!) 166/70 (!) 165/64 (!) 171/64  Pulse: 88 97 88 91  Resp: 20 16 16 16   Temp: 98.5 F (36.9 C) 98.9 F (37.2 C) 99.1 F (37.3 C) 99.2 F (37.3 C)  TempSrc:  Oral Oral Oral  SpO2: 99% 98% 97% 99%  Weight:      Height:       General exam: Appears calm and comfortable  Respiratory system: Clear to auscultation. Respiratory effort normal. Cardiovascular system: S1 & S2 heard, RRR. No JVD, murmurs, rubs, gallops or clicks.  Gastrointestinal system: Abdomen is nondistended, soft and nontender. No organomegaly or masses felt. Normal bowel sounds heard. Central nervous system: Alert and oriented. No focal neurological deficits. Extremities: Left leg edema. Skin: No rashes, lesions or ulcers Psychiatry: Judgement and insight appear normal. Mood & affect appropriate.   Data Reviewed:  Lab results reviewed.  Family Communication: Wife updated at bedside.  Disposition: Status is: Inpatient Remains inpatient appropriate because: Severity of disease, IV antibiotics, pending inpatient procedure.  Planned Discharge Destination: Home with Home Health    Time spent: 35 minutes  Author: , MD 05/18/2022 12:39 PM  For on call review [email or web address].

## 2022-05-19 ENCOUNTER — Inpatient Hospital Stay: Payer: No Typology Code available for payment source | Admitting: Registered Nurse

## 2022-05-19 ENCOUNTER — Other Ambulatory Visit: Payer: Self-pay

## 2022-05-19 ENCOUNTER — Encounter
Admission: EM | Disposition: A | Discharge status: Home Health | Payer: Self-pay | Source: Home / Self Care | Attending: Internal Medicine

## 2022-05-19 ENCOUNTER — Encounter: Payer: Self-pay | Admitting: Internal Medicine

## 2022-05-19 DIAGNOSIS — M86072 Acute hematogenous osteomyelitis, left ankle and foot: Secondary | ICD-10-CM

## 2022-05-19 DIAGNOSIS — L03116 Cellulitis of left lower limb: Secondary | ICD-10-CM | POA: Diagnosis not present

## 2022-05-19 DIAGNOSIS — E1140 Type 2 diabetes mellitus with diabetic neuropathy, unspecified: Secondary | ICD-10-CM | POA: Diagnosis not present

## 2022-05-19 DIAGNOSIS — Z794 Long term (current) use of insulin: Secondary | ICD-10-CM

## 2022-05-19 HISTORY — PX: AMPUTATION: SHX166

## 2022-05-19 LAB — TYPE AND SCREEN
ABO/RH(D): A NEG
Antibody Screen: NEGATIVE

## 2022-05-19 LAB — GLUCOSE, CAPILLARY
Glucose-Capillary: 114 mg/dL — ABNORMAL HIGH (ref 70–99)
Glucose-Capillary: 152 mg/dL — ABNORMAL HIGH (ref 70–99)
Glucose-Capillary: 141 mg/dL — ABNORMAL HIGH (ref 70–99)
Glucose-Capillary: 207 mg/dL — ABNORMAL HIGH (ref 70–99)
Glucose-Capillary: 236 mg/dL — ABNORMAL HIGH (ref 70–99)
Glucose-Capillary: 220 mg/dL — ABNORMAL HIGH (ref 70–99)

## 2022-05-19 LAB — CREATININE, SERUM
Creatinine, Ser: 0.82 mg/dL (ref 0.61–1.24)
GFR, Estimated: >60 mL/min (ref >60)

## 2022-05-19 SURGERY — AMPUTATION BELOW KNEE
Anesthesia: General | Site: Knee | Laterality: Left

## 2022-05-19 MED ORDER — ROCURONIUM BROMIDE 10 MG/ML (PF) SYRINGE
PREFILLED_SYRINGE | INTRAVENOUS | Status: AC
  Filled 2022-05-19: qty 10

## 2022-05-19 MED ORDER — ONDANSETRON HCL 4 MG/2ML IJ SOLN
INTRAMUSCULAR | Status: DC | PRN
  Administered 2022-05-19: 4 mg via INTRAVENOUS

## 2022-05-19 MED ORDER — PHENYLEPHRINE HCL-NACL 20-0.9 MG/250ML-% IV SOLN
INTRAVENOUS | Status: DC | PRN
  Administered 2022-05-19: 25 ug/min via INTRAVENOUS

## 2022-05-19 MED ORDER — MIDAZOLAM HCL 2 MG/2ML IJ SOLN
INTRAMUSCULAR | Status: DC | PRN
  Administered 2022-05-19: 2 mg via INTRAVENOUS

## 2022-05-19 MED ORDER — FENTANYL CITRATE (PF) 100 MCG/2ML IJ SOLN
INTRAMUSCULAR | Status: DC | PRN
  Administered 2022-05-19 (×2): 25 ug via INTRAVENOUS
  Administered 2022-05-19: 50 ug via INTRAVENOUS

## 2022-05-19 MED ORDER — ACETAMINOPHEN 10 MG/ML IV SOLN
INTRAVENOUS | Status: DC | PRN
  Administered 2022-05-19: 1000 mg via INTRAVENOUS

## 2022-05-19 MED ORDER — KETAMINE HCL 10 MG/ML IJ SOLN
INTRAMUSCULAR | Status: DC | PRN
  Administered 2022-05-19: 30 mg via INTRAVENOUS
  Administered 2022-05-19: 20 mg via INTRAVENOUS

## 2022-05-19 MED ORDER — OXYCODONE HCL 5 MG/5ML PO SOLN: 5.0000 mg | Freq: Once | ORAL | Status: DC | PRN

## 2022-05-19 MED ORDER — SUGAMMADEX SODIUM 200 MG/2ML IV SOLN
INTRAVENOUS | Status: DC | PRN
  Administered 2022-05-19: 210 mg via INTRAVENOUS

## 2022-05-19 MED ORDER — OXYCODONE-ACETAMINOPHEN 5-325 MG PO TABS: 1.0000 | ORAL_TABLET | ORAL | Status: DC | PRN

## 2022-05-19 MED ORDER — BUPIVACAINE LIPOSOME 1.3 % IJ SUSP
INTRAMUSCULAR | Status: AC
  Filled 2022-05-19: qty 20

## 2022-05-19 MED ORDER — LIDOCAINE HCL (PF) 2 % IJ SOLN
INTRAMUSCULAR | Status: AC
  Filled 2022-05-19: qty 5

## 2022-05-19 MED ORDER — LIDOCAINE HCL (CARDIAC) PF 100 MG/5ML IV SOSY
PREFILLED_SYRINGE | INTRAVENOUS | Status: DC | PRN
  Administered 2022-05-19: 100 mg via INTRAVENOUS

## 2022-05-19 MED ORDER — BUPIVACAINE HCL (PF) 0.5 % IJ SOLN
INTRAMUSCULAR | Status: AC
  Filled 2022-05-19: qty 30

## 2022-05-19 MED ORDER — EPHEDRINE SULFATE (PRESSORS) 50 MG/ML IJ SOLN
INTRAMUSCULAR | Status: DC | PRN
  Administered 2022-05-19 (×2): 5 mg via INTRAVENOUS
  Administered 2022-05-19: 7.5 mg via INTRAVENOUS

## 2022-05-19 MED ORDER — MIDAZOLAM HCL 2 MG/2ML IJ SOLN
INTRAMUSCULAR | Status: AC
  Filled 2022-05-19: qty 2

## 2022-05-19 MED ORDER — ONDANSETRON HCL 4 MG/2ML IJ SOLN
INTRAMUSCULAR | Status: AC
  Filled 2022-05-19: qty 2

## 2022-05-19 MED ORDER — EPHEDRINE 5 MG/ML INJ
INTRAVENOUS | Status: AC
  Filled 2022-05-19: qty 5

## 2022-05-19 MED ORDER — KETAMINE HCL 50 MG/5ML IJ SOSY
PREFILLED_SYRINGE | INTRAMUSCULAR | Status: AC
  Filled 2022-05-19: qty 5

## 2022-05-19 MED ORDER — PHENYLEPHRINE 80 MCG/ML (10ML) SYRINGE FOR IV PUSH (FOR BLOOD PRESSURE SUPPORT)
PREFILLED_SYRINGE | INTRAVENOUS | Status: AC
  Filled 2022-05-19: qty 10

## 2022-05-19 MED ORDER — OXYCODONE HCL 5 MG PO TABS: 5.0000 mg | ORAL_TABLET | Freq: Once | ORAL | Status: DC | PRN

## 2022-05-19 MED ORDER — ACETAMINOPHEN 10 MG/ML IV SOLN
INTRAVENOUS | Status: AC
  Filled 2022-05-19: qty 100

## 2022-05-19 MED ORDER — PROPOFOL 10 MG/ML IV BOLUS
INTRAVENOUS | Status: AC
  Filled 2022-05-19: qty 20

## 2022-05-19 MED ORDER — CHLORHEXIDINE GLUCONATE CLOTH 2 % EX PADS: 6.0000 | MEDICATED_PAD | Freq: Once | CUTANEOUS | Status: DC

## 2022-05-19 MED ORDER — PHENYLEPHRINE HCL-NACL 20-0.9 MG/250ML-% IV SOLN
INTRAVENOUS | Status: AC
  Filled 2022-05-19: qty 250

## 2022-05-19 MED ORDER — BUPIVACAINE LIPOSOME 1.3 % IJ SUSP
INTRAMUSCULAR | Status: DC | PRN
  Administered 2022-05-19: 50 mL

## 2022-05-19 MED ORDER — FENTANYL CITRATE (PF) 100 MCG/2ML IJ SOLN
INTRAMUSCULAR | Status: AC
  Filled 2022-05-19: qty 2

## 2022-05-19 MED ORDER — PHENYLEPHRINE HCL (PRESSORS) 10 MG/ML IV SOLN
INTRAVENOUS | Status: DC | PRN
  Administered 2022-05-19 (×2): 160 ug via INTRAVENOUS
  Administered 2022-05-19: 120 ug via INTRAVENOUS

## 2022-05-19 MED ORDER — DEXAMETHASONE SODIUM PHOSPHATE 10 MG/ML IJ SOLN
INTRAMUSCULAR | Status: DC | PRN
  Administered 2022-05-19: 4 mg via INTRAVENOUS

## 2022-05-19 MED ORDER — ROCURONIUM BROMIDE 100 MG/10ML IV SOLN
INTRAVENOUS | Status: DC | PRN
  Administered 2022-05-19: 50 mg via INTRAVENOUS
  Administered 2022-05-19: 10 mg via INTRAVENOUS
  Administered 2022-05-19: 20 mg via INTRAVENOUS

## 2022-05-19 MED ORDER — PROPOFOL 10 MG/ML IV BOLUS
INTRAVENOUS | Status: DC | PRN
  Administered 2022-05-19: 200 mg via INTRAVENOUS

## 2022-05-19 MED ORDER — DEXAMETHASONE SODIUM PHOSPHATE 10 MG/ML IJ SOLN
INTRAMUSCULAR | Status: AC
  Filled 2022-05-19: qty 1

## 2022-05-19 MED ORDER — HYDROMORPHONE HCL 1 MG/ML IJ SOLN: 0.2500 mg | INTRAMUSCULAR | Status: DC | PRN

## 2022-05-19 SURGICAL SUPPLY — 58 items
ADH SKN CLS APL DERMABOND .7 (GAUZE/BANDAGES/DRESSINGS) ×2
APL PRP STRL LF DISP 70% ISPRP (MISCELLANEOUS) ×1
BAG COUNTER SPONGE SURGICOUNT (BAG) ×1 IMPLANT
BAG SPNG CNTER NS LX DISP (BAG) ×1
BLADE SAGITTAL WIDE XTHICK NO (BLADE) ×1 IMPLANT
BLADE SURG SZ10 CARB STEEL (BLADE) ×1 IMPLANT
BNDG CMPR 5X4 CHSV STRCH STRL (GAUZE/BANDAGES/DRESSINGS) ×1
BNDG CMPR STD VLCR NS LF 5.8X4 (GAUZE/BANDAGES/DRESSINGS) ×1
BNDG COHESIVE 4X5 TAN STRL LF (GAUZE/BANDAGES/DRESSINGS) ×1 IMPLANT
BNDG ELASTIC 4X5.8 VLCR NS LF (GAUZE/BANDAGES/DRESSINGS) ×1 IMPLANT
BNDG GAUZE DERMACEA FLUFF 4 (GAUZE/BANDAGES/DRESSINGS) ×1 IMPLANT
BNDG GZE DERMACEA 4 6PLY (GAUZE/BANDAGES/DRESSINGS) ×1
BRUSH SCRUB EZ  4% CHG (MISCELLANEOUS) ×1
BRUSH SCRUB EZ 4% CHG (MISCELLANEOUS) ×1 IMPLANT
CANISTER WOUND CARE 500ML ATS (WOUND CARE) IMPLANT
CHLORAPREP W/TINT 26 (MISCELLANEOUS) ×1 IMPLANT
DERMABOND ADVANCED .7 DNX12 (GAUZE/BANDAGES/DRESSINGS) ×2 IMPLANT
DRAPE INCISE IOBAN 66X45 STRL (DRAPES) ×1 IMPLANT
DRSG GAUZE FLUFF 36X18 (GAUZE/BANDAGES/DRESSINGS) ×1 IMPLANT
DRSG VAC ATS MED SENSATRAC (GAUZE/BANDAGES/DRESSINGS) IMPLANT
ELECT CAUTERY BLADE 6.4 (BLADE) ×1 IMPLANT
ELECT REM PT RETURN 9FT ADLT (ELECTROSURGICAL) ×1
ELECTRODE REM PT RTRN 9FT ADLT (ELECTROSURGICAL) ×1 IMPLANT
GLOVE BIO SURGEON STRL SZ7 (GLOVE) ×2 IMPLANT
GLOVE SURG SYN 8.0 (GLOVE) ×1 IMPLANT
GLOVE SURG SYN 8.0 PF PI (GLOVE) ×1 IMPLANT
GLOVE SURG UNDER LTX SZ7.5 (GLOVE) ×1 IMPLANT
GOWN STRL REUS W/ TWL LRG LVL3 (GOWN DISPOSABLE) ×2 IMPLANT
GOWN STRL REUS W/ TWL XL LVL3 (GOWN DISPOSABLE) ×1 IMPLANT
GOWN STRL REUS W/TWL LRG LVL3 (GOWN DISPOSABLE) ×2
GOWN STRL REUS W/TWL XL LVL3 (GOWN DISPOSABLE) ×1
HANDLE YANKAUER SUCT BULB TIP (MISCELLANEOUS) ×1 IMPLANT
KIT TURNOVER KIT A (KITS) ×1 IMPLANT
LABEL OR SOLS (LABEL) ×1 IMPLANT
MANIFOLD NEPTUNE II (INSTRUMENTS) ×1 IMPLANT
NS IRRIG 500ML POUR BTL (IV SOLUTION) ×1 IMPLANT
PACK EXTREMITY ARMC (MISCELLANEOUS) ×1 IMPLANT
PAD ABD DERMACEA PRESS 5X9 (GAUZE/BANDAGES/DRESSINGS) ×1 IMPLANT
PAD PREP 24X41 OB/GYN DISP (PERSONAL CARE ITEMS) ×1 IMPLANT
SPONGE T-LAP 18X18 ~~LOC~~+RFID (SPONGE) ×2 IMPLANT
STAPLER SKIN PROX 35W (STAPLE) IMPLANT
STOCKINETTE M/LG 89821 (MISCELLANEOUS) ×1 IMPLANT
SUT MNCRL 4-0 (SUTURE) ×1
SUT MNCRL 4-0 27XMFL (SUTURE) ×1
SUT SILK 2 0 (SUTURE) ×1
SUT SILK 2-0 18XBRD TIE 12 (SUTURE) ×1 IMPLANT
SUT SILK 3 0 (SUTURE) ×1
SUT SILK 3-0 18XBRD TIE 12 (SUTURE) ×1 IMPLANT
SUT VIC AB 0 CT1 36 (SUTURE) ×4 IMPLANT
SUT VIC AB 3-0 SH 27 (SUTURE) ×6
SUT VIC AB 3-0 SH 27X BRD (SUTURE) ×2 IMPLANT
SUT VICRYL PLUS ABS 0 54 (SUTURE) ×1 IMPLANT
SUTURE MNCRL 4-0 27XMF (SUTURE) ×1 IMPLANT
SYR 20ML LL LF (SYRINGE) ×2 IMPLANT
TAPE UMBILICAL 1/8 X36 TWILL (MISCELLANEOUS) ×1 IMPLANT
TOWEL OR 17X26 4PK STRL BLUE (TOWEL DISPOSABLE) ×2 IMPLANT
TRAP FLUID SMOKE EVACUATOR (MISCELLANEOUS) ×1 IMPLANT
WATER STERILE IRR 500ML POUR (IV SOLUTION) ×1 IMPLANT

## 2022-05-19 NOTE — Anesthesia Preprocedure Evaluation (Signed)
Anesthesia Evaluation  Patient identified by MRN, date of birth, ID band Patient awake    Reviewed: Allergy & Precautions, NPO status , Patient's Chart, lab work & pertinent test results  History of Anesthesia Complications Negative for: history of anesthetic complications  Airway Mallampati: III  TM Distance: >3 FB Neck ROM: full    Dental  (+) Poor Dentition   Pulmonary neg pulmonary ROS, former smoker   Pulmonary exam normal        Cardiovascular hypertension, On Medications + Peripheral Vascular Disease  Normal cardiovascular exam  IMPRESSIONS     1. Left ventricular ejection fraction, by estimation, is 55 to 60%. The  left ventricle has normal function. The left ventricle has no regional  wall motion abnormalities. There is mild left ventricular hypertrophy.  Left ventricular diastolic parameters  are consistent with Grade I diastolic dysfunction (impaired relaxation).   2. Right ventricular systolic function is normal. The right ventricular  size is normal. Tricuspid regurgitation signal is inadequate for assessing  PA pressure.   3. The mitral valve is normal in structure. No evidence of mitral valve  regurgitation. No evidence of mitral stenosis.   4. The aortic valve is normal in structure. Aortic valve regurgitation is  not visualized. Mild to moderate aortic valve sclerosis/calcification is  present, without any evidence of aortic stenosis.   5. The inferior vena cava is normal in size with greater than 50%  respiratory variability, suggesting right atrial pressure of 3 mmHg.     Neuro/Psych  PSYCHIATRIC DISORDERS Anxiety      Neuromuscular disease    GI/Hepatic negative GI ROS, Neg liver ROS,,,  Endo/Other  diabetes, Poorly ControlledHypothyroidism    Renal/GU      Musculoskeletal   Abdominal   Peds  Hematology negative hematology ROS (+)   Anesthesia Other Findings Past Medical History: No  date: Diabetes mellitus No date: Hematospermia No date: Hypertension 02/07/07: Nephrolithiasis No date: Viral meningitis  Past Surgical History: 12-07-14: CHOLECYSTECTOMY     Comment:  laparoscopic, per Dr. Rayburn Ma  05/16/2022: LOWER EXTREMITY ANGIOGRAPHY; Left     Comment:  Procedure: Lower Extremity Angiography;  Surgeon:               Renford Dills, MD;  Location: [facility] INVASIVE CV LAB;               Service: Cardiovascular;  Laterality: Left;  BMI    Body Mass Index: 34.86 kg/m      Reproductive/Obstetrics negative OB ROS                             Anesthesia Physical Anesthesia Plan  ASA: 3  Anesthesia Plan: General ETT   Post-op Pain Management: Ofirmev IV (intra-op)* and Dilaudid IV   Induction: Intravenous  PONV Risk Score and Plan: Ondansetron, Dexamethasone, Midazolam and Treatment may vary due to age or medical condition  Airway Management Planned: Oral ETT  Additional Equipment:   Intra-op Plan:   Post-operative Plan: Extubation in OR  Informed Consent: I have reviewed the patients History and Physical, chart, labs and discussed the procedure including the risks, benefits and alternatives for the proposed anesthesia with the patient or authorized representative who has indicated his/her understanding and acceptance.     Dental Advisory Given  Plan Discussed with: Anesthesiologist, CRNA and Surgeon  Anesthesia Plan Comments: (Patient consented for risks of anesthesia including but not limited to:  - adverse reactions to medications -  damage to eyes, teeth, lips or other oral mucosa - nerve damage due to positioning  - sore throat or hoarseness - Damage to heart, brain, nerves, lungs, other parts of body or loss of life  Patient voiced understanding.)       Anesthesia Quick Evaluation

## 2022-05-19 NOTE — Progress Notes (Addendum)
  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  Patient has seen podiatrist on 05/15/22 with acute foot pain and x-rays revealed an avulsion fracture of calcaneum raising the concern for possible acute osteomyelitis.  Patient was started on doxycycline for possible wound infection. Patient has also seen vascular surgery on 11/16 with recommendation for left leg angiography.  Patient is admitted for further evaluation.  Vascular surgery consulted , Patient underwent successful recanalization left lower extremity for limb salvage with two-vessel runoff.  Patient tolerated procedure well.  Podiatry and vascular surgery has scheduled for below-knee amputation on 12/1.  Assessment and Plan: Cellulitis of left lower extremity: Chronic osteomyelitis with draining sinuses with possible acute osteomyelitis:  Avulsion Fracture of left calcaneum: Patient presented with chronic nonhealing wound for last 10 months completed course of antibiotics. MRI : osteomyelitis involving the posterior aspect of the calcaneus.  Initiated on empiric Rocephin and vancomycin. Wound culture grow Pseudomonas aeruginosa, susceptible to cefepime.   I have called the micro lab, GI still working on additional isolations, including gram-positives.  I will continue cefepime and vancomycin until culture is finalized.  Patient will be having scheduled below-knee amputation, probably need additional 2 days IV antibiotics after amputation.   Hypertensive urgency: BP 230/83 on  arrival Continue home metoprolol Blood pressure is better.   Atherosclerosis of native arteries of the extremities with ulceration (HCC) Continue home medicines.   Hypothyroidism: Continue levothyroxine   Type 2 diabetes uncontrolled with hyperglycemia and with diabetic neuropathy: Continue current regimen, glucose relatively controlled.      Subjective:  Patient doing better today, pending surgery.  Physical Exam: Vitals:   05/18/22 1904 05/19/22 0328 05/19/22 0329 05/19/22 0846  BP: (!) 169/74 (!) 182/80 (!) 164/68 (!) 167/75  Pulse: 99 97 97 96  Resp: 16 16  18   Temp: 98.1 F (36.7 C) 98.8 F (37.1 C)  98 F (36.7 C)  TempSrc: Oral Oral  Oral  SpO2: 100% 98%  99%  Weight:      Height:       General exam: Appears calm and comfortable  Respiratory system: Clear to auscultation. Respiratory effort normal. Cardiovascular system: S1 & S2 heard, RRR. No JVD, murmurs, rubs, gallops or clicks.  Gastrointestinal system: Abdomen is nondistended, soft and nontender. No organomegaly or masses felt. Normal bowel sounds heard. Central nervous system: Alert and oriented. No focal neurological deficits. Extremities: Left LE edema with wound in the left foot. Skin: No rashes, lesions or ulcers Psychiatry: Judgement and insight appear normal. Mood & affect appropriate.   Data Reviewed:  Reviewed lab results.  Family Communication: Wife updated at bedside.  Disposition: Status is: Inpatient Remains inpatient appropriate because: Severity of disease, IV antibiotics, inpatient procedure.  Planned Discharge Destination: Home with Home Health    Time spent: 35 minutes  Author: , MD 05/19/2022 10:10 AM  For on call review [email or web address].

## 2022-05-19 NOTE — Transfer of Care (Signed)
Immediate Anesthesia Transfer of Care Note  Patient: Billy Turvey Milhorn  Procedure(s) Performed: AMPUTATION BELOW KNEE (Left: Knee)  Patient Location: PACU  Anesthesia Type:General  Level of Consciousness: drowsy  Airway & Oxygen Therapy: Patient Spontanous Breathing and Patient connected to face mask oxygen  Post-op Assessment: Report given to RN and Post -op Vital signs reviewed and stable  Post vital signs: Reviewed and stable  Last Vitals:  Vitals Value Taken Time  BP 133/63 05/19/22 1434  Temp 36.1 C 05/19/22 1434  Pulse 90 05/19/22 1439  Resp 15 05/19/22 1439  SpO2 100 % 05/19/22 1439  Vitals shown include unvalidated device data.  Last Pain:  Vitals:   05/19/22 1434  TempSrc:   PainSc: Asleep      Patients Stated Pain Goal: 0 (05/19/22 1121)  Complications: No notable events documented.

## 2022-05-19 NOTE — Interval H&P Note (Signed)
History and Physical Interval Note:  05/19/2022 11:58 AM  [name]  has presented today for surgery, with the diagnosis of Osteomyelitis.  The various methods of treatment have been discussed with the patient and family. After consideration of risks, benefits and other options for treatment, the patient has consented to  Procedure(s): AMPUTATION BELOW KNEE (Left) as a surgical intervention.  The patient's history has been reviewed, patient examined, no change in status, stable for surgery.  I have reviewed the patient's chart and labs.  Questions were answered to the patient's satisfaction.     Levora Dredge

## 2022-05-19 NOTE — Plan of Care (Signed)
  Problem: Skin Integrity: Goal: Skin integrity will improve Outcome: Progressing   

## 2022-05-19 NOTE — Progress Notes (Signed)
Pharmacy Antibiotic Note  [name] is a 66 y.o. male w/ PMH of DM, HTN admitted on 05/16/2022 with cellulitis/osteomyelitis.  Pharmacy has been consulted for cefepime & vancomycin dosing. Renal function has continued to improve since    Plan:  1) continue cefepime 2 grams IV every 8 hours  2) continue vancomycin 1000 mg IV every 12 hours Goal AUC 400-550 Expected AUC: 485 SCr used: 0.80 mg/dL (rounded up) Ke 1.027O-5, T1/2 8.7h Daily serum creatinine while on IV vancomycin  Pharmacy will continue to follow and will adjust abx dosing whenever warranted.  Temp (24hrs), Avg:98.5 F (36.9 C), Min:98.1 F (36.7 C), Max:98.8 F (37.1 C)   Recent Labs  Lab 05/16/22 0133 05/16/22 0352 05/18/22 0605 05/19/22 0343  WBC 11.2*  --  10.7*  --   CREATININE 1.13  --  0.75 0.82  LATICACIDVEN 1.2 1.1  --   --      Estimated Creatinine Clearance: 106.3 mL/min (by C-G formula based on SCr of 0.82 mg/dL).    No Known Allergies  Antimicrobials this admission: 11/28 cefepime >>  11/28 vancomycin >>   Microbiology results: 11/28 BCx: NGTD 11/28 WoundCx: GPC pairs, GNR, GPR, E faecalis, P aeruginosa (final culture pending)  Thank you for allowing pharmacy to be a part of this patient's care.  Burnis Medin, PharmD, BCPS  05/19/2022 8:25 AM

## 2022-05-19 NOTE — Op Note (Signed)
   OPERATIVE NOTE   PROCEDURE: Left below-the-knee amputation  PRE-OPERATIVE DIAGNOSIS: Left foot gangrene  POST-OPERATIVE DIAGNOSIS: same as above  SURGEON: Renford Dills, MD  ASSISTANT(S): Rolla Plate, NP  ANESTHESIA: general  ESTIMATED BLOOD LOSS: 500 cc  FINDING(S): Healthy appearing muscle bellies  SPECIMEN(S):  Left below-the-knee amputation  INDICATIONS:   [name] is a 66 y.o. male who presents with left leg gangrene.  The patient is scheduled for a left below-the-knee amputation.  I discussed in depth with the patient the risks, benefits, and alternatives to this procedure.  The patient is aware that the risk of this operation included but are not limited to:  bleeding, infection, myocardial infarction, stroke, death, failure to heal amputation wound, and possible need for more proximal amputation.  The patient is aware of the risks and agrees proceed forward with the procedure.  DESCRIPTION:  After full informed written consent was obtained from the patient, the patient was brought back to the operating room, and placed supine upon the operating table.  Prior to induction, the patient received IV antibiotics.  The patient was then prepped and draped in the standard fashion for a below-the-knee amputation.  After obtaining adequate anesthesia, the patient was prepped and draped in the standard fashion for a left below-the-knee amputation.  I marked out the anterior incision two finger breadths below the tibial tuberosity and then the marked out a posterior flap that was one third of the circumference of the calf in length.   I made the incisions for these flaps, and then dissected through the subcutaneous tissue, fascia, and muscle anteriorly.  I elevated  the periosteal tissue superiorly so that the tibia was about 3-4 cm shorter than the anterior skin flap.  I then transected the tibia with a power saw and then took a wedge off the tibia anteriorly with the power saw.   Then I smoothed out the rough edges.  In a similar fashion, I cut back the fibula about two centimeters higher than the level of the tibia with a bone cutter.  I put a bone hook into the distal tibia and then used a large amputation knife to sharply develop a tissue plane through the muscle along the fibula.  In such fashion, the posterior flap was developed.  At this point, the specimen was passed off the field as the below-the-knee amputation.  At this point, I clamped all visibly bleeding arteries and veins using a combination of suture ligation with Silk suture and electrocautery.  Bleeding continued to be controlled with electrocautery and suture ligature.  The stump was washed off with sterile normal saline and no further active bleeding was noted.  I reapproximated the anterior and posterior fascia  with interrupted stitches of 0 Vicryl.  This was completed along the entire length of anterior and posterior fascia until there were no more loose space in the fascial line. I then placed a layer of 2-0 Vicryl sutures in the subcutaneous tissue. The skin was then  reapproximated with staples.  The stump was washed off and dried.  The incision was dressed with Xeroform and  then fluffs were applied.  Kerlix was wrapped around the leg and then gently an ACE wrap was applied.    COMPLICATIONS: none  CONDITION: stable   [name]  05/19/2022, 2:27 PM    This note was created with Dragon Medical transcription system. Any errors in dictation are purely unintentional.

## 2022-05-19 NOTE — Anesthesia Procedure Notes (Signed)
Procedure Name: Intubation Date/Time: 05/19/2022 12:42 PM  Performed by: Lynden Oxford, CRNAPre-anesthesia Checklist: Patient identified, Emergency Drugs available, Suction available and Patient being monitored Patient Re-evaluated:Patient Re-evaluated prior to induction Oxygen Delivery Method: Circle system utilized Preoxygenation: Pre-oxygenation with 100% oxygen Induction Type: IV induction Ventilation: Mask ventilation without difficulty Laryngoscope Size: McGraph and 4 Grade View: Grade II Tube type: Oral Tube size: 7.0 mm Number of attempts: 1 Airway Equipment and Method: Stylet, Oral airway and Video-laryngoscopy Placement Confirmation: ETT inserted through vocal cords under direct vision, positive ETCO2 and breath sounds checked- equal and bilateral Secured at: 21 cm Tube secured with: Tape Dental Injury: Teeth and Oropharynx as per pre-operative assessment

## 2022-05-20 ENCOUNTER — Encounter: Payer: Self-pay | Admitting: Radiology

## 2022-05-20 ENCOUNTER — Inpatient Hospital Stay: Payer: No Typology Code available for payment source

## 2022-05-20 DIAGNOSIS — L03116 Cellulitis of left lower limb: Secondary | ICD-10-CM | POA: Diagnosis not present

## 2022-05-20 LAB — GLUCOSE, CAPILLARY
Glucose-Capillary: 185 mg/dL — ABNORMAL HIGH (ref 70–99)
Glucose-Capillary: 189 mg/dL — ABNORMAL HIGH (ref 70–99)
Glucose-Capillary: 135 mg/dL — ABNORMAL HIGH (ref 70–99)
Glucose-Capillary: 172 mg/dL — ABNORMAL HIGH (ref 70–99)
Glucose-Capillary: 191 mg/dL — ABNORMAL HIGH (ref 70–99)
Glucose-Capillary: 162 mg/dL — ABNORMAL HIGH (ref 70–99)
Glucose-Capillary: 94 mg/dL (ref 70–99)

## 2022-05-20 LAB — CREATININE, SERUM
Creatinine, Ser: 1.07 mg/dL (ref 0.61–1.24)
GFR, Estimated: >60 mL/min (ref >60)

## 2022-05-20 MED ORDER — ENOXAPARIN SODIUM 60 MG/0.6ML IJ SOSY
0.5000 mg/kg | PREFILLED_SYRINGE | INTRAMUSCULAR | Status: DC
  Administered 2022-05-20 – 2022-05-22 (×3): 50 mg via SUBCUTANEOUS
  Filled 2022-05-20 (×3): qty 0.6

## 2022-05-20 MED ORDER — POLYETHYLENE GLYCOL 3350 17 G PO PACK
17.0000 g | PACK | Freq: Two times a day (BID) | ORAL | Status: DC | PRN
  Administered 2022-05-21: 17 g via ORAL
  Filled 2022-05-20: qty 1

## 2022-05-20 MED ORDER — ONDANSETRON HCL 4 MG/2ML IJ SOLN: 4.0000 mg | Freq: Four times a day (QID) | INTRAMUSCULAR | Status: DC | PRN

## 2022-05-20 MED ORDER — CYCLOBENZAPRINE HCL 10 MG PO TABS
10.0000 mg | ORAL_TABLET | Freq: Three times a day (TID) | ORAL | Status: DC | PRN
  Administered 2022-05-20 – 2022-05-21 (×2): 10 mg via ORAL
  Filled 2022-05-20 (×2): qty 1

## 2022-05-20 MED ORDER — HYDROMORPHONE HCL 1 MG/ML IJ SOLN: 1.0000 mg | Freq: Once | INTRAMUSCULAR | Status: DC | PRN

## 2022-05-20 NOTE — Progress Notes (Signed)
Pharmacy Antibiotic Note  [name] is a 66 y.o. male w/ PMH of DM, HTN admitted on 05/16/2022 with cellulitis/osteomyelitis.  Pharmacy has been consulted for cefepime & vancomycin dosing. Renal function has continued to improve since    11/28: Heel Wound cx: abundant Pseudomonas aeruginose, few Enterococcus faecalis 12/1: s/p L BKA  Plan:  1) continue cefepime 2 grams IV every 8 hours  2) continue vancomycin 1000 mg IV every 12 hours Goal AUC 400-550 Expected AUC: 455 SCr used: 1.07  Cmin 13.7 Daily serum creatinine while on IV vancomycin  Pharmacy will continue to follow and will adjust abx dosing whenever warranted.  Temp (24hrs), Avg:98.2 F (36.8 C), Min:96.9 F (36.1 C), Max:99 F (37.2 C)   Recent Labs  Lab 05/16/22 0133 05/16/22 0352 05/18/22 0605 05/19/22 0343 05/20/22 0452  WBC 11.2*  --  10.7*  --   --   CREATININE 1.13  --  0.75 0.82 1.07  LATICACIDVEN 1.2 1.1  --   --   --      Estimated Creatinine Clearance: 80.3 mL/min (by C-G formula based on SCr of 1.07 mg/dL).    No Known Allergies  Antimicrobials this admission: 11/28 cefepime >>  11/28 vancomycin >>   Microbiology results: 11/28 BCx: NGTD 11/28 WoundCx: GPC pairs, GNR, GPR, E faecalis pan sens, P aeruginosa pan sens  Thank you for allowing pharmacy to be a part of this patient's care.  Bari Mantis PharmD Clinical Pharmacist 05/20/2022

## 2022-05-20 NOTE — Progress Notes (Signed)
RN called to room by wife stating pt 'zoned out' and not responding appropriately. VSS. NIH score 0. MD made aware and came to bedside to assess pt. Will continue to monitor.

## 2022-05-20 NOTE — Evaluation (Addendum)
Occupational Therapy Evaluation Patient Details Name: [name] MRN: [id number] DOB: 07/15/1955 Today's Date: 05/20/2022   History of Present Illness Pt is a 66 y.o. male presenting to hospital 11/27 (sent by podiatrist for admission and IV antibiotics) for chronic nonhealing wound to L heel.  Pt admitted with cellulitis of L LE, chronic osteomyelitis with draining sinus (possible acute osteomyelitis), avulsion fx L calcaneus, hypertensive urgency, and atherosclerosis of native arteries of the extremities with ulceration.  S/p recanalization L LE 05/16/22 for limb salvage.  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  PMH includes DM, htn, viral meningitis.   Clinical Impression   Chart reviewed, pt greeted in chair agreeable to OT evaluation. Pt wife present throughout. Pt is alert and oriented x4. PTA pt was MOD I in ADL/IADL, amb with SPC due to balance deficits. Pt presents with deficits in endurance, activity tolerance, balance all affecting safe and optimal ADL completion. Education provided re: residual limb care, importance of progressing ADL/functional mobility status, preserve/improve BUE strength for functional transfers, LLE postioning to prevent knee contracture. STS completed with MIN A, LB dressing/clothing management completed with MOD A in STS, amb in room with RW with CGA-MIN A approx 8' with chair follow. Pt is left in bedside chair, all needs met. Recommend acute rehab to facilitate return to PLOF. OT will continue to follow acutely.      Recommendations for follow up therapy are one component of a multi-disciplinary discharge planning process, led by the attending physician.  Recommendations may be updated based on patient status, additional functional criteria and insurance authorization.   Follow Up Recommendations  Acute inpatient rehab (3hours/day)     Assistance Recommended at Discharge Frequent or constant Supervision/Assistance  Patient can return home with the following A little help with  walking and/or transfers;A little help with bathing/dressing/bathroom    Functional Status Assessment  Patient has had a recent decline in their functional status and demonstrates the ability to make significant improvements in function in a reasonable and predictable amount of time.  Equipment Recommendations  Wheelchair (measurements OT);Wheelchair cushion (measurements OT)    Recommendations for Other Services Rehab consult     Precautions / Restrictions Precautions Precautions: Fall Restrictions Weight Bearing Restrictions: Yes LLE Weight Bearing: Weight bearing as tolerated Other Position/Activity Restrictions: Full WB'ing R leg; NWB L LE s/p BKA      Mobility Bed Mobility               General bed mobility comments: NT in recliner pre/post session    Transfers Overall transfer level: Needs assistance Equipment used: Rolling walker (2 wheels) Transfers: Sit to/from Stand Sit to Stand: Min guard, Min assist           General transfer comment: frequent vcs for technique      Balance Overall balance assessment: Needs assistance   Sitting balance-Leahy Scale: Good     Standing balance support: Bilateral upper extremity supported, During functional activity Standing balance-Leahy Scale: Fair                             ADL either performed or assessed with clinical judgement   ADL Overall ADL's : Needs assistance/impaired Eating/Feeding: Set up;Sitting   Grooming: Set up           Upper Body Dressing : Minimal assistance Upper Body Dressing Details (indicate cue type and reason): anticipated Lower Body Dressing: Moderate assistance;Sit to/from stand Lower Body Dressing Details (indicate cue type  and reason): underwear Toilet Transfer: Min guard;Rolling walker (2 wheels) Toilet Transfer Details (indicate cue type and reason): simulated, intermittent vcs for technique Toileting- Clothing Manipulation and Hygiene: Moderate  assistance;Sit to/from stand       Functional mobility during ADLs: Min guard;Rolling walker (2 wheels);Minimal assistance (approx 6' in room with RW, close chair follow)       Vision Baseline Vision/History: 1 Wears glasses Patient Visual Report: No change from baseline       Perception     Praxis      Pertinent Vitals/Pain Pain Assessment Pain Assessment: No/denies pain     Hand Dominance     Extremity/Trunk Assessment Upper Extremity Assessment Upper Extremity Assessment: Overall WFL for tasks assessed   Lower Extremity Assessment Lower Extremity Assessment: LLE deficits/detail;Defer to PT evaluation LLE Deficits / Details: s/p L BKA LLE: Unable to fully assess due to pain   Cervical / Trunk Assessment Cervical / Trunk Assessment: Other exceptions Cervical / Trunk Exceptions: forward head/shoulders   Communication Communication Communication: No difficulties   Cognition Arousal/Alertness: Awake/alert Behavior During Therapy: WFL for tasks assessed/performed Overall Cognitive Status: Within Functional Limits for tasks assessed                                       General Comments  all lines/leads intact following intervention; vital signs appear stable throughout    Exercises Other Exercises Other Exercises: edu pt and wife re: role of OT, role of rehab, discharge recommendations, home safety, DME use, early residual limb care   Shoulder Instructions      Home Living Family/patient expects to be discharged to:: Private residence Living Arrangements: Spouse/significant other Available Help at Discharge: Family;Available PRN/intermittently Type of Home: House Home Access: Stairs to enter [employer] of Steps: 7 Entrance Stairs-Rails: Right;Left Home Layout: One level     Bathroom Shower/Tub: Occupational psychologist: Standard     Home Equipment: Rollator (4 wheels);BSC/3in1;Shower seat - built in;Grab bars -  tub/shower   Additional Comments: Has BSC over current toilet.  Pt's brother is bringing (on Monday) another [facility] and manual w/c for pt to use.      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        OT Problem List: Decreased activity tolerance;Impaired balance (sitting and/or standing);Decreased knowledge of use of DME or AE      OT Treatment/Interventions: Self-care/ADL training;Patient/family education;Therapeutic exercise;Therapeutic activities;DME and/or AE instruction    OT Goals(Current goals can be found in the care plan section) Acute Rehab OT Goals Patient Stated Goal: discharge home OT Goal Formulation: With patient/family Time For Goal Achievement: 06/03/22 Potential to Achieve Goals: Fair ADL Goals Pt Will Transfer to Toilet: with modified independence Pt Will Perform Toileting - Clothing Manipulation and hygiene: with modified independence;sit to/from stand  OT Frequency: Min 2X/week    Co-evaluation              AM-PAC OT "6 Clicks" Daily Activity     Outcome Measure Help from another person eating meals?: None Help from another person taking care of personal grooming?: None Help from another person toileting, which includes using toliet, bedpan, or urinal?: A Lot Help from another person bathing (including  washing, rinsing, drying)?: A Lot Help from another person to put on and taking off regular upper body clothing?: A Little Help from another person to put on and taking off regular lower body clothing?: A Lot 6 Click Score: 17   End of Session Equipment Utilized During Treatment: Rolling walker (2 wheels) Nurse Communication: Mobility status  Activity Tolerance: Patient tolerated treatment well Patient left: in chair;with call bell/phone within reach;with chair alarm set  OT  Visit Diagnosis: Unsteadiness on feet (R26.81)                Time: 4270-6237 OT Time Calculation (min): 15 min Charges:  OT General Charges $OT Visit: 1 Visit OT Evaluation $OT Eval Moderate Complexity: 1 Mod  Shanon Payor, OTD OTR/L  05/20/22, 3:34 PM

## 2022-05-20 NOTE — Evaluation (Signed)
Physical Therapy Evaluation Patient Details Name: [name] MRN: [id number] DOB: 1955/08/12 Today's Date: 05/20/2022  History of Present Illness  Pt is a 66 y.o. male presenting to hospital 11/27 (sent by podiatrist for admission and IV antibiotics) for chronic nonhealing wound to L heel.  Pt admitted with cellulitis of L LE, chronic osteomyelitis with draining sinus (possible acute osteomyelitis), avulsion fx L calcaneus, hypertensive urgency, and atherosclerosis of native arteries of the extremities with ulceration.  S/p recanalization L LE 05/16/22 for limb salvage.  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  PMH includes DM, htn, viral meningitis.  Clinical Impression  Prior to hospital admission, pt was modified independent ambulating with [facility]; lives with his wife in 1 level home with 5 STE B railings.  Minimal to no c/o L LE residual limb pain during session.  Currently pt is min assist with transfers and to ambulate a few feet bed to recliner with RW use.  Pt requiring assist for balance for transfers and ambulation as well as cueing for technique.  Pt would benefit from skilled PT to address noted impairments and functional limitations (see below for any additional details).  Upon hospital discharge, pt would benefit from inpatient rehab.    Recommendations for follow up therapy are one component of a multi-disciplinary discharge planning process, led by the attending physician.  Recommendations may be updated based on patient status, additional functional criteria and insurance authorization.  Follow Up Recommendations Acute inpatient rehab (3hours/day)      Assistance Recommended at Discharge Frequent or constant Supervision/Assistance  Patient can return home with the following  A lot of help with walking and/or transfers;A lot of help with bathing/dressing/bathroom;Assistance with cooking/housework;Assist for transportation;Help with stairs or ramp for entrance    Equipment Recommendations Rolling walker  (2 wheels);BSC/3in1;Wheelchair (measurements PT);Wheelchair cushion (measurements PT)  Recommendations for Other Services  OT consult    Functional Status Assessment Patient has had a recent decline in their functional status and demonstrates the ability to make significant improvements in function in a reasonable and predictable amount of time.     Precautions / Restrictions Precautions Precautions: Fall Restrictions Weight Bearing Restrictions: Yes LLE Weight Bearing: Non weight bearing Other Position/Activity Restrictions: Full WB'ing R leg; NWB L LE s/p BKA      Mobility  Bed Mobility               General bed mobility comments: Deferred (pt sitting on edge of bed finishing breakfast upon PT arrival)    Transfers Overall transfer level: Needs assistance Equipment used: Rolling walker (2 wheels) Transfers: Sit to/from Stand Sit to Stand: Min assist           General transfer comment: vc's for UE/LE placement; assist for balance with transfers; vc's for walker use    Ambulation/Gait Ambulation/Gait assistance: Min assist Gait Distance (Feet): 3 Feet (bed to recliner) Assistive device: Rolling walker (2 wheels)         General Gait Details: vc's for walker use and controlled "hopping"; assist to steady and cueing to slow down  Stairs            Wheelchair Mobility    Modified Rankin (Stroke Patients Only)       Balance Overall balance assessment: Needs assistance Sitting-balance support: No upper extremity supported (R LE supported) Sitting balance-Leahy Scale: Good Sitting balance - Comments: steady sitting reaching within BOS   Standing balance support: Bilateral upper extremity supported, During functional activity Standing balance-Leahy Scale: Poor Standing balance comment: assist  to steady with standing activities utilizing RW                             Pertinent Vitals/Pain Pain Assessment Pain Assessment: No/denies  pain Vitals (HR and O2 on room air) stable and WFL throughout treatment session.    Home Living Family/patient expects to be discharged to:: Private residence Living Arrangements: Spouse/significant other Available Help at Discharge: Family;Available PRN/intermittently Type of Home: House Home Access: Stairs to enter Entrance Stairs-Rails: Doctor, general practice of Steps: 5   Home Layout: One level Home Equipment: Rollator (4 wheels);BSC/3in1;Shower seat - built in;Grab bars - tub/shower Additional Comments: Has BSC over current toilet.  Pt's brother is bringing (on Monday) another [facility] and manual w/c for pt to use.    Prior Function Prior Level of Function : Independent/Modified Independent             Mobility Comments: Ambulatory with SPC (for balance).  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       Hand Dominance        Extremity/Trunk Assessment   Upper Extremity Assessment Upper Extremity Assessment: Overall WFL for tasks assessed (strong B hand grip strength; 5/5 B elbow flexion/extension and shoulder flexion)    Lower Extremity Assessment Lower Extremity Assessment: LLE deficits/detail (R LE WFL) LLE Deficits / Details: at least 3/5 hip flexion and knee flexion/extension AROM; knee extension AROM to grossly 5 degrees short of neutral; knee flexion AROM grossly to 90 degrees LLE: Unable to fully assess due to pain    Cervical / Trunk Assessment Cervical / Trunk Assessment: Other exceptions Cervical / Trunk Exceptions: forward head/shoulders  Communication   Communication: No difficulties  Cognition Arousal/Alertness: Awake/alert Behavior During Therapy: WFL for tasks assessed/performed Overall Cognitive Status: Within Functional Limits for tasks assessed                                          General Comments General comments (skin integrity, edema, etc.): no drainage noted from ace wrap L LE surgical  site.  Nursing cleared pt for participation in physical therapy.  Pt agreeable to PT session.    Exercises General Exercises - Lower Extremity Quad Sets: AROM, Strengthening, Left, 10 reps, Supine Other Exercises Other Exercises: L knee flexion/extension AROM x10 reps semi-supine in recliner   Assessment/Plan    PT Assessment Patient needs continued PT services  PT Problem List Decreased strength;Decreased range of motion;Decreased activity tolerance;Decreased balance;Decreased mobility;Decreased knowledge of use of DME;Decreased knowledge of precautions;Decreased skin integrity;Pain       PT Treatment Interventions DME instruction;Gait training;Stair training;Functional mobility training;Therapeutic activities;Therapeutic exercise;Balance training;Patient/family education    PT Goals (Current goals can be found in the Care Plan section)  Acute Rehab PT Goals Patient Stated Goal: to improve mobility PT Goal Formulation: With patient Time For Goal Achievement: 06/03/22 Potential to Achieve Goals: Good    Frequency 7X/week     Co-evaluation               AM-PAC PT "6 Clicks" Mobility  Outcome Measure Help needed turning from your back to your side while in a flat bed without using bedrails?: None Help needed moving from lying on your back to sitting on the side of a flat bed without using bedrails?: A Little Help needed moving to and  from a bed to a chair (including a wheelchair)?: A Little Help needed standing up from a chair using your arms (e.g., wheelchair or bedside chair)?: A Little Help needed to walk in hospital room?: A Lot Help needed climbing 3-5 steps with a railing? : Total 6 Click Score: 16    End of Session Equipment Utilized During Treatment: Gait belt Activity Tolerance: Patient tolerated treatment well Patient left: in chair;with call bell/phone within reach;with chair alarm set;Other (comment) (L LE residual limb elevated on pillow; R heel floating  via pillow support) Nurse Communication: Mobility status;Precautions;Weight bearing status PT Visit Diagnosis: Unsteadiness on feet (R26.81);Other abnormalities of gait and mobility (R26.89);Muscle weakness (generalized) (M62.81);Pain Pain - Right/Left: Left Pain - part of body: Leg    Time: 9030-0923 PT Time Calculation (min) (ACUTE ONLY): 43 min   Charges:   PT Evaluation $PT Eval Low Complexity: 1 Low PT Treatments $Therapeutic Activity: 23-37 mins       Hendricks Limes, PT 05/20/22, 12:40 PM

## 2022-05-20 NOTE — Progress Notes (Signed)
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   Patient has seen podiatrist on 05/15/22 with acute foot pain and x-rays revealed an avulsion fracture of calcaneum raising the concern for possible acute osteomyelitis.  Patient was started on doxycycline for possible wound infection.  Vascular surgery consulted, Patient underwent successful recanalization left lower extremity for limb salvage with two-vessel runoff.  vascular surgery performed below-knee amputation on 12/1.  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 MRI : osteomyelitis involving the posterior aspect of the calcaneus.  Initiated on empiric cefepime and vancomycin. Wound culture grow Pseudomonas aeruginosa, susceptible to cefepime.   Plan: --cont vanc and cefepime for now, for 2 days after amputation --PT/OT rec acute inpatient rehab  Hypertensive urgency: BP 230/83 on arrival, now improved --cont amlodipine and Toprol   Atherosclerosis of native arteries of the extremities with ulceration (HCC) --cont ASA and plavix   Hypothyroidism: Continue levothyroxine   Type 2 diabetes with hyperglycemia and with diabetic neuropathy: --ACHS and SSI for now --obtain A1c  Transient AMS with aphasia --3 brief episodes noted by family and 1 by RN.   All completely resolved within minutes.  No hx of seizure.  Family very concerned about stroke.  Pt gave inconsistent accounts of what happened.  Did not initiate code stroke, because symptoms transient and quickly self-resolved, so there would be no indication for thrombolytics even if pt does have an acute stroke.   --MRI brain.  If neg, and pt continues to have episodes, consider EEG tomorrow.    DVT prophylaxis: Lovenox SQ Code Status: Full code  Family Communication: wife updated at the bedside today Level of care: Med-Surg Dispo:   The patient is from: home Anticipated d/c is to: CIR Anticipated d/c date is: undetermined   Subjective and Interval History:  Pt reported cramps around his stump site.    In the afternoon, family reported 3 episodes of pt sudden change in mental status.  RN witnessed the last of the 3 episodes, and reported pt suddenly did not respond to her and couldn't talk for about 30 sec, and then back to normal just as suddenly.  Pt himself gave inconsistent accounts of what happened.     No hx of seizure.  Family concerned about stroke.  MRI brain ordered.   Objective: Vitals:   05/20/22 0803 05/20/22 1605 05/20/22 1706 05/20/22 1907  BP: 138/60 (!) 108/51 (!) 123/56 (!) 123/59  Pulse: 94 94 89 86  Resp: 18 18 18 16   Temp: 98.1 F (36.7 C) 98.3 F (36.8 C) 99.6 F (37.6 C) 98.6 F (37 C)  TempSrc:  Oral Oral Oral  SpO2: 99% 100% 100% 100%  Weight:      Height:        Intake/Output Summary (Last 24 hours) at 05/20/2022 1911 Last data filed at 05/20/2022 1521 Gross per 24 hour  Intake 905.9  ml  Output 1100 ml  Net -194.1 ml   Filed Weights   05/15/22 1911 05/19/22 1121 05/20/22 0414  Weight: 104 kg 104.3 kg 100.9 kg    Examination:   Constitutional: NAD, AAOx3 HEENT: conjunctivae and lids normal, EOMI CV: No cyanosis.   RESP: normal respiratory effort, on RA Extremities: left BKA SKIN: warm, dry Neuro: II - XII grossly intact.   Psych:  Normal mood and affect.  Appropriate judgement and reason   Data Reviewed: I have personally reviewed labs and imaging studies  Time spent: 50 minutes  Darlin Priestly, MD [facility] If 7PM-7AM, please contact night-coverage 05/20/2022, 7:11 PM

## 2022-05-21 DIAGNOSIS — S92032B Displaced avulsion fracture of tuberosity of left calcaneus, initial encounter for open fracture: Secondary | ICD-10-CM | POA: Diagnosis not present

## 2022-05-21 DIAGNOSIS — I160 Hypertensive urgency: Secondary | ICD-10-CM

## 2022-05-21 DIAGNOSIS — E1140 Type 2 diabetes mellitus with diabetic neuropathy, unspecified: Secondary | ICD-10-CM | POA: Diagnosis not present

## 2022-05-21 DIAGNOSIS — R4182 Altered mental status, unspecified: Secondary | ICD-10-CM | POA: Insufficient documentation

## 2022-05-21 DIAGNOSIS — I96 Gangrene, not elsewhere classified: Secondary | ICD-10-CM | POA: Diagnosis not present

## 2022-05-21 LAB — AEROBIC/ANAEROBIC CULTURE W GRAM STAIN (SURGICAL/DEEP WOUND)

## 2022-05-21 LAB — BASIC METABOLIC PANEL
Anion gap: 7 (ref 5–15)
BUN: 26 mg/dL — ABNORMAL HIGH (ref 8–23)
CO2: 18 mmol/L — ABNORMAL LOW (ref 22–32)
Calcium: 7.9 mg/dL — ABNORMAL LOW (ref 8.9–10.3)
Chloride: 111 mmol/L (ref 98–111)
Creatinine, Ser: 1.16 mg/dL (ref 0.61–1.24)
GFR, Estimated: >60 mL/min (ref >60)
Glucose, Bld: 99 mg/dL (ref 70–99)
Potassium: 3.5 mmol/L (ref 3.5–5.1)
Sodium: 136 mmol/L (ref 135–145)

## 2022-05-21 LAB — CBC
HCT: 21 % — ABNORMAL LOW (ref 39.0–52.0)
Hemoglobin: 7.2 g/dL — ABNORMAL LOW (ref 13.0–17.0)
MCH: 31.6 pg (ref 26.0–34.0)
MCHC: 34.3 g/dL (ref 30.0–36.0)
MCV: 92.1 fL (ref 80.0–100.0)
Platelets: 202 10*3/uL (ref 150–400)
RBC: 2.28 MIL/uL — ABNORMAL LOW (ref 4.22–5.81)
RDW: 12.7 % (ref 11.5–15.5)
WBC: 9 10*3/uL (ref 4.0–10.5)
nRBC: 0 % (ref 0.0–0.2)

## 2022-05-21 LAB — CULTURE, BLOOD (ROUTINE X 2)
Culture: NO GROWTH
Culture: NO GROWTH
Special Requests: ADEQUATE
Special Requests: ADEQUATE

## 2022-05-21 LAB — GLUCOSE, CAPILLARY
Glucose-Capillary: 92 mg/dL (ref 70–99)
Glucose-Capillary: 134 mg/dL — ABNORMAL HIGH (ref 70–99)
Glucose-Capillary: 125 mg/dL — ABNORMAL HIGH (ref 70–99)
Glucose-Capillary: 119 mg/dL — ABNORMAL HIGH (ref 70–99)
Glucose-Capillary: 154 mg/dL — ABNORMAL HIGH (ref 70–99)
Glucose-Capillary: 113 mg/dL — ABNORMAL HIGH (ref 70–99)

## 2022-05-21 LAB — MAGNESIUM: Magnesium: 2.1 mg/dL (ref 1.7–2.4)

## 2022-05-21 MED ORDER — AMOXICILLIN 500 MG PO CAPS
500.0000 mg | ORAL_CAPSULE | Freq: Three times a day (TID) | ORAL | Status: DC
  Administered 2022-05-21 – 2022-05-23 (×7): 500 mg via ORAL
  Filled 2022-05-21 (×7): qty 1

## 2022-05-21 MED ORDER — CIPROFLOXACIN HCL 500 MG PO TABS
500.0000 mg | ORAL_TABLET | Freq: Two times a day (BID) | ORAL | Status: DC
  Administered 2022-05-21 – 2022-05-23 (×4): 500 mg via ORAL
  Filled 2022-05-21 (×4): qty 1

## 2022-05-21 NOTE — Assessment & Plan Note (Signed)
Was transient and resolved. He is at his baseline MS.  MRI brain - neg on 12/2

## 2022-05-21 NOTE — Progress Notes (Signed)
Physical Therapy Treatment Patient Details Name: [name] MRN: [id number] DOB: [birth year] Today's Date: 05/21/2022   History of Present Illness Pt is a 66 y.o. male presenting to hospital 11/27 (sent by podiatrist for admission and IV antibiotics) for chronic nonhealing wound to L heel.  Pt admitted with cellulitis of L LE, chronic osteomyelitis with draining sinus (possible acute osteomyelitis), avulsion fx L calcaneus, hypertensive urgency, and atherosclerosis of native arteries of the extremities with ulceration.  S/p recanalization L LE 05/16/22 for limb salvage.  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  PMH includes DM, htn, viral meningitis.   PT Comments    Pt sitting on edge of bed upon PT arrival; NT present; pt requesting to toilet.  Pt able to stand from elevated bed height and then transfer bed to [facility] with RW all with min to mod assist x1.  Pt appearing with generalized weakness and increased difficulty clearing floor to advance R LE today with standing activities requiring increased time and cueing to perform tasks.  Pt left on BSC end of session with pt's NT and pt's wife present (pt reporting he was going to need a while on [facility]).  Attempted to see pt again this afternoon for longer therapy session but NT reports pt had just gotten on BSC (pt did better than this morning) and when therapist checked on pt, pt reporting needing a lot more time on BSC.  Will plan to see pt again tomorrow.   Recommendations for follow up therapy are one component of a multi-disciplinary discharge planning process, led by the attending physician.  Recommendations may be updated based on patient status, additional functional criteria and insurance authorization.  Follow Up Recommendations  Acute inpatient rehab (3hours/day)     Assistance Recommended at Discharge Frequent or constant Supervision/Assistance  Patient can return home with the following A lot of help with walking and/or transfers;A lot of help with  bathing/dressing/bathroom;Assistance with cooking/housework;Assist for transportation;Help with stairs or ramp for entrance   Equipment Recommendations  Rolling walker (2 wheels);BSC/3in1;Wheelchair (measurements PT);Wheelchair cushion (measurements PT)    Recommendations for Other Services OT consult     Precautions / Restrictions Precautions Precautions: Fall Restrictions Weight Bearing Restrictions: Yes LLE Weight Bearing: Non weight bearing Other Position/Activity Restrictions: Full WB'ing R leg; NWB L LE s/p BKA     Mobility  Bed Mobility               General bed mobility comments: Deferred (pt sitting on edge of bed upon PT arrival)    Transfers Overall transfer level: Needs assistance Equipment used: Rolling walker (2 wheels) Transfers: Sit to/from Stand Sit to Stand: Min assist, Mod assist, From elevated surface           General transfer comment: min to mod assist to come to stand from elevated bed height; vc's for UE/LE placement and overall technique.  For transfer to [facility]:  vc's for walker use and controlled "hopping"; assist to steady; vc's for overall technique; decreased R LE foot clearance noted/increased difficulty clearing R foot from floor noted.    Ambulation/Gait               General Gait Details: Deferred d/t pt needing to use [facility]   Stairs             Wheelchair Mobility    Modified Rankin (Stroke Patients Only)       Balance Overall balance assessment: Needs assistance Sitting-balance support: No upper extremity supported (R LE supported) Sitting balance-Leahy  Scale: Good Sitting balance - Comments: steady sitting reaching within BOS   Standing balance support: Bilateral upper extremity supported, During functional activity Standing balance-Leahy Scale: Fair Standing balance comment: assist to steady with standing activities utilizing RW                            Cognition Arousal/Alertness:  Awake/alert Behavior During Therapy: WFL for tasks assessed/performed Overall Cognitive Status: Within Functional Limits for tasks assessed                                          Exercises      General Comments  Nursing cleared pt for participation in physical therapy.  Pt agreeable to PT session.      Pertinent Vitals/Pain Pain Assessment Pain Assessment: Faces Faces Pain Scale: Hurts a little bit Pain Location: L BKA Pain Descriptors / Indicators: Discomfort Pain Intervention(s): Limited activity within patient's tolerance, Monitored during session, Repositioned    Home Living                          Prior Function            PT Goals (current goals can now be found in the care plan section) Acute Rehab PT Goals Patient Stated Goal: to improve mobility PT Goal Formulation: With patient Time For Goal Achievement: 06/03/22 Potential to Achieve Goals: Good Progress towards PT goals: Progressing toward goals    Frequency    7X/week      PT Plan Current plan remains appropriate    Co-evaluation              AM-PAC PT "6 Clicks" Mobility   Outcome Measure  Help needed turning from your back to your side while in a flat bed without using bedrails?: None Help needed moving from lying on your back to sitting on the side of a flat bed without using bedrails?: A Little Help needed moving to and from a bed to a chair (including a wheelchair)?: A Lot Help needed standing up from a chair using your arms (e.g., wheelchair or bedside chair)?: A Lot Help needed to walk in hospital room?: Total Help needed climbing 3-5 steps with a railing? : Total 6 Click Score: 13    End of Session Equipment Utilized During Treatment: Gait belt Activity Tolerance: Patient tolerated treatment well Patient left:  (on BSC; NT and pt's wife present) Nurse Communication: Mobility status;Precautions;Weight bearing status PT Visit Diagnosis: Unsteadiness  on feet (R26.81);Other abnormalities of gait and mobility (R26.89);Muscle weakness (generalized) (M62.81);Pain Pain - Right/Left: Left Pain - part of body: Leg     Time: 6761-9509 PT Time Calculation (min) (ACUTE ONLY): 8 min  Charges:  $Therapeutic Activity: 8-22 mins                     Hendricks Limes, PT 05/21/22, 3:01 PM

## 2022-05-21 NOTE — Anesthesia Postprocedure Evaluation (Signed)
Anesthesia Post Note  Patient: [name]  Procedure(s) Performed: AMPUTATION BELOW KNEE (Left: Knee)  Patient location during evaluation: PACU Anesthesia Type: General Level of consciousness: awake and alert Pain management: pain level controlled Vital Signs Assessment: post-procedure vital signs reviewed and stable Respiratory status: spontaneous breathing, nonlabored ventilation, respiratory function stable and patient connected to nasal cannula oxygen Cardiovascular status: blood pressure returned to baseline and stable Postop Assessment: no apparent nausea or vomiting Anesthetic complications: no   No notable events documented.   Last Vitals:  Vitals:   05/21/22 0410 05/21/22 0720  BP: (!) 126/58 (!) 122/59  Pulse: 83 86  Resp: 19 18  Temp: 37.4 C 37.3 C  SpO2: 98% 100%    Last Pain:  Vitals:   05/21/22 0720  TempSrc: Oral  PainSc:                  [name]

## 2022-05-21 NOTE — TOC CM/SW Note (Signed)
Awaiting CIR eval. Reached out to CIR Representative.  [name], [place] [phone]

## 2022-05-21 NOTE — Plan of Care (Signed)
  Problem: Clinical Measurements: Goal: Ability to avoid or minimize complications of infection will improve Outcome: Progressing   Problem: Skin Integrity: Goal: Skin integrity will improve Outcome: Progressing   Problem: Education: Goal: Knowledge of General Education information will improve Description: Including pain rating scale, medication(s)/side effects and non-pharmacologic comfort measures Outcome: Progressing   Problem: Health Behavior/Discharge Planning: Goal: Ability to manage health-related needs will improve Outcome: Progressing   Problem: Clinical Measurements: Goal: Ability to maintain clinical measurements within normal limits will improve Outcome: Progressing Goal: Will remain free from infection Outcome: Progressing Goal: Diagnostic test results will improve Outcome: Progressing Goal: Respiratory complications will improve Outcome: Progressing Goal: Cardiovascular complication will be avoided Outcome: Progressing   Problem: Activity: Goal: Risk for activity intolerance will decrease Outcome: Progressing   Problem: Nutrition: Goal: Adequate nutrition will be maintained Outcome: Progressing   Problem: Pain Managment: Goal: General experience of comfort will improve Outcome: Progressing   Problem: Safety: Goal: Ability to remain free from injury will improve Outcome: Progressing   Problem: Activity: Goal: Ability to return to baseline activity level will improve Outcome: Progressing   Problem: Cardiovascular: Goal: Ability to achieve and maintain adequate cardiovascular perfusion will improve Outcome: Progressing Goal: Vascular access site(s) Level 0-1 will be maintained Outcome: Progressing   Problem: Health Behavior/Discharge Planning: Goal: Ability to safely manage health-related needs after discharge will improve Outcome: Progressing

## 2022-05-21 NOTE — Progress Notes (Signed)
Inpatient Rehab Admissions Coordinator Note:   Per therapy patient was screened for CIR candidacy by Esiah Bazinet Luvenia Starch, CCC-SLP. At this time, pt appears to be a potential candidate for CIR. I will place an order for rehab consult for full assessment, per our protocol.  Please contact me any with questions.Wolfgang Phoenix, MS, CCC-SLP Admissions Coordinator [phone] 6:05 PM

## 2022-05-21 NOTE — Progress Notes (Signed)
    Subjective  - POD #2, s/p left BKA  Pain is adequately controlled   Physical Exam:  Dressing dry Able to fully extend knee       Assessment/Plan:  POD #2  Stressed the importance of knee exercises and demonstrated how to perform them -plan for dressing change Monday or Tuesday  Wells Paulla Mcclaskey 05/21/2022 5:24 PM --  Vitals:   05/21/22 0720 05/21/22 1542  BP: (!) 122/59 (!) 104/47  Pulse: 86 92  Resp: 18 18  Temp: 99.2 F (37.3 C) 98.1 F (36.7 C)  SpO2: 100% 99%    Intake/Output Summary (Last 24 hours) at 05/21/2022 1724 Last data filed at 05/21/2022 1039 Gross per 24 hour  Intake --  Output 800 ml  Net -800 ml     Laboratory CBC    Component Value Date/Time   WBC 9.0 05/21/2022 0555   HGB 7.2 (L) 05/21/2022 0555   HCT 21.0 (L) 05/21/2022 0555   PLT 202 05/21/2022 0555    BMET    Component Value Date/Time   NA 136 05/21/2022 0555   K 3.5 05/21/2022 0555   CL 111 05/21/2022 0555   CO2 18 (L) 05/21/2022 0555   GLUCOSE 99 05/21/2022 0555   BUN 26 (H) 05/21/2022 0555   CREATININE 1.16 05/21/2022 0555   CREATININE 1.31 (H) 03/15/2020 1700   CALCIUM 7.9 (L) 05/21/2022 0555   GFRNONAA >60 05/21/2022 0555   GFRAA >60 01/10/2020 1107    COAG No results found for: "INR", "PROTIME" No results found for: "PTT"  Antibiotics Anti-infectives (From admission, onward)    Start     Dose/Rate Route Frequency Ordered Stop   05/21/22 2000  ciprofloxacin (CIPRO) tablet 500 mg        500 mg Oral 2 times daily 05/21/22 1036     05/21/22 1400  amoxicillin (AMOXIL) capsule 500 mg        500 mg Oral Every 8 hours 05/21/22 1036     05/18/22 1800  vancomycin (VANCOCIN) IVPB 1000 mg/200 mL premix  Status:  Discontinued        1,000 mg 200 mL/hr over 60 Minutes Intravenous Every 12 hours 05/18/22 1300 05/21/22 1036   05/17/22 1800  vancomycin (VANCOREADY) IVPB 750 mg/150 mL  Status:  Discontinued        750 mg 150 mL/hr over 60 Minutes Intravenous Every 12  hours 05/17/22 1310 05/18/22 1300   05/16/22 2200  vancomycin (VANCOREADY) IVPB 2000 mg/400 mL  Status:  Discontinued        2,000 mg 200 mL/hr over 120 Minutes Intravenous Every 24 hours 05/16/22 0316 05/17/22 1310   05/16/22 1230  ceFEPIme (MAXIPIME) 2 g in sodium chloride 0.9 % 100 mL IVPB  Status:  Discontinued        2 g 200 mL/hr over 30 Minutes Intravenous Every 8 hours 05/16/22 0316 05/21/22 1036   05/16/22 0230  vancomycin (VANCOREADY) IVPB 2000 mg/400 mL        2,000 mg 200 mL/hr over 120 Minutes Intravenous  Once 05/16/22 0226 05/16/22 0545   05/16/22 0230  ceFEPIme (MAXIPIME) 2 g in sodium chloride 0.9 % 100 mL IVPB        2 g 200 mL/hr over 30 Minutes Intravenous  Once 05/16/22 0226 05/16/22 0340        V. Charlena Cross, M.D., [facility] [place] Office: [phone] Pager:  [phone]

## 2022-05-21 NOTE — Progress Notes (Signed)
  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  Patient has seen podiatrist on 05/15/22 with acute foot pain and x-rays revealed an avulsion fracture of calcaneum raising the concern for possible acute osteomyelitis.  Patient was started on doxycycline for possible wound infection. Patient has also seen vascular surgery on 11/16 with recommendation for left leg angiography.  Patient is admitted for further evaluation.  Vascular surgery consulted , Patient underwent successful recanalization left lower extremity for limb salvage with two-vessel runoff.  Patient tolerated procedure well.  Podiatry and vascular surgery has scheduled for below-knee amputation on 12/1.  12/1: lt BKA amputation 12/2: PT, OT - CIR, MRI brain - neg 12/3: Abx changed to PO Cipro + Amoxy  Assessment and Plan: * Gangrene of left foot (HCC) Acute on Chronic osteomyelitis with draining sinus of LLE Avulsion fracture of left calcaneus  MRI left foot confirmed osteomyelitis involving the posterior aspect of the calcaneous. S/p left BKA by vascular surgery on 05/19/22 Abx changed to PO Cipro + Amoxy on 12/3 Follow wound cultures Vascular surgery following  Avulsion fracture of left calcaneus-resolved as of 05/21/2022 Patient was placed in a boot by podiatry on 11/27 Continue Pain mgmt  AMS (altered mental status) Was transient and resolved. He is at his baseline MS.  MRI brain - neg on 12/2  Hypertensive urgency BP 230/83 on arrival - now resolved. Continue home  metoprolol, Amlodipine & Hydralazine IV as needed  Hypothyroidism Continue levothyroxine  Type 2 diabetes mellitus with diabetic neuropathy, unspecified (HCC) Sliding scale insulin coverage for now. A1c 6.6 (4 months ago)        Subjective: Had some questions around phantom pain on his amputation site.  Discussed MRI results.  He denies any other new symptoms.  Looking forward to work with Therapy  Physical Exam: Vitals:   05/20/22 1907 05/21/22 0410 05/21/22 0720 05/21/22 1542  BP: (!) 123/59 (!) 126/58 (!) 122/59 (!) 104/47  Pulse: 86 83 86 92  Resp: 16 19 18 18   Temp: 98.6 F (37 C) 99.3 F (37.4 C) 99.2 F (37.3 C) 98.1 F (36.7 C)  TempSrc: Oral  Oral Oral  SpO2: 100% 98% 100% 99%  Weight:      Height:       Constitutional: NAD, AAOx3 HEENT: conjunctivae and lids normal, EOMI CV: No cyanosis.   RESP: normal respiratory effort, on RA Extremities: left BKA SKIN: warm, dry Neuro: II - XII grossly intact.   Psych: Normal mood and affect.  Appropriate judgement and reason Data Reviewed:  MRI brain showing no acute pathology  Family Communication: Wife updated at bedside  Disposition: Status is: Inpatient Remains inpatient appropriate because: Postop recovery status post left BKA on 12/1.  Waiting for CIR evaluation  Planned Discharge Destination:  CIR   DVT prophylaxis-SCDs Time spent: 35 minutes  Author: 14/1, MD 05/21/2022 4:22 PM  For on call review [email or web address].

## 2022-05-22 ENCOUNTER — Encounter: Payer: Self-pay | Admitting: Vascular Surgery

## 2022-05-22 DIAGNOSIS — E1140 Type 2 diabetes mellitus with diabetic neuropathy, unspecified: Secondary | ICD-10-CM | POA: Diagnosis not present

## 2022-05-22 DIAGNOSIS — I96 Gangrene, not elsewhere classified: Secondary | ICD-10-CM | POA: Diagnosis not present

## 2022-05-22 DIAGNOSIS — I160 Hypertensive urgency: Secondary | ICD-10-CM | POA: Diagnosis not present

## 2022-05-22 DIAGNOSIS — S92032B Displaced avulsion fracture of tuberosity of left calcaneus, initial encounter for open fracture: Secondary | ICD-10-CM | POA: Diagnosis not present

## 2022-05-22 LAB — CBC
HCT: 23.4 % — ABNORMAL LOW (ref 39.0–52.0)
Hemoglobin: 7.8 g/dL — ABNORMAL LOW (ref 13.0–17.0)
MCH: 31.8 pg (ref 26.0–34.0)
MCHC: 33.3 g/dL (ref 30.0–36.0)
MCV: 95.5 fL (ref 80.0–100.0)
Platelets: 221 10*3/uL (ref 150–400)
RBC: 2.45 MIL/uL — ABNORMAL LOW (ref 4.22–5.81)
RDW: 12.7 % (ref 11.5–15.5)
WBC: 13 10*3/uL — ABNORMAL HIGH (ref 4.0–10.5)
nRBC: 0 % (ref 0.0–0.2)

## 2022-05-22 LAB — HEMOGLOBIN A1C
Hgb A1c MFr Bld: 6.7 % — ABNORMAL HIGH (ref 4.8–5.6)
Mean Plasma Glucose: 146 mg/dL

## 2022-05-22 LAB — BASIC METABOLIC PANEL
Anion gap: 10 (ref 5–15)
BUN: 31 mg/dL — ABNORMAL HIGH (ref 8–23)
CO2: 17 mmol/L — ABNORMAL LOW (ref 22–32)
Calcium: 7.8 mg/dL — ABNORMAL LOW (ref 8.9–10.3)
Chloride: 109 mmol/L (ref 98–111)
Creatinine, Ser: 1.45 mg/dL — ABNORMAL HIGH (ref 0.61–1.24)
GFR, Estimated: 54 mL/min — ABNORMAL LOW (ref >60)
Glucose, Bld: 133 mg/dL — ABNORMAL HIGH (ref 70–99)
Potassium: 3.6 mmol/L (ref 3.5–5.1)
Sodium: 136 mmol/L (ref 135–145)

## 2022-05-22 LAB — GLUCOSE, CAPILLARY
Glucose-Capillary: 109 mg/dL — ABNORMAL HIGH (ref 70–99)
Glucose-Capillary: 123 mg/dL — ABNORMAL HIGH (ref 70–99)
Glucose-Capillary: 127 mg/dL — ABNORMAL HIGH (ref 70–99)
Glucose-Capillary: 127 mg/dL — ABNORMAL HIGH (ref 70–99)
Glucose-Capillary: 148 mg/dL — ABNORMAL HIGH (ref 70–99)
Glucose-Capillary: 151 mg/dL — ABNORMAL HIGH (ref 70–99)

## 2022-05-22 MED ORDER — ALUM & MAG HYDROXIDE-SIMETH 200-200-20 MG/5ML PO SUSP
15.0000 mL | Freq: Four times a day (QID) | ORAL | Status: DC | PRN
  Administered 2022-05-23: 15 mL via ORAL
  Filled 2022-05-22: qty 30

## 2022-05-22 NOTE — Assessment & Plan Note (Signed)
Sliding scale insulin coverage for now. A1c 6.6 (4 months ago)

## 2022-05-22 NOTE — Progress Notes (Signed)
Occupational Therapy Treatment Patient Details Name: [name] MRN: [id number] DOB: 1956/05/31 Today's Date: 05/22/2022   History of present illness Pt is a 66 y.o. male presenting to hospital 11/27 (sent by podiatrist for admission and IV antibiotics) for chronic nonhealing wound to L heel.  Pt admitted with cellulitis of L LE, chronic osteomyelitis with draining sinus (possible acute osteomyelitis), avulsion fx L calcaneus, hypertensive urgency, and atherosclerosis of native arteries of the extremities with ulceration.  S/p recanalization L LE 05/16/22 for limb salvage.  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  PMH includes DM, htn, viral meningitis.   OT comments  [name] was seen for OT treatment on this date. Upon arrival to room pt reclined in chair, agreeable to tx. Pt requires MIN A + RW grooming standing sink side, tolerates ~3 min, including functional reaching outside BOS with single UE on counter. MOD A + RW bed>chair SPT, pt reports anxiousness with RW use, improved with BRW use. Pt reprots plan to use 4WW for transfers at home, lengthy discussion on DME recs and importance of RW use for safety. Educated on desensitization techniques. Pt making good progress toward goals, frequency updated to reflect progress. Discharge recommendation remains appropriate.     Recommendations for follow up therapy are one component of a multi-disciplinary discharge planning process, led by the attending physician.  Recommendations may be updated based on patient status, additional functional criteria and insurance authorization.    Follow Up Recommendations  Acute inpatient rehab (3hours/day)     Assistance Recommended at Discharge Frequent or constant Supervision/Assistance  Patient can return home with the following  A little help with walking and/or transfers;A little help with bathing/dressing/bathroom   Equipment Recommendations  Wheelchair (measurements OT);Wheelchair cushion (measurements OT)     Recommendations for Other Services Rehab consult    Precautions / Restrictions Precautions Precautions: Fall Restrictions Weight Bearing Restrictions: Yes Other Position/Activity Restrictions: Full WB'ing R leg; NWB L LE s/p BKA       Mobility Bed Mobility Overal bed mobility: Needs Assistance Bed Mobility: Sit to Supine       Sit to supine: Min guard        Transfers Overall transfer level: Needs assistance Equipment used: Rolling walker (2 wheels) Transfers: Sit to/from Stand, Bed to chair/wheelchair/BSC Sit to Stand: Min assist     Step pivot transfers: Mod assist     General transfer comment: moderate LOBs trasnfering UE from chair to RW     Balance Overall balance assessment: Needs assistance Sitting-balance support: No upper extremity supported Sitting balance-Leahy Scale: Good     Standing balance support: Single extremity supported, During functional activity Standing balance-Leahy Scale: Fair                             ADL either performed or assessed with clinical judgement   ADL Overall ADL's : Needs assistance/impaired                                       General ADL Comments: MIN A + RW grooming standing sink side, tolerates ~3 min, including functional reaching outside BOS with single UE on counter. MOD A + RW for simulated BSC t/f      Cognition Arousal/Alertness: Awake/alert Behavior During Therapy: WFL for tasks assessed/performed Overall Cognitive Status: Within Functional Limits for tasks assessed  General Comments: decreased safety awareness - prefers 4WW over RW                   Pertinent Vitals/ Pain       Pain Assessment Pain Assessment: No/denies pain   Frequency  Min 4X/week        Progress Toward Goals  OT Goals(current goals can now be found in the care plan section)  Progress towards OT goals: Progressing toward goals  Acute  Rehab OT Goals Patient Stated Goal: to go home w/c level OT Goal Formulation: With patient/family Time For Goal Achievement: 06/03/22 Potential to Achieve Goals: Fair ADL Goals Pt Will Transfer to Toilet: with modified independence Pt Will Perform Toileting - Clothing Manipulation and hygiene: with modified independence;sit to/from stand  Plan Discharge plan remains appropriate;Frequency needs to be updated    Co-evaluation                 AM-PAC OT "6 Clicks" Daily Activity     Outcome Measure   Help from another person eating meals?: None Help from another person taking care of personal grooming?: A Little Help from another person toileting, which includes using toliet, bedpan, or urinal?: A Lot Help from another person bathing (including washing, rinsing, drying)?: A Lot Help from another person to put on and taking off regular upper body clothing?: A Little Help from another person to put on and taking off regular lower body clothing?: A Lot 6 Click Score: 16    End of Session Equipment Utilized During Treatment: Rolling walker (2 wheels);Gait belt  OT Visit Diagnosis: Unsteadiness on feet (R26.81)   Activity Tolerance Patient tolerated treatment well   Patient Left in bed;with call bell/phone within reach   Nurse Communication          Time: 5102-5852 OT Time Calculation (min): 42 min  Charges: OT General Charges $OT Visit: 1 Visit OT Treatments $Self Care/Home Management : 23-37 mins $Therapeutic Activity: 8-22 mins  Kathie Dike, M.S. OTR/L  05/22/22, 3:26 PM  ascom [phone]

## 2022-05-22 NOTE — Progress Notes (Signed)
Inpatient Rehab Admissions Coordinator:    I spoke with pt. Regarding potential CIR admit. He is interested. Wife can provide 24/7 support. I will open a case with his insurance and pursue for admit.   Megan Salon, MS, CCC-SLP Rehab Admissions Coordinator  [phone] (celll) [phone] (office)

## 2022-05-22 NOTE — Assessment & Plan Note (Signed)
Acute on Chronic osteomyelitis with draining sinus of LLE Avulsion fracture of left calcaneus  MRI left foot confirmed osteomyelitis involving the posterior aspect of the calcaneous. S/p left BKA by vascular surgery on 05/19/22, POD 3 Abx changed to PO Cipro + Amoxy on 12/3 Follow wound cultures Vascular surgery following.  Had a dressing change done today by vascular surgery

## 2022-05-22 NOTE — Assessment & Plan Note (Signed)
Was transient and resolved. He is at his baseline MS.  MRI brain - neg on 12/2 

## 2022-05-22 NOTE — Progress Notes (Signed)
[name] and Vascular Surgery  Daily Progress [name]   Subjective  - POD # 3  S/P: Left below the knee amputation.   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    Patient has seen podiatrist on 05/15/22 with acute foot pain and x-rays revealed an avulsion fracture of calcaneum raising the concern for possible acute osteomyelitis.  Patient was started on doxycycline for possible wound infection.  Vascular surgery consulted, Patient underwent successful recanalization left lower extremity for limb salvage with only two-vessel runoff.  Post procedure the patient was told in the future he most likely will need to expect below-knee amputation and transition to a prosthetic which will be the highest chance of success in a reasonable timeframe. Limb salvage likely would take 6 to 12 months and has a low chance of success in the end. After a [place] discussion with his family the patient decided to go forward with the vascular surgery to undergo below-knee amputation on 12/1.  Today he is recovering as except did.  Objective Vitals:   05/21/22 1928 05/22/22 0406 05/22/22 0802 05/22/22 1527  BP: 123/60 (!) 117/57 (!) 116/57 (!) 116/59  Pulse: 95 92 88 85  Resp: 16 16 18 18   Temp: 98.6 F (37 C) 98.9 F (37.2 C) 98.4 F (36.9 C) 98.8 F (37.1 C)  TempSrc: Oral Oral Oral Oral  SpO2: 93% 99% 99% 100%  Weight:      Height:        Intake/Output Summary (Last 24 hours) at 05/22/2022 1544 Last data filed at 05/22/2022 0900 Gross per 24 hour  Intake 240 ml  Output --  Net 240 ml    PULM  CTAB CV  RRR VASC  patient is below the knee amputation is healing well.  No signs or symptoms of  hematoma, seroma, or infection.  Dressing is noted to have some old dried blood.  All the patient's staples are intact.  Laboratory CBC    Component Value Date/Time   WBC 13.0 (H) 05/22/2022 0500   HGB 7.8 (L) 05/22/2022 0500   HCT 23.4 (L) 05/22/2022 0500   PLT 221 05/22/2022 0500    BMET    Component Value Date/Time   NA 136 05/22/2022 0500   K 3.6 05/22/2022 0500   CL 109 05/22/2022 0500   CO2 17 (L) 05/22/2022 0500   GLUCOSE 133 (H) 05/22/2022 0500   BUN 31 (H) 05/22/2022 0500   CREATININE 1.45 (H) 05/22/2022 0500   CREATININE 1.31 (H) 03/15/2020 1700   CALCIUM 7.8 (L) 05/22/2022 0500   GFRNONAA 54 (L) 05/22/2022 0500   GFRAA >60 01/10/2020 1107    Assessment/Planning: POD #3 s/p left below the knee amputation.  Patient is resting on postop day 3 from a left below the knee amputation.  Patient's dressing was taken down and changed today.  Staples all remain intact.  No signs or symptoms of hematoma, seroma, or infection.  Dressing was then reapplied.  Ace bandage was put in place to help control swelling to the lower extremity.  Patient denies any pain.  He does state that he has had intermittent phantom pain which he feels in his left foot.  He states the pain is very tolerable.  Patient  states caseworker has come and spoken to him about going to rehab.  Vascular surgery is in agreement with this plan prior to the patient going home.  Patient and family were made aware.   [name]  05/22/2022, 3:44 PM

## 2022-05-22 NOTE — Assessment & Plan Note (Signed)
Continue levothyroxine 

## 2022-05-22 NOTE — Plan of Care (Signed)
  Problem: Clinical Measurements: Goal: Ability to avoid or minimize complications of infection will improve Outcome: Progressing   Problem: Skin Integrity: Goal: Skin integrity will improve Outcome: Progressing   Problem: Education: Goal: Knowledge of General Education information will improve Description: Including pain rating scale, medication(s)/side effects and non-pharmacologic comfort measures Outcome: Progressing   Problem: Health Behavior/Discharge Planning: Goal: Ability to manage health-related needs will improve Outcome: Progressing   Problem: Clinical Measurements: Goal: Ability to maintain clinical measurements within normal limits will improve Outcome: Progressing Goal: Will remain free from infection Outcome: Progressing Goal: Diagnostic test results will improve Outcome: Progressing Goal: Respiratory complications will improve Outcome: Progressing Goal: Cardiovascular complication will be avoided Outcome: Progressing   Problem: Activity: Goal: Risk for activity intolerance will decrease Outcome: Progressing   Problem: Nutrition: Goal: Adequate nutrition will be maintained Outcome: Progressing   Problem: Coping: Goal: Level of anxiety will decrease Outcome: Progressing   Problem: Elimination: Goal: Will not experience complications related to bowel motility Outcome: Progressing Goal: Will not experience complications related to urinary retention Outcome: Progressing   Problem: Pain Managment: Goal: General experience of comfort will improve Outcome: Progressing   Problem: Safety: Goal: Ability to remain free from injury will improve Outcome: Progressing   Problem: Skin Integrity: Goal: Risk for impaired skin integrity will decrease Outcome: Progressing   Problem: Education: Goal: Understanding of CV disease, CV risk reduction, and recovery process will improve Outcome: Progressing Goal: Individualized Educational Video(s) Outcome:  Progressing   Problem: Activity: Goal: Ability to return to baseline activity level will improve Outcome: Progressing   Problem: Cardiovascular: Goal: Ability to achieve and maintain adequate cardiovascular perfusion will improve Outcome: Progressing Goal: Vascular access site(s) Level 0-1 will be maintained Outcome: Progressing   Problem: Health Behavior/Discharge Planning: Goal: Ability to safely manage health-related needs after discharge will improve Outcome: Progressing

## 2022-05-22 NOTE — Assessment & Plan Note (Signed)
BP 230/83 on arrival - now resolved. Continue home metoprolol, Amlodipine & Hydralazine IV as needed 

## 2022-05-22 NOTE — Progress Notes (Signed)
  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  Patient has seen podiatrist on 05/15/22 with acute foot pain and x-rays revealed an avulsion fracture of calcaneum raising the concern for possible acute osteomyelitis.  Patient was started on doxycycline for possible wound infection. Patient has also seen vascular surgery on 11/16 with recommendation for left leg angiography.  Patient is admitted for further evaluation.  Vascular surgery consulted , Patient underwent successful recanalization left lower extremity for limb salvage with two-vessel runoff.  Patient tolerated procedure well.  Podiatry and vascular surgery has scheduled for below-knee amputation on 12/1.  12/1: lt BKA amputation 12/2: PT, OT - CIR, MRI brain - neg 12/3: Abx changed to PO Cipro + Amoxy 12/4: Dressing change by vascular surgery today.  CIR evaluation in progress  Assessment and Plan: * Gangrene of left foot (HCC) Acute on Chronic osteomyelitis with draining sinus of LLE Avulsion fracture of left calcaneus  MRI left foot confirmed osteomyelitis involving the posterior aspect of the calcaneous. S/p left BKA by vascular surgery on 05/19/22, POD 3 Abx changed to PO Cipro + Amoxy on 12/3 Follow wound cultures Vascular surgery following.  Had a dressing change done today by vascular surgery    Avulsion fracture of left calcaneus-resolved as of 05/21/2022 Patient was placed in a boot by podiatry on 11/27 Continue Pain mgmt  AMS (altered mental status) Was  transient and resolved. He is at his baseline MS.  MRI brain - neg on 12/2  Hypertensive urgency BP 230/83 on arrival - now resolved. Continue home metoprolol, Amlodipine & Hydralazine IV as needed  Hypothyroidism Continue levothyroxine  Type 2 diabetes mellitus with diabetic neuropathy, unspecified (HCC) Sliding scale insulin coverage for now. A1c 6.6 (4 months ago)        Subjective: Laying in the bed comfortably.  No new issues.  Dressing change planned for his amputation site today by vascular surgery  Physical Exam: Vitals:   05/21/22 1928 05/22/22 0406 05/22/22 0802 05/22/22 1527  BP: 123/60 (!) 117/57 (!) 116/57 (!) 116/59  Pulse: 95 92 88 85  Resp: 16 16 18 18   Temp: 98.6 F (37 C) 98.9 F (37.2 C) 98.4 F (36.9 C) 98.8 F (37.1 C)  TempSrc: Oral Oral Oral Oral  SpO2: 93% 99% 99% 100%  Weight:      Height:       Constitutional: NAD, AAOx3 HEENT: conjunctivae and lids normal, EOMI CV: No cyanosis.   RESP: normal respiratory effort, on RA Extremities: left BKA wrapped in Ace bandage SKIN: warm, dry Neuro: II - XII grossly intact.   Psych: Normal mood and affect.  Appropriate judgement and reason Data Reviewed:  Hemoglobin 7.8  Family Communication: None  Disposition: Status is: Inpatient Remains inpatient appropriate because: CIR evaluation in progress.  Medically stable  Planned Discharge Destination: Rehab   DVT prophylaxis-Lovenox Time spent: 35 minutes  Author: , MD 05/22/2022 4:24 PM  For on call review [email or web address].

## 2022-05-22 NOTE — Progress Notes (Signed)
Physical Therapy Treatment Patient Details Name: [name] MRN: [id number] DOB: 1955-11-21 Today's Date: 05/22/2022   History of Present Illness Pt is a 66 y.o. male presenting to hospital 11/27 (sent by podiatrist for admission and IV antibiotics) for chronic nonhealing wound to L heel.  Pt admitted with cellulitis of L LE, chronic osteomyelitis with draining sinus (possible acute osteomyelitis), avulsion fx L calcaneus, hypertensive urgency, and atherosclerosis of native arteries of the extremities with ulceration.  S/p recanalization L LE 05/16/22 for limb salvage.  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  PMH includes DM, htn, viral meningitis.    PT Comments    Pt eager and motivated to participate. Pt agreeable to attempt ambulation in room. Pt reports phantom sensation. Education on touching, tapping, and mobility of L LE. At this time, pt with full L knee extension in bed. Does well with several hops in forward direction, however fatigues quickly and unable to perform retro ambulation or turns without significant assist. +2 for assist and safe decent back to recliner. Pt is making great progress and would be excellent candidate for CIR admission.   Recommendations for follow up therapy are one component of a multi-disciplinary discharge planning process, led by the attending physician.  Recommendations may be updated based on patient status, additional functional criteria and insurance authorization.  Follow Up Recommendations  Acute inpatient rehab (3hours/day)     Assistance Recommended at Discharge Frequent or constant Supervision/Assistance  Patient can return home with the following Two people to help with walking and/or transfers;A lot of help with bathing/dressing/bathroom;Help with stairs or ramp for entrance   Equipment Recommendations  Rolling walker (2 wheels);BSC/3in1;Wheelchair (measurements PT);Wheelchair cushion (measurements PT)    Recommendations for Other Services       Precautions  / Restrictions Precautions Precautions: Fall Restrictions Weight Bearing Restrictions: Yes Other Position/Activity Restrictions: Full WB'ing R leg; NWB L LE s/p BKA     Mobility  Bed Mobility Overal bed mobility: Needs Assistance Bed Mobility: Supine to Sit     Supine to sit: Min guard     General bed mobility comments: safe technique and follows commands well    Transfers Overall transfer level: Needs assistance Equipment used: Rolling walker (2 wheels) Transfers: Sit to/from Stand Sit to Stand: Mod assist           General transfer comment: all transfers performed from low bed with assist required for lift off. Slow transfer. Performed x 3 reps and cues for sequencing.    Ambulation/Gait Ambulation/Gait assistance: Mod assist, Max assist Gait Distance (Feet): 5 Feet Assistive device: Rolling walker (2 wheels) Gait Pattern/deviations: Step-to pattern       General Gait Details: hop to pattern with improved technique in forward direction and max assist for retro ambulation wtih buckling noted on R LE. +2 for safety and assist for transfer to safe seated surface. Would recommend close chair follow for increased distance   Stairs             Wheelchair Mobility    Modified Rankin (Stroke Patients Only)       Balance Overall balance assessment: Needs assistance Sitting-balance support: No upper extremity supported Sitting balance-Leahy Scale: Good     Standing balance support: Bilateral upper extremity supported, During functional activity Standing balance-Leahy Scale: Fair                              Cognition Arousal/Alertness: Awake/alert Behavior During Therapy: WFL for  tasks assessed/performed Overall Cognitive Status: Within Functional Limits for tasks assessed                                 General Comments: pleasant and agreeable to session        Exercises Other Exercises Other Exercises: supine ther-ex  peformed including QS, SLRs, hip abd/add, on B LEs. L LE knee ROM = 0 degrees extension. Education given for contracture prevention.    General Comments        Pertinent Vitals/Pain Pain Assessment Pain Assessment: No/denies pain    Home Living                          Prior Function            PT Goals (current goals can now be found in the care plan section) Acute Rehab PT Goals Patient Stated Goal: to improve mobility PT Goal Formulation: With patient Time For Goal Achievement: 06/03/22 Potential to Achieve Goals: Good Progress towards PT goals: Progressing toward goals    Frequency    Min 4X/week      PT Plan Frequency needs to be updated    Co-evaluation              AM-PAC PT "6 Clicks" Mobility   Outcome Measure  Help needed turning from your back to your side while in a flat bed without using bedrails?: None Help needed moving from lying on your back to sitting on the side of a flat bed without using bedrails?: A Little Help needed moving to and from a bed to a chair (including a wheelchair)?: A Lot Help needed standing up from a chair using your arms (e.g., wheelchair or bedside chair)?: A Lot Help needed to walk in hospital room?: Total Help needed climbing 3-5 steps with a railing? : Total 6 Click Score: 13    End of Session Equipment Utilized During Treatment: Gait belt Activity Tolerance: Patient tolerated treatment well Patient left: in chair Nurse Communication: Mobility status PT Visit Diagnosis: Unsteadiness on feet (R26.81);Other abnormalities of gait and mobility (R26.89);Muscle weakness (generalized) (M62.81);Pain Pain - Right/Left: Left Pain - part of body: Leg     Time: 1040-1105 PT Time Calculation (min) (ACUTE ONLY): 25 min  Charges:  $Therapeutic Exercise: 8-22 mins $Therapeutic Activity: 8-22 mins                     [name], PT, [name], [name] [phone]    [name] 05/22/2022, 1:58 PM

## 2022-05-23 ENCOUNTER — Encounter: Admission: RE | Payer: Self-pay | Source: Home / Self Care

## 2022-05-23 ENCOUNTER — Ambulatory Visit
Admission: RE | Admit: 2022-05-23 | Payer: No Typology Code available for payment source | Source: Home / Self Care | Admitting: Vascular Surgery

## 2022-05-23 DIAGNOSIS — I96 Gangrene, not elsewhere classified: Secondary | ICD-10-CM | POA: Diagnosis not present

## 2022-05-23 DIAGNOSIS — E08621 Diabetes mellitus due to underlying condition with foot ulcer: Secondary | ICD-10-CM | POA: Diagnosis not present

## 2022-05-23 DIAGNOSIS — I160 Hypertensive urgency: Secondary | ICD-10-CM | POA: Diagnosis not present

## 2022-05-23 DIAGNOSIS — S92032B Displaced avulsion fracture of tuberosity of left calcaneus, initial encounter for open fracture: Secondary | ICD-10-CM

## 2022-05-23 DIAGNOSIS — I70299 Other atherosclerosis of native arteries of extremities, unspecified extremity: Secondary | ICD-10-CM

## 2022-05-23 LAB — GLUCOSE, CAPILLARY
Glucose-Capillary: 107 mg/dL — ABNORMAL HIGH (ref 70–99)
Glucose-Capillary: 127 mg/dL — ABNORMAL HIGH (ref 70–99)
Glucose-Capillary: 177 mg/dL — ABNORMAL HIGH (ref 70–99)

## 2022-05-23 LAB — SURGICAL PATHOLOGY

## 2022-05-23 SURGERY — LOWER EXTREMITY ANGIOGRAPHY
Anesthesia: Moderate Sedation | Site: Leg Lower | Laterality: Left

## 2022-05-23 MED ORDER — CLOPIDOGREL BISULFATE 75 MG PO TABS: 75.0000 mg | ORAL_TABLET | Freq: Every day | ORAL | 3 refills | Status: DC

## 2022-05-23 MED ORDER — AMLODIPINE BESYLATE 10 MG PO TABS: 10.0000 mg | ORAL_TABLET | Freq: Every day | ORAL | 10 refills | Status: DC

## 2022-05-23 MED ORDER — DOXYCYCLINE HYCLATE 100 MG PO TABS: 100.0000 mg | ORAL_TABLET | Freq: Two times a day (BID) | ORAL | 0 refills | Status: DC

## 2022-05-23 MED ORDER — ASPIRIN 81 MG PO TBEC: 81.0000 mg | DELAYED_RELEASE_TABLET | Freq: Every day | ORAL | 12 refills | Status: DC

## 2022-05-23 NOTE — Progress Notes (Signed)
Physical Therapy Treatment Patient Details Name: [name] MRN: [id number] DOB: [birth year] Today's Date: 05/23/2022   History of Present Illness Pt is a 66 y.o. male presenting to hospital 11/27 (sent by podiatrist for admission and IV antibiotics) for chronic nonhealing wound to L heel.  Pt admitted with cellulitis of L LE, chronic osteomyelitis with draining sinus (possible acute osteomyelitis), avulsion fx L calcaneus, hypertensive urgency, and atherosclerosis of native arteries of the extremities with ulceration.  S/p recanalization L LE 05/16/22 for limb salvage.  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  PMH includes DM, htn, viral meningitis.   PT Comments    Patient agreeable to PT session. He is tearful at times and is requesting to return home. Explained the reasoning with the care team's recommendation for rehab first, then returning home. Patient verbalized understanding but is still adamant to return home.  This session was focused on simulation of home environment with transfers performed requiring the least amount of physical assistance. Education provided on setting up the wheelchair and other DME to decrease fall risk at home. Multiple transfers performed including lateral scoot transfer and sit to stand transfers. The patient needs minimal assistance with all transfers at this time and he still required assistance for lower body dressing. Ambulation not attempted and encourage patient not to attempt at home initially without assistance from his therapist due to high risk for falls.  Rehab placement is still recommended at this time, however patient wants to return home. Recommend wheelchair and rolling walker for home use with assistance from family with all functional mobility.    Recommendations for follow up therapy are one component of a multi-disciplinary discharge planning process, led by the attending physician.  Recommendations may be updated based on patient status, additional functional criteria  and insurance authorization.  Follow Up Recommendations  Acute inpatient rehab (3hours/day)     Assistance Recommended at Discharge Frequent or constant Supervision/Assistance  Patient can return home with the following Two people to help with walking and/or transfers;A lot of help with bathing/dressing/bathroom;Help with stairs or ramp for entrance   Equipment Recommendations  Rolling walker (2 wheels);BSC/3in1;Wheelchair (measurements PT);Wheelchair cushion (measurements PT)    Recommendations for Other Services       Precautions / Restrictions Precautions Precautions: Fall Restrictions Weight Bearing Restrictions: Yes     Mobility  Bed Mobility               General bed mobility comments: not assessed as patient sitting up on arrival and post session    Transfers Overall transfer level: Needs assistance Equipment used:  (occasional use of the railling in bathroom) Transfers: Sit to/from Stand, Bed to chair/wheelchair/BSC Sit to Stand: Min assist (using railing in the bathroom)          Lateral/Scoot Transfers: Min assist General transfer comment: patient performed lateral scooting transfer from bed to recliner with the drop arm down with minimal assistance and verbal cues for safety. from the recliner chair, patient performed sit to stand transfer using the wall railing with minimal assistance for lifting and then performed a lateral scoot transfer with Min A from recliner to toiliet. attempted so simulate home environment set-up as patient is now requesting to return home instead of going to rehab    Ambulation/Gait               General Gait Details: Not attempted today as the focus was to prepare patient for home discharge per his request. I recommend that the patient does not  try ambulating without a therapist with him at home due to unsteadiness, limited standing tolerance, physical asisstance and cues required.   Stairs             Wheelchair  Mobility    Modified Rankin (Stroke Patients Only)       Balance Overall balance assessment: Needs assistance Sitting-balance support: Feet supported Sitting balance-Leahy Scale: Good Sitting balance - Comments: patient able to complete peri-hygiene after bowel movement with set-up of the wipes with no loss of balance                                    Cognition Arousal/Alertness: Awake/alert Behavior During Therapy: WFL for tasks assessed/performed Overall Cognitive Status: Within Functional Limits for tasks assessed                                 General Comments: decreased awareness of physical limitations and decreased insight into deficits. keeps stating he wants to just return home no matter what despite CIR being recommended        Exercises      General Comments General comments (skin integrity, edema, etc.): patient continues to require physical assistance with lateral scoot transfers, sit to stand transfers, and lower body dressing. patient is insistant that his family/friends will provide support at discharge and he is still requesting to return home.      Pertinent Vitals/Pain Pain Assessment Pain Assessment: No/denies pain    Home Living                          Prior Function            PT Goals (current goals can now be found in the care plan section) Acute Rehab PT Goals Patient Stated Goal: to go home today PT Goal Formulation: With patient Time For Goal Achievement: 06/03/22 Potential to Achieve Goals: Good Progress towards PT goals: Progressing toward goals    Frequency    Min 4X/week      PT Plan Current plan remains appropriate    Co-evaluation              AM-PAC PT "6 Clicks" Mobility   Outcome Measure  Help needed turning from your back to your side while in a flat bed without using bedrails?: None Help needed moving from lying on your back to sitting on the side of a flat bed  without using bedrails?: A Little Help needed moving to and from a bed to a chair (including a wheelchair)?: A Little Help needed standing up from a chair using your arms (e.g., wheelchair or bedside chair)?: A Little Help needed to walk in hospital room?: Total Help needed climbing 3-5 steps with a railing? : Total 6 Click Score: 15    End of Session Equipment Utilized During Treatment: Gait belt Activity Tolerance: Patient tolerated treatment well Patient left: in chair;with call bell/phone within reach   PT Visit Diagnosis: Unsteadiness on feet (R26.81);Other abnormalities of gait and mobility (R26.89);Muscle weakness (generalized) (M62.81);Pain     Time: 4967-5916 PT Time Calculation (min) (ACUTE ONLY): 44 min  Charges:  $Therapeutic Activity: 38-52 mins                     Donna Bernard, PT, MPT    Ina Homes 05/23/2022,  11:40 AM

## 2022-05-23 NOTE — Progress Notes (Signed)
Pt was very sad and tearful tonight and he verbalized his strong desire to be discharged to home today. He reports that he feels great and he has the 24/7 support and whatever he may need at home. He is refusing to go to rehab.

## 2022-05-23 NOTE — Progress Notes (Signed)
Pt discharged per MD order. IV removed. Discharge instructions reviewed with pt. Pt verbalized understanding. All questions answered to pt satisfaction. Pt wheeled out in personal wheelchair by his brother.

## 2022-05-23 NOTE — Plan of Care (Signed)
  Problem: Clinical Measurements: Goal: Ability to avoid or minimize complications of infection will improve Outcome: Progressing   Problem: Skin Integrity: Goal: Skin integrity will improve Outcome: Progressing   Problem: Education: Goal: Knowledge of General Education information will improve Description: Including pain rating scale, medication(s)/side effects and non-pharmacologic comfort measures Outcome: Progressing   Problem: Health Behavior/Discharge Planning: Goal: Ability to manage health-related needs will improve Outcome: Progressing   Problem: Clinical Measurements: Goal: Ability to maintain clinical measurements within normal limits will improve Outcome: Progressing Goal: Will remain free from infection Outcome: Progressing Goal: Diagnostic test results will improve Outcome: Progressing Goal: Respiratory complications will improve Outcome: Progressing Goal: Cardiovascular complication will be avoided Outcome: Progressing   Problem: Activity: Goal: Risk for activity intolerance will decrease Outcome: Progressing   Problem: Nutrition: Goal: Adequate nutrition will be maintained Outcome: Progressing   Problem: Coping: Goal: Level of anxiety will decrease Outcome: Progressing   Problem: Elimination: Goal: Will not experience complications related to bowel motility Outcome: Progressing Goal: Will not experience complications related to urinary retention Outcome: Progressing   Problem: Pain Managment: Goal: General experience of comfort will improve Outcome: Progressing   Problem: Education: Goal: Understanding of CV disease, CV risk reduction, and recovery process will improve Outcome: Progressing Goal: Individualized Educational Video(s) Outcome: Progressing   Problem: Activity: Goal: Ability to return to baseline activity level will improve Outcome: Progressing   Problem: Cardiovascular: Goal: Ability to achieve and maintain adequate  cardiovascular perfusion will improve Outcome: Progressing Goal: Vascular access site(s) Level 0-1 will be maintained Outcome: Progressing   Problem: Health Behavior/Discharge Planning: Goal: Ability to safely manage health-related needs after discharge will improve Outcome: Progressing

## 2022-05-23 NOTE — PMR Pre-admission (Shared)
PMR Admission Coordinator Pre-Admission Assessment  Patient: [name] is an 66 y.o., male MRN: [id number] DOB: 1956-05-21 Height: _0  (172.7 cm) Weight: 100.9 kg  Insurance Information HMO: yes    PPO:      PCP:      IPA:      80/20:      OTHER:  PRIMARY: [place] Preferred      Policy#: [id number]      Subscriber: pt CM Name: ***      Phone#: [phone]     Fax#: [phone] Pre-Cert#: [id number]      Employer: *** Benefits:  Phone #: Albesa Seen     Name:  Irene Shipper Date: 06/20/2019 - still active  Deductible: $800 ($800 met)  OOP Max: $5,000 ($5,000 met)  CIR: 80% coverage, 20% co-insurance SNF: 80% coverage, 20% co-insurance Outpatient:  80% coverage, 20% co-insurance Home Health:  80% coverage, 20% co-insurance DME: 80% coverage, 20% co-insurance Providers: in network   SECONDARY:       Policy#:      Phone#:    Development worker, community:       Phone#:   The Engineer, petroleum" for patients in Inpatient Rehabilitation Facilities with attached "Privacy Act [place] Records" was provided and verbally reviewed with: Patient  Emergency Contact Information Contact Information     Name Relation Home Work Mobile   [place] Spouse   [phone]       Current Medical History  Patient Admitting Diagnosis: L BKA History of Present Illness: *** Complete NIHSS TOTAL: 0  Patient's medical record from [facility] has been reviewed by the rehabilitation admission coordinator and physician.  Past Medical History  Past Medical History:  Diagnosis Date   Diabetes mellitus    Hematospermia    Hypertension    Nephrolithiasis 02/07/07   Viral meningitis     Has the patient had major surgery during 100 days prior to admission? Yes  Family History   family history includes Arthritis in an other family member; Cholecystitis in his brother, sister, and sister; Coronary artery disease in an other family member; Diabetes in his  brother, brother, father, sister, and sister; Heart disease in his father and sister; Hypertension in an other family member; Parkinson's disease in his brother; Rheum arthritis in his mother; Stroke in an other family member.  Current Medications  Current Facility-Administered Medications:    0.9 %  sodium chloride infusion, 250 mL, Intravenous, PRN, Schnier, Dolores Lory, MD   acetaminophen (TYLENOL) tablet 650 mg, 650 mg, Oral, Q6H PRN, 650 mg at 05/21/22 2013 **OR** acetaminophen (TYLENOL) suppository 650 mg, 650 mg, Rectal, Q6H PRN, Schnier, Dolores Lory, MD   acetaminophen (TYLENOL) tablet 650 mg, 650 mg, Oral, Q4H PRN, Schnier, Dolores Lory, MD   ALPRAZolam Duanne Moron) tablet 0.5 mg, 0.5 mg, Oral, TID PRN, Delana Meyer, Dolores Lory, MD, 0.5 mg at 05/22/22 2129   alum & mag hydroxide-simeth (MAALOX/MYLANTA) 200-200-20 MG/5ML suspension 15 mL, 15 mL, Oral, Q6H PRN, Max Sane, MD   amLODipine (NORVASC) tablet 10 mg, 10 mg, Oral, Daily, Schnier, Dolores Lory, MD, 10 mg at 05/23/22 0824   amoxicillin (AMOXIL) capsule 500 mg, 500 mg, Oral, Q8H, Nazari, Walid A, RPH, 500 mg at 05/23/22 1696   aspirin EC tablet 81 mg, 81 mg, Oral, Daily, Schnier, Dolores Lory, MD, 81 mg at 05/23/22 0825   ciprofloxacin (CIPRO) tablet 500 mg, 500 mg, Oral, BID, Rito Ehrlich A, RPH, 500 mg at 05/23/22 0825   clopidogrel (PLAVIX) tablet 75  mg, 75 mg, Oral, Daily, Schnier, Dolores Lory, MD, 75 mg at 05/23/22 1027   cyclobenzaprine (FLEXERIL) tablet 10 mg, 10 mg, Oral, TID PRN, Enzo Bi, MD, 10 mg at 05/21/22 2013   enoxaparin (LOVENOX) injection 50 mg, 0.5 mg/kg, Subcutaneous, Q24H, Enzo Bi, MD, 50 mg at 05/22/22 2130   hydrALAZINE (APRESOLINE) injection 10 mg, 10 mg, Intravenous, Q6H PRN, Schnier, Dolores Lory, MD   insulin aspart (novoLOG) injection 0-15 Units, 0-15 Units, Subcutaneous, Q4H, Schnier, Dolores Lory, MD, 2 Units at 05/23/22 0824   lactulose ([place]) 10 GM/15ML solution 20 g, 20 g, Oral, Once, Schnier, Dolores Lory, MD    levothyroxine (SYNTHROID) tablet 50 mcg, 50 mcg, Oral, Q0600, Schnier, Dolores Lory, MD, 50 mcg at 05/23/22 0513   metoprolol succinate (TOPROL-XL) 24 hr tablet 25 mg, 25 mg, Oral, Daily, Schnier, Dolores Lory, MD, 25 mg at 05/23/22 2536   morphine (PF) 2 MG/ML injection 2 mg, 2 mg, Intravenous, Q2H PRN, Schnier, Dolores Lory, MD   ondansetron (ZOFRAN) tablet 4 mg, 4 mg, Oral, Q6H PRN **OR** ondansetron (ZOFRAN) injection 4 mg, 4 mg, Intravenous, Q6H PRN, Schnier, Dolores Lory, MD   ondansetron [facility]) injection 4 mg, 4 mg, Intravenous, Q6H PRN, Kris Hartmann, NP   oxyCODONE-acetaminophen (PERCOCET/ROXICET) 5-325 MG per tablet 1-2 tablet, 1-2 tablet, Oral, Q4H PRN, Schnier, Dolores Lory, MD   polyethylene glycol (MIRALAX / GLYCOLAX) packet 17 g, 17 g, Oral, BID PRN, Enzo Bi, MD, 17 g at 05/21/22 1206  Patients Current Diet:  Diet Order             Diet Carb Modified Fluid consistency: Thin; Room service appropriate? Yes  Diet effective now                   Precautions / Restrictions Precautions Precautions: Fall Restrictions Weight Bearing Restrictions: Yes LLE Weight Bearing: Weight bearing as tolerated Other Position/Activity Restrictions: Full WB'ing R leg; NWB L LE s/p BKA   Has the patient had 2 or more falls or a fall with injury in the past year? No  Prior Activity Level    Prior Functional Level Self Care: Did the patient need help bathing, dressing, using the toilet or eating? Independent  Indoor Mobility: Did the patient need assistance with walking from room to room (with or without device)? Independent  Stairs: Did the patient need assistance with internal or external stairs (with or without device)? Independent  Functional Cognition: Did the patient need help planning regular tasks such as shopping or remembering to take medications? Independent  Patient Information Are you of Hispanic, Latino/a,or Spanish origin?: A. No, not of Hispanic, Latino/a, or Spanish  origin What is your race?: A. White Do you need or want an interpreter to communicate with a doctor or health care staff?: 0. No  Patient's Response To:  Health Literacy and Transportation Is the patient able to respond to health literacy and transportation needs?: No Health Literacy - How often do you need to have someone help you when you read instructions, pamphlets, or other written material from your doctor or pharmacy?: Never In the past 12 months, has lack of transportation kept you from medical appointments or from getting medications?: No In the past 12 months, has lack of transportation kept you from meetings, work, or from getting things needed for daily living?: No  Development worker, international aid / [place] Devices/Equipment: None Home Equipment: Rollator (4 wheels), BSC/3in1, Shower seat - built in, [employer] - tub/shower  Prior Device  Use: Indicate devices/aids used by the patient prior to current illness, exacerbation or injury? Walker  Current Functional Level Cognition  Overall Cognitive Status: Within Functional Limits for tasks assessed Orientation Level: Oriented X4 General Comments: decreased safety awareness - prefers 4WW over RW    Extremity Assessment (includes Sensation/Coordination)  Upper Extremity Assessment: Overall WFL for tasks assessed  Lower Extremity Assessment: LLE deficits/detail, Defer to PT evaluation LLE Deficits / Details: s/p L BKA LLE: Unable to fully assess due to pain    ADLs  Overall ADL's : Needs assistance/impaired Eating/Feeding: Set up, Sitting Grooming: Set up Upper Body Dressing : Minimal assistance Upper Body Dressing Details (indicate cue type and reason): anticipated Lower Body Dressing: Moderate assistance, Sit to/from stand Lower Body Dressing Details (indicate cue type and reason): underwear Toilet Transfer: Min guard, Rolling walker (2 wheels) Toilet Transfer Details (indicate cue type and reason): simulated,  intermittent vcs for technique Toileting- Clothing Manipulation and Hygiene: Moderate assistance, Sit to/from stand Functional mobility during ADLs: Min guard, Rolling walker (2 wheels), Minimal assistance (approx 6' in room with RW, close chair follow) General ADL Comments: MIN A + RW grooming standing sink side, tolerates ~3 min, including functional reaching outside BOS with single UE on counter. MOD A + RW for simulated BSC t/f    Mobility  Overal bed mobility: Needs Assistance Bed Mobility: Sit to Supine Supine to sit: Min guard Sit to supine: Min guard General bed mobility comments: safe technique and follows commands well    Transfers  Overall transfer level: Needs assistance Equipment used: Rolling walker (2 wheels) Transfers: Sit to/from Stand, Bed to chair/wheelchair/BSC Sit to Stand: Min assist Bed to/from chair/wheelchair/BSC transfer type:: Step pivot Step pivot transfers: Mod assist General transfer comment: moderate LOBs trasnfering UE from chair to RW    Ambulation / Gait / Stairs / Wheelchair Mobility  Ambulation/Gait Ambulation/Gait assistance: Mod assist, Max assist Gait Distance (Feet): 5 Feet Assistive device: Rolling walker (2 wheels) Gait Pattern/deviations: Step-to pattern General Gait Details: hop to pattern with improved technique in forward direction and max assist for retro ambulation wtih buckling noted on R LE. +2 for safety and assist for transfer to safe seated surface. Would recommend close chair follow for increased distance    Posture / Balance Dynamic Sitting Balance Sitting balance - Comments: steady sitting reaching within BOS Balance Overall balance assessment: Needs assistance Sitting-balance support: No upper extremity supported Sitting balance-Leahy Scale: Good Sitting balance - Comments: steady sitting reaching within BOS Standing balance support: Single extremity supported, During functional activity Standing balance-Leahy Scale:  Fair Standing balance comment: assist to steady with standing activities utilizing RW    Special needs/care consideration Skin surgical incision    Previous Home Environment (from acute therapy documentation) Living Arrangements: Spouse/significant other Available Help at Discharge: Family, Available PRN/intermittently Type of Home: House Home Layout: One level Home Access: Stairs to enter Entrance Stairs-Rails: Right, Left Entrance Stairs-Number of Steps: 7 Bathroom Shower/Tub: Multimedia programmer: [place]: No Additional Comments: Has BSC over current toilet.  Pt's brother is bringing (on Monday) another [facility] and manual w/c for pt to use.  Discharge Living Setting Plans for Discharge Living Setting: Patient's home Type of Home at Discharge: House Discharge Home Layout: One level Discharge Home Access: Stairs to enter Entrance Stairs-Rails: Right Entrance Stairs-Number of Steps: 7 Discharge Bathroom Shower/Tub: Walk-in shower Discharge Bathroom Toilet: Handicapped height Discharge Bathroom Accessibility: No Does the patient have any problems obtaining your medications?: No  Social/Family/Support  Systems Patient Roles: Spouse Contact Information: [phone] Anticipated Caregiver: Danell Verno Anticipated Caregiver's Contact Information: [phone] Ability/Limitations of Caregiver: Mod A Caregiver Availability: 24/7 Discharge Plan Discussed with Primary Caregiver: Yes Is Caregiver In Agreement with Plan?: Yes Does Caregiver/Family have Issues with Lodging/Transportation while Pt is in Rehab?: No  Goals Patient/Family Goal for Rehab: PT/OT Min A Expected length of stay: 7-10 days Pt/Family Agrees to Admission and willing to participate: Yes Program Orientation Provided & Reviewed with Pt/Caregiver Including Roles  & Responsibilities: Yes  Decrease burden of Care through IP rehab admission: not anticipated   Possible need for SNF placement  upon discharge: none   Patient Condition: I have reviewed medical records from [facility], spoken with {CHL IP CSW YX:[id number]}, and {CHL IP Patient Spouse Son Daughter Family Member:304550004}. I {CHL IP at bedside or phone:304550005} for inpatient rehabilitation assessment.  Patient will benefit from ongoing {CHL IP PT OT OMQ:[id number]}, can actively participate in 3 hours of therapy a day 5 days of the week, and can make measurable gains during the admission.  Patient will also benefit from the coordinated team approach during an Inpatient Acute Rehabilitation admission.  The patient will receive intensive therapy as well as Rehabilitation physician, nursing, social worker, and care management interventions.  Due to {due QW:0379444} the patient requires 24 hour a day rehabilitation nursing.  The patient is currently *** with mobility and basic ADLs.  Discharge setting and therapy post discharge at [facility] IP discharge location:304550006} is anticipated.  Patient has agreed to participate in the Acute Inpatient Rehabilitation Program and will admit {Time; today/tomorrow:10263}.  Preadmission Screen Completed By:  Genella Mech, 05/23/2022 10:12 AM ______________________________________________________________________   Discussed status with Dr. Marland Kitchen on *** at *** and received approval for admission today.  Admission Coordinator:  Genella Mech, CCC-SLP, time Marland KitchenSudie Grumbling ***   Assessment/Plan: Diagnosis: Does the need for close, 24 hr/day Medical supervision in concert with the patient's rehab needs make it unreasonable for this patient to be served in a less intensive setting? {yes_no_potentially:3041433} Co-Morbidities requiring supervision/potential complications: *** Due to {due QF:9012224}, does the patient require 24 hr/day rehab nursing? {yes_no_potentially:3041433} Does the patient require coordinated care of a physician, rehab nurse, PT, OT, and SLP to address physical and functional deficits  in the context of the above medical diagnosis(es)? {yes_no_potentially:3041433} Addressing deficits in the following areas: {deficits:3041436} Can the patient actively participate in an intensive therapy program of at least 3 hrs of therapy 5 days a week? {yes_no_potentially:3041433} The potential for patient to make measurable gains while on inpatient rehab is {potential:3041437} Anticipated functional outcomes upon discharge from inpatient rehab: {functional outcomes:304600100} PT, {functional outcomes:304600100} OT, {functional outcomes:304600100} SLP Estimated rehab length of stay to reach the above functional goals is: *** Anticipated discharge destination: {anticipated dc setting:21604} 10. Overall Rehab/Functional Prognosis: {potential:3041437}   MD Signature: ***

## 2022-05-23 NOTE — Plan of Care (Signed)
  Problem: Pain Managment: Goal: General experience of comfort will improve Outcome: Progressing   Problem: Safety: Goal: Ability to remain free from injury will improve Outcome: Progressing   Problem: Skin Integrity: Goal: Risk for impaired skin integrity will decrease Outcome: Progressing   

## 2022-05-23 NOTE — Progress Notes (Signed)
Inpatient Rehab Admissions Coordinator:    Pt. States that he wants to go home and no longer wants CIR. Wife is agreeable to take him home.  Megan Salon, MS, CCC-SLP Rehab Admissions Coordinator  [phone] (celll) [phone] (office)

## 2022-05-23 NOTE — TOC Transition Note (Signed)
Transition of Care [facility]) - CM/SW Discharge Note   Patient Details  Name: [name] MRN: [id number] Date of Birth: 1955-11-15  Transition of Care [facility]) CM/SW Contact:  Margarito Liner, LCSW Phone Number: 05/23/2022, 12:57 PM   Clinical Narrative:   Patient has orders to discharge home today. Set up with [facility] for PT, OT, RN. Start of care 24-48 hours. Ordered wheelchair through [employer] to be delivered to the room before he leaves. Patient prefers his rollator to getting a RW but will order through [employer], etc if needed later. No further concerns. CSW signing off.  Final next level of care: Home w Home Health Services Barriers to Discharge: Barriers Resolved   Patient Goals and CMS Choice   CMS Medicare.gov Compare Post Acute Care list provided to:: Patient Choice offered to / list presented to : Patient  Discharge Placement                Patient to be transferred to facility by: Friend   Patient and family notified of of transfer: 05/23/22  Discharge Plan and Services     Post Acute Care Choice: Durable Medical Equipment, Home Health          DME Arranged: Lightweight manual wheelchair with seat cushion DME Agency: [employer] Date DME Agency Contacted: 05/23/22   Representative spoke with at DME Agency: Demetra Shiner HH Arranged: RN, PT, OT [facility] Agency: [facility] Date [facility] Agency Contacted: 05/23/22   Representative spoke with at [facility] Agency: [employer] [facility]  Social Determinants of Health (SDOH) Interventions Food Insecurity Interventions: Intervention Not Indicated   Readmission Risk Interventions     No data to display

## 2022-05-23 NOTE — TOC CM/SW Note (Signed)
    Durable Medical Equipment  (From admission, onward)           Start     Ordered   05/23/22 1206  For home use only DME lightweight manual wheelchair with seat cushion  Once       Comments: Patient suffers from amputation which impairs their ability to perform daily activities like bathing, dressing, grooming, and toileting in the home.  A cane, crutch, or walker will not resolve  issue with performing activities of daily living. A wheelchair will allow patient to safely perform daily activities. Patient is not able to propel themselves in the home using a standard weight wheelchair due to endurance and general weakness. Patient can self propel in the lightweight wheelchair. Length of need 6 months . Accessories: elevating leg rests (ELRs), wheel locks, extensions and anti-tippers.   05/23/22 1206

## 2022-05-23 NOTE — TOC Initial Note (Signed)
Transition of Care [facility]) - Initial/Assessment Note    Patient Details  Name: [name] MRN: [id number] Date of Birth: April 26, 1956  Transition of Care [facility] CM/SW Contact:    Candie Chroman, LCSW Phone Number: 05/23/2022, 11:37 AM  Clinical Narrative:   CSW met with patient. No supports at bedside. CSW introduced role and explained that discharge planning would be discussed. Patient confirmed preference for returning home. No home health agency preference. Starting search. [facility] is reviewing. Asked MD to enter DME order for wheelchair.                Expected Discharge Plan: [place] Services Barriers to Discharge: Other (must enter comment) (Home health and DME setup)   Patient Goals and CMS Choice   CMS Medicare.gov Compare Post Acute Care list provided to:: Patient    Expected Discharge Plan and Services Expected Discharge Plan: [place] Choice: Durable Medical Equipment, Home Health Living arrangements for the past 2 months: Single Family Home                                      Prior Living Arrangements/Services Living arrangements for the past 2 months: Single Family Home Lives with:: Spouse Patient language and need for interpreter reviewed:: Yes Do you feel safe going back to the place where you live?: Yes      Need for Family Participation in Patient Care: Yes (Comment) Care giver support system in place?: Yes (comment) Current home services: DME Criminal Activity/Legal Involvement Pertinent to Current Situation/Hospitalization: No - Comment as needed  Activities of Daily Living Home Assistive Devices/Equipment: None ADL Screening (condition at time of admission) Patient's cognitive ability adequate to safely complete daily activities?: Yes Is the patient deaf or have difficulty hearing?: No Does the patient have difficulty seeing, even when wearing glasses/contacts?: No Does the patient have  difficulty concentrating, remembering, or making decisions?: No Patient able to express need for assistance with ADLs?: No Does the patient have difficulty dressing or bathing?: No Independently performs ADLs?: Yes (appropriate for developmental age) Does the patient have difficulty walking or climbing stairs?: No Weakness of Legs: None Weakness of Arms/Hands: None  Permission Sought/Granted Permission sought to share information with : Investment banker, corporate granted to share info w AGENCY: Home Health Agencies        Emotional Assessment Appearance:: Appears stated age Attitude/Demeanor/Rapport: Engaged, Gracious Affect (typically observed): Accepting, Appropriate, Calm, Pleasant Orientation: : Oriented to Self, Oriented to Place, Oriented to  Time, Oriented to Situation Alcohol / Substance Use: Not Applicable Psych Involvement: No (comment)  Admission diagnosis:  Cellulitis [L03.90] Cellulitis of left lower extremity [L03.116] 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 due to underlying condition, unspecified ulcer stage ([place]) [D32.202, L97.429] Patient Active Problem List   Diagnosis Date Noted   AMS (altered mental status) 05/21/2022   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 due to underlying condition ([place]) 05/17/2022   Hypertensive urgency 05/16/2022   Gangrene of left foot ([place]) 05/16/2022   Pyogenic inflammation of bone ([place]) 05/16/2022   Diabetic retinopathy associated with type 2 diabetes mellitus ([place]) 01/02/2022   Hypothyroidism 08/23/2021   Type 2 diabetes mellitus with diabetic neuropathy, unspecified ([place]) 04/16/2020   Cataracts, bilateral 04/16/2020   Sinus tachycardia 01/14/2020  Insomnia 01/07/2020   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 2 diabetes mellitus ([place]) 07/27/2017   Generalized anxiety disorder 07/27/2017   Chronic right flank pain 12/20/2015   Hyperkalemia 10/21/2014   Chest pain 09/21/2014    ADHD 06/22/2010   ERECTILE DYSFUNCTION, 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

## 2022-05-23 NOTE — Progress Notes (Signed)
Occupational Therapy Treatment Patient Details Name: [name] MRN: [id number] DOB: 1955-10-13 Today's Date: 05/23/2022   History of present illness Pt is a 66 y.o. male presenting to hospital 11/27 (sent by podiatrist for admission and IV antibiotics) for chronic nonhealing wound to L heel.  Pt admitted with cellulitis of L LE, chronic osteomyelitis with draining sinus (possible acute osteomyelitis), avulsion fx L calcaneus, hypertensive urgency, and atherosclerosis of native arteries of the extremities with ulceration.  S/p recanalization L LE 05/16/22 for limb salvage.  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  PMH includes DM, htn, viral meningitis.   OT comments  [name] was seen for OT treatment on this date. Upon arrival to room pt reclined in chair, family at bed side, agreeable to tx. Pt requires CGA for chair<>BSC t/f and chair <>bed t/f. CGA donning pants in sitting, MIN A + grab bar use to trial donning in standing. Extensive DME education and falls prevention strategies provided with family/pt. Pt continues to state preference for Rollator over RW, strongly recommend that all transfers be performed at w/c level on d/c home - do not recommend use of rollator for transfers. BKA exercise packet provided and HEP reviewed with good return demonstration. Pt making good progress toward goals, will continue to follow POC. Continue to recommend d/c to [facility] however pt choosing to go home, rec HHOT and w/c.     Recommendations for follow up therapy are one component of a multi-disciplinary discharge planning process, led by the attending physician.  Recommendations may be updated based on patient status, additional functional criteria and insurance authorization.    Follow Up Recommendations  Acute inpatient rehab (3hours/day)     Assistance Recommended at Discharge Frequent or constant Supervision/Assistance  Patient can return home with the following  A little help with walking and/or transfers;A little help with  bathing/dressing/bathroom   Equipment Recommendations  Wheelchair (measurements OT);Wheelchair cushion (measurements OT)    Recommendations for Other Services      Precautions / Restrictions Precautions Precautions: Fall Restrictions Weight Bearing Restrictions: No       Mobility Bed Mobility               General bed mobility comments: not assessed as patient sitting up on arrival and post session    Transfers Overall transfer level: Needs assistance Equipment used:  (grab bar) Transfers: Sit to/from Stand, Bed to chair/wheelchair/BSC Sit to Stand: Min assist          Lateral/Scoot Transfers: Min guard       Balance Overall balance assessment: Needs assistance Sitting-balance support: Feet supported Sitting balance-Leahy Scale: Good                                     ADL either performed or assessed with clinical judgement   ADL Overall ADL's : Needs assistance/impaired                                       General ADL Comments: CGA for BSC t/f, bed t/f, simulated w/c t/f. CGA donning pants insitting, grab bar use standing portion.      Cognition Arousal/Alertness: Awake/alert Behavior During Therapy: WFL for tasks assessed/performed Overall Cognitive Status: Within Functional Limits for tasks assessed  General Comments: decreased awareness of physical limitations and decreased insight into deficits.              General Comments patient continues to require physical assistance with sit to stand transfers. patient is insistant that his family/friends will provide support at discharge and he is still requesting to return home.    Pertinent Vitals/ Pain       Pain Assessment Pain Assessment: No/denies pain   Frequency  Min 4X/week        Progress Toward Goals  OT Goals(current goals can now be found in the care plan section)  Progress towards OT goals:  Progressing toward goals  Acute Rehab OT Goals Patient Stated Goal: to go home OT Goal Formulation: With patient/family Time For Goal Achievement: 06/03/22 Potential to Achieve Goals: Fair ADL Goals Pt Will Transfer to Toilet: with modified independence Pt Will Perform Toileting - Clothing Manipulation and hygiene: with modified independence;sit to/from stand  Plan Discharge plan remains appropriate;Frequency remains appropriate    Co-evaluation                 AM-PAC OT "6 Clicks" Daily Activity     Outcome Measure   Help from another person eating meals?: None Help from another person taking care of personal grooming?: A Little Help from another person toileting, which includes using toliet, bedpan, or urinal?: A Lot Help from another person bathing (including washing, rinsing, drying)?: A Lot Help from another person to put on and taking off regular upper body clothing?: A Little Help from another person to put on and taking off regular lower body clothing?: A Little 6 Click Score: 17    End of Session Equipment Utilized During Treatment: Rolling walker (2 wheels);Gait belt  OT Visit Diagnosis: Unsteadiness on feet (R26.81)   Activity Tolerance Patient tolerated treatment well   Patient Left in chair;with call bell/phone within reach;with family/visitor present   Nurse Communication Mobility status        Time: [id number] OT Time Calculation (min): 51 min  Charges: OT General Charges $OT Visit: 1 Visit OT Treatments $Self Care/Home Management : 38-52 mins  Dessie Coma, M.S. OTR/L  05/23/22, 2:39 PM  ascom [phone]

## 2022-05-24 ENCOUNTER — Telehealth (INDEPENDENT_AMBULATORY_CARE_PROVIDER_SITE_OTHER): Payer: Self-pay

## 2022-05-24 ENCOUNTER — Telehealth: Payer: Self-pay

## 2022-05-24 NOTE — Telephone Encounter (Signed)
Transition Care Management Unsuccessful Follow-up Telephone Call  Date of discharge and from where:  TCM DC [facility] 05-23-22 Dx: gangrene of left foot   Attempts:  1st Attempt  Reason for unsuccessful TCM follow-up call:  Left voice message   Woodfin Ganja LPN [facility] Nurse Health Advisor Direct Dial [phone]

## 2022-05-24 NOTE — Discharge Summary (Signed)
Physician Discharge Summary   Patient: [name] MRN: [id number] DOB: 10/13/1955  Admit date:     05/16/2022  Discharge date: 05/23/2022  Discharge Physician: Max Sane   PCP: Laurey Morale, MD   Recommendations at discharge:   Follow-up with outpatient providers as requested Zeroform over the staples. Then Abds to cover the zeroform. Then wrap with Kerlex. 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  Patient has seen podiatrist on 05/15/22 with acute foot pain and x-rays revealed an avulsion fracture of calcaneum raising the concern for possible acute osteomyelitis.  Patient was started on doxycycline for possible wound infection. Patient has also seen vascular surgery on 11/16 with recommendation for left leg angiography.  Patient is admitted for further evaluation.  Vascular surgery consulted , Patient underwent successful recanalization left lower extremity for limb salvage with two-vessel runoff.  Patient tolerated procedure well.  Podiatry and vascular surgery has scheduled for below-knee amputation on 12/1.  12/1: lt BKA amputation 12/2: PT, OT -  CIR, MRI brain - neg 12/3: Abx changed to PO Cipro + Amoxy 12/4: Dressing change by vascular surgery today.  CIR evaluation in progress  Assessment and Plan: * Gangrene of left foot (HCC) Acute on Chronic osteomyelitis with draining sinus of LLE Avulsion fracture of left calcaneus  MRI left foot confirmed osteomyelitis involving the posterior aspect of the calcaneous. S/p left BKA by vascular surgery on 05/19/22, POD 3 Patient adamant in leaving for home instead of inpatient rehab.  Multiple service line including vascular surgery, PT, OT social worker and myself tried to convince him go for rehabilitation but he and his wife were adamant in going home.  They were agreeable for home health services  Avulsion fracture of left calcaneus-resolved as of 05/21/2022 Patient was placed in a boot by podiatry on 11/27  AMS (altered mental status) Was transient and resolved. He is at his baseline MS.  MRI brain - neg on 12/2  Hypertensive urgency BP 230/83 on arrival - now resolved. Continue home medications  Hypothyroidism Continue levothyroxine  Type 2 diabetes mellitus with diabetic neuropathy, unspecified (HCC) Sliding scale insulin while in the hospital. A1c 6.6 (4 months ago)         Consultants: Vascular surgery Procedures performed: Left BKA on 05/19/2022 Disposition: Home health Diet recommendation:  Discharge Diet Orders (From admission, onward)     Start     Ordered   05/23/22 0000  Diet - low sodium heart healthy        05/23/22 1209           Carb modified diet DISCHARGE MEDICATION: Allergies as of 05/23/2022   No Known Allergies      Medication List  TAKE these medications    Accu-Chek Guide Me w/Device Kit USE AS DIRECTED EVERY DAY   Accu-Chek Guide test strip Generic drug: glucose blood Use to test blood sugars twice daily.   Accu-Chek Softclix Lancets lancets daily.   acetaminophen 500 MG tablet Commonly known as: TYLENOL Take 500 mg by  mouth in the morning and at bedtime.   ALPRAZolam 0.5 MG tablet Commonly known as: XANAX TAKE 1 TABLET (0.5 MG TOTAL) BY MOUTH 3 (THREE) TIMES DAILY AS NEEDED FOR ANXIETY OR SLEEP.   amLODipine 10 MG tablet Commonly known as: NORVASC Take 1 tablet (10 mg total) by mouth daily.   aspirin EC 81 MG tablet Take 1 tablet (81 mg total) by mouth daily. Swallow whole.   BD Pen Needle Micro U/F 32G X 6 MM Misc Generic drug: Insulin Pen Needle USE AS DIRECTED TO INJECT NOVILIN   clopidogrel 75 MG tablet Commonly known as: PLAVIX Take 1 tablet (75 mg total) by mouth daily.   doxycycline 100 MG tablet Commonly known as: VIBRA-TABS Take 1 tablet (100 mg total) by mouth 2 (two) times daily for 10 days.   furosemide 20 MG tablet Commonly known as: LASIX TAKE 1 TABLET (20 MG TOTAL) BY MOUTH DAILY AS NEEDED FOR FLUID. What changed: when to take this   ibuprofen 200 MG tablet Commonly known as: ADVIL Take 200 mg by mouth 2 (two) times daily.   levothyroxine 50 MCG tablet Commonly known as: SYNTHROID TAKE 1 TABLET BY MOUTH EVERY DAY   metoprolol succinate 25 MG 24 hr tablet Commonly known as: TOPROL-XL Take 1 tablet (25 mg total) by mouth daily.   multivitamin tablet Take 1 tablet by mouth daily.   NovoLIN 70/30 Kwikpen (70-30) 100 UNIT/ML KwikPen Generic drug: insulin isophane & regular human KwikPen Inject 25 Units into the skin 2 (two) times daily with a meal.   Santyl 250 UNIT/GM ointment Generic drug: collagenase Apply 1 Application topically daily.   temazepam 30 MG capsule Commonly known as: RESTORIL TAKE 1 CAPSULE BY MOUTH AT BEDTIME AS NEEDED FOR SLEEP        Follow-up Information     Laurey Morale, MD. Schedule an appointment as soon as possible for a visit in 2 day(s).   Specialty: Family Medicine Why: [facility] Discharge F/UP Contact information: [place] [address] [phone]         Drema Pry, NP. Schedule an  appointment as soon as possible for a visit in 2 week(s).   Specialty: Vascular Surgery Why: [facility] Discharge F/UP Contact information: [place] [address] [place], [place] Follow up.   Specialty: Home Health Services Why: They will follow up with you for your home health needs. Start of care 24-48 hours. Contact information: [address] [place] [address] [phone]                Discharge Exam: Danley Danker Weights   05/15/22 1911 05/19/22 1121 05/20/22 0414  Weight: 104 kg 104.3 kg 100.9 kg   Constitutional: NAD, AAOx3 HEENT: conjunctivae and lids normal, EOMI CV: No cyanosis.   RESP: normal respiratory effort, on RA Extremities: left BKA wrapped in Ace bandage SKIN: warm, dry Neuro: II - XII grossly intact.   Psych: Normal mood and affect.  Appropriate judgement and reason  Condition at discharge: fair  The results of significant diagnostics from this hospitalization (including imaging,  microbiology, ancillary and laboratory) are listed below for reference.   Imaging Studies: MR BRAIN WO CONTRAST  Result Date: 05/20/2022 CLINICAL DATA:  Transient ischemic attack EXAM: MRI HEAD WITHOUT CONTRAST TECHNIQUE: Multiplanar, multiecho pulse sequences of the brain and surrounding structures were obtained without intravenous contrast. COMPARISON:  None Available. FINDINGS: Brain: No acute infarct, mass effect or extra-axial collection. No acute or chronic hemorrhage. There is multifocal hyperintense T2-weighted signal within the white matter. Parenchymal volume and CSF spaces are normal. The midline structures are normal. Vascular: Major flow voids are preserved. Skull and upper cervical spine: Normal calvarium and skull base. Visualized upper cervical spine and soft tissues are normal. Sinuses/Orbits:No paranasal sinus fluid levels or advanced mucosal thickening. No mastoid or middle ear effusion. Normal orbits.  IMPRESSION: 1. No acute intracranial abnormality. 2. Multifocal hyperintense T2-weighted signal within the white matter, nonspecific but most commonly seen in the setting of chronic small vessel ischemia. Electronically Signed   By: Ulyses Jarred M.D.   On: 05/20/2022 23:03   PERIPHERAL VASCULAR CATHETERIZATION  Result Date: 05/16/2022 See surgical note for result.  MR HEEL LEFT WO CONTRAST  Result Date: 05/16/2022 CLINICAL DATA:  Infected heel wound. EXAM: MR OF THE LEFT HEEL WITHOUT CONTRAST TECHNIQUE: Multiplanar, multisequence MR imaging of the left hindfoot was performed. No intravenous contrast was administered. COMPARISON:  Radiographs 05/15/2022 FINDINGS: There is an open wound located along the posterior aspect of the heel acute extends down close to the bone. There is underlying abnormal T1 and T2 signal intensity in the posterior aspect of the calcaneus consistent with osteomyelitis. The tibiotalar, subtalar and midfoot joint spaces are maintained. No findings suspicious for septic arthritis and no other sites of osteomyelitis. There is diffuse cellulitis and myofasciitis without definite findings for pyomyositis. The Achilles tendon is ruptured from its distal attachment site and retracted approximately 2.5 cm. This could be pathologic and related to septic tendinopathy. The medial and lateral ankle ligaments and tendons are intact. Plantar fascia is intact. IMPRESSION: 1. 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 2. Diffuse cellulitis and myofasciitis without definite findings for focal subcutaneous abscess or pyomyositis. 3. Ruptured Achilles tendon from its distal attachment site and retracted approximately 2.5 cm. This is likely pathologic and related to septic tendinopathy. Electronically Signed   By: Marijo Sanes M.D.   On: 05/16/2022 07:36   DG Foot Complete Left  Result Date: 05/15/2022 Please  see detailed radiograph report in office note.  VAS [place] ABI WITH/WO TBI  Result Date: 05/08/2022  LOWER EXTREMITY DOPPLER STUDY Patient Name:  JAVID KEMLER  Date of Exam:   05/04/2022 Medical Rec #: [id number]     Accession #:    [id number] Date of Birth: 03/09/1956      Patient Gender: M Patient Age:   29 years Exam Location:  [facility] Procedure:      VAS [place] ABI WITH/WO TBI Referring Phys: Leotis Pain --------------------------------------------------------------------------------  Indications: Ulceration. left heel x 10 months  Comparison Study: 08/18/2021 Performing Technologist: Concha Norway RVT  Examination Guidelines: A complete evaluation includes at minimum, Doppler waveform signals and systolic blood pressure reading at the level of bilateral brachial, anterior tibial, and posterior tibial arteries, when vessel segments are accessible. Bilateral testing is considered an integral part of a complete examination. Photoelectric Plethysmograph (PPG) waveforms and toe systolic pressure readings are included as required and additional duplex testing as needed. Limited examinations for  reoccurring indications may be performed as noted.  ABI Findings: +---------+------------------+-----+----------+--------+ Right    Rt Pressure (mmHg)IndexWaveform  Comment  +---------+------------------+-----+----------+--------+ Brachial 184                                       +---------+------------------+-----+----------+--------+ ATA      176               0.94 monophasic         +---------+------------------+-----+----------+--------+ PTA      188               1.01 biphasic           +---------+------------------+-----+----------+--------+ Great Toe132               0.71 Normal             +---------+------------------+-----+----------+--------+ +---------+------------------+-----+--------+-------+ Left     Lt Pressure (mmHg)IndexWaveformComment  +---------+------------------+-----+--------+-------+ Brachial 187                                    +---------+------------------+-----+--------+-------+ ATA      183               0.98 biphasic        +---------+------------------+-----+--------+-------+ PTA      182               0.97 biphasic        +---------+------------------+-----+--------+-------+ Great Toe101               0.54 Abnormal        +---------+------------------+-----+--------+-------+ +-------+-----------+-----------+------------+------------+ ABI/TBIToday's ABIToday's TBIPrevious ABIPrevious TBI +-------+-----------+-----------+------------+------------+ Right  1.01       .71        1.05        .50          +-------+-----------+-----------+------------+------------+ Left   .98        .54        .98         .51          +-------+-----------+-----------+------------+------------+ Right TBIs appear increased compared to prior study on 08/2021. Left ABIs and TBIs appear essentially unchanged.  Summary: Right: Resting right ankle-brachial index is within normal range. The right toe-brachial index is normal. Left: Resting left ankle-brachial index is within normal range. The left toe-brachial index is abnormal. *See table(s) above for measurements and observations.  Electronically signed by Hortencia Pilar MD on 05/08/2022 at 4:58:27 PM.    Final     Microbiology: Results for orders placed or performed during the hospital encounter of 05/16/22  Blood culture (routine x 2)     Status: None   Collection Time: 05/16/22  1:33 AM   Specimen: BLOOD  Result Value Ref Range Status   Specimen Description BLOOD LEFT [facility]  Final   Special Requests   Final    BOTTLES DRAWN AEROBIC AND ANAEROBIC Blood Culture adequate volume   Culture   Final    NO GROWTH 5 DAYS Performed at [facility], [address], [place], [place] [address]    Report Status 05/21/2022 FINAL  Final  Blood culture (routine x  2)     Status: None   Collection Time: 05/16/22  1:33 AM   Specimen: BLOOD RIGHT HAND  Result Value Ref Range Status   Specimen Description BLOOD RIGHT HAND  Final   Special Requests   Final    BOTTLES DRAWN AEROBIC AND ANAEROBIC Blood Culture adequate volume   Culture   Final    NO GROWTH 5 DAYS Performed at [facility], [address] [place], [place] [address]    Report Status 05/21/2022 FINAL  Final  Aerobic/Anaerobic Culture w Gram Stain (surgical/deep wound)     Status: None   Collection Time: 05/16/22  3:51 AM   Specimen: Heel  Result Value Ref Range Status   Specimen Description   Final    HEEL Performed at [facility], [address], [place], [place] [address]    Special Requests   Final    NONE Performed at [facility], [address] [place], [place] [address]    Gram Stain   Final    RARE WBC PRESENT,BOTH PMN AND MONONUCLEAR FEW GRAM POSITIVE COCCI IN PAIRS FEW GRAM POSITIVE RODS FEW GRAM NEGATIVE RODS    Culture   Final    ABUNDANT PSEUDOMONAS AERUGINOSA FEW ENTEROCOCCUS FAECALIS ABUNDANT DIPHTHEROIDS(CORYNEBACTERIUM SPECIES) Standardized susceptibility testing for this organism is not available. NO ANAEROBES ISOLATED Performed at [facility], [place] [address], [place], [place] [address]    Report Status 05/21/2022 FINAL  Final   Organism ID, Bacteria PSEUDOMONAS AERUGINOSA  Final   Organism ID, Bacteria ENTEROCOCCUS FAECALIS  Final      Susceptibility   Enterococcus faecalis - MIC*    AMPICILLIN <=2 SENSITIVE Sensitive     VANCOMYCIN 1 SENSITIVE Sensitive     GENTAMICIN SYNERGY SENSITIVE Sensitive     * FEW ENTEROCOCCUS FAECALIS   Pseudomonas aeruginosa - MIC*    CEFTAZIDIME 2 SENSITIVE Sensitive     CIPROFLOXACIN <=0.25 SENSITIVE Sensitive     GENTAMICIN <=1 SENSITIVE Sensitive     IMIPENEM <=0.25 SENSITIVE Sensitive     PIP/TAZO <=4 SENSITIVE Sensitive     CEFEPIME 2 SENSITIVE Sensitive     * ABUNDANT  PSEUDOMONAS AERUGINOSA  Surgical pcr screen     Status: None   Collection Time: 05/18/22  6:00 AM   Specimen: Nasal Mucosa; Nasal Swab  Result Value Ref Range Status   MRSA, PCR NEGATIVE NEGATIVE Final   Staphylococcus aureus NEGATIVE NEGATIVE Final    Comment: (NOTE) The Xpert SA Assay (FDA approved for NASAL specimens in patients 13 years of age and older), is one component of a comprehensive surveillance program. It is not intended to diagnose infection nor to guide or monitor treatment. Performed at [facility], [address] [place],  [address]     Labs: CBC: Recent Labs  Lab 05/18/22 [phone] 05/21/22 0555 05/22/22 0500  WBC 10.7* 9.0 13.0*  HGB 10.8* 7.2* 7.8*  HCT 32.1* 21.0* 23.4*  MCV 92.8 92.1 95.5  PLT 239 202 408   Basic Metabolic Panel: Recent Labs  Lab 05/18/22 0605 05/19/22 0343 05/20/22 0452 05/21/22 0555 05/22/22 0500  NA 140  --   --  136 136  K 3.9  --   --  3.5 3.6  CL 112*  --   --  111 109  CO2 22  --   --  18* 17*  GLUCOSE 127*  --   --  99 133*  BUN 19  --   --  26* 31*  CREATININE 0.75 0.82 1.07 1.16 1.45*  CALCIUM 8.7*  --   --  7.9* 7.8*  MG 2.1  --   --  2.1  --   PHOS 2.7  --   --   --   --  Liver Function Tests: No results for input(s): "AST", "ALT", "ALKPHOS", "BILITOT", "PROT", "ALBUMIN" in the last 168 hours. CBG: Recent Labs  Lab 05/22/22 2027 05/22/22 2330 05/23/22 0510 05/23/22 0805 05/23/22 1231  GLUCAP 151* 109* 107* 127* 177*    Discharge time spent: greater than 30 minutes.  Signed: Max Sane, MD [facility] 05/24/2022

## 2022-05-24 NOTE — Telephone Encounter (Signed)
Patient spouse was made aware with medical advice and verbalized understanding. 

## 2022-05-24 NOTE — Telephone Encounter (Signed)
As [place] as the cover is not tight.  We don't want anything that will put at the incision

## 2022-05-25 ENCOUNTER — Telehealth: Payer: Self-pay | Admitting: Family Medicine

## 2022-05-25 NOTE — Telephone Encounter (Signed)
Loralie Champagne RN with [facility] is calling to report the pt does not want to start hh service until 05-29-2022

## 2022-05-25 NOTE — Telephone Encounter (Signed)
Transition Care Management Follow-up Telephone Call Date of discharge and from where: TCM DC [facility] 05-23-22 Dx: gangrene of left foot  How have you been since you were released from the hospital? Feeling better  Any questions or concerns? No  Items Reviewed: Did the pt receive and understand the discharge instructions provided? Yes  Medications obtained and verified? Yes  Other? No  Any new allergies since your discharge? No  Dietary orders reviewed? Yes Do you have support at home? Yes   Home Care and Equipment/Supplies: Were home health services ordered? yes If so, what is the name of the agency? [facility]   Has the agency set up a time to come to the patient's home? yes Were any new equipment or medical supplies ordered?  Yes: wheelchair What is the name of the medical supply agency? Hospital  Were you able to get the supplies/equipment? yes Do you have any questions related to the use of the equipment or supplies? No  Functional Questionnaire: (I = Independent and D = Dependent) ADLs: I  Bathing/Dressing- I  Meal Prep- I  Eating- I  Maintaining continence- I  Transferring/Ambulation- I  Managing Meds- D- WIFE MANAGES   Follow up appointments reviewed:  PCP Hospital f/u appt confirmed? Yes  Scheduled to see Dr Clent Ridges on 05-31-22 @ 2pm. Specialist [facility] f/u appt confirmed? Yes  Scheduled to see Dr Manson Passey  on 06-08-22 @ 230pm. Are transportation arrangements needed? No  If their condition worsens, is the pt aware to call PCP or go to the Emergency Dept.? Yes Was the patient provided with contact information for the PCP's office or ED? Yes Was to pt encouraged to call back with questions or concerns? Yes   Woodfin Ganja LPN [facility] Nurse Health Advisor Direct Dial [phone]

## 2022-05-26 ENCOUNTER — Ambulatory Visit: Payer: No Typology Code available for payment source | Admitting: Podiatry

## 2022-05-27 ENCOUNTER — Inpatient Hospital Stay
Admission: EM | Admit: 2022-05-27 | Discharge: 2022-06-04 | DRG: 286 | Disposition: A | Discharge status: Home Health | Payer: No Typology Code available for payment source | Attending: Internal Medicine | Admitting: Internal Medicine

## 2022-05-27 ENCOUNTER — Other Ambulatory Visit: Payer: Self-pay

## 2022-05-27 ENCOUNTER — Emergency Department: Payer: No Typology Code available for payment source

## 2022-05-27 DIAGNOSIS — I5023 Acute on chronic systolic (congestive) heart failure: Secondary | ICD-10-CM | POA: Diagnosis present

## 2022-05-27 DIAGNOSIS — Z89512 Acquired absence of left leg below knee: Secondary | ICD-10-CM | POA: Diagnosis not present

## 2022-05-27 DIAGNOSIS — I5033 Acute on chronic diastolic (congestive) heart failure: Secondary | ICD-10-CM | POA: Diagnosis not present

## 2022-05-27 DIAGNOSIS — Z20822 Contact with and (suspected) exposure to covid-19: Secondary | ICD-10-CM | POA: Diagnosis present

## 2022-05-27 DIAGNOSIS — J189 Pneumonia, unspecified organism: Secondary | ICD-10-CM | POA: Diagnosis present

## 2022-05-27 DIAGNOSIS — J810 Acute pulmonary edema: Principal | ICD-10-CM

## 2022-05-27 DIAGNOSIS — I420 Dilated cardiomyopathy: Secondary | ICD-10-CM

## 2022-05-27 DIAGNOSIS — I083 Combined rheumatic disorders of mitral, aortic and tricuspid valves: Secondary | ICD-10-CM | POA: Diagnosis present

## 2022-05-27 DIAGNOSIS — R590 Localized enlarged lymph nodes: Secondary | ICD-10-CM | POA: Diagnosis present

## 2022-05-27 DIAGNOSIS — E0781 Sick-euthyroid syndrome: Secondary | ICD-10-CM | POA: Diagnosis present

## 2022-05-27 DIAGNOSIS — E1151 Type 2 diabetes mellitus with diabetic peripheral angiopathy without gangrene: Secondary | ICD-10-CM | POA: Diagnosis present

## 2022-05-27 DIAGNOSIS — Z7982 Long term (current) use of aspirin: Secondary | ICD-10-CM

## 2022-05-27 DIAGNOSIS — I25118 Atherosclerotic heart disease of native coronary artery with other forms of angina pectoris: Secondary | ICD-10-CM | POA: Diagnosis present

## 2022-05-27 DIAGNOSIS — I5020 Unspecified systolic (congestive) heart failure: Secondary | ICD-10-CM | POA: Diagnosis not present

## 2022-05-27 DIAGNOSIS — R778 Other specified abnormalities of plasma proteins: Secondary | ICD-10-CM | POA: Diagnosis not present

## 2022-05-27 DIAGNOSIS — Z7902 Long term (current) use of antithrombotics/antiplatelets: Secondary | ICD-10-CM

## 2022-05-27 DIAGNOSIS — I5043 Acute on chronic combined systolic (congestive) and diastolic (congestive) heart failure: Secondary | ICD-10-CM | POA: Diagnosis not present

## 2022-05-27 DIAGNOSIS — E1165 Type 2 diabetes mellitus with hyperglycemia: Secondary | ICD-10-CM

## 2022-05-27 DIAGNOSIS — E1140 Type 2 diabetes mellitus with diabetic neuropathy, unspecified: Secondary | ICD-10-CM | POA: Diagnosis present

## 2022-05-27 DIAGNOSIS — Z791 Long term (current) use of non-steroidal anti-inflammatories (NSAID): Secondary | ICD-10-CM

## 2022-05-27 DIAGNOSIS — I130 Hypertensive heart and chronic kidney disease with heart failure and stage 1 through stage 4 chronic kidney disease, or unspecified chronic kidney disease: Secondary | ICD-10-CM | POA: Diagnosis present

## 2022-05-27 DIAGNOSIS — E1169 Type 2 diabetes mellitus with other specified complication: Secondary | ICD-10-CM | POA: Diagnosis not present

## 2022-05-27 DIAGNOSIS — J90 Pleural effusion, not elsewhere classified: Secondary | ICD-10-CM | POA: Diagnosis not present

## 2022-05-27 DIAGNOSIS — R7989 Other specified abnormal findings of blood chemistry: Secondary | ICD-10-CM

## 2022-05-27 DIAGNOSIS — Z8249 Family history of ischemic heart disease and other diseases of the circulatory system: Secondary | ICD-10-CM

## 2022-05-27 DIAGNOSIS — I5031 Acute diastolic (congestive) heart failure: Secondary | ICD-10-CM | POA: Diagnosis not present

## 2022-05-27 DIAGNOSIS — E1122 Type 2 diabetes mellitus with diabetic chronic kidney disease: Secondary | ICD-10-CM | POA: Diagnosis present

## 2022-05-27 DIAGNOSIS — E11649 Type 2 diabetes mellitus with hypoglycemia without coma: Secondary | ICD-10-CM | POA: Diagnosis not present

## 2022-05-27 DIAGNOSIS — E872 Acidosis, unspecified: Secondary | ICD-10-CM | POA: Diagnosis not present

## 2022-05-27 DIAGNOSIS — I272 Pulmonary hypertension, unspecified: Secondary | ICD-10-CM | POA: Diagnosis present

## 2022-05-27 DIAGNOSIS — Z87442 Personal history of urinary calculi: Secondary | ICD-10-CM

## 2022-05-27 DIAGNOSIS — E876 Hypokalemia: Secondary | ICD-10-CM | POA: Diagnosis present

## 2022-05-27 DIAGNOSIS — R12 Heartburn: Secondary | ICD-10-CM | POA: Diagnosis not present

## 2022-05-27 DIAGNOSIS — D62 Acute posthemorrhagic anemia: Secondary | ICD-10-CM | POA: Diagnosis present

## 2022-05-27 DIAGNOSIS — E118 Type 2 diabetes mellitus with unspecified complications: Secondary | ICD-10-CM

## 2022-05-27 DIAGNOSIS — Z833 Family history of diabetes mellitus: Secondary | ICD-10-CM

## 2022-05-27 DIAGNOSIS — I739 Peripheral vascular disease, unspecified: Secondary | ICD-10-CM | POA: Diagnosis not present

## 2022-05-27 DIAGNOSIS — I255 Ischemic cardiomyopathy: Secondary | ICD-10-CM

## 2022-05-27 DIAGNOSIS — I2489 Other forms of acute ischemic heart disease: Secondary | ICD-10-CM

## 2022-05-27 DIAGNOSIS — J9811 Atelectasis: Secondary | ICD-10-CM | POA: Diagnosis present

## 2022-05-27 DIAGNOSIS — Z79899 Other long term (current) drug therapy: Secondary | ICD-10-CM

## 2022-05-27 DIAGNOSIS — J9601 Acute respiratory failure with hypoxia: Secondary | ICD-10-CM | POA: Diagnosis not present

## 2022-05-27 DIAGNOSIS — I2510 Atherosclerotic heart disease of native coronary artery without angina pectoris: Secondary | ICD-10-CM | POA: Diagnosis not present

## 2022-05-27 DIAGNOSIS — Z9049 Acquired absence of other specified parts of digestive tract: Secondary | ICD-10-CM

## 2022-05-27 DIAGNOSIS — Z8661 Personal history of infections of the central nervous system: Secondary | ICD-10-CM

## 2022-05-27 DIAGNOSIS — Z87891 Personal history of nicotine dependence: Secondary | ICD-10-CM

## 2022-05-27 DIAGNOSIS — Z79890 Hormone replacement therapy (postmenopausal): Secondary | ICD-10-CM

## 2022-05-27 DIAGNOSIS — N179 Acute kidney failure, unspecified: Secondary | ICD-10-CM

## 2022-05-27 DIAGNOSIS — R0602 Shortness of breath: Secondary | ICD-10-CM | POA: Diagnosis present

## 2022-05-27 DIAGNOSIS — Z794 Long term (current) use of insulin: Secondary | ICD-10-CM

## 2022-05-27 DIAGNOSIS — D649 Anemia, unspecified: Secondary | ICD-10-CM

## 2022-05-27 DIAGNOSIS — N1831 Chronic kidney disease, stage 3a: Secondary | ICD-10-CM | POA: Diagnosis present

## 2022-05-27 DIAGNOSIS — E039 Hypothyroidism, unspecified: Secondary | ICD-10-CM | POA: Diagnosis not present

## 2022-05-27 HISTORY — DX: Chronic systolic (congestive) heart failure: I50.22

## 2022-05-27 HISTORY — DX: Nonrheumatic mitral (valve) insufficiency: I34.0

## 2022-05-27 HISTORY — DX: Rheumatic tricuspid insufficiency: I07.1

## 2022-05-27 HISTORY — DX: Ischemic cardiomyopathy: I25.5

## 2022-05-27 HISTORY — DX: Peripheral vascular disease, unspecified: I73.9

## 2022-05-27 HISTORY — DX: Atherosclerotic heart disease of native coronary artery without angina pectoris: I25.10

## 2022-05-27 LAB — LACTIC ACID, PLASMA: Lactic Acid, Venous: 1.6 mmol/L (ref 0.5–1.9)

## 2022-05-27 LAB — TROPONIN I (HIGH SENSITIVITY)
Troponin I (High Sensitivity): 803 ng/L (ref <18)
Troponin I (High Sensitivity): 734 ng/L (ref <18)

## 2022-05-27 LAB — COMPREHENSIVE METABOLIC PANEL
ALT: 24 U/L (ref 0–44)
AST: 23 U/L (ref 15–41)
Albumin: 2.8 g/dL — ABNORMAL LOW (ref 3.5–5.0)
Alkaline Phosphatase: 95 U/L (ref 38–126)
Anion gap: 10 (ref 5–15)
BUN: 59 mg/dL — ABNORMAL HIGH (ref 8–23)
CO2: 19 mmol/L — ABNORMAL LOW (ref 22–32)
Calcium: 8.3 mg/dL — ABNORMAL LOW (ref 8.9–10.3)
Chloride: 99 mmol/L (ref 98–111)
Creatinine, Ser: 1.66 mg/dL — ABNORMAL HIGH (ref 0.61–1.24)
GFR, Estimated: 46 mL/min — ABNORMAL LOW (ref >60)
Glucose, Bld: 208 mg/dL — ABNORMAL HIGH (ref 70–99)
Potassium: 3.6 mmol/L (ref 3.5–5.1)
Sodium: 128 mmol/L — ABNORMAL LOW (ref 135–145)
Total Bilirubin: 0.8 mg/dL (ref 0.3–1.2)
Total Protein: 7.5 g/dL (ref 6.5–8.1)

## 2022-05-27 LAB — CBC WITH DIFFERENTIAL/PLATELET
Abs Immature Granulocytes: 0.25 10*3/uL — ABNORMAL HIGH (ref 0.00–0.07)
Basophils Absolute: 0.1 10*3/uL (ref 0.0–0.1)
Basophils Relative: 1 %
Eosinophils Absolute: 0.2 10*3/uL (ref 0.0–0.5)
Eosinophils Relative: 1 %
HCT: 29.1 % — ABNORMAL LOW (ref 39.0–52.0)
Hemoglobin: 9.6 g/dL — ABNORMAL LOW (ref 13.0–17.0)
Immature Granulocytes: 2 %
Lymphocytes Relative: 12 %
Lymphs Abs: 1.6 10*3/uL (ref 0.7–4.0)
MCH: 31.5 pg (ref 26.0–34.0)
MCHC: 33 g/dL (ref 30.0–36.0)
MCV: 95.4 fL (ref 80.0–100.0)
Monocytes Absolute: 0.8 10*3/uL (ref 0.1–1.0)
Monocytes Relative: 6 %
Neutro Abs: 10 10*3/uL — ABNORMAL HIGH (ref 1.7–7.7)
Neutrophils Relative %: 78 %
Platelets: 429 10*3/uL — ABNORMAL HIGH (ref 150–400)
RBC: 3.05 MIL/uL — ABNORMAL LOW (ref 4.22–5.81)
RDW: 14.2 % (ref 11.5–15.5)
WBC: 12.9 10*3/uL — ABNORMAL HIGH (ref 4.0–10.5)
nRBC: 0 % (ref 0.0–0.2)

## 2022-05-27 LAB — RESP PANEL BY RT-PCR (RSV, FLU A&B, COVID)  RVPGX2
Influenza A by PCR: NEGATIVE
Influenza B by PCR: NEGATIVE
Resp Syncytial Virus by PCR: NEGATIVE
SARS Coronavirus 2 by RT PCR: NEGATIVE

## 2022-05-27 LAB — URINALYSIS, ROUTINE W REFLEX MICROSCOPIC
Bilirubin Urine: NEGATIVE
Glucose, UA: NEGATIVE mg/dL
Hgb urine dipstick: NEGATIVE
Ketones, ur: NEGATIVE mg/dL
Leukocytes,Ua: NEGATIVE
Nitrite: NEGATIVE
Protein, ur: NEGATIVE mg/dL
Specific Gravity, Urine: 1.019 (ref 1.005–1.030)
pH: 5 (ref 5.0–8.0)

## 2022-05-27 LAB — PROTIME-INR
INR: 1.1 (ref 0.8–1.2)
Prothrombin Time: 14.5 seconds (ref 11.4–15.2)

## 2022-05-27 LAB — BRAIN NATRIURETIC PEPTIDE: B Natriuretic Peptide: 1547 pg/mL — ABNORMAL HIGH (ref 0.0–100.0)

## 2022-05-27 MED ORDER — FUROSEMIDE 10 MG/ML IJ SOLN
40.0000 mg | Freq: Two times a day (BID) | INTRAMUSCULAR | Status: DC
  Administered 2022-05-28: 40 mg via INTRAVENOUS
  Filled 2022-05-27: qty 4

## 2022-05-27 MED ORDER — LEVOTHYROXINE SODIUM 50 MCG PO TABS
50.0000 ug | ORAL_TABLET | Freq: Every day | ORAL | Status: DC
  Administered 2022-05-28 – 2022-06-04 (×8): 50 ug via ORAL
  Filled 2022-05-27 (×8): qty 1

## 2022-05-27 MED ORDER — ALBUTEROL SULFATE (2.5 MG/3ML) 0.083% IN NEBU
2.5000 mg | INHALATION_SOLUTION | RESPIRATORY_TRACT | Status: DC | PRN
  Administered 2022-05-28: 2.5 mg via RESPIRATORY_TRACT
  Filled 2022-05-27: qty 3

## 2022-05-27 MED ORDER — CLOPIDOGREL BISULFATE 75 MG PO TABS
75.0000 mg | ORAL_TABLET | Freq: Every day | ORAL | Status: DC
  Administered 2022-05-28 – 2022-06-04 (×7): 75 mg via ORAL
  Filled 2022-05-27 (×7): qty 1

## 2022-05-27 MED ORDER — ONDANSETRON HCL 4 MG PO TABS: 4.0000 mg | ORAL_TABLET | Freq: Four times a day (QID) | ORAL | Status: DC | PRN

## 2022-05-27 MED ORDER — SODIUM CHLORIDE 0.9 % IV SOLN
1.0000 g | Freq: Once | INTRAVENOUS | Status: AC
  Administered 2022-05-27: 1 g via INTRAVENOUS
  Filled 2022-05-27: qty 10

## 2022-05-27 MED ORDER — TEMAZEPAM 15 MG PO CAPS: 15.0000 mg | ORAL_CAPSULE | Freq: Every evening | ORAL | Status: DC | PRN

## 2022-05-27 MED ORDER — SODIUM CHLORIDE 0.9 % IV SOLN
1.0000 g | Freq: Once | INTRAVENOUS | Status: AC
  Administered 2022-05-28: 1 g via INTRAVENOUS
  Filled 2022-05-27: qty 10

## 2022-05-27 MED ORDER — MORPHINE SULFATE (PF) 2 MG/ML IV SOLN: 2.0000 mg | INTRAVENOUS | Status: DC | PRN

## 2022-05-27 MED ORDER — SODIUM CHLORIDE 0.9 % IV SOLN
500.0000 mg | INTRAVENOUS | Status: AC
  Administered 2022-05-28 – 2022-05-31 (×4): 500 mg via INTRAVENOUS
  Filled 2022-05-27 (×4): qty 5

## 2022-05-27 MED ORDER — SODIUM CHLORIDE 0.9 % IV SOLN
2.0000 g | INTRAVENOUS | Status: AC
  Administered 2022-05-28 – 2022-05-31 (×4): 2 g via INTRAVENOUS
  Filled 2022-05-27: qty 20
  Filled 2022-05-27: qty 2
  Filled 2022-05-27 (×2): qty 20

## 2022-05-27 MED ORDER — INSULIN ASPART 100 UNIT/ML IJ SOLN
0.0000 [IU] | INTRAMUSCULAR | Status: DC
  Administered 2022-05-28: 2 [IU] via SUBCUTANEOUS
  Administered 2022-05-28: 5 [IU] via SUBCUTANEOUS
  Administered 2022-05-28: 3 [IU] via SUBCUTANEOUS
  Administered 2022-05-28: 2 [IU] via SUBCUTANEOUS
  Administered 2022-05-28: 3 [IU] via SUBCUTANEOUS
  Administered 2022-05-28: 2 [IU] via SUBCUTANEOUS
  Administered 2022-05-28: 3 [IU] via SUBCUTANEOUS
  Administered 2022-05-29 (×3): 2 [IU] via SUBCUTANEOUS
  Administered 2022-05-29: 3 [IU] via SUBCUTANEOUS
  Administered 2022-05-30 – 2022-05-31 (×3): 2 [IU] via SUBCUTANEOUS
  Administered 2022-05-31 (×2): 3 [IU] via SUBCUTANEOUS
  Administered 2022-05-31: 2 [IU] via SUBCUTANEOUS
  Administered 2022-05-31: 3 [IU] via SUBCUTANEOUS
  Administered 2022-06-01: 2 [IU] via SUBCUTANEOUS
  Administered 2022-06-02 – 2022-06-03 (×4): 3 [IU] via SUBCUTANEOUS
  Administered 2022-06-03: 2 [IU] via SUBCUTANEOUS
  Administered 2022-06-04: 3 [IU] via SUBCUTANEOUS
  Filled 2022-05-27 (×25): qty 1

## 2022-05-27 MED ORDER — ACETAMINOPHEN 325 MG PO TABS: 650.0000 mg | ORAL_TABLET | Freq: Four times a day (QID) | ORAL | Status: DC | PRN

## 2022-05-27 MED ORDER — INSULIN GLARGINE-YFGN 100 UNIT/ML ~~LOC~~ SOLN
5.0000 [IU] | Freq: Every day | SUBCUTANEOUS | Status: DC
  Administered 2022-05-28: 5 [IU] via SUBCUTANEOUS
  Filled 2022-05-27: qty 0.05

## 2022-05-27 MED ORDER — FUROSEMIDE 10 MG/ML IJ SOLN
40.0000 mg | Freq: Once | INTRAMUSCULAR | Status: AC
  Administered 2022-05-27: 40 mg via INTRAVENOUS
  Filled 2022-05-27: qty 4

## 2022-05-27 MED ORDER — SODIUM CHLORIDE 0.9 % IV SOLN
500.0000 mg | Freq: Once | INTRAVENOUS | Status: AC
  Administered 2022-05-27: 500 mg via INTRAVENOUS
  Filled 2022-05-27: qty 5

## 2022-05-27 MED ORDER — ACETAMINOPHEN 650 MG RE SUPP: 650.0000 mg | Freq: Four times a day (QID) | RECTAL | Status: DC | PRN

## 2022-05-27 MED ORDER — ASPIRIN 81 MG PO TBEC
81.0000 mg | DELAYED_RELEASE_TABLET | Freq: Every day | ORAL | Status: DC
  Administered 2022-05-28 – 2022-06-04 (×7): 81 mg via ORAL
  Filled 2022-05-27 (×7): qty 1

## 2022-05-27 MED ORDER — ONDANSETRON HCL 4 MG/2ML IJ SOLN: 4.0000 mg | Freq: Four times a day (QID) | INTRAMUSCULAR | Status: DC | PRN

## 2022-05-27 MED ORDER — METOPROLOL SUCCINATE ER 25 MG PO TB24
25.0000 mg | ORAL_TABLET | Freq: Every day | ORAL | Status: DC
  Administered 2022-05-28 – 2022-06-04 (×7): 25 mg via ORAL
  Filled 2022-05-27 (×7): qty 1

## 2022-05-27 MED ORDER — IOHEXOL 350 MG/ML SOLN
75.0000 mL | Freq: Once | INTRAVENOUS | Status: AC | PRN
  Administered 2022-05-27: 75 mL via INTRAVENOUS

## 2022-05-27 MED ORDER — HYDROCODONE-ACETAMINOPHEN 5-325 MG PO TABS: 1.0000 | ORAL_TABLET | ORAL | Status: DC | PRN

## 2022-05-27 NOTE — ED Triage Notes (Signed)
Pt states he is having some SHOB and weakness since Tuesday night- pt had his L leg amputated- pt denies CP and denies CP with deep breath- pt was placed on plavix after the surgery

## 2022-05-27 NOTE — Assessment & Plan Note (Addendum)
Possibly related to recent admission and IV fluid administration. Patient with associated large pleural effusions with vascular congestion. BNP of 1,547. Patient started on Lasix IV and cardiology consulted. Transthoracic Echocardiogram obtained and confirms severely reduce LVEF of 25-30% with associated regional wall motion abnormalities and grade 2 diastolic dysfunction. Advanced heart failure team is consulted. Daily weights ordered but still have not been documented after continued requests. -Continue Lasix IV -Strict in/out and daily weights (standing, if able) -Cardiology recommendations: Lasix, metoprolol, spironolactone, plan for R/L heart catheterization -Obtain weight now (discussed with nursing directly)

## 2022-05-27 NOTE — Assessment & Plan Note (Deleted)
Bilateral pleural effusion Suspect secondary to fluid administration/overload in the recent perioperative from 12/1 to 12/5 Patient has no prior history of CHF Continue IV Lasix Continue home metoprolol Daily weights with intake and output monitoring

## 2022-05-27 NOTE — H&P (Signed)
History and Physical    Patient: [name] GBT:[id number] DOB: [birth year] DOA: 05/27/2022 DOS: the patient was seen and examined on 05/27/2022 PCP: Laurey Morale, MD  Patient coming from: Home  Chief Complaint:  Chief Complaint  Patient presents with   Shortness of Breath    HPI: [name] is a 66 y.o. 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  He started having dyspnea on exertion and now developed orthopnea and then acutely worsening shortness of breath.  He denies chest pain.  Denies pain in the right leg.  He has no cough fever or chills. ED course and data review: BP 130/67 with pulse 97 and afebrile.  Tachypneic to 22 with O2 sat 87% on room air requiring 4 L to maintain sats in the high 90s.  Troponin [id number] with BNP 1547.  Sodium 128, glucose 208.  Creatinine 1.66, up from baseline of 0.8 to a week prior with bicarb of 19.  WBC 12,900.  Hemoglobin 9.6, improved from postoperative anemia of 7.8 about 5 days ago.  COVID and flu negative and urinalysis unremarkable. EKG, personally viewed and interpreted showing NSR at 91 with J-point elevation V1 and V2, T wave inversion aVL. CTA chest negative for PE but showing bilateral pleural effusions and possible infectious process as outlined below: IMPRESSION: 1. No evidence of PE. 2. Large pleural effusions. 3. Patchy nodular alveolar process upper lungs concerning for an infectious process for which follow up is recommended to confirm resolution. 4. Bibasilar atelectasis/consolidation. 5. Hilar adenopathy likely to be reactive and follow up recommended. 6. Cirrhotic appearance of the liver.  The ED provider spoke with  cardiologist, Dr. Quentin Ore who suggest downward trend in troponin likely secondary to demand ischemia and will see in the AM.  Patient started on treatment for CHF with IV Lasix.  Also given azithromycin and ceftriaxone for possible pneumonia.  Hospitalist consulted for admission.   Review of Systems: As mentioned in the history of present illness. All other systems reviewed and are negative.  Past Medical History:  Diagnosis Date   Diabetes mellitus    Hematospermia    Hypertension    Nephrolithiasis 02/07/07   Viral meningitis    Past Surgical History:  Procedure Laterality Date   AMPUTATION Left 05/19/2022   Procedure: AMPUTATION BELOW KNEE;  Surgeon: Katha Cabal, MD;  Location: [facility];  Service: Vascular;  Laterality: Left;   CHOLECYSTECTOMY  12-07-14   laparoscopic, per Dr. Rush Farmer    LOWER EXTREMITY ANGIOGRAPHY Left 05/16/2022   Procedure: Lower Extremity Angiography;  Surgeon: Katha Cabal, MD;  Location: [place] [facility];  Service: Cardiovascular;  Laterality: Left;   Social History:  reports that he has quit smoking. He has never used smokeless tobacco. He reports that he does not drink alcohol and does not use drugs.  No Known Allergies  Family History  Problem Relation Age of Onset   Rheum arthritis Mother    Diabetes Father    Heart disease Father    Cholecystitis Sister    Cholecystitis Brother    Diabetes Brother    Diabetes Brother    Parkinson's disease Brother    Heart disease Sister    Diabetes Sister    Cholecystitis  Sister    Diabetes Sister    Arthritis Other    Hypertension Other    Stroke Other    Coronary artery disease Other    Birth defects Neg Hx     Prior to Admission medications   Medication Sig Start Date End Date Taking? Authorizing Provider  ACCU-CHEK GUIDE test strip Use to test blood sugars twice daily. 05/20/21   Laurey Morale, MD  Accu-Chek Softclix Lancets lancets daily. 12/31/19   [provider]   acetaminophen (TYLENOL) 500 MG tablet Take 500 mg by mouth in the morning and at bedtime.    [provider]  ALPRAZolam (XANAX) 0.5 MG tablet TAKE 1 TABLET (0.5 MG TOTAL) BY MOUTH 3 (THREE) TIMES DAILY AS NEEDED FOR ANXIETY OR SLEEP. 05/03/22   Laurey Morale, MD  amLODipine (NORVASC) 10 MG tablet Take 1 tablet (10 mg total) by mouth daily. 05/24/22   Max Sane, MD  aspirin EC 81 MG tablet Take 1 tablet (81 mg total) by mouth daily. Swallow whole. 05/24/22   Max Sane, MD  Blood Glucose Monitoring Suppl (ACCU-CHEK GUIDE ME) w/Device KIT USE AS DIRECTED EVERY DAY 12/31/19   [provider]  clopidogrel (PLAVIX) 75 MG tablet Take 1 tablet (75 mg total) by mouth daily. 05/24/22   Max Sane, MD  Continuous Blood Gluc Sensor ([place]) MISC See admin instructions. 05/02/22   [provider]  doxycycline (VIBRA-TABS) 100 MG tablet Take 1 tablet (100 mg total) by mouth 2 (two) times daily for 10 days. 05/23/22 06/02/22  Max Sane, MD  furosemide (LASIX) 20 MG tablet TAKE 1 TABLET (20 MG TOTAL) BY MOUTH DAILY AS NEEDED FOR FLUID. Patient taking differently: Take 20 mg by mouth daily. 05/03/22   Laurey Morale, MD  ibuprofen (ADVIL) 200 MG tablet Take 200 mg by mouth 2 (two) times daily.    [provider]  insulin isophane & regular human KwikPen (NOVOLIN 70/30 KWIKPEN) (70-30) 100 UNIT/ML KwikPen Inject 25 Units into the skin 2 (two) times daily with a meal. 02/03/22   Laurey Morale, MD  Insulin Pen Needle (BD PEN NEEDLE MICRO U/F) 32G X 6 MM MISC USE AS DIRECTED TO INJECT NOVILIN 05/08/22   Laurey Morale, MD  levothyroxine (SYNTHROID) 50 MCG tablet TAKE 1 TABLET BY MOUTH EVERY DAY 05/03/22   Laurey Morale, MD  metoprolol succinate (TOPROL-XL) 25 MG 24 hr tablet Take 1 tablet (25 mg total) by mouth daily. 08/23/21   Laurey Morale, MD  Multiple Vitamin (MULTIVITAMIN) tablet Take 1 tablet by mouth daily.    [provider]  SANTYL 250 UNIT/GM  ointment Apply 1 Application topically daily. Patient not taking: Reported on 05/25/2022 04/27/22   [provider]  temazepam (RESTORIL) 30 MG capsule TAKE 1 CAPSULE BY MOUTH AT BEDTIME AS NEEDED FOR SLEEP 03/20/22   Laurey Morale, MD    Physical Exam: Vitals:   05/27/22 2100 05/27/22 2130 05/27/22 2200 05/27/22 2230  BP: 132/68  130/67 127/69  Pulse: 99 93 98 93  Resp: (!) 25 20 (!) 28 (!) 21  Temp:      TempSrc:      SpO2: 96% 99% 98% 100%  Weight:      Height:       Physical Exam Vitals and nursing note reviewed.  Constitutional:      Comments: Patient sitting upright in bed, speaking in short sentences, tachypneic  HENT:     Head: Normocephalic and  atraumatic.  Cardiovascular:     Rate and Rhythm: Normal rate and regular rhythm.     Heart sounds: Normal heart sounds.  Pulmonary:     Effort: Tachypnea present.     Breath sounds: Rales present.     Comments: Decreased air movement at bases Abdominal:     Palpations: Abdomen is soft.     Tenderness: There is no abdominal tenderness.  Musculoskeletal:     Comments: Left BKA  Neurological:     Mental Status: Mental status is at baseline.     Labs on Admission: I have personally reviewed following labs and imaging studies  CBC: Recent Labs  Lab 05/21/22 0555 05/22/22 0500 05/27/22 1846  WBC 9.0 13.0* 12.9*  NEUTROABS  --   --  10.0*  HGB 7.2* 7.8* 9.6*  HCT 21.0* 23.4* 29.1*  MCV 92.1 95.5 95.4  PLT 202 221 191*   Basic Metabolic Panel: Recent Labs  Lab 05/21/22 0555 05/22/22 0500 05/27/22 1846  NA 136 136 128*  K 3.5 3.6 3.6  CL 111 109 99  CO2 18* 17* 19*  GLUCOSE 99 133* 208*  BUN 26* 31* 59*  CREATININE 1.16 1.45* 1.66*  CALCIUM 7.9* 7.8* 8.3*  MG 2.1  --   --    GFR: Estimated Creatinine Clearance: 52.7 mL/min (A) (by C-G formula based on SCr of 1.66 mg/dL (H)). Liver Function Tests: Recent Labs  Lab 05/27/22 1846  AST 23  ALT 24  ALKPHOS 95  BILITOT 0.8  PROT 7.5  ALBUMIN  2.8*   No results for input(s): "LIPASE", "AMYLASE" in the last 168 hours. No results for input(s): "AMMONIA" in the last 168 hours. Coagulation Profile: Recent Labs  Lab 05/27/22 1846  INR 1.1   Cardiac Enzymes: No results for input(s): "CKTOTAL", "CKMB", "CKMBINDEX", "TROPONINI" in the last 168 hours. BNP (last 3 results) No results for input(s): "PROBNP" in the last 8760 hours. HbA1C: No results for input(s): "HGBA1C" in the last 72 hours. CBG: Recent Labs  Lab 05/22/22 2027 05/22/22 2330 05/23/22 0510 05/23/22 0805 05/23/22 1231  GLUCAP 151* 109* 107* 127* 177*   Lipid Profile: No results for input(s): "CHOL", "HDL", "LDLCALC", "TRIG", "CHOLHDL", "LDLDIRECT" in the last 72 hours. Thyroid Function Tests: No results for input(s): "TSH", "T4TOTAL", "FREET4", "T3FREE", "THYROIDAB" in the last 72 hours. Anemia Panel: No results for input(s): "VITAMINB12", "FOLATE", "FERRITIN", "TIBC", "IRON", "RETICCTPCT" in the last 72 hours. Urine analysis:    Component Value Date/Time   COLORURINE YELLOW (A) 05/27/2022 2225   APPEARANCEUR CLEAR (A) 05/27/2022 2225   LABSPEC 1.019 05/27/2022 2225   PHURINE 5.0 05/27/2022 2225   GLUCOSEU NEGATIVE 05/27/2022 2225   HGBUR NEGATIVE 05/27/2022 2225   HGBUR negative 07/21/2008 0924   BILIRUBINUR NEGATIVE 05/27/2022 2225   BILIRUBINUR 1+ 11/12/2013 0945   KETONESUR NEGATIVE 05/27/2022 2225   PROTEINUR NEGATIVE 05/27/2022 2225   UROBILINOGEN 1.0 11/12/2013 0945   UROBILINOGEN 0.2 07/21/2008 0924   NITRITE NEGATIVE 05/27/2022 2225   LEUKOCYTESUR NEGATIVE 05/27/2022 2225    Radiological Exams on Admission: CT Angio Chest PE W and/or Wo Contrast  Result Date: 05/27/2022 CLINICAL DATA:  SOB EXAM: CT ANGIOGRAPHY CHEST WITH CONTRAST TECHNIQUE: Multidetector CT imaging of the chest was performed using the standard protocol during bolus administration of intravenous contrast. Multiplanar CT image reconstructions and MIPs were obtained to  evaluate the vascular anatomy. RADIATION DOSE REDUCTION: This exam was performed according to the departmental dose-optimization program which includes automated exposure control, adjustment of the mA  and/or kV according to patient size and/or use of iterative reconstruction technique. CONTRAST:  75 mL OMNIPAQUE IOHEXOL 350 MG/ML SOLN COMPARISON:  None Available. FINDINGS: Cardiovascular: Satisfactory opacification of the pulmonary arteries to the segmental level. No evidence of pulmonary embolism. Normal heart size. No pericardial effusion. Reflux of contrast into the IVC consistent with tricuspid insufficiency. No evidence of aortic aneurysm. Atheromatous changes noted of the aorta. Mediastinum/Nodes: 1.4 cm right hilar node. Few small mediastinal nodes. No mediastinal lesions identified. Lungs/Pleura: Large bilateral pleural effusions. Patchy alveolar process identified in the upper lobes likely an infectious process. Follow-up recommended to confirm resolution. Bibasilar, lingular and right middle lobe consolidation or atelectasis noted. Upper Abdomen: Nodular configuration liver suggests cirrhosis. Cholecystectomy clips. No adrenal lesions are identified. Musculoskeletal: Thoracic degenerative changes noted. Review of the MIP images confirms the above findings. IMPRESSION: 1. No evidence of PE. 2. Large pleural effusions. 3. Patchy nodular alveolar process upper lungs concerning for an infectious process for which follow up is recommended to confirm resolution. 4. Bibasilar atelectasis/consolidation. 5. Hilar adenopathy likely to be reactive and follow up recommended. 6. Cirrhotic appearance of the liver. Electronically Signed   By: Sammie Bench M.D.   On: 05/27/2022 20:04   DG Chest 2 View  Result Date: 05/27/2022 CLINICAL DATA:  Shortness of breath, postop hypoxia. EXAM: CHEST - 2 VIEW COMPARISON:  Chest radiograph dated FINDINGS: The heart is mildly enlarged. Pulmonary vascular congestion. Low lung  volumes. Bibasilar opacities suggesting bilateral pleural effusions and/or atelectasis or infiltrate. IMPRESSION: Mild cardiomegaly with pulmonary vascular congestion. Bibasilar opacities suggesting bilateral pleural effusions and/or atelectasis or infiltrate. Electronically Signed   By: Keane Police D.O.   On: 05/27/2022 19:21     Data Reviewed: Relevant notes from primary care and specialist visits, past discharge summaries as available in EHR, including Care Everywhere. Prior diagnostic testing as pertinent to current admission diagnoses Updated medications and problem lists for reconciliation ED course, including vitals, labs, imaging, treatment and response to treatment Triage notes, nursing and pharmacy notes and ED provider's notes Notable results as noted in HPI   Assessment and Plan: * Acute on chronic diastolic CHF (congestive heart failure) ([place]) Suspect secondary to fluid administration/overload in the recent perioperative from 12/1 to 12/5 History of grade 1 diastolic dysfunction on echocardiogram 2021 Continue IV Lasix Continue home metoprolol Daily weights with intake and output monitoring Echocardiogram in the a.m. Cardiology consult  Acute respiratory failure with hypoxia ([facility]CC) Multifactorial.  Mostly due to acute CHF with bilateral pleural effusions and with some concern for possible pneumonia CTA negative for PE Patient with increased work of breathing, O2 sats on room air of 87% requiring 3 to 4 L to maintain sats in the high 90s Treat etiologies as outlined separately Supplemental O2 to keep sats greater than or equal to 94%  Bilateral pleural effusion Secondary to acute CHF Expecting improvement with IV Lasix, however given finding of large pleural effusion on CT will consult IR for thoracentesis IR consulted Continue management of CHF above  Demand ischemia Troponin elevated but with non-ACS trend from 803 to 734 and patient denies chest pain Secondary to  acute CHF Follow echocardiogram to evaluate for focal wall motion abnormality Continue aspirin and metoprolol Cardiology has been consulted  Pneumonia CTA showing patchy nodular alveolar process upper lungs concerning for infectious process WBC elevated but no fever or chills  COVID and flu negative Patient received Rocephin and azithromycin in the ED Will get a procalcitonin Will continue antibiotics for now  AKI (acute kidney injury) ([place]) Metabolic acidosis Creatinine 1.66 above baseline is 0.82, with bicarb of 19 Suspect medication and contrast related.  Patient had IV contrast (angiogram) as well as IV vancomycin during his recent hospitalization from 12/1 to 12/5 Unable to hydrate due to CHF exacerbation Continue to monitor and avoid nephrotoxins  Uncontrolled type 2 diabetes mellitus with hyperglycemia, with [place]-term current use of insulin (HCC) Blood sugar 200 Continue basal insulin with sliding scale coverage  Anemia Hemoglobin 9.6 but improved from 7.8 on the day of discharge.  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 Inpatient status is judged to be reasonable and necessary in order to provide the required intensity of service to ensure the patient's safety. The patient's presenting symptoms, physical exam findings, and initial radiographic and laboratory data in the context of their chronic comorbidities is felt to place them at high risk for further clinical deterioration. Furthermore, it is not anticipated that the  patient will be medically stable for discharge from the hospital within 2 midnights of admission.   * I certify that at the point of admission it is my clinical judgment that the patient will require inpatient hospital care spanning beyond 2 midnights from the point of admission due to high intensity of service, high risk for further deterioration and high frequency of surveillance required.*  Author: Athena Masse, MD 05/27/2022 11:02 PM  For on call review [email or web address].

## 2022-05-27 NOTE — ED Provider Notes (Signed)
[facility] Provider Note   Event Date/Time   First MD Initiated Contact with Patient 05/27/22 1904     (approximate) History  Shortness of Breath  HPI CHRISOTPHER [name] is a 66 y.o. 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  Patient states that he has been having to sit straight up since he had the surgery and has had worsening dyspnea when trying to talk.  Patient denies any symptoms similar to this in the past.  Patient denies any lower extremity swelling.  Patient denies missing any doses of his normally prescribed medications. ROS: Patient currently denies any vision changes, tinnitus, difficulty speaking, facial droop, sore throat, chest pain, abdominal pain, nausea/vomiting/diarrhea, dysuria, or weakness/numbness/paresthesias in any extremity   Physical Exam  Triage Vital Signs: ED Triage Vitals  Enc Vitals Group     BP 05/27/22 1844 130/67     Pulse Rate 05/27/22 1844 97     Resp 05/27/22 1844 (!) 22     Temp 05/27/22 1849 97.9 F (36.6 C)     Temp Source 05/27/22 1849 Oral     SpO2 05/27/22 1844 (!) 87 %     Weight 05/27/22 1847 230 lb (104.3 kg)     Height 05/27/22 1847 5\' 8"  (1.727 m)     Head Circumference --      Peak Flow --      Pain Score 05/27/22 1847 0     Pain Loc --      Pain Edu? --      Excl. in GC? --    Most recent vital signs: Vitals:   05/27/22 2200 05/27/22 2230  BP: 130/67 127/69  Pulse: 98 93  Resp: (!) 28 (!) 21  Temp:    SpO2: 98% 100%   General: Awake, oriented x4. CV:  Good peripheral perfusion.  Resp:  Increased effort.  Rales over bilateral lung fields Abd:  No distention.  Other:  Elderly Caucasian male laying in bed in no acute distress.  Left BKA ED Results / Procedures / Treatments  Labs (all labs ordered are listed, but only abnormal  results are displayed) Labs Reviewed  COMPREHENSIVE METABOLIC PANEL - Abnormal; Notable for the following components:      Result Value   Sodium 128 (*)    CO2 19 (*)    Glucose, Bld 208 (*)    BUN 59 (*)    Creatinine, Ser 1.66 (*)    Calcium 8.3 (*)    Albumin 2.8 (*)    GFR, Estimated 46 (*)    All other components within normal limits  CBC WITH DIFFERENTIAL/PLATELET - Abnormal; Notable for the following components:   WBC 12.9 (*)    RBC 3.05 (*)    Hemoglobin 9.6 (*)    HCT 29.1 (*)    Platelets 429 (*)    Neutro Abs 10.0 (*)    Abs Immature Granulocytes 0.25 (*)    All other components within normal limits  URINALYSIS, ROUTINE W REFLEX MICROSCOPIC - Abnormal; Notable for the following components:   Color, Urine YELLOW (*)    APPearance CLEAR (*)    All other components within normal limits  BRAIN NATRIURETIC PEPTIDE - Abnormal; Notable for the following components:   B Natriuretic Peptide 1,547.0 (*)    All other components within normal  limits  TROPONIN I (HIGH SENSITIVITY) - Abnormal; Notable for the following components:   Troponin I (High Sensitivity) 803 (*)    All other components within normal limits  TROPONIN I (HIGH SENSITIVITY) - Abnormal; Notable for the following components:   Troponin I (High Sensitivity) 734 (*)    All other components within normal limits  RESP PANEL BY RT-PCR (RSV, FLU A&B, COVID)  RVPGX2  CULTURE, BLOOD (ROUTINE X 2)  CULTURE, BLOOD (ROUTINE X 2)  PROTIME-INR  LACTIC ACID, PLASMA  LACTIC ACID, PLASMA  PROCALCITONIN  BASIC METABOLIC PANEL  CBC   EKG ED ECG REPORT I, Naaman Plummer, the attending physician, personally viewed and interpreted this ECG. Date: 05/27/2022 EKG Time: 1900 Rate: 91 Rhythm: normal sinus rhythm QRS Axis: normal Intervals: normal ST/T Wave abnormalities: normal Narrative Interpretation: no evidence of acute ischemia RADIOLOGY ED MD interpretation: 2 view chest x-ray interpreted independently by me  shows mild cardiomegaly with pulmonary vascular congestion and bibasilar opacities suggesting bilateral pleural effusions -Agree with radiology assessment Official radiology report(s): CT Angio Chest PE W and/or Wo Contrast  Result Date: 05/27/2022 CLINICAL DATA:  SOB EXAM: CT ANGIOGRAPHY CHEST WITH CONTRAST TECHNIQUE: Multidetector CT imaging of the chest was performed using the standard protocol during bolus administration of intravenous contrast. Multiplanar CT image reconstructions and MIPs were obtained to evaluate the vascular anatomy. RADIATION DOSE REDUCTION: This exam was performed according to the departmental dose-optimization program which includes automated exposure control, adjustment of the mA and/or kV according to patient size and/or use of iterative reconstruction technique. CONTRAST:  75 mL OMNIPAQUE IOHEXOL 350 MG/ML SOLN COMPARISON:  None Available. FINDINGS: Cardiovascular: Satisfactory opacification of the pulmonary arteries to the segmental level. No evidence of pulmonary embolism. Normal heart size. No pericardial effusion. Reflux of contrast into the IVC consistent with tricuspid insufficiency. No evidence of aortic aneurysm. Atheromatous changes noted of the aorta. Mediastinum/Nodes: 1.4 cm right hilar node. Few small mediastinal nodes. No mediastinal lesions identified. Lungs/Pleura: Large bilateral pleural effusions. Patchy alveolar process identified in the upper lobes likely an infectious process. Follow-up recommended to confirm resolution. Bibasilar, lingular and right middle lobe consolidation or atelectasis noted. Upper Abdomen: Nodular configuration liver suggests cirrhosis. Cholecystectomy clips. No adrenal lesions are identified. Musculoskeletal: Thoracic degenerative changes noted. Review of the MIP images confirms the above findings. IMPRESSION: 1. No evidence of PE. 2. Large pleural effusions. 3. Patchy nodular alveolar process upper lungs concerning for an infectious  process for which follow up is recommended to confirm resolution. 4. Bibasilar atelectasis/consolidation. 5. Hilar adenopathy likely to be reactive and follow up recommended. 6. Cirrhotic appearance of the liver. Electronically Signed   By: Sammie Bench M.D.   On: 05/27/2022 20:04   DG Chest 2 View  Result Date: 05/27/2022 CLINICAL DATA:  Shortness of breath, postop hypoxia. EXAM: CHEST - 2 VIEW COMPARISON:  Chest radiograph dated FINDINGS: The heart is mildly enlarged. Pulmonary vascular congestion. Low lung volumes. Bibasilar opacities suggesting bilateral pleural effusions and/or atelectasis or infiltrate. IMPRESSION: Mild cardiomegaly with pulmonary vascular congestion. Bibasilar opacities suggesting bilateral pleural effusions and/or atelectasis or infiltrate. Electronically Signed   By: Keane Police D.O.   On: 05/27/2022 19:21   PROCEDURES: Critical Care performed: Yes, see critical care procedure note(s) .1-3 Lead EKG Interpretation  Performed by: Naaman Plummer, MD Authorized by: Naaman Plummer, MD     Interpretation: normal     ECG rate:  94   ECG rate assessment: normal     Rhythm:  sinus rhythm     Ectopy: none     Conduction: normal   CRITICAL CARE Performed by: Naaman Plummer  Total critical care time: 33 minutes  Critical care time was exclusive of separately billable procedures and treating other patients.  Critical care was necessary to treat or prevent imminent or life-threatening deterioration.  Critical care was time spent personally by me on the following activities: development of treatment plan with patient and/or surrogate as well as nursing, discussions with consultants, evaluation of patient's response to treatment, examination of patient, obtaining history from patient or surrogate, ordering and performing treatments and interventions, ordering and review of laboratory studies, ordering and review of radiographic studies, pulse oximetry and re-evaluation of  patient's condition.  MEDICATIONS ORDERED IN ED: Medications  aspirin EC tablet 81 mg (has no administration in time range)  metoprolol succinate (TOPROL-XL) 24 hr tablet 25 mg (has no administration in time range)  temazepam (RESTORIL) capsule 15 mg (has no administration in time range)  levothyroxine (SYNTHROID) tablet 50 mcg (has no administration in time range)  clopidogrel (PLAVIX) tablet 75 mg (has no administration in time range)  acetaminophen (TYLENOL) tablet 650 mg (has no administration in time range)    Or  acetaminophen (TYLENOL) suppository 650 mg (has no administration in time range)  ondansetron (ZOFRAN) tablet 4 mg (has no administration in time range)    Or  ondansetron (ZOFRAN) injection 4 mg (has no administration in time range)  cefTRIAXone (ROCEPHIN) 2 g in sodium chloride 0.9 % 100 mL IVPB (has no administration in time range)  azithromycin (ZITHROMAX) 500 mg in sodium chloride 0.9 % 250 mL IVPB (has no administration in time range)  furosemide (LASIX) injection 40 mg (has no administration in time range)  insulin glargine-yfgn (SEMGLEE) injection 5 Units (has no administration in time range)  morphine (PF) 2 MG/ML injection 2 mg (has no administration in time range)  HYDROcodone-acetaminophen (NORCO/VICODIN) 5-325 MG per tablet 1-2 tablet (has no administration in time range)  albuterol (PROVENTIL) (2.5 MG/3ML) 0.083% nebulizer solution 2.5 mg (has no administration in time range)  insulin aspart (novoLOG) injection 0-15 Units (has no administration in time range)  cefTRIAXone (ROCEPHIN) 1 g in sodium chloride 0.9 % 100 mL IVPB (has no administration in time range)  iohexol (OMNIPAQUE) 350 MG/ML injection 75 mL (75 mLs Intravenous Contrast Given 05/27/22 1951)  cefTRIAXone (ROCEPHIN) 1 g in sodium chloride 0.9 % 100 mL IVPB (0 g Intravenous Stopped 05/27/22 2239)  azithromycin (ZITHROMAX) 500 mg in sodium chloride 0.9 % 250 mL IVPB (0 mg Intravenous Stopped 05/28/22  0003)  furosemide (LASIX) injection 40 mg (40 mg Intravenous Given 05/27/22 2142)   IMPRESSION / MDM / ASSESSMENT AND PLAN / ED COURSE  I reviewed the triage vital signs and the nursing notes.                             The patient is on the cardiac monitor to evaluate for evidence of arrhythmia and/or significant heart rate changes. Patient's presentation is most consistent with acute presentation with potential threat to life or bodily function. Endorses dyspnea, denies LE edema Denies Non adherence to medication regimen  Workup: ECG, CBC, BMP, Troponin, BNP, CXR Findings: EKG: No STEMI and no evidence of Brugadas sign, delta wave, epsilon wave, significantly prolonged QTc, or malignant arrhythmia. BNP: 1547 CXR: Pulmonary edema with bilateral pleural effusions and cardiomegaly Based on history, exam and findings, presentation most  consistent with acute on chronic heart failure. Low suspicion for PNA, ACS, tamponade, aortic dissection. Interventions: Oxygen, Diuresis  Reassessment: Symptoms improved in ED with oxygen and diuresis  Disposition (Stable but not significantly improved): Admit to medicine for further monitoring and for improvement of medication regimen to control symptoms.    FINAL CLINICAL IMPRESSION(S) / ED DIAGNOSES   Final diagnoses:  Acute pulmonary edema (HCC)  Pleural effusion, bilateral  Acute respiratory failure with hypoxia ([place])   Rx / DC Orders   ED Discharge Orders     None      Note:  This document was prepared using Dragon voice recognition software and may include unintentional dictation errors.   Naaman Plummer, MD 05/28/22 0010

## 2022-05-27 NOTE — ED Notes (Signed)
Pt placed on 2L [place]

## 2022-05-27 NOTE — ED Notes (Signed)
Dr. Vicente Males aware of Troponin [id number]

## 2022-05-27 NOTE — ED Notes (Signed)
Pt O2 increased to 4L

## 2022-05-27 NOTE — Assessment & Plan Note (Signed)
Patient is managed on insulin 70/30 25 units BID as an outpatient. Most recent hemoglobin A1C of 6.7% 7 days prior which suggests well controlled diabetes. Patient started on Semglee 5 units daily and SSI on admission. -Switch to Levemir 10 units BID and continue SSI while inpatient

## 2022-05-27 NOTE — ED Notes (Signed)
Patient given two urinal per request. Wife is at bedside to help with using the urinals. Patient given a warm blanket per request.

## 2022-05-27 NOTE — Assessment & Plan Note (Signed)
Present on admission and improved from most recent prior to admission hemoglobin of 7.8 at 9.6 on admission. Drifted down slightly to 9. No evidence of acute bleeding at this time. Presumed secondary to perioperative blood loss from previous admission.

## 2022-05-27 NOTE — Assessment & Plan Note (Signed)
CTA showing patchy nodular alveolar process upper lungs concerning for infectious process WBC elevated but no fever or chills  COVID and flu negative Patient received Rocephin and azithromycin in the ED Will get a procalcitonin Will continue antibiotics for now

## 2022-05-27 NOTE — ED Notes (Signed)
Patient taken to CT scan.

## 2022-05-27 NOTE — Assessment & Plan Note (Addendum)
Metabolic acidosis Creatinine 1.66 above baseline is 0.82, with bicarb of 19 Suspect medication and contrast related.  Patient had IV contrast (angiogram) as well as IV vancomycin during his recent hospitalization from 12/1 to 12/5 Unable to hydrate due to CHF exacerbation Continue to monitor and avoid nephrotoxins

## 2022-05-27 NOTE — ED Provider Triage Note (Signed)
Emergency Medicine Provider Triage Evaluation Note  [name] , a 66 y.o. male  was evaluated in triage.  Pt complains of shortness of breath following surgery.  Patient had a below the knee amputation for osteomyelitis.  He has been experiencing some increasing shortness of breath.  Patient arrives with an O2 saturation of 87% on room air.  No associated chest pain.  No bleeding or clotting disorders..  Review of Systems  Positive: Shortness of breath Negative: Fever, chills, cough  Physical Exam  BP 130/67 (BP Location: Left Arm)   Pulse 97   Temp 97.9 F (36.6 C) (Oral)   Resp (!) 22   Ht 5\' 8"  (1.727 m)   Wt 104.3 kg   SpO2 (!) 87%   BMI 34.97 kg/m  Gen:   Awake, increased respiratory effort Resp:  Slightly increased respiratory effort.  No significant wheezing. MSK:   Moves extremities without difficulty  Other:    Medical Decision Making  Medically screening exam initiated at 6:50 PM.  Appropriate orders placed.  [name] was informed that the remainder of the evaluation will be completed by another provider, this initial triage assessment does not replace that evaluation, and the importance of remaining in the ED until their evaluation is complete.  Patient arrives with shortness of breath, chest tightness after having a below the knee amputation performed secondary to osteomyelitis.  Patient has shortness of breath without chest pain.  Hypoxic in triage.  At this time patient will have labs, chest x-ray, EKG and CT scan ordered.   Piedad Climes, PA-C 05/27/22 1900

## 2022-05-27 NOTE — Assessment & Plan Note (Signed)
Troponin elevated but with non-ACS trend from 803 to 734 and patient denies chest pain Secondary to acute CHF Follow echocardiogram to evaluate for focal wall motion abnormality Continue aspirin and metoprolol Cardiology has been consulted

## 2022-05-27 NOTE — Assessment & Plan Note (Signed)
Patient is status post BKA 05/19/2022 Assistance with transfers to prevent falls Wound care

## 2022-05-27 NOTE — Assessment & Plan Note (Signed)
Multifactorial.  Mostly due to acute CHF with bilateral pleural effusions and with some concern for possible pneumonia CTA negative for PE Patient with increased work of breathing, O2 sats on room air of 87% requiring 3 to 4 L to maintain sats in the high 90s Etiologies as outlined Supplemental O2 to keep sats greater than or equal to 94%

## 2022-05-27 NOTE — Assessment & Plan Note (Signed)
Secondary to acute CHF Expecting improvement with IV Lasix, however given finding of large pleural effusion on CT will consult IR for thoracentesis IR consulted Continue management of CHF above

## 2022-05-28 DIAGNOSIS — Z89512 Acquired absence of left leg below knee: Secondary | ICD-10-CM

## 2022-05-28 DIAGNOSIS — N179 Acute kidney failure, unspecified: Secondary | ICD-10-CM | POA: Diagnosis not present

## 2022-05-28 DIAGNOSIS — R778 Other specified abnormalities of plasma proteins: Secondary | ICD-10-CM

## 2022-05-28 DIAGNOSIS — Z794 Long term (current) use of insulin: Secondary | ICD-10-CM

## 2022-05-28 DIAGNOSIS — I5033 Acute on chronic diastolic (congestive) heart failure: Secondary | ICD-10-CM | POA: Diagnosis not present

## 2022-05-28 DIAGNOSIS — E1165 Type 2 diabetes mellitus with hyperglycemia: Secondary | ICD-10-CM

## 2022-05-28 DIAGNOSIS — E039 Hypothyroidism, unspecified: Secondary | ICD-10-CM

## 2022-05-28 DIAGNOSIS — E872 Acidosis, unspecified: Secondary | ICD-10-CM

## 2022-05-28 DIAGNOSIS — J9601 Acute respiratory failure with hypoxia: Secondary | ICD-10-CM | POA: Diagnosis not present

## 2022-05-28 DIAGNOSIS — J90 Pleural effusion, not elsewhere classified: Secondary | ICD-10-CM | POA: Diagnosis not present

## 2022-05-28 LAB — BLOOD GAS, ARTERIAL
Acid-base deficit: 5.4 mmol/L — ABNORMAL HIGH (ref 0.0–2.0)
Bicarbonate: 18.6 mmol/L — ABNORMAL LOW (ref 20.0–28.0)
O2 Content: 6 L/min
O2 Saturation: 98.3 %
Patient temperature: 37
pCO2 arterial: 30 mmHg — ABNORMAL LOW (ref 32–48)
pH, Arterial: 7.4 (ref 7.35–7.45)
pO2, Arterial: 89 mmHg (ref 83–108)

## 2022-05-28 LAB — CBC
HCT: 27.5 % — ABNORMAL LOW (ref 39.0–52.0)
Hemoglobin: 9 g/dL — ABNORMAL LOW (ref 13.0–17.0)
MCH: 31.7 pg (ref 26.0–34.0)
MCHC: 32.7 g/dL (ref 30.0–36.0)
MCV: 96.8 fL (ref 80.0–100.0)
Platelets: 352 10*3/uL (ref 150–400)
RBC: 2.84 MIL/uL — ABNORMAL LOW (ref 4.22–5.81)
RDW: 14.2 % (ref 11.5–15.5)
WBC: 13 10*3/uL — ABNORMAL HIGH (ref 4.0–10.5)
nRBC: 0 % (ref 0.0–0.2)

## 2022-05-28 LAB — BASIC METABOLIC PANEL
Anion gap: 8 (ref 5–15)
BUN: 54 mg/dL — ABNORMAL HIGH (ref 8–23)
CO2: 20 mmol/L — ABNORMAL LOW (ref 22–32)
Calcium: 8 mg/dL — ABNORMAL LOW (ref 8.9–10.3)
Chloride: 102 mmol/L (ref 98–111)
Creatinine, Ser: 1.55 mg/dL — ABNORMAL HIGH (ref 0.61–1.24)
GFR, Estimated: 50 mL/min — ABNORMAL LOW (ref >60)
Glucose, Bld: 223 mg/dL — ABNORMAL HIGH (ref 70–99)
Potassium: 3.9 mmol/L (ref 3.5–5.1)
Sodium: 130 mmol/L — ABNORMAL LOW (ref 135–145)

## 2022-05-28 LAB — CBG MONITORING, ED
Glucose-Capillary: 186 mg/dL — ABNORMAL HIGH (ref 70–99)
Glucose-Capillary: 216 mg/dL — ABNORMAL HIGH (ref 70–99)
Glucose-Capillary: 184 mg/dL — ABNORMAL HIGH (ref 70–99)
Glucose-Capillary: 136 mg/dL — ABNORMAL HIGH (ref 70–99)
Glucose-Capillary: 154 mg/dL — ABNORMAL HIGH (ref 70–99)
Glucose-Capillary: 142 mg/dL — ABNORMAL HIGH (ref 70–99)
Glucose-Capillary: 144 mg/dL — ABNORMAL HIGH (ref 70–99)

## 2022-05-28 LAB — LACTIC ACID, PLASMA: Lactic Acid, Venous: 1.6 mmol/L (ref 0.5–1.9)

## 2022-05-28 LAB — LACTATE DEHYDROGENASE: LDH: 189 U/L (ref 98–192)

## 2022-05-28 LAB — PROCALCITONIN: Procalcitonin: <0.1 ng/mL

## 2022-05-28 LAB — PROTEIN, TOTAL: Total Protein: 7.1 g/dL (ref 6.5–8.1)

## 2022-05-28 MED ORDER — ALPRAZOLAM 0.5 MG PO TABS
0.5000 mg | ORAL_TABLET | Freq: Three times a day (TID) | ORAL | Status: DC | PRN
  Administered 2022-05-28 – 2022-06-03 (×8): 0.5 mg via ORAL
  Filled 2022-05-28 (×8): qty 1

## 2022-05-28 MED ORDER — FUROSEMIDE 10 MG/ML IJ SOLN
80.0000 mg | Freq: Two times a day (BID) | INTRAMUSCULAR | Status: DC
  Administered 2022-05-28 – 2022-05-31 (×6): 80 mg via INTRAVENOUS
  Filled 2022-05-28 (×6): qty 8

## 2022-05-28 MED ORDER — INSULIN DETEMIR 100 UNIT/ML ~~LOC~~ SOLN
10.0000 [IU] | Freq: Two times a day (BID) | SUBCUTANEOUS | Status: DC
  Administered 2022-05-28 – 2022-05-29 (×3): 10 [IU] via SUBCUTANEOUS
  Filled 2022-05-28 (×4): qty 0.1

## 2022-05-28 MED ORDER — ALUM & MAG HYDROXIDE-SIMETH 200-200-20 MG/5ML PO SUSP
30.0000 mL | ORAL | Status: DC | PRN
  Administered 2022-06-02: 30 mL via ORAL
  Filled 2022-05-28: qty 30

## 2022-05-28 NOTE — Hospital Course (Signed)
MITHCELL SCHUMPERT is a 66 y.o. male with a history of hypertension, diabetes mellitus type 2, neuropathy, PAD, hypothyroidism gangrene/osteomyelitis s/p recent left BKA. Patient presented secondary to shortness of breath and found to have evidence of acute heart failure with bilateral pleural effusions and acute respiratory failure. Supplementation oxygen provided and Lasix IV started. Cardiology consulted.

## 2022-05-28 NOTE — Assessment & Plan Note (Signed)
Secondary to AKI. Mild. Resolved.

## 2022-05-28 NOTE — Progress Notes (Addendum)
PROGRESS NOTE    [name]  OIN:[id number] DOB: June 26, 1955 DOA: 05/27/2022 PCP: Nelwyn Salisbury, MD   Brief Narrative: [name] is a 67 y.o. male with a history of hypertension, diabetes mellitus type 2, neuropathy, PAD, hypothyroidism gangrene/osteomyelitis s/p recent left BKA. Patient presented secondary to shortness of breath and found to have evidence of acute heart failure with bilateral pleural effusions and acute respiratory failure. Supplementation oxygen provided and Lasix IV started. Cardiology consulted.   Assessment and Plan: * Acute on chronic diastolic CHF (congestive heart failure) (HCC) Possibly related to recent admission and IV fluid administration. Patient with associated large pleural effusions with vascular congestion. BNP of 1,547. Patient started on Lasix IV and cardiology consulted. -Continue Lasix IV -Strict in/out and daily weights (standing); Obtain weight now and use as admission weight. -Follow-up Transthoracic Echocardiogram (ordered)  Acute respiratory failure with hypoxia (HCC) Appears to be multifactorial. Patient with acute heart failure, pleural effusions and possible pneumonia. CT angio of the chest ruled out acute/chronic PE. Patient requiring 4 L/min of supplemental oxygen on admission and has increased to a peak of 6 L/min. -Wean oxygen to room air; goal SpO2 >92% -Daily ambulatory pulse ox  Bilateral pleural effusion Acute. Likely secondary to acute heart failure. Associated respiratory failure. Lasix diuresis started and thoracentesis ordered on admission. -Follow-up IR for thoracentesis; labs added to order  Demand ischemia Possible diagnosis. Patient without chest pain or acute ST-T wave changes. Troponin elevated to 803 with delta down to 734. Likely secondary to acute heart failure.  CAP (community acquired pneumonia) CT angio of the chest significant for patchy upper lung infiltrates consistent with possible infectious process.  COVID-19 negative. WBC of 12.9 on admission which is stable from prior admission at discharge. Procalcitonin is undetectable. Blood cultures obtained. Patient started on Ceftriaxone and azithromycin empirically. -Continue Ceftriaxone and azithromycin -Follow-up pleural fluid culture data -Follow-up blood cultures  AKI (acute kidney injury) (HCC) Baseline creatinine is about 1. Creatinine of 1.66 on admission which is slightly elevated from creatinine of 1.45 from recent hospital discharge. Likely secondary to fluid overload although patient received Vancomycin and IV contrast at recent hospitalization; IV contrast received this admission for CTA. Lasix IV started. Creatinine down to 1.55 today. -BMP in AM  Metabolic acidosis Secondary to AKI. Mild. CO2 improved today.  Controlled type 2 diabetes mellitus with hyperglycemia, with Ghazarian-term current use of insulin (HCC) Patient is managed on insulin 70/30 25 units BID as an outpatient. Most recent hemoglobin A1C of 6.7% 7 days prior which suggests well controlled diabetes. Patient started on Semglee 5 units daily and SSI on admission. -Switch to Levemir 10 units BID and continue SSI while inpatient  Anemia Present on admission and improved from most recent prior to admission hemoglobin of 7.8 at 9.6 on admission. Drifted down slightly to 9 this morning. No evidence of acute bleeding at this time. Presumed secondary to perioperative blood loss from previous admission.  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 Recommendation during prior admission for inpatient rehabilitation, which the patient declined. -PT/OT during this admission  Hypothyroidism -Continue Synthroid 25 mg daily    DVT prophylaxis: SCDs Code Status:   Code Status: Full Code Family Communication: None at bedside Disposition Plan: Discharge pending improvement in fluid status and ability to wean off oxygen   Consultants:   Cardiology  Procedures:  None  Antimicrobials: Ceftriaxone Azithromycin    Subjective: Patient reports improvement of dyspnea. No chest pain.  Objective:  BP (!) 110/57 (BP Location: Left Arm)   Pulse 91   Temp 97.8 F (36.6 C) (Oral)   Resp 20   Ht 5\' 8"  (1.727 m)   Wt 104.3 kg   SpO2 100%   BMI 34.97 kg/m   Examination:  General exam: Appears calm and comfortable Respiratory system: Clear to auscultation but significantly diminished in bilateral bases. Respiratory effort normal. Cardiovascular system: S1 & S2 heard, RRR. Gastrointestinal system: Abdomen is nondistended, soft and nontender. Normal bowel sounds heard. Central nervous system: Alert and oriented. No focal neurological deficits. Musculoskeletal: 2+ pitting edema; left BKA. No calf tenderness Skin: No cyanosis. No rashes Psychiatry: Judgement and insight appear normal. Mood & affect appropriate.    Data Reviewed: I have personally reviewed following labs and imaging studies  CBC Lab Results  Component Value Date   WBC 13.0 (H) 05/28/2022   RBC 2.84 (L) 05/28/2022   HGB 9.0 (L) 05/28/2022   HCT 27.5 (L) 05/28/2022   MCV 96.8 05/28/2022   MCH 31.7 05/28/2022   PLT 352 05/28/2022   MCHC 32.7 05/28/2022   RDW 14.2 05/28/2022   LYMPHSABS 1.6 05/27/2022   MONOABS 0.8 05/27/2022   EOSABS 0.2 05/27/2022   BASOSABS 0.1 05/27/2022     Last metabolic panel Lab Results  Component Value Date   NA 130 (L) 05/28/2022   K 3.9 05/28/2022   CL 102 05/28/2022   CO2 20 (L) 05/28/2022   BUN 54 (H) 05/28/2022   CREATININE 1.55 (H) 05/28/2022   GLUCOSE 223 (H) 05/28/2022   GFRNONAA 50 (L) 05/28/2022   GFRAA >60 01/10/2020   CALCIUM 8.0 (L) 05/28/2022   PHOS 2.7 05/18/2022   PROT 7.5 05/27/2022   ALBUMIN 2.8 (L) 05/27/2022   BILITOT 0.8 05/27/2022   ALKPHOS 95 05/27/2022   AST 23 05/27/2022   ALT 24 05/27/2022   ANIONGAP 8 05/28/2022    GFR: Estimated Creatinine Clearance: 56.4 mL/min (A) (by  C-G formula based on SCr of 1.55 mg/dL (H)).  Recent Results (from the past 240 hour(s))  Resp panel by RT-PCR (RSV, Flu A&B, Covid) Anterior Nasal Swab     Status: None   Collection Time: 05/27/22  6:47 PM   Specimen: Anterior Nasal Swab  Result Value Ref Range Status   SARS Coronavirus 2 by RT PCR NEGATIVE NEGATIVE Final    Comment: (NOTE) SARS-CoV-2 target nucleic acids are NOT DETECTED.  The SARS-CoV-2 RNA is generally detectable in upper respiratory specimens during the acute phase of infection. The lowest concentration of SARS-CoV-2 viral copies this assay can detect is 138 copies/mL. A negative result does not preclude SARS-Cov-2 infection and should not be used as the sole basis for treatment or other patient management decisions. A negative result may occur with  improper specimen collection/handling, submission of specimen other than nasopharyngeal swab, presence of viral mutation(s) within the areas targeted by this assay, and inadequate number of viral copies(<138 copies/mL). A negative result must be combined with clinical observations, patient history, and epidemiological information. The expected result is Negative.  Fact Sheet for Patients:  BloggerCourse.comhttps://www.fda.gov/media/152166/download  Fact Sheet for Healthcare Providers:  SeriousBroker.ithttps://www.fda.gov/media/152162/download  This test is no t yet approved or cleared by the [place] FDA and  has been authorized for detection and/or diagnosis of SARS-CoV-2 by FDA under an Emergency Use Authorization (EUA). This EUA will remain  in effect (meaning this test can be used) for the duration of the COVID-19 declaration under Section 564(b)(1) of the Act, 21 U.S.C.section 360bbb-3(b)(1),  unless the authorization is terminated  or revoked sooner.       Influenza A by PCR NEGATIVE NEGATIVE Final   Influenza B by PCR NEGATIVE NEGATIVE Final    Comment: (NOTE) The Xpert Xpress SARS-CoV-2/FLU/RSV plus assay is intended as an  aid in the diagnosis of influenza from Nasopharyngeal swab specimens and should not be used as a sole basis for treatment. Nasal washings and aspirates are unacceptable for Xpert Xpress SARS-CoV-2/FLU/RSV testing.  Fact Sheet for Patients: BloggerCourse.com  Fact Sheet for Healthcare Providers: SeriousBroker.it  This test is not yet approved or cleared by the [place] FDA and has been authorized for detection and/or diagnosis of SARS-CoV-2 by FDA under an Emergency Use Authorization (EUA). This EUA will remain in effect (meaning this test can be used) for the duration of the COVID-19 declaration under Section 564(b)(1) of the Act, 21 U.S.C. section 360bbb-3(b)(1), unless the authorization is terminated or revoked.     Resp Syncytial Virus by PCR NEGATIVE NEGATIVE Final    Comment: (NOTE) Fact Sheet for Patients: BloggerCourse.com  Fact Sheet for Healthcare Providers: SeriousBroker.it  This test is not yet approved or cleared by the [place] FDA and has been authorized for detection and/or diagnosis of SARS-CoV-2 by FDA under an Emergency Use Authorization (EUA). This EUA will remain in effect (meaning this test can be used) for the duration of the COVID-19 declaration under Section 564(b)(1) of the Act, 21 U.S.C. section 360bbb-3(b)(1), unless the authorization is terminated or revoked.  Performed at [facility], [address] [place], [place] [address]       Radiology Studies: CT Angio Chest PE W and/or Wo Contrast  Result Date: 05/27/2022 CLINICAL DATA:  SOB EXAM: CT ANGIOGRAPHY CHEST WITH CONTRAST TECHNIQUE: Multidetector CT imaging of the chest was performed using the standard protocol during bolus administration of intravenous contrast. Multiplanar CT image reconstructions and MIPs were obtained to evaluate the vascular anatomy. RADIATION DOSE  REDUCTION: This exam was performed according to the departmental dose-optimization program which includes automated exposure control, adjustment of the mA and/or kV according to patient size and/or use of iterative reconstruction technique. CONTRAST:  75 mL OMNIPAQUE IOHEXOL 350 MG/ML SOLN COMPARISON:  None Available. FINDINGS: Cardiovascular: Satisfactory opacification of the pulmonary arteries to the segmental level. No evidence of pulmonary embolism. Normal heart size. No pericardial effusion. Reflux of contrast into the IVC consistent with tricuspid insufficiency. No evidence of aortic aneurysm. Atheromatous changes noted of the aorta. Mediastinum/Nodes: 1.4 cm right hilar node. Few small mediastinal nodes. No mediastinal lesions identified. Lungs/Pleura: Large bilateral pleural effusions. Patchy alveolar process identified in the upper lobes likely an infectious process. Follow-up recommended to confirm resolution. Bibasilar, lingular and right middle lobe consolidation or atelectasis noted. Upper Abdomen: Nodular configuration liver suggests cirrhosis. Cholecystectomy clips. No adrenal lesions are identified. Musculoskeletal: Thoracic degenerative changes noted. Review of the MIP images confirms the above findings. IMPRESSION: 1. No evidence of PE. 2. Large pleural effusions. 3. Patchy nodular alveolar process upper lungs concerning for an infectious process for which follow up is recommended to confirm resolution. 4. Bibasilar atelectasis/consolidation. 5. Hilar adenopathy likely to be reactive and follow up recommended. 6. Cirrhotic appearance of the liver. Electronically Signed   By: Layla Maw M.D.   On: 05/27/2022 20:04   DG Chest 2 View  Result Date: 05/27/2022 CLINICAL DATA:  Shortness of breath, postop hypoxia. EXAM: CHEST - 2 VIEW COMPARISON:  Chest radiograph dated FINDINGS: The heart is mildly enlarged. Pulmonary vascular congestion.  Low lung volumes. Bibasilar opacities suggesting  bilateral pleural effusions and/or atelectasis or infiltrate. IMPRESSION: Mild cardiomegaly with pulmonary vascular congestion. Bibasilar opacities suggesting bilateral pleural effusions and/or atelectasis or infiltrate. Electronically Signed   By: Larose Hires D.O.   On: 05/27/2022 19:21      LOS: 1 day    Jacquelin Hawking, MD [facility] 05/28/2022, 8:00 AM   If 7PM-7AM, please contact night-coverage [email or web address]

## 2022-05-28 NOTE — Progress Notes (Signed)
PT Cancellation Note  Patient Details Name: [name] MRN: [id number] DOB: 01-15-1956   Cancelled Treatment:    Reason Eval/Treat Not Completed: Other (comment) Pt expressed he had already transferred today and was too tired to participate in therapy. He requested PT to come back at a later date and was worried mobility would aggravate his symptoms. Pt educated on mobility and maintained refusal request. Pt will continue as able.   Apolinar Bero O'Daniel, SPT  05/28/2022, 2:16 PM

## 2022-05-28 NOTE — ED Notes (Signed)
Pts family notified that pt has received a diet order and will be eating.

## 2022-05-28 NOTE — Assessment & Plan Note (Signed)
-  Continue Synthroid 25 mg daily

## 2022-05-28 NOTE — Consult Note (Addendum)
Cardiology Consultation   Patient ID: [name] MRN: [id number]; DOB: 12-23-1955  Admit date: 05/27/2022 Date of Consult: 05/28/2022  PCP:  Nelwyn Salisbury, MD   [facility] Providers Cardiologist:  None   {   Patient Profile:   [name] is a 66 y.o. male with a hx of hypertension, diabetes, peripheral vascular disease, hypothyroidism and a recent left below the knee amputation secondary to gangrene who is being seen 05/28/2022 for the evaluation of new onset congestive heart failure symptoms at the request of Dr. Caleb Popp.  History of Present Illness:   [name] had his left BKA on December 1 and was discharged December 5.  Immediately after the surgery he did not feel short of breath but within days of the procedure he began developing shortness of breath, orthopnea and PND.  He describes a chest pressure but no chest pain.  The symptoms progressively worsened until he presented to the emergency department for evaluation.  In the ER he was found to be significantly volume overloaded.  He received IV diuresis and his symptoms have improved somewhat since his arrival.   Past Medical History:  Diagnosis Date   Diabetes mellitus    Hematospermia    Hypertension    Nephrolithiasis 02/07/07   Viral meningitis     Past Surgical History:  Procedure Laterality Date   AMPUTATION Left 05/19/2022   Procedure: AMPUTATION BELOW KNEE;  Surgeon: Renford Dills, MD;  Location: [facility] ORS;  Service: Vascular;  Laterality: Left;   CHOLECYSTECTOMY  12-07-14   laparoscopic, per Dr. Rayburn Ma    LOWER EXTREMITY ANGIOGRAPHY Left 05/16/2022   Procedure: Lower Extremity Angiography;  Surgeon: Renford Dills, MD;  Location: [facility] INVASIVE CV LAB;  Service: Cardiovascular;  Laterality: Left;       Inpatient Medications: Scheduled Meds:  aspirin EC  81 mg Oral Daily   clopidogrel  75 mg Oral Daily   furosemide  80 mg Intravenous BID   insulin aspart  0-15 Units Subcutaneous Q4H    insulin detemir  10 Units Subcutaneous BID   levothyroxine  50 mcg Oral Q0600   metoprolol succinate  25 mg Oral Daily   Continuous Infusions:  azithromycin     cefTRIAXone (ROCEPHIN)  IV     PRN Meds: acetaminophen **OR** acetaminophen, albuterol, ALPRAZolam, alum & mag hydroxide-simeth, HYDROcodone-acetaminophen, morphine injection, ondansetron **OR** ondansetron (ZOFRAN) IV  Allergies:   No Known Allergies  Social History:   Social History   Socioeconomic History   Marital status: Married    Spouse name: Not on file   Number of children: 2   Years of education: Not on file   Highest education level: Not on file  Occupational History   Not on file  Tobacco Use   Smoking status: Former   Smokeless tobacco: Never   Tobacco comments:    22 yrs ago   Substance and Sexual Activity   Alcohol use: No    Alcohol/week: 0.0 standard drinks of alcohol   Drug use: No   Sexual activity: Not on file  Other Topics Concern   Not on file  Social History Narrative   Right Handed   Drinks little caffeine - only diet sodas if so   One Story Home    5 Grandchildren   Social Determinants of Health   Financial Resource Strain: Unknown (01/01/2022)   Overall Financial Resource Strain (CARDIA)    Difficulty of Paying Living Expenses: Patient refused  Food Insecurity: Unknown (  05/16/2022)   Hunger Vital Sign    Worried About Running Out of Food in the Last Year: Patient refused    Ran Out of Food in the Last Year: Patient refused  Transportation Needs: Unknown (05/16/2022)   PRAPARE - Transportation    Lack of Transportation (Medical): Patient refused    Lack of Transportation (Non-Medical): Patient refused  Physical Activity: Unknown (01/01/2022)   Exercise Vital Sign    Days of Exercise per Week: Patient refused    Minutes of Exercise per Session: Not on file  Stress: Unknown (01/01/2022)   [employer] of Occupational Health - Occupational Stress Questionnaire    Feeling  of Stress : Patient refused  Social Connections: Unknown (01/01/2022)   Social Connection and Isolation Panel [NHANES]    Frequency of Communication with Friends and Family: Patient refused    Frequency of Social Gatherings with Friends and Family: Patient refused    Attends Religious Services: Patient refused    Active Member of Clubs or Organizations: Patient refused    Attends Banker Meetings: Not on file    Marital Status: Patient refused  Intimate Partner Violence: Not At Risk (05/16/2022)   Humiliation, Afraid, Rape, and Kick questionnaire    Fear of Current or Ex-Partner: No    Emotionally Abused: No    Physically Abused: No    Sexually Abused: No    Family History:    Family History  Problem Relation Age of Onset   Rheum arthritis Mother    Diabetes Father    Heart disease Father    Cholecystitis Sister    Cholecystitis Brother    Diabetes Brother    Diabetes Brother    Parkinson's disease Brother    Heart disease Sister    Diabetes Sister    Cholecystitis Sister    Diabetes Sister    Arthritis Other    Hypertension Other    Stroke Other    Coronary artery disease Other    Birth defects Neg Hx      ROS:  Please see the history of present illness.   All other ROS reviewed and negative.     Physical Exam/Data:   Vitals:   05/28/22 0503 05/28/22 0700 05/28/22 0800 05/28/22 0900  BP: (!) 110/57 125/77 133/68 117/66  Pulse: 91 90 92 88  Resp: 20 20 (!) 22 14  Temp: 97.8 F (36.6 C)   97.7 F (36.5 C)  TempSrc: Oral   Oral  SpO2: 100% 100% 99% 100%  Weight:      Height:        Intake/Output Summary (Last 24 hours) at 05/28/2022 1026 Last data filed at 05/28/2022 0800 Gross per 24 hour  Intake 350 ml  Output 550 ml  Net -200 ml      05/27/2022    6:47 PM 05/20/2022    4:14 AM 05/19/2022   11:21 AM  Last 3 Weights  Weight (lbs) 230 lb 222 lb 7.1 oz 230 lb  Weight (kg) 104.327 kg 100.9 kg 104.327 kg     Body mass index is 34.97  kg/m.  General: Chronically ill-appearing, appears older than stated age, mild increased work of breathing HEENT: normal Neck: no JVD Vascular: No carotid bruits; Distal pulses 2+ bilaterally Cardiac:  normal S1, S2; RRR; no murmur.  Warm Lungs: Inspiratory crackles in bilateral lung fields.  Poor aeration to bilateral lung bases.  Mild increased work of breathing Abd: soft, nontender, no hepatomegaly  Ext: 1+ bilateral pitting edema Musculoskeletal:  Left BKA  Skin: warm and dry  Neuro:  CNs 2-12 intact, no focal abnormalities noted Psych:  Normal affect   EKG:  The EKG was personally reviewed and demonstrates: Sinus rhythm.  No ST elevation Telemetry:  Telemetry was personally reviewed and demonstrates: Sinus rhythm  Relevant CV Studies: Echo pending  Laboratory Data:  High Sensitivity Troponin:   Recent Labs  Lab 05/27/22 1846 05/27/22 2118  TROPONINIHS 803* 734*     Chemistry Recent Labs  Lab 05/22/22 0500 05/27/22 1846 05/28/22 0505  NA 136 128* 130*  K 3.6 3.6 3.9  CL 109 99 102  CO2 17* 19* 20*  GLUCOSE 133* 208* 223*  BUN 31* 59* 54*  CREATININE 1.45* 1.66* 1.55*  CALCIUM 7.8* 8.3* 8.0*  GFRNONAA 54* 46* 50*  ANIONGAP 10 10 8     Recent Labs  Lab 05/27/22 1846 05/28/22 0505  PROT 7.5 7.1  ALBUMIN 2.8*  --   AST 23  --   ALT 24  --   ALKPHOS 95  --   BILITOT 0.8  --    Lipids No results for input(s): "CHOL", "TRIG", "HDL", "LABVLDL", "LDLCALC", "CHOLHDL" in the last 168 hours.  Hematology Recent Labs  Lab 05/22/22 0500 05/27/22 1846 05/28/22 0505  WBC 13.0* 12.9* 13.0*  RBC 2.45* 3.05* 2.84*  HGB 7.8* 9.6* 9.0*  HCT 23.4* 29.1* 27.5*  MCV 95.5 95.4 96.8  MCH 31.8 31.5 31.7  MCHC 33.3 33.0 32.7  RDW 12.7 14.2 14.2  PLT 221 429* 352   Thyroid No results for input(s): "TSH", "FREET4" in the last 168 hours.  BNP Recent Labs  Lab 05/27/22 1851  BNP 1,547.0*    DDimer No results for input(s): "DDIMER" in the last 168  hours.   Radiology/Studies:  CT Angio Chest PE W and/or Wo Contrast  Result Date: 05/27/2022 CLINICAL DATA:  SOB EXAM: CT ANGIOGRAPHY CHEST WITH CONTRAST TECHNIQUE: Multidetector CT imaging of the chest was performed using the standard protocol during bolus administration of intravenous contrast. Multiplanar CT image reconstructions and MIPs were obtained to evaluate the vascular anatomy. RADIATION DOSE REDUCTION: This exam was performed according to the departmental dose-optimization program which includes automated exposure control, adjustment of the mA and/or kV according to patient size and/or use of iterative reconstruction technique. CONTRAST:  75 mL OMNIPAQUE IOHEXOL 350 MG/ML SOLN COMPARISON:  None Available. FINDINGS: Cardiovascular: Satisfactory opacification of the pulmonary arteries to the segmental level. No evidence of pulmonary embolism. Normal heart size. No pericardial effusion. Reflux of contrast into the IVC consistent with tricuspid insufficiency. No evidence of aortic aneurysm. Atheromatous changes noted of the aorta. Mediastinum/Nodes: 1.4 cm right hilar node. Few small mediastinal nodes. No mediastinal lesions identified. Lungs/Pleura: Large bilateral pleural effusions. Patchy alveolar process identified in the upper lobes likely an infectious process. Follow-up recommended to confirm resolution. Bibasilar, lingular and right middle lobe consolidation or atelectasis noted. Upper Abdomen: Nodular configuration liver suggests cirrhosis. Cholecystectomy clips. No adrenal lesions are identified. Musculoskeletal: Thoracic degenerative changes noted. Review of the MIP images confirms the above findings. IMPRESSION: 1. No evidence of PE. 2. Large pleural effusions. 3. Patchy nodular alveolar process upper lungs concerning for an infectious process for which follow up is recommended to confirm resolution. 4. Bibasilar atelectasis/consolidation. 5. Hilar adenopathy likely to be reactive and  follow up recommended. 6. Cirrhotic appearance of the liver. Electronically Signed   By: Layla MawJoshua  Pleasure M.D.   On: 05/27/2022 20:04   DG Chest 2 View  Result Date: 05/27/2022 CLINICAL  DATA:  Shortness of breath, postop hypoxia. EXAM: CHEST - 2 VIEW COMPARISON:  Chest radiograph dated FINDINGS: The heart is mildly enlarged. Pulmonary vascular congestion. Low lung volumes. Bibasilar opacities suggesting bilateral pleural effusions and/or atelectasis or infiltrate. IMPRESSION: Mild cardiomegaly with pulmonary vascular congestion. Bibasilar opacities suggesting bilateral pleural effusions and/or atelectasis or infiltrate. Electronically Signed   By: Larose Hires D.O.   On: 05/27/2022 19:21     Assessment and Plan:   Mr. Swire is a 66 year old chronically ill male who presented with new onset heart failure symptoms shortly after a below the knee amputation December 1.  His chest x-ray, lab work and physical exam are consistent with significant volume overload.  #New onset heart failure Echo pending to define ejection fraction.  NYHA class III Increase IV Lasix to 80 mg by mouth twice daily and monitor for urine output Keep potassium greater than 4 magnesium greater than 2 He will require a coronary evaluation prior to discharge.  Timing to be determined. If continues to be dyspneic despite diuresis, consider thoracentesis given large pleural effusions bilaterally  #Elevated troponin Likely secondary to heart failure although I cannot completely exclude a now completed infarct.  Echo will help define this better.  Coronary evaluation prior to discharge as above.  No active chest pain during my evaluation.  No ST elevation on EKG. -Continue aspirin 81 mg -Continue Plavix 75 mg once daily   Cardiology to follow along  For questions or updates, please contact [facility] Please consult [email or web address] for contact info under    Signed, Lanier Prude, MD  05/28/2022 10:26 AM

## 2022-05-28 NOTE — ED Notes (Signed)
Report given to Crystal RN 

## 2022-05-28 NOTE — ED Notes (Signed)
Pt is very anxious appearing. Will give PRN xanax

## 2022-05-28 NOTE — ED Notes (Signed)
RN to bedside to answer call bell. Pt feels very SOB. Pt asked to sit up on the side of the bed. Which placed him in the upright position better than the bed. Pt 98% on [place]. Pt advised he normally takes a xanax at night when at home. Will review MAR and ask for MD to order if not done so already.

## 2022-05-29 ENCOUNTER — Inpatient Hospital Stay: Payer: No Typology Code available for payment source

## 2022-05-29 ENCOUNTER — Other Ambulatory Visit (HOSPITAL_COMMUNITY): Payer: Self-pay

## 2022-05-29 ENCOUNTER — Telehealth (HOSPITAL_COMMUNITY): Payer: Self-pay | Admitting: Pharmacy Technician

## 2022-05-29 ENCOUNTER — Inpatient Hospital Stay (HOSPITAL_COMMUNITY)
Admit: 2022-05-29 | Discharge: 2022-05-29 | Disposition: A | Discharge status: Home / Self Care | Payer: No Typology Code available for payment source | Attending: Internal Medicine | Admitting: Internal Medicine

## 2022-05-29 DIAGNOSIS — J810 Acute pulmonary edema: Secondary | ICD-10-CM

## 2022-05-29 DIAGNOSIS — I5043 Acute on chronic combined systolic (congestive) and diastolic (congestive) heart failure: Secondary | ICD-10-CM | POA: Diagnosis not present

## 2022-05-29 DIAGNOSIS — J90 Pleural effusion, not elsewhere classified: Secondary | ICD-10-CM | POA: Diagnosis not present

## 2022-05-29 DIAGNOSIS — I5031 Acute diastolic (congestive) heart failure: Secondary | ICD-10-CM | POA: Diagnosis not present

## 2022-05-29 DIAGNOSIS — N179 Acute kidney failure, unspecified: Secondary | ICD-10-CM | POA: Diagnosis not present

## 2022-05-29 DIAGNOSIS — E1169 Type 2 diabetes mellitus with other specified complication: Secondary | ICD-10-CM

## 2022-05-29 DIAGNOSIS — R778 Other specified abnormalities of plasma proteins: Secondary | ICD-10-CM | POA: Diagnosis not present

## 2022-05-29 DIAGNOSIS — J9601 Acute respiratory failure with hypoxia: Secondary | ICD-10-CM

## 2022-05-29 DIAGNOSIS — I5033 Acute on chronic diastolic (congestive) heart failure: Secondary | ICD-10-CM | POA: Diagnosis not present

## 2022-05-29 LAB — LACTATE DEHYDROGENASE, PLEURAL OR PERITONEAL FLUID: LD, Fluid: 63 U/L — ABNORMAL HIGH (ref 3–23)

## 2022-05-29 LAB — ECHOCARDIOGRAM COMPLETE
AR max vel: 2.07 cm2
AV Area VTI: 2.81 cm2
AV Area mean vel: 2.53 cm2
AV Mean grad: 2 mmHg
AV Peak grad: 4.2 mmHg
Ao pk vel: 1.03 m/s
Area-P 1/2: 4.68 cm2
Calc EF: 27.4 %
Height: 68 in
S' Lateral: 4 cm
Single Plane A2C EF: 28.4 %
Single Plane A4C EF: 25.3 %
Weight: 3680 oz

## 2022-05-29 LAB — BASIC METABOLIC PANEL
Anion gap: 10 (ref 5–15)
BUN: 41 mg/dL — ABNORMAL HIGH (ref 8–23)
CO2: 22 mmol/L (ref 22–32)
Calcium: 8 mg/dL — ABNORMAL LOW (ref 8.9–10.3)
Chloride: 104 mmol/L (ref 98–111)
Creatinine, Ser: 1.29 mg/dL — ABNORMAL HIGH (ref 0.61–1.24)
GFR, Estimated: >60 mL/min (ref >60)
Glucose, Bld: 145 mg/dL — ABNORMAL HIGH (ref 70–99)
Potassium: 3.7 mmol/L (ref 3.5–5.1)
Sodium: 136 mmol/L (ref 135–145)

## 2022-05-29 LAB — CBC
HCT: 28.5 % — ABNORMAL LOW (ref 39.0–52.0)
Hemoglobin: 9.3 g/dL — ABNORMAL LOW (ref 13.0–17.0)
MCH: 31.3 pg (ref 26.0–34.0)
MCHC: 32.6 g/dL (ref 30.0–36.0)
MCV: 96 fL (ref 80.0–100.0)
Platelets: 371 10*3/uL (ref 150–400)
RBC: 2.97 MIL/uL — ABNORMAL LOW (ref 4.22–5.81)
RDW: 14.9 % (ref 11.5–15.5)
WBC: 10 10*3/uL (ref 4.0–10.5)
nRBC: 0 % (ref 0.0–0.2)

## 2022-05-29 LAB — CBG MONITORING, ED
Glucose-Capillary: 122 mg/dL — ABNORMAL HIGH (ref 70–99)
Glucose-Capillary: 107 mg/dL — ABNORMAL HIGH (ref 70–99)
Glucose-Capillary: 181 mg/dL — ABNORMAL HIGH (ref 70–99)
Glucose-Capillary: 146 mg/dL — ABNORMAL HIGH (ref 70–99)
Glucose-Capillary: 138 mg/dL — ABNORMAL HIGH (ref 70–99)
Glucose-Capillary: 104 mg/dL — ABNORMAL HIGH (ref 70–99)

## 2022-05-29 LAB — PROTEIN, PLEURAL OR PERITONEAL FLUID: Total protein, fluid: <3 g/dL

## 2022-05-29 MED ORDER — LIDOCAINE HCL (PF) 1 % IJ SOLN
5.0000 mL | Freq: Once | INTRAMUSCULAR | Status: AC
  Administered 2022-05-29: 5 mL via INTRADERMAL

## 2022-05-29 MED ORDER — SPIRONOLACTONE 12.5 MG HALF TABLET
12.5000 mg | ORAL_TABLET | Freq: Every day | ORAL | Status: DC
  Administered 2022-05-29 – 2022-06-01 (×4): 12.5 mg via ORAL
  Filled 2022-05-29 (×5): qty 1

## 2022-05-29 NOTE — Progress Notes (Signed)
OT Screen Note  Patient Details Name: [name] MRN: [id number] DOB: 1956/04/26   Cancelled Treatment:    Reason Eval/Treat Not Completed: OT screened, no needs identified, will sign off.   Pt received in bed, doing LE exercises under the covers, on 2L O2 (sat 97%). States he feels much better after procedure this morning to help his breathing. States that he has been doing well at home, SBA for t/f's (pt prefers rollator over RW), (I) ADLs from bed/w/c level, has plans to put in a ramp and a tub transfer bench (was doing HHOT and HHPT prior to admission), has all needed DME (rollator, RW, BSC, transport w/c, regular w/c, SPC, shower chair, reachers). Pt's wife works from home (as does pt) and he states all was going well with his rehab at home. Has already mobilized to EOB with PT today. Pt declines ADLs at this time. No further needs in acute care. Return home with resumption of HHOT.   Alvester Morin 05/29/2022, 11:02 AM

## 2022-05-29 NOTE — Procedures (Signed)
PROCEDURE SUMMARY:  Successful image-guided right thoracentesis. Yielded 1.2L of clear yellow fluid. Pt tolerated procedure well. No immediate complications. EBL = trace   Specimen was sent for labs. CXR ordered.  Please see imaging section of Epic for full dictation.  Kennieth Francois PA-C 05/29/2022 10:18 AM

## 2022-05-29 NOTE — Discharge Instructions (Signed)
Low Sodium Nutrition Therapy  Eating less sodium can help you if you have high blood pressure, heart failure, or kidney or liver disease.   Your body needs a little sodium, but too much sodium can cause your body to hold onto extra water. This extra water will raise your blood pressure and can cause damage to your heart, kidneys, or liver as they are forced to work harder.   Sometimes you can see how the extra fluid affects you because your hands, legs, or belly swell. You may also hold water around your heart and lungs, which makes it hard to breathe.   Even if you take medication for blood pressure or a water pill (diuretic) to remove fluid, it is still important to have less salt in your diet.   Check with your primary care provider before drinking alcohol since it may affect the amount of fluid in your body and how your heart, kidneys, or liver work. Sodium in Food A low-sodium meal plan limits the sodium that you get from food and beverages to 1,500-2,000 milligrams (mg) per day. Salt is the main source of sodium. Read the nutrition label on the package to find out how much sodium is in one serving of a food.  Select foods with 140 milligrams (mg) of sodium or less per serving.  You may be able to eat one or two servings of foods with a little more than 140 milligrams (mg) of sodium if you are closely watching how much sodium you eat in a day.  Check the serving size on the label. The amount of sodium listed on the label shows the amount in one serving of the food. So, if you eat more than one serving, you will get more sodium than the amount listed.  Tips Cutting Back on Sodium Eat more fresh foods.  Fresh fruits and vegetables are low in sodium, as well as frozen vegetables and fruits that have no added juices or sauces.  Fresh meats are lower in sodium than processed meats, such as bacon, sausage, and hotdogs.  Not all processed foods are unhealthy, but some processed foods may have too  much sodium.  Eat less salt at the table and when cooking. One of the ingredients in salt is sodium.  One teaspoon of table salt has 2,300 milligrams of sodium.  Leave the salt out of recipes for pasta, casseroles, and soups. Be a Engineer, building services.  Food packages that say "Salt-free", sodium-free", "very low sodium," and "low sodium" have less than 140 milligrams of sodium per serving.  Beware of products identified as "Unsalted," "No Salt Added," "Reduced Sodium," or "Lower Sodium." These items may still be high in sodium. You should always check the nutrition label. Add flavors to your food without adding sodium.  Try lemon juice, lime juice, or vinegar.  Dry or fresh herbs add flavor.  Buy a sodium-free seasoning blend or make your own at home. You can purchase salt-free or sodium-free condiments like barbeque sauce in stores and online. Ask your registered dietitian nutritionist for recommendations and where to find them.   Eating in Restaurants Choose foods carefully when you eat outside your home. Restaurant foods can be very high in sodium. Many restaurants provide nutrition facts on their menus or their websites. If you cannot find that information, ask your server. Let your server know that you want your food to be cooked without salt and that you would like your salad dressing and sauces to be served on the  side.    Foods Recommended Food Group Foods Recommended  Grains Bread, bagels, rolls without salted tops Homemade bread made with reduced-sodium baking powder Cold cereals, especially shredded wheat and puffed rice Oats, grits, or cream of wheat Pastas, quinoa, and rice Popcorn, pretzels or crackers without salt Corn tortillas  Protein Foods Fresh meats and fish; [place] bacon (check the nutrition labels - make sure they are not packaged in a sodium solution) Canned or packed tuna (no more than 4 ounces at 1 serving) Beans and peas Soybeans) and tofu Eggs Nuts or nut butters  without salt  Dairy Milk or milk powder Plant milks, such as rice and soy Yogurt, including Greek yogurt Small amounts of natural cheese (blocks of cheese) or reduced-sodium cheese can be used in moderation. (Swiss, ricotta, and fresh mozzarella cheese are lower in sodium than the others) Cream Cheese Low sodium cottage cheese  Vegetables Fresh and frozen vegetables without added sauces or salt Homemade soups (without salt) Low-sodium, salt-free or sodium-free canned vegetables and soups  Fruit Fresh and canned fruits Dried fruits, such as raisins, cranberries, and prunes  Oils Tub or liquid margarine, regular or without salt Canola, corn, peanut, olive, safflower, or sunflower oils  Condiments Fresh or dried herbs such as basil, bay leaf, dill, mustard (dry), nutmeg, paprika, parsley, rosemary, sage, or thyme.  Low sodium ketchup Vinegar  Lemon or lime juice Pepper, red pepper flakes, and cayenne. Hot sauce contains sodium, but if you use just a drop or two, it will not add up to much.  Salt-free or sodium-free seasoning mixes and marinades Simple salad dressings: vinegar and oil   Foods Not Recommended Food Group Foods Not Recommended  Grains Breads or crackers topped with salt Cereals (hot/cold) with more than 300 mg sodium per serving Biscuits, cornbread, and other "quick" breads prepared with baking soda Pre-packaged bread crumbs Seasoned and packaged rice and pasta mixes Self-rising flours  Protein Foods Cured meats: Bacon, ham, sausage, pepperoni and hot dogs Canned meats (chili, vienna sausage, or sardines) Smoked fish and meats Frozen meals that have more than 600 mg of sodium per serving Egg substitute (with added sodium)  Dairy Buttermilk Processed cheese spreads Cottage cheese (1 cup may have over 500 mg of sodium; look for low-sodium.) American or feta cheese Shredded Cheese has more sodium than blocks of cheese String cheese  Vegetables Canned vegetables  (unless they are salt-free, sodium-free or low sodium) Frozen vegetables with seasoning and sauces Sauerkraut and pickled vegetables Canned or dried soups (unless they are salt-free, sodium-free, or low sodium) [place] fries and onion rings  Fruit Dried fruits preserved with additives that have sodium  Oils Salted butter or margarine, all types of olives  Condiments Salt, sea salt, kosher salt, onion salt, and garlic salt Seasoning mixes with salt Bouillon cubes Ketchup Barbeque sauce and Worcestershire sauce unless low sodium Soy sauce Salsa, pickles, olives, relish Salad dressings: ranch, blue cheese, [place], and [place].   Low Sodium Sample 1-Day Menu  Breakfast 1 cup cooked oatmeal  1 slice whole wheat bread toast  1 tablespoon peanut butter without salt  1 banana  1 cup 1% milk  Lunch Tacos made with: 2 corn tortillas   cup black beans, low sodium   cup roasted or grilled chicken (without skin)   avocado  Squeeze of lime juice  1 cup salad greens  1 tablespoon low-sodium salad dressing   cup strawberries  1 orange  Afternoon Snack 1/3 cup grapes  6 ounces yogurt  Evening Meal 3 ounces herb-baked fish  1 baked potato  2 teaspoons olive oil   cup cooked carrots  2 thick slices tomatoes on:  2 lettuce leaves  1 teaspoon olive oil  1 teaspoon balsamic vinegar  1 cup 1% milk  Evening Snack 1 apple   cup almonds without salt   Low-Sodium Vegetarian (Lacto-Ovo) Sample 1-Day Menu  Breakfast 1 cup cooked oatmeal  1 slice whole wheat toast  1 tablespoon peanut butter without salt  1 banana  1 cup 1% milk  Lunch Tacos made with: 2 corn tortillas   cup black beans, low sodium   cup roasted or grilled chicken (without skin)   avocado  Squeeze of lime juice  1 cup salad greens  1 tablespoon low-sodium salad dressing   cup strawberries  1 orange  Evening Meal Stir fry made with:  cup tofu  1 cup brown rice   cup broccoli   cup green beans   cup  peppers   tablespoon peanut oil  1 orange  1 cup 1% milk  Evening Snack 4 strips celery  2 tablespoons hummus  1 hard-boiled egg   Low-Sodium Vegan Sample 1-Day Menu  Breakfast 1 cup cooked oatmeal  1 tablespoon peanut butter without salt  1 cup blueberries  1 cup soymilk fortified with calcium, vitamin B12, and vitamin D  Lunch 1 small whole wheat pita   cup cooked lentils  2 tablespoons hummus  4 carrot sticks  1 medium apple  1 cup soymilk fortified with calcium, vitamin B12, and vitamin D  Evening Meal Stir fry made with:  cup tofu  1 cup brown rice   cup broccoli   cup green beans   cup peppers   tablespoon peanut oil  1 cup cantaloupe  Evening Snack 1 cup soy yogurt   cup mixed nuts  Copyright 2020  Academy [employer]. All rights reserved  Sodium Free Flavoring Tips  When cooking, the following items may be used for flavoring instead of salt or seasonings that contain sodium. Remember: A little bit of spice goes a Shimp way! Be careful not to overseason. Spice Blend Recipe (makes about ? cup) 5 teaspoons onion powder  2 teaspoons garlic powder  2 teaspoons paprika  2 teaspoon dry mustard  1 teaspoon crushed thyme leaves   teaspoon white pepper   teaspoon celery seed Food Item Flavorings  Beef Basil, bay leaf, caraway, curry, dill, dry mustard, garlic, grape jelly, green pepper, mace, marjoram, mushrooms (fresh), nutmeg, onion or onion powder, parsley, pepper, rosemary, sage  Chicken Basil, cloves, cranberries, mace, mushrooms (fresh), nutmeg, oregano, paprika, parsley, pineapple, saffron, sage, savory, tarragon, thyme, tomato, turmeric  Egg Chervil, curry, dill, dry mustard, garlic or garlic powder, green pepper, jelly, mushrooms (fresh), nutmeg, onion powder, paprika, parsley, rosemary, tarragon, tomato  Fish Basil, bay leaf, chervil, curry, dill, dry mustard, green pepper, lemon juice, marjoram, mushrooms (fresh), paprika, pepper,  tarragon, tomato, turmeric  Lamb Cloves, curry, dill, garlic or garlic powder, mace, mint, mint jelly, onion, oregano, parsley, pineapple, rosemary, tarragon, thyme  Pork Applesauce, basil, caraway, chives, cloves, garlic or garlic powder, onion or onion powder, rosemary, thyme  Veal Apricots, basil, bay leaf, currant jelly, curry, ginger, marjoram, mushrooms (fresh), oregano, paprika  Vegetables Basil, dill, garlic or garlic powder, ginger, lemon juice, mace, marjoram, nutmeg, onion or onion powder, tarragon, tomato, sugar or sugar substitute, salt-free salad dressing, vinegar  Desserts Allspice, anise, cinnamon, cloves, ginger, mace, nutmeg, vanilla extract, other  extracts   Copyright 2020  Academy of [employer]etics. All rights reserved  Fluid Restricted Nutrition Therapy  You have been prescribed this diet because your condition affects how much fluid you can eat or drink. If your heart, liver, or kidneys aren't working properly, you may not be able to effectively eliminate fluids from the body and this may cause swelling (edema) in the legs, arms, and/or stomach. Drink no more than _________ liters or ________ ounces or ________cups of fluid per day.  You don't need to stop eating or drinking the same fluids you normally would, but you may need to eat or drink less than usual.  Your registered dietitian nutritionist will help you determine the correct amount of fluid to consume during the day Breakfast Include fluids taken with medications  Lunch Include fluids taken with medications  Dinner Include fluids taken with medications  Bedtime Snack Include fluids taken with medications     Tips What Are Fluids?  A fluid is anything that is liquid or anything that would melt if left at room temperature. You will need to count these foods and liquids--including any liquid used to take medication--as part of your daily fluid intake. Some examples are: Alcohol (drink only with your  doctor's permission)  Coffee, tea, and other hot beverages  Gelatin (Jell-O)  Gravy  Ice cream, sherbet, sorbet  Ice cubes, ice chips  Milk, liquid creamer  Nutritional supplements  Popsicles  Vegetable and fruit juices; fluid in canned fruit  Watermelon  Yogurt  Soft drinks, lemonade, limeade  Soups  Syrup How Do I Measure My Fluid Intake? Record your fluid intake daily.  Tip: Every day, each time you eat or drink fluids, pour water in the same amount into an empty container that can hold the same amount of fluids you are allowed daily. This may help you keep track of how much fluid you are taking in throughout the day.  To accurately keep track of how much liquid you take in, measure the size of the cups, glasses, and bowls you use. If you eat soup, measure how much of it is liquid and how much is solid (such as noodles, vegetables, meat). Conversions for Measuring Fluid Intake  Milliliters (mL) Liters (L) Ounces (oz) Cups (c)  1000 1 32 4  1200 1.2 40 5  1500 1.5 50 6 1/4  1800 1.8 60 7 1/2  2000 2 67 8 1/3  Tips to Reduce Your Thirst Chew gum or suck on hard candy.  Rinse or gargle with mouthwash. Do not swallow.  Ice chips or popsicles my help quench thirst, but this too needs to be calculated into the total restriction. Melt ice chips or cubes first to figure out how much fluid they produce (for example, experiment with melting  cup ice chips or 2 ice cubes).  Add a lemon wedge to your water.  Limit how much salt you take in. A high salt intake might make you thirstier.  Don't eat or drink all your allowed liquids at once. Space your liquids out through the day.  Use small glasses and cups and sip slowly. If allowed, take your medications with fluids you eat or drink during a meal.   Fluid-Restricted Nutrition Therapy Sample 1-Day Menu  Breakfast 1 slice wheat toast  1 tablespoon peanut butter  1/2 cup yogurt (120 milliliters)  1/2 cup blueberries  1 cup milk (240  milliliters)   Lunch 3 ounces sliced [place]  2 slices whole wheat  bread  1/2 cup lettuce for sandwich  2 slices tomato for sandwich  1 ounce reduced-fat, reduced-sodium cheese  1/2 cup fresh carrot sticks  1 banana  1 cup unsweetened tea (240 milliliters)   Evening Meal 8 ounces soup (240 milliliters)  3 ounces salmon  1/2 cup quinoa  1 cup green beans  1 cup mixed greens salad  1 tablespoon olive oil  1 cup coffee (240 milliliters)  Evening Snack 1/2 cup sliced peaches  1/2 cup frozen yogurt (120 milliliters)  1 cup water (240 milliliters)  Copyright 2020  Academy of [employer] Dietetics. All rights reserved   Heart Healthy, Consistent Carbohydrate Nutrition Therapy   A heart-healthy and consistent carbohydrate diet is recommended to manage heart disease and diabetes. To follow a heart-healthy and consistent carbohydrate diet, Eat a balanced diet with whole grains, fruits and vegetables, and lean protein sources.  Choose heart-healthy unsaturated fats. Limit saturated fats, trans fats, and cholesterol intake. Eat more plant-based or vegetarian meals using beans and soy foods for protein.  Eat whole, unprocessed foods to limit the amount of sodium (salt) you eat.  Choose a consistent amount of carbohydrate at each meal and snack. Limit refined carbohydrates especially sugar, sweets and sugar-sweetened beverages.  If you drink alcohol, do so in moderation: one serving per day (women) and two servings per day (men). o One serving is equivalent to 12 ounces beer, 5 ounces wine, or 1.5 ounces distilled spirits  Tips Tips for Choosing Heart-Healthy Fats Choose lean protein and low-fat dairy foods to reduce saturated fat intake. Saturated fat is usually found in animal-based protein and is associated with certain health risks. Saturated fat is the biggest contributor to raise low-density lipoprotein (LDL) cholesterol levels. Research shows that limiting saturated fat lowers unhealthy  cholesterol levels. Eat no more than 7% of your total calories each day from saturated fat. Ask your RDN to help you determine how much saturated fat is right for you. There are many foods that do not contain large amounts of saturated fats. Swapping these foods to replace foods high in saturated fats will help you limit the saturated fat you eat and improve your cholesterol levels. You can also try eating more plant-based or vegetarian meals. Instead of. Try:  Whole milk, cheese, yogurt, and ice cream 1% or skim milk, low-fat cheese, non-fat yogurt, and low-fat ice cream  Fatty, marbled beef and pork Lean beef, pork, or venison  Poultry with skin Poultry without skin  Butter, stick margarine Reduced-fat, whipped, or liquid spreads  Coconut oil, palm oil Liquid vegetable oils: corn, canola, olive, soybean and safflower oils   Avoid foods that contain trans fats. Trans fats increase levels of LDL-cholesterol. Hydrogenated fat in processed foods is the main source of trans fats in foods.  Trans fats can be found in stick margarine, shortening, processed sweets, baked goods, some fried foods, and packaged foods made with hydrogenated oils. Avoid foods with "partially hydrogenated oil" on the ingredient list such as: cookies, pastries, baked goods, biscuits, crackers, microwave popcorn, and frozen dinners. Choose foods with heart healthy fats. Polyunsaturated and monounsaturated fat are unsaturated fats that may help lower your blood cholesterol level when used in place of saturated fat in your diet. Ask your RDN about taking a dietary supplement with plant sterols and stanols to help lower your cholesterol level. Research shows that substituting saturated fats with unsaturated fats is beneficial to cholesterol levels. Try these easy swaps: Instead of. Try:  Butter, stick margarine, or  solid shortening Reduced-fat, whipped, or liquid spreads  Beef, pork, or poultry with skin Fish and seafood  Chips,  crackers, snack foods Raw or unsalted nuts and seeds or nut butters Hummus with vegetables Avocado on toast  Coconut oil, palm oil Liquid vegetable oils: corn, canola, olive, soybean and safflower oils  Limit the amount of cholesterol you eat to less than 200 milligrams per day. Cholesterol is a substance carried through the bloodstream via lipoproteins, which are known as "transporters" of fat. Some body functions need cholesterol to work properly, but too much cholesterol in the bloodstream can damage arteries and build up blood vessel linings (which can lead to heart attack and stroke). You should eat less than 200 milligrams cholesterol per day. People respond differently to eating cholesterol. There is no test available right now that can figure out which people will respond more to dietary cholesterol and which will respond less. For individuals with high intake of dietary cholesterol, different types of increase (none, small, moderate, large) in LDL-cholesterol levels are all possible.  Food sources of cholesterol include egg yolks and organ meats such as liver, gizzards. Limit egg yolks to two to four per week and avoid organ meats like liver and gizzards to control cholesterol intake. Tips for Choosing Heart-Healthy Carbohydrates Consume a consistent amount of carbohydrate It is important to eat foods with carbohydrates in moderation because they impact your blood glucose level. Carbohydrates can be found in many foods such as: Grains (breads, crackers, rice, pasta, and cereals)  Starchy Vegetables (potatoes, corn, and peas)  Beans and legumes  Milk, soy milk, and yogurt  Fruit and fruit juice  Sweets (cakes, cookies, ice cream, jam and jelly) Your RDN will help you set a goal for how many carbohydrate servings to eat at your meals and snacks. For many adults, eating 3 to 5 servings of carbohydrate foods at each meal and 1 or 2 carbohydrate servings for each snack works well.  Check your  blood glucose level regularly. It can tell you if you need to adjust when you eat carbohydrates. Choose foods rich in viscous (soluble) fiber Viscous, or soluble, is found in the walls of plant cells. Viscous fiber is found only in plant-based foods. Eating foods with fiber helps to lower your unhealthy cholesterol and keep your blood glucose in range  Rich sources of viscous fiber include vegetables (asparagus, Brussels sprouts, sweet potatoes, turnips) fruit (apricots, mangoes, oranges), legumes, and whole grains (barley, oats, and oat bran).  As you increase your fiber intake gradually, also increase the amount of water you drink. This will help prevent constipation.  If you have difficulty achieving this goal, ask your RDN about fiber laxatives. Choose fiber supplements made with viscous fibers such as psyllium seed husks or methylcellulose to help lower unhealthy cholesterol.  Limit refined carbohydrates  There are three types of carbohydrates: starches, sugar, and fiber. Some carbohydrates occur naturally in food, like the starches in rice or corn or the sugars in fruits and milk. Refined carbohydrates--foods with high amounts of simple sugars--can raise triglyceride levels. High triglyceride levels are associated with coronary heart disease. Some examples of refined carbohydrate foods are table sugar, sweets, and beverages sweetened with added sugar. Tips for Reducing Sodium (Salt) Although sodium is important for your body to function, too much sodium can be harmful for people with high blood pressure. As sodium and fluid buildup in your tissues and bloodstream, your blood pressure increases. High blood pressure may cause damage to other organs  and increase your risk for a stroke. Even if you take a pill for blood pressure or a water pill (diuretic) to remove fluid, it is still important to have less salt in your diet. Ask your doctor and RDN what amount of sodium is right for you. Avoid  processed foods. Eat more fresh foods.  Fresh fruits and vegetables are naturally low in sodium, as well as frozen vegetables and fruits that have no added juices or sauces.  Fresh meats are lower in sodium than processed meats, such as bacon, sausage, and hotdogs. Read the nutrition label or ask your butcher to help you find a fresh meat that is low in sodium. Eat less salt--at the table and when cooking.  A single teaspoon of table salt has 2,300 mg of sodium.  Leave the salt out of recipes for pasta, casseroles, and soups.  Ask your RDN how to cook your favorite recipes without sodium Be a smart shopper.  Look for food packages that say "salt-free" or "sodium-free." These items contain less than 5 milligrams of sodium per serving.  "Very low-sodium" products contain less than 35 milligrams of sodium per serving.  "Low-sodium" products contain less than 140 milligrams of sodium per serving.  Beware for "Unsalted" or "No Added Salt" products. These items may still be high in sodium. Check the nutrition label. Add flavors to your food without adding sodium.  Try lemon juice, lime juice, fruit juice or vinegar.  Dry or fresh herbs add flavor. Try basil, bay leaf, dill, rosemary, parsley, sage, dry mustard, nutmeg, thyme, and paprika.  Pepper, red pepper flakes, and cayenne pepper can add spice t your meals without adding sodium. Hot sauce contains sodium, but if you use just a drop or two, it will not add up to much.  Buy a sodium-free seasoning blend or make your own at home. Additional Lifestyle Tips Achieve and maintain a healthy weight. Talk with your RDN or your doctor about what is a healthy weight for you. Set goals to reach and maintain that weight.  To lose weight, reduce your calorie intake along with increasing your physical activity. A weight loss of 10 to 15 pounds could reduce LDL-cholesterol by 5 milligrams per deciliter. Participate in physical activity. Talk with your health  care team to find out what types of physical activity are best for you. Set a plan to get about 30 minutes of exercise on most days.  Foods Recommended Food Group Foods Recommended  Grains Whole grain breads and cereals, including whole wheat, barley, rye, buckwheat, corn, teff, quinoa, millet, amaranth, brown or wild rice, sorghum, and oats Pasta, especially whole wheat or other whole grain types  [employer], quinoa or wild rice Whole grain crackers, bread, rolls, pitas Home-made bread with reduced-sodium baking soda  Protein Foods Lean cuts of beef and pork (loin, leg, round, extra lean hamburger)  Skinless Press photographer and other wild game Dried beans and peas Nuts and nut butters Meat alternatives made with soy or textured vegetable protein  Egg whites or egg substitute Cold cuts made with lean meat or soy protein  Dairy Nonfat (skim), low-fat, or 1%-fat milk  Nonfat or low-fat yogurt or cottage cheese Fat-free and low-fat cheese  Vegetables Fresh, frozen, or canned vegetables without added fat or salt   Fruits Fresh, frozen, canned, or dried fruit   Oils Unsaturated oils (corn, olive, peanut, soy, sunflower, canola)  Soft or liquid margarines and vegetable oil spreads  Salad dressings Seeds and nuts  Avocado   Foods Not Recommended Food Group Foods Not Recommended  Grains Breads or crackers topped with salt Cereals (hot or cold) with more than 300 mg sodium per serving Biscuits, cornbread, and other "quick" breads prepared with baking soda Bread crumbs or stuffing mix from a store High-fat bakery products, such as doughnuts, biscuits, croissants, danish pastries, pies, cookies Instant cooking foods to which you add hot water and stir--potatoes, noodles, rice, etc. Packaged starchy foods--seasoned noodle or rice dishes, stuffing mix, macaroni and cheese dinner Snacks made with partially hydrogenated oils, including chips, cheese puffs, snack mixes, regular crackers,  butter-flavored popcorn  Protein Foods Higher-fat cuts of meats (ribs, t-bone steak, regular hamburger) Bacon, sausage, or hot dogs Cold cuts, such as salami or bologna, deli meats, cured meats, corned beef Organ meats (liver, brains, gizzards, sweetbreads) Poultry with skin Fried or smoked meat, poultry, and fish Whole eggs and egg yolks (more than 2-4 per week) Salted legumes, nuts, seeds, or nut/seed butters Meat alternatives with high levels of sodium (>300 mg per serving) or saturated fat (>5 g per serving)  Dairy Whole milk,?2% fat milk, buttermilk Whole milk yogurt or ice cream Cream Half-&-half Cream cheese Sour cream Cheese  Vegetables Canned or frozen vegetables with salt, fresh vegetables prepared with salt, butter, cheese, or cream sauce Fried vegetables Pickled vegetables such as olives, pickles, or sauerkraut  Fruits Fried fruits Fruits served with butter or cream  Oils Butter, stick margarine, shortening Partially hydrogenated oils or trans fats Tropical oils (coconut, palm, palm kernel oils)  Other Candy, sugar sweetened soft drinks and desserts Salt, sea salt, garlic salt, and seasoning mixes containing salt Bouillon cubes Ketchup, barbecue sauce, Worcestershire sauce, soy sauce, teriyaki sauce Miso Salsa Pickles, olives, relish   Heart Healthy Consistent Carbohydrate Vegetarian (Lacto-Ovo) Sample 1-Day Menu  Breakfast 1 cup oatmeal, cooked (2 carbohydrate servings)   cup blueberries (1 carbohydrate serving)  11 almonds, without salt  1 cup 1% milk (1 carbohydrate serving)  1 cup coffee  Morning Snack 1 cup fat-free plain yogurt (1 carbohydrate serving)  Lunch 1 whole wheat bun (1 carbohydrate servings)  1 black bean burger (1 carbohydrate servings)  1 slice cheddar cheese, low sodium  2 slices tomatoes  2 leaves lettuce  1 teaspoon mustard  1 small pear (1 carbohydrate servings)  1 cup green tea, unsweetened  Afternoon Snack 1/3 cup trail mix with  nuts, seeds, and raisins, without salt (1 carbohydrate servinga)  Evening Meal  cup meatless chicken  2/3 cup brown rice, cooked (2 carbohydrate servings)  1 cup broccoli, cooked (2/3 carbohydrate serving)   cup carrots, cooked (1/3 carbohydrate serving)  2 teaspoons olive oil  1 teaspoon balsamic vinegar  1 whole wheat dinner roll (1 carbohydrate serving)  1 teaspoon margarine, soft, tub  1 cup 1% milk (1 carbohydrate serving)  Evening Snack 1 extra small banana (1 carbohydrate serving)  1 tablespoon peanut butter   Heart Healthy Consistent Carbohydrate Vegan Sample 1-Day Menu  Breakfast 1 cup oatmeal, cooked (2 carbohydrate servings)   cup blueberries (1 carbohydrate serving)  11 almonds, without salt  1 cup soymilk fortified with calcium, vitamin B12, and vitamin D  1 cup coffee  Morning Snack 6 ounces soy yogurt (1 carbohydrate servings)  Lunch 1 whole wheat bun(1 carbohydrate servings)  1 black bean burger (1 carbohydrate serving)  2 slices tomatoes  2 leaves lettuce  1 teaspoon mustard  1 small pear (1 carbohydrate servings)  1 cup green tea, unsweetened  Afternoon  Snack 1/3 cup trail mix with nuts, seeds, and raisins, without salt (1 carbohydrate servings)  Evening Meal  cup meatless chicken  2/3 cup brown rice, cooked (2 carbohydrate servings)  1 cup broccoli, cooked (2/3 carbohydrate serving)   cup carrots, cooked (1/3 carbohydrate serving)  2 teaspoons olive oil  1 teaspoon balsamic vinegar  1 whole wheat dinner roll (1 carbohydrate serving)  1 teaspoon margarine, soft, tub  1 cup soymilk fortified with calcium, vitamin B12, and vitamin D  Evening Snack 1 extra small banana (1 carbohydrate serving)  1 tablespoon peanut butter    Heart Healthy Consistent Carbohydrate Sample 1-Day Menu  Breakfast 1 cup cooked oatmeal (2 carbohydrate servings)  3/4 cup blueberries (1 carbohydrate serving)  1 ounce almonds  1 cup skim milk (1 carbohydrate serving)  1 cup  coffee  Morning Snack 1 cup sugar-free nonfat yogurt (1 carbohydrate serving)  Lunch 2 slices whole-wheat bread (2 carbohydrate servings)  2 ounces lean [place] breast  1 ounce low-fat Swiss cheese  1 teaspoon mustard  1 slice tomato  1 lettuce leaf  1 small pear (1 carbohydrate serving)  1 cup skim milk (1 carbohydrate serving)  Afternoon Snack 1 ounce trail mix with unsalted nuts, seeds, and raisins (1 carbohydrate serving)  Evening Meal 3 ounces salmon  2/3 cup cooked brown rice (2 carbohydrate servings)  1 teaspoon soft margarine  1 cup cooked broccoli with 1/2 cup cooked carrots (1 carbohydrate serving  Carrots, cooked, boiled, drained, without salt  1 cup lettuce  1 teaspoon olive oil with vinegar for dressing  1 small whole grain roll (1 carbohydrate serving)  1 teaspoon soft margarine  1 cup unsweetened tea  Evening Snack 1 extra-small banana (1 carbohydrate serving)  Copyright 2020  [employer]. All rights reserved.   Tips For Adding Protein Nutrition Therapy  Patients may be advised to increase the protein in their diet but not necessarily the calories as well. However, note that when adding protein to your diet, you will also be adding extra calories. The following suggestions may help add the extra protein while keeping the calories as low as possible.  Tips Add extra egg to one or more meals  Increase the portion of milk to drink and change to skim milk if able  Include [place] yogurt or cottage cheese for snack or part of a meal  Increase portion size of protein entre and decrease portion of starch/bread  Mix protein powder, nut butter, almond/nut milk, non-fat dry milk, or Greek yogurt to shakes and smoothies  Use these ingredients also in baked goods or other recipes Use double the amount of sandwich filling  Add protein foods to all snacks including cheese, nut butters, milk and yogurt Food Tips for Including Protein  Beans Cook and use  dried peas, beans, and tofu in soups or add to casseroles, pastas, and grain dishes that also contain cheese or meat  Mash with cheese and milk  Use tofu to make smoothies  Commercial Protein Supplements Use nutritional supplements or protein powder sold at pharmacies and grocery stores  Use protein powder in milk drinks and desserts, such as pudding  Mix with ice cream, milk, and fruit or other flavorings for a high-protein milkshake  Cottage Cheese or [employer] Mix with or use to stuff fruits and vegetables  Add to casseroles, spaghetti, noodles, or egg dishes such as omelets, scrambled eggs, and souffls  Use gelatin, pudding-type desserts, cheesecake, and pancake or waffle batter  Use to stuff crepes, pasta shells, or manicotti  Puree and use as a substitute for sour cream  Eggs, Egg whites, and Egg Yolks Add chopped, hard-cooked eggs to salads and dressings, vegetables, casseroles, and creamed meats  Beat eggs into mashed potatoes, vegetable purees, and sauces  Add extra egg whites to quiches, scrambled eggs, custards, puddings, pancake batter, or [place] toast wash/batter  Make a rich custard with egg yolks, double strength milk, and sugar  Add extra hard-cooked yolks to deviled egg filling and sandwich spreads  Hard or Semi-Soft Cheese (Cheddar, Ree Kida, [place]) Melt on sandwiches, bread, muffins, tortillas, hamburgers, hot dogs, other meats or fish, vegetables, eggs, or desserts such as stewed figs or pies  Grate and add to soups, sauces, casseroles, vegetable dishes, potatoes, rice noodles, or meatloaf  Serve as a snack with crackers or bagels  Ice cream, Yogurt, and Frozen Yogurt Add to milk drinks such as milkshakes  Add to cereals, fruits, gelatin desserts, and pies  Blend or whip with soft or cooked fruits  Sandwich ice cream or frozen yogurt between enriched cake slices, cookies, or graham crackers  Use seasoned yogurt as a dip for fruits, vegetables, or chips  Use yogurt in  place of sour cream in casseroles  Meat and Fish Add chopped, cooked meat or fish to vegetables, salads, casseroles, soups, sauces, and biscuit dough  Use in omelets, souffls, quiches, and sandwich fillings  Add chicken and [place] to stuffing  Wrap in pie crust or biscuit dough as turnovers  Add to stuffed baked potatoes  Add pureed meat to soups  Milk Use in beverages and in cooking  Use in preparing foods, such as hot cereal, soups, cocoa, or pudding  Add cream sauces to vegetable and other dishes  Use evaporated milk, evaporated skim milk, or sweetened condensed milk instead of milk or water in recipes.  Nonfat Dry Milk Add 1/3 cup of nonfat dry milk powdered milk to each cup of regular milk for "double strength" milk  Add to yogurt and milk drinks, such as pasteurized eggnog and milkshakes  Add to scrambled eggs and mashed potatoes  Use in casseroles, meatloaf, hot cereal, breads, muffins, sauces, cream soups, puddings and custards, and other milk-based desserts  Nuts, Seeds, and Wheat Germ Add to casseroles, breads, muffins, pancakes, cookies, and waffles  Sprinkle on fruit, cereal, ice cream, yogurt, vegetables, salads, and toast as a crunchy topping  Use in place of breadcrumbs  Blend with parsley or spinach, herbs, and cream for a noodle, pasta, or vegetable sauce.  Roll banana in chopped nuts  Peanut Butter Spread on sandwiches, toast, muffins, crackers, waffles, pancakes, and fruit slices  Use as a dip for raw vegetables, such as carrots, cauliflower, and celery  Blend with milk drinks, smoothies, and other beverages  Swirl through soft ice cream or yogurt  Spread on a banana then roll in crushed, dry cereal or chopped nuts   Copyright 2020  [employer]. All rights reserved

## 2022-05-29 NOTE — Telephone Encounter (Signed)
Pharmacy Patient Advocate Encounter  Insurance verification completed.    The patient is insured through [employer]   The patient is currently admitted and ran test claims for the following: [name], Jardiance .  Copays and coinsurance results were relayed to Inpatient clinical team.

## 2022-05-29 NOTE — Progress Notes (Signed)
*  PRELIMINARY RESULTS* Echocardiogram 2D Echocardiogram has been performed.  [name] 05/29/2022, 7:49 AM

## 2022-05-29 NOTE — Consult Note (Signed)
Advanced Heart Failure Team Consult Note   Primary Physician: Laurey Morale, MD PCP-Cardiologist:  None  Reason for Consultation: Acute systolic heart failure/NSTEMI  HPI:    [name] is seen today for evaluation of acute combined systolic and diastolic heart failure at the request of Dr. Fletcher Anon..   [name] is a 66 y.o. 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  He has no h/o HF.   He was recently discharged 05/23/22 after L BKA. A couple days after the procedure he began to experience SOB, orthopnea, PND, and chest pressure. No chest pain just pressure. Decided to come to ED for further evaluation.   In the ED he presented with worsening SOB. HsTrop 803>734, BNP 1547, Cr 1.45 (baseline ~1), WBC 13, resp panel (-). CXR with mild cardiomegaly, pulmonary vascular congestion, and bilateral pleural effusions. Echo showed new EF of 25-30% with anterior/anteroseptal WMA, Mild AS and GIIDD (8/21 echo EF was 55-60%). Was placed on supplemental oxygen and diuresed with IV lasix.   Underwent R thoracentesis with 1.2L off and feels much better. Denies Current CP. Mild SOB.   Cardiac studies reviewed: Echo 12/11: EF 25-30%, LV with regional wall abnormalities. Mild LVH, GIIDD, severe hypokineses of LV. Normal RV systolic function. RVSP ~51.3. LA mildly dilated. Mod pleural effusion. Mod MR, mod mitral annular calcifications, Mod TR. Mild aortic valve regurgitation. Echo 8/21: EF 55-60%, mild LVH, GIDD, RV systolic pressure normal  Home Medications Prior to Admission medications   Medication Sig Start Date End Date Taking? Authorizing Provider  acetaminophen (TYLENOL) 500 MG tablet Take 500 mg by mouth in the morning and at bedtime.   Yes [provider]  ALPRAZolam (XANAX) 0.5 MG tablet TAKE 1 TABLET (0.5 MG TOTAL) BY MOUTH 3 (THREE) TIMES DAILY AS NEEDED FOR ANXIETY OR SLEEP. 05/03/22  Yes Laurey Morale, MD  amLODipine (NORVASC) 10 MG  tablet Take 1 tablet (10 mg total) by mouth daily. 05/24/22  Yes Max Sane, MD  aspirin EC 81 MG tablet Take 1 tablet (81 mg total) by mouth daily. Swallow whole. 05/24/22  Yes Max Sane, MD  clopidogrel (PLAVIX) 75 MG tablet Take 1 tablet (75 mg total) by mouth daily. 05/24/22  Yes Max Sane, MD  doxycycline (VIBRA-TABS) 100 MG tablet Take 1 tablet (100 mg total) by mouth 2 (two) times daily for 10 days. 05/23/22 06/02/22 Yes Max Sane, MD  furosemide (LASIX) 20 MG tablet TAKE 1 TABLET (20 MG TOTAL) BY MOUTH DAILY AS NEEDED FOR FLUID. Patient taking differently: Take 20 mg by mouth daily. 05/03/22  Yes Laurey Morale, MD  ibuprofen (ADVIL) 200 MG tablet Take 200 mg by mouth 2 (two) times daily.   Yes [provider]  insulin isophane & regular human KwikPen (NOVOLIN 70/30 KWIKPEN) (70-30) 100 UNIT/ML KwikPen Inject 25 Units into the skin 2 (two) times daily with a meal. 02/03/22  Yes Laurey Morale, MD  levothyroxine (SYNTHROID) 50 MCG tablet TAKE 1 TABLET BY MOUTH EVERY DAY 05/03/22  Yes Laurey Morale, MD  metoprolol succinate (TOPROL-XL) 25 MG 24 hr tablet Take 1 tablet (25 mg total) by mouth daily. 08/23/21  Yes Laurey Morale, MD  Multiple Vitamin (MULTIVITAMIN) tablet Take 1 tablet by mouth daily.   Yes [provider]  temazepam (RESTORIL) 30 MG capsule TAKE 1 CAPSULE BY MOUTH AT BEDTIME AS NEEDED FOR SLEEP 03/20/22  Yes Laurey Morale, MD  ACCU-CHEK  GUIDE test strip Use to test blood sugars twice daily. 05/20/21   Laurey Morale, MD  Accu-Chek Softclix Lancets lancets daily. 12/31/19   [provider]  Blood Glucose Monitoring Suppl (ACCU-CHEK GUIDE ME) w/Device KIT USE AS DIRECTED EVERY DAY 12/31/19   [provider]  Continuous Blood Gluc Sensor ([place]) MISC See admin instructions. 05/02/22   [provider]  Insulin Pen Needle (BD PEN NEEDLE MICRO U/F) 32G X 6 MM MISC USE AS DIRECTED TO INJECT NOVILIN 05/08/22   Laurey Morale, MD   SANTYL 250 UNIT/GM ointment Apply 1 Application topically daily. Patient not taking: Reported on 05/25/2022 04/27/22   [provider]    Past Medical History: Past Medical History:  Diagnosis Date   Diabetes mellitus    Hematospermia    Hypertension    Nephrolithiasis 02/07/07   Viral meningitis     Past Surgical History: Past Surgical History:  Procedure Laterality Date   AMPUTATION Left 05/19/2022   Procedure: AMPUTATION BELOW KNEE;  Surgeon: Katha Cabal, MD;  Location: [facility] ORS;  Service: Vascular;  Laterality: Left;   CHOLECYSTECTOMY  12-07-14   laparoscopic, per Dr. Rush Farmer    LOWER EXTREMITY ANGIOGRAPHY Left 05/16/2022   Procedure: Lower Extremity Angiography;  Surgeon: Katha Cabal, MD;  Location: [facility];  Service: Cardiovascular;  Laterality: Left;    Family History: Family History  Problem Relation Age of Onset   Rheum arthritis Mother    Diabetes Father    Heart disease Father    Cholecystitis Sister    Cholecystitis Brother    Diabetes Brother    Diabetes Brother    Parkinson's disease Brother    Heart disease Sister    Diabetes Sister    Cholecystitis Sister    Diabetes Sister    Arthritis Other    Hypertension Other    Stroke Other    Coronary artery disease Other    Birth defects Neg Hx     Social History: Social History   Socioeconomic History   Marital status: Married    Spouse name: Not on file   Number of children: 2   Years of education: Not on file   Highest education level: Not on file  Occupational History   Not on file  Tobacco Use   Smoking status: Former   Smokeless tobacco: Never   Tobacco comments:    22 yrs ago   Substance and Sexual Activity   Alcohol use: No    Alcohol/week: 0.0 standard drinks of alcohol   Drug use: No   Sexual activity: Not on file  Other Topics Concern   Not on file  Social History Narrative   Right Handed   Drinks little caffeine - only diet sodas if so   One  Story Home    5 Grandchildren   Social Determinants of Health   Financial Resource Strain: Unknown (01/01/2022)   Overall Financial Resource Strain (CARDIA)    Difficulty of Paying Living Expenses: Patient refused  Food Insecurity: Unknown (05/16/2022)   Hunger Vital Sign    Worried About Running Out of Food in the Last Year: Patient refused    [place] in the Last Year: Patient refused  Transportation Needs: Unknown (05/16/2022)   PRAPARE - Transportation    Lack of Transportation (Medical): Patient refused    Lack of Transportation (Non-Medical): Patient refused  Physical Activity: Unknown (01/01/2022)   Exercise Vital Sign    Days of  Exercise per Week: Patient refused    Minutes of Exercise per Session: Not on file  Stress: Unknown (01/01/2022)   [place] Walk    Feeling of Stress : Patient refused  Social Connections: Unknown (01/01/2022)   Social Connection and Isolation Panel [NHANES]    Frequency of Communication with Friends and Family: Patient refused    Frequency of Social Gatherings with Friends and Family: Patient refused    Attends Religious Services: Patient refused    Marine scientist or Organizations: Patient refused    Attends Music therapist: Not on file    Marital Status: Patient refused    Allergies:  No Known Allergies  Objective:    Vital Signs:   Temp:  [97.6 F (36.4 C)-98.4 F (36.9 C)] 97.9 F (36.6 C) (12/11 0418) Pulse Rate:  [81-98] 87 (12/11 0931) Resp:  [15-22] 19 (12/11 0931) BP: (107-140)/(54-88) 110/67 (12/11 0931) SpO2:  [92 %-100 %] 100 % (12/11 0931) Last BM Date : 05/27/22  Weight change: Filed Weights   05/27/22 1847  Weight: 104.3 kg    Intake/Output:   Intake/Output Summary (Last 24 hours) at 05/29/2022 0957 Last data filed at 05/28/2022 2139 Gross per 24 hour  Intake --  Output 700 ml  Net -700 ml      Physical Exam     General:  Chronically ill-appearing. No resp difficulty HEENT: normal Neck: supple. JVP to jaw Carotids 2+ bilat; no bruits. No lymphadenopathy or thryomegaly appreciated. Cor: PMI nondisplaced. Regular rate & rhythm. No rubs, gallops or murmurs. Lungs: clear Abdomen: soft, nontender, nondistended. No hepatosplenomegaly. No bruits or masses. Good bowel sounds. Extremities: no cyanosis, clubbing, rash, edema s/p L BKA with wrap 3+ edema on R Neuro: alert & orientedx3, cranial nerves grossly intact. moves all 4 extremities w/o difficulty. Affect pleasant    Telemetry   Sinus 80-90s Personally reviewed  EKG    Sinus rhythm 94 bpm Anteroseptal Qs Non-specific ST-T abnormalities (new since previous). Personally reviewed   Labs   Basic Metabolic Panel: Recent Labs  Lab 05/27/22 1846 05/28/22 0505  NA 128* 130*  K 3.6 3.9  CL 99 102  CO2 19* 20*  GLUCOSE 208* 223*  BUN 59* 54*  CREATININE 1.66* 1.55*  CALCIUM 8.3* 8.0*    Liver Function Tests: Recent Labs  Lab 05/27/22 1846 05/28/22 0505  AST 23  --   ALT 24  --   ALKPHOS 95  --   BILITOT 0.8  --   PROT 7.5 7.1  ALBUMIN 2.8*  --    No results for input(s): "LIPASE", "AMYLASE" in the last 168 hours. No results for input(s): "AMMONIA" in the last 168 hours.  CBC: Recent Labs  Lab 05/27/22 1846 05/28/22 0505  WBC 12.9* 13.0*  NEUTROABS 10.0*  --   HGB 9.6* 9.0*  HCT 29.1* 27.5*  MCV 95.4 96.8  PLT 429* 352    Cardiac Enzymes: No results for input(s): "CKTOTAL", "CKMB", "CKMBINDEX", "TROPONINI" in the last 168 hours.  BNP: BNP (last 3 results) Recent Labs    05/27/22 1851  BNP 1,547.0*    ProBNP (last 3 results) No results for input(s): "PROBNP" in the last 8760 hours.   CBG: Recent Labs  Lab 05/28/22 1525 05/28/22 1941 05/28/22 2316 05/29/22 0408 05/29/22 0747  GLUCAP 154* 142* 144* 122* 107*    Coagulation Studies: Recent Labs    05/27/22 1846  LABPROT 14.5  INR 1.1  Imaging   ECHOCARDIOGRAM COMPLETE  Result Date: 05/29/2022    ECHOCARDIOGRAM REPORT   Patient Name:   River Viona Gilmore Ehmann Date of Exam: 05/29/2022 Medical Rec #:  [id number]    Height:       68.0 in Accession #:    [id number]   Weight:       230.0 lb Date of Birth:  [birth year]     BSA:          2.169 m Patient Age:    31 years     BP:           Not listed in chart/Not listed in                                            chart mmHg Patient Gender: M            HR:           Not listed in chart bpm. Exam Location:  [facility] Procedure: 2D Echo, Cardiac Doppler and Color Doppler Indications:     CHF--acute diastolic Y92.44  History:         Patient has prior history of Echocardiogram examinations, most                  recent 02/09/2020. Risk Factors:Hypertension and Diabetes.  Sonographer:     Sherrie Sport Referring Phys:  [id number] Athena Masse Diagnosing Phys: Kathlyn Sacramento MD  Sonographer Comments: Imnage quality was fair. IMPRESSIONS  1. Left ventricular ejection fraction, by estimation, is 25 to 30%. The left ventricle has severely decreased function. The left ventricle demonstrates regional wall motion abnormalities (see scoring diagram/findings for description). There is mild left  ventricular hypertrophy. Left ventricular diastolic parameters are consistent with Grade II diastolic dysfunction (pseudonormalization). There is severe hypokinesis of the left ventricular, mid-apical anteroseptal wall, anterior wall and apical segment.  2. Right ventricular systolic function is normal. The right ventricular size is normal. There is moderately elevated pulmonary artery systolic pressure. The estimated right ventricular systolic pressure is 77.1 mmHg.  3. Left atrial size was mildly dilated.  4. Moderate pleural effusion in the left lateral region.  5. The mitral valve is normal in structure. Moderate mitral valve regurgitation. No evidence of mitral stenosis. Moderate mitral annular calcification.  6. Tricuspid valve  regurgitation is moderate.  7. The aortic valve is calcified. Aortic valve regurgitation is mild. Mild aortic valve stenosis. Suspect at least mild aortic stenosis. An accurate gradient was not obtained.  8. The inferior vena cava is normal in size with <50% respiratory variability, suggesting right atrial pressure of 8 mmHg. Comparison(s): A prior study was performed on 02/09/2020. Prior images reviewed side by side. Changes from prior study are noted. Significant drop in EF with new wall motion abnormalities. FINDINGS  Left Ventricle: Left ventricular ejection fraction, by estimation, is 25 to 30%. The left ventricle has severely decreased function. The left ventricle demonstrates regional wall motion abnormalities. Severe hypokinesis of the left ventricular, mid-apical anteroseptal wall, anterior wall and apical segment. The left ventricular internal cavity size was normal in size. There is mild left ventricular hypertrophy. Left ventricular diastolic parameters are consistent with Grade II diastolic dysfunction (pseudonormalization). Right Ventricle: The right ventricular size is normal. No increase in right ventricular wall thickness. Right ventricular systolic function is normal. There is moderately elevated pulmonary artery systolic pressure. The tricuspid  regurgitant velocity is 3.29 m/s, and with an assumed right atrial pressure of 8 mmHg, the estimated right ventricular systolic pressure is 41.6 mmHg. Left Atrium: Left atrial size was mildly dilated. Right Atrium: Right atrial size was normal in size. Pericardium: There is no evidence of pericardial effusion. Mitral Valve: The mitral valve is normal in structure. Moderate mitral annular calcification. Moderate mitral valve regurgitation. No evidence of mitral valve stenosis. Tricuspid Valve: The tricuspid valve is normal in structure. Tricuspid valve regurgitation is moderate . No evidence of tricuspid stenosis. Aortic Valve: The aortic valve is calcified.  Aortic valve regurgitation is mild. Mild aortic stenosis is present. Aortic valve mean gradient measures 2.0 mmHg. Aortic valve peak gradient measures 4.2 mmHg. Aortic valve area, by VTI measures 2.81 cm. Pulmonic Valve: The pulmonic valve was normal in structure. Pulmonic valve regurgitation is mild. No evidence of pulmonic stenosis. Aorta: The aortic root is normal in size and structure. Venous: The inferior vena cava is normal in size with less than 50% respiratory variability, suggesting right atrial pressure of 8 mmHg. IAS/Shunts: No atrial level shunt detected by color flow Doppler. Additional Comments: There is a moderate pleural effusion in the left lateral region.  LEFT VENTRICLE PLAX 2D LVIDd:         4.90 cm      Diastology LVIDs:         4.00 cm      LV e' medial:    6.85 cm/s LV PW:         1.00 cm      LV E/e' medial:  20.6 LV IVS:        1.20 cm      LV e' lateral:   11.10 cm/s LVOT diam:     2.10 cm      LV E/e' lateral: 12.7 LV SV:         46 LV SV Index:   21 LVOT Area:     3.46 cm  LV Volumes (MOD) LV vol d, MOD A2C: 141.0 ml LV vol d, MOD A4C: 120.0 ml LV vol s, MOD A2C: 101.0 ml LV vol s, MOD A4C: 89.6 ml LV SV MOD A2C:     40.0 ml LV SV MOD A4C:     120.0 ml LV SV MOD BP:      36.4 ml RIGHT VENTRICLE RV Basal diam:  3.00 cm RV Mid diam:    2.70 cm RV S prime:     12.60 cm/s TAPSE (M-mode): 2.0 cm LEFT ATRIUM             Index        RIGHT ATRIUM           Index LA diam:        3.90 cm 1.80 cm/m   RA Area:     11.00 cm LA Vol (A2C):   52.2 ml 24.07 ml/m  RA Volume:   26.90 ml  12.40 ml/m LA Vol (A4C):   41.6 ml 19.18 ml/m LA Biplane Vol: 47.0 ml 21.67 ml/m  AORTIC VALVE AV Area (Vmax):    2.07 cm AV Area (Vmean):   2.53 cm AV Area (VTI):     2.81 cm AV Vmax:           103.00 cm/s AV Vmean:          63.900 cm/s AV VTI:            0.165 m AV Peak Grad:  4.2 mmHg AV Mean Grad:      2.0 mmHg LVOT Vmax:         61.50 cm/s LVOT Vmean:        46.600 cm/s LVOT VTI:          0.134 m  LVOT/AV VTI ratio: 0.81  AORTA Ao Root diam: 3.28 cm MITRAL VALVE                TRICUSPID VALVE MV Area (PHT): 4.68 cm     TR Peak grad:   43.3 mmHg MV Decel Time: 162 msec     TR Vmax:        329.00 cm/s MV E velocity: 141.00 cm/s MV A velocity: 83.30 cm/s   SHUNTS MV E/A ratio:  1.69         Systemic VTI:  0.13 m                             Systemic Diam: 2.10 cm Kathlyn Sacramento MD Electronically signed by Kathlyn Sacramento MD Signature Date/Time: 05/29/2022/9:28:47 AM    Final      Medications:     Current Medications:  aspirin EC  81 mg Oral Daily   clopidogrel  75 mg Oral Daily   furosemide  80 mg Intravenous BID   insulin aspart  0-15 Units Subcutaneous Q4H   insulin detemir  10 Units Subcutaneous BID   levothyroxine  50 mcg Oral Q0600   metoprolol succinate  25 mg Oral Daily    Infusions:  azithromycin Stopped (05/29/22 0021)   cefTRIAXone (ROCEPHIN)  IV Stopped (05/28/22 2232)      Patient Profile   Mr. Hyneman is a 66 y.o. 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 AHF team asked to see for new acute combined systolic and diastolic heart failure.  Assessment/Plan  Acute combined systolic and diastolic heart failure - Echo 12/11: EF 25-30%, LV with regional wall abnormalities. Mild LVH, GIIDD, severe hypokineses of LV. Normal RV systolic function. RVSP ~51.3. LA mildly dilated. Mod pleural effusion. Mod MR, mod mitral annular calcifications, Mod TR. Mild aortic valve regurgitation.  - Presented NYHA class IIIb-IV - Echo and ECG consistent with occluded LAD. Suspect severe underlying diabetic CAD - Volume status remains elevated. Continue lasix 80 IV bid. Add spiro - Continue metoprolol 77m daily - Titrate on ARB/ARNI as AKI settles. SGLT2i prior to d/c.  - Will need L/RHC once better diuresed and AKI improving. Suspect Wed or Thursday Elevated troponin - HsTrop 803>724 on  admission suspect demand ischemia +/- evolving post-op MI - ECG c/w occluded LAD. Timing unclear - Continue ASA and plavix  Acute respiratory failure with hypoxia - 2/2 acute CHF exacerbation - Improving with diuresis and thoracentesis  - stable on 2L [place] CAP - CT chest: with patchy upper lung infiltrates consistent with possible infectious process. Covid (-). WBC 12.9 on admission.  - On ceftriaxone and azithromycin - pending blood cultures and pleural fluid results Bilateral pleural effusions - s/p R thoracentesis - small effusion on left  AKI - Cr. 1.55 yesterday, BMET pending today. Baseline around 1.0 - avoid hypotension DMII - Hgb A1c 6.7 - SSI per primary - Add SGLt2i prior to d/c  Anemia - Hgb 9, stable - w/u per primary  Hypothyroid - on synthroid    Length of Stay: 2  DGlori Bickers MD  4:28 PM  05/29/2022, 9:57 AM  Advanced Heart Failure Team Pager [phone] (M-F; 7a - 5p)  Please contact [facility] for night-coverage after hours (4p -7a ) and weekends on [email or web address]

## 2022-05-29 NOTE — TOC CM/SW Note (Signed)
..   Transition of Care [facility]) Screening Note   Patient Details  Name: [name] Date of Birth: 08/05/1955   Transition of Care [facility]) CM/SW Contact:    Elliot Cousin, RN Phone Number: [phone] 05/29/2022, 5:01 PM    Transition of Care Department [facility]) has reviewed patient. Patient was recent [facility], [facility], and OT  and wheelchair. Will need resumption of care orders for [facility]. Will wait PT/OT recommendations for home. Will continue to follow for dc needs.  Isidoro Donning RN3 CCM, Heart Failure TOC CM [phone]

## 2022-05-29 NOTE — TOC Benefit Eligibility Note (Signed)
Patient Product/process development scientist completed.    The patient is currently admitted and upon discharge could be taking Entresto 24-26 mg.  The current 30 day co-pay is $0.00.   The patient is currently admitted and upon discharge could be taking Farxiga 10 mg.  The current 30 day co-pay is $0.00.   The patient is currently admitted and upon discharge could be taking Jardiance 10 mg.  The current 30 day co-pay is $0.00.   The patient is insured through [employer]   Roland Earl, CPHT Pharmacy Patient Advocate Specialist [facility] Patient Advocate Team Direct Number: [phone]  Fax: [phone]

## 2022-05-29 NOTE — Evaluation (Addendum)
Physical Therapy Evaluation Patient Details Name: [name] MRN: [id number] DOB: [birth year] Today's Date: 05/29/2022  History of Present Illness  66 yo male with acute on chronic CHF. 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 Pt independent with lateral scooting towards the foot of the bed. Sit<>Stand With RW and CGA from very elevated surface at pt request. Pt mod I with sit> supine with HOB elevated. Pt would benefit from skilled physical therapy to address the listed deficits (see below) to increase independence with ADLs and function. Current recommendation is HHPT and frequent assist to return pt to PLOF and increase safety.         Recommendations for follow up therapy are one component of a multi-disciplinary discharge planning process, led by the attending physician.  Recommendations may be updated based on patient status, additional functional criteria and insurance authorization.  Follow Up Recommendations Home health PT      Assistance Recommended at Discharge Frequent or constant Supervision/Assistance  Patient can return home with the following  A little help with walking and/or transfers;A little help with bathing/dressing/bathroom;Assistance with cooking/housework;Assist for transportation;Help with stairs or ramp for entrance    Equipment Recommendations None recommended by PT  Recommendations for Other Services       Functional Status Assessment Patient has had a recent decline in their functional status and demonstrates the ability to make significant improvements in function in a reasonable and predictable amount of time.     Precautions / Restrictions Precautions Precautions: Fall Restrictions Weight Bearing Restrictions: Yes LLE Weight Bearing: Weight bearing as tolerated Other Position/Activity  Restrictions: Full WB'ing R leg; NWB L LE s/p BKA      Mobility  Bed Mobility Overal bed mobility: Modified Independent Bed Mobility: Sit to Supine       Sit to supine: HOB elevated        Transfers Overall transfer level: Needs assistance Equipment used: Rolling walker (2 wheels) Transfers: Sit to/from Stand Sit to Stand: Min guard          Lateral/Scoot Transfers: Independent General transfer comment: lateral scoot down towards the foot of the bed    Ambulation/Gait                  Stairs            Wheelchair Mobility    Modified Rankin (Stroke Patients Only)       Balance Overall balance assessment: Needs assistance Sitting-balance support: Feet supported Sitting balance-Leahy Scale: Good     Standing balance support: Bilateral upper extremity supported, Reliant on assistive device for balance Standing balance-Leahy Scale: Fair                               Pertinent Vitals/Pain Pain Assessment Pain Assessment: No/denies pain    Home Living Family/patient expects to be discharged to:: Private residence Living Arrangements: Spouse/significant other Available Help at Discharge: Family;Available PRN/intermittently Type of Home: House Home Access: Stairs to enter Entrance Stairs-Rails: Doctor, general practice of Steps: 4   Home Layout: One level Home Equipment: Rollator (4 wheels);BSC/3in1;Shower seat - built in;Grab bars - tub/shower;Wheelchair - Forensic psychologist (2 wheels)      Prior Function Prior Level of Function : Needs assist       Physical Assist : Mobility (physical);ADLs (physical) Mobility (  physical): Gait;Stairs ADLs (physical): Grooming;Dressing;Bathing;IADLs         Hand Dominance        Extremity/Trunk Assessment   Upper Extremity Assessment Upper Extremity Assessment: Overall WFL for tasks assessed    Lower Extremity Assessment Lower Extremity Assessment: Generalized  weakness       Communication   Communication: No difficulties  Cognition Arousal/Alertness: Awake/alert Behavior During Therapy: WFL for tasks assessed/performed Overall Cognitive Status: Within Functional Limits for tasks assessed                                          General Comments      Exercises     Assessment/Plan    PT Assessment Patient needs continued PT services  PT Problem List Decreased strength;Decreased range of motion;Decreased activity tolerance;Decreased balance;Decreased mobility;Decreased knowledge of use of DME;Decreased knowledge of precautions;Cardiopulmonary status limiting activity       PT Treatment Interventions DME instruction;Gait training;Stair training;Functional mobility training;Therapeutic activities;Therapeutic exercise;Balance training;Patient/family education;Wheelchair mobility training    PT Goals (Current goals can be found in the Care Plan section)  Acute Rehab PT Goals Patient Stated Goal: to return home PT Goal Formulation: With patient Time For Goal Achievement: 06/03/22 Potential to Achieve Goals: Good    Frequency Min 2X/week     Co-evaluation               AM-PAC PT "6 Clicks" Mobility  Outcome Measure Help needed turning from your back to your side while in a flat bed without using bedrails?: None Help needed moving from lying on your back to sitting on the side of a flat bed without using bedrails?: None Help needed moving to and from a bed to a chair (including a wheelchair)?: A Little Help needed standing up from a chair using your arms (e.g., wheelchair or bedside chair)?: A Little Help needed to walk in hospital room?: Total Help needed climbing 3-5 steps with a railing? : Total 6 Click Score: 16    End of Session   Activity Tolerance: Patient tolerated treatment well Patient left: in bed;Other (comment) (being taken for prodecure)   PT Visit Diagnosis: Unsteadiness on feet  (R26.81);Other abnormalities of gait and mobility (R26.89);Muscle weakness (generalized) (M62.81);Pain    Time: 4196-2229 PT Time Calculation (min) (ACUTE ONLY): 13 min   Charges:             Tresa Endo O'Daniel, SPT  05/29/2022, 9:25 AM

## 2022-05-29 NOTE — ED Notes (Signed)
Report received from Leala, RN  

## 2022-05-29 NOTE — ED Notes (Signed)
Bandaid noted to right posterior chest from thoracentesis today. No redness, swelling or drainage noted from site. Patient denies pain. Patient resting in bed, no distress noted

## 2022-05-29 NOTE — Progress Notes (Signed)
PROGRESS NOTE    [name]  YHC:[id number] DOB: [birth year] DOA: 05/27/2022 PCP: Nelwyn Salisbury, MD   Brief Narrative: [name] is a 66 y.o. male with a history of hypertension, diabetes mellitus type 2, neuropathy, PAD, hypothyroidism gangrene/osteomyelitis s/p recent left BKA. Patient presented secondary to shortness of breath and found to have evidence of acute heart failure with bilateral pleural effusions and acute respiratory failure. Supplementation oxygen provided and Lasix IV started. Cardiology consulted.   Assessment and Plan: * Acute on chronic combined systolic and diastolic CHF (congestive heart failure) (HCC) Possibly related to recent admission and IV fluid administration. Patient with associated large pleural effusions with vascular congestion. BNP of 1,547. Patient started on Lasix IV and cardiology consulted. Transthoracic Echocardiogram obtained and confirms severely reduce LVEF of 25-30% with associated regional wall motion abnormalities and grade 2 diastolic dysfunction. Advanced heart failure team is consulted. Daily weights ordered but there have been none documented since admission. -Continue Lasix IV -Strict in/out and daily weights (standing, if able) -Follow-up cardiology recommendations  Acute respiratory failure with hypoxia (HCC) Appears to be multifactorial. Patient with acute heart failure, pleural effusions and possible pneumonia. CT angio of the chest ruled out acute/chronic PE. Patient requiring 4 L/min of supplemental oxygen on admission and has increased to a peak of 6 L/min. -Wean oxygen to room air; goal SpO2 >92% -Daily ambulatory pulse ox  Bilateral pleural effusion Acute. Likely secondary to acute heart failure. Associated respiratory failure. Lasix diuresis started and thoracentesis ordered on admission. Right thoracentesis completed on 12/11. -Left thoracentesis for 12/12 -Follow-up pleural fluid labs  Demand ischemia Possible  diagnosis. Patient without chest pain or acute ST-T wave changes. Troponin elevated to 803 with delta down to 734. Likely secondary to acute heart failure.  CAP (community acquired pneumonia) CT angio of the chest significant for patchy upper lung infiltrates consistent with possible infectious process. COVID-19 negative. WBC of 12.9 on admission which is stable from prior admission at discharge. Procalcitonin is undetectable. Blood cultures obtained. Patient started on Ceftriaxone and azithromycin empirically. -Continue Ceftriaxone and azithromycin -Follow-up pleural fluid culture data -Follow-up blood cultures  AKI (acute kidney injury) (HCC) Baseline creatinine is about 1. Creatinine of 1.66 on admission which is slightly elevated from creatinine of 1.45 from recent hospital discharge. Likely secondary to fluid overload although patient received Vancomycin and IV contrast at recent hospitalization; IV contrast received this admission for CTA. Lasix IV started. Creatinine down to 1.55 on 12/10. -BMP pending today  Metabolic acidosis Secondary to AKI. Mild. CO2 improved.  Controlled type 2 diabetes mellitus with hyperglycemia, with Mcmains-term current use of insulin (HCC) Patient is managed on insulin 70/30 25 units BID as an outpatient. Most recent hemoglobin A1C of 6.7% 7 days prior which suggests well controlled diabetes. Patient started on Semglee 5 units daily and SSI on admission. -Continue Levemir 10 units BID and continue SSI while inpatient  Anemia Present on admission and improved from most recent prior to admission hemoglobin of 7.8 at 9.6 on admission. Drifted down slightly to 9. No evidence of acute bleeding at this time. Presumed secondary to perioperative blood loss from previous admission.  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 Recommendation during prior admission for inpatient rehabilitation, which the patient declined. -PT/OT during this  admission  Hypothyroidism -Continue Synthroid 25 mg daily    DVT prophylaxis: SCDs Code Status:   Code Status: Full Code Family Communication: None at bedside Disposition Plan: Discharge pending improvement  in fluid status and ability to wean off oxygen   Consultants:  Cardiology  Procedures:  Transthoracic Echocardiogram  Antimicrobials: Ceftriaxone Azithromycin    Subjective: Patient has significant improvement of dyspnea after right thoracentesis. No chest pain.  Objective: BP (!) 116/58   Pulse 94   Temp 97.7 F (36.5 C) (Oral)   Resp 15   Ht 5\' 8"  (1.727 m)   Wt 104.3 kg   SpO2 99%   BMI 34.97 kg/m   Examination:  General exam: Appears calm and comfortable Respiratory system: Clear to auscultation on right with diminished breath sounds at left base. Respiratory effort normal. Cardiovascular system: S1 & S2 heard, Normal rate with regular rhythm. Gastrointestinal system: Abdomen is nondistended, soft and nontender. Normal bowel sounds heard. Central nervous system: Alert and oriented. No focal neurological deficits. Musculoskeletal: RLE edema. No calf tenderness. Left BKA. Skin: No cyanosis. No rashes Psychiatry: Judgement and insight appear normal. Mood & affect appropriate.    Data Reviewed: I have personally reviewed following labs and imaging studies  CBC Lab Results  Component Value Date   WBC 13.0 (H) 05/28/2022   RBC 2.84 (L) 05/28/2022   HGB 9.0 (L) 05/28/2022   HCT 27.5 (L) 05/28/2022   MCV 96.8 05/28/2022   MCH 31.7 05/28/2022   PLT 352 05/28/2022   MCHC 32.7 05/28/2022   RDW 14.2 05/28/2022   LYMPHSABS 1.6 05/27/2022   MONOABS 0.8 05/27/2022   EOSABS 0.2 05/27/2022   BASOSABS 0.1 [id number]     Last metabolic panel Lab Results  Component Value Date   NA 130 (L) 05/28/2022   K 3.9 05/28/2022   CL 102 05/28/2022   CO2 20 (L) 05/28/2022   BUN 54 (H) 05/28/2022   CREATININE 1.55 (H) 05/28/2022   GLUCOSE 223 (H) 05/28/2022    GFRNONAA 50 (L) 05/28/2022   GFRAA >60 01/10/2020   CALCIUM 8.0 (L) 05/28/2022   PHOS 2.7 05/18/2022   PROT 7.1 05/28/2022   ALBUMIN 2.8 (L) 05/27/2022   BILITOT 0.8 05/27/2022   ALKPHOS 95 05/27/2022   AST 23 05/27/2022   ALT 24 05/27/2022   ANIONGAP 8 05/28/2022    GFR: Estimated Creatinine Clearance: 56.4 mL/min (A) (by C-G formula based on SCr of 1.55 mg/dL (H)).  Recent Results (from the past 240 hour(s))  Resp panel by RT-PCR (RSV, Flu A&B, Covid) Anterior Nasal Swab     Status: None   Collection Time: 05/27/22  6:47 PM   Specimen: Anterior Nasal Swab  Result Value Ref Range Status   SARS Coronavirus 2 by RT PCR NEGATIVE NEGATIVE Final    Comment: (NOTE) SARS-CoV-2 target nucleic acids are NOT DETECTED.  The SARS-CoV-2 RNA is generally detectable in upper respiratory specimens during the acute phase of infection. The lowest concentration of SARS-CoV-2 viral copies this assay can detect is 138 copies/mL. A negative result does not preclude SARS-Cov-2 infection and should not be used as the sole basis for treatment or other patient management decisions. A negative result may occur with  improper specimen collection/handling, submission of specimen other than nasopharyngeal swab, presence of viral mutation(s) within the areas targeted by this assay, and inadequate number of viral copies(<138 copies/mL). A negative result must be combined with clinical observations, patient history, and epidemiological information. The expected result is Negative.  Fact Sheet for Patients:  EntrepreneurPulse.com.au  Fact Sheet for Healthcare Providers:  IncredibleEmployment.be  This test is no t yet approved or cleared by the [place] and  has been authorized  for detection and/or diagnosis of SARS-CoV-2 by FDA under an Emergency Use Authorization (EUA). This EUA will remain  in effect (meaning this test can be used) for the duration of  the COVID-19 declaration under Section 564(b)(1) of the Act, 21 U.S.C.section 360bbb-3(b)(1), unless the authorization is terminated  or revoked sooner.       Influenza A by PCR NEGATIVE NEGATIVE Final   Influenza B by PCR NEGATIVE NEGATIVE Final    Comment: (NOTE) The Xpert Xpress SARS-CoV-2/FLU/RSV plus assay is intended as an aid in the diagnosis of influenza from Nasopharyngeal swab specimens and should not be used as a sole basis for treatment. Nasal washings and aspirates are unacceptable for Xpert Xpress SARS-CoV-2/FLU/RSV testing.  Fact Sheet for Patients: EntrepreneurPulse.com.au  Fact Sheet for Healthcare Providers: IncredibleEmployment.be  This test is not yet approved or cleared by the [place] FDA and has been authorized for detection and/or diagnosis of SARS-CoV-2 by FDA under an Emergency Use Authorization (EUA). This EUA will remain in effect (meaning this test can be used) for the duration of the COVID-19 declaration under Section 564(b)(1) of the Act, 21 U.S.C. section 360bbb-3(b)(1), unless the authorization is terminated or revoked.     Resp Syncytial Virus by PCR NEGATIVE NEGATIVE Final    Comment: (NOTE) Fact Sheet for Patients: EntrepreneurPulse.com.au  Fact Sheet for Healthcare Providers: IncredibleEmployment.be  This test is not yet approved or cleared by the [place] FDA and has been authorized for detection and/or diagnosis of SARS-CoV-2 by FDA under an Emergency Use Authorization (EUA). This EUA will remain in effect (meaning this test can be used) for the duration of the COVID-19 declaration under Section 564(b)(1) of the Act, 21 U.S.C. section 360bbb-3(b)(1), unless the authorization is terminated or revoked.  Performed at [facility], [address] [place], [place] [address]   Culture, blood (routine x 2)     Status: None (Preliminary result)    Collection Time: 05/27/22  9:18 PM   Specimen: BLOOD  Result Value Ref Range Status   Specimen Description BLOOD BLOOD LEFT ARM  Final   Special Requests   Final    BOTTLES DRAWN AEROBIC AND ANAEROBIC Blood Culture adequate volume   Culture   Final    NO GROWTH 2 DAYS Performed at [facility], [address] [place], [place] [address]    Report Status PENDING  Incomplete  Culture, blood (routine x 2)     Status: None (Preliminary result)   Collection Time: 05/27/22  9:32 PM   Specimen: BLOOD RIGHT HAND  Result Value Ref Range Status   Specimen Description BLOOD RIGHT HAND  Final   Special Requests   Final    Blood Culture results may not be optimal due to an inadequate volume of blood received in culture bottles   Culture   Final    NO GROWTH 2 DAYS Performed at [facility], [address], [place], [place] [address]    Report Status PENDING  Incomplete      Radiology Studies: DG Chest Port 1 View  Result Date: 05/29/2022 CLINICAL DATA:  Post thoracentesis. EXAM: PORTABLE CHEST 1 VIEW COMPARISON:  Radiographs and CT 05/27/2022. FINDINGS: At 0954 hours. The heart size and mediastinal contours are stable. Right pleural effusion has significantly decreased in volume and there is improved aeration of the right lung base. There is a possible minimal right apical pneumothorax. A moderate size left pleural effusion and associated left basilar airspace disease are unchanged. Mild degenerative changes  in the spine. IMPRESSION: 1. Decreased right pleural effusion following thoracentesis. Possible minimal right apical pneumothorax. 2. Stable moderate size left pleural effusion and associated left basilar airspace disease. Electronically Signed   By: Richardean Sale M.D.   On: 05/29/2022 10:12   ECHOCARDIOGRAM COMPLETE  Result Date: 05/29/2022    ECHOCARDIOGRAM REPORT   Patient Name:   [name] Date of Exam: 05/29/2022 Medical Rec #:  [id number]    Height:       68.0  in Accession #:    [id number]   Weight:       230.0 lb Date of Birth:  [birth year]     BSA:          2.169 m Patient Age:    46 years     BP:           Not listed in chart/Not listed in                                            chart mmHg Patient Gender: M            HR:           Not listed in chart bpm. Exam Location:  [facility] Procedure: 2D Echo, Cardiac Doppler and Color Doppler Indications:     CHF--acute diastolic [id number]  History:         Patient has prior history of Echocardiogram examinations, most                  recent 02/09/2020. Risk Factors:Hypertension and Diabetes.  Sonographer:     Sherrie Sport Referring Phys:  [id number] Athena Masse Diagnosing Phys: Kathlyn Sacramento MD  Sonographer Comments: Imnage quality was fair. IMPRESSIONS  1. Left ventricular ejection fraction, by estimation, is 25 to 30%. The left ventricle has severely decreased function. The left ventricle demonstrates regional wall motion abnormalities (see scoring diagram/findings for description). There is mild left  ventricular hypertrophy. Left ventricular diastolic parameters are consistent with Grade II diastolic dysfunction (pseudonormalization). There is severe hypokinesis of the left ventricular, mid-apical anteroseptal wall, anterior wall and apical segment.  2. Right ventricular systolic function is normal. The right ventricular size is normal. There is moderately elevated pulmonary artery systolic pressure. The estimated right ventricular systolic pressure is Q000111Q mmHg.  3. Left atrial size was mildly dilated.  4. Moderate pleural effusion in the left lateral region.  5. The mitral valve is normal in structure. Moderate mitral valve regurgitation. No evidence of mitral stenosis. Moderate mitral annular calcification.  6. Tricuspid valve regurgitation is moderate.  7. The aortic valve is calcified. Aortic valve regurgitation is mild. Mild aortic valve stenosis. Suspect at least mild aortic stenosis. An accurate gradient was not obtained.   8. The inferior vena cava is normal in size with <50% respiratory variability, suggesting right atrial pressure of 8 mmHg. Comparison(s): A prior study was performed on 02/09/2020. Prior images reviewed side by side. Changes from prior study are noted. Significant drop in EF with new wall motion abnormalities. FINDINGS  Left Ventricle: Left ventricular ejection fraction, by estimation, is 25 to 30%. The left ventricle has severely decreased function. The left ventricle demonstrates regional wall motion abnormalities. Severe hypokinesis of the left ventricular, mid-apical anteroseptal wall, anterior wall and apical segment. The left ventricular internal cavity size was normal in size. There is mild left ventricular hypertrophy.  Left ventricular diastolic parameters are consistent with Grade II diastolic dysfunction (pseudonormalization). Right Ventricle: The right ventricular size is normal. No increase in right ventricular wall thickness. Right ventricular systolic function is normal. There is moderately elevated pulmonary artery systolic pressure. The tricuspid regurgitant velocity is 3.29 m/s, and with an assumed right atrial pressure of 8 mmHg, the estimated right ventricular systolic pressure is Q000111Q mmHg. Left Atrium: Left atrial size was mildly dilated. Right Atrium: Right atrial size was normal in size. Pericardium: There is no evidence of pericardial effusion. Mitral Valve: The mitral valve is normal in structure. Moderate mitral annular calcification. Moderate mitral valve regurgitation. No evidence of mitral valve stenosis. Tricuspid Valve: The tricuspid valve is normal in structure. Tricuspid valve regurgitation is moderate . No evidence of tricuspid stenosis. Aortic Valve: The aortic valve is calcified. Aortic valve regurgitation is mild. Mild aortic stenosis is present. Aortic valve mean gradient measures 2.0 mmHg. Aortic valve peak gradient measures 4.2 mmHg. Aortic valve area, by VTI measures 2.81  cm. Pulmonic Valve: The pulmonic valve was normal in structure. Pulmonic valve regurgitation is mild. No evidence of pulmonic stenosis. Aorta: The aortic root is normal in size and structure. Venous: The inferior vena cava is normal in size with less than 50% respiratory variability, suggesting right atrial pressure of 8 mmHg. IAS/Shunts: No atrial level shunt detected by color flow Doppler. Additional Comments: There is a moderate pleural effusion in the left lateral region.  LEFT VENTRICLE PLAX 2D LVIDd:         4.90 cm      Diastology LVIDs:         4.00 cm      LV e' medial:    6.85 cm/s LV PW:         1.00 cm      LV E/e' medial:  20.6 LV IVS:        1.20 cm      LV e' lateral:   11.10 cm/s LVOT diam:     2.10 cm      LV E/e' lateral: 12.7 LV SV:         46 LV SV Index:   21 LVOT Area:     3.46 cm  LV Volumes (MOD) LV vol d, MOD A2C: 141.0 ml LV vol d, MOD A4C: 120.0 ml LV vol s, MOD A2C: 101.0 ml LV vol s, MOD A4C: 89.6 ml LV SV MOD A2C:     40.0 ml LV SV MOD A4C:     120.0 ml LV SV MOD BP:      36.4 ml RIGHT VENTRICLE RV Basal diam:  3.00 cm RV Mid diam:    2.70 cm RV S prime:     12.60 cm/s TAPSE (M-mode): 2.0 cm LEFT ATRIUM             Index        RIGHT ATRIUM           Index LA diam:        3.90 cm 1.80 cm/m   RA Area:     11.00 cm LA Vol (A2C):   52.2 ml 24.07 ml/m  RA Volume:   26.90 ml  12.40 ml/m LA Vol (A4C):   41.6 ml 19.18 ml/m LA Biplane Vol: 47.0 ml 21.67 ml/m  AORTIC VALVE AV Area (Vmax):    2.07 cm AV Area (Vmean):   2.53 cm AV Area (VTI):     2.81 cm AV Vmax:  103.00 cm/s AV Vmean:          63.900 cm/s AV VTI:            0.165 m AV Peak Grad:      4.2 mmHg AV Mean Grad:      2.0 mmHg LVOT Vmax:         61.50 cm/s LVOT Vmean:        46.600 cm/s LVOT VTI:          0.134 m LVOT/AV VTI ratio: 0.81  AORTA Ao Root diam: 3.28 cm MITRAL VALVE                TRICUSPID VALVE MV Area (PHT): 4.68 cm     TR Peak grad:   43.3 mmHg MV Decel Time: 162 msec     TR Vmax:        329.00 cm/s  MV E velocity: 141.00 cm/s MV A velocity: 83.30 cm/s   SHUNTS MV E/A ratio:  1.69         Systemic VTI:  0.13 m                             Systemic Diam: 2.10 cm Kathlyn Sacramento MD Electronically signed by Kathlyn Sacramento MD Signature Date/Time: 05/29/2022/9:28:47 AM    Final    CT Angio Chest PE W and/or Wo Contrast  Result Date: 05/27/2022 CLINICAL DATA:  SOB EXAM: CT ANGIOGRAPHY CHEST WITH CONTRAST TECHNIQUE: Multidetector CT imaging of the chest was performed using the standard protocol during bolus administration of intravenous contrast. Multiplanar CT image reconstructions and MIPs were obtained to evaluate the vascular anatomy. RADIATION DOSE REDUCTION: This exam was performed according to the departmental dose-optimization program which includes automated exposure control, adjustment of the mA and/or kV according to patient size and/or use of iterative reconstruction technique. CONTRAST:  75 mL OMNIPAQUE IOHEXOL 350 MG/ML SOLN COMPARISON:  None Available. FINDINGS: Cardiovascular: Satisfactory opacification of the pulmonary arteries to the segmental level. No evidence of pulmonary embolism. Normal heart size. No pericardial effusion. Reflux of contrast into the IVC consistent with tricuspid insufficiency. No evidence of aortic aneurysm. Atheromatous changes noted of the aorta. Mediastinum/Nodes: 1.4 cm right hilar node. Few small mediastinal nodes. No mediastinal lesions identified. Lungs/Pleura: Large bilateral pleural effusions. Patchy alveolar process identified in the upper lobes likely an infectious process. Follow-up recommended to confirm resolution. Bibasilar, lingular and right middle lobe consolidation or atelectasis noted. Upper Abdomen: Nodular configuration liver suggests cirrhosis. Cholecystectomy clips. No adrenal lesions are identified. Musculoskeletal: Thoracic degenerative changes noted. Review of the MIP images confirms the above findings. IMPRESSION: 1. No evidence of PE. 2. Large  pleural effusions. 3. Patchy nodular alveolar process upper lungs concerning for an infectious process for which follow up is recommended to confirm resolution. 4. Bibasilar atelectasis/consolidation. 5. Hilar adenopathy likely to be reactive and follow up recommended. 6. Cirrhotic appearance of the liver. Electronically Signed   By: Sammie Bench M.D.   On: 05/27/2022 20:04   DG Chest 2 View  Result Date: 05/27/2022 CLINICAL DATA:  Shortness of breath, postop hypoxia. EXAM: CHEST - 2 VIEW COMPARISON:  Chest radiograph dated FINDINGS: The heart is mildly enlarged. Pulmonary vascular congestion. Low lung volumes. Bibasilar opacities suggesting bilateral pleural effusions and/or atelectasis or infiltrate. IMPRESSION: Mild cardiomegaly with pulmonary vascular congestion. Bibasilar opacities suggesting bilateral pleural effusions and/or atelectasis or infiltrate. Electronically Signed   By: Keane Police D.O.   On:  05/27/2022 19:21      LOS: 2 days    Cordelia Poche, MD [facility] 05/29/2022, 12:18 PM   If 7PM-7AM, please contact night-coverage [email or web address]

## 2022-05-30 ENCOUNTER — Inpatient Hospital Stay: Payer: No Typology Code available for payment source

## 2022-05-30 DIAGNOSIS — J189 Pneumonia, unspecified organism: Secondary | ICD-10-CM

## 2022-05-30 DIAGNOSIS — R7989 Other specified abnormal findings of blood chemistry: Secondary | ICD-10-CM

## 2022-05-30 DIAGNOSIS — I5043 Acute on chronic combined systolic (congestive) and diastolic (congestive) heart failure: Secondary | ICD-10-CM

## 2022-05-30 DIAGNOSIS — J9601 Acute respiratory failure with hypoxia: Secondary | ICD-10-CM | POA: Diagnosis not present

## 2022-05-30 DIAGNOSIS — N179 Acute kidney failure, unspecified: Secondary | ICD-10-CM | POA: Diagnosis not present

## 2022-05-30 DIAGNOSIS — J90 Pleural effusion, not elsewhere classified: Secondary | ICD-10-CM | POA: Diagnosis not present

## 2022-05-30 LAB — BASIC METABOLIC PANEL
Anion gap: 7 (ref 5–15)
BUN: 38 mg/dL — ABNORMAL HIGH (ref 8–23)
CO2: 24 mmol/L (ref 22–32)
Calcium: 7.8 mg/dL — ABNORMAL LOW (ref 8.9–10.3)
Chloride: 108 mmol/L (ref 98–111)
Creatinine, Ser: 1.4 mg/dL — ABNORMAL HIGH (ref 0.61–1.24)
GFR, Estimated: 56 mL/min — ABNORMAL LOW (ref >60)
Glucose, Bld: 85 mg/dL (ref 70–99)
Potassium: 3.8 mmol/L (ref 3.5–5.1)
Sodium: 139 mmol/L (ref 135–145)

## 2022-05-30 LAB — LIPID PANEL
Cholesterol: 93 mg/dL (ref 0–200)
HDL: 28 mg/dL — ABNORMAL LOW (ref >40)
LDL Cholesterol: 51 mg/dL (ref 0–99)
Total CHOL/HDL Ratio: 3.3 RATIO
Triglycerides: 72 mg/dL (ref <150)
VLDL: 14 mg/dL (ref 0–40)

## 2022-05-30 LAB — CBG MONITORING, ED
Glucose-Capillary: 105 mg/dL — ABNORMAL HIGH (ref 70–99)
Glucose-Capillary: 76 mg/dL (ref 70–99)
Glucose-Capillary: 91 mg/dL (ref 70–99)

## 2022-05-30 LAB — GLUCOSE, CAPILLARY
Glucose-Capillary: 136 mg/dL — ABNORMAL HIGH (ref 70–99)
Glucose-Capillary: 144 mg/dL — ABNORMAL HIGH (ref 70–99)
Glucose-Capillary: 168 mg/dL — ABNORMAL HIGH (ref 70–99)

## 2022-05-30 LAB — TSH: TSH: 5.605 u[IU]/mL — ABNORMAL HIGH (ref 0.350–4.500)

## 2022-05-30 MED ORDER — SODIUM CHLORIDE 0.9 % IV SOLN: INTRAVENOUS | Status: DC | PRN

## 2022-05-30 MED ORDER — ATORVASTATIN CALCIUM 20 MG PO TABS
20.0000 mg | ORAL_TABLET | Freq: Every day | ORAL | Status: DC
  Administered 2022-05-31 – 2022-06-04 (×4): 20 mg via ORAL
  Filled 2022-05-30 (×4): qty 1

## 2022-05-30 MED ORDER — INSULIN DETEMIR 100 UNIT/ML ~~LOC~~ SOLN
5.0000 [IU] | Freq: Two times a day (BID) | SUBCUTANEOUS | Status: DC
  Administered 2022-05-30 – 2022-06-04 (×9): 5 [IU] via SUBCUTANEOUS
  Filled 2022-05-30 (×11): qty 0.05

## 2022-05-30 NOTE — Progress Notes (Signed)
PT Cancellation Note  Patient Details Name: [name] MRN: [id number] DOB: [birth year]   Cancelled Treatment:    Reason Eval/Treat Not Completed:  (Treatment session attempted.  Patient sitting indep, just preparing to start breakfast upon arrival to room; requests therapist re-attempt at later time.  Will continue efforts at later time/date as appropriate.)   Keny Donald H. Manson Passey, PT, [name], NCS 05/30/22, 10:24 AM [phone]

## 2022-05-30 NOTE — Consult Note (Signed)
   Heart Failure Nurse Navigator Note  HFrEF 25 to 30%.  Wall motion abnormality noted.  Mild left ventricular hypertrophy.  Moderately elevated pulmonary artery systolic pressures.  Mild left atrial enlargement.  Moderate mitral regurgitation.  Moderate tricuspid regurgitation.  Mild aortic insufficiency.    He presented to the emergency room with a 4-day history of worsening shortness of breath, dyspnea on exertion, orthopnea, PND.  BNP was 1547.  Chest x-ray revealed large pleural effusions. Discharged from the hospital on December 5 after undergoing left BKA.  Comorbidities:  Hypertension Diabetes Peripheral vascular disease Hypothyroidism  Medications:  Aspirin 81 mg daily Plavix 75 mg daily Furosemide 80 mg IV 2 times a day Levothyroxine 50 mcg daily Metoprolol succinate 25 mg daily Spironolactone 12 and half milligrams daily  Labs:  Sodium 139, potassium 3.8, chloride 108, CO2 24, BUN 38, creatinine 1.40 up from 1.29 of yesterday, GFR 56, TSH 5.605, total cholesterol 93, triglycerides 72, HDL 28, LDL 51.  Weight has not been documented since being in the emergency room. Intake 350 mL Output 450 mL  Initial meeting with patient, he was sitting on the edge of the bed eating breakfast.  Discussed heart failure and what it means.  He he states that the doctor had told him that he has had a heart attack.  Discussed  normal function in relationship to his function of 25 to 30%.  States that he lives at home with his wife.  They both work from home.  He works with [employer].  Went over sodium restriction of 2000 mg a day.  He states that he is already started to look up sodium content on  [employer].  He has Lances cheese crackers at the bedside.  Made aware  when he is looking at foods and what to avoid that he should try to keep to keep it to a serving size of 170 mg or less.   Went over fluid restriction, he states hearing knows that he is on 1500 mL and what that  equals.  Also went over daily weights, with his recent BKA does not have a prosthetic yet.  We discussed placing the scale on a hard flat bottom chair and then him sitting on the scale daily after he is gone to the bathroom to empty his bladder with the same amount of clothes on.  Voices understanding.  Went over reporting 2 pound weight gain overnight or 5 pounds in a week along with changes in symptoms such as 3 shortness of breath, PND, orthopnea, and edema.  He voices understanding.  He has a lot of questions about his new diet with diabetes and wanting protein for promoting healing.  I told him that I would contact the dietitian to get help him with his choices.  Made aware of follow-up in the outpatient heart failure clinic at 1:30 PM.  He states that he has an appointment in the vein and vascular office 2:30 that same day.  I explained to him that they are just right down the hall.  He had no further questions.  He was given the living with heart failure teaching booklet, zone magnet, info on low-sodium and heart failure along with weight chart.  Tresa Endo RN [facility]

## 2022-05-30 NOTE — Progress Notes (Signed)
PT Cancellation Note  Patient Details Name: [name] MRN: [id number] DOB: February 15, 1956   Cancelled Treatment:    Reason Eval/Treat Not Completed: Patient declined, no reason specified On arrival pt sitting up at EOB (wife present) and reports he has been doing/looking at [employer].  Discussed HEP and gave tips for course of BKA recovery.  Pt reports he does not wish to try doing a lot of activity right now (happy to sit EOB, not get to recliner), feeling that he does not want to over-do-it regarding his heart with ambulation, etc.  He pleasantly declines formal PT but states he will doe his HEP tonight and that he would like for PT to come by at a later date and maybe he'd feel like doing more then.    Malachi Pro, [name] 05/30/2022, 5:38 PM

## 2022-05-30 NOTE — Progress Notes (Signed)
Brief Nutrition Note  Case discussed with Heart Failure Coordinator, who reports pt is requesting additional education on CHF and diabetes.   Attempted to speak with pt, however, out of room at time of visit. RD attached handouts ("Heart Healthy, Consistent Carbohydrate" and "Tips for Increasing Protein") from [facility] Nutrition Care Manual. Attached to AVS/ discharge summary.   RD also referred to [facility] for further support and education as an outpatient.   RD will try to reattempt to see pt as time allows.   Levada Schilling, RD, LDN, CDCES Registered Dietitian II Certified Diabetes Care and Education Specialist Please refer to [facility] for RD and/or RD on-call/weekend/after hours pager

## 2022-05-30 NOTE — ED Notes (Signed)
Report given to Leala, RN  

## 2022-05-30 NOTE — Progress Notes (Signed)
Patient presents for therapeutic  thoracentesis. [place] limited chest shows small amount of pleural fluid noted. Not enough fluid for therapeutic benefit.  Procedure not performed.

## 2022-05-30 NOTE — Progress Notes (Signed)
PROGRESS NOTE    [name]  [name] DOB: 10-Dec-1955 DOA: 05/27/2022 PCP: Nelwyn Salisbury, MD   Brief Narrative: [name] is a 66 y.o. male with a history of hypertension, diabetes mellitus type 2, neuropathy, PAD, hypothyroidism gangrene/osteomyelitis s/p recent left BKA. Patient presented secondary to shortness of breath and found to have evidence of acute heart failure with bilateral pleural effusions and acute respiratory failure. Supplementation oxygen provided and Lasix IV started. Cardiology consulted. Continued Lasix IV therapy.   Assessment and Plan: * Acute on chronic combined systolic and diastolic CHF (congestive heart failure) (HCC) Possibly related to recent admission and IV fluid administration. Patient with associated large pleural effusions with vascular congestion. BNP of 1,547. Patient started on Lasix IV and cardiology consulted. Transthoracic Echocardiogram obtained and confirms severely reduce LVEF of 25-30% with associated regional wall motion abnormalities and grade 2 diastolic dysfunction. Advanced heart failure team is consulted. Daily weights ordered but still have not been documented after continued requests. -Continue Lasix IV -Strict in/out and daily weights (standing, if able) -Cardiology recommendations: Lasix, metoprolol, spironolactone, plan for R/L heart catheterization -Obtain weight now (discussed with nursing directly)  Acute respiratory failure with hypoxia (HCC) Appears to be multifactorial. Patient with acute heart failure, pleural effusions and possible pneumonia. CT angio of the chest ruled out acute/chronic PE. Patient requiring 4 L/min of supplemental oxygen on admission and has increased to a peak of 6 L/min. -Wean oxygen to room air; goal SpO2 >92% -Daily ambulatory pulse ox  Bilateral pleural effusion Acute. Likely secondary to acute heart failure. Associated respiratory failure. Lasix diuresis started and thoracentesis ordered on  admission. Right thoracentesis completed on 12/11; body fluid consistent with transudate per Light's Criteria. No organisms on gram stain. -Left thoracentesis for 12/12 -Follow-up pleural fluid labs/cytology  Demand ischemia Possible diagnosis. Patient without chest pain or acute ST-T wave changes. Troponin elevated to 803 with delta down to 734. Likely secondary to acute heart failure.  CAP (community acquired pneumonia) CT angio of the chest significant for patchy upper lung infiltrates consistent with possible infectious process. COVID-19 negative. WBC of 12.9 on admission which is stable from prior admission at discharge. Procalcitonin is undetectable. Blood cultures obtained and no growth to date. Patient started on Ceftriaxone and azithromycin empirically. -Continue Ceftriaxone and azithromycin x5 days -Follow-up pleural fluid culture data -Follow-up blood cultures  AKI (acute kidney injury) (HCC) Baseline creatinine is about 1. Creatinine of 1.66 on admission which is slightly elevated from creatinine of 1.45 from recent hospital discharge. Likely secondary to fluid overload although patient received Vancomycin and IV contrast at recent hospitalization; IV contrast received this admission for CTA. Lasix IV started. Creatinine still elevated but stable.  Metabolic acidosis-resolved as of 05/30/2022 Secondary to AKI. Mild. Resolved.  Controlled type 2 diabetes mellitus with hyperglycemia, with Bevens-term current use of insulin (HCC) Patient is managed on insulin 70/30 25 units BID as an outpatient. Most recent hemoglobin A1C of 6.7% 7 days prior which suggests well controlled diabetes. Patient started on Semglee 5 units daily and SSI on admission. Patient reported hypoglycemia on his continuous monitor. CBG down to 76 this morning -Decrease to Levemir 5 mg BID and continue SSI while inpatient  Anemia Present on admission and improved from most recent prior to admission hemoglobin of 7.8  at 9.6 on admission. Drifted down slightly to 9. No evidence of acute bleeding at this time. Presumed secondary to perioperative blood loss from previous admission.  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 Recommendation during prior admission for inpatient rehabilitation, which the patient declined. -PT/OT evaluation pending  Hypothyroidism -Continue Synthroid 25 mg daily    DVT prophylaxis: SCDs Code Status:   Code Status: Full Code Family Communication: None at bedside Disposition Plan: Discharge pending improvement in fluid status and ability to wean off oxygen   Consultants:  Cardiology  Procedures:  Transthoracic Echocardiogram  Antimicrobials: Ceftriaxone Azithromycin    Subjective: Patient has significant improvement of dyspnea after right thoracentesis. No chest pain.  Objective: BP (!) 110/55 (BP Location: Left Arm)   Pulse 88   Temp 97.8 F (36.6 C)   Resp 18   Ht 5\' 8"  (1.727 m)   Wt 104.3 kg   SpO2 100%   BMI 34.97 kg/m   Examination:  General exam: Appears calm and comfortable Respiratory system: Clear on right with diminished left lung base. Respiratory effort normal. Cardiovascular system: S1 & S2 heard, RRR. No murmurs, rubs, gallops or clicks. Gastrointestinal system: Abdomen is nondistended, soft and nontender. Normal bowel sounds heard. Central nervous system: Alert and oriented. No focal neurological deficits. Musculoskeletal: 2+ RLE edema. No calf tenderness. Left BKA Skin: No cyanosis. No rashes Psychiatry: Judgement and insight appear normal. Mood & affect appropriate.   Data Reviewed: I have personally reviewed following labs and imaging studies  CBC Lab Results  Component Value Date   WBC 10.0 05/29/2022   RBC 2.97 (L) 05/29/2022   HGB 9.3 (L) 05/29/2022   HCT 28.5 (L) 05/29/2022   MCV 96.0 05/29/2022   MCH 31.3 05/29/2022   PLT 371 05/29/2022   MCHC 32.6 05/29/2022   RDW 14.9 05/29/2022    LYMPHSABS 1.6 05/27/2022   MONOABS 0.8 05/27/2022   EOSABS 0.2 05/27/2022   BASOSABS 0.1 05/27/2022     Last metabolic panel Lab Results  Component Value Date   NA 139 05/30/2022   K 3.8 05/30/2022   CL 108 05/30/2022   CO2 24 05/30/2022   BUN 38 (H) 05/30/2022   CREATININE 1.40 (H) 05/30/2022   GLUCOSE 85 05/30/2022   GFRNONAA 56 (L) 05/30/2022   GFRAA >60 01/10/2020   CALCIUM 7.8 (L) 05/30/2022   PHOS 2.7 05/18/2022   PROT 7.1 05/28/2022   ALBUMIN 2.8 (L) 05/27/2022   BILITOT 0.8 05/27/2022   ALKPHOS 95 05/27/2022   AST 23 05/27/2022   ALT 24 05/27/2022   ANIONGAP 7 05/30/2022    GFR: Estimated Creatinine Clearance: 62.4 mL/min (A) (by C-G formula based on SCr of 1.4 mg/dL (H)).  Recent Results (from the past 240 hour(s))  Resp panel by RT-PCR (RSV, Flu A&B, Covid) Anterior Nasal Swab     Status: None   Collection Time: 05/27/22  6:47 PM   Specimen: Anterior Nasal Swab  Result Value Ref Range Status   SARS Coronavirus 2 by RT PCR NEGATIVE NEGATIVE Final    Comment: (NOTE) SARS-CoV-2 target nucleic acids are NOT DETECTED.  The SARS-CoV-2 RNA is generally detectable in upper respiratory specimens during the acute phase of infection. The lowest concentration of SARS-CoV-2 viral copies this assay can detect is 138 copies/mL. A negative result does not preclude SARS-Cov-2 infection and should not be used as the sole basis for treatment or other patient management decisions. A negative result may occur with  improper specimen collection/handling, submission of specimen other than nasopharyngeal swab, presence of viral mutation(s) within the areas targeted by this assay, and inadequate number of viral copies(<138 copies/mL). A negative result must be combined with  clinical observations, patient history, and epidemiological information. The expected result is Negative.  Fact Sheet for Patients:  BloggerCourse.com  Fact Sheet for Healthcare  Providers:  SeriousBroker.it  This test is no t yet approved or cleared by the [place] FDA and  has been authorized for detection and/or diagnosis of SARS-CoV-2 by FDA under an Emergency Use Authorization (EUA). This EUA will remain  in effect (meaning this test can be used) for the duration of the COVID-19 declaration under Section 564(b)(1) of the Act, 21 U.S.C.section 360bbb-3(b)(1), unless the authorization is terminated  or revoked sooner.       Influenza A by PCR NEGATIVE NEGATIVE Final   Influenza B by PCR NEGATIVE NEGATIVE Final    Comment: (NOTE) The Xpert Xpress SARS-CoV-2/FLU/RSV plus assay is intended as an aid in the diagnosis of influenza from Nasopharyngeal swab specimens and should not be used as a sole basis for treatment. Nasal washings and aspirates are unacceptable for Xpert Xpress SARS-CoV-2/FLU/RSV testing.  Fact Sheet for Patients: BloggerCourse.com  Fact Sheet for Healthcare Providers: SeriousBroker.it  This test is not yet approved or cleared by the [place] FDA and has been authorized for detection and/or diagnosis of SARS-CoV-2 by FDA under an Emergency Use Authorization (EUA). This EUA will remain in effect (meaning this test can be used) for the duration of the COVID-19 declaration under Section 564(b)(1) of the Act, 21 U.S.C. section 360bbb-3(b)(1), unless the authorization is terminated or revoked.     Resp Syncytial Virus by PCR NEGATIVE NEGATIVE Final    Comment: (NOTE) Fact Sheet for Patients: BloggerCourse.com  Fact Sheet for Healthcare Providers: SeriousBroker.it  This test is not yet approved or cleared by the [place] FDA and has been authorized for detection and/or diagnosis of SARS-CoV-2 by FDA under an Emergency Use Authorization (EUA). This EUA will remain in effect (meaning this test can be  used) for the duration of the COVID-19 declaration under Section 564(b)(1) of the Act, 21 U.S.C. section 360bbb-3(b)(1), unless the authorization is terminated or revoked.  Performed at [facility], [address] [place], [place] [address]   Culture, blood (routine x 2)     Status: None (Preliminary result)   Collection Time: 05/27/22  9:18 PM   Specimen: BLOOD  Result Value Ref Range Status   Specimen Description BLOOD BLOOD LEFT ARM  Final   Special Requests   Final    BOTTLES DRAWN AEROBIC AND ANAEROBIC Blood Culture adequate volume   Culture   Final    NO GROWTH 2 DAYS Performed at [facility], [address], [place], [place] [address]    Report Status PENDING  Incomplete  Culture, blood (routine x 2)     Status: None (Preliminary result)   Collection Time: 05/27/22  9:32 PM   Specimen: BLOOD RIGHT HAND  Result Value Ref Range Status   Specimen Description BLOOD RIGHT HAND  Final   Special Requests   Final    Blood Culture results may not be optimal due to an inadequate volume of blood received in culture bottles   Culture   Final    NO GROWTH 2 DAYS Performed at [facility], [address] [place], [place] [address]    Report Status PENDING  Incomplete  Body fluid culture w Gram Stain     Status: None (Preliminary result)   Collection Time: 05/29/22  9:15 AM   Specimen: PATH Cytology Pleural fluid  Result Value Ref Range Status   Specimen Description  Final    PLEURAL Performed at [facility], [address] [place], [place] [address]    Special Requests   Final    NONE Performed at [facility], [address] [place], [place] [address]    Gram Stain   Final    RARE WBC PRESENT, PREDOMINANTLY PMN NO ORGANISMS SEEN Performed at [facility], [address] [place], [place] [address]    Culture PENDING  Incomplete   Report Status PENDING  Incomplete      Radiology Studies: US THORACENTESIS  ASP PLEURAL SPACE W/IMG GUIDE  Result Date: 05/29/2022 INDICATION: Pleural effusion EXAM: ULTRASOUND GUIDED RIGHT THORACENTESIS MEDICATIONS: 5 cc 1% lidocaine COMPLICATIONS: SIR Level A - No therapy, no consequence. PROCEDURE: An ultrasound guided thoracentesis was thoroughly discussed with the patient and questions answered. The benefits, risks, alternatives and complications were also discussed. The patient understands and wishes to proceed with the procedure. Written consent was obtained. Ultrasound was performed to localize and mark an adequate pocket of fluid in the right chest. The area was then prepped and draped in the normal sterile fashion. 1% Lidocaine was used for local anesthesia. Under ultrasound guidance a 6 Fr Safe-T-Centesis catheter was introduced. Thoracentesis was performed. The catheter was removed and a dressing applied. FINDINGS: A total of approximately 1.2 L of clear yellow fluid was removed. Samples were sent to the laboratory as requested by the clinical team. IMPRESSION: Successful ultrasound guided right thoracentesis yielding 1.2 L of pleural fluid. Follow-up chest x-ray revealed "possible minimal right apical pneumothorax." Serial chest x-ray revealed no change after one hour. Read by: Mina Marble, PA-C Electronically Signed   By: Olive Bass M.D.   On: 05/29/2022 14:40   DG Chest Port 1 View  Result Date: 05/29/2022 CLINICAL DATA:  Follow-up pleural effusion. Status post right thoracentesis. EXAM: PORTABLE CHEST 1 VIEW COMPARISON:  Prior today FINDINGS: Heart size is stable. Tiny right pleural effusion is again seen. Probable tiny less than 5% right apical pneumothorax is again noted. Small left pleural effusion and left lower lobe atelectasis or consolidation show no significant change. Diffuse pulmonary interstitial prominence is suspicious for interstitial edema. IMPRESSION: Tiny right pleural effusion and probable tiny less than 5% right apical pneumothorax, without  significant change. Stable small left pleural effusion and left lower lobe atelectasis or consolidation. Stable diffuse interstitial infiltrates, suspicious for edema. Electronically Signed   By: Danae Orleans M.D.   On: 05/29/2022 11:18   DG Chest Port 1 View  Result Date: 05/29/2022 CLINICAL DATA:  Post thoracentesis. EXAM: PORTABLE CHEST 1 VIEW COMPARISON:  Radiographs and CT 05/27/2022. FINDINGS: At 0954 hours. The heart size and mediastinal contours are stable. Right pleural effusion has significantly decreased in volume and there is improved aeration of the right lung base. There is a possible minimal right apical pneumothorax. A moderate size left pleural effusion and associated left basilar airspace disease are unchanged. Mild degenerative changes in the spine. IMPRESSION: 1. Decreased right pleural effusion following thoracentesis. Possible minimal right apical pneumothorax. 2. Stable moderate size left pleural effusion and associated left basilar airspace disease. Electronically Signed   By: Carey Bullocks M.D.   On: 05/29/2022 10:12   ECHOCARDIOGRAM COMPLETE  Result Date: 05/29/2022    ECHOCARDIOGRAM REPORT   Patient Name:   YOLTZIN BARG Vanmetre Date of Exam: 05/29/2022 Medical Rec #:  [id number]    Height:       68.0 in Accession #:    [id number]   Weight:  230.0 lb Date of Birth:  07-14-1955     BSA:          2.169 m Patient Age:    66 years     BP:           Not listed in chart/Not listed in                                            chart mmHg Patient Gender: M            HR:           Not listed in chart bpm. Exam Location:  [facility] Procedure: 2D Echo, Cardiac Doppler and Color Doppler Indications:     CHF--acute diastolic I50.31  History:         Patient has prior history of Echocardiogram examinations, most                  recent 02/09/2020. Risk Factors:Hypertension and Diabetes.  Sonographer:     Cristela Blue Referring Phys:  [id number] Andris Baumann Diagnosing Phys: Lorine Bears MD  Sonographer  Comments: Imnage quality was fair. IMPRESSIONS  1. Left ventricular ejection fraction, by estimation, is 25 to 30%. The left ventricle has severely decreased function. The left ventricle demonstrates regional wall motion abnormalities (see scoring diagram/findings for description). There is mild left  ventricular hypertrophy. Left ventricular diastolic parameters are consistent with Grade II diastolic dysfunction (pseudonormalization). There is severe hypokinesis of the left ventricular, mid-apical anteroseptal wall, anterior wall and apical segment.  2. Right ventricular systolic function is normal. The right ventricular size is normal. There is moderately elevated pulmonary artery systolic pressure. The estimated right ventricular systolic pressure is 51.3 mmHg.  3. Left atrial size was mildly dilated.  4. Moderate pleural effusion in the left lateral region.  5. The mitral valve is normal in structure. Moderate mitral valve regurgitation. No evidence of mitral stenosis. Moderate mitral annular calcification.  6. Tricuspid valve regurgitation is moderate.  7. The aortic valve is calcified. Aortic valve regurgitation is mild. Mild aortic valve stenosis. Suspect at least mild aortic stenosis. An accurate gradient was not obtained.  8. The inferior vena cava is normal in size with <50% respiratory variability, suggesting right atrial pressure of 8 mmHg. Comparison(s): A prior study was performed on 02/09/2020. Prior images reviewed side by side. Changes from prior study are noted. Significant drop in EF with new wall motion abnormalities. FINDINGS  Left Ventricle: Left ventricular ejection fraction, by estimation, is 25 to 30%. The left ventricle has severely decreased function. The left ventricle demonstrates regional wall motion abnormalities. Severe hypokinesis of the left ventricular, mid-apical anteroseptal wall, anterior wall and apical segment. The left ventricular internal cavity size was normal in size. There  is mild left ventricular hypertrophy. Left ventricular diastolic parameters are consistent with Grade II diastolic dysfunction (pseudonormalization). Right Ventricle: The right ventricular size is normal. No increase in right ventricular wall thickness. Right ventricular systolic function is normal. There is moderately elevated pulmonary artery systolic pressure. The tricuspid regurgitant velocity is 3.29 m/s, and with an assumed right atrial pressure of 8 mmHg, the estimated right ventricular systolic pressure is 51.3 mmHg. Left Atrium: Left atrial size was mildly dilated. Right Atrium: Right atrial size was normal in size. Pericardium: There is no evidence of pericardial effusion. Mitral Valve: The mitral valve is normal in structure.  Moderate mitral annular calcification. Moderate mitral valve regurgitation. No evidence of mitral valve stenosis. Tricuspid Valve: The tricuspid valve is normal in structure. Tricuspid valve regurgitation is moderate . No evidence of tricuspid stenosis. Aortic Valve: The aortic valve is calcified. Aortic valve regurgitation is mild. Mild aortic stenosis is present. Aortic valve mean gradient measures 2.0 mmHg. Aortic valve peak gradient measures 4.2 mmHg. Aortic valve area, by VTI measures 2.81 cm. Pulmonic Valve: The pulmonic valve was normal in structure. Pulmonic valve regurgitation is mild. No evidence of pulmonic stenosis. Aorta: The aortic root is normal in size and structure. Venous: The inferior vena cava is normal in size with less than 50% respiratory variability, suggesting right atrial pressure of 8 mmHg. IAS/Shunts: No atrial level shunt detected by color flow Doppler. Additional Comments: There is a moderate pleural effusion in the left lateral region.  LEFT VENTRICLE PLAX 2D LVIDd:         4.90 cm      Diastology LVIDs:         4.00 cm      LV e' medial:    6.85 cm/s LV PW:         1.00 cm      LV E/e' medial:  20.6 LV IVS:        1.20 cm      LV e' lateral:   11.10  cm/s LVOT diam:     2.10 cm      LV E/e' lateral: 12.7 LV SV:         46 LV SV Index:   21 LVOT Area:     3.46 cm  LV Volumes (MOD) LV vol d, MOD A2C: 141.0 ml LV vol d, MOD A4C: 120.0 ml LV vol s, MOD A2C: 101.0 ml LV vol s, MOD A4C: 89.6 ml LV SV MOD A2C:     40.0 ml LV SV MOD A4C:     120.0 ml LV SV MOD BP:      36.4 ml RIGHT VENTRICLE RV Basal diam:  3.00 cm RV Mid diam:    2.70 cm RV S prime:     12.60 cm/s TAPSE (M-mode): 2.0 cm LEFT ATRIUM             Index        RIGHT ATRIUM           Index LA diam:        3.90 cm 1.80 cm/m   RA Area:     11.00 cm LA Vol (A2C):   52.2 ml 24.07 ml/m  RA Volume:   26.90 ml  12.40 ml/m LA Vol (A4C):   41.6 ml 19.18 ml/m LA Biplane Vol: 47.0 ml 21.67 ml/m  AORTIC VALVE AV Area (Vmax):    2.07 cm AV Area (Vmean):   2.53 cm AV Area (VTI):     2.81 cm AV Vmax:           103.00 cm/s AV Vmean:          63.900 cm/s AV VTI:            0.165 m AV Peak Grad:      4.2 mmHg AV Mean Grad:      2.0 mmHg LVOT Vmax:         61.50 cm/s LVOT Vmean:        46.600 cm/s LVOT VTI:          0.134 m LVOT/AV VTI ratio: 0.81  AORTA Ao Root diam:  3.28 cm MITRAL VALVE                TRICUSPID VALVE MV Area (PHT): 4.68 cm     TR Peak grad:   43.3 mmHg MV Decel Time: 162 msec     TR Vmax:        329.00 cm/s MV E velocity: 141.00 cm/s MV A velocity: 83.30 cm/s   SHUNTS MV E/A ratio:  1.69         Systemic VTI:  0.13 m                             Systemic Diam: 2.10 cm Lorine Bears MD Electronically signed by Lorine Bears MD Signature Date/Time: 05/29/2022/9:28:47 AM    Final       LOS: 3 days    Jacquelin Hawking, MD [facility] 05/30/2022, 1:05 PM   If 7PM-7AM, please contact night-coverage [email or web address]

## 2022-05-30 NOTE — Progress Notes (Signed)
Rounding Note    Patient Name: [name] Date of Encounter: 05/30/2022  [place] Cardiologist: Ida Rogue, MD   Subjective   UOP -477mL, however suspect this is not accurate. Patient feels breathing is better. Still with LLE on exam. Plan for left thoracentesis today.  Inpatient Medications    Scheduled Meds:  aspirin EC  81 mg Oral Daily   clopidogrel  75 mg Oral Daily   furosemide  80 mg Intravenous BID   insulin aspart  0-15 Units Subcutaneous Q4H   insulin detemir  10 Units Subcutaneous BID   levothyroxine  50 mcg Oral Q0600   metoprolol succinate  25 mg Oral Daily   spironolactone  12.5 mg Oral Daily   Continuous Infusions:  azithromycin Stopped (05/30/22 0038)   cefTRIAXone (ROCEPHIN)  IV Stopped (05/29/22 2326)   PRN Meds: acetaminophen **OR** acetaminophen, albuterol, ALPRAZolam, alum & mag hydroxide-simeth, HYDROcodone-acetaminophen, morphine injection, ondansetron **OR** ondansetron (ZOFRAN) IV   Vital Signs    Vitals:   05/30/22 0530 05/30/22 0600 05/30/22 0630 05/30/22 0844  BP: (!) 116/58 114/60 107/62 122/63  Pulse: 86 84 90 88  Resp: 20 19 19 18   Temp:    98.3 F (36.8 C)  TempSrc:      SpO2: 96% 96% 95% 100%  Weight:      Height:        Intake/Output Summary (Last 24 hours) at 05/30/2022 0944 Last data filed at 05/30/2022 0038 Gross per 24 hour  Intake 350 ml  Output 450 ml  Net -100 ml      05/27/2022    6:47 PM 05/20/2022    4:14 AM 05/19/2022   11:21 AM  Last 3 Weights  Weight (lbs) 230 lb 222 lb 7.1 oz 230 lb  Weight (kg) 104.327 kg 100.9 kg 104.327 kg      Telemetry    Nsr HR 80s - Personally Reviewed  ECG    No new - Personally Reviewed  Physical Exam   GEN: No acute distress.   Neck: No JVD Cardiac: RRR, no murmurs, rubs, or gallops.  Respiratory: diminished at bases bilaterally. GI: Soft, nontender, non-distended  MS: + lower leg edema; L BKA Neuro:  Nonfocal  Psych: Normal affect   Labs     High Sensitivity Troponin:   Recent Labs  Lab 05/27/22 1846 05/27/22 2118  TROPONINIHS 803* 734*     Chemistry Recent Labs  Lab 05/27/22 1846 05/28/22 0505 05/29/22 1829 05/30/22 0517  NA 128* 130* 136 139  K 3.6 3.9 3.7 3.8  CL 99 102 104 108  CO2 19* 20* 22 24  GLUCOSE 208* 223* 145* 85  BUN 59* 54* 41* 38*  CREATININE 1.66* 1.55* 1.29* 1.40*  CALCIUM 8.3* 8.0* 8.0* 7.8*  PROT 7.5 7.1  --   --   ALBUMIN 2.8*  --   --   --   AST 23  --   --   --   ALT 24  --   --   --   ALKPHOS 95  --   --   --   BILITOT 0.8  --   --   --   GFRNONAA 46* 50* >60 56*  ANIONGAP 10 8 10 7     Lipids  Recent Labs  Lab 05/30/22 0517  CHOL 93  TRIG 72  HDL 28*  LDLCALC 51  CHOLHDL 3.3    Hematology Recent Labs  Lab 05/27/22 1846 05/28/22 0505 05/29/22 1829  WBC 12.9* 13.0*  10.0  RBC 3.05* 2.84* 2.97*  HGB 9.6* 9.0* 9.3*  HCT 29.1* 27.5* 28.5*  MCV 95.4 96.8 96.0  MCH 31.5 31.7 31.3  MCHC 33.0 32.7 32.6  RDW 14.2 14.2 14.9  PLT 429* 352 371   Thyroid  Recent Labs  Lab 05/30/22 0517  TSH 5.605*    BNP Recent Labs  Lab 05/27/22 1851  BNP 1,547.0*    DDimer No results for input(s): "DDIMER" in the last 168 hours.   Radiology    US THORACENTESIS ASP PLEURAL SPACE W/IMG GUIDE  Result Date: 05/29/2022 INDICATION: Pleural effusion EXAM: ULTRASOUND GUIDED RIGHT THORACENTESIS MEDICATIONS: 5 cc 1% lidocaine COMPLICATIONS: SIR Level A - No therapy, no consequence. PROCEDURE: An ultrasound guided thoracentesis was thoroughly discussed with the patient and questions answered. The benefits, risks, alternatives and complications were also discussed. The patient understands and wishes to proceed with the procedure. Written consent was obtained. Ultrasound was performed to localize and mark an adequate pocket of fluid in the right chest. The area was then prepped and draped in the normal sterile fashion. 1% Lidocaine was used for local anesthesia. Under ultrasound guidance a 6 Fr  Safe-T-Centesis catheter was introduced. Thoracentesis was performed. The catheter was removed and a dressing applied. FINDINGS: A total of approximately 1.2 L of clear yellow fluid was removed. Samples were sent to the laboratory as requested by the clinical team. IMPRESSION: Successful ultrasound guided right thoracentesis yielding 1.2 L of pleural fluid. Follow-up chest x-ray revealed "possible minimal right apical pneumothorax." Serial chest x-ray revealed no change after one hour. Read by: Mina Marble, PA-C Electronically Signed   By: Olive Bass M.D.   On: 05/29/2022 14:40   DG Chest Port 1 View  Result Date: 05/29/2022 CLINICAL DATA:  Follow-up pleural effusion. Status post right thoracentesis. EXAM: PORTABLE CHEST 1 VIEW COMPARISON:  Prior today FINDINGS: Heart size is stable. Tiny right pleural effusion is again seen. Probable tiny less than 5% right apical pneumothorax is again noted. Small left pleural effusion and left lower lobe atelectasis or consolidation show no significant change. Diffuse pulmonary interstitial prominence is suspicious for interstitial edema. IMPRESSION: Tiny right pleural effusion and probable tiny less than 5% right apical pneumothorax, without significant change. Stable small left pleural effusion and left lower lobe atelectasis or consolidation. Stable diffuse interstitial infiltrates, suspicious for edema. Electronically Signed   By: Danae Orleans M.D.   On: 05/29/2022 11:18   DG Chest Port 1 View  Result Date: 05/29/2022 CLINICAL DATA:  Post thoracentesis. EXAM: PORTABLE CHEST 1 VIEW COMPARISON:  Radiographs and CT 05/27/2022. FINDINGS: At 0954 hours. The heart size and mediastinal contours are stable. Right pleural effusion has significantly decreased in volume and there is improved aeration of the right lung base. There is a possible minimal right apical pneumothorax. A moderate size left pleural effusion and associated left basilar airspace disease are  unchanged. Mild degenerative changes in the spine. IMPRESSION: 1. Decreased right pleural effusion following thoracentesis. Possible minimal right apical pneumothorax. 2. Stable moderate size left pleural effusion and associated left basilar airspace disease. Electronically Signed   By: Carey Bullocks M.D.   On: 05/29/2022 10:12   ECHOCARDIOGRAM COMPLETE  Result Date: 05/29/2022    ECHOCARDIOGRAM REPORT   Patient Name:   [name] Date of Exam: 05/29/2022 Medical Rec #:  [id number]    Height:       68.0 in Accession #:    [id number]   Weight:       230.0  lb Date of Birth:  04-01-1956     BSA:          2.169 m Patient Age:    66 years     BP:           Not listed in chart/Not listed in                                            chart mmHg Patient Gender: M            HR:           Not listed in chart bpm. Exam Location:  [facility] Procedure: 2D Echo, Cardiac Doppler and Color Doppler Indications:     CHF--acute diastolic [id number]  History:         Patient has prior history of Echocardiogram examinations, most                  recent 02/09/2020. Risk Factors:Hypertension and Diabetes.  Sonographer:     Sherrie Sport Referring Phys:  [id number] Athena Masse Diagnosing Phys: Kathlyn Sacramento MD  Sonographer Comments: Imnage quality was fair. IMPRESSIONS  1. Left ventricular ejection fraction, by estimation, is 25 to 30%. The left ventricle has severely decreased function. The left ventricle demonstrates regional wall motion abnormalities (see scoring diagram/findings for description). There is mild left  ventricular hypertrophy. Left ventricular diastolic parameters are consistent with Grade II diastolic dysfunction (pseudonormalization). There is severe hypokinesis of the left ventricular, mid-apical anteroseptal wall, anterior wall and apical segment.  2. Right ventricular systolic function is normal. The right ventricular size is normal. There is moderately elevated pulmonary artery systolic pressure. The estimated right  ventricular systolic pressure is Q000111Q mmHg.  3. Left atrial size was mildly dilated.  4. Moderate pleural effusion in the left lateral region.  5. The mitral valve is normal in structure. Moderate mitral valve regurgitation. No evidence of mitral stenosis. Moderate mitral annular calcification.  6. Tricuspid valve regurgitation is moderate.  7. The aortic valve is calcified. Aortic valve regurgitation is mild. Mild aortic valve stenosis. Suspect at least mild aortic stenosis. An accurate gradient was not obtained.  8. The inferior vena cava is normal in size with <50% respiratory variability, suggesting right atrial pressure of 8 mmHg. Comparison(s): A prior study was performed on 02/09/2020. Prior images reviewed side by side. Changes from prior study are noted. Significant drop in EF with new wall motion abnormalities. FINDINGS  Left Ventricle: Left ventricular ejection fraction, by estimation, is 25 to 30%. The left ventricle has severely decreased function. The left ventricle demonstrates regional wall motion abnormalities. Severe hypokinesis of the left ventricular, mid-apical anteroseptal wall, anterior wall and apical segment. The left ventricular internal cavity size was normal in size. There is mild left ventricular hypertrophy. Left ventricular diastolic parameters are consistent with Grade II diastolic dysfunction (pseudonormalization). Right Ventricle: The right ventricular size is normal. No increase in right ventricular wall thickness. Right ventricular systolic function is normal. There is moderately elevated pulmonary artery systolic pressure. The tricuspid regurgitant velocity is 3.29 m/s, and with an assumed right atrial pressure of 8 mmHg, the estimated right ventricular systolic pressure is Q000111Q mmHg. Left Atrium: Left atrial size was mildly dilated. Right Atrium: Right atrial size was normal in size. Pericardium: There is no evidence of pericardial effusion. Mitral Valve: The mitral valve is  normal in structure. Moderate  mitral annular calcification. Moderate mitral valve regurgitation. No evidence of mitral valve stenosis. Tricuspid Valve: The tricuspid valve is normal in structure. Tricuspid valve regurgitation is moderate . No evidence of tricuspid stenosis. Aortic Valve: The aortic valve is calcified. Aortic valve regurgitation is mild. Mild aortic stenosis is present. Aortic valve mean gradient measures 2.0 mmHg. Aortic valve peak gradient measures 4.2 mmHg. Aortic valve area, by VTI measures 2.81 cm. Pulmonic Valve: The pulmonic valve was normal in structure. Pulmonic valve regurgitation is mild. No evidence of pulmonic stenosis. Aorta: The aortic root is normal in size and structure. Venous: The inferior vena cava is normal in size with less than 50% respiratory variability, suggesting right atrial pressure of 8 mmHg. IAS/Shunts: No atrial level shunt detected by color flow Doppler. Additional Comments: There is a moderate pleural effusion in the left lateral region.  LEFT VENTRICLE PLAX 2D LVIDd:         4.90 cm      Diastology LVIDs:         4.00 cm      LV e' medial:    6.85 cm/s LV PW:         1.00 cm      LV E/e' medial:  20.6 LV IVS:        1.20 cm      LV e' lateral:   11.10 cm/s LVOT diam:     2.10 cm      LV E/e' lateral: 12.7 LV SV:         46 LV SV Index:   21 LVOT Area:     3.46 cm  LV Volumes (MOD) LV vol d, MOD A2C: 141.0 ml LV vol d, MOD A4C: 120.0 ml LV vol s, MOD A2C: 101.0 ml LV vol s, MOD A4C: 89.6 ml LV SV MOD A2C:     40.0 ml LV SV MOD A4C:     120.0 ml LV SV MOD BP:      36.4 ml RIGHT VENTRICLE RV Basal diam:  3.00 cm RV Mid diam:    2.70 cm RV S prime:     12.60 cm/s TAPSE (M-mode): 2.0 cm LEFT ATRIUM             Index        RIGHT ATRIUM           Index LA diam:        3.90 cm 1.80 cm/m   RA Area:     11.00 cm LA Vol (A2C):   52.2 ml 24.07 ml/m  RA Volume:   26.90 ml  12.40 ml/m LA Vol (A4C):   41.6 ml 19.18 ml/m LA Biplane Vol: 47.0 ml 21.67 ml/m  AORTIC VALVE AV  Area (Vmax):    2.07 cm AV Area (Vmean):   2.53 cm AV Area (VTI):     2.81 cm AV Vmax:           103.00 cm/s AV Vmean:          63.900 cm/s AV VTI:            0.165 m AV Peak Grad:      4.2 mmHg AV Mean Grad:      2.0 mmHg LVOT Vmax:         61.50 cm/s LVOT Vmean:        46.600 cm/s LVOT VTI:          0.134 m LVOT/AV VTI ratio: 0.81  AORTA Ao Root diam: 3.28  cm MITRAL VALVE                TRICUSPID VALVE MV Area (PHT): 4.68 cm     TR Peak grad:   43.3 mmHg MV Decel Time: 162 msec     TR Vmax:        329.00 cm/s MV E velocity: 141.00 cm/s MV A velocity: 83.30 cm/s   SHUNTS MV E/A ratio:  1.69         Systemic VTI:  0.13 m                             Systemic Diam: 2.10 cm Kathlyn Sacramento MD Electronically signed by Kathlyn Sacramento MD Signature Date/Time: 05/29/2022/9:28:47 AM    Final     Cardiac Studies   Echo 05/29/22  1. Left ventricular ejection fraction, by estimation, is 25 to 30%. The  left ventricle has severely decreased function. The left ventricle  demonstrates regional wall motion abnormalities (see scoring  diagram/findings for description). There is mild left   ventricular hypertrophy. Left ventricular diastolic parameters are  consistent with Grade II diastolic dysfunction (pseudonormalization).  There is severe hypokinesis of the left ventricular, mid-apical  anteroseptal wall, anterior wall and apical segment.   2. Right ventricular systolic function is normal. The right ventricular  size is normal. There is moderately elevated pulmonary artery systolic  pressure. The estimated right ventricular systolic pressure is Q000111Q mmHg.   3. Left atrial size was mildly dilated.   4. Moderate pleural effusion in the left lateral region.   5. The mitral valve is normal in structure. Moderate mitral valve  regurgitation. No evidence of mitral stenosis. Moderate mitral annular  calcification.   6. Tricuspid valve regurgitation is moderate.   7. The aortic valve is calcified. Aortic valve  regurgitation is mild.  Mild aortic valve stenosis. Suspect at least mild aortic stenosis. An  accurate gradient was not obtained.   8. The inferior vena cava is normal in size with <50% respiratory  variability, suggesting right atrial pressure of 8 mmHg.   Echo 01/2020  1. Left ventricular ejection fraction, by estimation, is 55 to 60%. The  left ventricle has normal function. The left ventricle has no regional  wall motion abnormalities. There is mild left ventricular hypertrophy.  Left ventricular diastolic parameters  are consistent with Grade I diastolic dysfunction (impaired relaxation).   2. Right ventricular systolic function is normal. The right ventricular  size is normal. Tricuspid regurgitation signal is inadequate for assessing  PA pressure.   3. The mitral valve is normal in structure. No evidence of mitral valve  regurgitation. No evidence of mitral stenosis.   4. The aortic valve is normal in structure. Aortic valve regurgitation is  not visualized. Mild to moderate aortic valve sclerosis/calcification is  present, without any evidence of aortic stenosis.   5. The inferior vena cava is normal in size with greater than 50%  respiratory variability, suggesting right atrial pressure of 3 mmHg.   Patient Profile     66 y.o. 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   Assessment & Plan    Acute combined systolic and diastolic heart failure - Echo this admission showed LVEF 25-30%, mild LVH, G2DD, HK of LV, normal RV - prior EF was 55-60% in 2021 - suspect ICM -  IV lasix 80mg  Bid - Net -1L>may be closer to 2L - spironolactone 12.5mg  daily - metoprolol 25mg  daily - kidney function stable - continue with diuresis, once euvolemic plan for R/L heart cath  Elevated troponin - HS trop [id number] - suspect supply demand mismatch - no plan for IV  heparin - continue ASA and plavix  Acute respiratory failure with hypoxia - 2/2 acute CHF exacerbation - improving with diuresis - s/p R thoracentesis R - plan for thoracentesis on the left today  CAP - CT chest with patchy upper infiltrates consistent with possible infectious process - WNC 12.9 on admission - abx per IM  Bilateral pleural effusions - s/p R thoracentesis with 1.2L off - L thoracentesis today  AKI - Scr 1.55 - Baseline around 1 - Scr stable today  DM2 - A1C 6.7  Anemia - Hgb 9 which is stable  For questions or updates, please contact [place] Please consult [email or web address] for contact info under        Signed, Fallon Howerter Ninfa Meeker, PA-C  05/30/2022, 9:44 AM

## 2022-05-31 ENCOUNTER — Inpatient Hospital Stay: Payer: No Typology Code available for payment source | Admitting: Family Medicine

## 2022-05-31 DIAGNOSIS — N179 Acute kidney failure, unspecified: Secondary | ICD-10-CM | POA: Diagnosis not present

## 2022-05-31 DIAGNOSIS — J9601 Acute respiratory failure with hypoxia: Secondary | ICD-10-CM | POA: Diagnosis not present

## 2022-05-31 DIAGNOSIS — I5043 Acute on chronic combined systolic (congestive) and diastolic (congestive) heart failure: Secondary | ICD-10-CM | POA: Diagnosis not present

## 2022-05-31 DIAGNOSIS — I5020 Unspecified systolic (congestive) heart failure: Secondary | ICD-10-CM

## 2022-05-31 DIAGNOSIS — I2489 Other forms of acute ischemic heart disease: Secondary | ICD-10-CM

## 2022-05-31 DIAGNOSIS — D649 Anemia, unspecified: Secondary | ICD-10-CM

## 2022-05-31 LAB — GLUCOSE, CAPILLARY
Glucose-Capillary: 107 mg/dL — ABNORMAL HIGH (ref 70–99)
Glucose-Capillary: 111 mg/dL — ABNORMAL HIGH (ref 70–99)
Glucose-Capillary: 134 mg/dL — ABNORMAL HIGH (ref 70–99)
Glucose-Capillary: 145 mg/dL — ABNORMAL HIGH (ref 70–99)
Glucose-Capillary: 159 mg/dL — ABNORMAL HIGH (ref 70–99)
Glucose-Capillary: 175 mg/dL — ABNORMAL HIGH (ref 70–99)
Glucose-Capillary: 175 mg/dL — ABNORMAL HIGH (ref 70–99)

## 2022-05-31 LAB — CBC
HCT: 27.2 % — ABNORMAL LOW (ref 39.0–52.0)
Hemoglobin: 8.5 g/dL — ABNORMAL LOW (ref 13.0–17.0)
MCH: 30.5 pg (ref 26.0–34.0)
MCHC: 31.3 g/dL (ref 30.0–36.0)
MCV: 97.5 fL (ref 80.0–100.0)
Platelets: 320 10*3/uL (ref 150–400)
RBC: 2.79 MIL/uL — ABNORMAL LOW (ref 4.22–5.81)
RDW: 15 % (ref 11.5–15.5)
WBC: 8 10*3/uL (ref 4.0–10.5)
nRBC: 0 % (ref 0.0–0.2)

## 2022-05-31 LAB — BASIC METABOLIC PANEL
Anion gap: 7 (ref 5–15)
BUN: 37 mg/dL — ABNORMAL HIGH (ref 8–23)
CO2: 25 mmol/L (ref 22–32)
Calcium: 7.6 mg/dL — ABNORMAL LOW (ref 8.9–10.3)
Chloride: 106 mmol/L (ref 98–111)
Creatinine, Ser: 1.44 mg/dL — ABNORMAL HIGH (ref 0.61–1.24)
GFR, Estimated: 54 mL/min — ABNORMAL LOW (ref >60)
Glucose, Bld: 144 mg/dL — ABNORMAL HIGH (ref 70–99)
Potassium: 3.4 mmol/L — ABNORMAL LOW (ref 3.5–5.1)
Sodium: 138 mmol/L (ref 135–145)

## 2022-05-31 LAB — CYTOLOGY - NON PAP

## 2022-05-31 MED ORDER — SODIUM CHLORIDE 0.9% FLUSH
3.0000 mL | Freq: Two times a day (BID) | INTRAVENOUS | Status: DC
  Administered 2022-05-31 – 2022-06-03 (×5): 3 mL via INTRAVENOUS

## 2022-05-31 MED ORDER — FUROSEMIDE 10 MG/ML IJ SOLN
40.0000 mg | Freq: Two times a day (BID) | INTRAMUSCULAR | Status: DC
  Administered 2022-06-01: 40 mg via INTRAVENOUS
  Filled 2022-05-31: qty 4

## 2022-05-31 MED ORDER — POTASSIUM CHLORIDE 20 MEQ PO PACK
40.0000 meq | PACK | Freq: Once | ORAL | Status: AC
  Administered 2022-05-31: 40 meq via ORAL
  Filled 2022-05-31: qty 2

## 2022-05-31 NOTE — Progress Notes (Signed)
PROGRESS NOTE    [name]  ZOX:[id number] DOB: 08/04/1955 DOA: 05/27/2022 PCP: Nelwyn SalisburyFry, Stephen A, MD   Brief Narrative: [name] is a 66 y.o. male with a history of hypertension, diabetes mellitus type 2, neuropathy, PAD, hypothyroidism, gangrene/osteomyelitis s/p recent left BKA presented to hospital with stroke alert.  Patient was noted to have acute heart failure with bilateral pleural effusion acute hypoxemic respiratory failure.  Patient was given supplemental oxygen and IV Lasix.  Cardiology was consulted.  At this time patient is getting IV diuretics for decompensated heart failure.   Assessment and Plan: * Acute on chronic combined systolic and diastolic CHF (congestive heart failure) Patient presented bilateral pleural effusion, vascular congestion with elevated BNP  of 1,547.  Cardiology on [name].  2D echocardiogram shows LVEF of 25-30% with associated regional wall motion abnormalities and grade 2 diastolic dysfunction.  Continue IV Lasix, daily intake and output charting Daily weights.  Cardiology recommends Lasix metoprolol spironolactone at this time.  Plan for right heart catheterization by cardiology on 12/14/ 2023.  As per cardiology renal dysfunction and blood pressure marginally low preventing additional ARB.  Acute respiratory failure with hypoxia  Likely multifactorial from heart failure, pleural effusion and possible pneumonia.  CTA of the chest ruled out pulmonary embolism.  Patient was initially on supplemental oxygen which has been weaned down at this time.  Bilateral pleural effusion Likely secondary to heart failure.  Status post right thoracocentesis 12/11 with transudative fluid.  Small pleural effusion on the left so no tapping was done.  On IV Lasix which has been reduced to 40 twice daily.  Demand ischemia Elevated troponin but no EKG changes or chest pain.    CAP (community acquired pneumonia) CT angio of the chest significant for patchy upper lung  infiltrates consistent with possible infectious process. COVID-19 negative.  Patient did have mild leukocytosis on presentation with undetectable procalcitonin.  Blood cultures negative so far.  On Rocephin and Zithromax empirically to continue for 5 days.  Pleural fluid without any organisms.  Blood cultures negative in 4 days.  AKI (acute kidney injury) (HCC) Baseline creatinine is about 1. Creatinine of 1.66 on admission which is slightly elevated from creatinine of 1.45 from recent hospital discharge. Likely secondary to fluid overload although patient received Vancomycin and IV contrast at recent hospitalization; IV contrast received this admission for CTA. Lasix IV started. Creatinine still elevated but stable.  Metabolic acidosis-resolved   Mild acute kidney injury.  Improved.  Will continue to monitor BMP.  Mild hypokalemia.  Will replace.  On spironolactone.  Will closely monitor  Controlled type 2 diabetes mellitus with hyperglycemia, with Hanken-term current use of insulin (HCC) Patient is on insulin 70/30 25 units BID as an outpatient.  Hemoglobin A1c 7 days prior to presentation at 6.7.  Currently on Levemir and sliding scale insulin.  Anemia Likely from perioperative blood loss from recent surgery.  Latest hemoglobin of 9.3.  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  Physical therapy has seen the patient today and recommend home PT on discharge.  Hypothyroidism -Continue Synthroid   DVT prophylaxis: SCDs  Code Status:   Code Status: Full Code  Family Communication: None at bedside  Disposition Plan:  Likely home with home health in 1 to 2 days.  Pending right heart cath 06/01/2022.  Consultants:  Cardiology  Procedures:  Transthoracic Echocardiogram  Antimicrobials: Ceftriaxone Azithromycin    Subjective: Today, patient was  seen and examined at bedside.  Patient is stated that he  had some heartburn but was able to sleep some in the nighttime.  Denied any overt dyspnea or chest pain fever or chills.  Objective: BP (!) 115/58 (BP Location: Left Arm)   Pulse 85   Temp 97.8 F (36.6 C)   Resp 18   Ht 5\' 8"  (1.727 m)   Wt 96.6 kg   SpO2 100%   BMI 32.38 kg/m   Physical examination:  General:  Average built, not in obvious distress HENT:   No scleral pallor or icterus noted. Oral mucosa is moist.  Chest:   Diminished breath sounds bilaterally.  CVS: S1 &S2 heard. Abdomen: Soft, nontender, nondistended.  Bowel sounds are heard.   Extremities: Right lower extremity with pitting edema.  Left below-knee amputation. Psych: Alert, awake and oriented, normal mood CNS:  No cranial nerve deficits.  Power equal in all extremities.   Skin: Warm and dry.    Data Reviewed: I have personally reviewed following labs and imaging studies  CBC Lab Results  Component Value Date   WBC 10.0 05/29/2022   RBC 2.97 (L) 05/29/2022   HGB 9.3 (L) 05/29/2022   HCT 28.5 (L) 05/29/2022   MCV 96.0 05/29/2022   MCH 31.3 05/29/2022   PLT 371 05/29/2022   MCHC 32.6 05/29/2022   RDW 14.9 05/29/2022   LYMPHSABS 1.6 05/27/2022   MONOABS 0.8 05/27/2022   EOSABS 0.2 05/27/2022   BASOSABS 0.1 05/27/2022     Last metabolic panel Lab Results  Component Value Date   NA 138 05/31/2022   K 3.4 (L) 05/31/2022   CL 106 05/31/2022   CO2 25 05/31/2022   BUN 37 (H) 05/31/2022   CREATININE 1.44 (H) 05/31/2022   GLUCOSE 144 (H) 05/31/2022   GFRNONAA 54 (L) 05/31/2022   GFRAA >60 01/10/2020   CALCIUM 7.6 (L) 05/31/2022   PHOS 2.7 05/18/2022   PROT 7.1 05/28/2022   ALBUMIN 2.8 (L) 05/27/2022   BILITOT 0.8 05/27/2022   ALKPHOS 95 05/27/2022   AST 23 05/27/2022   ALT 24 05/27/2022   ANIONGAP 7 05/31/2022    GFR: Estimated Creatinine Clearance: 58.4 mL/min (A) (by C-G formula based on SCr of 1.44 mg/dL (H)).  Recent Results (from the past 240 hour(s))  Resp panel by RT-PCR (RSV,  Flu A&B, Covid) Anterior Nasal Swab     Status: None   Collection Time: 05/27/22  6:47 PM   Specimen: Anterior Nasal Swab  Result Value Ref Range Status   SARS Coronavirus 2 by RT PCR NEGATIVE NEGATIVE Final    Comment: (NOTE) SARS-CoV-2 target nucleic acids are NOT DETECTED.  The SARS-CoV-2 RNA is generally detectable in upper respiratory specimens during the acute phase of infection. The lowest concentration of SARS-CoV-2 viral copies this assay can detect is 138 copies/mL. A negative result does not preclude SARS-Cov-2 infection and should not be used as the sole basis for treatment or other patient management decisions. A negative result may occur with  improper specimen collection/handling, submission of specimen other than nasopharyngeal swab, presence of viral mutation(s) within the areas targeted by this assay, and inadequate number of viral copies(<138 copies/mL). A negative result must be combined with clinical observations, patient history, and epidemiological information. The expected result is Negative.  Fact Sheet for Patients:  14/09/23  Fact Sheet for Healthcare Providers:  BloggerCourse.com  This test is no t yet approved or cleared by the [employer] and  has been authorized  for detection and/or diagnosis of SARS-CoV-2 by FDA under an Emergency Use Authorization (EUA). This EUA will remain  in effect (meaning this test can be used) for the duration of the COVID-19 declaration under Section 564(b)(1) of the Act, 21 U.S.C.section 360bbb-3(b)(1), unless the authorization is terminated  or revoked sooner.       Influenza A by PCR NEGATIVE NEGATIVE Final   Influenza B by PCR NEGATIVE NEGATIVE Final    Comment: (NOTE) The Xpert Xpress SARS-CoV-2/FLU/RSV plus assay is intended as an aid in the diagnosis of influenza from Nasopharyngeal swab specimens and should not be used as a sole basis for  treatment. Nasal washings and aspirates are unacceptable for Xpert Xpress SARS-CoV-2/FLU/RSV testing.  Fact Sheet for Patients: BloggerCourse.com  Fact Sheet for Healthcare Providers: [employer]  This test is not yet approved or cleared by the [place] FDA and has been authorized for detection and/or diagnosis of SARS-CoV-2 by FDA under an Emergency Use Authorization (EUA). This EUA will remain in effect (meaning this test can be used) for the duration of the COVID-19 declaration under Section 564(b)(1) of the Act, 21 U.S.C. section 360bbb-3(b)(1), unless the authorization is terminated or revoked.     Resp Syncytial Virus by PCR NEGATIVE NEGATIVE Final    Comment: (NOTE) Fact Sheet for Patients: BloggerCourse.com  Fact Sheet for Healthcare Providers: [employer]  This test is not yet approved or cleared by the [place] FDA and has been authorized for detection and/or diagnosis of SARS-CoV-2 by FDA under an Emergency Use Authorization (EUA). This EUA will remain in effect (meaning this test can be used) for the duration of the COVID-19 declaration under Section 564(b)(1) of the Act, 21 U.S.C. section 360bbb-3(b)(1), unless the authorization is terminated or revoked.  Performed at [facility], [address] [place], [place] [address]   Culture, blood (routine x 2)     Status: None (Preliminary result)   Collection Time: 05/27/22  9:18 PM   Specimen: BLOOD  Result Value Ref Range Status   Specimen Description BLOOD BLOOD LEFT ARM  Final   Special Requests   Final    BOTTLES DRAWN AEROBIC AND ANAEROBIC Blood Culture adequate volume   Culture   Final    NO GROWTH 4 DAYS Performed at [facility], [address] [place], [place] [address]    Report Status PENDING  Incomplete  Culture, blood (routine x 2)     Status: None  (Preliminary result)   Collection Time: 05/27/22  9:32 PM   Specimen: BLOOD RIGHT HAND  Result Value Ref Range Status   Specimen Description BLOOD RIGHT HAND  Final   Special Requests   Final    Blood Culture results may not be optimal due to an inadequate volume of blood received in culture bottles   Culture   Final    NO GROWTH 4 DAYS Performed at [facility], [address] [place], [place] [address]    Report Status PENDING  Incomplete  Body fluid culture w Gram Stain     Status: None (Preliminary result)   Collection Time: 05/29/22  9:15 AM   Specimen: PATH Cytology Pleural fluid  Result Value Ref Range Status   Specimen Description   Final    PLEURAL Performed at [facility], [address], [place], [place] [address]    Special Requests   Final    NONE Performed at [facility], [address], [place], [place] [address]    Gram  Stain   Final    RARE WBC PRESENT, PREDOMINANTLY PMN NO ORGANISMS SEEN    Culture   Final    NO GROWTH 2 DAYS Performed at [facility], [address] [place], [place] [address]    Report Status PENDING  Incomplete      Radiology Studies: [place] CHEST (PLEURAL EFFUSION)  Result Date: 05/30/2022 CLINICAL DATA:  LEFT pleural effusion EXAM: LIMITED CHEST ULTRASOUND COMPARISON:  Chest XR, 05/29/2022. IR ultrasound, 05/29/2022. CTA chest, 05/27/2022. FINDINGS: Informed written consent was obtained from the the patient and/or patient's representative after a discussion of the risks, benefits and alternatives to treatment. A timeout was performed prior to the initiation of the procedure. Focused ultrasound along the LEFT chest demonstrates a small volume pleural effusion. The patient denied shortness of breath and given a small effusion thoracentesis was aborted. Procedure performed by Anders Grant, IR NP IMPRESSION: Small volume LEFT pleural effusion. Planned thoracentesis was aborted. Roanna Banning, MD  [facility] Radiology Electronically Signed   By: Roanna Banning M.D.   On: 05/30/2022 15:58      LOS: 4 days    Joycelyn Das, MD [facility] 05/31/2022, 2:30 PM  If 7PM-7AM, please contact night-coverage [email or web address]

## 2022-05-31 NOTE — Progress Notes (Signed)
Physical Therapy Treatment Patient Details Name: [name] MRN: [id number] DOB: 03-11-1956 Today's Date: 05/31/2022   History of Present Illness 66 yo male with acute on chronic CHF. PMH of hypertension, diabetes, peripheral vascular disease, hypothyroidism and a recent left below the knee amputation secondary to gangrene    PT Comments    Pt found supine in bed upon PT entry with c/o mobility. Pt reported that he "had a heart attack" and that he should avoid PT at this time due to the "strenuous activity" on his heart. MD notified of pt concerns and need for further education. Pt heavily educated on the role of and various progressions/levels of physical therapy and benefits. After max encouragement, pt agreed to bed level exercises which he tolerated well. Pt expressed concerns about "not trusting the walker" and was educated on [employer] and safety. Pt would benefit from skilled physical therapy to address the listed deficits (see below) to increase independence with ADLs and function. Current recommendation remains appropriate.    Recommendations for follow up therapy are one component of a multi-disciplinary discharge planning process, led by the attending physician.  Recommendations may be updated based on patient status, additional functional criteria and insurance authorization.  Follow Up Recommendations  Home health PT     Assistance Recommended at Discharge Frequent or constant Supervision/Assistance  Patient can return home with the following A little help with walking and/or transfers;A little help with bathing/dressing/bathroom;Assistance with cooking/housework;Assist for transportation;Help with stairs or ramp for entrance   Equipment Recommendations  None recommended by PT    Recommendations for Other Services       Precautions / Restrictions Precautions Precautions: Fall Restrictions Weight Bearing Restrictions: Yes LLE Weight Bearing: Weight bearing as  tolerated Other Position/Activity Restrictions: Full WB'ing R leg; NWB L LE s/p BKA     Mobility  Bed Mobility                    Transfers                        Ambulation/Gait                   Stairs             Wheelchair Mobility    Modified Rankin (Stroke Patients Only)       Balance                                            Cognition Arousal/Alertness: Awake/alert Behavior During Therapy: WFL for tasks assessed/performed Overall Cognitive Status: Within Functional Limits for tasks assessed                                          Exercises General Exercises - Lower Extremity Gluteal Sets: AROM, Both, 5 reps, Supine Heel Slides: AROM, Left, Supine, 10 reps Hip ABduction/ADduction: AROM, Left, 5 reps, Supine Straight Leg Raises: AROM, Left, 10 reps, Supine    General Comments        Pertinent Vitals/Pain Pain Assessment Pain Assessment: No/denies pain    Home Living                          Prior  Function            PT Goals (current goals can now be found in the care plan section) Acute Rehab PT Goals Patient Stated Goal: to return home PT Goal Formulation: With patient Time For Goal Achievement: 06/03/22 Potential to Achieve Goals: Good Progress towards PT goals: Progressing toward goals    Frequency    Min 2X/week      PT Plan Current plan remains appropriate    Co-evaluation              AM-PAC PT "6 Clicks" Mobility   Outcome Measure  Help needed turning from your back to your side while in a flat bed without using bedrails?: None Help needed moving from lying on your back to sitting on the side of a flat bed without using bedrails?: None Help needed moving to and from a bed to a chair (including a wheelchair)?: A Little Help needed standing up from a chair using your arms (e.g., wheelchair or bedside chair)?: A Little Help needed to walk in  hospital room?: Total Help needed climbing 3-5 steps with a railing? : Total 6 Click Score: 16    End of Session   Activity Tolerance: Patient tolerated treatment well Patient left: in bed Nurse Communication: Other (comment) (MD notified of mobility anxiety and misunderstanding of deficits) PT Visit Diagnosis: Unsteadiness on feet (R26.81);Other abnormalities of gait and mobility (R26.89);Muscle weakness (generalized) (M62.81);Pain     Time: 7939-0300 PT Time Calculation (min) (ACUTE ONLY): 10 min  Charges:                       Tresa Endo O'Daniel, SPT  05/31/2022, 10:00 AM

## 2022-05-31 NOTE — Progress Notes (Addendum)
Rounding Note    Patient Name: [name] Date of Encounter: 05/31/2022  [facility] Cardiologist: Julien Nordmann, MD   Subjective   UOP - . Kidney function mildly worse. Patient reports breathing is better. Patient did not require L thoracentesis. Cardiac cath discussed.   Inpatient Medications    Scheduled Meds:  aspirin EC  81 mg Oral Daily   atorvastatin  20 mg Oral Daily   clopidogrel  75 mg Oral Daily   furosemide  80 mg Intravenous BID   insulin aspart  0-15 Units Subcutaneous Q4H   insulin detemir  5 Units Subcutaneous BID   levothyroxine  50 mcg Oral Q0600   metoprolol succinate  25 mg Oral Daily   spironolactone  12.5 mg Oral Daily   Continuous Infusions:  sodium chloride 10 mL/hr at 05/30/22 2235   azithromycin 500 mg (05/30/22 2318)   cefTRIAXone (ROCEPHIN)  IV 2 g (05/30/22 2236)   PRN Meds: sodium chloride, acetaminophen **OR** acetaminophen, albuterol, ALPRAZolam, alum & mag hydroxide-simeth, HYDROcodone-acetaminophen, morphine injection, ondansetron **OR** ondansetron (ZOFRAN) IV   Vital Signs    Vitals:   05/31/22 0042 05/31/22 0554 05/31/22 0610 05/31/22 0820  BP: (!) 103/51  109/60 (!) 108/52  Pulse: 86  85 85  Resp: 20   14  Temp: 98 F (36.7 C)  97.7 F (36.5 C) 98.2 F (36.8 C)  TempSrc:   Oral   SpO2: 100%  97% 100%  Weight:  96.6 kg    Height:        Intake/Output Summary (Last 24 hours) at 05/31/2022 0830 Last data filed at 05/30/2022 1819 Gross per 24 hour  Intake --  Output 900 ml  Net -900 ml      05/31/2022    5:54 AM 05/30/2022    3:43 PM 05/27/2022    6:47 PM  Last 3 Weights  Weight (lbs) 212 lb 15.4 oz 213 lb 10 oz 230 lb  Weight (kg) 96.6 kg 96.9 kg 104.327 kg      Telemetry    SR HR 90s - Personally Reviewed  ECG    No new - Personally Reviewed  Physical Exam   GEN: No acute distress.   Neck: No JVD Cardiac: RRR, no murmurs, rubs, or gallops.  Respiratory: Clear to auscultation  bilaterally. GI: Soft, nontender, non-distended  MS: 2+ lower leg edema; No deformity. Neuro:  Nonfocal  Psych: Normal affect   Labs    High Sensitivity Troponin:   Recent Labs  Lab 05/27/22 1846 05/27/22 2118  TROPONINIHS 803* 734*     Chemistry Recent Labs  Lab 05/27/22 1846 05/28/22 0505 05/29/22 1829 05/30/22 0517 05/31/22 0315  NA 128* 130* 136 139 138  K 3.6 3.9 3.7 3.8 3.4*  CL 99 102 104 108 106  CO2 19* 20* 22 24 25   GLUCOSE 208* 223* 145* 85 144*  BUN 59* 54* 41* 38* 37*  CREATININE 1.66* 1.55* 1.29* 1.40* 1.44*  CALCIUM 8.3* 8.0* 8.0* 7.8* 7.6*  PROT 7.5 7.1  --   --   --   ALBUMIN 2.8*  --   --   --   --   AST 23  --   --   --   --   ALT 24  --   --   --   --   ALKPHOS 95  --   --   --   --   BILITOT 0.8  --   --   --   --  GFRNONAA 46* 50* >60 56* 54*  ANIONGAP 10 8 10 7 7     Lipids  Recent Labs  Lab 05/30/22 0517  CHOL 93  TRIG 72  HDL 28*  LDLCALC 51  CHOLHDL 3.3    Hematology Recent Labs  Lab 05/27/22 1846 05/28/22 0505 05/29/22 1829  WBC 12.9* 13.0* 10.0  RBC 3.05* 2.84* 2.97*  HGB 9.6* 9.0* 9.3*  HCT 29.1* 27.5* 28.5*  MCV 95.4 96.8 96.0  MCH 31.5 31.7 31.3  MCHC 33.0 32.7 32.6  RDW 14.2 14.2 14.9  PLT 429* 352 371   Thyroid  Recent Labs  Lab 05/30/22 0517  TSH 5.605*    BNP Recent Labs  Lab 05/27/22 1851  BNP 1,547.0*    DDimer No results for input(s): "DDIMER" in the last 168 hours.   Radiology    US CHEST (PLEURAL EFFUSION)  Result Date: 05/30/2022 CLINICAL DATA:  LEFT pleural effusion EXAM: LIMITED CHEST ULTRASOUND COMPARISON:  Chest XR, 05/29/2022. IR ultrasound, 05/29/2022. CTA chest, 05/27/2022. FINDINGS: Informed written consent was obtained from the the patient and/or patient's representative after a discussion of the risks, benefits and alternatives to treatment. A timeout was performed prior to the initiation of the procedure. Focused ultrasound along the LEFT chest demonstrates a small volume pleural  effusion. The patient denied shortness of breath and given a small effusion thoracentesis was aborted. Procedure performed by Anders GrantJennifer Omohundro, IR NP IMPRESSION: Small volume LEFT pleural effusion. Planned thoracentesis was aborted. Roanna BanningJon Mugweru, MD [facility] Electronically Signed   By: Roanna BanningJon  Mugweru M.D.   On: 05/30/2022 15:58   US THORACENTESIS ASP PLEURAL SPACE W/IMG GUIDE  Result Date: 05/29/2022 INDICATION: Pleural effusion EXAM: ULTRASOUND GUIDED RIGHT THORACENTESIS MEDICATIONS: 5 cc 1% lidocaine COMPLICATIONS: SIR Level A - No therapy, no consequence. PROCEDURE: An ultrasound guided thoracentesis was thoroughly discussed with the patient and questions answered. The benefits, risks, alternatives and complications were also discussed. The patient understands and wishes to proceed with the procedure. Written consent was obtained. Ultrasound was performed to localize and mark an adequate pocket of fluid in the right chest. The area was then prepped and draped in the normal sterile fashion. 1% Lidocaine was used for local anesthesia. Under ultrasound guidance a 6 Fr Safe-T-Centesis catheter was introduced. Thoracentesis was performed. The catheter was removed and a dressing applied. FINDINGS: A total of approximately 1.2 L of clear yellow fluid was removed. Samples were sent to the laboratory as requested by the clinical team. IMPRESSION: Successful ultrasound guided right thoracentesis yielding 1.2 L of pleural fluid. Follow-up chest x-ray revealed "possible minimal right apical pneumothorax." Serial chest x-ray revealed no change after one hour. Read by: Mina MarbleMatthew Segal, PA-C Electronically Signed   By: Olive BassYasser  El-Abd M.D.   On: 05/29/2022 14:40   DG Chest Port 1 View  Result Date: 05/29/2022 CLINICAL DATA:  Follow-up pleural effusion. Status post right thoracentesis. EXAM: PORTABLE CHEST 1 VIEW COMPARISON:  Prior today FINDINGS: Heart size is  stable. Tiny right pleural effusion is again seen. Probable tiny less than 5% right apical pneumothorax is again noted. Small left pleural effusion and left lower lobe atelectasis or consolidation show no significant change. Diffuse pulmonary interstitial prominence is suspicious for interstitial edema. IMPRESSION: Tiny right pleural effusion and probable tiny less than 5% right apical pneumothorax, without significant change. Stable small left pleural effusion and left lower lobe atelectasis or consolidation. Stable diffuse interstitial infiltrates, suspicious for edema. Electronically Signed   By: Danae OrleansJohn A Stahl  M.D.   On: 05/29/2022 11:18   DG Chest Port 1 View  Result Date: 05/29/2022 CLINICAL DATA:  Post thoracentesis. EXAM: PORTABLE CHEST 1 VIEW COMPARISON:  Radiographs and CT 05/27/2022. FINDINGS: At 0954 hours. The heart size and mediastinal contours are stable. Right pleural effusion has significantly decreased in volume and there is improved aeration of the right lung base. There is a possible minimal right apical pneumothorax. A moderate size left pleural effusion and associated left basilar airspace disease are unchanged. Mild degenerative changes in the spine. IMPRESSION: 1. Decreased right pleural effusion following thoracentesis. Possible minimal right apical pneumothorax. 2. Stable moderate size left pleural effusion and associated left basilar airspace disease. Electronically Signed   By: Carey Bullocks M.D.   On: 05/29/2022 10:12    Cardiac Studies   Echo 05/29/22  1. Left ventricular ejection fraction, by estimation, is 25 to 30%. The  left ventricle has severely decreased function. The left ventricle  demonstrates regional wall motion abnormalities (see scoring  diagram/findings for description). There is mild left   ventricular hypertrophy. Left ventricular diastolic parameters are  consistent with Grade II diastolic dysfunction (pseudonormalization).  There is severe hypokinesis  of the left ventricular, mid-apical  anteroseptal wall, anterior wall and apical segment.   2. Right ventricular systolic function is normal. The right ventricular  size is normal. There is moderately elevated pulmonary artery systolic  pressure. The estimated right ventricular systolic pressure is 51.3 mmHg.   3. Left atrial size was mildly dilated.   4. Moderate pleural effusion in the left lateral region.   5. The mitral valve is normal in structure. Moderate mitral valve  regurgitation. No evidence of mitral stenosis. Moderate mitral annular  calcification.   6. Tricuspid valve regurgitation is moderate.   7. The aortic valve is calcified. Aortic valve regurgitation is mild.  Mild aortic valve stenosis. Suspect at least mild aortic stenosis. An  accurate gradient was not obtained.   8. The inferior vena cava is normal in size with <50% respiratory  variability, suggesting right atrial pressure of 8 mmHg.    Echo 01/2020  1. Left ventricular ejection fraction, by estimation, is 55 to 60%. The  left ventricle has normal function. The left ventricle has no regional  wall motion abnormalities. There is mild left ventricular hypertrophy.  Left ventricular diastolic parameters  are consistent with Grade I diastolic dysfunction (impaired relaxation).   2. Right ventricular systolic function is normal. The right ventricular  size is normal. Tricuspid regurgitation signal is inadequate for assessing  PA pressure.   3. The mitral valve is normal in structure. No evidence of mitral valve  regurgitation. No evidence of mitral stenosis.   4. The aortic valve is normal in structure. Aortic valve regurgitation is  not visualized. Mild to moderate aortic valve sclerosis/calcification is  present, without any evidence of aortic stenosis.   5. The inferior vena cava is normal in size with greater than 50%  respiratory variability, suggesting right atrial pressure of 3 mmHg.   Patient Profile      66 y.o. 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    Assessment & Plan    Acute combined systolic and diastolic heart failure - Echo this admission showed LVEF 25-30%, mild LVH, G2DD, HK of LV, normal RV - prior EF was 55-60% in 2021 - suspect ICM - IV lasix  80mg  BID - Net -1.9L - spironolactone 12.5mg  daily - metoprolol 25mg  daily - kidney function mildy worse - continue with diuresis, once euvolemic plan for R/L heart cath. Patient able to lay flat, can possible be tomorrow - continue GDMT aas kidney function and BP allow  Risks and benefits of cardiac catheterization have been discussed with the patient.  These include bleeding, infection, kidney damage, stroke, heart attack, death.  The patient understands these risks and is willing to proceed.    Elevated troponin - HS trop - suspect supply demand mismatch - no plan for IV heparin - continue ASA and plavix   Acute respiratory failure with hypoxia - 2/2 acute CHF exacerbation - improving with diuresis - s/p R thoracentesis R - he did not require L thoracentesis   CAP - CT chest with patchy upper infiltrates consistent with possible infectious process - WBC 12.9 on admission - abx per IM   Bilateral pleural effusions - s/p R thoracentesis with 1.2L off - L thoracentesis today   AKI - Scr 1.55 - Baseline around 1 - Scr stable today   DM2 - A1C 6.7   Anemia - Hgb 9 which is stable  For questions or updates, please contact [facility] Please consult [email or web address] for contact info under        Signed, Vaishnav Demartin , PA-C  05/31/2022, 8:30 AM

## 2022-05-31 NOTE — Plan of Care (Signed)
°  Problem: Activity: °Goal: Ability to return to baseline activity level will improve °Outcome: Progressing °  °Problem: Cardiovascular: °Goal: Ability to achieve and maintain adequate cardiovascular perfusion will improve °Outcome: Progressing °  °

## 2022-05-31 NOTE — Plan of Care (Signed)
Nutrition Education Note  RD consulted for nutrition education regarding CHF.  Spoke with pt at bedside, who was pleasant and in good spirits today. He reports that he does not add salt to food but requesting further guidance on salt and fluid restrictions. Pt reports drinking a lot of water PTA and uses mostly smart water bottles to fill up his water. Discussed fluid restriction and how he could use water bottles as a tool to help him monitor his fluid intake. Pt also interested in food tracking apps to help record food intake. RD discussed recommendations for these.   RD provided "Low Sodium Nutrition Therapy" handout from the [employer]. Reviewed patient's dietary recall. Provided examples on ways to decrease sodium intake in diet. Discouraged intake of processed foods and use of salt shaker. Encouraged fresh fruits and vegetables as well as whole grain sources of carbohydrates to maximize fiber intake.   RD discussed why it is important for patient to adhere to diet recommendations, and emphasized the role of fluids, foods to avoid, and importance of weighing self daily. Teach back method used.  Expect good compliance.  Current diet order is 2 gram with 1.5 L fluid restriction, patient is consuming approximately 100% of meals at this time. Labs and medications reviewed. No further nutrition interventions warranted at this time. RD contact information provided. If additional nutrition issues arise, please re-consult RD.   Levada Schilling, RD, LDN, CDCES Registered Dietitian II Certified Diabetes Care and Education Specialist Please refer to [facility] for RD and/or RD on-call/weekend/after hours pager

## 2022-06-01 DIAGNOSIS — N179 Acute kidney failure, unspecified: Secondary | ICD-10-CM | POA: Diagnosis not present

## 2022-06-01 DIAGNOSIS — E039 Hypothyroidism, unspecified: Secondary | ICD-10-CM | POA: Diagnosis not present

## 2022-06-01 DIAGNOSIS — J90 Pleural effusion, not elsewhere classified: Secondary | ICD-10-CM | POA: Diagnosis not present

## 2022-06-01 DIAGNOSIS — I5043 Acute on chronic combined systolic (congestive) and diastolic (congestive) heart failure: Secondary | ICD-10-CM | POA: Diagnosis not present

## 2022-06-01 LAB — BASIC METABOLIC PANEL
Anion gap: 6 (ref 5–15)
BUN: 34 mg/dL — ABNORMAL HIGH (ref 8–23)
CO2: 24 mmol/L (ref 22–32)
Calcium: 7.9 mg/dL — ABNORMAL LOW (ref 8.9–10.3)
Chloride: 109 mmol/L (ref 98–111)
Creatinine, Ser: 1.32 mg/dL — ABNORMAL HIGH (ref 0.61–1.24)
GFR, Estimated: >60 mL/min (ref >60)
Glucose, Bld: 86 mg/dL (ref 70–99)
Potassium: 3.7 mmol/L (ref 3.5–5.1)
Sodium: 139 mmol/L (ref 135–145)

## 2022-06-01 LAB — CULTURE, BLOOD (ROUTINE X 2)
Culture: NO GROWTH
Culture: NO GROWTH
Special Requests: ADEQUATE

## 2022-06-01 LAB — BODY FLUID CULTURE W GRAM STAIN: Culture: NO GROWTH

## 2022-06-01 LAB — GLUCOSE, CAPILLARY
Glucose-Capillary: 83 mg/dL (ref 70–99)
Glucose-Capillary: 85 mg/dL (ref 70–99)
Glucose-Capillary: 88 mg/dL (ref 70–99)
Glucose-Capillary: 97 mg/dL (ref 70–99)
Glucose-Capillary: 97 mg/dL (ref 70–99)
Glucose-Capillary: 148 mg/dL — ABNORMAL HIGH (ref 70–99)
Glucose-Capillary: 163 mg/dL — ABNORMAL HIGH (ref 70–99)

## 2022-06-01 LAB — MAGNESIUM: Magnesium: 2.1 mg/dL (ref 1.7–2.4)

## 2022-06-01 LAB — LIPOPROTEIN A (LPA): Lipoprotein (a): 166.5 nmol/L — ABNORMAL HIGH (ref <75.0)

## 2022-06-01 MED ORDER — ASPIRIN 81 MG PO CHEW: 81.0000 mg | CHEWABLE_TABLET | ORAL | Status: DC

## 2022-06-01 MED ORDER — SODIUM CHLORIDE 0.9 % IV SOLN: INTRAVENOUS | Status: DC

## 2022-06-01 MED ORDER — FUROSEMIDE 10 MG/ML IJ SOLN
60.0000 mg | Freq: Two times a day (BID) | INTRAMUSCULAR | Status: DC
  Administered 2022-06-01: 60 mg via INTRAVENOUS
  Filled 2022-06-01: qty 6

## 2022-06-01 MED ORDER — SODIUM CHLORIDE 0.9 % IV SOLN: INTRAVENOUS | Status: AC

## 2022-06-01 MED ORDER — ASPIRIN 81 MG PO CHEW
81.0000 mg | CHEWABLE_TABLET | ORAL | Status: AC
  Administered 2022-06-01: 81 mg via ORAL
  Filled 2022-06-01: qty 1

## 2022-06-01 MED ORDER — SPIRONOLACTONE 25 MG PO TABS
25.0000 mg | ORAL_TABLET | Freq: Every day | ORAL | Status: DC
  Administered 2022-06-03 – 2022-06-04 (×2): 25 mg via ORAL
  Filled 2022-06-01 (×2): qty 1

## 2022-06-01 NOTE — Progress Notes (Signed)
Patient requests to speak to cardiology in the AM prior to procedure. Reassurance to patient that RN will relay message to oncoming staffs. No other concern voices at this time. Patient is aware for procedure and to be NPO after MN.

## 2022-06-01 NOTE — Progress Notes (Signed)
   Heart Failure Nurse Navigator Note  With patient today, he was lying in bed in no acute distress.  His wife was present at the bedside.  He states that he is scheduled for cardiac catheterization to be later this afternoon.  He states right now he is feeling very tired as he had a lousy nights sleep with the vent blowing on him all night.  States that it helped talking with the dietitian yesterday and he had no further questions at this time.  Wife had questions as to where the heart failure clinic was located.  Gave directions.  They both had no further questions at this time.  Tresa Endo RN [facility]

## 2022-06-01 NOTE — Progress Notes (Signed)
PROGRESS NOTE    Gerald BoardDavid W Stieg  WUJ:[id number] DOB: 05/05/1956 DOA: 05/27/2022 PCP: Nelwyn SalisburyFry, Stephen A, MD   Brief Narrative: [name] is a 66 y.o. male with a history of hypertension, diabetes mellitus type 2, neuropathy, PAD, hypothyroidism, gangrene/osteomyelitis s/p recent left BKA presented to the hospital with stroke alert.  Patient was noted to have acute heart failure with bilateral pleural effusion, acute hypoxemic respiratory failure.  Patient was given supplemental oxygen and IV Lasix.  Cardiology was consulted.  At this time, patient is getting IV diuretics for decompensated heart failure.  Assessment and Plan: * Acute on chronic combined systolic and diastolic CHF (congestive heart failure) Patient presented with bilateral pleural effusion, vascular congestion with elevated BNP  of 1,547.  Cardiology on board.  2D echocardiogram shows LVEF of 25-30% with associated regional wall motion abnormalities and grade 2 diastolic dysfunction.  Continue IV Lasix, daily intake and output charting Daily weights.  On Lasix metoprolol spironolactone. As per cardiology renal dysfunction and blood pressure marginally low preventing additional ARB.Plan for right heart catheterization by cardiology on 12/14/ 2023.    Acute respiratory failure with hypoxia  Likely multifactorial from heart failure, pleural effusion and possible pneumonia.  CTA of the chest ruled out pulmonary embolism.  Patient was initially on supplemental oxygen which has been weaned down at this time.  Has clinically improved at this time.  Bilateral pleural effusion Likely secondary to heart failure.  Status post right thoracocentesis 12/11 with transudative fluid.  Small pleural effusion on the left so no tapping was done.  On IV Lasix which has been reduced to 40 mg twice daily.  Demand ischemia Elevated troponin but no EKG changes or chest pain.    CAP (community acquired pneumonia) CT angio of the chest significant for  patchy upper lung infiltrates consistent with possible infectious process. COVID-19 negative.  Patient did have mild leukocytosis on presentation with undetectable procalcitonin.  Blood cultures negative so far.  Received empiric Rocephin and Zithromax for 5 days.  Pleural fluid without any organisms.  Blood cultures negative in 5 days.  AKI (acute kidney injury) (HCC) Baseline creatinine is about 1. Creatinine of 1.66 on admission which is slightly elevated from creatinine of 1.45 from recent hospital discharge. Likely secondary to fluid overload , Vancomycin and IV contrast at recent hospitalization; IV contrast received this admission for CTA.  Creatinine today at 1.3.  Metabolic acidosis-resolved   Mild hypokalemia.  Improved after replacement.  Latest potassium was 3.7.  On spironolactone.  Will closely monitor  Controlled type 2 diabetes mellitus with hyperglycemia, with Kinser-term current use of insulin (HCC) Patient is on insulin 70/30 25 units BID as an outpatient.  Hemoglobin A1c 7 days prior to presentation at 6.7.  Currently on Levemir and sliding scale insulin.  Anemia Likely from perioperative blood loss from recent surgery.  Latest hemoglobin of 8.5.  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  Physical therapy has seen the patient today and recommend home PT on discharge.  Hypothyroidism -Continue Synthroid   DVT prophylaxis: SCDs  Code Status:   Code Status: Full Code  Family Communication:  Spoke with the patient's wife at bedside.  Disposition Plan:  Likely home with home health in 1 to 2 days.  Pending right heart cath 06/01/2022.  Consultants:  Cardiology  Procedures:  Transthoracic Echocardiogram  Antimicrobials: Ceftriaxone Azithromycin    Subjective: Today, patient was seen and examined at bedside.  Patient denies any nausea vomiting fever chills or rigor.  Awaiting for  cardiac catheterization today.  Denies any dyspnea or chest pain.   Objective: BP (!) 119/59 (BP Location: Right Arm)   Pulse 83   Temp 98.1 F (36.7 C) (Oral)   Resp 16   Ht 5\' 8"  (1.727 m)   Wt 96.2 kg   SpO2 99%   BMI 32.25 kg/m   Physical examination:  General:  Average built, not in obvious distress awake and Communicative HENT:   No scleral pallor or icterus noted. Oral mucosa is moist.  Chest:   Diminished breath sounds bilaterally.  No crackles or wheezes noted CVS: S1 &S2 heard.  Regular rhythm. Abdomen: Soft, nontender, nondistended.  Bowel sounds are heard.   Extremities: Right lower extremity with pitting edema, left below-knee amputation.   Psych: Alert, awake and oriented, normal mood CNS:  No cranial nerve deficits.  Moves extremities. Skin: Warm and dry.    Data Reviewed: I have personally reviewed following labs and imaging studies  CBC Lab Results  Component Value Date   WBC 8.0 05/31/2022   RBC 2.79 (L) 05/31/2022   HGB 8.5 (L) 05/31/2022   HCT 27.2 (L) 05/31/2022   MCV 97.5 05/31/2022   MCH 30.5 05/31/2022   PLT 320 05/31/2022   MCHC 31.3 05/31/2022   RDW 15.0 05/31/2022   LYMPHSABS 1.6 05/27/2022   MONOABS 0.8 05/27/2022   EOSABS 0.2 05/27/2022   BASOSABS 0.1 05/27/2022     Last metabolic panel Lab Results  Component Value Date   NA 139 06/01/2022   K 3.7 06/01/2022   CL 109 06/01/2022   CO2 24 06/01/2022   BUN 34 (H) 06/01/2022   CREATININE 1.32 (H) 06/01/2022   GLUCOSE 86 06/01/2022   GFRNONAA >60 06/01/2022   GFRAA >60 01/10/2020   CALCIUM 7.9 (L) 06/01/2022   PHOS 2.7 05/18/2022   PROT 7.1 05/28/2022   ALBUMIN 2.8 (L) 05/27/2022   BILITOT 0.8 05/27/2022   ALKPHOS 95 05/27/2022   AST 23 05/27/2022   ALT 24 05/27/2022   ANIONGAP 6 06/01/2022    GFR: Estimated Creatinine Clearance: 63.6 mL/min (A) (by C-G formula based on SCr of 1.32 mg/dL (H)).  Recent Results (from the past 240 hour(s))  Resp panel by RT-PCR (RSV, Flu  A&B, Covid) Anterior Nasal Swab     Status: None   Collection Time: 05/27/22  6:47 PM   Specimen: Anterior Nasal Swab  Result Value Ref Range Status   SARS Coronavirus 2 by RT PCR NEGATIVE NEGATIVE Final    Comment: (NOTE) SARS-CoV-2 target nucleic acids are NOT DETECTED.  The SARS-CoV-2 RNA is generally detectable in upper respiratory specimens during the acute phase of infection. The lowest concentration of SARS-CoV-2 viral copies this assay can detect is 138 copies/mL. A negative result does not preclude SARS-Cov-2 infection and should not be used as the sole basis for treatment or other patient management decisions. A negative result may occur with  improper specimen collection/handling, submission of specimen other than nasopharyngeal swab, presence of viral mutation(s) within the areas targeted by this assay, and inadequate number of viral copies(<138 copies/mL). A negative result must be combined with clinical observations, patient history, and epidemiological information. The expected result is Negative.  Fact Sheet for Patients:  14/09/23  Fact Sheet for Healthcare Providers:  BloggerCourse.com  This test is no t yet approved or cleared by the [place] FDA and  has been authorized for detection and/or diagnosis of SARS-CoV-2  by FDA under an Emergency Use Authorization (EUA). This EUA will remain  in effect (meaning this test can be used) for the duration of the COVID-19 declaration under Section 564(b)(1) of the Act, 21 U.S.C.section 360bbb-3(b)(1), unless the authorization is terminated  or revoked sooner.       Influenza A by PCR NEGATIVE NEGATIVE Final   Influenza B by PCR NEGATIVE NEGATIVE Final    Comment: (NOTE) The Xpert Xpress SARS-CoV-2/FLU/RSV plus assay is intended as an aid in the diagnosis of influenza from Nasopharyngeal swab specimens and should not be used as a sole basis for treatment.  Nasal washings and aspirates are unacceptable for Xpert Xpress SARS-CoV-2/FLU/RSV testing.  Fact Sheet for Patients: BloggerCourse.com  Fact Sheet for Healthcare Providers: SeriousBroker.it  This test is not yet approved or cleared by the [place] FDA and has been authorized for detection and/or diagnosis of SARS-CoV-2 by FDA under an Emergency Use Authorization (EUA). This EUA will remain in effect (meaning this test can be used) for the duration of the COVID-19 declaration under Section 564(b)(1) of the Act, 21 U.S.C. section 360bbb-3(b)(1), unless the authorization is terminated or revoked.     Resp Syncytial Virus by PCR NEGATIVE NEGATIVE Final    Comment: (NOTE) Fact Sheet for Patients: BloggerCourse.com  Fact Sheet for Healthcare Providers: [employer]ious[employer]ker[employer]it  This test is not yet approved or cleared by the [place] FDA and has been authorized for detection and/or diagnosis of SARS-CoV-2 by FDA under an Emergency Use Authorization (EUA). This EUA will remain in effect (meaning this test can be used) for the duration of the COVID-19 declaration under Section 564(b)(1) of the Act, 21 U.S.C. section 360bbb-3(b)(1), unless the authorization is terminated or revoked.  Performed at [facility], [address] [place], [place] [address]   Culture, blood (routine x 2)     Status: None   Collection Time: 05/27/22  9:18 PM   Specimen: BLOOD  Result Value Ref Range Status   Specimen Description BLOOD BLOOD LEFT ARM  Final   Special Requests   Final    BOTTLES DRAWN AEROBIC AND ANAEROBIC Blood Culture adequate volume   Culture   Final    NO GROWTH 5 DAYS Performed at [facility], [address] [place], [place] [address]    Report Status 06/01/2022 FINAL  Final  Culture, blood (routine x 2)     Status: None   Collection Time: 05/27/22  9:32 PM    Specimen: BLOOD RIGHT HAND  Result Value Ref Range Status   Specimen Description BLOOD RIGHT HAND  Final   Special Requests   Final    Blood Culture results may not be optimal due to an inadequate volume of blood received in culture bottles   Culture   Final    NO GROWTH 5 DAYS Performed at [facility], [address] [place], [place] [address]    Report Status 06/01/2022 FINAL  Final  Body fluid culture w Gram Stain     Status: None   Collection Time: 05/29/22  9:15 AM   Specimen: PATH Cytology Pleural fluid  Result Value Ref Range Status   Specimen Description   Final    PLEURAL Performed at [facility], [address], [place], [place] [address]    Special Requests   Final    NONE Performed at [facility], [name], [place], [place] [address]    Gram Stain   Final    RARE WBC PRESENT,  PREDOMINANTLY PMN NO ORGANISMS SEEN    Culture   Final    NO GROWTH 3 DAYS Performed at [facility], [address], [place], [place] [address]    Report Status 06/01/2022 FINAL  Final      Radiology Studies: [place] CHEST (PLEURAL EFFUSION)  Result Date: 05/30/2022 CLINICAL DATA:  LEFT pleural effusion EXAM: LIMITED CHEST ULTRASOUND COMPARISON:  Chest XR, 05/29/2022. IR ultrasound, 05/29/2022. CTA chest, 05/27/2022. FINDINGS: Informed written consent was obtained from the the patient and/or patient's representative after a discussion of the risks, benefits and alternatives to treatment. A timeout was performed prior to the initiation of the procedure. Focused ultrasound along the LEFT chest demonstrates a small volume pleural effusion. The patient denied shortness of breath and given a small effusion thoracentesis was aborted. Procedure performed by Anders Grant, IR NP IMPRESSION: Small volume LEFT pleural effusion. Planned thoracentesis was aborted. Roanna Banning, MD [facility] Radiology  Electronically Signed   By: Roanna Banning M.D.   On: 05/30/2022 15:58      LOS: 5 days    Joycelyn Das, MD [facility] 06/01/2022, 11:47 AM  If 7PM-7AM, please contact night-coverage [email or web address]

## 2022-06-01 NOTE — Progress Notes (Addendum)
Physical Therapy Treatment Patient Details Name: [name] MRN: [id number] DOB: [birth year] Today's Date: 06/01/2022   History of Present Illness 66 yo male with acute on chronic CHF. PMH of hypertension, diabetes, peripheral vascular disease, hypothyroidism and a recent left below the knee amputation secondary to gangrene    PT Comments    Pt found supine in bed upon PT entry. After max encouragement, pt agreed to participate in mobility and tolerated bed level exercises well. Pt mod I with bed mobility to the EOB and able to maintain static sitting balance. Pt able to verbalize proper steps to a stand pivot transfer. Pt would benefit from skilled physical therapy to address the listed deficits (see below) to increase independence with ADLs and function. Current recommendation remains appropriate.     Recommendations for follow up therapy are one component of a multi-disciplinary discharge planning process, led by the attending physician.  Recommendations may be updated based on patient status, additional functional criteria and insurance authorization.  Follow Up Recommendations  Home health PT     Assistance Recommended at Discharge Frequent or constant Supervision/Assistance  Patient can return home with the following A little help with walking and/or transfers;A little help with bathing/dressing/bathroom;Assistance with cooking/housework;Assist for transportation;Help with stairs or ramp for entrance   Equipment Recommendations  None recommended by PT    Recommendations for Other Services       Precautions / Restrictions Precautions Precautions: Fall Restrictions Weight Bearing Restrictions: Yes LLE Weight Bearing: Weight bearing as tolerated Other Position/Activity Restrictions: Full WB'ing R leg; NWB L LE s/p BKA     Mobility  Bed Mobility Overal bed mobility: Modified Independent Bed Mobility: Supine to Sit       Sit to supine: HOB elevated        Transfers                         Ambulation/Gait                   Stairs             Wheelchair Mobility    Modified Rankin (Stroke Patients Only)       Balance Overall balance assessment: Needs assistance Sitting-balance support: Feet supported Sitting balance-Leahy Scale: Good                                      Cognition Arousal/Alertness: Awake/alert Behavior During Therapy: WFL for tasks assessed/performed Overall Cognitive Status: Within Functional Limits for tasks assessed                                          Exercises Amputee Exercises Hip ABduction/ADduction: AROM, Left, 10 reps, Supine Knee Flexion: AROM, 10 reps, Left, Supine Straight Leg Raises: AROM, Supine, Left, 10 reps    General Comments        Pertinent Vitals/Pain Pain Assessment Pain Assessment: No/denies pain    Home Living                          Prior Function            PT Goals (current goals can now be found in the care plan section) Acute Rehab PT Goals Patient Stated Goal: to  return home PT Goal Formulation: With patient Time For Goal Achievement: 06/03/22 Potential to Achieve Goals: Good Progress towards PT goals: Progressing toward goals    Frequency    Min 2X/week      PT Plan Current plan remains appropriate    Co-evaluation              AM-PAC PT "6 Clicks" Mobility   Outcome Measure  Help needed turning from your back to your side while in a flat bed without using bedrails?: None Help needed moving from lying on your back to sitting on the side of a flat bed without using bedrails?: None Help needed moving to and from a bed to a chair (including a wheelchair)?: A Little Help needed standing up from a chair using your arms (e.g., wheelchair or bedside chair)?: A Little Help needed to walk in hospital room?: Total Help needed climbing 3-5 steps with a railing? : Total 6 Click Score: 16     End of Session   Activity Tolerance: Patient tolerated treatment well Patient left: in bed Nurse Communication: Mobility status PT Visit Diagnosis: Unsteadiness on feet (R26.81);Other abnormalities of gait and mobility (R26.89);Muscle weakness (generalized) (M62.81)     Time: 6578-4696 PT Time Calculation (min) (ACUTE ONLY): 14 min  Charges:                        Tresa Endo O'Daniel, SPT  06/01/2022, 1:50 PM

## 2022-06-01 NOTE — Significant Event (Signed)
Patient reported that his home glucometer alerted him that his sugar is in the 50s, he denied any symptoms at this time. Blood sugar perform per request of 85. No other distress or concerns voiced. Will continue to monitor.

## 2022-06-01 NOTE — Progress Notes (Signed)
Advanced Heart Failure Rounding Note   Subjective:    Diuresing well. Weight down nearly 20 pounds but still with PND. No CP.   Wants to go home    Objective:   Weight Range:  Vital Signs:   Temp:  [97.9 F (36.6 C)-98.6 F (37 C)] 98.2 F (36.8 C) (12/14 1159) Pulse Rate:  [80-92] 80 (12/14 1159) Resp:  [16-18] 18 (12/14 1159) BP: (103-123)/(56-65) 121/62 (12/14 1159) SpO2:  [98 %-100 %] 100 % (12/14 1159) Weight:  [96.2 kg] 96.2 kg (12/14 0500) Last BM Date : 05/29/22  Weight change: Filed Weights   05/30/22 1543 05/31/22 0554 06/01/22 0500  Weight: 96.9 kg 96.6 kg 96.2 kg    Intake/Output:   Intake/Output Summary (Last 24 hours) at 06/01/2022 1227 Last data filed at 06/01/2022 0836 Gross per 24 hour  Intake 1077.85 ml  Output 1050 ml  Net 27.85 ml     Physical Exam: General:  Sitting up on side of bed. No resp difficulty HEENT: normal Neck: supple. JVP to jaw . Carotids 2+ bilat; no bruits. No lymphadenopathy or thryomegaly appreciated. Cor: PMI nondisplaced. Regular rate & rhythm. No rubs, gallops or murmurs. Lungs: clear decreased at R base Abdomen: soft, nontender, nondistended. No hepatosplenomegaly. No bruits or masses. Good bowel sounds. Extremities: no cyanosis, clubbing, rash, 2+ edema on R s/p L BKA Neuro: alert & orientedx3, cranial nerves grossly intact. moves all 4 extremities w/o difficulty. Affect pleasant  Telemetry: sinus 80s Personally reviewed  Labs: Basic Metabolic Panel: Recent Labs  Lab 05/28/22 0505 05/29/22 1829 05/30/22 0517 05/31/22 0315 06/01/22 0358  NA 130* 136 139 138 139  K 3.9 3.7 3.8 3.4* 3.7  CL 102 104 108 106 109  CO2 20* 22 24 25 24   GLUCOSE 223* 145* 85 144* 86  BUN 54* 41* 38* 37* 34*  CREATININE 1.55* 1.29* 1.40* 1.44* 1.32*  CALCIUM 8.0* 8.0* 7.8* 7.6* 7.9*  MG  --   --   --   --  2.1    Liver Function Tests: Recent Labs  Lab 05/27/22 1846 05/28/22 0505  AST 23  --   ALT 24  --   ALKPHOS  95  --   BILITOT 0.8  --   PROT 7.5 7.1  ALBUMIN 2.8*  --    No results for input(s): "LIPASE", "AMYLASE" in the last 168 hours. No results for input(s): "AMMONIA" in the last 168 hours.  CBC: Recent Labs  Lab 05/27/22 1846 05/28/22 0505 05/29/22 1829 05/31/22 1618  WBC 12.9* 13.0* 10.0 8.0  NEUTROABS 10.0*  --   --   --   HGB 9.6* 9.0* 9.3* 8.5*  HCT 29.1* 27.5* 28.5* 27.2*  MCV 95.4 96.8 96.0 97.5  PLT 429* 352 371 320    Cardiac Enzymes: No results for input(s): "CKTOTAL", "CKMB", "CKMBINDEX", "TROPONINI" in the last 168 hours.  BNP: BNP (last 3 results) Recent Labs    05/27/22 1851  BNP 1,547.0*    ProBNP (last 3 results) No results for input(s): "PROBNP" in the last 8760 hours.    Other results:  Imaging: 14/09/23 CHEST (PLEURAL EFFUSION)  Result Date: 05/30/2022 CLINICAL DATA:  LEFT pleural effusion EXAM: LIMITED CHEST ULTRASOUND COMPARISON:  Chest XR, 05/29/2022. IR ultrasound, 05/29/2022. CTA chest, 05/27/2022. FINDINGS: Informed written consent was obtained from the the patient and/or patient's representative after a discussion of the risks, benefits and alternatives to treatment. A timeout was performed prior to the initiation of the procedure. Focused ultrasound along  the LEFT chest demonstrates a small volume pleural effusion. The patient denied shortness of breath and given a small effusion thoracentesis was aborted. Procedure performed by Anders Grant, IR NP IMPRESSION: Small volume LEFT pleural effusion. Planned thoracentesis was aborted. Roanna Banning, MD [facility] Radiology Electronically Signed   By: Roanna Banning M.D.   On: 05/30/2022 15:58     Medications:     Scheduled Medications:  aspirin  81 mg Oral Pre-Cath   aspirin EC  81 mg Oral Daily   atorvastatin  20 mg Oral Daily   clopidogrel  75 mg Oral Daily   furosemide  40 mg Intravenous BID   insulin aspart  0-15 Units Subcutaneous Q4H    insulin detemir  5 Units Subcutaneous BID   levothyroxine  50 mcg Oral Q0600   metoprolol succinate  25 mg Oral Daily   sodium chloride flush  3 mL Intravenous Q12H   spironolactone  12.5 mg Oral Daily    Infusions:  sodium chloride 10 mL/hr at 05/30/22 2235   [START ON 06/02/2022] sodium chloride      PRN Medications: sodium chloride, acetaminophen **OR** acetaminophen, albuterol, ALPRAZolam, alum & mag hydroxide-simeth, HYDROcodone-acetaminophen, morphine injection, ondansetron **OR** ondansetron (ZOFRAN) IV   Assessment/plan:   Acute combined systolic and diastolic heart failure - Echo 16/10: EF 25-30%, LV with regional wall abnormalities. Mild LVH, GIIDD, severe hypokineses of LV. Normal RV systolic function. RVSP ~51.3. LA mildly dilated. Mod pleural effusion. Mod MR, mod mitral annular calcifications, Mod TR. Mild aortic valve regurgitation.  - Presented NYHA class IIIb-IV - Echo and ECG consistent with occluded LAD. Suspect severe underlying diabetic CAD - Diuresing well but still fluid overloaded. Increase lasix to 60 IV bid Place TED hose on RLE - Continue Toprol and spiro. Increase spiro to 25 daily - Add ARB/ARNI and SGLT2i post cath - Plan R/L cath today Elevated troponin - HsTrop 803>724 on admission suspect demand ischemia +/- evolving post-op MI - ECG c/w occluded LAD. Timing unclear suspect peri-op - No current angina - Continue ASA and plavix  - Cath today Acute respiratory failure with hypoxia - 2/2 acute CHF exacerbation - Improved with diuresis and thoracentesis  - stable on 2L [place] 4. Bilateral pleural effusions - s/p R thoracentesis - small effusion on left  5. AKI - Cr. 1.55 on admit. Scr 1.3 today  - Baseline around 1.0 - avoid hypotension 6. DMII - Hgb A1c 6.7 - SSI per primary - Add SGLt2i prior to d/c  7. PAD with recent L BKA - per primary  Length of Stay: 5   Sharanda Shinault 06/01/2022, 12:27 PM  Advanced Heart Failure Team Pager  [phone] (M-F; 7a - 4p)  Please contact [facility] for night-coverage after hours (4p -7a ) and weekends on [email or web address]

## 2022-06-02 ENCOUNTER — Encounter
Admission: EM | Disposition: A | Discharge status: Home Health | Payer: Self-pay | Source: Home / Self Care | Attending: Internal Medicine

## 2022-06-02 DIAGNOSIS — E118 Type 2 diabetes mellitus with unspecified complications: Secondary | ICD-10-CM

## 2022-06-02 DIAGNOSIS — I2510 Atherosclerotic heart disease of native coronary artery without angina pectoris: Secondary | ICD-10-CM | POA: Diagnosis not present

## 2022-06-02 DIAGNOSIS — I739 Peripheral vascular disease, unspecified: Secondary | ICD-10-CM

## 2022-06-02 DIAGNOSIS — N179 Acute kidney failure, unspecified: Secondary | ICD-10-CM | POA: Diagnosis not present

## 2022-06-02 DIAGNOSIS — J810 Acute pulmonary edema: Principal | ICD-10-CM

## 2022-06-02 DIAGNOSIS — E1165 Type 2 diabetes mellitus with hyperglycemia: Secondary | ICD-10-CM | POA: Diagnosis not present

## 2022-06-02 DIAGNOSIS — J90 Pleural effusion, not elsewhere classified: Secondary | ICD-10-CM | POA: Diagnosis not present

## 2022-06-02 DIAGNOSIS — R7989 Other specified abnormal findings of blood chemistry: Secondary | ICD-10-CM

## 2022-06-02 DIAGNOSIS — J9601 Acute respiratory failure with hypoxia: Secondary | ICD-10-CM | POA: Diagnosis not present

## 2022-06-02 DIAGNOSIS — E039 Hypothyroidism, unspecified: Secondary | ICD-10-CM | POA: Diagnosis not present

## 2022-06-02 DIAGNOSIS — I5043 Acute on chronic combined systolic (congestive) and diastolic (congestive) heart failure: Secondary | ICD-10-CM | POA: Diagnosis not present

## 2022-06-02 HISTORY — PX: RIGHT/LEFT HEART CATH AND CORONARY ANGIOGRAPHY: CATH118266

## 2022-06-02 LAB — CBC
HCT: 28.1 % — ABNORMAL LOW (ref 39.0–52.0)
HCT: 29 % — ABNORMAL LOW (ref 39.0–52.0)
Hemoglobin: 8.7 g/dL — ABNORMAL LOW (ref 13.0–17.0)
Hemoglobin: 9.2 g/dL — ABNORMAL LOW (ref 13.0–17.0)
MCH: 30.7 pg (ref 26.0–34.0)
MCH: 31.4 pg (ref 26.0–34.0)
MCHC: 31 g/dL (ref 30.0–36.0)
MCHC: 31.7 g/dL (ref 30.0–36.0)
MCV: 99.3 fL (ref 80.0–100.0)
MCV: 99 fL (ref 80.0–100.0)
Platelets: 273 10*3/uL (ref 150–400)
Platelets: 250 10*3/uL (ref 150–400)
RBC: 2.83 MIL/uL — ABNORMAL LOW (ref 4.22–5.81)
RBC: 2.93 MIL/uL — ABNORMAL LOW (ref 4.22–5.81)
RDW: 15.1 % (ref 11.5–15.5)
RDW: 15.1 % (ref 11.5–15.5)
WBC: 7.8 10*3/uL (ref 4.0–10.5)
WBC: 8.9 10*3/uL (ref 4.0–10.5)
nRBC: 0 % (ref 0.0–0.2)
nRBC: 0 % (ref 0.0–0.2)

## 2022-06-02 LAB — POCT I-STAT EG7
Acid-base deficit: 2 mmol/L (ref 0.0–2.0)
Bicarbonate: 22.3 mmol/L (ref 20.0–28.0)
Calcium, Ion: 1.16 mmol/L (ref 1.15–1.40)
HCT: 26 % — ABNORMAL LOW (ref 39.0–52.0)
Hemoglobin: 8.8 g/dL — ABNORMAL LOW (ref 13.0–17.0)
O2 Saturation: 60 %
Potassium: 3.9 mmol/L (ref 3.5–5.1)
Sodium: 142 mmol/L (ref 135–145)
TCO2: 23 mmol/L (ref 22–32)
pCO2, Ven: 35.1 mmHg — ABNORMAL LOW (ref 44–60)
pH, Ven: 7.41 (ref 7.25–7.43)
pO2, Ven: 31 mmHg — CL (ref 32–45)

## 2022-06-02 LAB — POCT I-STAT 7, (LYTES, BLD GAS, ICA,H+H)
Acid-base deficit: 2 mmol/L (ref 0.0–2.0)
Bicarbonate: 21.8 mmol/L (ref 20.0–28.0)
Calcium, Ion: 1.14 mmol/L — ABNORMAL LOW (ref 1.15–1.40)
HCT: 24 % — ABNORMAL LOW (ref 39.0–52.0)
Hemoglobin: 8.2 g/dL — ABNORMAL LOW (ref 13.0–17.0)
O2 Saturation: 91 %
Potassium: 3.8 mmol/L (ref 3.5–5.1)
Sodium: 139 mmol/L (ref 135–145)
TCO2: 23 mmol/L (ref 22–32)
pCO2 arterial: 33 mmHg (ref 32–48)
pH, Arterial: 7.427 (ref 7.35–7.45)
pO2, Arterial: 58 mmHg — ABNORMAL LOW (ref 83–108)

## 2022-06-02 LAB — GLUCOSE, CAPILLARY
Glucose-Capillary: 130 mg/dL — ABNORMAL HIGH (ref 70–99)
Glucose-Capillary: 119 mg/dL — ABNORMAL HIGH (ref 70–99)
Glucose-Capillary: 111 mg/dL — ABNORMAL HIGH (ref 70–99)
Glucose-Capillary: 167 mg/dL — ABNORMAL HIGH (ref 70–99)
Glucose-Capillary: 190 mg/dL — ABNORMAL HIGH (ref 70–99)
Glucose-Capillary: 93 mg/dL (ref 70–99)

## 2022-06-02 LAB — BASIC METABOLIC PANEL
Anion gap: 4 — ABNORMAL LOW (ref 5–15)
BUN: 32 mg/dL — ABNORMAL HIGH (ref 8–23)
CO2: 26 mmol/L (ref 22–32)
Calcium: 8.3 mg/dL — ABNORMAL LOW (ref 8.9–10.3)
Chloride: 109 mmol/L (ref 98–111)
Creatinine, Ser: 1.28 mg/dL — ABNORMAL HIGH (ref 0.61–1.24)
GFR, Estimated: >60 mL/min (ref >60)
Glucose, Bld: 124 mg/dL — ABNORMAL HIGH (ref 70–99)
Potassium: 3.9 mmol/L (ref 3.5–5.1)
Sodium: 139 mmol/L (ref 135–145)

## 2022-06-02 SURGERY — RIGHT/LEFT HEART CATH AND CORONARY ANGIOGRAPHY
Anesthesia: Moderate Sedation

## 2022-06-02 MED ORDER — LIDOCAINE HCL 1 % IJ SOLN
INTRAMUSCULAR | Status: AC
  Filled 2022-06-02: qty 20

## 2022-06-02 MED ORDER — HEPARIN (PORCINE) IN NACL 1000-0.9 UT/500ML-% IV SOLN
INTRAVENOUS | Status: DC | PRN
  Administered 2022-06-02 (×2): 500 mL

## 2022-06-02 MED ORDER — FUROSEMIDE 10 MG/ML IJ SOLN
80.0000 mg | Freq: Two times a day (BID) | INTRAMUSCULAR | Status: DC
  Administered 2022-06-02 – 2022-06-04 (×4): 80 mg via INTRAVENOUS
  Filled 2022-06-02 (×4): qty 8

## 2022-06-02 MED ORDER — LIDOCAINE HCL (PF) 1 % IJ SOLN
INTRAMUSCULAR | Status: DC | PRN
  Administered 2022-06-02: 2 mL
  Administered 2022-06-02: 1 mL

## 2022-06-02 MED ORDER — MIDAZOLAM HCL 2 MG/2ML IJ SOLN
INTRAMUSCULAR | Status: AC
  Filled 2022-06-02: qty 2

## 2022-06-02 MED ORDER — SODIUM CHLORIDE 0.9 % IV SOLN: 250.0000 mL | INTRAVENOUS | Status: DC | PRN

## 2022-06-02 MED ORDER — VERAPAMIL HCL 2.5 MG/ML IV SOLN
INTRAVENOUS | Status: AC
  Filled 2022-06-02: qty 2

## 2022-06-02 MED ORDER — FENTANYL CITRATE (PF) 100 MCG/2ML IJ SOLN
INTRAMUSCULAR | Status: AC
  Filled 2022-06-02: qty 2

## 2022-06-02 MED ORDER — FENTANYL CITRATE (PF) 100 MCG/2ML IJ SOLN
INTRAMUSCULAR | Status: DC | PRN
  Administered 2022-06-02: 25 ug via INTRAVENOUS

## 2022-06-02 MED ORDER — MIDAZOLAM HCL 2 MG/2ML IJ SOLN
INTRAMUSCULAR | Status: DC | PRN
  Administered 2022-06-02: 1 mg via INTRAVENOUS

## 2022-06-02 MED ORDER — HYDRALAZINE HCL 20 MG/ML IJ SOLN: 10.0000 mg | INTRAMUSCULAR | Status: AC | PRN

## 2022-06-02 MED ORDER — HEPARIN SODIUM (PORCINE) 1000 UNIT/ML IJ SOLN
INTRAMUSCULAR | Status: AC
  Filled 2022-06-02: qty 10

## 2022-06-02 MED ORDER — IOHEXOL 300 MG/ML  SOLN
INTRAMUSCULAR | Status: DC | PRN
  Administered 2022-06-02: 31 mL

## 2022-06-02 MED ORDER — VERAPAMIL HCL 2.5 MG/ML IV SOLN
INTRAVENOUS | Status: DC | PRN
  Administered 2022-06-02 (×2): 2.5 mg via INTRA_ARTERIAL

## 2022-06-02 MED ORDER — HEPARIN (PORCINE) IN NACL 1000-0.9 UT/500ML-% IV SOLN
INTRAVENOUS | Status: AC
  Filled 2022-06-02: qty 1000

## 2022-06-02 MED ORDER — SODIUM CHLORIDE 0.9% FLUSH
3.0000 mL | Freq: Two times a day (BID) | INTRAVENOUS | Status: DC
  Administered 2022-06-02 – 2022-06-03 (×2): 3 mL via INTRAVENOUS

## 2022-06-02 MED ORDER — SODIUM CHLORIDE 0.9% FLUSH: 3.0000 mL | INTRAVENOUS | Status: DC | PRN

## 2022-06-02 MED ORDER — HEPARIN SODIUM (PORCINE) 1000 UNIT/ML IJ SOLN
INTRAMUSCULAR | Status: DC | PRN
  Administered 2022-06-02: 5000 [IU] via INTRAVENOUS

## 2022-06-02 MED ORDER — ENOXAPARIN SODIUM 40 MG/0.4ML IJ SOSY
40.0000 mg | PREFILLED_SYRINGE | INTRAMUSCULAR | Status: DC
  Administered 2022-06-03 – 2022-06-04 (×2): 40 mg via SUBCUTANEOUS
  Filled 2022-06-02 (×3): qty 0.4

## 2022-06-02 SURGICAL SUPPLY — 13 items
BAND CMPR LRG ZPHR (HEMOSTASIS) ×1
BAND ZEPHYR COMPRESS 30 LONG (HEMOSTASIS) IMPLANT
CATH 5FR JL3.5 JR4 ANG PIG MP (CATHETERS) IMPLANT
CATH SWAN GANZ 7F STRAIGHT (CATHETERS) IMPLANT
DRAPE BRACHIAL (DRAPES) IMPLANT
GLIDESHEATH SLEND SS 6F .021 (SHEATH) IMPLANT
GLIDESHEATH SLENDER 7FR .021G (SHEATH) IMPLANT
GUIDEWIRE INQWIRE 1.5J.035X260 (WIRE) IMPLANT
INQWIRE 1.5J .035X260CM (WIRE) ×1
PACK CARDIAC CATH (CUSTOM PROCEDURE TRAY) ×1 IMPLANT
PROTECTION STATION PRESSURIZED (MISCELLANEOUS) ×1
SET ATX SIMPLICITY (MISCELLANEOUS) IMPLANT
STATION PROTECTION PRESSURIZED (MISCELLANEOUS) IMPLANT

## 2022-06-02 NOTE — H&P (View-Only) (Signed)
Cardiology Progress Note   Patient Name: [name] Date of Encounter: 06/02/2022  Primary Cardiologist: Julien Nordmann, MD  Subjective   No chest pain or sob.  For cath this AM.  Concerned about ability to mobilize post-cath related to wrist restrictions.  All questions answered, wife @ bedside.  Inpatient Medications    Scheduled Meds:  aspirin EC  81 mg Oral Daily   atorvastatin  20 mg Oral Daily   clopidogrel  75 mg Oral Daily   furosemide  60 mg Intravenous BID   insulin aspart  0-15 Units Subcutaneous Q4H   insulin detemir  5 Units Subcutaneous BID   levothyroxine  50 mcg Oral Q0600   metoprolol succinate  25 mg Oral Daily   sodium chloride flush  3 mL Intravenous Q12H   spironolactone  25 mg Oral Daily   Continuous Infusions:  sodium chloride 10 mL/hr at 05/30/22 2235   sodium chloride     PRN Meds: sodium chloride, sodium chloride, acetaminophen **OR** acetaminophen, albuterol, ALPRAZolam, alum & mag hydroxide-simeth, HYDROcodone-acetaminophen, morphine injection, ondansetron **OR** ondansetron (ZOFRAN) IV, sodium chloride flush   Vital Signs    Vitals:   06/02/22 0155 06/02/22 0426 06/02/22 0445 06/02/22 0813  BP: (!) 115/50 (!) 122/59  124/68  Pulse: 86 92  86  Resp: 18 18  18   Temp: 98.2 F (36.8 C) 98.1 F (36.7 C)  98 F (36.7 C)  TempSrc: Oral Oral    SpO2: 98% 99%  99%  Weight:   95.4 kg   Height:        Intake/Output Summary (Last 24 hours) at 06/02/2022 1011 Last data filed at 06/02/2022 0617 Gross per 24 hour  Intake 120 ml  Output 1500 ml  Net -1380 ml   Filed Weights   05/31/22 0554 06/01/22 0500 06/02/22 0445  Weight: 96.6 kg 96.2 kg 95.4 kg    Physical Exam   GEN: Well nourished, well developed, in no acute distress.  HEENT: Grossly normal.  Neck: Supple, JVD to jaw, no carotid bruits or masses. Cardiac: RRR, no murmurs, rubs, or gallops. No clubbing, cyanosis, 2+ R LE edema.  L BKA.   Respiratory:  Respirations regular  and unlabored, diminished breath sounds @ bases. GI: Soft, nontender, nondistended, BS + x 4. MS: no deformity or atrophy. Skin: warm and dry, no rash. Neuro:  Strength and sensation are intact. Psych: AAOx3.  Normal affect.  Labs    Chemistry Recent Labs  Lab 05/27/22 1846 05/28/22 0505 05/29/22 1829 05/31/22 0315 06/01/22 0358 06/02/22 0658  NA 128* 130*   < > 138 139 139  K 3.6 3.9   < > 3.4* 3.7 3.9  CL 99 102   < > 106 109 109  CO2 19* 20*   < > 25 24 26   GLUCOSE 208* 223*   < > 144* 86 124*  BUN 59* 54*   < > 37* 34* 32*  CREATININE 1.66* 1.55*   < > 1.44* 1.32* 1.28*  CALCIUM 8.3* 8.0*   < > 7.6* 7.9* 8.3*  PROT 7.5 7.1  --   --   --   --   ALBUMIN 2.8*  --   --   --   --   --   AST 23  --   --   --   --   --   ALT 24  --   --   --   --   --   ALKPHOS 95  --   --   --   --   --  BILITOT 0.8  --   --   --   --   --   GFRNONAA 46* 50*   < > 54* >60 >60  ANIONGAP 10 8   < > 7 6 4*   < > = values in this interval not displayed.     Hematology Recent Labs  Lab 05/29/22 1829 05/31/22 1618 06/02/22 0657  WBC 10.0 8.0 7.8  RBC 2.97* 2.79* 2.83*  HGB 9.3* 8.5* 8.7*  HCT 28.5* 27.2* 28.1*  MCV 96.0 97.5 99.3  MCH 31.3 30.5 30.7  MCHC 32.6 31.3 31.0  RDW 14.9 15.0 15.1  PLT 371 320 273    Cardiac Enzymes  Recent Labs  Lab 05/27/22 1846 05/27/22 2118  TROPONINIHS 803* 734*      BNP    Component Value Date/Time   BNP 1,547.0 (H) 05/27/2022 1851   Lipids  Lab Results  Component Value Date   CHOL 93 05/30/2022   HDL 28 (L) 05/30/2022   LDLCALC 51 05/30/2022   TRIG 72 05/30/2022   CHOLHDL 3.3 05/30/2022    HbA1c  Lab Results  Component Value Date   HGBA1C 6.7 (H) 05/21/2022   Lab Results  Component Value Date   TSH 5.605 (H) 05/30/2022     Radiology    US CHEST (PLEURAL EFFUSION)  Result Date: 05/30/2022 CLINICAL DATA:  LEFT pleural effusion EXAM: LIMITED CHEST ULTRASOUND COMPARISON:  Chest XR, 05/29/2022. IR ultrasound, 05/29/2022.  CTA chest, 05/27/2022. FINDINGS: Informed written consent was obtained from the the patient and/or patient's representative after a discussion of the risks, benefits and alternatives to treatment. A timeout was performed prior to the initiation of the procedure. Focused ultrasound along the LEFT chest demonstrates a small volume pleural effusion. The patient denied shortness of breath and given a small effusion thoracentesis was aborted. Procedure performed by Jennifer Omohundro, IR NP IMPRESSION: Small volume LEFT pleural effusion. Planned thoracentesis was aborted. Jon Mugweru, MD [facility] Electronically Signed   By: Jon  Mugweru M.D.   On: 05/30/2022 15:58   DG Chest Port 1 View  Result Date: 05/29/2022 CLINICAL DATA:  Follow-up pleural effusion. Status post right thoracentesis. EXAM: PORTABLE CHEST 1 VIEW COMPARISON:  Prior today FINDINGS: Heart size is stable. Tiny right pleural effusion is again seen. Probable tiny less than 5% right apical pneumothorax is again noted. Small left pleural effusion and left lower lobe atelectasis or consolidation show no significant change. Diffuse pulmonary interstitial prominence is suspicious for interstitial edema. IMPRESSION: Tiny right pleural effusion and probable tiny less than 5% right apical pneumothorax, without significant change. Stable small left pleural effusion and left lower lobe atelectasis or consolidation. Stable diffuse interstitial infiltrates, suspicious for edema. Electronically Signed   By: John A Stahl M.D.   On: 05/29/2022 11:18    Telemetry    RSR, 70's - Personally Reviewed  Cardiac Studies   2D Echocardiogram 12.11.2023   1. Left ventricular ejection fraction, by estimation, is 25 to 30%. The  left ventricle has severely decreased function. The left ventricle  demonstrates regional wall motion abnormalities (see scoring  diagram/findings for description). There is mild  left   ventricular hypertrophy. Left ventricular diastolic parameters are  consistent with Grade II diastolic dysfunction (pseudonormalization).  There is severe hypokinesis of the left ventricular, mid-apical  anteroseptal wall, anterior wall and apical segment.   2. Right ventricular systolic function is normal. The right ventricular  size is normal. There is moderately elevated pulmonary artery systolic    pressure. The estimated right ventricular systolic pressure is 51.3 mmHg.   3. Left atrial size was mildly dilated.   4. Moderate pleural effusion in the left lateral region.   5. The mitral valve is normal in structure. Moderate mitral valve  regurgitation. No evidence of mitral stenosis. Moderate mitral annular  calcification.   6. Tricuspid valve regurgitation is moderate.   7. The aortic valve is calcified. Aortic valve regurgitation is mild.  Mild aortic valve stenosis. Suspect at least mild aortic stenosis. An  accurate gradient was not obtained.   8. The inferior vena cava is normal in size with <50% respiratory  variability, suggesting right atrial pressure of 8 mmHg.  Patient Profile     66 y.o. 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     Assessment & Plan    1.  Acute combined syst/diast CHF:  Presented 12/9 w/ progressive dyspnea, orthopnea, PND, and chest pressure.  BNP 1547.  HsTrop 803  734.  CXR w/ CHF and bilat pl effusions.  Echo w/ new LV dysfxn (25-30%) and GrII DD.  S/p R thoracentesis of 1.2L on 12/11.  Plan for R & L Heart Cath today.  Has been responding well to IV lasix.  Minus 900ml yesterday and minus 3.2L since admission.  Wt down 0.8 kg since yesterday.  Feels well this AM, breathing cont to improve.  Still w/ 2+ RLE edema.  Renal fxn stable.  Cont current dose of IV lasix w/ plan for R and L heart cath today to determine if additional  titration necessary.  Cont ? blocker and spiro w/ plan to add arni and sglt2i post-cath.  2.  NSTEMI vs supply:demand ischemia:  HsTrop 803 on admission.  No c/p.  Cont asa/plavix/? blocker/statin.  For cath today.  The patient understands that risks include but are not limited to stroke (1 in 1000), death (1 in 1000), kidney failure [usually temporary] (1 in 500), bleeding (1 in 200), allergic reaction [possibly serious] (1 in 200), and agrees to proceed.    3.  Acute resp failure w/ hypoxia/Bilateral pleural effusions:  in setting of above w/ R>L bilateral pleural effusions s/p R thoracentesis (1.2L) on 12/11.  4.  CAP:  CT chest w/ patchy upper lobe infiltrates w/ question of PNA.  Completed course of Azithromycin and ceftriaxone.  5.  CKD II-III: Stable.  Plan to add arb/arni post-cath if stable.  6.  Normocytic anemia:  Relatively stable, though drifting down since 12/9.  8.5 on 12/13.  Repeat just now - remains stable.  7. DMII:  A1c 6.7.  Insulin per IM.  Plan to add slgt2i as outlined above.  8.  Hypothyroidism:  TSH mildly elevated on admission, likely sick euthyroid.  9.  PAD:  s/p L BKA earlier this month.  Cont asa, plavix, statin.  Signed, Cyan Moultrie, NP  06/02/2022, 10:11 AM    For questions or updates, please contact   Please consult [email or web address] for contact info under Cardiology/STEMI.  

## 2022-06-02 NOTE — Interval H&P Note (Signed)
History and Physical Interval Note:  06/02/2022 1:41 PM  [name]  has presented today for surgery, with the diagnosis of acute HFrEF and elevated troponin.  The various methods of treatment have been discussed with the patient and family. After consideration of risks, benefits and other options for treatment, the patient has consented to  Procedure(s): RIGHT/LEFT HEART CATH AND CORONARY ANGIOGRAPHY (N/A) as a surgical intervention.  The patient's history has been reviewed, patient examined, no change in status, stable for surgery.  I have reviewed the patient's chart and labs.  Questions were answered to the patient's satisfaction.    Cath Lab Visit (complete for each Cath Lab visit)  Clinical Evaluation Leading to the Procedure:   ACS: Yes.    Non-ACS:  N/A  Sad[facility] Standard

## 2022-06-02 NOTE — Progress Notes (Signed)
Cardiology Progress Note   Patient Name: [name] Date of Encounter: 06/02/2022  Primary Cardiologist: Julien Nordmann, MD  Subjective   No chest pain or sob.  For cath this AM.  Concerned about ability to mobilize post-cath related to wrist restrictions.  All questions answered, wife @ bedside.  Inpatient Medications    Scheduled Meds:  aspirin EC  81 mg Oral Daily   atorvastatin  20 mg Oral Daily   clopidogrel  75 mg Oral Daily   furosemide  60 mg Intravenous BID   insulin aspart  0-15 Units Subcutaneous Q4H   insulin detemir  5 Units Subcutaneous BID   levothyroxine  50 mcg Oral Q0600   metoprolol succinate  25 mg Oral Daily   sodium chloride flush  3 mL Intravenous Q12H   spironolactone  25 mg Oral Daily   Continuous Infusions:  sodium chloride 10 mL/hr at 05/30/22 2235   sodium chloride     PRN Meds: sodium chloride, sodium chloride, acetaminophen **OR** acetaminophen, albuterol, ALPRAZolam, alum & mag hydroxide-simeth, HYDROcodone-acetaminophen, morphine injection, ondansetron **OR** ondansetron (ZOFRAN) IV, sodium chloride flush   Vital Signs    Vitals:   06/02/22 0155 06/02/22 0426 06/02/22 0445 06/02/22 0813  BP: (!) 115/50 (!) 122/59  124/68  Pulse: 86 92  86  Resp: 18 18  18   Temp: 98.2 F (36.8 C) 98.1 F (36.7 C)  98 F (36.7 C)  TempSrc: Oral Oral    SpO2: 98% 99%  99%  Weight:   95.4 kg   Height:        Intake/Output Summary (Last 24 hours) at 06/02/2022 1011 Last data filed at 06/02/2022 0617 Gross per 24 hour  Intake 120 ml  Output 1500 ml  Net -1380 ml   Filed Weights   05/31/22 0554 06/01/22 0500 06/02/22 0445  Weight: 96.6 kg 96.2 kg 95.4 kg    Physical Exam   GEN: Well nourished, well developed, in no acute distress.  HEENT: Grossly normal.  Neck: Supple, JVD to jaw, no carotid bruits or masses. Cardiac: RRR, no murmurs, rubs, or gallops. No clubbing, cyanosis, 2+ R LE edema.  L BKA.   Respiratory:  Respirations regular  and unlabored, diminished breath sounds @ bases. GI: Soft, nontender, nondistended, BS + x 4. MS: no deformity or atrophy. Skin: warm and dry, no rash. Neuro:  Strength and sensation are intact. Psych: AAOx3.  Normal affect.  Labs    Chemistry Recent Labs  Lab 05/27/22 1846 05/28/22 0505 05/29/22 1829 05/31/22 0315 06/01/22 0358 06/02/22 0658  NA 128* 130*   < > 138 139 139  K 3.6 3.9   < > 3.4* 3.7 3.9  CL 99 102   < > 106 109 109  CO2 19* 20*   < > 25 24 26   GLUCOSE 208* 223*   < > 144* 86 124*  BUN 59* 54*   < > 37* 34* 32*  CREATININE 1.66* 1.55*   < > 1.44* 1.32* 1.28*  CALCIUM 8.3* 8.0*   < > 7.6* 7.9* 8.3*  PROT 7.5 7.1  --   --   --   --   ALBUMIN 2.8*  --   --   --   --   --   AST 23  --   --   --   --   --   ALT 24  --   --   --   --   --   ALKPHOS 95  --   --   --   --   --  BILITOT 0.8  --   --   --   --   --   GFRNONAA 46* 50*   < > 54* >60 >60  ANIONGAP 10 8   < > 7 6 4*   < > = values in this interval not displayed.     Hematology Recent Labs  Lab 05/29/22 1829 05/31/22 1618 06/02/22 0657  WBC 10.0 8.0 7.8  RBC 2.97* 2.79* 2.83*  HGB 9.3* 8.5* 8.7*  HCT 28.5* 27.2* 28.1*  MCV 96.0 97.5 99.3  MCH 31.3 30.5 30.7  MCHC 32.6 31.3 31.0  RDW 14.9 15.0 15.1  PLT 371 320 273    Cardiac Enzymes  Recent Labs  Lab 05/27/22 1846 05/27/22 2118  TROPONINIHS 803* 734*      BNP    Component Value Date/Time   BNP 1,547.0 (H) 05/27/2022 1851   Lipids  Lab Results  Component Value Date   CHOL 93 05/30/2022   HDL 28 (L) 05/30/2022   LDLCALC 51 05/30/2022   TRIG 72 05/30/2022   CHOLHDL 3.3 05/30/2022    HbA1c  Lab Results  Component Value Date   HGBA1C 6.7 (H) 05/21/2022   Lab Results  Component Value Date   TSH 5.605 (H) 05/30/2022     Radiology    [place] CHEST (PLEURAL EFFUSION)  Result Date: 05/30/2022 CLINICAL DATA:  LEFT pleural effusion EXAM: LIMITED CHEST ULTRASOUND COMPARISON:  Chest XR, 05/29/2022. IR ultrasound, 05/29/2022.  CTA chest, 05/27/2022. FINDINGS: Informed written consent was obtained from the the patient and/or patient's representative after a discussion of the risks, benefits and alternatives to treatment. A timeout was performed prior to the initiation of the procedure. Focused ultrasound along the LEFT chest demonstrates a small volume pleural effusion. The patient denied shortness of breath and given a small effusion thoracentesis was aborted. Procedure performed by Rushie Nyhan, IR NP IMPRESSION: Small volume LEFT pleural effusion. Planned thoracentesis was aborted. Michaelle Birks, MD [facility] Electronically Signed   By: Michaelle Birks M.D.   On: 05/30/2022 15:58   DG Chest Port 1 View  Result Date: 05/29/2022 CLINICAL DATA:  Follow-up pleural effusion. Status post right thoracentesis. EXAM: PORTABLE CHEST 1 VIEW COMPARISON:  Prior today FINDINGS: Heart size is stable. Tiny right pleural effusion is again seen. Probable tiny less than 5% right apical pneumothorax is again noted. Small left pleural effusion and left lower lobe atelectasis or consolidation show no significant change. Diffuse pulmonary interstitial prominence is suspicious for interstitial edema. IMPRESSION: Tiny right pleural effusion and probable tiny less than 5% right apical pneumothorax, without significant change. Stable small left pleural effusion and left lower lobe atelectasis or consolidation. Stable diffuse interstitial infiltrates, suspicious for edema. Electronically Signed   By: Marlaine Hind M.D.   On: 05/29/2022 11:18    Telemetry    RSR, 70's - Personally Reviewed  Cardiac Studies   2D Echocardiogram 12.11.2023   1. Left ventricular ejection fraction, by estimation, is 25 to 30%. The  left ventricle has severely decreased function. The left ventricle  demonstrates regional wall motion abnormalities (see scoring  diagram/findings for description). There is mild  left   ventricular hypertrophy. Left ventricular diastolic parameters are  consistent with Grade II diastolic dysfunction (pseudonormalization).  There is severe hypokinesis of the left ventricular, mid-apical  anteroseptal wall, anterior wall and apical segment.   2. Right ventricular systolic function is normal. The right ventricular  size is normal. There is moderately elevated pulmonary artery systolic  pressure. The estimated right ventricular systolic pressure is [id number] mmHg.   3. Left atrial size was mildly dilated.   4. Moderate pleural effusion in the left lateral region.   5. The mitral valve is normal in structure. Moderate mitral valve  regurgitation. No evidence of mitral stenosis. Moderate mitral annular  calcification.   6. Tricuspid valve regurgitation is moderate.   7. The aortic valve is calcified. Aortic valve regurgitation is mild.  Mild aortic valve stenosis. Suspect at least mild aortic stenosis. An  accurate gradient was not obtained.   8. The inferior vena cava is normal in size with <50% respiratory  variability, suggesting right atrial pressure of 8 mmHg.  Patient Profile     66 y.o. 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     Assessment & Plan    1.  Acute combined syst/diast CHF:  Presented 12/9 w/ progressive dyspnea, orthopnea, PND, and chest pressure.  BNP 1547.  HsTrop 803  734.  CXR w/ CHF and bilat pl effusions.  Echo w/ new LV dysfxn (25-30%) and GrII DD.  S/p R thoracentesis of 1.2L on 12/11.  Plan for R & L Heart Cath today.  Has been responding well to IV lasix.  Minus 943ml yesterday and minus 3.2L since admission.  Wt down 0.8 kg since yesterday.  Feels well this AM, breathing cont to improve.  Still w/ 2+ RLE edema.  Renal fxn stable.  Cont current dose of IV lasix w/ plan for R and L heart cath today to determine if additional  titration necessary.  Cont ? blocker and spiro w/ plan to add arni and sglt2i post-cath.  2.  NSTEMI vs supply:demand ischemia:  HsTrop 803 on admission.  No c/p.  Cont asa/plavix/? blocker/statin.  For cath today.  The patient understands that risks include but are not limited to stroke (1 in 1000), death (1 in 50), kidney failure [usually temporary] (1 in 500), bleeding (1 in 200), allergic reaction [possibly serious] (1 in 200), and agrees to proceed.    3.  Acute resp failure w/ hypoxia/Bilateral pleural effusions:  in setting of above w/ R>L bilateral pleural effusions s/p R thoracentesis (1.2L) on 12/11.  4.  CAP:  CT chest w/ patchy upper lobe infiltrates w/ question of PNA.  Completed course of Azithromycin and ceftriaxone.  5.  CKD II-III: Stable.  Plan to add arb/arni post-cath if stable.  6.  Normocytic anemia:  Relatively stable, though drifting down since 12/9.  8.5 on 12/13.  Repeat just now - remains stable.  7. DMII:  A1c 6.7.  Insulin per IM.  Plan to add slgt2i as outlined above.  8.  Hypothyroidism:  TSH mildly elevated on admission, likely sick euthyroid.  9.  PAD:  s/p L BKA earlier this month.  Cont asa, plavix, statin.  Signed, Murray Hodgkins, NP  06/02/2022, 10:11 AM    For questions or updates, please contact   Please consult [email or web address] for contact info under Cardiology/STEMI.

## 2022-06-02 NOTE — Progress Notes (Signed)
PROGRESS NOTE    [name]  FGH:[id number] DOB: [birth year] DOA: 05/27/2022 PCP: Nelwyn Salisbury, MD   Brief Narrative: [name] is a 66 y.o. male with a history of hypertension, diabetes mellitus type 2, neuropathy, PAD, hypothyroidism, gangrene/osteomyelitis s/p recent left BKA presented to the hospital with stroke alert.  Patient was noted to have acute heart failure with bilateral pleural effusion, acute hypoxemic respiratory failure.  Patient was given supplemental oxygen and IV Lasix.  Cardiology was consulted.  At this time, patient is getting IV diuretics for decompensated heart failure and awaiting for right heart cath 06/02/2022  Assessment and Plan: * Acute on chronic combined systolic and diastolic CHF (congestive heart failure) Patient presented with bilateral pleural effusion, vascular congestion with elevated BNP  of 1,547.  Cardiology on board.  2D echocardiogram shows LVEF of 25-30% with associated regional wall motion abnormalities and grade 2 diastolic dysfunction.  Continue IV Lasix, daily intake and output charting Daily weights.  On Lasix metoprolol spironolactone. Plan for right heart catheterization by cardiology on 12/14/ 2023.    Acute respiratory failure with hypoxia  Resolved.  Likely multifactorial from heart failure, pleural effusion and possible pneumonia.  CTA of the chest ruled out pulmonary embolism.   Bilateral pleural effusion Likely secondary to heart failure.  Status post right thoracocentesis 05/29/22 with transudative fluid.  Small pleural effusion on the left so no tapping was done.  On 60 mg of IV Lasix twice a day.  Demand ischemia Elevated troponin but no EKG changes or chest pain.    CAP (community acquired pneumonia) CT angio of the chest significant for patchy upper lung infiltrates consistent with possible infectious process. COVID-19 negative.  Patient did have mild leukocytosis on presentation with undetectable procalcitonin.  Blood  cultures negative. Received empiric Rocephin and Zithromax for 5 days.  Pleural fluid without any organisms.  Blood cultures negative in 5 days.  AKI (acute kidney injury) (HCC) Baseline creatinine is about 1. Creatinine of 1.66 on admission which is slightly elevated from creatinine of 1.45 from recent hospital discharge. Likely secondary to fluid overload , Vancomycin and IV contrast at recent hospitalization; IV contrast received this admission for CTA.  Creatinine today at 1.2  Metabolic acidosis-resolved   Mild hypokalemia.  Improved after replacement.  Latest potassium was 3.9.  On spironolactone.  Will closely monitor this.  Controlled type 2 diabetes mellitus with hyperglycemia, with [place]-term current use of insulin (HCC) Patient is on insulin 70/30 25 units BID as an outpatient.  Hemoglobin A1c 7 days prior to presentation at 6.7.  Currently on Levemir and sliding scale insulin.  Latest POC glucose of 130  Anemia Likely from perioperative blood loss from recent surgery.  Latest hemoglobin of 8.7.  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  Physical therapy has seen the patient today and recommend home PT on discharge.  Hypothyroidism -Continue Synthroid   DVT prophylaxis: SCDs  Code Status:   Code Status: Full Code  Family Communication:  Spoke with the patient's wife at bedside on 06/02/2022.  Disposition Plan:  Likely home with home health in 1 to 2 days.  Pending right heart cath 06/01/2022.  Consultants:  Cardiology  Procedures:  Transthoracic Echocardiogram  Antimicrobials: None currently   Subjective: Today, patient was seen and examined at bedside.  Denies any chest pain, shortness of breath, fever, chills or rigor.  Awaiting for cardiac cath.   Objective: BP 124/68 (  BP Location: Left Arm)   Pulse 86   Temp 98 F (36.7 C)   Resp 18   Ht 5\' 8"  (1.727 m)   Wt 95.4 kg    SpO2 99%   BMI 31.98 kg/m   Physical examination:  General:  Average built, alert awake and Communicative, not in obvious distress HENT:   No scleral pallor or icterus noted. Oral mucosa is moist.  Chest: Clear breath sounds noted.  No crackles or wheezes noted CVS: S1 &S2 heard.  Regular rhythm. Abdomen: Soft, nontender, nondistended.  Bowel sounds are heard.   Extremities: Left below-knee amputation, right lower extremity with pitting edema. Psych: Alert, awake and oriented, normal mood CNS:  No cranial nerve deficits.  Moves extremities. Skin: Warm and dry.    Data Reviewed: I have personally reviewed following labs and imaging studies  CBC Lab Results  Component Value Date   WBC 7.8 06/02/2022   RBC 2.83 (L) 06/02/2022   HGB 8.7 (L) 06/02/2022   HCT 28.1 (L) 06/02/2022   MCV 99.3 06/02/2022   MCH 30.7 06/02/2022   PLT 273 06/02/2022   MCHC 31.0 06/02/2022   RDW 15.1 06/02/2022   LYMPHSABS 1.6 05/27/2022   MONOABS 0.8 05/27/2022   EOSABS 0.2 05/27/2022   BASOSABS 0.1 05/27/2022     Last metabolic panel Lab Results  Component Value Date   NA 139 06/02/2022   K 3.9 06/02/2022   CL 109 06/02/2022   CO2 26 06/02/2022   BUN 32 (H) 06/02/2022   CREATININE 1.28 (H) 06/02/2022   GLUCOSE 124 (H) 06/02/2022   GFRNONAA >60 06/02/2022   GFRAA >60 01/10/2020   CALCIUM 8.3 (L) 06/02/2022   PHOS 2.7 05/18/2022   PROT 7.1 05/28/2022   ALBUMIN 2.8 (L) 05/27/2022   BILITOT 0.8 05/27/2022   ALKPHOS 95 05/27/2022   AST 23 05/27/2022   ALT 24 05/27/2022   ANIONGAP 4 (L) 06/02/2022    GFR: Estimated Creatinine Clearance: 65.3 mL/min (A) (by C-G formula based on SCr of 1.28 mg/dL (H)).  Recent Results (from the past 240 hour(s))  Resp panel by RT-PCR (RSV, Flu A&B, Covid) Anterior Nasal Swab     Status: None   Collection Time: 05/27/22  6:47 PM   Specimen: Anterior Nasal Swab  Result Value Ref Range Status   SARS Coronavirus 2 by RT PCR NEGATIVE NEGATIVE Final     Comment: (NOTE) SARS-CoV-2 target nucleic acids are NOT DETECTED.  The SARS-CoV-2 RNA is generally detectable in upper respiratory specimens during the acute phase of infection. The lowest concentration of SARS-CoV-2 viral copies this assay can detect is 138 copies/mL. A negative result does not preclude SARS-Cov-2 infection and should not be used as the sole basis for treatment or other patient management decisions. A negative result may occur with  improper specimen collection/handling, submission of specimen other than nasopharyngeal swab, presence of viral mutation(s) within the areas targeted by this assay, and inadequate number of viral copies(<138 copies/mL). A negative result must be combined with clinical observations, patient history, and epidemiological information. The expected result is Negative.  Fact Sheet for Patients:  14/09/23  Fact Sheet for Healthcare Providers:  BloggerCourse.com  This test is no t yet approved or cleared by the [place] FDA and  has been authorized for detection and/or diagnosis of SARS-CoV-2 by FDA under an Emergency Use Authorization (EUA). This EUA will remain  in effect (meaning this test can be used) for the duration of the COVID-19 declaration under Section  564(b)(1) of the Act, 21 U.S.C.section 360bbb-3(b)(1), unless the authorization is terminated  or revoked sooner.       Influenza A by PCR NEGATIVE NEGATIVE Final   Influenza B by PCR NEGATIVE NEGATIVE Final    Comment: (NOTE) The Xpert Xpress SARS-CoV-2/FLU/RSV plus assay is intended as an aid in the diagnosis of influenza from Nasopharyngeal swab specimens and should not be used as a sole basis for treatment. Nasal washings and aspirates are unacceptable for Xpert Xpress SARS-CoV-2/FLU/RSV testing.  Fact Sheet for Patients: BloggerCourse.com  Fact Sheet for Healthcare  Providers: [employer]  This test is not yet approved or cleared by the [place] FDA and has been authorized for detection and/or diagnosis of SARS-CoV-2 by FDA under an Emergency Use Authorization (EUA). This EUA will remain in effect (meaning this test can be used) for the duration of the COVID-19 declaration under Section 564(b)(1) of the Act, 21 U.S.C. section 360bbb-3(b)(1), unless the authorization is terminated or revoked.     Resp Syncytial Virus by PCR NEGATIVE NEGATIVE Final    Comment: (NOTE) Fact Sheet for Patients: BloggerCourse.com  Fact Sheet for Healthcare Providers: [employer]  This test is not yet approved or cleared by the [place] FDA and has been authorized for detection and/or diagnosis of SARS-CoV-2 by FDA under an Emergency Use Authorization (EUA). This EUA will remain in effect (meaning this test can be used) for the duration of the COVID-19 declaration under Section 564(b)(1) of the Act, 21 U.S.C. section 360bbb-3(b)(1), unless the authorization is terminated or revoked.  Performed at [facility], [address] [place], [place] [address]   Culture, blood (routine x 2)     Status: None   Collection Time: 05/27/22  9:18 PM   Specimen: BLOOD  Result Value Ref Range Status   Specimen Description BLOOD BLOOD LEFT ARM  Final   Special Requests   Final    BOTTLES DRAWN AEROBIC AND ANAEROBIC Blood Culture adequate volume   Culture   Final    NO GROWTH 5 DAYS Performed at [facility], [address] [place], [place] [address]    Report Status 06/01/2022 FINAL  Final  Culture, blood (routine x 2)     Status: None   Collection Time: 05/27/22  9:32 PM   Specimen: BLOOD RIGHT HAND  Result Value Ref Range Status   Specimen Description BLOOD RIGHT HAND  Final   Special Requests   Final    Blood Culture results may not be optimal due to an  inadequate volume of blood received in culture bottles   Culture   Final    NO GROWTH 5 DAYS Performed at [facility], [address] [place], [place] [address]    Report Status 06/01/2022 FINAL  Final  Body fluid culture w Gram Stain     Status: None   Collection Time: 05/29/22  9:15 AM   Specimen: PATH Cytology Pleural fluid  Result Value Ref Range Status   Specimen Description   Final    PLEURAL Performed at [facility], [address], [place], [place] [address]    Special Requests   Final    NONE Performed at [facility], [address] [place], [place] [address]    Gram Stain   Final    RARE WBC PRESENT, PREDOMINANTLY PMN NO ORGANISMS SEEN    Culture   Final    NO GROWTH 3 DAYS Performed at [facility], [address], [place],  [place] [id number]    Report Status 06/01/2022 FINAL  Final      Radiology Studies: No results found.    LOS: 6 days    Joycelyn DasLaxman Kay Ricciuti, MD [facility] 06/02/2022, 11:35 AM If 7PM-7AM, please contact night-coverage [email or web address]

## 2022-06-02 NOTE — Plan of Care (Signed)
  Problem: Education: Goal: Understanding of CV disease, CV risk reduction, and recovery process will improve Outcome: Progressing Goal: Individualized Educational Video(s) Outcome: Progressing   Problem: Activity: Goal: Ability to return to baseline activity level will improve Outcome: Progressing   Problem: Cardiovascular: Goal: Ability to achieve and maintain adequate cardiovascular perfusion will improve Outcome: Progressing Goal: Vascular access site(s) Level 0-1 will be maintained Outcome: Progressing   Problem: Health Behavior/Discharge Planning: Goal: Ability to safely manage health-related needs after discharge will improve Outcome: Progressing   Problem: Education: Goal: Ability to demonstrate management of disease process will improve Outcome: Progressing Goal: Ability to verbalize understanding of medication therapies will improve Outcome: Progressing Goal: Individualized Educational Video(s) Outcome: Progressing   Problem: Activity: Goal: Capacity to carry out activities will improve Outcome: Progressing   Problem: Cardiac: Goal: Ability to achieve and maintain adequate cardiopulmonary perfusion will improve Outcome: Progressing   Problem: Activity: Goal: Ability to tolerate increased activity will improve Outcome: Progressing   Problem: Clinical Measurements: Goal: Ability to maintain a body temperature in the normal range will improve Outcome: Progressing   Problem: Respiratory: Goal: Ability to maintain adequate ventilation will improve Outcome: Progressing Goal: Ability to maintain a clear airway will improve Outcome: Progressing   Problem: Education: Goal: Ability to describe self-care measures that may prevent or decrease complications (Diabetes Survival Skills Education) will improve Outcome: Progressing Goal: Individualized Educational Video(s) Outcome: Progressing   Problem: Coping: Goal: Ability to adjust to condition or change in health  will improve Outcome: Progressing   Problem: Fluid Volume: Goal: Ability to maintain a balanced intake and output will improve Outcome: Progressing   Problem: Health Behavior/Discharge Planning: Goal: Ability to identify and utilize available resources and services will improve Outcome: Progressing Goal: Ability to manage health-related needs will improve Outcome: Progressing   Problem: Metabolic: Goal: Ability to maintain appropriate glucose levels will improve Outcome: Progressing   Problem: Nutritional: Goal: Maintenance of adequate nutrition will improve Outcome: Progressing Goal: Progress toward achieving an optimal weight will improve Outcome: Progressing   Problem: Skin Integrity: Goal: Risk for impaired skin integrity will decrease Outcome: Progressing   Problem: Tissue Perfusion: Goal: Adequacy of tissue perfusion will improve Outcome: Progressing   Problem: Education: Goal: Knowledge of General Education information will improve Description: Including pain rating scale, medication(s)/side effects and non-pharmacologic comfort measures Outcome: Progressing   Problem: Health Behavior/Discharge Planning: Goal: Ability to manage health-related needs will improve Outcome: Progressing   Problem: Clinical Measurements: Goal: Ability to maintain clinical measurements within normal limits will improve Outcome: Progressing Goal: Will remain free from infection Outcome: Progressing Goal: Diagnostic test results will improve Outcome: Progressing Goal: Respiratory complications will improve Outcome: Progressing Goal: Cardiovascular complication will be avoided Outcome: Progressing   Problem: Activity: Goal: Risk for activity intolerance will decrease Outcome: Progressing   Problem: Nutrition: Goal: Adequate nutrition will be maintained Outcome: Progressing   Problem: Coping: Goal: Level of anxiety will decrease Outcome: Progressing   Problem:  Elimination: Goal: Will not experience complications related to bowel motility Outcome: Progressing Goal: Will not experience complications related to urinary retention Outcome: Progressing   Problem: Pain Managment: Goal: General experience of comfort will improve Outcome: Progressing   Problem: Safety: Goal: Ability to remain free from injury will improve Outcome: Progressing   Problem: Skin Integrity: Goal: Risk for impaired skin integrity will decrease Outcome: Progressing

## 2022-06-03 ENCOUNTER — Encounter: Payer: Self-pay | Admitting: Internal Medicine

## 2022-06-03 DIAGNOSIS — J810 Acute pulmonary edema: Secondary | ICD-10-CM

## 2022-06-03 DIAGNOSIS — I5023 Acute on chronic systolic (congestive) heart failure: Secondary | ICD-10-CM

## 2022-06-03 DIAGNOSIS — I25118 Atherosclerotic heart disease of native coronary artery with other forms of angina pectoris: Secondary | ICD-10-CM

## 2022-06-03 DIAGNOSIS — J9601 Acute respiratory failure with hypoxia: Secondary | ICD-10-CM | POA: Diagnosis not present

## 2022-06-03 DIAGNOSIS — E1165 Type 2 diabetes mellitus with hyperglycemia: Secondary | ICD-10-CM | POA: Diagnosis not present

## 2022-06-03 DIAGNOSIS — I255 Ischemic cardiomyopathy: Secondary | ICD-10-CM

## 2022-06-03 DIAGNOSIS — I739 Peripheral vascular disease, unspecified: Secondary | ICD-10-CM

## 2022-06-03 DIAGNOSIS — J90 Pleural effusion, not elsewhere classified: Secondary | ICD-10-CM | POA: Diagnosis not present

## 2022-06-03 DIAGNOSIS — I2510 Atherosclerotic heart disease of native coronary artery without angina pectoris: Secondary | ICD-10-CM

## 2022-06-03 LAB — CBC
HCT: 28.8 % — ABNORMAL LOW (ref 39.0–52.0)
Hemoglobin: 9.3 g/dL — ABNORMAL LOW (ref 13.0–17.0)
MCH: 31.6 pg (ref 26.0–34.0)
MCHC: 32.3 g/dL (ref 30.0–36.0)
MCV: 98 fL (ref 80.0–100.0)
Platelets: 243 10*3/uL (ref 150–400)
RBC: 2.94 MIL/uL — ABNORMAL LOW (ref 4.22–5.81)
RDW: 15.1 % (ref 11.5–15.5)
WBC: 8.3 10*3/uL (ref 4.0–10.5)
nRBC: 0 % (ref 0.0–0.2)

## 2022-06-03 LAB — MAGNESIUM: Magnesium: 2.3 mg/dL (ref 1.7–2.4)

## 2022-06-03 LAB — IRON AND TIBC
Iron: 41 ug/dL — ABNORMAL LOW (ref 45–182)
Saturation Ratios: 17 % — ABNORMAL LOW (ref 17.9–39.5)
TIBC: 239 ug/dL — ABNORMAL LOW (ref 250–450)
UIBC: 198 ug/dL

## 2022-06-03 LAB — BASIC METABOLIC PANEL
Anion gap: 7 (ref 5–15)
BUN: 27 mg/dL — ABNORMAL HIGH (ref 8–23)
CO2: 25 mmol/L (ref 22–32)
Calcium: 8.2 mg/dL — ABNORMAL LOW (ref 8.9–10.3)
Chloride: 105 mmol/L (ref 98–111)
Creatinine, Ser: 1.27 mg/dL — ABNORMAL HIGH (ref 0.61–1.24)
GFR, Estimated: >60 mL/min (ref >60)
Glucose, Bld: 118 mg/dL — ABNORMAL HIGH (ref 70–99)
Potassium: 4.1 mmol/L (ref 3.5–5.1)
Sodium: 137 mmol/L (ref 135–145)

## 2022-06-03 LAB — FERRITIN: Ferritin: 339 ng/mL — ABNORMAL HIGH (ref 24–336)

## 2022-06-03 LAB — GLUCOSE, CAPILLARY
Glucose-Capillary: 132 mg/dL — ABNORMAL HIGH (ref 70–99)
Glucose-Capillary: 106 mg/dL — ABNORMAL HIGH (ref 70–99)
Glucose-Capillary: 151 mg/dL — ABNORMAL HIGH (ref 70–99)
Glucose-Capillary: 175 mg/dL — ABNORMAL HIGH (ref 70–99)
Glucose-Capillary: 160 mg/dL — ABNORMAL HIGH (ref 70–99)

## 2022-06-03 MED ORDER — MELATONIN 5 MG PO TABS
5.0000 mg | ORAL_TABLET | Freq: Once | ORAL | Status: AC
  Administered 2022-06-03: 5 mg via ORAL
  Filled 2022-06-03: qty 1

## 2022-06-03 MED ORDER — DAPAGLIFLOZIN PROPANEDIOL 10 MG PO TABS
10.0000 mg | ORAL_TABLET | Freq: Every day | ORAL | Status: DC
  Administered 2022-06-03 – 2022-06-04 (×2): 10 mg via ORAL
  Filled 2022-06-03 (×2): qty 1

## 2022-06-03 NOTE — Progress Notes (Signed)
PT Cancellation Note  Patient Details Name: [name] MRN: [id number] DOB: [birth year]   Cancelled Treatment:     PT attempt. Pt was on bedpan upon arriving. He continues to endorse R wrist pain. " My doctor told me not to use the RW or put pressure on hand/wrist until after Monday. I have been doing my exercises though." Did demonstrate good ROM in LLE and does seem to correctly know what exercises to be performing. Acute PT will continue to follow and assist with OOB activity once pt is agreeable.   Rushie Chestnut 06/03/2022, 10:28 AM

## 2022-06-03 NOTE — Progress Notes (Signed)
Cardiology Progress Note   Patient Name: [name] Date of Encounter: 06/03/2022  Primary Cardiologist: Julien Nordmann, MD  Subjective   No chest pain.  Still has some orthopnea and cough when lying flat.  R wrist feels good.  Many questions about plans for this hospitalization and post-hosp f/u - all questions answered.  Inpatient Medications    Scheduled Meds:  aspirin EC  81 mg Oral Daily   atorvastatin  20 mg Oral Daily   clopidogrel  75 mg Oral Daily   dapagliflozin propanediol  10 mg Oral Daily   enoxaparin (LOVENOX) injection  40 mg Subcutaneous Q24H   furosemide  80 mg Intravenous BID   insulin aspart  0-15 Units Subcutaneous Q4H   insulin detemir  5 Units Subcutaneous BID   levothyroxine  50 mcg Oral Q0600   metoprolol succinate  25 mg Oral Daily   sodium chloride flush  3 mL Intravenous Q12H   sodium chloride flush  3 mL Intravenous Q12H   spironolactone  25 mg Oral Daily   Continuous Infusions:  sodium chloride 10 mL/hr at 05/30/22 2235   sodium chloride     PRN Meds: sodium chloride, sodium chloride, acetaminophen **OR** acetaminophen, albuterol, ALPRAZolam, alum & mag hydroxide-simeth, HYDROcodone-acetaminophen, morphine injection, ondansetron **OR** ondansetron (ZOFRAN) IV, sodium chloride flush   Vital Signs    Vitals:   06/02/22 2304 06/03/22 0321 06/03/22 0500 06/03/22 0727  BP: 112/60 123/63  (!) 100/59  Pulse: 88 88  83  Resp: 20 20  19   Temp: 98 F (36.7 C) 98.3 F (36.8 C)  98.3 F (36.8 C)  TempSrc: Oral Oral  Oral  SpO2: 99% 98%  100%  Weight:   96.4 kg   Height:        Intake/Output Summary (Last 24 hours) at 06/03/2022 0902 Last data filed at 06/03/2022 0326 Gross per 24 hour  Intake --  Output 1300 ml  Net -1300 ml   Filed Weights   06/01/22 0500 06/02/22 0445 06/03/22 0500  Weight: 96.2 kg 95.4 kg 96.4 kg    Physical Exam   GEN: Well nourished, well developed, in no acute distress.  HEENT: Grossly normal.  Neck:  Supple, mod elev JVP, no carotid bruits or masses. Cardiac: RRR, +S4.  No murmurs or rubs. No clubbing, cyanosis, 2+ RLE edema to distal posterior thigh.  Radials 2+ bilat, R DP/PT 2+. R radial cath site w/o bleeding/bruit/hematoma.  Respiratory:  Respirations regular and unlabored, diminished breath sounds @ bilat bases, otw clear to auscultation bilaterally. GI: Soft, nontender, nondistended, BS + x 4. MS: L BKA. Otw nl. Skin: warm and dry, no rash. Neuro:  Strength and sensation are intact. Psych: AAOx3.  Normal affect.  Labs    Chemistry Recent Labs  Lab 05/27/22 1846 05/28/22 0505 05/29/22 1829 06/01/22 0358 06/02/22 0658 06/02/22 1359 06/02/22 1403 06/03/22 0626  NA 128* 130*   < > 139 139 142 139 137  K 3.6 3.9   < > 3.7 3.9 3.9 3.8 4.1  CL 99 102   < > 109 109  --   --  105  CO2 19* 20*   < > 24 26  --   --  25  GLUCOSE 208* 223*   < > 86 124*  --   --  118*  BUN 59* 54*   < > 34* 32*  --   --  27*  CREATININE 1.66* 1.55*   < > 1.32* 1.28*  --   --  1.27*  CALCIUM 8.3* 8.0*   < > 7.9* 8.3*  --   --  8.2*  PROT 7.5 7.1  --   --   --   --   --   --   ALBUMIN 2.8*  --   --   --   --   --   --   --   AST 23  --   --   --   --   --   --   --   ALT 24  --   --   --   --   --   --   --   ALKPHOS 95  --   --   --   --   --   --   --   BILITOT 0.8  --   --   --   --   --   --   --   GFRNONAA 46* 50*   < > >60 >60  --   --  >60  ANIONGAP 10 8   < > 6 4*  --   --  7   < > = values in this interval not displayed.     Hematology Recent Labs  Lab 06/02/22 0657 06/02/22 1359 06/02/22 1403 06/02/22 1753 06/03/22 0626  WBC 7.8  --   --  8.9 8.3  RBC 2.83*  --   --  2.93* 2.94*  HGB 8.7*   < > 8.2* 9.2* 9.3*  HCT 28.1*   < > 24.0* 29.0* 28.8*  MCV 99.3  --   --  99.0 98.0  MCH 30.7  --   --  31.4 31.6  MCHC 31.0  --   --  31.7 32.3  RDW 15.1  --   --  15.1 15.1  PLT 273  --   --  250 243   < > = values in this interval not displayed.    Cardiac Enzymes  Recent Labs   Lab 05/27/22 1846 05/27/22 2118  TROPONINIHS 803* 734*      BNP    Component Value Date/Time   BNP 1,547.0 (H) 05/27/2022 1851   Lipids  Lab Results  Component Value Date   CHOL 93 05/30/2022   HDL 28 (L) 05/30/2022   LDLCALC 51 05/30/2022   TRIG 72 05/30/2022   CHOLHDL 3.3 05/30/2022    HbA1c  Lab Results  Component Value Date   HGBA1C 6.7 (H) 05/21/2022    Radiology    [place] CHEST (PLEURAL EFFUSION)  Result Date: 05/30/2022 CLINICAL DATA:  LEFT pleural effusion EXAM: LIMITED CHEST ULTRASOUND COMPARISON:  Chest XR, 05/29/2022. IR ultrasound, 05/29/2022. CTA chest, 05/27/2022. FINDINGS: Informed written consent was obtained from the the patient and/or patient's representative after a discussion of the risks, benefits and alternatives to treatment. A timeout was performed prior to the initiation of the procedure. Focused ultrasound along the LEFT chest demonstrates a small volume pleural effusion. The patient denied shortness of breath and given a small effusion thoracentesis was aborted. Procedure performed by Anders Grant, IR NP IMPRESSION: Small volume LEFT pleural effusion. Planned thoracentesis was aborted. Roanna Banning, MD [facility] Radiology Electronically Signed   By: Roanna Banning M.D.   On: 05/30/2022 15:58    Telemetry    RSR, 80's to 90's, brief run of SVT - Personally Reviewed  Cardiac Studies   2D Echocardiogram 12.11.2023    1. Left ventricular ejection fraction, by estimation, is 25 to  30%. The  left ventricle has severely decreased function. The left ventricle  demonstrates regional wall motion abnormalities (see scoring  diagram/findings for description). There is mild left   ventricular hypertrophy. Left ventricular diastolic parameters are  consistent with Grade II diastolic dysfunction (pseudonormalization).  There is severe hypokinesis of the left ventricular, mid-apical  anteroseptal wall,  anterior wall and apical segment.   2. Right ventricular systolic function is normal. The right ventricular  size is normal. There is moderately elevated pulmonary artery systolic  pressure. The estimated right ventricular systolic pressure is 51.3 mmHg.   3. Left atrial size was mildly dilated.   4. Moderate pleural effusion in the left lateral region.   5. The mitral valve is normal in structure. Moderate mitral valve  regurgitation. No evidence of mitral stenosis. Moderate mitral annular  calcification.   6. Tricuspid valve regurgitation is moderate.   7. The aortic valve is calcified. Aortic valve regurgitation is mild.  Mild aortic valve stenosis. Suspect at least mild aortic stenosis. An  accurate gradient was not obtained.   8. The inferior vena cava is normal in size with <50% respiratory  variability, suggesting right atrial pressure of 8 mmHg. _____________   Cardiac Catheterization  12.15.2023  Diagnostic Dominance: Co-dominant  Left Ventricle LV end diastolic pressure is moderately elevated. LVEDP 25 mmHg.  Aortic Valve There is no aortic valve stenosis.   Right Heart PressuresRA (mean): 12 mmHg RV (S/EDP): 67/12 mmHg PA (S/D, mean): 61/30 (40) mmHg PCWP (mean): 27 mmHg  Ao sat: 91% PA sat: 60%  Fick CO: 7.5 L/min Fick CI: 3.6 L/min/m^2  Thermodilution CO: 5.8 L/min Thermodilution CI: 2.8 L/min/m^2  Recommendations: Continue diuresis and escalation of goal-directed medical therapy for acute HFrEF due to ischemic cardiomyopathy. Consider outpatient cardiac surgery consultation for consideration of CABG, as coronary disease appears chronic.  Elevated troponin on admission most likely represents supply-demand mismatch in the setting of acute HFrEF and fixed coronary disease.  If the patient is not a candidate for CABG, PCI to mid LAD +/- OM2 could be considered.  Codominant RCA is likely too small for percutaneous intervention. Aggressive secondary prevention of  coronary artery disease. _____________   Patient Profile     66 y.o. 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      Assessment & Plan    1.  Acute combined syst/diast CHF:  Presented 12/9 w/ progressive dyspnea, orthopnea, PND, and chest pressure.  BNP 1547.  HsTrop 803  734.  CXR w/ CHF and bilat pl effusions.  Echo w/ new LV dysfxn (25-30%) and GrII DD.  S/p R thoracentesis of 1.2L on 12/11.  R & L heart cath on 12/15 w/ severe multivessel dzs and persistently elevated filling pressures (LVEDP 25, PCWP 27, RA 12) w/ recommendation for ongoing diuresis and outpt CT surgical eval.  He has cont to respond well to IV lasix.  Minus 1300 ml yesterday (though I note that intake not recorded) and minus 4.5 L since admission.  Wt listed as up 1 kg since yesterday.   BUN/Creat/bicarb stable.  Lasix increased to 80 IV bid post-cath. He cont to have JVD and RLE edema.  Also c/o orthopnea w/ cough.  Cont current dose of IV lasix and consider further escalation to TID dosing if renal fxn remains stable in AM.  Cont ? blocker and spiro.  BP  soft this AM, so will hold off on arb/arni for now.  Adding SGLT2i.  We discussed mgmt in the outpt setting including the importance of daily weights, sodium restriction, medication compliance, and symptom reporting and he verbalizes understanding.    2.  Supply:demand ischemia:  HsTrop 803 on admission.  No c/p.  Cath w/ severe multivessel CAD and w/o specific culprit, making demand ischemia in the setting of severe CHF more likely.  Cont asa/? blocker/statin.    Cont plavix for now, though this will eventually need to be held following CT surgical eval as outpt.   3.  Acute resp failure w/ hypoxia/Bilateral pleural effusions:  in setting of above w/ R>L bilateral pleural effusions s/p R thoracentesis (1.2L) on 12/11.   4.  CAP:  CT chest w/ patchy upper  lobe infiltrates w/ question of PNA.  Completed course of Azithromycin and ceftriaxone.  Afebrile.  Nl WBC.   5.  CKD II-III: Stable.  Stable this AM.  Cont diuresis as above.  Adding SGLT2i. Will look to add arb/arni as BP allows.   6.  Normocytic anemia:  Stable @ 9.3/28.8 this AM.   7. DMII:  A1c 6.7.  Insulin per IM.  Added farxiga today.   8.  Hypothyroidism:  TSH mildly elevated on admission, likely sick euthyroid.   9.  PAD:  s/p L BKA earlier this month.  Cont asa, plavix, statin.  Signed, Nicolasa Duckinghristopher Neizan Debruhl, NP  06/03/2022, 9:02 AM    For questions or updates, please contact   Please consult [email or web address] for contact info under Cardiology/STEMI.

## 2022-06-03 NOTE — Progress Notes (Signed)
PROGRESS NOTE    Gerald BoardDavid W Ivey  ZOX:[id number] DOB: 04/10/1956 DOA: 05/27/2022 PCP: Nelwyn SalisburyFry, Stephen A, MD   Brief Narrative: [name] is a 66 y.o. male with a history of hypertension, diabetes mellitus type 2, neuropathy, PAD, hypothyroidism, gangrene/osteomyelitis s/p recent left BKA presented to the hospital with stroke alert.  Patient was noted to have acute heart failure with bilateral pleural effusion, acute hypoxemic respiratory failure.  Patient was given supplemental oxygen and IV Lasix.  Cardiology was consulted.  At this time, patient is getting IV diuretics for decompensated heart failure and underwent right heart cath 06/02/2022  Assessment and Plan:  * Acute on chronic combined systolic and diastolic CHF (congestive heart failure) Patient presented with bilateral pleural effusion, vascular congestion with elevated BNP  of 1,547.  Cardiology on board.  2D echocardiogram shows LVEF of 25-30% with associated regional wall motion abnormalities and grade 2 diastolic dysfunction.  Continue IV Lasix, daily intake and output charting Daily weights.  On Lasix metoprolol spironolactone.  Status post right heart catheterization by cardiology on 12/15/ 2023 recommendation for medical management of heart failure and outpatient consideration of CABG.Marland Kitchen.    Acute respiratory failure with hypoxia  Resolved.  Likely multifactorial from heart failure, pleural effusion and possible pneumonia.  CTA of the chest ruled out pulmonary embolism.  Complains of mild shortness of breath.  Currently on room air.  Bilateral pleural effusion Likely secondary to heart failure.  Status post right thoracocentesis 05/29/22 with transudative fluid.  Small pleural effusion on the left so no tapping was done.  Was on 60 mg of IV Lasix twice a day.  Lasix dose has been increased to 80 mg twice a day starting today.  Will closely monitor.  Demand ischemia Elevated troponin but no EKG changes or chest pain.    CAP  (community acquired pneumonia) CT angiogram of the chest significant for patchy upper lung infiltrates consistent with possible infectious process. COVID-19 negative.  Patient did have mild leukocytosis on presentation with undetectable procalcitonin.  Blood cultures negative. Received empiric Rocephin and Zithromax for 5 days.  Pleural fluid without any organisms.  Blood cultures negative in 5 days.  AKI (acute kidney injury) (HCC) Baseline creatinine is about 1.  Creatinine today at 1.2.  Continue to monitor closely while on diuretics.  Metabolic acidosis-resolved   Mild hypokalemia.  Improved.  Latest potassium of 4.1.  Controlled type 2 diabetes mellitus with hyperglycemia, with [place]-term current use of insulin (HCC) Patient is on insulin 70/30 25 units BID as an outpatient.  Hemoglobin A1c 7 days prior to presentation at 6.7.  Currently on Levemir and sliding scale insulin.  Latest POC glucose of 151.  Anemia Likely from perioperative blood loss from recent surgery.  Latest hemoglobin of 8.7.  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  Physical therapy  recommend home PT on discharge.  Hypothyroidism Continue Synthroid   DVT prophylaxis: SCDs  Code Status:   Code Status: Full Code  Family Communication:  Spoke with the patient's wife at bedside on 06/02/2022.  Disposition Plan:  Likely home with home health in 1 to 2 days.  Follow cardiology recommendation.  Consultants:  Cardiology  Procedures:  Transthoracic Echocardiogram Right heart cath on 06/02/2022  Antimicrobials: None currently   Subjective: Today, patient was seen and examined at bedside.  Denies any chest pain, nausea, vomiting, fever, chills or rigor.  Still has some shortness of breath.  Objective: BP 125/61 (BP Location: Left Arm)   Pulse 87   Temp 98.1 F (36.7 C) (Oral)   Resp 19   Ht 5\' 8"  (1.727 m)   Wt 96.4  kg   SpO2 95%   BMI 32.31 kg/m   Physical examination:  General: Average built, alert awake and Communicative, not in obvious distress,  HENT:   No scleral pallor or icterus noted. Oral mucosa is moist.  Chest: Clear breath sounds noted.  No obvious crackles noted. CVS: S1 &S2 heard.  Regular rhythm. Abdomen: Soft, nontender, nondistended.  Bowel sounds are heard.   Extremities: Left below-knee amputation, right lower extremity with edema. Psych: Alert, awake and oriented, normal mood CNS:  No cranial nerve deficits.  Moves extremities. Skin: Warm and dry.    Data Reviewed: I have personally reviewed following labs and imaging studies  CBC Lab Results  Component Value Date   WBC 8.3 06/03/2022   RBC 2.94 (L) 06/03/2022   HGB 9.3 (L) 06/03/2022   HCT 28.8 (L) 06/03/2022   MCV 98.0 06/03/2022   MCH 31.6 06/03/2022   PLT 243 06/03/2022   MCHC 32.3 06/03/2022   RDW 15.1 06/03/2022   LYMPHSABS 1.6 05/27/2022   MONOABS 0.8 05/27/2022   EOSABS 0.2 05/27/2022   BASOSABS 0.1 05/27/2022     Last metabolic panel Lab Results  Component Value Date   NA 137 06/03/2022   K 4.1 06/03/2022   CL 105 06/03/2022   CO2 25 06/03/2022   BUN 27 (H) 06/03/2022   CREATININE 1.27 (H) 06/03/2022   GLUCOSE 118 (H) 06/03/2022   GFRNONAA >60 06/03/2022   GFRAA >60 01/10/2020   CALCIUM 8.2 (L) 06/03/2022   PHOS 2.7 05/18/2022   PROT 7.1 05/28/2022   ALBUMIN 2.8 (L) 05/27/2022   BILITOT 0.8 05/27/2022   ALKPHOS 95 05/27/2022   AST 23 05/27/2022   ALT 24 05/27/2022   ANIONGAP 7 06/03/2022    GFR: Estimated Creatinine Clearance: 66.2 mL/min (A) (by C-G formula based on SCr of 1.27 mg/dL (H)).  Recent Results (from the past 240 hour(s))  Resp panel by RT-PCR (RSV, Flu A&B, Covid) Anterior Nasal Swab     Status: None   Collection Time: 05/27/22  6:47 PM   Specimen: Anterior Nasal Swab  Result Value Ref Range Status   SARS Coronavirus 2 by RT PCR NEGATIVE NEGATIVE Final    Comment:  (NOTE) SARS-CoV-2 target nucleic acids are NOT DETECTED.  The SARS-CoV-2 RNA is generally detectable in upper respiratory specimens during the acute phase of infection. The lowest concentration of SARS-CoV-2 viral copies this assay can detect is 138 copies/mL. A negative result does not preclude SARS-Cov-2 infection and should not be used as the sole basis for treatment or other patient management decisions. A negative result may occur with  improper specimen collection/handling, submission of specimen other than nasopharyngeal swab, presence of viral mutation(s) within the areas targeted by this assay, and inadequate number of viral copies(<138 copies/mL). A negative result must be combined with clinical observations, patient history, and epidemiological information. The expected result is Negative.  Fact Sheet for Patients:  14/09/23  Fact Sheet for Healthcare Providers:  BloggerCourse.com  This test is no t yet approved or cleared by the [place] FDA and  has been authorized for detection and/or diagnosis of SARS-CoV-2 by FDA under an Emergency Use Authorization (EUA). This EUA will remain  in effect (meaning this test can be used) for the duration of the COVID-19 declaration under  Section 564(b)(1) of the Act, 21 U.S.C.section 360bbb-3(b)(1), unless the authorization is terminated  or revoked sooner.       Influenza A by PCR NEGATIVE NEGATIVE Final   Influenza B by PCR NEGATIVE NEGATIVE Final    Comment: (NOTE) The Xpert Xpress SARS-CoV-2/FLU/RSV plus assay is intended as an aid in the diagnosis of influenza from Nasopharyngeal swab specimens and should not be used as a sole basis for treatment. Nasal washings and aspirates are unacceptable for Xpert Xpress SARS-CoV-2/FLU/RSV testing.  Fact Sheet for Patients: BloggerCourse.com  Fact Sheet for Healthcare  Providers: SeriousBroker.it  This test is not yet approved or cleared by the [place] FDA and has been authorized for detection and/or diagnosis of SARS-CoV-2 by FDA under an Emergency Use Authorization (EUA). This EUA will remain in effect (meaning this test can be used) for the duration of the COVID-19 declaration under Section 564(b)(1) of the Act, 21 U.S.C. section 360bbb-3(b)(1), unless the authorization is terminated or revoked.     Resp Syncytial Virus by PCR NEGATIVE NEGATIVE Final    Comment: (NOTE) Fact Sheet for Patients: BloggerCourse.com  Fact Sheet for Healthcare Providers: [employer]ious[employer]it  This test is not yet approved or cleared by the [place] FDA and has been authorized for detection and/or diagnosis of SARS-CoV-2 by FDA under an Emergency Use Authorization (EUA). This EUA will remain in effect (meaning this test can be used) for the duration of the COVID-19 declaration under Section 564(b)(1) of the Act, 21 U.S.C. section 360bbb-3(b)(1), unless the authorization is terminated or revoked.  Performed at [facility], [address] [place], [place] [address]   Culture, blood (routine x 2)     Status: None   Collection Time: 05/27/22  9:18 PM   Specimen: BLOOD  Result Value Ref Range Status   Specimen Description BLOOD BLOOD LEFT ARM  Final   Special Requests   Final    BOTTLES DRAWN AEROBIC AND ANAEROBIC Blood Culture adequate volume   Culture   Final    NO GROWTH 5 DAYS Performed at [facility], [address], [place], [place] [address]    Report Status 06/01/2022 FINAL  Final  Culture, blood (routine x 2)     Status: None   Collection Time: 05/27/22  9:32 PM   Specimen: BLOOD RIGHT HAND  Result Value Ref Range Status   Specimen Description BLOOD RIGHT HAND  Final   Special Requests   Final    Blood Culture results may not be optimal due to an  inadequate volume of blood received in culture bottles   Culture   Final    NO GROWTH 5 DAYS Performed at [facility], [address] [place], [place] [address]    Report Status 06/01/2022 FINAL  Final  Body fluid culture w Gram Stain     Status: None   Collection Time: 05/29/22  9:15 AM   Specimen: PATH Cytology Pleural fluid  Result Value Ref Range Status   Specimen Description   Final    PLEURAL Performed at [facility], [address] [place], [place] [address]    Special Requests   Final    NONE Performed at [facility], [address] [place], [place] [address]    Gram Stain   Final    RARE WBC PRESENT, PREDOMINANTLY PMN NO ORGANISMS SEEN    Culture   Final    NO GROWTH 3 DAYS Performed at [facility], [address]  [place], [place] [address]    Report Status 06/01/2022 FINAL  Final      Radiology Studies: CARDIAC CATHETERIZATION  Result Date: 06/02/2022 Conclusions: Severe three-vessel coronary artery disease, as detailed below, including sequential 60-70% mid and distal LAD stenoses, sequential 40-60% mid and distal LCx disease, 80-90% lesion involving OM2, and diffusely diseased codominant RCA with 70-90% stenoses in the proximal/mid vessel. Moderately elevated left heart, right heart, and pulmonary artery pressures. Normal cardiac output/index. Recommendations: Continue diuresis and escalation of goal-directed medical therapy for acute HFrEF due to ischemic cardiomyopathy. Consider outpatient cardiac surgery consultation for consideration of CABG, as coronary disease appears chronic.  Elevated troponin on admission most likely represents supply-demand mismatch in the setting of acute HFrEF and fixed coronary disease.  If the patient is not a candidate for CABG, PCI to mid LAD +/- OM2 could be considered.  Codominant RCA is likely too small for percutaneous intervention. Aggressive secondary prevention of coronary artery disease.  Yvonne Kendall, MD [facility]     LOS: 7 days    Joycelyn Das, MD [facility] 06/03/2022, 12:59 PM If 7PM-7AM, please contact night-coverage [email or web address]

## 2022-06-03 NOTE — Plan of Care (Signed)
  Problem: Education: Goal: Understanding of CV disease, CV risk reduction, and recovery process will improve Outcome: Progressing Goal: Individualized Educational Video(s) Outcome: Progressing   Problem: Activity: Goal: Ability to return to baseline activity level will improve Outcome: Progressing   Problem: Cardiovascular: Goal: Ability to achieve and maintain adequate cardiovascular perfusion will improve Outcome: Progressing Goal: Vascular access site(s) Level 0-1 will be maintained Outcome: Progressing   Problem: Health Behavior/Discharge Planning: Goal: Ability to safely manage health-related needs after discharge will improve Outcome: Progressing   Problem: Education: Goal: Ability to demonstrate management of disease process will improve Outcome: Progressing Goal: Ability to verbalize understanding of medication therapies will improve Outcome: Progressing Goal: Individualized Educational Video(s) Outcome: Progressing   Problem: Activity: Goal: Capacity to carry out activities will improve Outcome: Progressing   Problem: Cardiac: Goal: Ability to achieve and maintain adequate cardiopulmonary perfusion will improve Outcome: Progressing   Problem: Activity: Goal: Ability to tolerate increased activity will improve Outcome: Progressing   Problem: Clinical Measurements: Goal: Ability to maintain a body temperature in the normal range will improve Outcome: Progressing   Problem: Respiratory: Goal: Ability to maintain adequate ventilation will improve Outcome: Progressing Goal: Ability to maintain a clear airway will improve Outcome: Progressing   Problem: Education: Goal: Ability to describe self-care measures that may prevent or decrease complications (Diabetes Survival Skills Education) will improve Outcome: Progressing Goal: Individualized Educational Video(s) Outcome: Progressing   Problem: Coping: Goal: Ability to adjust to condition or change in health  will improve Outcome: Progressing   Problem: Fluid Volume: Goal: Ability to maintain a balanced intake and output will improve Outcome: Progressing   Problem: Health Behavior/Discharge Planning: Goal: Ability to identify and utilize available resources and services will improve Outcome: Progressing Goal: Ability to manage health-related needs will improve Outcome: Progressing   Problem: Metabolic: Goal: Ability to maintain appropriate glucose levels will improve Outcome: Progressing   Problem: Nutritional: Goal: Maintenance of adequate nutrition will improve Outcome: Progressing Goal: Progress toward achieving an optimal weight will improve Outcome: Progressing   Problem: Skin Integrity: Goal: Risk for impaired skin integrity will decrease Outcome: Progressing   Problem: Tissue Perfusion: Goal: Adequacy of tissue perfusion will improve Outcome: Progressing   Problem: Education: Goal: Knowledge of General Education information will improve Description: Including pain rating scale, medication(s)/side effects and non-pharmacologic comfort measures Outcome: Progressing   Problem: Health Behavior/Discharge Planning: Goal: Ability to manage health-related needs will improve Outcome: Progressing   Problem: Clinical Measurements: Goal: Ability to maintain clinical measurements within normal limits will improve Outcome: Progressing Goal: Will remain free from infection Outcome: Progressing Goal: Diagnostic test results will improve Outcome: Progressing Goal: Respiratory complications will improve Outcome: Progressing Goal: Cardiovascular complication will be avoided Outcome: Progressing   Problem: Activity: Goal: Risk for activity intolerance will decrease Outcome: Progressing   Problem: Nutrition: Goal: Adequate nutrition will be maintained Outcome: Progressing   Problem: Coping: Goal: Level of anxiety will decrease Outcome: Progressing   Problem:  Elimination: Goal: Will not experience complications related to bowel motility Outcome: Progressing Goal: Will not experience complications related to urinary retention Outcome: Progressing   Problem: Pain Managment: Goal: General experience of comfort will improve Outcome: Progressing   Problem: Safety: Goal: Ability to remain free from injury will improve Outcome: Progressing   Problem: Skin Integrity: Goal: Risk for impaired skin integrity will decrease Outcome: Progressing   

## 2022-06-04 DIAGNOSIS — N179 Acute kidney failure, unspecified: Secondary | ICD-10-CM | POA: Diagnosis not present

## 2022-06-04 DIAGNOSIS — I5023 Acute on chronic systolic (congestive) heart failure: Secondary | ICD-10-CM | POA: Diagnosis not present

## 2022-06-04 DIAGNOSIS — J810 Acute pulmonary edema: Secondary | ICD-10-CM | POA: Diagnosis not present

## 2022-06-04 DIAGNOSIS — J90 Pleural effusion, not elsewhere classified: Secondary | ICD-10-CM | POA: Diagnosis not present

## 2022-06-04 DIAGNOSIS — I25118 Atherosclerotic heart disease of native coronary artery with other forms of angina pectoris: Secondary | ICD-10-CM | POA: Diagnosis not present

## 2022-06-04 DIAGNOSIS — E118 Type 2 diabetes mellitus with unspecified complications: Secondary | ICD-10-CM

## 2022-06-04 DIAGNOSIS — E1165 Type 2 diabetes mellitus with hyperglycemia: Secondary | ICD-10-CM | POA: Diagnosis not present

## 2022-06-04 DIAGNOSIS — I739 Peripheral vascular disease, unspecified: Secondary | ICD-10-CM | POA: Diagnosis not present

## 2022-06-04 DIAGNOSIS — I420 Dilated cardiomyopathy: Secondary | ICD-10-CM

## 2022-06-04 DIAGNOSIS — J9601 Acute respiratory failure with hypoxia: Secondary | ICD-10-CM | POA: Diagnosis not present

## 2022-06-04 LAB — BASIC METABOLIC PANEL
Anion gap: 7 (ref 5–15)
BUN: 26 mg/dL — ABNORMAL HIGH (ref 8–23)
CO2: 27 mmol/L (ref 22–32)
Calcium: 8.3 mg/dL — ABNORMAL LOW (ref 8.9–10.3)
Chloride: 103 mmol/L (ref 98–111)
Creatinine, Ser: 1.34 mg/dL — ABNORMAL HIGH (ref 0.61–1.24)
GFR, Estimated: 59 mL/min — ABNORMAL LOW (ref >60)
Glucose, Bld: 103 mg/dL — ABNORMAL HIGH (ref 70–99)
Potassium: 3.5 mmol/L (ref 3.5–5.1)
Sodium: 137 mmol/L (ref 135–145)

## 2022-06-04 LAB — CBC
HCT: 28.9 % — ABNORMAL LOW (ref 39.0–52.0)
Hemoglobin: 9.1 g/dL — ABNORMAL LOW (ref 13.0–17.0)
MCH: 31.4 pg (ref 26.0–34.0)
MCHC: 31.5 g/dL (ref 30.0–36.0)
MCV: 99.7 fL (ref 80.0–100.0)
Platelets: 224 10*3/uL (ref 150–400)
RBC: 2.9 MIL/uL — ABNORMAL LOW (ref 4.22–5.81)
RDW: 15.2 % (ref 11.5–15.5)
WBC: 7.3 10*3/uL (ref 4.0–10.5)
nRBC: 0 % (ref 0.0–0.2)

## 2022-06-04 LAB — GLUCOSE, CAPILLARY
Glucose-Capillary: 110 mg/dL — ABNORMAL HIGH (ref 70–99)
Glucose-Capillary: 155 mg/dL — ABNORMAL HIGH (ref 70–99)

## 2022-06-04 LAB — MAGNESIUM: Magnesium: 2.2 mg/dL (ref 1.7–2.4)

## 2022-06-04 MED ORDER — ATORVASTATIN CALCIUM 20 MG PO TABS: 20.0000 mg | ORAL_TABLET | Freq: Every day | ORAL | 2 refills | Status: DC

## 2022-06-04 MED ORDER — LOSARTAN POTASSIUM 25 MG PO TABS
12.5000 mg | ORAL_TABLET | Freq: Every day | ORAL | Status: DC
  Administered 2022-06-04: 12.5 mg via ORAL
  Filled 2022-06-04: qty 1

## 2022-06-04 MED ORDER — POTASSIUM CHLORIDE CRYS ER 20 MEQ PO TBCR: 20.0000 meq | EXTENDED_RELEASE_TABLET | Freq: Every day | ORAL | Status: DC

## 2022-06-04 MED ORDER — DAPAGLIFLOZIN PROPANEDIOL 10 MG PO TABS: 10.0000 mg | ORAL_TABLET | Freq: Every day | ORAL | 0 refills | Status: DC

## 2022-06-04 MED ORDER — POTASSIUM CHLORIDE CRYS ER 20 MEQ PO TBCR: 20.0000 meq | EXTENDED_RELEASE_TABLET | Freq: Every day | ORAL | 0 refills | Status: DC

## 2022-06-04 MED ORDER — LOSARTAN POTASSIUM 25 MG PO TABS: 12.5000 mg | ORAL_TABLET | Freq: Every day | ORAL | 0 refills | Status: DC

## 2022-06-04 MED ORDER — POTASSIUM CHLORIDE CRYS ER 20 MEQ PO TBCR
40.0000 meq | EXTENDED_RELEASE_TABLET | Freq: Once | ORAL | Status: AC
  Administered 2022-06-04: 40 meq via ORAL
  Filled 2022-06-04: qty 2

## 2022-06-04 MED ORDER — SPIRONOLACTONE 25 MG PO TABS: 25.0000 mg | ORAL_TABLET | Freq: Every day | ORAL | 0 refills | Status: DC

## 2022-06-04 MED ORDER — TORSEMIDE 40 MG PO TABS: 40.0000 mg | ORAL_TABLET | Freq: Every day | ORAL | 0 refills | Status: DC

## 2022-06-04 NOTE — TOC Transition Note (Addendum)
Transition of Care [facility]) - CM/SW Discharge Note   Patient Details  Name: [name] MRN: [id number] Date of Birth: Jul 19, 1955  [facility] of [facility]) CM/SW Contact:  Liliana Cline, LCSW Phone Number: 06/04/2022, 3:12 PM   Clinical Narrative:    Patient has orders to DC home today. Per notes, active with [facility]. Called Katina with [facility] and notified her of DC home today, she confirms they will continue to follow for RN, PT, and OT services. Asked MD for [facility] resumption orders.    Final next level of care: Home w Home Health Services Barriers to Discharge: Barriers Resolved   Patient Goals and CMS Choice        Discharge Placement                       Discharge Plan and Services                            [facility] Agency: [facility] Date [facility] Agency Contacted: 06/04/22   Representative spoke with at [facility] Agency: Laurelyn Sickle  Social Determinants of Health (SDOH) Interventions     Readmission Risk Interventions     No data to display

## 2022-06-04 NOTE — Progress Notes (Signed)
PT Cancellation Note  Patient Details Name: TRAYLON SCHIMMING MRN: [id number] DOB: Nov 13, 1955   Cancelled Treatment:    Reason Eval/Treat Not Completed: Other (comment)  Pt sitting up on EOB. Stating he is awaiting discharge home.  Does not have any further questions or concerns at this time and stated he feels good with mobility and safe with discharge.   Danielle Dess 06/04/2022, 12:40 PM

## 2022-06-04 NOTE — Progress Notes (Signed)
Rounding Note    Patient Name: [name] Date of Encounter: 06/04/2022  [facility] Cardiologist: Julien Nordmann, MD   Subjective   Reports feeling well, laying supine in bed with no complaints Received several doses IV Lasix, 1.6 L negative, -7 L total Reports lower extremity edema much improved, supine in bed Has indicated he would like to go home today  Inpatient Medications    Scheduled Meds:  aspirin EC  81 mg Oral Daily   atorvastatin  20 mg Oral Daily   clopidogrel  75 mg Oral Daily   dapagliflozin propanediol  10 mg Oral Daily   enoxaparin (LOVENOX) injection  40 mg Subcutaneous Q24H   furosemide  80 mg Intravenous BID   insulin aspart  0-15 Units Subcutaneous Q4H   insulin detemir  5 Units Subcutaneous BID   levothyroxine  50 mcg Oral Q0600   metoprolol succinate  25 mg Oral Daily   sodium chloride flush  3 mL Intravenous Q12H   sodium chloride flush  3 mL Intravenous Q12H   spironolactone  25 mg Oral Daily   Continuous Infusions:  sodium chloride 10 mL/hr at 05/30/22 2235   sodium chloride     PRN Meds: sodium chloride, sodium chloride, acetaminophen **OR** acetaminophen, albuterol, ALPRAZolam, alum & mag hydroxide-simeth, HYDROcodone-acetaminophen, morphine injection, ondansetron **OR** ondansetron (ZOFRAN) IV, sodium chloride flush   Vital Signs    Vitals:   06/03/22 2359 06/04/22 0500 06/04/22 0501 06/04/22 0936  BP: (!) 118/58  (!) 108/56 (!) 113/56  Pulse: 81  73 80  Resp: 18  18 18   Temp: 99 F (37.2 C)  98.1 F (36.7 C) 97.9 F (36.6 C)  TempSrc: Oral  Oral   SpO2: 98%  99% 97%  Weight:  91.9 kg    Height:        Intake/Output Summary (Last 24 hours) at 06/04/2022 1158 Last data filed at 06/04/2022 1146 Gross per 24 hour  Intake 960 ml  Output 3600 ml  Net -2640 ml      06/04/2022    5:00 AM 06/03/2022    5:00 AM 06/02/2022    4:45 AM  Last 3 Weights  Weight (lbs) 202 lb 9.6 oz 212 lb 8.4 oz 210 lb 5.1 oz  Weight  (kg) 91.9 kg 96.4 kg 95.4 kg      Telemetry    Normal sinus rhythm- Personally Reviewed  ECG     - Personally Reviewed  Physical Exam   GEN: No acute distress.   Neck: No JVD Cardiac: RRR, no murmurs, rubs, or gallops.  Respiratory: Clear to auscultation bilaterally.,  Scattered Rales and dullness at the bases GI: Soft, nontender, non-distended  MS: No edema; No deformity. Neuro:  Nonfocal  Psych: Normal affect   Labs    High Sensitivity Troponin:   Recent Labs  Lab 05/27/22 1846 05/27/22 2118  TROPONINIHS 803* 734*     Chemistry Recent Labs  Lab 06/01/22 0358 06/02/22 0658 06/02/22 1359 06/02/22 1403 06/03/22 0626 06/04/22 0537  NA 139 139   < > 139 137 137  K 3.7 3.9   < > 3.8 4.1 3.5  CL 109 109  --   --  105 103  CO2 24 26  --   --  25 27  GLUCOSE 86 124*  --   --  118* 103*  BUN 34* 32*  --   --  27* 26*  CREATININE 1.32* 1.28*  --   --  1.27* 1.34*  CALCIUM 7.9* 8.3*  --   --  8.2* 8.3*  MG 2.1  --   --   --  2.3 2.2  GFRNONAA >60 >60  --   --  >60 59*  ANIONGAP 6 4*  --   --  7 7   < > = values in this interval not displayed.    Lipids  Recent Labs  Lab 05/30/22 0517  CHOL 93  TRIG 72  HDL 28*  LDLCALC 51  CHOLHDL 3.3    Hematology Recent Labs  Lab 06/02/22 1753 06/03/22 0626 06/04/22 0537  WBC 8.9 8.3 7.3  RBC 2.93* 2.94* 2.90*  HGB 9.2* 9.3* 9.1*  HCT 29.0* 28.8* 28.9*  MCV 99.0 98.0 99.7  MCH 31.4 31.6 31.4  MCHC 31.7 32.3 31.5  RDW 15.1 15.1 15.2  PLT 250 243 224   Thyroid  Recent Labs  Lab 05/30/22 0517  TSH 5.605*    BNPNo results for input(s): "BNP", "PROBNP" in the last 168 hours.  DDimer No results for input(s): "DDIMER" in the last 168 hours.   Radiology    CARDIAC CATHETERIZATION  Result Date: 06/02/2022 Conclusions: Severe three-vessel coronary artery disease, as detailed below, including sequential 60-70% mid and distal LAD stenoses, sequential 40-60% mid and distal LCx disease, 80-90% lesion involving  OM2, and diffusely diseased codominant RCA with 70-90% stenoses in the proximal/mid vessel. Moderately elevated left heart, right heart, and pulmonary artery pressures. Normal cardiac output/index. Recommendations: Continue diuresis and escalation of goal-directed medical therapy for acute HFrEF due to ischemic cardiomyopathy. Consider outpatient cardiac surgery consultation for consideration of CABG, as coronary disease appears chronic.  Elevated troponin on admission most likely represents supply-demand mismatch in the setting of acute HFrEF and fixed coronary disease.  If the patient is not a candidate for CABG, PCI to mid LAD +/- OM2 could be considered.  Codominant RCA is likely too small for percutaneous intervention. Aggressive secondary prevention of coronary artery disease. Nelva Bush, MD [facility]   Cardiac Studies   Echocardiogram May 29, 2022   1. Left ventricular ejection fraction, by estimation, is 25 to 30%. The  left ventricle has severely decreased function. The left ventricle  demonstrates regional wall motion abnormalities (see scoring  diagram/findings for description). There is mild left   ventricular hypertrophy. Left ventricular diastolic parameters are  consistent with Grade II diastolic dysfunction (pseudonormalization).  There is severe hypokinesis of the left ventricular, mid-apical  anteroseptal wall, anterior wall and apical segment.   2. Right ventricular systolic function is normal. The right ventricular  size is normal. There is moderately elevated pulmonary artery systolic  pressure. The estimated right ventricular systolic pressure is [id number] mmHg.   3. Left atrial size was mildly dilated.   4. Moderate pleural effusion in the left lateral region.   5. The mitral valve is normal in structure. Moderate mitral valve  regurgitation. No evidence of mitral stenosis. Moderate mitral annular  calcification.   6. Tricuspid valve regurgitation is moderate.    7. The aortic valve is calcified. Aortic valve regurgitation is mild.  Mild aortic valve stenosis. Suspect at least mild aortic stenosis. An  accurate gradient was not obtained.   8. The inferior vena cava is normal in size with <50% respiratory  variability, suggesting right atrial pressure of 8 mmHg.   Patient Profile     66 y.o. 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      Assessment & Plan    Acute combined systolic and diastolic CHF Presenting with PND orthopnea leg swelling consistent with hypervolemia Echo showing ejection fraction 25 to 30%  Left and right heart catheterization performed this admission, elevated wedge pressures, three-vessel coronary disease, moderate pulmonary hypertension -Improving symptoms on IV Lasix twice daily, -7 L Continue metoprolol succinate, spironolactone Will add losartan 12.5 daily, dose noted by hypotension -He is indicated that he would like to go home today though ideally would likely benefit from an additional 24 hours diuresis -He is insisting on going home, would transition to torsemide 40 daily with potassium 20 daily  Elevated troponin  reaching 800 Severely depressed ejection fraction 25 to 30% consistent with ischemic cardiomyopathy -On aspirin Plavix, outpatient CT surgery evaluation for consideration of CABG   Respiratory failure with hypoxia, acute CHF exacerbation, improved symptoms after thoracentesis and aggressive diuresis with IV Lasix twice daily -If he stays in the hospital would continue IV Lasix additional 24 hours If he is insisting on going home with transition to torsemide 40 daily with potassium 20   Diabetes type 2, prior history of poorly controlled with complications 2 years ago A1c of 13, more recently running 6.7 -Farxiga added, low-dose losartan   PAD, left BKA Goal LDL less than  55, LDL at goal 51 Prior amputation on left lower extremity   Total encounter time more than 35 minutes  Greater than 50% was spent in counseling and coordination of care with the patient    For questions or updates, please contact [place] Please consult [email or web address] for contact info under        Signed, Ida Rogue, MD  06/04/2022, 11:58 AM

## 2022-06-04 NOTE — Plan of Care (Signed)
  Problem: Education: Goal: Understanding of CV disease, CV risk reduction, and recovery process will improve Outcome: Progressing Goal: Individualized Educational Video(s) Outcome: Progressing   Problem: Activity: Goal: Ability to return to baseline activity level will improve Outcome: Progressing   Problem: Cardiovascular: Goal: Ability to achieve and maintain adequate cardiovascular perfusion will improve Outcome: Progressing Goal: Vascular access site(s) Level 0-1 will be maintained Outcome: Progressing   Problem: Health Behavior/Discharge Planning: Goal: Ability to safely manage health-related needs after discharge will improve Outcome: Progressing   Problem: Education: Goal: Ability to demonstrate management of disease process will improve Outcome: Progressing Goal: Ability to verbalize understanding of medication therapies will improve Outcome: Progressing Goal: Individualized Educational Video(s) Outcome: Progressing   Problem: Activity: Goal: Capacity to carry out activities will improve Outcome: Progressing   Problem: Cardiac: Goal: Ability to achieve and maintain adequate cardiopulmonary perfusion will improve Outcome: Progressing   Problem: Activity: Goal: Ability to tolerate increased activity will improve Outcome: Progressing   Problem: Clinical Measurements: Goal: Ability to maintain a body temperature in the normal range will improve Outcome: Progressing   Problem: Respiratory: Goal: Ability to maintain adequate ventilation will improve Outcome: Progressing Goal: Ability to maintain a clear airway will improve Outcome: Progressing   Problem: Education: Goal: Ability to describe self-care measures that may prevent or decrease complications (Diabetes Survival Skills Education) will improve Outcome: Progressing Goal: Individualized Educational Video(s) Outcome: Progressing   Problem: Coping: Goal: Ability to adjust to condition or change in health  will improve Outcome: Progressing   Problem: Fluid Volume: Goal: Ability to maintain a balanced intake and output will improve Outcome: Progressing   Problem: Health Behavior/Discharge Planning: Goal: Ability to identify and utilize available resources and services will improve Outcome: Progressing Goal: Ability to manage health-related needs will improve Outcome: Progressing   Problem: Metabolic: Goal: Ability to maintain appropriate glucose levels will improve Outcome: Progressing   Problem: Nutritional: Goal: Maintenance of adequate nutrition will improve Outcome: Progressing Goal: Progress toward achieving an optimal weight will improve Outcome: Progressing   Problem: Skin Integrity: Goal: Risk for impaired skin integrity will decrease Outcome: Progressing   Problem: Tissue Perfusion: Goal: Adequacy of tissue perfusion will improve Outcome: Progressing   Problem: Education: Goal: Knowledge of General Education information will improve Description: Including pain rating scale, medication(s)/side effects and non-pharmacologic comfort measures Outcome: Progressing   Problem: Health Behavior/Discharge Planning: Goal: Ability to manage health-related needs will improve Outcome: Progressing   Problem: Clinical Measurements: Goal: Ability to maintain clinical measurements within normal limits will improve Outcome: Progressing Goal: Will remain free from infection Outcome: Progressing Goal: Diagnostic test results will improve Outcome: Progressing Goal: Respiratory complications will improve Outcome: Progressing Goal: Cardiovascular complication will be avoided Outcome: Progressing   Problem: Activity: Goal: Risk for activity intolerance will decrease Outcome: Progressing   Problem: Nutrition: Goal: Adequate nutrition will be maintained Outcome: Progressing   Problem: Coping: Goal: Level of anxiety will decrease Outcome: Progressing   Problem:  Elimination: Goal: Will not experience complications related to bowel motility Outcome: Progressing Goal: Will not experience complications related to urinary retention Outcome: Progressing   Problem: Pain Managment: Goal: General experience of comfort will improve Outcome: Progressing   Problem: Safety: Goal: Ability to remain free from injury will improve Outcome: Progressing   Problem: Skin Integrity: Goal: Risk for impaired skin integrity will decrease Outcome: Progressing   

## 2022-06-04 NOTE — Discharge Summary (Signed)
Physician Discharge Summary   Patient: [name] MRN: [id number] DOB: June 22, 1955  Admit date:     05/27/2022  Discharge date: 06/04/22  Discharge Physician: Flora Lipps   PCP: Laurey Morale, MD   Recommendations at discharge:   Follow-up with your primary care provider in 1 week.  Check CBC, BMP magnesium in the next visit. Follow-up with Dr. Rockey Situ Cardiology in 1 to 2 weeks/as scheduled by the clinic.  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 ([place])   HFrEF (heart failure with reduced ejection fraction) (HCC)   PAD (peripheral artery disease) ([place])   Diabetes mellitus type 2 with complications (HCC)   Elevated troponin   Coronary artery disease of native artery of native heart with stable angina pectoris (HCC)   Ischemic cardiomyopathy   Dilated cardiomyopathy ([place])  Resolved Problems:   Acute respiratory failure with hypoxia (HCC)   Pleural effusion, bilateral   CAP (community acquired pneumonia)   AKI (acute kidney injury) ([place])   Metabolic acidosis   Acute pulmonary edema [facility])  Hospital Course:  [name] is a 66 y.o. male with a history of hypertension, diabetes mellitus type 2, neuropathy, PAD, hypothyroidism, gangrene/osteomyelitis s/p recent left BKA presented to the hospital with stroke alert.  Patient was noted to have acute heart failure with bilateral pleural effusion, acute hypoxemic respiratory failure.  Patient was given supplemental oxygen and IV Lasix.  Cardiology was consulted and patient was admitted hospital for further evaluation and treatment.  Following conditions were addressed during hospitalization,  Acute on chronic combined systolic and  diastolic CHF (congestive heart failure) Patient presented with bilateral pleural effusion, vascular congestion with elevated BNP  of 1,547.  Cardiology was consulted.  2D echocardiogram showed LVEF of 25-30% with associated regional wall motion abnormalities and grade 2 diastolic dysfunction.  Patient was given IV Lasix during hospitalization and was continued on metoprolol spironolactone at home. Status post right heart catheterization by cardiology on 06/02/2022 with recommendation for medical management of heart failure and outpatient consideration of CABG or multivessel coronary artery disease.  Patient is negative balance for 7 L at this time.  Plan is torsemide 40 daily with potassium 20 meq daily on discharge as per cardiology.  Patient will be followed by cardiology as outpatient.   Acute respiratory failure with hypoxia  Resolved.  Likely multifactorial from heart failure, pleural effusion and possible pneumonia.  CTA of the chest ruled out pulmonary embolism.  Currently on room air  Bilateral pleural effusion Likely secondary to heart failure.  Status post right thoracocentesis 05/29/22 with transudative fluid.  Small pleural effusion on the left so no tapping was done.  Patient was initially on 60 mg of IV Lasix twice a day.  This will be changed to torsemide 40 daily on discharge.   Demand ischemia Elevated troponin but no EKG changes or chest pain.     CAP (community acquired pneumonia) Resolved.  CT angiogram of the chest significant for patchy upper lung infiltrates consistent with possible infectious process. COVID-19 negative.  Patient did have mild leukocytosis on presentation with undetectable procalcitonin.  Blood cultures negative. Received empiric Rocephin and Zithromax for 5 days.  Pleural fluid without any organisms.  Blood cultures negative  in 5 days.   AKI (acute kidney injury) ([place]) Baseline creatinine is about 1.  Creatinine today at 1.3.  Continue torsemide and losartan  on discharge.  Will need periodic BMP monitoring/cardiology follow-up.  Metabolic acidosis-resolved    Mild hypokalemia.  Improved.  Will continue potassium supplements on discharge.   Controlled type 2 diabetes mellitus with hyperglycemia, with [place]-term current use of insulin ([place]) Patient is on insulin 70/30, 25 units BID as an outpatient.  Hemoglobin A1c 7 days prior to presentation at 6.7.  Currently on Levemir and sliding scale insulin.  Latest POC glucose of 155.   Anemia Likely from perioperative blood loss from recent surgery.  Latest hemoglobin of 9.1.   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  Physical therapy  recommend home PT on discharge.     Hypothyroidism Continue Synthroid   Consultants: Cardiology  Procedures performed: Right heart catheterization on 06/02/2022 Disposition: Home with home health Diet recommendation:  Discharge Diet Orders (From admission, onward)     Start     Ordered   06/04/22 0000  Diet - low sodium heart healthy        06/04/22 1406   06/04/22 0000  Diet Carb Modified        06/04/22 1406           DISCHARGE MEDICATION: Allergies as of 06/04/2022   No Known Allergies      Medication List     STOP taking these medications    amLODipine 10 MG tablet Commonly known as: NORVASC   doxycycline 100 MG tablet Commonly known as: VIBRA-TABS   furosemide 20 MG tablet Commonly known as: LASIX   ibuprofen 200 MG tablet Commonly known as: ADVIL   Santyl 250 UNIT/GM ointment Generic drug: collagenase       TAKE these medications    Accu-Chek Guide Me w/Device Kit USE AS DIRECTED EVERY DAY   Accu-Chek Guide test strip Generic drug: glucose blood Use to test blood sugars twice daily.   Accu-Chek Softclix Lancets lancets daily.   acetaminophen 500 MG tablet Commonly known as: TYLENOL Take 500 mg by mouth in the morning and at  bedtime.   ALPRAZolam 0.5 MG tablet Commonly known as: XANAX TAKE 1 TABLET (0.5 MG TOTAL) BY MOUTH 3 (THREE) TIMES DAILY AS NEEDED FOR ANXIETY OR SLEEP.   aspirin EC 81 MG tablet Take 1 tablet (81 mg total) by mouth daily. Swallow whole.   atorvastatin 20 MG tablet Commonly known as: LIPITOR Take 1 tablet (20 mg total) by mouth daily. Start taking on: June 05, 2022   BD Pen Needle Micro U/F 32G X 6 MM Misc Generic drug: Insulin Pen Needle USE AS DIRECTED TO INJECT NOVILIN   clopidogrel 75 MG tablet Commonly known as: PLAVIX Take 1 tablet (75 mg total) by mouth daily.   dapagliflozin propanediol 10 MG Tabs tablet Commonly known as: FARXIGA Take 1 tablet (10 mg total) by mouth daily. Start taking on: June 05, 2022   Dexcom G7 Sensor Misc See admin instructions.   levothyroxine 50 MCG tablet Commonly known as: SYNTHROID TAKE 1 TABLET BY MOUTH EVERY DAY   losartan 25 MG tablet Commonly known as: COZAAR Take 0.5 tablets (12.5 mg total) by mouth daily. Start taking on: June 05, 2022   metoprolol succinate 25 MG 24 hr tablet Commonly known as: TOPROL-XL Take 1 tablet (25 mg total) by mouth daily.   multivitamin tablet Take 1  tablet by mouth daily.   NovoLIN 70/30 Kwikpen (70-30) 100 UNIT/ML KwikPen Generic drug: insulin isophane & regular human KwikPen Inject 25 Units into the skin 2 (two) times daily with a meal.   potassium chloride SA 20 MEQ tablet Commonly known as: KLOR-CON M Take 1 tablet (20 mEq total) by mouth daily. Start taking on: June 05, 2022   spironolactone 25 MG tablet Commonly known as: ALDACTONE Take 1 tablet (25 mg total) by mouth daily. Start taking on: June 05, 2022   temazepam 30 MG capsule Commonly known as: RESTORIL TAKE 1 CAPSULE BY MOUTH AT BEDTIME AS NEEDED FOR SLEEP   Torsemide 40 MG Tabs Take 40 mg by mouth daily.        Follow-up Information     Laurey Morale, MD Follow up in 1 week(s).   Specialty:  Family Medicine Contact information: [place] [address] [phone]         Minna Merritts, MD Follow up in 2 week(s).   Specialty: Cardiology Contact information: [address] [place]  [address] [phone]                Subjective. Today, patient was seen and examined at bedside.  Patient feels better.  No dyspnea or chest pain.  Has slept much better recently.  Wishes to go home today as soon as possible.  Discharge Exam: Filed Weights   06/02/22 0445 06/03/22 0500 06/04/22 0500  Weight: 95.4 kg 96.4 kg 91.9 kg      06/04/2022   12:38 PM 06/04/2022    9:36 AM 06/04/2022    5:01 AM  Vitals with BMI  Systolic 102 725 366  Diastolic 60 56 56  Pulse 81 80 73  General:  Average built, not in obvious distress HENT:   No scleral pallor or icterus noted. Oral mucosa is moist.  Chest:    Diminished breath sounds bilaterally. No crackles or wheezes.  CVS: S1 &S2 heard. No murmur.  Regular rate and rhythm. Abdomen: Soft, nontender, nondistended.  Bowel sounds are heard.   Extremities: Left below-knee amputation, right lower extremity with edema Psych: Alert, awake and oriented, normal mood CNS:  No cranial nerve deficits.  Power equal in all extremities.   Skin: Warm and dry.  No rashes noted.   Condition at discharge: good  The results of significant diagnostics from this hospitalization (including imaging, microbiology, ancillary and laboratory) are listed below for reference.   Imaging Studies: CARDIAC CATHETERIZATION  Result Date: 06/02/2022 Conclusions: Severe three-vessel coronary artery disease, as detailed below, including sequential 60-70% mid and distal LAD stenoses, sequential 40-60% mid and distal LCx disease, 80-90% lesion involving OM2, and diffusely diseased codominant RCA with 70-90% stenoses in the proximal/mid vessel. Moderately elevated left heart, right heart, and pulmonary artery pressures.  Normal cardiac output/index. Recommendations: Continue diuresis and escalation of goal-directed medical therapy for acute HFrEF due to ischemic cardiomyopathy. Consider outpatient cardiac surgery consultation for consideration of CABG, as coronary disease appears chronic.  Elevated troponin on admission most likely represents supply-demand mismatch in the setting of acute HFrEF and fixed coronary disease.  If the patient is not a candidate for CABG, PCI to mid LAD +/- OM2 could be considered.  Codominant RCA is likely too small for percutaneous intervention. Aggressive secondary prevention of coronary artery disease. Nelva Bush, MD [facility]  [place] CHEST (PLEURAL EFFUSION)  Result Date: 05/30/2022 CLINICAL DATA:  LEFT pleural effusion EXAM: LIMITED CHEST ULTRASOUND COMPARISON:  Chest XR, 05/29/2022. IR ultrasound, 05/29/2022. CTA chest, 05/27/2022. FINDINGS: Informed written consent was obtained from the the patient and/or patient's representative after a discussion of the risks, benefits and alternatives to treatment. A timeout was performed prior to the initiation of the procedure. Focused ultrasound along the LEFT chest demonstrates a small volume pleural effusion. The patient denied shortness of breath and given a small effusion thoracentesis was aborted. Procedure performed by Rushie Nyhan, IR NP IMPRESSION: Small volume LEFT pleural effusion. Planned thoracentesis was aborted. Michaelle Birks, MD [facility] Radiology Electronically Signed   By: Michaelle Birks M.D.   On: 05/30/2022 15:58   US THORACENTESIS ASP PLEURAL SPACE W/IMG GUIDE  Result Date: 05/29/2022 INDICATION: Pleural effusion EXAM: ULTRASOUND GUIDED RIGHT THORACENTESIS MEDICATIONS: 5 cc 1% lidocaine COMPLICATIONS: SIR Level A - No therapy, no consequence. PROCEDURE: An ultrasound guided thoracentesis was thoroughly discussed with the patient and questions answered. The benefits,  risks, alternatives and complications were also discussed. The patient understands and wishes to proceed with the procedure. Written consent was obtained. Ultrasound was performed to localize and mark an adequate pocket of fluid in the right chest. The area was then prepped and draped in the normal sterile fashion. 1% Lidocaine was used for local anesthesia. Under ultrasound guidance a 6 Fr Safe-T-Centesis catheter was introduced. Thoracentesis was performed. The catheter was removed and a dressing applied. FINDINGS: A total of approximately 1.2 L of clear yellow fluid was removed. Samples were sent to the laboratory as requested by the clinical team. IMPRESSION: Successful ultrasound guided right thoracentesis yielding 1.2 L of pleural fluid. Follow-up chest x-ray revealed "possible minimal right apical pneumothorax." Serial chest x-ray revealed no change after one hour. Read by: Reatha Armour, PA-C Electronically Signed   By: Albin Felling M.D.   On: 05/29/2022 14:40   DG Chest Port 1 View  Result Date: 05/29/2022 CLINICAL DATA:  Follow-up pleural effusion. Status post right thoracentesis. EXAM: PORTABLE CHEST 1 VIEW COMPARISON:  Prior today FINDINGS: Heart size is stable. Tiny right pleural effusion is again seen. Probable tiny less than 5% right apical pneumothorax is again noted. Small left pleural effusion and left lower lobe atelectasis or consolidation show no significant change. Diffuse pulmonary interstitial prominence is suspicious for interstitial edema. IMPRESSION: Tiny right pleural effusion and probable tiny less than 5% right apical pneumothorax, without significant change. Stable small left pleural effusion and left lower lobe atelectasis or consolidation. Stable diffuse interstitial infiltrates, suspicious for edema. Electronically Signed   By: Marlaine Hind M.D.   On: 05/29/2022 11:18   DG Chest Port 1 View  Result Date: 05/29/2022 CLINICAL DATA:  Post thoracentesis. EXAM: PORTABLE CHEST  1 VIEW COMPARISON:  Radiographs and CT 05/27/2022. FINDINGS: At 0954 hours. The heart size and mediastinal contours are stable. Right pleural effusion has significantly decreased in volume and there is improved aeration of the right lung base. There is a possible minimal right apical pneumothorax. A moderate size left pleural effusion and associated left basilar airspace disease are unchanged. Mild degenerative changes in the spine. IMPRESSION: 1. Decreased right pleural effusion following thoracentesis. Possible minimal right apical pneumothorax. 2. Stable moderate size left pleural effusion and associated left basilar airspace disease. Electronically Signed   By: Richardean Sale M.D.   On: 05/29/2022 10:12   ECHOCARDIOGRAM COMPLETE  Result Date: 05/29/2022    ECHOCARDIOGRAM REPORT   Patient Name:   ISSAAC SHIPPER Earlywine Date of Exam: 05/29/2022 Medical Rec #:  [id number]  Height:       68.0 in Accession #:    [id number]   Weight:       230.0 lb Date of Birth:  Jan 30, 1956     BSA:          2.169 m Patient Age:    66 years     BP:           Not listed in chart/Not listed in                                            chart mmHg Patient Gender: M            HR:           Not listed in chart bpm. Exam Location:  [facility] Procedure: 2D Echo, Cardiac Doppler and Color Doppler Indications:     CHF--acute diastolic S49.67  History:         Patient has prior history of Echocardiogram examinations, most                  recent 02/09/2020. Risk Factors:Hypertension and Diabetes.  Sonographer:     Sherrie Sport Referring Phys:  [id number] Athena Masse Diagnosing Phys: Kathlyn Sacramento MD  Sonographer Comments: Imnage quality was fair. IMPRESSIONS  1. Left ventricular ejection fraction, by estimation, is 25 to 30%. The left ventricle has severely decreased function. The left ventricle demonstrates regional wall motion abnormalities (see scoring diagram/findings for description). There is mild left  ventricular hypertrophy. Left ventricular  diastolic parameters are consistent with Grade II diastolic dysfunction (pseudonormalization). There is severe hypokinesis of the left ventricular, mid-apical anteroseptal wall, anterior wall and apical segment.  2. Right ventricular systolic function is normal. The right ventricular size is normal. There is moderately elevated pulmonary artery systolic pressure. The estimated right ventricular systolic pressure is 66.5 mmHg.  3. Left atrial size was mildly dilated.  4. Moderate pleural effusion in the left lateral region.  5. The mitral valve is normal in structure. Moderate mitral valve regurgitation. No evidence of mitral stenosis. Moderate mitral annular calcification.  6. Tricuspid valve regurgitation is moderate.  7. The aortic valve is calcified. Aortic valve regurgitation is mild. Mild aortic valve stenosis. Suspect at least mild aortic stenosis. An accurate gradient was not obtained.  8. The inferior vena cava is normal in size with <50% respiratory variability, suggesting right atrial pressure of 8 mmHg. Comparison(s): A prior study was performed on 02/09/2020. Prior images reviewed side by side. Changes from prior study are noted. Significant drop in EF with new wall motion abnormalities. FINDINGS  Left Ventricle: Left ventricular ejection fraction, by estimation, is 25 to 30%. The left ventricle has severely decreased function. The left ventricle demonstrates regional wall motion abnormalities. Severe hypokinesis of the left ventricular, mid-apical anteroseptal wall, anterior wall and apical segment. The left ventricular internal cavity size was normal in size. There is mild left ventricular hypertrophy. Left ventricular diastolic parameters are consistent with Grade II diastolic dysfunction (pseudonormalization). Right Ventricle: The right ventricular size is normal. No increase in right ventricular wall thickness. Right ventricular systolic function is normal. There is moderately elevated pulmonary  artery systolic pressure. The tricuspid regurgitant velocity is 3.29 m/s, and with an assumed right atrial pressure of 8 mmHg, the estimated right ventricular systolic pressure is 99.3 mmHg. Left Atrium: Left atrial size was mildly dilated. Right Atrium:  Right atrial size was normal in size. Pericardium: There is no evidence of pericardial effusion. Mitral Valve: The mitral valve is normal in structure. Moderate mitral annular calcification. Moderate mitral valve regurgitation. No evidence of mitral valve stenosis. Tricuspid Valve: The tricuspid valve is normal in structure. Tricuspid valve regurgitation is moderate . No evidence of tricuspid stenosis. Aortic Valve: The aortic valve is calcified. Aortic valve regurgitation is mild. Mild aortic stenosis is present. Aortic valve mean gradient measures 2.0 mmHg. Aortic valve peak gradient measures 4.2 mmHg. Aortic valve area, by VTI measures 2.81 cm. Pulmonic Valve: The pulmonic valve was normal in structure. Pulmonic valve regurgitation is mild. No evidence of pulmonic stenosis. Aorta: The aortic root is normal in size and structure. Venous: The inferior vena cava is normal in size with less than 50% respiratory variability, suggesting right atrial pressure of 8 mmHg. IAS/Shunts: No atrial level shunt detected by color flow Doppler. Additional Comments: There is a moderate pleural effusion in the left lateral region.  LEFT VENTRICLE PLAX 2D LVIDd:         4.90 cm      Diastology LVIDs:         4.00 cm      LV e' medial:    6.85 cm/s LV PW:         1.00 cm      LV E/e' medial:  20.6 LV IVS:        1.20 cm      LV e' lateral:   11.10 cm/s LVOT diam:     2.10 cm      LV E/e' lateral: 12.7 LV SV:         46 LV SV Index:   21 LVOT Area:     3.46 cm  LV Volumes (MOD) LV vol d, MOD A2C: 141.0 ml LV vol d, MOD A4C: 120.0 ml LV vol s, MOD A2C: 101.0 ml LV vol s, MOD A4C: 89.6 ml LV SV MOD A2C:     40.0 ml LV SV MOD A4C:     120.0 ml LV SV MOD BP:      36.4 ml RIGHT VENTRICLE  RV Basal diam:  3.00 cm RV Mid diam:    2.70 cm RV S prime:     12.60 cm/s TAPSE (M-mode): 2.0 cm LEFT ATRIUM             Index        RIGHT ATRIUM           Index LA diam:        3.90 cm 1.80 cm/m   RA Area:     11.00 cm LA Vol (A2C):   52.2 ml 24.07 ml/m  RA Volume:   26.90 ml  12.40 ml/m LA Vol (A4C):   41.6 ml 19.18 ml/m LA Biplane Vol: 47.0 ml 21.67 ml/m  AORTIC VALVE AV Area (Vmax):    2.07 cm AV Area (Vmean):   2.53 cm AV Area (VTI):     2.81 cm AV Vmax:           103.00 cm/s AV Vmean:          63.900 cm/s AV VTI:            0.165 m AV Peak Grad:      4.2 mmHg AV Mean Grad:      2.0 mmHg LVOT Vmax:         61.50 cm/s LVOT Vmean:  46.600 cm/s LVOT VTI:          0.134 m LVOT/AV VTI ratio: 0.81  AORTA Ao Root diam: 3.28 cm MITRAL VALVE                TRICUSPID VALVE MV Area (PHT): 4.68 cm     TR Peak grad:   43.3 mmHg MV Decel Time: 162 msec     TR Vmax:        329.00 cm/s MV E velocity: 141.00 cm/s MV A velocity: 83.30 cm/s   SHUNTS MV E/A ratio:  1.69         Systemic VTI:  0.13 m                             Systemic Diam: 2.10 cm Kathlyn Sacramento MD Electronically signed by Kathlyn Sacramento MD Signature Date/Time: 05/29/2022/9:28:47 AM    Final    CT Angio Chest PE W and/or Wo Contrast  Result Date: 05/27/2022 CLINICAL DATA:  SOB EXAM: CT ANGIOGRAPHY CHEST WITH CONTRAST TECHNIQUE: Multidetector CT imaging of the chest was performed using the standard protocol during bolus administration of intravenous contrast. Multiplanar CT image reconstructions and MIPs were obtained to evaluate the vascular anatomy. RADIATION DOSE REDUCTION: This exam was performed according to the departmental dose-optimization program which includes automated exposure control, adjustment of the mA and/or kV according to patient size and/or use of iterative reconstruction technique. CONTRAST:  75 mL OMNIPAQUE IOHEXOL 350 MG/ML SOLN COMPARISON:  None Available. FINDINGS: Cardiovascular: Satisfactory opacification of the  pulmonary arteries to the segmental level. No evidence of pulmonary embolism. Normal heart size. No pericardial effusion. Reflux of contrast into the IVC consistent with tricuspid insufficiency. No evidence of aortic aneurysm. Atheromatous changes noted of the aorta. Mediastinum/Nodes: 1.4 cm right hilar node. Few small mediastinal nodes. No mediastinal lesions identified. Lungs/Pleura: Large bilateral pleural effusions. Patchy alveolar process identified in the upper lobes likely an infectious process. Follow-up recommended to confirm resolution. Bibasilar, lingular and right middle lobe consolidation or atelectasis noted. Upper Abdomen: Nodular configuration liver suggests cirrhosis. Cholecystectomy clips. No adrenal lesions are identified. Musculoskeletal: Thoracic degenerative changes noted. Review of the MIP images confirms the above findings. IMPRESSION: 1. No evidence of PE. 2. Large pleural effusions. 3. Patchy nodular alveolar process upper lungs concerning for an infectious process for which follow up is recommended to confirm resolution. 4. Bibasilar atelectasis/consolidation. 5. Hilar adenopathy likely to be reactive and follow up recommended. 6. Cirrhotic appearance of the liver. Electronically Signed   By: Sammie Bench M.D.   On: 05/27/2022 20:04   DG Chest 2 View  Result Date: 05/27/2022 CLINICAL DATA:  Shortness of breath, postop hypoxia. EXAM: CHEST - 2 VIEW COMPARISON:  Chest radiograph dated FINDINGS: The heart is mildly enlarged. Pulmonary vascular congestion. Low lung volumes. Bibasilar opacities suggesting bilateral pleural effusions and/or atelectasis or infiltrate. IMPRESSION: Mild cardiomegaly with pulmonary vascular congestion. Bibasilar opacities suggesting bilateral pleural effusions and/or atelectasis or infiltrate. Electronically Signed   By: Keane Police D.O.   On: 05/27/2022 19:21   MR BRAIN WO CONTRAST  Result Date: 05/20/2022 CLINICAL DATA:  Transient ischemic attack  EXAM: MRI HEAD WITHOUT CONTRAST TECHNIQUE: Multiplanar, multiecho pulse sequences of the brain and surrounding structures were obtained without intravenous contrast. COMPARISON:  None Available. FINDINGS: Brain: No acute infarct, mass effect or extra-axial collection. No acute or chronic hemorrhage. There is multifocal hyperintense T2-weighted signal within the white matter. Parenchymal volume and  CSF spaces are normal. The midline structures are normal. Vascular: Major flow voids are preserved. Skull and upper cervical spine: Normal calvarium and skull base. Visualized upper cervical spine and soft tissues are normal. Sinuses/Orbits:No paranasal sinus fluid levels or advanced mucosal thickening. No mastoid or middle ear effusion. Normal orbits. IMPRESSION: 1. No acute intracranial abnormality. 2. Multifocal hyperintense T2-weighted signal within the white matter, nonspecific but most commonly seen in the setting of chronic small vessel ischemia. Electronically Signed   By: Ulyses Jarred M.D.   On: 05/20/2022 23:03   PERIPHERAL VASCULAR CATHETERIZATION  Result Date: 05/16/2022 See surgical note for result.  MR HEEL LEFT WO CONTRAST  Result Date: 05/16/2022 CLINICAL DATA:  Infected heel wound. EXAM: MR OF THE LEFT HEEL WITHOUT CONTRAST TECHNIQUE: Multiplanar, multisequence MR imaging of the left hindfoot was performed. No intravenous contrast was administered. COMPARISON:  Radiographs 05/15/2022 FINDINGS: There is an open wound located along the posterior aspect of the heel acute extends down close to the bone. There is underlying abnormal T1 and T2 signal intensity in the posterior aspect of the calcaneus consistent with osteomyelitis. The tibiotalar, subtalar and midfoot joint spaces are maintained. No findings suspicious for septic arthritis and no other sites of osteomyelitis. There is diffuse cellulitis and myofasciitis without definite findings for pyomyositis. The Achilles tendon is ruptured from its  distal attachment site and retracted approximately 2.5 cm. This could be pathologic and related to septic tendinopathy. The medial and lateral ankle ligaments and tendons are intact. Plantar fascia is intact. IMPRESSION: 1. 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 2. Diffuse cellulitis and myofasciitis without definite findings for focal subcutaneous abscess or pyomyositis. 3. Ruptured Achilles tendon from its distal attachment site and retracted approximately 2.5 cm. This is likely pathologic and related to septic tendinopathy. Electronically Signed   By: Marijo Sanes M.D.   On: 05/16/2022 07:36   DG Foot Complete Left  Result Date: 05/15/2022 Please see detailed radiograph report in office note.   Microbiology: Results for orders placed or performed during the hospital encounter of 05/27/22  Resp panel by RT-PCR (RSV, Flu A&B, Covid) Anterior Nasal Swab     Status: None   Collection Time: 05/27/22  6:47 PM   Specimen: Anterior Nasal Swab  Result Value Ref Range Status   SARS Coronavirus 2 by RT PCR NEGATIVE NEGATIVE Final    Comment: (NOTE) SARS-CoV-2 target nucleic acids are NOT DETECTED.  The SARS-CoV-2 RNA is generally detectable in upper respiratory specimens during the acute phase of infection. The lowest concentration of SARS-CoV-2 viral copies this assay can detect is 138 copies/mL. A negative result does not preclude SARS-Cov-2 infection and should not be used as the sole basis for treatment or other patient management decisions. A negative result may occur with  improper specimen collection/handling, submission of specimen other than nasopharyngeal swab, presence of viral mutation(s) within the areas targeted by this assay, and inadequate number of viral copies(<138 copies/mL). A negative result must be combined with clinical observations, patient history, and epidemiological information.  The expected result is Negative.  Fact Sheet for Patients:  EntrepreneurPulse.com.au  Fact Sheet for Healthcare Providers:  IncredibleEmployment.be  This test is no t yet approved or cleared by the [place] FDA and  has been authorized for detection and/or diagnosis of SARS-CoV-2 by FDA under an Emergency Use Authorization (EUA). This EUA will remain  in effect (meaning this test can be used)  for the duration of the COVID-19 declaration under Section 564(b)(1) of the Act, 21 U.S.C.section 360bbb-3(b)(1), unless the authorization is terminated  or revoked sooner.       Influenza A by PCR NEGATIVE NEGATIVE Final   Influenza B by PCR NEGATIVE NEGATIVE Final    Comment: (NOTE) The Xpert Xpress SARS-CoV-2/FLU/RSV plus assay is intended as an aid in the diagnosis of influenza from Nasopharyngeal swab specimens and should not be used as a sole basis for treatment. Nasal washings and aspirates are unacceptable for Xpert Xpress SARS-CoV-2/FLU/RSV testing.  Fact Sheet for Patients: EntrepreneurPulse.com.au  Fact Sheet for Healthcare Providers: IncredibleEmployment.be  This test is not yet approved or cleared by the [place] FDA and has been authorized for detection and/or diagnosis of SARS-CoV-2 by FDA under an Emergency Use Authorization (EUA). This EUA will remain in effect (meaning this test can be used) for the duration of the COVID-19 declaration under Section 564(b)(1) of the Act, 21 U.S.C. section 360bbb-3(b)(1), unless the authorization is terminated or revoked.     Resp Syncytial Virus by PCR NEGATIVE NEGATIVE Final    Comment: (NOTE) Fact Sheet for Patients: EntrepreneurPulse.com.au  Fact Sheet for Healthcare Providers: IncredibleEmployment.be  This test is not yet approved or cleared by the [place] FDA and has been authorized for detection and/or  diagnosis of SARS-CoV-2 by FDA under an Emergency Use Authorization (EUA). This EUA will remain in effect (meaning this test can be used) for the duration of the COVID-19 declaration under Section 564(b)(1) of the Act, 21 U.S.C. section 360bbb-3(b)(1), unless the authorization is terminated or revoked.  Performed at [facility], [address] [place], [place] [address]   Culture, blood (routine x 2)     Status: None   Collection Time: 05/27/22  9:18 PM   Specimen: BLOOD  Result Value Ref Range Status   Specimen Description BLOOD BLOOD LEFT ARM  Final   Special Requests   Final    BOTTLES DRAWN AEROBIC AND ANAEROBIC Blood Culture adequate volume   Culture   Final    NO GROWTH 5 DAYS Performed at [facility], [address] [place], [place] [address]    Report Status 06/01/2022 FINAL  Final  Culture, blood (routine x 2)     Status: None   Collection Time: 05/27/22  9:32 PM   Specimen: BLOOD RIGHT HAND  Result Value Ref Range Status   Specimen Description BLOOD RIGHT HAND  Final   Special Requests   Final    Blood Culture results may not be optimal due to an inadequate volume of blood received in culture bottles   Culture   Final    NO GROWTH 5 DAYS Performed at [facility], [address] [place],  [address]    Report Status 06/01/2022 FINAL  Final    Labs: CBC: Recent Labs  Lab 05/31/22 1618 06/02/22 0657 06/02/22 1359 06/02/22 1403 06/02/22 1753 06/03/22 0626 06/04/22 0537  WBC 8.0 7.8  --   --  8.9 8.3 7.3  HGB 8.5* 8.7* 8.8* 8.2* 9.2* 9.3* 9.1*  HCT 27.2* 28.1* 26.0* 24.0* 29.0* 28.8* 28.9*  MCV 97.5 99.3  --   --  99.0 98.0 99.7  PLT 320 273  --   --  250 243 309   Basic Metabolic Panel: Recent Labs  Lab 05/31/22 0315 06/01/22 0358 06/02/22 0658 06/02/22 1359 06/02/22 1403 06/03/22 0626 06/04/22 0537  NA 138 139 139 142 139 137 137  K 3.4* 3.7 3.9  3.9 3.8 4.1 3.5  CL 106 109 109  --   --  105 103  CO2 _0 --   --  25 27  GLUCOSE 144* 86 124*  --   --  118* 103*  BUN 37* 34* 32*  --   --  27* 26*  CREATININE 1.44* 1.32* 1.28*  --   --  1.27* 1.34*  CALCIUM 7.6* 7.9* 8.3*  --   --  8.2* 8.3*  MG  --  2.1  --   --   --  2.3 2.2   Liver Function Tests: No results for input(s): "AST", "ALT", "ALKPHOS", "BILITOT", "PROT", "ALBUMIN" in the last 168 hours. CBG: Recent Labs  Lab 06/03/22 1213 06/03/22 1605 06/03/22 2112 06/04/22 0825 06/04/22 1237  GLUCAP 151* 175* 160* 110* 155*    Discharge time spent: greater than 30 minutes.  Signed: Flora Lipps, MD [facility] 06/04/2022

## 2022-06-05 ENCOUNTER — Encounter: Payer: Self-pay | Admitting: Internal Medicine

## 2022-06-06 NOTE — Progress Notes (Signed)
Patient ID: [name], male    DOB: Oct 31, 1955, 66 y.o.   MRN: [id number]  HPI  [name] is a 66 y/o male with a history of CAD, DM, HTN, CKD, nephrolithiasis, PAD, viral meningitis, tobacco use and chronic heart failure.   Echo report from 05/29/22 showed an EF of 25-30% along with mild LVH, severe hypokinesis, moderately elevated PA pressure of 51.3 mmHg, mild LAE, moderate Gerald/TR, mild AR/AS and moderate pleural effusion in left lateral region.   RHC/LHC done 06/02/22 and showed: Severe three-vessel coronary artery disease, as detailed below, including sequential 60-70% mid and distal LAD stenoses, sequential 40-60% mid and distal LCx disease, 80-90% lesion involving OM2, and diffusely diseased codominant RCA with 70-90% stenoses in the proximal/mid vessel. Moderately elevated left heart, right heart, and pulmonary artery pressures. Normal cardiac output/index.  Admitted 05/27/22 due to shortness of breath. Echo done and IV lasix given. Cardiology consult obtained. Cath done. CTA negative for PE. Status post right thoracocentesis 05/29/22 with transudative fluid. Elevated troponin thought to be due to demand ischemia. Discharged after 8 days.   He presents today for his initial visit with a chief complaint of minimal fatigue with moderate exertion. Describes this as chronic in nature although does feel like it's improving. Has associated cough, shortness of breath and pedal edema along with this. Denies any chest pain, palpitations, abdominal distention, dizziness or weight gain.   Has recently started wearing compression sock on the right leg. 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   Not currently weighing daily as he can't figure out how to weigh as he's currently unstable on his one leg.   Some confusion about his meds as he has 2 different med lists that were given to him from his recent discharge.   Past Medical History:  Diagnosis Date   CAD (coronary artery disease)     a. 05/2022 Cath: LM nl, LAD 73m45m, 70d, D3 90, LCX 4105m60d, OM1 sev diff dzs, OM2 85p, OM2 subbranch sev diff dzs, RCA 70p/m, 9058m CHF (congestive heart failure) (HCC)    Chronic HFrEF (heart [facility] with reduced ejection fraction) ([facility]  a. 05/2022 Echo: EF 25-30%, mild LVH, GrII DD, mid-apical, anteroseptal, and apical HK. Nl RV fxn. RVSP 51.3mm70m Mildly dil LA. Mod Gerald/TR. Mild AS/AI.   Diabetes mellitus    Hematospermia    Hypertension    Ischemic cardiomyopathy    a. 05/2022 Echo: EF 25-30%.   Moderate Mitral regurgitation    Moderate tricuspid regurgitation    Nephrolithiasis 02/07/2007   PAD (peripheral artery disease) (HCC)[place] a. 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   Viral meningitis    Past Surgical History:  Procedure Laterality Date   AMPUTATION Left 05/19/2022   Procedure: AMPUTATION BELOW KNEE;  Surgeon: SchnKatha Cabal;  Location: [facility] ORS;  Service: Vascular;  Laterality: Left;   CHOLECYSTECTOMY  12-07-14   laparoscopic, per Dr. BlacRush FarmerLOWER EXTREMITY ANGIOGRAPHY Left 05/16/2022   Procedure: Lower Extremity Angiography;  Surgeon: SchnKatha Cabal;  [facility];  Service: Cardiovascular;  Laterality: Left;   RIGHT/LEFT HEART CATH AND CORONARY ANGIOGRAPHY N/A 06/02/2022   Procedure: RIGHT/LEFT HEART CATH AND CORONARY ANGIOGRAPHY;  Surgeon: End,Nelva Bush;  [facility];  Service: Cardiovascular;  Laterality: N/A;   Family History  Problem Relation Age of Onset   Rheum arthritis Mother    Diabetes Father    Heart  disease Father    Cholecystitis Sister    Cholecystitis Brother    Diabetes Brother    Diabetes Brother    Parkinson's disease Brother    Heart disease Sister    Diabetes Sister    Cholecystitis Sister    Diabetes Sister    Arthritis Other    Hypertension Other    Stroke Other    Coronary artery disease Other    Birth defects Neg Hx    Social History   Tobacco Use   Smoking status: Former    Smokeless tobacco: Never   Tobacco comments:    22 yrs ago   Substance Use Topics   Alcohol use: No    Alcohol/week: 0.0 standard drinks of alcohol   No Known Allergies Prior to Admission medications   Medication Sig Start Date End Date Taking? Authorizing Provider  ACCU-CHEK GUIDE test strip Use to test blood sugars twice daily. 05/20/21  Yes Laurey Morale, MD  Accu-Chek Softclix Lancets lancets daily. 12/31/19  Yes [provider]  acetaminophen (TYLENOL) 500 MG tablet Take 500 mg by mouth in the morning and at bedtime.   Yes [provider]  ALPRAZolam (XANAX) 0.5 MG tablet TAKE 1 TABLET (0.5 MG TOTAL) BY MOUTH 3 (THREE) TIMES DAILY AS NEEDED FOR ANXIETY OR SLEEP. 05/03/22  Yes Laurey Morale, MD  aspirin EC 81 MG tablet Take 1 tablet (81 mg total) by mouth daily. Swallow whole. 05/24/22  Yes Max Sane, MD  atorvastatin (LIPITOR) 20 MG tablet Take 1 tablet (20 mg total) by mouth daily. 06/05/22 09/03/22 Yes Pokhrel, Laxman, MD  Blood Glucose Monitoring Suppl (ACCU-CHEK GUIDE ME) w/Device KIT USE AS DIRECTED EVERY DAY 12/31/19  Yes [provider]  Continuous Blood Gluc Sensor ([place]) MISC See admin instructions. 05/02/22  Yes [provider]  dapagliflozin propanediol (FARXIGA) 10 MG TABS tablet Take 1 tablet (10 mg total) by mouth daily. 06/05/22  Yes Pokhrel, Laxman, MD  insulin isophane & regular human KwikPen (NOVOLIN 70/30 KWIKPEN) (70-30) 100 UNIT/ML KwikPen Inject 25 Units into the skin 2 (two) times daily with a meal. 02/03/22  Yes Laurey Morale, MD  Insulin Pen Needle (BD PEN NEEDLE MICRO U/F) 32G X 6 MM MISC USE AS DIRECTED TO INJECT NOVILIN 05/08/22  Yes Laurey Morale, MD  levothyroxine (SYNTHROID) 50 MCG tablet TAKE 1 TABLET BY MOUTH EVERY DAY 05/03/22  Yes Laurey Morale, MD  losartan (COZAAR) 25 MG tablet Take 0.5 tablets (12.5 mg total) by mouth daily. 06/05/22 07/05/22 Yes Pokhrel, Laxman, MD  metoprolol succinate (TOPROL-XL) 25  MG 24 hr tablet Take 1 tablet (25 mg total) by mouth daily. 08/23/21  Yes Laurey Morale, MD  Multiple Vitamin (MULTIVITAMIN) tablet Take 1 tablet by mouth daily.   Yes [provider]  potassium chloride SA (KLOR-CON M) 20 MEQ tablet Take 1 tablet (20 mEq total) by mouth daily. 06/05/22 07/05/22 Yes Pokhrel, Laxman, MD  spironolactone (ALDACTONE) 25 MG tablet Take 1 tablet (25 mg total) by mouth daily. 06/05/22  Yes Pokhrel, Laxman, MD  torsemide 40 MG TABS Take 40 mg by mouth daily. 06/04/22 07/04/22 Yes Pokhrel, Laxman, MD  clopidogrel (PLAVIX) 75 MG tablet Take 1 tablet (75 mg total) by mouth daily. Patient not taking: Reported on 06/08/2022 06/08/22   Darylene Price A, FNP  temazepam (RESTORIL) 30 MG capsule TAKE 1 CAPSULE BY MOUTH AT BEDTIME AS NEEDED FOR SLEEP Patient not taking: Reported on 06/08/2022 03/20/22   Sarajane Jews,  Ishmael Holter, MD   Review of Systems  Constitutional:  Positive for fatigue. Negative for appetite change.  HENT:  Negative for congestion, postnasal drip and sore throat.   Eyes: Negative.   Respiratory:  Positive for cough and shortness of breath (minimal). Negative for chest tightness.   Cardiovascular:  Positive for leg swelling. Negative for chest pain and palpitations.  Gastrointestinal:  Negative for abdominal distention and abdominal pain.  Endocrine: Negative.   Genitourinary: Negative.   Musculoskeletal:  Negative for back pain and neck pain.  Skin: Negative.   Allergic/Immunologic: Negative.   Neurological:  Negative for dizziness and light-headedness.  Hematological:  Negative for adenopathy. Does not bruise/bleed easily.  Psychiatric/Behavioral:  Negative for dysphoric mood and sleep disturbance (sleeping on 2.5 pillows). The patient is not nervous/anxious.    Vitals:   06/08/22 1340  BP: (!) 93/48  Pulse: 77  Resp: 18  SpO2: 100%  Weight: 202 lb (91.6 kg)   Wt Readings from Last 3 Encounters:  06/08/22 202 lb (91.6 kg)  06/04/22 202 lb 9.6 oz  (91.9 kg)  05/20/22 222 lb 7.1 oz (100.9 kg)   Lab Results  Component Value Date   CREATININE 1.34 (H) 06/04/2022   CREATININE 1.27 (H) 06/03/2022   CREATININE 1.28 (H) 06/02/2022   Physical Exam Vitals and nursing note reviewed. Exam conducted with a chaperone present (wife).  Constitutional:      Appearance: Normal appearance.  HENT:     Head: Normocephalic and atraumatic.  Cardiovascular:     Rate and Rhythm: Normal rate and regular rhythm.  Pulmonary:     Effort: Pulmonary effort is normal.     Breath sounds: No wheezing, rhonchi or rales.  Abdominal:     General: There is no distension.     Palpations: Abdomen is soft.     Tenderness: There is no abdominal tenderness.  Musculoskeletal:        General: Deformity (left BKA) present.     Cervical back: Normal range of motion and neck supple.     Right lower leg: Edema (1+ pitting) present.  Skin:    General: Skin is warm and dry.  Neurological:     General: No focal deficit present.     Mental Status: He is alert and oriented to person, place, and time.  Psychiatric:        Mood and Affect: Mood normal.        Behavior: Behavior normal.        Thought Content: Thought content normal.   Assessment & Plan:  1: Chronic heart failure with reduced ejection fraction-  - NYHA class II - euvolemic today - discussed putting digital scale on a hard chair and he could sit on that to weigh daily until he becomes more stable; instructed to call for an overnight weight gain of > 2 pounds or a weekly weight gain of > 5 pounds - not adding salt to his food - on GDMT of farxiga, losartan, metoprolol and spironolactone - current BP limits GDMT titration - drinking 3 water bottles daily (16.9 oz each) - wearing compression sock on his right leg - does elevate his leg some during the day - BNP 05/27/22 was 1547.0  2: HTN- - BP low (93/48) - sees PCP Sarajane Jews) 06/14/22; asks that I message him about doing lab work then as he has to get  to another appointment from here - BMP 06/04/22 reviewed and showed sodium 137, potassium 3.5, creatinine 1.34 & GFR 59  3: DM- - A1c 05/21/22 was 6.7% - home glucose this morning was 93  4: CAD- - on atorvastatin  - scheduled cardiology f/u for 06/23/22  5: PAD- - left BKA 05/19/22 - saw vascular (Schnier) 05/05/22; returns later today   Medication bottles reviewed.   Return in 3 weeks, sooner if needed.

## 2022-06-08 ENCOUNTER — Encounter: Payer: Self-pay | Admitting: Family

## 2022-06-08 ENCOUNTER — Ambulatory Visit (INDEPENDENT_AMBULATORY_CARE_PROVIDER_SITE_OTHER): Payer: No Typology Code available for payment source | Admitting: Nurse Practitioner

## 2022-06-08 ENCOUNTER — Ambulatory Visit: Payer: No Typology Code available for payment source | Attending: Family | Admitting: Family

## 2022-06-08 ENCOUNTER — Encounter (INDEPENDENT_AMBULATORY_CARE_PROVIDER_SITE_OTHER): Payer: Self-pay | Admitting: Nurse Practitioner

## 2022-06-08 VITALS — BP 93/48 | HR 77 | Resp 18 | Wt 202.0 lb

## 2022-06-08 VITALS — BP 96/56 | HR 76 | Resp 16

## 2022-06-08 DIAGNOSIS — J90 Pleural effusion, not elsewhere classified: Secondary | ICD-10-CM | POA: Insufficient documentation

## 2022-06-08 DIAGNOSIS — R059 Cough, unspecified: Secondary | ICD-10-CM | POA: Diagnosis not present

## 2022-06-08 DIAGNOSIS — I25118 Atherosclerotic heart disease of native coronary artery with other forms of angina pectoris: Secondary | ICD-10-CM | POA: Diagnosis not present

## 2022-06-08 DIAGNOSIS — R5383 Other fatigue: Secondary | ICD-10-CM | POA: Diagnosis present

## 2022-06-08 DIAGNOSIS — Z87891 Personal history of nicotine dependence: Secondary | ICD-10-CM | POA: Insufficient documentation

## 2022-06-08 DIAGNOSIS — I5022 Chronic systolic (congestive) heart failure: Secondary | ICD-10-CM | POA: Diagnosis not present

## 2022-06-08 DIAGNOSIS — Z89512 Acquired absence of left leg below knee: Secondary | ICD-10-CM | POA: Insufficient documentation

## 2022-06-08 DIAGNOSIS — E1151 Type 2 diabetes mellitus with diabetic peripheral angiopathy without gangrene: Secondary | ICD-10-CM | POA: Insufficient documentation

## 2022-06-08 DIAGNOSIS — I739 Peripheral vascular disease, unspecified: Secondary | ICD-10-CM

## 2022-06-08 DIAGNOSIS — E1122 Type 2 diabetes mellitus with diabetic chronic kidney disease: Secondary | ICD-10-CM | POA: Insufficient documentation

## 2022-06-08 DIAGNOSIS — I2510 Atherosclerotic heart disease of native coronary artery without angina pectoris: Secondary | ICD-10-CM | POA: Insufficient documentation

## 2022-06-08 DIAGNOSIS — Z794 Long term (current) use of insulin: Secondary | ICD-10-CM

## 2022-06-08 DIAGNOSIS — Z79899 Other long term (current) drug therapy: Secondary | ICD-10-CM | POA: Insufficient documentation

## 2022-06-08 DIAGNOSIS — R0602 Shortness of breath: Secondary | ICD-10-CM | POA: Insufficient documentation

## 2022-06-08 DIAGNOSIS — N189 Chronic kidney disease, unspecified: Secondary | ICD-10-CM | POA: Insufficient documentation

## 2022-06-08 DIAGNOSIS — E1159 Type 2 diabetes mellitus with other circulatory complications: Secondary | ICD-10-CM

## 2022-06-08 DIAGNOSIS — I10 Essential (primary) hypertension: Secondary | ICD-10-CM | POA: Diagnosis not present

## 2022-06-08 DIAGNOSIS — I130 Hypertensive heart and chronic kidney disease with heart failure and stage 1 through stage 4 chronic kidney disease, or unspecified chronic kidney disease: Secondary | ICD-10-CM | POA: Insufficient documentation

## 2022-06-08 DIAGNOSIS — Z7984 Long term (current) use of oral hypoglycemic drugs: Secondary | ICD-10-CM | POA: Diagnosis not present

## 2022-06-08 MED ORDER — CLOPIDOGREL BISULFATE 75 MG PO TABS: 75.0000 mg | ORAL_TABLET | Freq: Every day | ORAL | 3 refills | Status: DC

## 2022-06-08 NOTE — Patient Instructions (Addendum)
Start weighing daily and call for an overnight weight gain of 3 pounds or more or a weekly weight gain of more than 5 pounds.   If you have voicemail, please make sure your mailbox is cleaned out so that we may leave a message and please make sure to listen to any voicemails.    Keep daily sodium to 2000mg 

## 2022-06-09 ENCOUNTER — Encounter: Payer: Self-pay | Admitting: Family

## 2022-06-13 ENCOUNTER — Telehealth: Payer: Self-pay | Admitting: Family Medicine

## 2022-06-13 NOTE — Telephone Encounter (Signed)
Please okay these orders  ?

## 2022-06-13 NOTE — Telephone Encounter (Signed)
Ok for verbal order  °

## 2022-06-13 NOTE — Telephone Encounter (Signed)
Requesting to extend home health every other week for 9weeks for heart failure and education

## 2022-06-14 ENCOUNTER — Ambulatory Visit (INDEPENDENT_AMBULATORY_CARE_PROVIDER_SITE_OTHER): Payer: No Typology Code available for payment source | Admitting: Family Medicine

## 2022-06-14 ENCOUNTER — Encounter: Payer: Self-pay | Admitting: Family Medicine

## 2022-06-14 VITALS — BP 96/60 | HR 83 | Temp 98.2°F | Wt 202.0 lb

## 2022-06-14 DIAGNOSIS — J189 Pneumonia, unspecified organism: Secondary | ICD-10-CM | POA: Insufficient documentation

## 2022-06-14 DIAGNOSIS — E1165 Type 2 diabetes mellitus with hyperglycemia: Secondary | ICD-10-CM

## 2022-06-14 DIAGNOSIS — I25118 Atherosclerotic heart disease of native coronary artery with other forms of angina pectoris: Secondary | ICD-10-CM

## 2022-06-14 DIAGNOSIS — Z794 Long term (current) use of insulin: Secondary | ICD-10-CM

## 2022-06-14 DIAGNOSIS — I255 Ischemic cardiomyopathy: Secondary | ICD-10-CM

## 2022-06-14 DIAGNOSIS — Z89512 Acquired absence of left leg below knee: Secondary | ICD-10-CM

## 2022-06-14 DIAGNOSIS — I2489 Other forms of acute ischemic heart disease: Secondary | ICD-10-CM

## 2022-06-14 DIAGNOSIS — I739 Peripheral vascular disease, unspecified: Secondary | ICD-10-CM

## 2022-06-14 DIAGNOSIS — I5023 Acute on chronic systolic (congestive) heart failure: Secondary | ICD-10-CM | POA: Diagnosis not present

## 2022-06-14 DIAGNOSIS — I420 Dilated cardiomyopathy: Secondary | ICD-10-CM

## 2022-06-14 DIAGNOSIS — I10 Essential (primary) hypertension: Secondary | ICD-10-CM

## 2022-06-14 LAB — CBC WITH DIFFERENTIAL/PLATELET
Basophils Absolute: 0.1 10*3/uL (ref 0.0–0.1)
Basophils Relative: 1.4 % (ref 0.0–3.0)
Eosinophils Absolute: 0.3 10*3/uL (ref 0.0–0.7)
Eosinophils Relative: 3.9 % (ref 0.0–5.0)
HCT: 37.6 % — ABNORMAL LOW (ref 39.0–52.0)
Hemoglobin: 12.5 g/dL — ABNORMAL LOW (ref 13.0–17.0)
Lymphocytes Relative: 18.5 % (ref 12.0–46.0)
Lymphs Abs: 1.4 10*3/uL (ref 0.7–4.0)
MCHC: 33.4 g/dL (ref 30.0–36.0)
MCV: 96.2 fl (ref 78.0–100.0)
Monocytes Absolute: 0.6 10*3/uL (ref 0.1–1.0)
Monocytes Relative: 7.9 % (ref 3.0–12.0)
Neutro Abs: 5.1 10*3/uL (ref 1.4–7.7)
Neutrophils Relative %: 68.3 % (ref 43.0–77.0)
Platelets: 160 10*3/uL (ref 150.0–400.0)
RBC: 3.9 Mil/uL — ABNORMAL LOW (ref 4.22–5.81)
RDW: 15.7 % — ABNORMAL HIGH (ref 11.5–15.5)
WBC: 7.4 10*3/uL (ref 4.0–10.5)

## 2022-06-14 LAB — BASIC METABOLIC PANEL
BUN: 52 mg/dL — ABNORMAL HIGH (ref 6–23)
CO2: 26 mEq/L (ref 19–32)
Calcium: 9.6 mg/dL (ref 8.4–10.5)
Chloride: 99 mEq/L (ref 96–112)
Creatinine, Ser: 1.92 mg/dL — ABNORMAL HIGH (ref 0.40–1.50)
GFR: 36.37 mL/min — ABNORMAL LOW (ref >60.00)
Glucose, Bld: 140 mg/dL — ABNORMAL HIGH (ref 70–99)
Potassium: 4.8 mEq/L (ref 3.5–5.1)
Sodium: 135 mEq/L (ref 135–145)

## 2022-06-14 LAB — MAGNESIUM: Magnesium: 2.6 mg/dL — ABNORMAL HIGH (ref 1.5–2.5)

## 2022-06-14 NOTE — Progress Notes (Signed)
Subjective:    Patient ID: [name], male    DOB: 1955/12/08, 66 y.o.   MRN: [id number]  HPI Here with his wife to follow up a hospital stay from 05-27-22 to 06-04-22 for acute on chronic CHF, acute respiratory failure, AKI, and bilateral upper lobe pneumonia. He presented with extreme SOB, but he denied any chest pain. 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 An Echo revealed an EF of 20-25% with Grade II diastolic dysfunction. He was diuresed with IV Lasix.  CTA of the chest was negative for PE, but it revealed bilateral upper lung infiltrates that were presumed to be pneumonia. This was treated with IV Azithromycin and Rocephin. His creatinine on admission was 1.44 and this came down to 1.34 by DC. His diabetes has been well controlled (his AC a few weeks ago was 6.7%). The chest CT also showed large bilateral pleural effusions, and a thoracentesis on the right side showed the fluid to be transudative. Thus the effusions were felt to to be the result of the CHF. He also had a right sided heart catheterization, and this should 3 vessel disease. The decision about whether to mange this medically or surgically was left for his cardiologist to make after he went home. He was changed from Furosemide to Torsemide, and he is currently taking 40 mg daily. Since going home he has felt much better. The swelling in his right leg has resolved and he no longer has any SOB. He saw Clarisa Kindred NP in the Cardiology office on 06-08-22, and he was continued on his current regimen. He has been getting PT after the amputation, and he is being fitted for a prosthesis. He is very excited about being able to walk again. His appetite is good. He has restricted his daily fluid intake to 1500 cc.    Review of Systems  Constitutional:  Positive for fatigue. Negative for fever.  Respiratory: Negative.    Cardiovascular: Negative.   Gastrointestinal: Negative.    Genitourinary: Negative.   Neurological: Negative.        Objective:   Physical Exam Constitutional:      Appearance: He is not ill-appearing.     Comments: In a wheelchair   Cardiovascular:     Rate and Rhythm: Normal rate and regular rhythm.     Pulses: Normal pulses.     Heart sounds: Normal heart sounds.  Pulmonary:     Effort: Pulmonary effort is normal. No respiratory distress.     Breath sounds: No wheezing, rhonchi or rales.     Comments: Lung sounds are quiet at the left base  Musculoskeletal:     Right lower leg: No edema.  Neurological:     Mental Status: He is alert and oriented to person, place, and time. Mental status is at baseline.           Assessment & Plan:  He is recovering from an acute exacerbation of CHF which caused acute respiratory failure. The fluid overload has been corrected with fluid restriction and by taking Torsemide. His bilateral pneumonias appear to have resolved. His renal fiunction has returned to baseline. We will get labs today to check BMET, CBC, and Mg. He is scheduled to see Eula Listen PA in the Cardiology office on 06-23-21. His diabetes is well managed. He will continue with PT to prepare for a prosthesis. We spent a total of ( 35  ) minutes reviewing records  and discussing these issues.  Alysia Penna, MD

## 2022-06-14 NOTE — Telephone Encounter (Signed)
Left detailed message for [facility] regarding pt VO

## 2022-06-15 ENCOUNTER — Other Ambulatory Visit: Payer: Self-pay | Admitting: Family Medicine

## 2022-06-15 ENCOUNTER — Other Ambulatory Visit: Payer: Self-pay

## 2022-06-15 DIAGNOSIS — D649 Anemia, unspecified: Secondary | ICD-10-CM

## 2022-06-20 ENCOUNTER — Encounter (INDEPENDENT_AMBULATORY_CARE_PROVIDER_SITE_OTHER): Payer: Self-pay | Admitting: Nurse Practitioner

## 2022-06-20 NOTE — Progress Notes (Signed)
Subjective:    Patient ID: [name], male    DOB: 1955/11/21, 67 y.o.   MRN: [id number] Chief Complaint  Patient presents with   Routine Post Op    [facility] 2 week follow up    [name] is a 67 year old male who underwent amputation of his left lower extremity on 05/19/2022.  He had a left below-knee amputation.  This was due to an extensive nonhealing ulceration.  Since that time the patient has been doing well.  He denies any significant phantom leg pain.  The wound is healing well.  There is no evidence of infection.    Review of Systems  Skin:  Positive for wound.  Neurological:  Positive for weakness.  All other systems reviewed and are negative.      Objective:   Physical Exam Vitals reviewed.  HENT:     Head: Normocephalic.  Cardiovascular:     Rate and Rhythm: Normal rate.  Pulmonary:     Effort: Pulmonary effort is normal.  Skin:    General: Skin is warm and dry.  Neurological:     Mental Status: He is alert and oriented to person, place, and time.     Motor: Weakness present.     Gait: Gait abnormal.  Psychiatric:        Mood and Affect: Mood normal.        Behavior: Behavior normal.        Thought Content: Thought content normal.        Judgment: Judgment normal.     BP (!) 96/56 (BP Location: Left Arm)   Pulse 76   Resp 16   Past Medical History:  Diagnosis Date   CAD (coronary artery disease)    a. 05/2022 Cath: LM nl, LAD 36m63m, 70d, D3 90, LCX 450m60d, OM1 sev diff dzs, OM2 85p, OM2 subbranch sev diff dzs, RCA 70p/m, 9015m CHF (congestive heart failure) (HCC)    Chronic HFrEF (heart failure with reduced ejection fraction) [place]  a. 05/2022 Echo: EF 25-30%, mild LVH, GrII DD, mid-apical, anteroseptal, and apical HK. Nl RV fxn. RVSP 51.3mm8m Mildly dil LA. Mod MR/TR. Mild AS/AI.   Diabetes mellitus    Hematospermia    Hypertension    Ischemic cardiomyopathy    a. 05/2022 Echo: EF 25-30%.   Moderate Mitral regurgitation    Moderate  tricuspid regurgitation    Nephrolithiasis 02/07/2007   PAD (peripheral artery disease) (HCC)[place] a. 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   Viral meningitis     Social History   Socioeconomic History   Marital status: Married    Spouse name: Not on file   Number of children: 2   Years of education: Not on file   Highest education level: Not on file  Occupational History   Not on file  Tobacco Use   Smoking status: Former   Smokeless tobacco: Never   Tobacco comments:    22 yrs ago   Substance and Sexual Activity   Alcohol use: No    Alcohol/week: 0.0 standard drinks of alcohol   Drug use: No   Sexual activity: Not on file  Other Topics Concern   Not on file  Social History Narrative   Right Handed   Drinks little caffeine - only diet sodas if so   One Story Home    5 Grandchildren   Social Determinants of Health   Financial Resource Strain: Unknown (01/01/2022)   Overall Financial Resource  Strain (CARDIA)    Difficulty of Paying Living Expenses: Patient refused  Food Insecurity: Unknown (05/16/2022)   Hunger Vital Sign    Worried About Running Out of Food in the Last Year: Patient refused    [place] in the Last Year: Patient refused  Transportation Needs: Unknown (05/16/2022)   [place] - Transportation    Lack of Transportation (Medical): Patient refused    Lack of Transportation (Non-Medical): Patient refused  Physical Activity: Unknown (01/01/2022)   Exercise Vital Sign    Days of Exercise per Week: Patient refused    Minutes of Exercise per Session: Not on file  Stress: Unknown (01/01/2022)   [place] of Stress : Patient refused  Social Connections: Unknown (01/01/2022)   Social Connection and Isolation Panel [NHANES]    Frequency of Communication with Friends and Family: Patient refused    Frequency of Social Gatherings with Friends and Family: Patient refused    Attends Religious Services:  Patient refused    Active Member of Clubs or Organizations: Patient refused    Attends Archivist Meetings: Not on file    Marital Status: Patient refused  Intimate Partner Violence: Not At Risk (05/16/2022)   Humiliation, Afraid, Rape, and Kick questionnaire    Fear of Current or Ex-Partner: No    Emotionally Abused: No    Physically Abused: No    Sexually Abused: No    Past Surgical History:  Procedure Laterality Date   AMPUTATION Left 05/19/2022   Procedure: AMPUTATION BELOW KNEE;  Surgeon: Katha Cabal, MD;  Location: [facility];  Service: Vascular;  Laterality: Left;   CHOLECYSTECTOMY  12-07-14   laparoscopic, per Dr. Rush Farmer    LOWER EXTREMITY ANGIOGRAPHY Left 05/16/2022   Procedure: Lower Extremity Angiography;  Surgeon: Katha Cabal, MD;  Location: [place] [facility];  Service: Cardiovascular;  Laterality: Left;   RIGHT/LEFT HEART CATH AND CORONARY ANGIOGRAPHY N/A 06/02/2022   Procedure: RIGHT/LEFT HEART CATH AND CORONARY ANGIOGRAPHY;  Surgeon: Nelva Bush, MD;  Location: [place] [facility];  Service: Cardiovascular;  Laterality: N/A;    Family History  Problem Relation Age of Onset   Rheum arthritis Mother    Diabetes Father    Heart disease Father    Cholecystitis Sister    Cholecystitis Brother    Diabetes Brother    Diabetes Brother    Parkinson's disease Brother    Heart disease Sister    Diabetes Sister    Cholecystitis Sister    Diabetes Sister    Arthritis Other    Hypertension Other    Stroke Other    Coronary artery disease Other    Birth defects Neg Hx     No Known Allergies     Latest Ref Rng & Units 06/14/2022   11:52 AM 06/04/2022    5:37 AM 06/03/2022    6:26 AM  CBC  WBC 4.0 - 10.5 K/uL 7.4  7.3  8.3   Hemoglobin 13.0 - 17.0 g/dL 12.5  9.1  9.3   Hematocrit 39.0 - 52.0 % 37.6  28.9  28.8   Platelets 150.0 - 400.0 K/uL 160.0  224  243       CMP     Component Value Date/Time   NA 135 06/14/2022 1152   K  4.8 06/14/2022 1152   CL 99 06/14/2022 1152   CO2 26 06/14/2022 1152   GLUCOSE 140 (H) 06/14/2022 1152  BUN 52 (H) 06/14/2022 1152   CREATININE 1.92 (H) 06/14/2022 1152   CREATININE 1.31 (H) 03/15/2020 1700   CALCIUM 9.6 06/14/2022 1152   PROT 7.1 05/28/2022 0505   ALBUMIN 2.8 (L) 05/27/2022 1846   AST 23 05/27/2022 1846   ALT 24 05/27/2022 1846   ALKPHOS 95 05/27/2022 1846   BILITOT 0.8 05/27/2022 1846   GFRNONAA 59 (L) 06/04/2022 0537   GFRAA >60 01/10/2020 1107     VAS [place] ABI WITH/WO TBI  Result Date: 05/08/2022  LOWER EXTREMITY DOPPLER STUDY Patient Name:  TYMOTHY CASS  Date of Exam:   05/04/2022 Medical Rec #: [id number]     Accession #:    [id number] Date of Birth: December 06, 1955      Patient Gender: M Patient Age:   72 years Exam Location:  [facility] Procedure:      VAS [place] ABI WITH/WO TBI Referring Phys: Leotis Pain --------------------------------------------------------------------------------  Indications: Ulceration. left heel x 10 months  Comparison Study: 08/18/2021 Performing Technologist: Concha Norway RVT  Examination Guidelines: A complete evaluation includes at minimum, Doppler waveform signals and systolic blood pressure reading at the level of bilateral brachial, anterior tibial, and posterior tibial arteries, when vessel segments are accessible. Bilateral testing is considered an integral part of a complete examination. Photoelectric Plethysmograph (PPG) waveforms and toe systolic pressure readings are included as required and additional duplex testing as needed. Limited examinations for reoccurring indications may be performed as noted.  ABI Findings: +---------+------------------+-----+----------+--------+ Right    Rt Pressure (mmHg)IndexWaveform  Comment  +---------+------------------+-----+----------+--------+ Brachial 184                                       +---------+------------------+-----+----------+--------+ ATA      176               0.94  monophasic         +---------+------------------+-----+----------+--------+ PTA      188               1.01 biphasic           +---------+------------------+-----+----------+--------+ Great Toe132               0.71 Normal             +---------+------------------+-----+----------+--------+ +---------+------------------+-----+--------+-------+ Left     Lt Pressure (mmHg)IndexWaveformComment +---------+------------------+-----+--------+-------+ Brachial 187                                    +---------+------------------+-----+--------+-------+ ATA      183               0.98 biphasic        +---------+------------------+-----+--------+-------+ PTA      182               0.97 biphasic        +---------+------------------+-----+--------+-------+ Great Toe101               0.54 Abnormal        +---------+------------------+-----+--------+-------+ +-------+-----------+-----------+------------+------------+ ABI/TBIToday's ABIToday's TBIPrevious ABIPrevious TBI +-------+-----------+-----------+------------+------------+ Right  1.01       .71        1.05        .50          +-------+-----------+-----------+------------+------------+ Left   .98        .54        .  98         .51          +-------+-----------+-----------+------------+------------+ Right TBIs appear increased compared to prior study on 08/2021. Left ABIs and TBIs appear essentially unchanged.  Summary: Right: Resting right ankle-brachial index is within normal range. The right toe-brachial index is normal. Left: Resting left ankle-brachial index is within normal range. The left toe-brachial index is abnormal. *See table(s) above for measurements and observations.  Electronically signed by Hortencia Pilar MD on 05/08/2022 at 4:58:27 PM.    Final        Assessment & Plan:   1. S/P BKA (below knee amputation) unilateral, left 05/19/2022 ([place]) The patient's wound is healing very well.  It is still  slightly early for staple removal.  Will have her return in 1 to 2 weeks for staple removal.   Current Outpatient Medications on File Prior to Visit  Medication Sig Dispense Refill   Accu-Chek Softclix Lancets lancets daily.     acetaminophen (TYLENOL) 500 MG tablet Take 500 mg by mouth in the morning and at bedtime.     ALPRAZolam (XANAX) 0.5 MG tablet TAKE 1 TABLET (0.5 MG TOTAL) BY MOUTH 3 (THREE) TIMES DAILY AS NEEDED FOR ANXIETY OR SLEEP. 270 tablet 1   aspirin EC 81 MG tablet Take 1 tablet (81 mg total) by mouth daily. Swallow whole. 30 tablet 12   atorvastatin (LIPITOR) 20 MG tablet Take 1 tablet (20 mg total) by mouth daily. 30 tablet 2   Blood Glucose Monitoring Suppl (ACCU-CHEK GUIDE ME) w/Device KIT USE AS DIRECTED EVERY DAY     Continuous Blood Gluc Sensor (DEXCOM G7 SENSOR) MISC See admin instructions.     dapagliflozin propanediol (FARXIGA) 10 MG TABS tablet Take 1 tablet (10 mg total) by mouth daily. 30 tablet 0   insulin isophane & regular human KwikPen (NOVOLIN 70/30 KWIKPEN) (70-30) 100 UNIT/ML KwikPen Inject 25 Units into the skin 2 (two) times daily with a meal. 300 mL 3   Insulin Pen Needle (BD PEN NEEDLE MICRO U/F) 32G X 6 MM MISC USE AS DIRECTED TO INJECT NOVILIN 200 each 1   levothyroxine (SYNTHROID) 50 MCG tablet TAKE 1 TABLET BY MOUTH EVERY DAY 90 tablet 0   losartan (COZAAR) 25 MG tablet Take 0.5 tablets (12.5 mg total) by mouth daily. 15 tablet 0   metoprolol succinate (TOPROL-XL) 25 MG 24 hr tablet Take 1 tablet (25 mg total) by mouth daily. 90 tablet 3   Multiple Vitamin (MULTIVITAMIN) tablet Take 1 tablet by mouth daily.     potassium chloride SA (KLOR-CON M) 20 MEQ tablet Take 1 tablet (20 mEq total) by mouth daily. 30 tablet 0   spironolactone (ALDACTONE) 25 MG tablet Take 1 tablet (25 mg total) by mouth daily. 30 tablet 0   torsemide 40 MG TABS Take 40 mg by mouth daily. 30 tablet 0   temazepam (RESTORIL) 30 MG capsule TAKE 1 CAPSULE BY MOUTH AT BEDTIME AS  NEEDED FOR SLEEP 90 capsule 1   No current facility-administered medications on file prior to visit.    There are no Patient Instructions on file for this visit. No follow-ups on file.   Kris Hartmann, NP

## 2022-06-22 ENCOUNTER — Ambulatory Visit (INDEPENDENT_AMBULATORY_CARE_PROVIDER_SITE_OTHER): Payer: No Typology Code available for payment source | Admitting: Nurse Practitioner

## 2022-06-22 ENCOUNTER — Encounter (INDEPENDENT_AMBULATORY_CARE_PROVIDER_SITE_OTHER): Payer: Self-pay | Admitting: Nurse Practitioner

## 2022-06-22 VITALS — BP 106/57 | HR 70 | Resp 15 | Wt 187.3 lb

## 2022-06-22 DIAGNOSIS — Z89512 Acquired absence of left leg below knee: Secondary | ICD-10-CM

## 2022-06-22 NOTE — Progress Notes (Signed)
Cardiology Office Note:    Date:  06/23/2022   ID:  [name], DOB 08-03-1955, MRN [id number]  PCP:  Nelwyn Salisbury, MD    [facility] Providers Cardiologist:  Julien Nordmann, MD     Referring MD: Nelwyn Salisbury, MD   Chief Complaint  Patient presents with   Other    CHF/Post cath pt would like to discuss fluid intake restrictions/dieuritic. Meds reviewed verbally with pt.    History of Present Illness:    [name] is a 67 y.o. male with a hx of CAD, systolic CHF, hypertension, diabetes mellitus type 2, neuropathy, PAD, hypothyroidism, gangrene/osteomyelitis s/p recent left BKA.   05/27/22 - 06/04/22 admitted with acute on chronic HF exacerbation, bilateral pleural effusion, vascular congestion with elevated BNP of 1,547, and suspected bilateral PNA. Echocardiogram showed LVEF of 25-30% with associated regional wall motion abnormalities and grade 2 diastolic dysfunction. Diuresed with IV Lasix (~7L) during hospitalization and was continued on metoprolol, and spironolactone. 05/29/22 s/p R thoracentesis, no tapping. 06/02/22, [facility] with recommendation for medical management of HF and consideration of CABG for multivessel CAD. D/c'd with torsemide 40 mg daily and potassium 20 mEq daily. Demand ischemia with elevated troponin ([id number]), but no chest pain or EKG changes.   06/08/22 evaluated in the HF office by Clarisa Kindred NP, doing ok, BP prohibits up-titration of GDMT, recent BKA prohibits daily weights.   06/14/22 evaluated by his PCP, appeared to be doing better.   He presents today accompanied by his wife. He reports that he is doing ok. He denies chest pain, palpitations, dyspnea, pnd, orthopnea, n, v, syncope, edema, weight gain, or early satiety. He endorses occasional dizziness with positional changes most notably when he moves into the shower on his transfer bench. He has been able to weigh himself daily in a chair, presents weight log, they are consistently 186-188. He  checks his BP daily, he is having to use a wrist cuff but feels like it is accurate, BP at home today is 111/75. Discussed recent findings from his left and right heart cath and the need to follow up with [facility].   Past Medical History:  Diagnosis Date   CAD (coronary artery disease)    a. 05/2022 Cath: LM nl, LAD 27m/45m, 70d, D3 90, LCX 45m, 60d, OM1 sev diff dzs, OM2 85p, OM2 subbranch sev diff dzs, RCA 70p/m, 57m.   CHF (congestive heart failure) (HCC)    Chronic HFrEF (heart failure with reduced ejection fraction) (HCC)    a. 05/2022 Echo: EF 25-30%, mild LVH, GrII DD, mid-apical, anteroseptal, and apical HK. Nl RV fxn. RVSP 51.50mmHg. Mildly dil LA. Mod MR/TR. Mild AS/AI.   Diabetes mellitus    Hematospermia    Hypertension    Ischemic cardiomyopathy    a. 05/2022 Echo: EF 25-30%.   Moderate Mitral regurgitation    Moderate tricuspid regurgitation    Nephrolithiasis 02/07/2007   PAD (peripheral artery disease) (HCC)    a. 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   Viral meningitis     Past Surgical History:  Procedure Laterality Date   AMPUTATION Left 05/19/2022   Procedure: AMPUTATION BELOW KNEE;  Surgeon: Renford Dills, MD;  Location: [facility] ORS;  Service: Vascular;  Laterality: Left;   CHOLECYSTECTOMY  12-07-14   laparoscopic, per Dr. Rayburn Ma    LOWER EXTREMITY ANGIOGRAPHY Left 05/16/2022   Procedure: Lower Extremity Angiography;  Surgeon: Renford Dills, MD;  Location: [facility] INVASIVE CV [facility];  Service: Cardiovascular;  Laterality: Left;  RIGHT/LEFT HEART CATH AND CORONARY ANGIOGRAPHY N/A 06/02/2022   Procedure: RIGHT/LEFT HEART CATH AND CORONARY ANGIOGRAPHY;  Surgeon: Yvonne Kendall, MD;  Location: [facility] INV[facility]IVE CV LAB;  Service: Cardiovascular;  Laterality: N/A;    Current Medications: Current Meds  Medication Sig   ACCU-CHEK GUIDE test strip USE TO TEST BLOOD SUGARS TWICE DAILY   Accu-Chek Softclix Lancets lancets daily.   ALPRAZolam (XANAX) 0.5 MG tablet TAKE 1 TABLET (0.5 MG TOTAL)  BY MOUTH 3 (THREE) TIMES DAILY AS NEEDED FOR ANXIETY OR SLEEP.   aspirin EC 81 MG tablet Take 1 tablet (81 mg total) by mouth daily. Swallow whole.   Blood Glucose Monitoring Suppl (ACCU-CHEK GUIDE ME) w/Device KIT USE AS DIRECTED EVERY DAY   clopidogrel (PLAVIX) 75 MG tablet Take 1 tablet (75 mg total) by mouth daily.   Continuous Blood Gluc Sensor (DEXCOM G7 SENSOR) MISC See admin instructions.   dapagliflozin propanediol (FARXIGA) 10 MG TABS tablet Take 1 tablet (10 mg total) by mouth daily.   insulin isophane & regular human KwikPen (NOVOLIN 70/30 KWIKPEN) (70-30) 100 UNIT/ML KwikPen Inject 25 Units into the skin 2 (two) times daily with a meal.   Insulin Pen Needle (BD PEN NEEDLE MICRO U/F) 32G X 6 MM MISC USE AS DIRECTED TO INJECT NOVILIN   levothyroxine (SYNTHROID) 50 MCG tablet TAKE 1 TABLET BY MOUTH EVERY DAY   Multiple Vitamin (MULTIVITAMIN) tablet Take 1 tablet by mouth daily.   potassium chloride SA (KLOR-CON M) 20 MEQ tablet Take 1 tablet (20 mEq total) by mouth daily.   [DISCONTINUED] atorvastatin (LIPITOR) 20 MG tablet Take 1 tablet (20 mg total) by mouth daily.   [DISCONTINUED] losartan (COZAAR) 25 MG tablet Take 0.5 tablets (12.5 mg total) by mouth daily.   [DISCONTINUED] metoprolol succinate (TOPROL-XL) 25 MG 24 hr tablet Take 1 tablet (25 mg total) by mouth daily.   [DISCONTINUED] spironolactone (ALDACTONE) 25 MG tablet Take 1 tablet (25 mg total) by mouth daily.   [DISCONTINUED] torsemide 40 MG TABS Take 40 mg by mouth daily.     Allergies:   Patient has no known allergies.   Social History   Socioeconomic History   Marital status: Married    Spouse name: Not on file   Number of children: 2   Years of education: Not on file   Highest education level: Not on file  Occupational History   Not on file  Tobacco Use   Smoking status: Former   Smokeless tobacco: Never   Tobacco comments:    22 yrs ago   Substance and Sexual Activity   Alcohol use: No    Alcohol/week:  0.0 standard drinks of alcohol   Drug use: No   Sexual activity: Not on file  Other Topics Concern   Not on file  Social History Narrative   Right Handed   Drinks little caffeine - only diet sodas if so   One Story Home    5 Grandchildren   Social Determinants of Health   Financial Resource Strain: Unknown (01/01/2022)   Overall Financial Resource Strain (CARDIA)    Difficulty of Paying Living Expenses: Patient refused  Food Insecurity: Unknown (05/16/2022)   Hunger Vital Sign    Worried About Running Out of Food in the Last Year: Patient refused    Ran Out of Food in the Last Year: Patient refused  Transportation Needs: Unknown (05/16/2022)   PRAPARE - Administrator, Civil Service (Medical): Patient refused    Lack of Transportation (Non-Medical):  Patient refused  Physical Activity: Unknown (01/01/2022)   Exercise Vital Sign    Days of Exercise per Week: Patient refused    Minutes of Exercise per Session: Not on file  Stress: Unknown (01/01/2022)   [place]    Feeling of Stress : Patient refused  Social Connections: Unknown (01/01/2022)   Social Connection and Isolation Panel [NHANES]    Frequency of Communication with Friends and Family: Patient refused    Frequency of Social Gatherings with Friends and Family: Patient refused    Attends Religious Services: Patient refused    Marine scientist or Organizations: Patient refused    Attends Music therapist: Not on file    Marital Status: Patient refused     Family History: The patient's family history includes Arthritis in an other family member; Cholecystitis in his brother, sister, and sister; Coronary artery disease in an other family member; Diabetes in his brother, brother, father, sister, and sister; Heart disease in his father and sister; Hypertension in an other family member; Parkinson's disease in his brother; Rheum  arthritis in his mother; Stroke in an other family member. There is no history of Birth defects.  ROS:   Review of Systems  Constitutional:  Positive for malaise/fatigue (+fatigue).  HENT: Negative.    Eyes: Negative.   Respiratory:  Negative for cough, hemoptysis, shortness of breath and wheezing.   Cardiovascular:  Negative for chest pain, palpitations, orthopnea, claudication, leg swelling and PND.  Gastrointestinal: Negative.   Genitourinary: Negative.   Musculoskeletal: Negative.   Skin: Negative.   Neurological:  Positive for dizziness (occasional). Negative for loss of consciousness.  Endo/Heme/Allergies: Negative.   Psychiatric/Behavioral: Negative.       EKGs/Labs/Other Studies Reviewed:    The following studies were reviewed today:  06/02/22 R/L heart cath - Severe three-vessel coronary artery disease, as detailed below, including sequential 60-70% mid and distal LAD stenoses, sequential 40-60% mid and distal LCx disease, 80-90% lesion involving OM2, and diffusely diseased codominant RCA with 70-90% stenoses in the proximal/mid vessel. Moderately elevated left heart, right heart, and pulmonary artery pressures. Consider outpatient cardiac surgery consultation for consideration of CABG, as coronary disease appears chronic. Elevated troponin on admission most likely represents supply-demand mismatch in the setting of acute HFrEF and fixed coronary disease. If the patient is not a candidate for CABG, PCI to mid LAD +/- OM2 could be considered. Codominant RCA is likely too small for percutaneous intervention.   05/29/22 echo complete - EF 25-30%. LV severely decreased function, +RWMA, mild LVH, grade II DD, severe hypokinesis of the left ventricular, mid-apical anteroseptal wall, anterior wall and apical segment. There is moderately elevated pulmonary artery systolic pressure. The estimated right ventricular systolic pressure is 16.1 mmHg. LA is mildly dilated. Mod MVR, mod annular  calcification. Mod TVR. AV is calcified, AVR is mild. Significant drop in EF with new wall motion abnormalities.   02/09/20 echo complete - EF 55-60%. No RWMA, mild LVH, grade I DD.     EKG:  EKG is ordered today.  The ekg ordered today demonstrates NSR with inferior infarct, consistent with previous tracings.   Recent Labs: 05/27/2022: ALT 24; B Natriuretic Peptide 1,547.0 05/30/2022: TSH 5.605 06/14/2022: Hemoglobin 12.5; Magnesium 2.6; Platelets 160.0 06/23/2022: BUN 59; Creatinine, Ser 1.97; Potassium 4.8; Sodium 134  Recent Lipid Panel    Component Value Date/Time   CHOL 93 05/30/2022 0517   TRIG 72 05/30/2022 0517   HDL 28 (  L) 05/30/2022 0517   CHOLHDL 3.3 05/30/2022 0517   VLDL 14 05/30/2022 0517   LDLCALC 51 05/30/2022 0517     Risk Assessment/Calculations:                Physical Exam:    VS:  BP (!) 98/58 (BP Location: Left Arm, Patient Position: Sitting, Cuff Size: Large)   Pulse 71   Ht 5\' 8"  (1.727 m)   Wt 186 lb 2 oz (84.4 kg)   SpO2 98%   BMI 28.30 kg/m     Wt Readings from Last 3 Encounters:  06/23/22 186 lb 2 oz (84.4 kg)  06/22/22 187 lb 4.8 oz (85 kg)  06/14/22 202 lb (91.6 kg)     GEN:  Well nourished, well developed in no acute distress HEENT: Normal NECK: No JVD; No carotid bruits LYMPHATICS: No lymphadenopathy CARDIAC: RRR, no murmurs, rubs, gallops RESPIRATORY:  Clear to auscultation without rales, wheezing or rhonchi  ABDOMEN: Soft, non-tender, non-distended MUSCULOSKELETAL:  No edema; No deformity  SKIN: Warm and dry NEUROLOGIC:  Alert and oriented x 3 PSYCHIATRIC:  Normal affect   ASSESSMENT:    1. Chronic systolic heart failure ([place])   2. Coronary artery disease of native artery of native heart with stable angina pectoris (HCC)   3. Elevated serum creatinine   4. PAD (peripheral artery disease) ([place])   5. Atherosclerosis of artery of extremity with ulceration (HCC)    PLAN:    In order of problems listed above:  Chronic  systolic HF - on GDMT farxiga, losartan, metoprolol and spironolactone. Up-titration is limited by soft BP readings. He is adhering to low sodium diet and watching his fluid intake strictly. He will continue to weigh daily. Will decrease his metoprolol to 12.5 mg daily in the setting of soft BP's. Will change his torsemide to take as needed for weight gain of 3 lbs/night or 5 lbs/week.  CAD - Denies chest pain or acute decompensation. Continue ASA 81 mg daily, continue Plavix 75 mg daily, continue metoprolol 12.5 mg daily. Will send referral to [facility] to see if any surgical coronary revascularization is indicated.  AKI - 12/17 1.34 > 12.27 1.92. Will change his torsemide to as needed in setting of AKI. Repeat BMET today.  HTN - BP today is 95/58 associated with occasional dizziness. Will decrease his metoprolol to 12.5 mg daily, change his torsemide to as needed.  PAD/recent BKA - followed by Vascular. Staples removed yesterday. Awaiting prosthesis.            Medication Adjustments/Labs and Tests Ordered: Current medicines are reviewed at length with the patient today.  Concerns regarding medicines are outlined above.  Orders Placed This Encounter  Procedures   Basic metabolic panel   Ambulatory referral to Cardiothoracic Surgery   EKG 12-Lead   Meds ordered this encounter  Medications   Torsemide 40 MG TABS    Sig: Take 40 mg by mouth as needed.    Dispense:  90 tablet    Refill:  3   spironolactone (ALDACTONE) 25 MG tablet    Sig: Take 1 tablet (25 mg total) by mouth daily.    Dispense:  90 tablet    Refill:  3   losartan (COZAAR) 25 MG tablet    Sig: Take 0.5 tablets (12.5 mg total) by mouth daily.    Dispense:  45 tablet    Refill:  3   atorvastatin (LIPITOR) 20 MG tablet    Sig: Take 1 tablet (20  mg total) by mouth daily.    Dispense:  90 tablet    Refill:  3   metoprolol succinate (TOPROL-XL) 25 MG 24 hr tablet    Sig: Take 0.5 tablets (12.5 mg total) by mouth daily.     Dispense:  45 tablet    Refill:  3    Patient Instructions  Medication Instructions:  Your physician has recommended you make the following change in your medication:   STOP Potassium DECREASE Toprol to 12.5 mg once daily TAKE Torsemide AS NEEDED for weight gain of 3 pounds overnight or 5 pounds in a week.   *If you need a refill on your cardiac medications before your next appointment, please call your pharmacy*   Lab Work: BMET today over at the [facility] entrance and stop at registration to check in.    If you have labs (blood work) drawn today and your tests are completely normal, you will receive your results only by: MyChart Message (if you have MyChart) OR A paper copy in the mail If you have any lab test that is abnormal or we need to change your treatment, we will call you to review the results.   Testing/Procedures: None   Follow-Up: At [facility], you and your health needs are our priority.  As part of our continuing mission to provide you with exceptional heart care, we have created designated Provider Care Teams.  These Care Teams include your primary Cardiologist (physician) and Advanced Practice Providers (APPs -  Physician Assistants and Nurse Practitioners) who all work together to provide you with the care you need, when you need it.   Your next appointment:   1 month(s)  The format for your next appointment:   In Person  Provider:   Julien Nordmann, MD or Eula Listen, PA-C    Referral placed to [facility] office. Their number is [phone]  Important Information About Sugar         Signed, Flossie Dibble, NP  06/23/2022 5:22 PM    [facility]

## 2022-06-23 ENCOUNTER — Other Ambulatory Visit: Payer: Self-pay | Admitting: Physician Assistant

## 2022-06-23 ENCOUNTER — Encounter: Payer: Self-pay | Admitting: Physician Assistant

## 2022-06-23 ENCOUNTER — Ambulatory Visit: Payer: No Typology Code available for payment source | Attending: Physician Assistant | Admitting: Cardiology

## 2022-06-23 ENCOUNTER — Other Ambulatory Visit
Admission: RE | Admit: 2022-06-23 | Discharge: 2022-06-23 | Disposition: A | Discharge status: Home / Self Care | Payer: No Typology Code available for payment source | Source: Ambulatory Visit | Attending: Cardiology | Admitting: Cardiology

## 2022-06-23 ENCOUNTER — Telehealth: Payer: Self-pay | Admitting: Cardiovascular Disease

## 2022-06-23 VITALS — BP 98/58 | HR 71 | Ht 68.0 in | Wt 186.1 lb

## 2022-06-23 DIAGNOSIS — I25118 Atherosclerotic heart disease of native coronary artery with other forms of angina pectoris: Secondary | ICD-10-CM | POA: Insufficient documentation

## 2022-06-23 DIAGNOSIS — I70299 Other atherosclerosis of native arteries of extremities, unspecified extremity: Secondary | ICD-10-CM

## 2022-06-23 DIAGNOSIS — I739 Peripheral vascular disease, unspecified: Secondary | ICD-10-CM

## 2022-06-23 DIAGNOSIS — I5022 Chronic systolic (congestive) heart failure: Secondary | ICD-10-CM

## 2022-06-23 DIAGNOSIS — L97909 Non-pressure chronic ulcer of unspecified part of unspecified lower leg with unspecified severity: Secondary | ICD-10-CM | POA: Diagnosis present

## 2022-06-23 DIAGNOSIS — N179 Acute kidney failure, unspecified: Secondary | ICD-10-CM | POA: Diagnosis not present

## 2022-06-23 DIAGNOSIS — Z0279 Encounter for issue of other medical certificate: Secondary | ICD-10-CM

## 2022-06-23 LAB — BASIC METABOLIC PANEL WITH GFR
Anion gap: 9 (ref 5–15)
BUN: 59 mg/dL — ABNORMAL HIGH (ref 8–23)
CO2: 27 mmol/L (ref 22–32)
Calcium: 8.9 mg/dL (ref 8.9–10.3)
Chloride: 98 mmol/L (ref 98–111)
Creatinine, Ser: 1.97 mg/dL — ABNORMAL HIGH (ref 0.61–1.24)
GFR, Estimated: 37 mL/min — ABNORMAL LOW
Glucose, Bld: 142 mg/dL — ABNORMAL HIGH (ref 70–99)
Potassium: 4.8 mmol/L (ref 3.5–5.1)
Sodium: 134 mmol/L — ABNORMAL LOW (ref 135–145)

## 2022-06-23 MED ORDER — LOSARTAN POTASSIUM 25 MG PO TABS: 12.5000 mg | ORAL_TABLET | Freq: Every day | ORAL | 3 refills | Status: DC

## 2022-06-23 MED ORDER — SPIRONOLACTONE 25 MG PO TABS: 25.0000 mg | ORAL_TABLET | Freq: Every day | ORAL | 3 refills | Status: DC

## 2022-06-23 MED ORDER — METOPROLOL SUCCINATE ER 25 MG PO TB24: 12.5000 mg | ORAL_TABLET | Freq: Every day | ORAL | 3 refills | Status: DC

## 2022-06-23 MED ORDER — ATORVASTATIN CALCIUM 20 MG PO TABS: 20.0000 mg | ORAL_TABLET | Freq: Every day | ORAL | 3 refills | Status: DC

## 2022-06-23 MED ORDER — TORSEMIDE 40 MG PO TABS: 40.0000 mg | ORAL_TABLET | ORAL | 3 refills | Status: DC | PRN

## 2022-06-23 NOTE — Telephone Encounter (Signed)
Please see note from pharmacy

## 2022-06-23 NOTE — Telephone Encounter (Signed)
Received [employer] Forms Patient paid $29 fee cash and signed [facility] forms Placed in nurse box

## 2022-06-23 NOTE — Patient Instructions (Addendum)
Medication Instructions:  Your physician has recommended you make the following change in your medication:   STOP Potassium DECREASE Toprol to 12.5 mg once daily TAKE Torsemide AS NEEDED for weight gain of 3 pounds overnight or 5 pounds in a week.   *If you need a refill on your cardiac medications before your next appointment, please call your pharmacy*   Lab Work: BMET today over at the [facility] entrance and stop at registration to check in.    If you have labs (blood work) drawn today and your tests are completely normal, you will receive your results only by: [place] (if you have MyChart) OR A paper copy in the mail If you have any lab test that is abnormal or we need to change your treatment, we will call you to review the results.   Testing/Procedures: None   Follow-Up: At [facility], you and your health needs are our priority.  As part of our continuing mission to provide you with exceptional heart care, we have created designated Provider Care Teams.  These Care Teams include your primary Cardiologist (physician) and Advanced Practice Providers (APPs -  Physician Assistants and Nurse Practitioners) who all work together to provide you with the care you need, when you need it.   Your next appointment:   1 month(s)  The format for your next appointment:   In Person  Provider:   Ida Rogue, MD or Christell Faith, PA-C    Referral placed to [facility] office. Their number is [phone]  Important Information About Sugar

## 2022-06-24 ENCOUNTER — Other Ambulatory Visit: Payer: Self-pay | Admitting: Family Medicine

## 2022-06-24 DIAGNOSIS — E039 Hypothyroidism, unspecified: Secondary | ICD-10-CM

## 2022-06-26 ENCOUNTER — Encounter (INDEPENDENT_AMBULATORY_CARE_PROVIDER_SITE_OTHER): Payer: Self-pay | Admitting: Nurse Practitioner

## 2022-06-26 ENCOUNTER — Other Ambulatory Visit: Payer: Self-pay | Admitting: Family Medicine

## 2022-06-26 DIAGNOSIS — E039 Hypothyroidism, unspecified: Secondary | ICD-10-CM

## 2022-06-26 MED ORDER — LEVOTHYROXINE SODIUM 50 MCG PO TABS: 50.0000 ug | ORAL_TABLET | Freq: Every day | ORAL | 0 refills | Status: DC

## 2022-06-26 MED ORDER — TORSEMIDE 40 MG PO TABS: 40.0000 mg | ORAL_TABLET | ORAL | 3 refills | Status: DC | PRN

## 2022-06-26 NOTE — Telephone Encounter (Signed)
Forms given to staff in up front office for further processing.

## 2022-06-26 NOTE — Addendum Note (Signed)
Addended by: Valora Corporal on: 06/26/2022 08:01 AM   Modules accepted: Orders

## 2022-06-26 NOTE — Telephone Encounter (Signed)
Form completed and given to clinical staff.

## 2022-06-26 NOTE — Progress Notes (Signed)
Subjective:    Patient ID: [name], male    DOB: Oct 30, 1955, 67 y.o.   MRN: [id number] Chief Complaint  Patient presents with   Follow-up    1-2 week staple removal    [name] is a 67 year old male who underwent amputation of his left lower extremity on 05/19/2022.  He had a left below-knee amputation.  This was due to an extensive nonhealing ulceration.  Since that time the patient has been doing well.  He denies any significant phantom leg pain.  The wound is healing well.  There is no evidence of infection.  Half his staples removed last visit.    Review of Systems  Skin:  Positive for wound.  Neurological:  Positive for weakness.  All other systems reviewed and are negative.      Objective:   Physical Exam Vitals reviewed.  HENT:     Head: Normocephalic.  Cardiovascular:     Rate and Rhythm: Normal rate.  Pulmonary:     Effort: Pulmonary effort is normal.  Skin:    General: Skin is warm and dry.  Neurological:     Mental Status: He is alert and oriented to person, place, and time.     Motor: Weakness present.     Gait: Gait abnormal.  Psychiatric:        Mood and Affect: Mood normal.        Behavior: Behavior normal.        Thought Content: Thought content normal.        Judgment: Judgment normal.     BP (!) 106/57 (BP Location: Left Arm)   Pulse 70   Resp 15   Wt 187 lb 4.8 oz (85 kg)   BMI 28.48 kg/m   Past Medical History:  Diagnosis Date   CAD (coronary artery disease)    a. 05/2022 Cath: LM nl, LAD 67m/48m, 70d, D3 90, LCX 52m, 60d, OM1 sev diff dzs, OM2 85p, OM2 subbranch sev diff dzs, RCA 70p/m, 77m.   CHF (congestive heart failure) (HCC)    Chronic HFrEF (heart failure with reduced ejection fraction) ([place])    a. 05/2022 Echo: EF 25-30%, mild LVH, GrII DD, mid-apical, anteroseptal, and apical HK. Nl RV fxn. RVSP 51.67mmHg. Mildly dil LA. Mod MR/TR. Mild AS/AI.   Diabetes mellitus    Hematospermia    Hypertension    Ischemic cardiomyopathy     a. 05/2022 Echo: EF 25-30%.   Moderate Mitral regurgitation    Moderate tricuspid regurgitation    Nephrolithiasis 02/07/2007   PAD (peripheral artery disease) ([place])    a. 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   Viral meningitis     Social History   Socioeconomic History   Marital status: Married    Spouse name: Not on file   Number of children: 2   Years of education: Not on file   Highest education level: Not on file  Occupational History   Not on file  Tobacco Use   Smoking status: Former   Smokeless tobacco: Never   Tobacco comments:    22 yrs ago   Substance and Sexual Activity   Alcohol use: No    Alcohol/week: 0.0 standard drinks of alcohol   Drug use: No   Sexual activity: Not on file  Other Topics Concern   Not on file  Social History Narrative   Right Handed   Drinks little caffeine - only diet sodas if so   One Story Home    5 Grandchildren  Social Determinants of Health   Financial Resource Strain: Unknown (01/01/2022)   Overall Financial Resource Strain (CARDIA)    Difficulty of Paying Living Expenses: Patient refused  Food Insecurity: Unknown (05/16/2022)   Hunger Vital Sign    Worried About Running Out of Food in the Last Year: Patient refused    [place] in the Last Year: Patient refused  Transportation Needs: Unknown (05/16/2022)   [place] - Transportation    Lack of Transportation (Medical): Patient refused    Lack of Transportation (Non-Medical): Patient refused  Physical Activity: Unknown (01/01/2022)   Exercise Vital Sign    Days of Exercise per Week: Patient refused    Minutes of Exercise per Session: Not on file  Stress: Unknown (01/01/2022)   [place] of Stress : Patient refused  Social Connections: Unknown (01/01/2022)   Social Connection and Isolation Panel [NHANES]    Frequency of Communication with Friends and Family: Patient refused    Frequency of Social  Gatherings with Friends and Family: Patient refused    Attends Religious Services: Patient refused    Active Member of Clubs or Organizations: Patient refused    Attends Archivist Meetings: Not on file    Marital Status: Patient refused  Intimate Partner Violence: Not At Risk (05/16/2022)   Humiliation, Afraid, Rape, and Kick questionnaire    Fear of Current or Ex-Partner: No    Emotionally Abused: No    Physically Abused: No    Sexually Abused: No    Past Surgical History:  Procedure Laterality Date   AMPUTATION Left 05/19/2022   Procedure: AMPUTATION BELOW KNEE;  Surgeon: Katha Cabal, MD;  Location: [facility];  Service: Vascular;  Laterality: Left;   CHOLECYSTECTOMY  12-07-14   laparoscopic, per Dr. Rush Farmer    LOWER EXTREMITY ANGIOGRAPHY Left 05/16/2022   Procedure: Lower Extremity Angiography;  Surgeon: Katha Cabal, MD;  Location: [facility];  Service: Cardiovascular;  Laterality: Left;   RIGHT/LEFT HEART CATH AND CORONARY ANGIOGRAPHY N/A 06/02/2022   Procedure: RIGHT/LEFT HEART CATH AND CORONARY ANGIOGRAPHY;  Surgeon: Nelva Bush, MD;  Location: [facility];  Service: Cardiovascular;  Laterality: N/A;    Family History  Problem Relation Age of Onset   Rheum arthritis Mother    Diabetes Father    Heart disease Father    Cholecystitis Sister    Cholecystitis Brother    Diabetes Brother    Diabetes Brother    Parkinson's disease Brother    Heart disease Sister    Diabetes Sister    Cholecystitis Sister    Diabetes Sister    Arthritis Other    Hypertension Other    Stroke Other    Coronary artery disease Other    Birth defects Neg Hx     No Known Allergies     Latest Ref Rng & Units 06/14/2022   11:52 AM 06/04/2022    5:37 AM 06/03/2022    6:26 AM  CBC  WBC 4.0 - 10.5 K/uL 7.4  7.3  8.3   Hemoglobin 13.0 - 17.0 g/dL 12.5  9.1  9.3   Hematocrit 39.0 - 52.0 % 37.6  28.9  28.8   Platelets 150.0 - 400.0 K/uL 160.0   224  243       CMP     Component Value Date/Time   NA 134 (L) 06/23/2022 1612   K 4.8 06/23/2022 1612   CL  98 06/23/2022 1612   CO2 27 06/23/2022 1612   GLUCOSE 142 (H) 06/23/2022 1612   BUN 59 (H) 06/23/2022 1612   CREATININE 1.97 (H) 06/23/2022 1612   CREATININE 1.31 (H) 03/15/2020 1700   CALCIUM 8.9 06/23/2022 1612   PROT 7.1 05/28/2022 0505   ALBUMIN 2.8 (L) 05/27/2022 1846   AST 23 05/27/2022 1846   ALT 24 05/27/2022 1846   ALKPHOS 95 05/27/2022 1846   BILITOT 0.8 05/27/2022 1846   GFRNONAA 37 (L) 06/23/2022 1612   GFRAA >60 01/10/2020 1107     VAS [place] ABI WITH/WO TBI  Result Date: 05/08/2022  LOWER EXTREMITY DOPPLER STUDY Patient Name:  CASSIAN TORELLI  Date of Exam:   05/04/2022 Medical Rec #: [id number]     Accession #:    [id number] Date of Birth: Aug 24, 1955      Patient Gender: M Patient Age:   71 years Exam Location:  [facility] Procedure:      VAS [place] ABI WITH/WO TBI Referring Phys: Festus Barren --------------------------------------------------------------------------------  Indications: Ulceration. left heel x 10 months  Comparison Study: 08/18/2021 Performing Technologist: Salvadore Farber RVT  Examination Guidelines: A complete evaluation includes at minimum, Doppler waveform signals and systolic blood pressure reading at the level of bilateral brachial, anterior tibial, and posterior tibial arteries, when vessel segments are accessible. Bilateral testing is considered an integral part of a complete examination. Photoelectric Plethysmograph (PPG) waveforms and toe systolic pressure readings are included as required and additional duplex testing as needed. Limited examinations for reoccurring indications may be performed as noted.  ABI Findings: +---------+------------------+-----+----------+--------+ Right    Rt Pressure (mmHg)IndexWaveform  Comment  +---------+------------------+-----+----------+--------+ Brachial 184                                        +---------+------------------+-----+----------+--------+ ATA      176               0.94 monophasic         +---------+------------------+-----+----------+--------+ PTA      188               1.01 biphasic           +---------+------------------+-----+----------+--------+ Great Toe132               0.71 Normal             +---------+------------------+-----+----------+--------+ +---------+------------------+-----+--------+-------+ Left     Lt Pressure (mmHg)IndexWaveformComment +---------+------------------+-----+--------+-------+ Brachial 187                                    +---------+------------------+-----+--------+-------+ ATA      183               0.98 biphasic        +---------+------------------+-----+--------+-------+ PTA      182               0.97 biphasic        +---------+------------------+-----+--------+-------+ Great Toe101               0.54 Abnormal        +---------+------------------+-----+--------+-------+ +-------+-----------+-----------+------------+------------+ ABI/TBIToday's ABIToday's TBIPrevious ABIPrevious TBI +-------+-----------+-----------+------------+------------+ Right  1.01       .71        1.05        .50          +-------+-----------+-----------+------------+------------+  Left   .98        .54        .98         .51          +-------+-----------+-----------+------------+------------+ Right TBIs appear increased compared to prior study on 08/2021. Left ABIs and TBIs appear essentially unchanged.  Summary: Right: Resting right ankle-brachial index is within normal range. The right toe-brachial index is normal. Left: Resting left ankle-brachial index is within normal range. The left toe-brachial index is abnormal. *See table(s) above for measurements and observations.  Electronically signed by Hortencia Pilar MD on 05/08/2022 at 4:58:27 PM.    Final        Assessment & Plan:   1. 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  Staples removed today.  Steri-Strips applied.  He will return in 1 to 2 weeks for wound reevaluation.   Current Outpatient Medications on File Prior to Visit  Medication Sig Dispense Refill   ACCU-CHEK GUIDE test strip USE TO TEST BLOOD SUGARS TWICE DAILY 200 strip 3   Accu-Chek Softclix Lancets lancets daily.     ALPRAZolam (XANAX) 0.5 MG tablet TAKE 1 TABLET (0.5 MG TOTAL) BY MOUTH 3 (THREE) TIMES DAILY AS NEEDED FOR ANXIETY OR SLEEP. 270 tablet 1   aspirin EC 81 MG tablet Take 1 tablet (81 mg total) by mouth daily. Swallow whole. 30 tablet 12   Blood Glucose Monitoring Suppl (ACCU-CHEK GUIDE ME) w/Device KIT USE AS DIRECTED EVERY DAY     clopidogrel (PLAVIX) 75 MG tablet Take 1 tablet (75 mg total) by mouth daily. 30 tablet 3   Continuous Blood Gluc Sensor (DEXCOM G7 SENSOR) MISC See admin instructions.     dapagliflozin propanediol (FARXIGA) 10 MG TABS tablet Take 1 tablet (10 mg total) by mouth daily. 30 tablet 0   insulin isophane & regular human KwikPen (NOVOLIN 70/30 KWIKPEN) (70-30) 100 UNIT/ML KwikPen Inject 25 Units into the skin 2 (two) times daily with a meal. 300 mL 3   Insulin Pen Needle (BD PEN NEEDLE MICRO U/F) 32G X 6 MM MISC USE AS DIRECTED TO INJECT NOVILIN 200 each 1   levothyroxine (SYNTHROID) 50 MCG tablet TAKE 1 TABLET BY MOUTH EVERY DAY 90 tablet 0   potassium chloride SA (KLOR-CON M) 20 MEQ tablet Take 1 tablet (20 mEq total) by mouth daily. 30 tablet 0   Multiple Vitamin (MULTIVITAMIN) tablet Take 1 tablet by mouth daily.     No current facility-administered medications on file prior to visit.    There are no Patient Instructions on file for this visit. No follow-ups on file.   Kris Hartmann, NP

## 2022-06-26 NOTE — Telephone Encounter (Signed)
Forms placed on providers desk for his review and completion.

## 2022-06-27 NOTE — Telephone Encounter (Signed)
Forms scanned and faxed Patient informed forms are ready for pickup  Placed in pick up bin

## 2022-06-28 ENCOUNTER — Telehealth: Payer: Self-pay | Admitting: Cardiovascular Disease

## 2022-06-28 DIAGNOSIS — F411 Generalized anxiety disorder: Secondary | ICD-10-CM

## 2022-06-28 DIAGNOSIS — Z87442 Personal history of urinary calculi: Secondary | ICD-10-CM

## 2022-06-28 DIAGNOSIS — I420 Dilated cardiomyopathy: Secondary | ICD-10-CM

## 2022-06-28 DIAGNOSIS — D649 Anemia, unspecified: Secondary | ICD-10-CM

## 2022-06-28 DIAGNOSIS — G8929 Other chronic pain: Secondary | ICD-10-CM

## 2022-06-28 DIAGNOSIS — I2510 Atherosclerotic heart disease of native coronary artery without angina pectoris: Secondary | ICD-10-CM

## 2022-06-28 DIAGNOSIS — R109 Unspecified abdominal pain: Secondary | ICD-10-CM

## 2022-06-28 DIAGNOSIS — N529 Male erectile dysfunction, unspecified: Secondary | ICD-10-CM

## 2022-06-28 DIAGNOSIS — Z6830 Body mass index (BMI) 30.0-30.9, adult: Secondary | ICD-10-CM

## 2022-06-28 DIAGNOSIS — F909 Attention-deficit hyperactivity disorder, unspecified type: Secondary | ICD-10-CM

## 2022-06-28 DIAGNOSIS — G4700 Insomnia, unspecified: Secondary | ICD-10-CM

## 2022-06-28 DIAGNOSIS — E1151 Type 2 diabetes mellitus with diabetic peripheral angiopathy without gangrene: Secondary | ICD-10-CM

## 2022-06-28 DIAGNOSIS — I110 Hypertensive heart disease with heart failure: Secondary | ICD-10-CM

## 2022-06-28 DIAGNOSIS — Z7982 Long term (current) use of aspirin: Secondary | ICD-10-CM

## 2022-06-28 DIAGNOSIS — E1140 Type 2 diabetes mellitus with diabetic neuropathy, unspecified: Secondary | ICD-10-CM

## 2022-06-28 DIAGNOSIS — Z9181 History of falling: Secondary | ICD-10-CM

## 2022-06-28 DIAGNOSIS — E11319 Type 2 diabetes mellitus with unspecified diabetic retinopathy without macular edema: Secondary | ICD-10-CM

## 2022-06-28 DIAGNOSIS — Z7902 Long term (current) use of antithrombotics/antiplatelets: Secondary | ICD-10-CM

## 2022-06-28 DIAGNOSIS — I255 Ischemic cardiomyopathy: Secondary | ICD-10-CM

## 2022-06-28 DIAGNOSIS — Z8781 Personal history of (healed) traumatic fracture: Secondary | ICD-10-CM

## 2022-06-28 DIAGNOSIS — Z89512 Acquired absence of left leg below knee: Secondary | ICD-10-CM

## 2022-06-28 DIAGNOSIS — Z4781 Encounter for orthopedic aftercare following surgical amputation: Secondary | ICD-10-CM

## 2022-06-28 DIAGNOSIS — I5043 Acute on chronic combined systolic (congestive) and diastolic (congestive) heart failure: Secondary | ICD-10-CM

## 2022-06-28 DIAGNOSIS — E039 Hypothyroidism, unspecified: Secondary | ICD-10-CM

## 2022-06-28 DIAGNOSIS — Z8701 Personal history of pneumonia (recurrent): Secondary | ICD-10-CM

## 2022-06-28 DIAGNOSIS — H269 Unspecified cataract: Secondary | ICD-10-CM

## 2022-06-28 DIAGNOSIS — Z794 Long term (current) use of insulin: Secondary | ICD-10-CM

## 2022-06-28 MED ORDER — DAPAGLIFLOZIN PROPANEDIOL 10 MG PO TABS: 10.0000 mg | ORAL_TABLET | Freq: Every day | ORAL | 3 refills | Status: DC

## 2022-06-28 NOTE — Telephone Encounter (Signed)
Pt called questioning if he is to continue with farxiga 10 mg daily. Pt advised per last office visit on 06/23/22, Venia Carbon, NP indicated pt was taking medication and to continue.  Pt stated he needed new refills. Refills sent to the requested pharmacy.

## 2022-06-28 NOTE — Telephone Encounter (Signed)
Pt c/o medication issue:  1. Name of Medication:   dapagliflozin propanediol (FARXIGA) 10 MG TABS tablet  2. How are you currently taking this medication (dosage and times per day)?   3. Are you having a reaction (difficulty breathing--STAT)?   4. What is your medication issue?   Patient would like to know if he should still be taking this medication. Please advise.

## 2022-06-29 ENCOUNTER — Encounter: Payer: No Typology Code available for payment source | Admitting: Family

## 2022-07-02 NOTE — Progress Notes (Signed)
[address]       [place] [address]             [phone]                    Zigmund W Mata Vevay Medical Record #[id number] Date of Birth: [birth year]  Referring: Laurey Morale, MD Primary Care: Laurey Morale, MD Primary Cardiologist: Ida Rogue, MD  Chief Complaint:   No chief complaint on file.   History of Present Illness:    NIKODEM LEADBETTER 67 y.o. male referred for evaluation of coronary artery disease. Overall it appears he was hospitalized for a BKA and then at home had angina and then another hospitalization after MI.   Not active at all - waiting on the prosthetic. Wheelchair or sedentary. Patient is interested in waiting until he has prosthetic and is more mobile before surgery with severe chest precautions.   Per recent cardiology HPI: " Mr. Taillon is a 67 y.o. 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   He has no h/o HF.    He was recently discharged 05/23/22 after L BKA. A couple days after the procedure he began to experience SOB, orthopnea, PND, and chest pressure. No chest pain just pressure. Decided to come to ED for further evaluation.    In the ED he presented with worsening SOB. HsTrop 803>734, BNP 1547, Cr 1.45 (baseline ~1), WBC 13, resp panel (-). CXR with mild cardiomegaly, pulmonary vascular congestion, and bilateral pleural effusions. Echo showed new EF of 25-30% with anterior/anteroseptal WMA, Mild AS and GIIDD (8/21 echo EF was 55-60%). Was placed on supplemental oxygen and diuresed with IV lasix.    Underwent R thoracentesis with 1.2L off and feels much better. Denies Current CP. Mild SOB.    Cardiac studies reviewed: Echo 12/11: EF 25-30%, LV with regional wall abnormalities. Mild LVH, GIIDD, severe hypokineses of LV. Normal RV systolic function. RVSP ~51.3. LA mildly dilated. Mod pleural effusion. Mod MR, mod mitral annular calcifications, Mod TR. Mild aortic valve  regurgitation. Echo 8/21: EF 55-60%, mild LVH, GIDD, RV systolic pressure normal  "   Past Medical History:  Diagnosis Date   CAD (coronary artery disease)    a. 05/2022 Cath: LM nl, LAD 17m/76m, 70d, D3 90, LCX 58m, 60d, OM1 sev diff dzs, OM2 85p, OM2 subbranch sev diff dzs, RCA 70p/m, 82m.   CHF (congestive heart failure) (HCC)    Chronic HFrEF (heart failure with reduced ejection fraction) ([place])    a. 05/2022 Echo: EF 25-30%, mild LVH, GrII DD, mid-apical, anteroseptal, and apical HK. Nl RV fxn. RVSP 51.49mmHg. Mildly dil LA. Mod MR/TR. Mild AS/AI.   Diabetes mellitus    Hematospermia    Hypertension    Ischemic cardiomyopathy    a. 05/2022 Echo: EF 25-30%.   Moderate Mitral regurgitation    Moderate tricuspid regurgitation    Nephrolithiasis 02/07/2007   PAD (peripheral artery disease) ([place])    a. 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   Viral meningitis     Past Surgical History:  Procedure Laterality Date   AMPUTATION Left 05/19/2022   Procedure: AMPUTATION BELOW KNEE;  Surgeon: Katha Cabal, MD;  Location: [facility];  Service: Vascular;  Laterality: Left;   CHOLECYSTECTOMY  12-07-14   laparoscopic, per Dr. Rush Farmer    LOWER EXTREMITY ANGIOGRAPHY Left 05/16/2022   Procedure: Lower Extremity Angiography;  Surgeon: Katha Cabal,  MD;  Location: [facility];  Service: Cardiovascular;  Laterality: Left;   RIGHT/LEFT HEART CATH AND CORONARY ANGIOGRAPHY N/A 06/02/2022   Procedure: RIGHT/LEFT HEART CATH AND CORONARY ANGIOGRAPHY;  Surgeon: Yvonne Kendall, MD;  Location: [facility];  Service: Cardiovascular;  Laterality: N/A;    Family History  Problem Relation Age of Onset   Rheum arthritis Mother    Diabetes Father    Heart disease Father    Cholecystitis Sister    Cholecystitis Brother    Diabetes Brother    Diabetes Brother    Parkinson's disease Brother    Heart disease Sister    Diabetes Sister    Cholecystitis Sister    Diabetes Sister    Arthritis Other     Hypertension Other    Stroke Other    Coronary artery disease Other    Birth defects Neg Hx      Social History   Tobacco Use  Smoking Status Former  Smokeless Tobacco Never  Tobacco Comments   22 yrs ago     Social History   Substance and Sexual Activity  Alcohol Use No   Alcohol/week: 0.0 standard drinks of alcohol     No Known Allergies  Current Outpatient Medications  Medication Sig Dispense Refill   ACCU-CHEK GUIDE test strip USE TO TEST BLOOD SUGARS TWICE DAILY 200 strip 3   Accu-Chek Softclix Lancets lancets daily.     ALPRAZolam (XANAX) 0.5 MG tablet TAKE 1 TABLET (0.5 MG TOTAL) BY MOUTH 3 (THREE) TIMES DAILY AS NEEDED FOR ANXIETY OR SLEEP. 270 tablet 1   aspirin EC 81 MG tablet Take 1 tablet (81 mg total) by mouth daily. Swallow whole. 30 tablet 12   atorvastatin (LIPITOR) 20 MG tablet Take 1 tablet (20 mg total) by mouth daily. 90 tablet 3   Blood Glucose Monitoring Suppl (ACCU-CHEK GUIDE ME) w/Device KIT USE AS DIRECTED EVERY DAY     clopidogrel (PLAVIX) 75 MG tablet Take 1 tablet (75 mg total) by mouth daily. 30 tablet 3   Continuous Blood Gluc Sensor (DEXCOM G7 SENSOR) MISC See admin instructions.     dapagliflozin propanediol (FARXIGA) 10 MG TABS tablet Take 1 tablet (10 mg total) by mouth daily. 90 tablet 3   insulin isophane & regular human KwikPen (NOVOLIN 70/30 KWIKPEN) (70-30) 100 UNIT/ML KwikPen Inject 25 Units into the skin 2 (two) times daily with a meal. 300 mL 3   Insulin Pen Needle (BD PEN NEEDLE MICRO U/F) 32G X 6 MM MISC USE AS DIRECTED TO INJECT NOVILIN 200 each 1   levothyroxine (SYNTHROID) 50 MCG tablet TAKE 1 TABLET BY MOUTH EVERY DAY 90 tablet 0   levothyroxine (SYNTHROID) 50 MCG tablet Take 1 tablet (50 mcg total) by mouth daily. 90 tablet 0   losartan (COZAAR) 25 MG tablet Take 0.5 tablets (12.5 mg total) by mouth daily. 45 tablet 3   metoprolol succinate (TOPROL-XL) 25 MG 24 hr tablet Take 0.5 tablets (12.5 mg total) by mouth daily. 45  tablet 3   Multiple Vitamin (MULTIVITAMIN) tablet Take 1 tablet by mouth daily.     potassium chloride SA (KLOR-CON M) 20 MEQ tablet Take 1 tablet (20 mEq total) by mouth daily. 30 tablet 0   spironolactone (ALDACTONE) 25 MG tablet Take 1 tablet (25 mg total) by mouth daily. 90 tablet 3   Torsemide 40 MG TABS Take 40 mg by mouth as needed (AS NEEDED for weight gain of 3 pounds overnight or 5 pounds in  a week). 90 tablet 3   No current facility-administered medications for this visit.    ROS 14 point ROS reviewed and negative except as per HPI   PHYSICAL EXAMINATION: There were no vitals taken for this visit.  Gen: NAD Neuro: Alert and oriented CV: regular Resp: Nonlaboured Abd: Soft, ntnd Extr:LLE amputation.   Diagnostic Studies & Laboratory data:     Recent Radiology Findings:   No results found.     I have independently reviewed the above radiology studies  and reviewed the findings with the patient.   Recent Lab Findings: Lab Results  Component Value Date   WBC 7.4 06/14/2022   HGB 12.5 (L) 06/14/2022   HCT 37.6 (L) 06/14/2022   PLT 160.0 06/14/2022   GLUCOSE 142 (H) 06/23/2022   CHOL 93 05/30/2022   TRIG 72 05/30/2022   HDL 28 (L) 05/30/2022   LDLCALC 51 05/30/2022   ALT 24 05/27/2022   AST 23 05/27/2022   NA 134 (L) 06/23/2022   K 4.8 06/23/2022   CL 98 06/23/2022   CREATININE 1.97 (H) 06/23/2022   BUN 59 (H) 06/23/2022   CO2 27 06/23/2022   TSH 5.605 (H) 05/30/2022   INR 1.1 05/27/2022   HGBA1C 6.7 (H) 05/21/2022     Assessment / Plan:    Leonie Green Ragain 67 y.o. male referred for evaluation of coronary artery disease  For comorbidities he has a LLE amputation, Cr around 2, reduced EF.    By echo, he has an EF of 25-30%, moderate MR, moderate TR, mild AI.   Risks/benefits/alternatives were discussed at length (70% straightforward recovery, 25% morbidity [any organ, predominantly heart failure related issues, mobility/ fall, or renal - dialysis], 5%  mortality). Straightforward recovery typically entails 1-2 ICU days, 3-4 days on the floor, seeing me back in clinic at 1 week and 1 month postoperatively. No lifting greater than 30 lbs for 6 weeks. Total recovery expected by 2 months. All questions were asked and answered.   I reviewed his studies with a partner Roxan Hockey) as well. Thought reasonable to proceed but high risk. And obtain a viability study beforehand.   Cirrhotic appearance of liver by CT - LFTS and INR from recent hospitalization appear WNL.   Would elect for CAB x 3 (LIMA - LAD, SVG - OM and SVG - RCA) but the right is very small. The OM is also almost too small to graft. Would have to accept up front it may be a 1 vessel CAB LIMA to LAD.   He is not having any angina at this time.   Prior to surgery: Obtain MRI viability Obtain working prosthetic so can ambulate  I  spent 60 min with  the patient face to face in counseling and coordination of care.    Pierre Bali Trevontae Lindahl 07/02/2022 4:42 PM

## 2022-07-06 ENCOUNTER — Ambulatory Visit (INDEPENDENT_AMBULATORY_CARE_PROVIDER_SITE_OTHER): Payer: No Typology Code available for payment source | Admitting: Nurse Practitioner

## 2022-07-06 VITALS — BP 138/74 | HR 68 | Resp 16 | Wt 193.4 lb

## 2022-07-06 DIAGNOSIS — Z89512 Acquired absence of left leg below knee: Secondary | ICD-10-CM

## 2022-07-06 MED ORDER — DOXYCYCLINE HYCLATE 100 MG PO CAPS: 100.0000 mg | ORAL_CAPSULE | Freq: Two times a day (BID) | ORAL | 0 refills | Status: DC

## 2022-07-11 ENCOUNTER — Encounter: Payer: No Typology Code available for payment source | Admitting: Family

## 2022-07-13 ENCOUNTER — Institutional Professional Consult (permissible substitution): Payer: No Typology Code available for payment source | Admitting: Cardiothoracic Surgery

## 2022-07-13 ENCOUNTER — Other Ambulatory Visit: Payer: Self-pay | Admitting: *Deleted

## 2022-07-13 ENCOUNTER — Encounter: Payer: No Typology Code available for payment source | Admitting: Cardiothoracic Surgery

## 2022-07-13 ENCOUNTER — Other Ambulatory Visit: Payer: Self-pay | Admitting: Cardiothoracic Surgery

## 2022-07-13 VITALS — BP 132/74 | HR 68 | Resp 20 | Ht 68.0 in | Wt 190.0 lb

## 2022-07-13 DIAGNOSIS — I2510 Atherosclerotic heart disease of native coronary artery without angina pectoris: Secondary | ICD-10-CM

## 2022-07-14 ENCOUNTER — Other Ambulatory Visit: Payer: Self-pay | Admitting: *Deleted

## 2022-07-14 ENCOUNTER — Other Ambulatory Visit: Payer: Self-pay | Admitting: Cardiothoracic Surgery

## 2022-07-14 DIAGNOSIS — I2510 Atherosclerotic heart disease of native coronary artery without angina pectoris: Secondary | ICD-10-CM

## 2022-07-18 NOTE — Progress Notes (Signed)
Patient ID: [name], male    DOB: 10/04/1955, 67 y.o.   MRN: [id number]  HPI  Mr [name] is a 67 y/o male with a history of CAD, DM, HTN, CKD, nephrolithiasis, PAD, viral meningitis, tobacco use and chronic heart failure.   Echo report from 05/29/22 showed an EF of 25-30% along with mild LVH, severe hypokinesis, moderately elevated PA pressure of 51.3 mmHg, mild LAE, moderate MR/TR, mild AR/AS and moderate pleural effusion in left lateral region.   RHC/LHC done 06/02/22 and showed: Severe three-vessel coronary artery disease, as detailed below, including sequential 60-70% mid and distal LAD stenoses, sequential 40-60% mid and distal LCx disease, 80-90% lesion involving OM2, and diffusely diseased codominant RCA with 70-90% stenoses in the proximal/mid vessel. Moderately elevated left heart, right heart, and pulmonary artery pressures. Normal cardiac output/index.  Admitted 05/27/22 due to shortness of breath. Echo done and IV lasix given. Cardiology consult obtained. Cath done. CTA negative for PE. Status post right thoracocentesis 05/29/22 with transudative fluid. Elevated troponin thought to be due to demand ischemia. Discharged after 8 days.   He presents today for a follow-up visit with a chief complaint of minimal fatigue upon moderate exertion. Describes this as chronic. Has associated cough, SOB, pedal edema & easy bruising along with this. Denies any difficulty sleeping, abdominal distention, palpitations, chest pain, dizziness or weight gain.   Weighing himself at home by putting scales on a hard back chair and reports a stable weight. Checking BP at home and reports SBP 120's-130's.   Past Medical History:  Diagnosis Date   CAD (coronary artery disease)    a. 05/2022 Cath: LM nl, LAD 56m/100m, 70d, D3 90, LCX 69m, 60d, OM1 sev diff dzs, OM2 85p, OM2 subbranch sev diff dzs, RCA 70p/m, 13m.   CHF (congestive heart failure) (HCC)    Chronic HFrEF (heart failure with reduced ejection  fraction) (HCC)    a. 05/2022 Echo: EF 25-30%, mild LVH, GrII DD, mid-apical, anteroseptal, and apical HK. Nl RV fxn. RVSP 51.53mmHg. Mildly dil LA. Mod MR/TR. Mild AS/AI.   Diabetes mellitus    Hematospermia    Hypertension    Ischemic cardiomyopathy    a. 05/2022 Echo: EF 25-30%.   Moderate Mitral regurgitation    Moderate tricuspid regurgitation    Nephrolithiasis 02/07/2007   PAD (peripheral artery disease) (HCC)    a. 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   Viral meningitis    Past Surgical History:  Procedure Laterality Date   AMPUTATION Left 05/19/2022   Procedure: AMPUTATION BELOW KNEE;  Surgeon: Renford Dills, MD;  Location: [facility];  Service: Vascular;  Laterality: Left;   CHOLECYSTECTOMY  12-07-14   laparoscopic, per Dr. Rayburn Ma    LOWER EXTREMITY ANGIOGRAPHY Left 05/16/2022   Procedure: Lower Extremity Angiography;  Surgeon: Renford Dills, MD;  Location: [facility];  Service: Cardiovascular;  Laterality: Left;   RIGHT/LEFT HEART CATH AND CORONARY ANGIOGRAPHY N/A 06/02/2022   Procedure: RIGHT/LEFT HEART CATH AND CORONARY ANGIOGRAPHY;  Surgeon: Yvonne Kendall, MD;  Location: [facility];  Service: Cardiovascular;  Laterality: N/A;   Family History  Problem Relation Age of Onset   Rheum arthritis Mother    Diabetes Father    Heart disease Father    Cholecystitis Sister    Cholecystitis Brother    Diabetes Brother    Diabetes Brother    Parkinson's disease Brother    Heart disease Sister    Diabetes Sister    Cholecystitis Sister  Diabetes Sister    Arthritis Other    Hypertension Other    Stroke Other    Coronary artery disease Other    Birth defects Neg Hx    Social History   Tobacco Use   Smoking status: Former   Smokeless tobacco: Never   Tobacco comments:    22 yrs ago   Substance Use Topics   Alcohol use: No    Alcohol/week: 0.0 standard drinks of alcohol   No Known Allergies  Prior to Admission medications   Medication Sig Start  Date End Date Taking? Authorizing Provider  ACCU-CHEK GUIDE test strip USE TO TEST BLOOD SUGARS TWICE DAILY 06/15/22  Yes Nelwyn Salisbury, MD  Accu-Chek Softclix Lancets lancets daily. 12/31/19  Yes [provider]  ALPRAZolam (XANAX) 0.5 MG tablet TAKE 1 TABLET (0.5 MG TOTAL) BY MOUTH 3 (THREE) TIMES DAILY AS NEEDED FOR ANXIETY OR SLEEP. 05/03/22  Yes Nelwyn Salisbury, MD  aspirin EC 81 MG tablet Take 1 tablet (81 mg total) by mouth daily. Swallow whole. 05/24/22  Yes Delfino Lovett, MD  atorvastatin (LIPITOR) 20 MG tablet Take 1 tablet (20 mg total) by mouth daily. 06/23/22 06/18/23 Yes Flossie Dibble, NP  Blood Glucose Monitoring Suppl (ACCU-CHEK GUIDE ME) w/Device KIT USE AS DIRECTED EVERY DAY 12/31/19  Yes [provider]  clopidogrel (PLAVIX) 75 MG tablet Take 1 tablet (75 mg total) by mouth daily. 06/08/22  Yes Delma Freeze, FNP  Continuous Blood Gluc Sensor (DEXCOM G7 SENSOR) MISC See admin instructions. 05/02/22  Yes [provider]  dapagliflozin propanediol (FARXIGA) 10 MG TABS tablet Take 1 tablet (10 mg total) by mouth daily. 06/28/22  Yes Flossie Dibble, NP  insulin isophane & regular human KwikPen (NOVOLIN 70/30 KWIKPEN) (70-30) 100 UNIT/ML KwikPen Inject 25 Units into the skin 2 (two) times daily with a meal. 02/03/22  Yes Nelwyn Salisbury, MD  Insulin Pen Needle (BD PEN NEEDLE MICRO U/F) 32G X 6 MM MISC USE AS DIRECTED TO INJECT NOVILIN 05/08/22  Yes Nelwyn Salisbury, MD  levothyroxine (SYNTHROID) 50 MCG tablet TAKE 1 TABLET BY MOUTH EVERY DAY 06/26/22  Yes Nelwyn Salisbury, MD  levothyroxine (SYNTHROID) 50 MCG tablet Take 1 tablet (50 mcg total) by mouth daily. 06/26/22  Yes Nelwyn Salisbury, MD  losartan (COZAAR) 25 MG tablet Take 0.5 tablets (12.5 mg total) by mouth daily. 06/23/22 06/18/23 Yes Flossie Dibble, NP  Melatonin 10 MG TABS Take 1 tablet by mouth at bedtime.   Yes [provider]  metoprolol succinate (TOPROL-XL) 25 MG 24 hr tablet Take 0.5 tablets  (12.5 mg total) by mouth daily. 06/23/22  Yes Flossie Dibble, NP  spironolactone (ALDACTONE) 25 MG tablet Take 1 tablet (25 mg total) by mouth daily. 06/23/22  Yes Flossie Dibble, NP  Torsemide 40 MG TABS Take 40 mg by mouth as needed (AS NEEDED for weight gain of 3 pounds overnight or 5 pounds in a week). 06/26/22 07/26/22 Yes Flossie Dibble, NP    Review of Systems  Constitutional:  Positive for fatigue. Negative for appetite change.  HENT:  Negative for congestion, postnasal drip and sore throat.   Eyes: Negative.   Respiratory:  Positive for cough and shortness of breath (minimal). Negative for chest tightness.   Cardiovascular:  Positive for leg swelling. Negative for chest pain and palpitations.  Gastrointestinal:  Negative for abdominal distention and abdominal pain.  Endocrine: Negative.   Genitourinary: Negative.   Musculoskeletal:  Negative for back pain and neck pain.  Skin: Negative.   Allergic/Immunologic: Negative.   Neurological:  Negative for dizziness and light-headedness.  Hematological:  Negative for adenopathy. Bruises/bleeds easily.  Psychiatric/Behavioral:  Negative for dysphoric mood and sleep disturbance (sleeping on 2.5 pillows). The patient is not nervous/anxious.    Vitals:   07/19/22 0928  BP: (!) 157/66  Pulse: 76  Resp: 20  SpO2: 100%  Weight: 193 lb 5 oz (87.7 kg)   Wt Readings from Last 3 Encounters:  07/19/22 193 lb 5 oz (87.7 kg)  07/13/22 190 lb (86.2 kg)  07/06/22 193 lb 6.4 oz (87.7 kg)   Lab Results  Component Value Date   CREATININE 1.97 (H) 06/23/2022   CREATININE 1.92 (H) 06/14/2022   CREATININE 1.34 (H) 06/04/2022   Physical Exam Vitals and nursing note reviewed. Exam conducted with a chaperone present (wife).  Constitutional:      Appearance: Normal appearance.  HENT:     Head: Normocephalic and atraumatic.  Cardiovascular:     Rate and Rhythm: Normal rate and regular rhythm.  Pulmonary:     Effort: Pulmonary effort is  normal.     Breath sounds: No wheezing, rhonchi or rales.  Abdominal:     General: There is no distension.     Palpations: Abdomen is soft.     Tenderness: There is no abdominal tenderness.  Musculoskeletal:        General: Deformity (left BKA) present.     Cervical back: Normal range of motion and neck supple.     Right lower leg: Edema (1+ pitting) present.  Skin:    General: Skin is warm and dry.  Neurological:     General: No focal deficit present.     Mental Status: He is alert and oriented to person, place, and time.  Psychiatric:        Mood and Affect: Mood normal.        Behavior: Behavior normal.        Thought Content: Thought content normal.   Assessment & Plan:  1: Chronic heart failure with reduced ejection fraction-  - NYHA class II - euvolemic today - weighing daily by putting scale in a hardback chair and reports a stable weight; reminded to call for an overnight weight gain of > 2 pounds or a weekly weight gain of > 5 pounds - weight 193.5 pounds at home; did not weigh in office today - not adding salt to his food - on GDMT of farxiga, losartan, metoprolol and spironolactone - BP slightly elevated today; has been hypotensive in the past - drinking 3 water bottles daily (16.9 oz each) - wearing compression sock on his right leg - does elevate his leg some during the day - saw cardiothoracic surgeon but would like another opinion regarding his HF and any options; discussed referral to [facility] provider and they are agreeable and they request Dr. Gala Romney as he saw patient during December admission - BNP 05/27/22 was 1547.0 - PharmD reconciled meds w/ patient - research nurse spoke to patient regarding Analog study and given consent form to read; he is going to take it home to review  2: HTN- - BP 157/66 - saw PCP Clent Ridges) 06/14/22 - BMP 06/23/22 reviewed and showed sodium 134, potassium 4.8, creatinine 1.97 & GFR 34 - BMP today   3: DM- - A1c 05/21/22 was 6.7% -  home glucose this morning was 103  4: CAD- - on atorvastatin  - saw cardiothoracic surgeon (  [facility]) 07/13/22 - saw cardiology Elliot Gurney) 06/23/22  5: PAD- - left BKA 05/19/22 - saw vascular Manson Passey) 07/06/22   Medication bottles reviewed.   Return in 3 weeks, sooner if needed.

## 2022-07-19 ENCOUNTER — Encounter: Payer: Self-pay | Admitting: Family

## 2022-07-19 ENCOUNTER — Other Ambulatory Visit
Admission: RE | Admit: 2022-07-19 | Discharge: 2022-07-19 | Disposition: A | Discharge status: Home / Self Care | Payer: No Typology Code available for payment source | Source: Ambulatory Visit | Attending: Family | Admitting: Family

## 2022-07-19 ENCOUNTER — Encounter: Payer: No Typology Code available for payment source | Admitting: Family

## 2022-07-19 ENCOUNTER — Encounter: Payer: Self-pay | Admitting: *Deleted

## 2022-07-19 ENCOUNTER — Ambulatory Visit (HOSPITAL_BASED_OUTPATIENT_CLINIC_OR_DEPARTMENT_OTHER): Payer: No Typology Code available for payment source | Admitting: Family

## 2022-07-19 VITALS — BP 157/66 | HR 76 | Resp 20 | Wt 193.3 lb

## 2022-07-19 DIAGNOSIS — E1159 Type 2 diabetes mellitus with other circulatory complications: Secondary | ICD-10-CM

## 2022-07-19 DIAGNOSIS — I5022 Chronic systolic (congestive) heart failure: Secondary | ICD-10-CM

## 2022-07-19 DIAGNOSIS — I10 Essential (primary) hypertension: Secondary | ICD-10-CM

## 2022-07-19 DIAGNOSIS — Z794 Long term (current) use of insulin: Secondary | ICD-10-CM

## 2022-07-19 DIAGNOSIS — I739 Peripheral vascular disease, unspecified: Secondary | ICD-10-CM

## 2022-07-19 DIAGNOSIS — I25118 Atherosclerotic heart disease of native coronary artery with other forms of angina pectoris: Secondary | ICD-10-CM

## 2022-07-19 DIAGNOSIS — Z006 Encounter for examination for normal comparison and control in clinical research program: Secondary | ICD-10-CM

## 2022-07-19 LAB — BASIC METABOLIC PANEL
Anion gap: 7 (ref 5–15)
BUN: 26 mg/dL — ABNORMAL HIGH (ref 8–23)
CO2: 25 mmol/L (ref 22–32)
Calcium: 8.8 mg/dL — ABNORMAL LOW (ref 8.9–10.3)
Chloride: 107 mmol/L (ref 98–111)
Creatinine, Ser: 1.21 mg/dL (ref 0.61–1.24)
GFR, Estimated: >60 mL/min (ref >60)
Glucose, Bld: 98 mg/dL (ref 70–99)
Potassium: 4 mmol/L (ref 3.5–5.1)
Sodium: 139 mmol/L (ref 135–145)

## 2022-07-19 NOTE — Progress Notes (Unsigned)
Cardiology Office Note    Date:  07/25/2022   ID:  RIEL HIRSCHMAN, DOB [birth year], MRN [id number]  PCP:  Laurey Morale, MD  Cardiologist:  Ida Rogue, MD  Electrophysiologist:  None   Chief Complaint: Follow up  History of Present Illness:   IZAIH KATAOKA is a 67 y.o. 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  Echo in 01/2020 showed an EF of 55 to 60%, no regional wall motion abnormalities, mild LVH, grade 1 diastolic dysfunction, normal RV systolic function, mild to moderate aortic valve sclerosis without evidence of stenosis, and an estimated right atrial pressure of 3 mmHg.  He underwent a left BKA on 05/19/2022.  A couple of days later, he was readmitted with worsening dyspnea with acute hypoxic respiratory failure in the setting of acute combined systolic and diastolic CHF, CAP, and supply/demand ischemia with a high-sensitivity troponin of 803.  Echo 05/2021 showed an EF of 25 to 30% with severe hypokinesis of the mid apical anteroseptal wall, anterior wall, and apical segment, mild LVH, grade 2 diastolic dysfunction, normal RV systolic function and ventricular cavity size, elevated PASP estimated at 51.3 mmHg, mildly dilated left atrium, moderate pleural effusion in the left lateral region, moderate mitral regurgitation with moderate mitral annular calcification, moderate tricuspid regurgitation, calcified aortic valve mild insufficiency and at least mild stenosis, and an estimated right atrial pressure of 8 mmHg.  He underwent right thoracentesis with 1 point liter of fluid removed.  R/LHC in 05/2022 showed severe three-vessel CAD including sequential 60 to 70% mid and distal LAD stenoses, sequential 40 to 60% mid and distal LCx disease, 80 to 90% stenosis involving OM 2, and diffusely diseased  codominant RCA with 70 to 90% stenoses in the proximal/mid vessel.  Moderately elevated left heart, right heart, and pulmonary artery pressures with normal cardiac output/index.  It was recommended to continue diuresis with escalation of GDMT and for the patient to be evaluated by cardiothoracic surgery for consideration of CABG in the outpatient setting given his CAD appeared chronic.  His elevated troponin on admission was felt to most likely represent supply/demand mismatch in the setting of acute HFrEF and fixed coronary disease.  Was last seen in our office on 06/23/2022 and was without symptoms of angina or decompensation.  Uptitration of GDMT was limited by relative hypotension, leading to the decreasing of his metoprolol and transitioning of torsemide to as needed.  He was evaluated by [facility] on 07/13/2022 reasonable for three-vessel CABG, though high risk.  They have recommended cardiac MRI for viability before hand which is currently scheduled for 10/13/2022.  Pre-CABG vascular imaging showed 1 to 39% bilateral ICA stenoses, antegrade flow of the bilateral vertebral arteries, and normal right ABI with noted history of left AKA.  Right upper extremity waveform decreased 50% with radial and ulnar compression with normal waveforms in the left upper extremity.  He comes in accompanied by his wife today and is doing well from a cardiac perspective.  No symptoms of angina or cardiac decompensation.  Weight has remained stable by his home scale.  No lower extremity swelling, stump swelling, abdominal distention, or progressive orthopnea.  He has not needed any as needed torsemide.  Tolerating cardiac medications without issues.  Working on getting fitted for a lower extremity prosthesis in the near future.  He would like to see how his symptoms trend once he is able to be more ambulatory, and see how his renal function trends prior to making a final decision on coronary revascularization.     Labs  independently reviewed: 06/2022 - potassium 4.0, BUN 26, serum creatinine 1.21 05/2022 - Hgb 12.5, PLT 160, magnesium 2.6, TC 93, TG 72, HDL 28, LDL 51, TSH 5.605, albumin 2.8, AST/ALT normal, A1c 6.7  Past Medical History:  Diagnosis Date   CAD (coronary artery disease)    a. 05/2022 Cath: LM nl, LAD 82m/28m, 70d, D3 90, LCX 1m, 60d, OM1 sev diff dzs, OM2 85p, OM2 subbranch sev diff dzs, RCA 70p/m, 40m.   CHF (congestive heart failure) (HCC)    Chronic HFrEF (heart failure with reduced ejection fraction) ([place])    a. 05/2022 Echo: EF 25-30%, mild LVH, GrII DD, mid-apical, anteroseptal, and apical HK. Nl RV fxn. RVSP 51.66mmHg. Mildly dil LA. Mod MR/TR. Mild AS/AI.   Diabetes mellitus    Hematospermia    Hypertension    Ischemic cardiomyopathy    a. 05/2022 Echo: EF 25-30%.   Moderate Mitral regurgitation    Moderate tricuspid regurgitation    Nephrolithiasis 02/07/2007   PAD (peripheral artery disease) ([place])    a. 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   Viral meningitis     Past Surgical History:  Procedure Laterality Date   AMPUTATION Left 05/19/2022   Procedure: AMPUTATION BELOW KNEE;  Surgeon: Katha Cabal, MD;  Location: [facility] ORS;  Service: Vascular;  Laterality: Left;   CHOLECYSTECTOMY  12-07-14   laparoscopic, per Dr. Rush Farmer    LOWER EXTREMITY ANGIOGRAPHY Left 05/16/2022   Procedure: Lower Extremity Angiography;  Surgeon: Katha Cabal, MD;  Location: [place] [facility];  Service: Cardiovascular;  Laterality: Left;   RIGHT/LEFT HEART CATH AND CORONARY ANGIOGRAPHY N/A 06/02/2022   Procedure: RIGHT/LEFT HEART CATH AND CORONARY ANGIOGRAPHY;  Surgeon: Nelva Bush, MD;  Location: [place] [facility];  Service: Cardiovascular;  Laterality: N/A;    Current Medications: Current Meds  Medication Sig   ACCU-CHEK GUIDE test strip USE TO TEST BLOOD SUGARS TWICE DAILY   Accu-Chek Softclix Lancets lancets daily.   ALPRAZolam (XANAX) 0.5 MG tablet TAKE 1 TABLET (0.5 MG TOTAL) BY MOUTH  3 (THREE) TIMES DAILY AS NEEDED FOR ANXIETY OR SLEEP.   aspirin EC 81 MG tablet Take 1 tablet (81 mg total) by mouth daily. Swallow whole.   atorvastatin (LIPITOR) 20 MG tablet Take 1 tablet (20 mg total) by mouth daily.   Blood Glucose Monitoring Suppl (ACCU-CHEK GUIDE ME) w/Device KIT USE AS DIRECTED EVERY DAY   clopidogrel (PLAVIX) 75 MG tablet Take 1 tablet (75 mg total) by mouth daily.   Continuous Blood Gluc Sensor (DEXCOM G7 SENSOR) MISC See admin instructions.   dapagliflozin propanediol (FARXIGA) 10 MG TABS tablet Take 1 tablet (10 mg total) by mouth daily.   insulin isophane & regular human KwikPen (NOVOLIN 70/30 KWIKPEN) (70-30) 100 UNIT/ML KwikPen Inject 25 Units into the skin 2 (two) times daily with a meal.   Insulin Pen Needle (BD PEN NEEDLE MICRO U/F) 32G X 6 MM MISC USE AS DIRECTED TO INJECT NOVILIN   levothyroxine (SYNTHROID) 50 MCG tablet TAKE 1 TABLET BY MOUTH EVERY DAY   losartan (COZAAR) 25 MG tablet Take 0.5 tablets (12.5 mg total) by mouth daily.   Melatonin 10 MG TABS Take 1 tablet by mouth at bedtime.   metoprolol succinate (TOPROL-XL) 25 MG 24 hr tablet Take 0.5 tablets (  12.5 mg total) by mouth daily.   spironolactone (ALDACTONE) 25 MG tablet Take 1 tablet (25 mg total) by mouth daily.   Torsemide 40 MG TABS Take 40 mg by mouth as needed (AS NEEDED for weight gain of 3 pounds overnight or 5 pounds in a week).    Allergies:   Patient has no known allergies.   Social History   Socioeconomic History   Marital status: Married    Spouse name: Not on file   Number of children: 2   Years of education: Not on file   Highest education level: Not on file  Occupational History   Not on file  Tobacco Use   Smoking status: Former   Smokeless tobacco: Never   Tobacco comments:    22 yrs ago   Substance and Sexual Activity   Alcohol use: No    Alcohol/week: 0.0 standard drinks of alcohol   Drug use: No   Sexual activity: Not on file  Other Topics Concern   Not on  file  Social History Narrative   Right Handed   Drinks little caffeine - only diet sodas if so   One Story Home    5 Grandchildren   Social Determinants of Health   Financial Resource Strain: Unknown (01/01/2022)   Overall Financial Resource Strain (CARDIA)    Difficulty of Paying Living Expenses: Patient refused  Food Insecurity: Unknown (05/16/2022)   Hunger Vital Sign    Worried About Running Out of Food in the Last Year: Patient refused    [place] in the Last Year: Patient refused  Transportation Needs: Unknown (05/16/2022)   [place] - Transportation    Lack of Transportation (Medical): Patient refused    Lack of Transportation (Non-Medical): Patient refused  Physical Activity: Unknown (01/01/2022)   Exercise Vital Sign    Days of Exercise per Week: Patient refused    Minutes of Exercise per Session: Not on file  Stress: Unknown (01/01/2022)   [place]    Feeling of Stress : Patient refused  Social Connections: Unknown (01/01/2022)   Social Connection and Isolation Panel [NHANES]    Frequency of Communication with Friends and Family: Patient refused    Frequency of Social Gatherings with Friends and Family: Patient refused    Attends Religious Services: Patient refused    Marine scientist or Organizations: Patient refused    Attends Archivist Meetings: Not on file    Marital Status: Patient refused     Family History:  The patient's family history includes Arthritis in an other family member; Cholecystitis in his brother, sister, and sister; Coronary artery disease in an other family member; Diabetes in his brother, brother, father, sister, and sister; Heart disease in his father and sister; Hypertension in an other family member; Parkinson's disease in his brother; Rheum arthritis in his mother; Stroke in an other family member. There is no history of Birth defects.  ROS:   12-point  review of systems is negative unless otherwise noted in HPI.   EKGs/Labs/Other Studies Reviewed:    Studies reviewed were summarized above. The additional studies were reviewed today:  2D echo 02/09/2020: 1. Left ventricular ejection fraction, by estimation, is 55 to 60%. The  left ventricle has normal function. The left ventricle has no regional  wall motion abnormalities. There is mild left ventricular hypertrophy.  Left ventricular diastolic parameters  are consistent with Grade I diastolic dysfunction (impaired relaxation).  2. Right ventricular systolic function is normal. The right ventricular  size is normal. Tricuspid regurgitation signal is inadequate for assessing  PA pressure.   3. The mitral valve is normal in structure. No evidence of mitral valve  regurgitation. No evidence of mitral stenosis.   4. The aortic valve is normal in structure. Aortic valve regurgitation is  not visualized. Mild to moderate aortic valve sclerosis/calcification is  present, without any evidence of aortic stenosis.   5. The inferior vena cava is normal in size with greater than 50%  respiratory variability, suggesting right atrial pressure of 3 mmHg.  __________  2D echo 05/29/2022: 1. Left ventricular ejection fraction, by estimation, is 25 to 30%. The  left ventricle has severely decreased function. The left ventricle  demonstrates regional wall motion abnormalities (see scoring  diagram/findings for description). There is mild left   ventricular hypertrophy. Left ventricular diastolic parameters are  consistent with Grade II diastolic dysfunction (pseudonormalization).  There is severe hypokinesis of the left ventricular, mid-apical  anteroseptal wall, anterior wall and apical segment.   2. Right ventricular systolic function is normal. The right ventricular  size is normal. There is moderately elevated pulmonary artery systolic  pressure. The estimated right ventricular systolic pressure  is 51.3 mmHg.   3. Left atrial size was mildly dilated.   4. Moderate pleural effusion in the left lateral region.   5. The mitral valve is normal in structure. Moderate mitral valve  regurgitation. No evidence of mitral stenosis. Moderate mitral annular  calcification.   6. Tricuspid valve regurgitation is moderate.   7. The aortic valve is calcified. Aortic valve regurgitation is mild.  Mild aortic valve stenosis. Suspect at least mild aortic stenosis. An  accurate gradient was not obtained.   8. The inferior vena cava is normal in size with <50% respiratory  variability, suggesting right atrial pressure of 8 mmHg.   Comparison(s): A prior study was performed on 02/09/2020. Prior images  reviewed side by side. Changes from prior study are noted. Significant  drop in EF with new wall motion abnormalities.  __________  [facility] 06/02/2022: Conclusions: Severe three-vessel coronary artery disease, as detailed below, including sequential 60-70% mid and distal LAD stenoses, sequential 40-60% mid and distal LCx disease, 80-90% lesion involving OM2, and diffusely diseased codominant RCA with 70-90% stenoses in the proximal/mid vessel. Moderately elevated left heart, right heart, and pulmonary artery pressures. Normal cardiac output/index.   Recommendations: Continue diuresis and escalation of goal-directed medical therapy for acute HFrEF due to ischemic cardiomyopathy. Consider outpatient cardiac surgery consultation for consideration of CABG, as coronary disease appears chronic.  Elevated troponin on admission most likely represents supply-demand mismatch in the setting of acute HFrEF and fixed coronary disease.  If the patient is not a candidate for CABG, PCI to mid LAD +/- OM2 could be considered.  Codominant RCA is likely too small for percutaneous intervention. Aggressive secondary prevention of coronary artery disease.    EKG:  EKG is not ordered today.    Recent Labs: 05/27/2022: ALT  24; B Natriuretic Peptide 1,547.0 05/30/2022: TSH 5.605 06/14/2022: Hemoglobin 12.5; Magnesium 2.6; Platelets 160.0 07/19/2022: BUN 26; Creatinine, Ser 1.21; Potassium 4.0; Sodium 139  Recent Lipid Panel    Component Value Date/Time   CHOL 93 05/30/2022 0517   TRIG 72 05/30/2022 0517   HDL 28 (L) 05/30/2022 0517   CHOLHDL 3.3 05/30/2022 0517   VLDL 14 05/30/2022 0517   LDLCALC 51 05/30/2022 0517    PHYSICAL EXAM:  VS:  BP 110/60 (BP Location: Right Arm, Patient Position: Sitting, Cuff Size: Normal)   Pulse 77   Ht 5\' 8"  (1.727 m)   Wt 192 lb 11.2 oz (87.4 kg)   SpO2 100%   BMI 29.30 kg/m   BMI: Body mass index is 29.3 kg/m.  Physical Exam Vitals reviewed.  Constitutional:      Appearance: He is well-developed.  HENT:     Head: Normocephalic and atraumatic.  Eyes:     General:        Right eye: No discharge.        Left eye: No discharge.  Neck:     Vascular: No JVD.  Cardiovascular:     Rate and Rhythm: Normal rate and regular rhythm.     Heart sounds: Normal heart sounds, S1 normal and S2 normal. Heart sounds not distant. No midsystolic click and no opening snap. No murmur heard.    No friction rub.  Pulmonary:     Effort: Pulmonary effort is normal. No respiratory distress.     Breath sounds: Normal breath sounds. No decreased breath sounds, wheezing or rales.  Chest:     Chest wall: No tenderness.  Abdominal:     General: There is no distension.     Palpations: Abdomen is soft.     Tenderness: There is no abdominal tenderness.  Musculoskeletal:     Cervical back: Normal range of motion.     Right lower leg: No edema.     Comments: Status post left BKA.  Skin:    General: Skin is warm and dry.     Nails: There is no clubbing.  Neurological:     Mental Status: He is alert and oriented to person, place, and time.  Psychiatric:        Speech: Speech normal.        Behavior: Behavior normal.        Thought Content: Thought content normal.         Judgment: Judgment normal.     Wt Readings from Last 3 Encounters:  07/25/22 192 lb 11.2 oz (87.4 kg)  07/19/22 193 lb 5 oz (87.7 kg)  07/13/22 190 lb (86.2 kg)     ASSESSMENT & PLAN:   Multivessel CAD: He is doing well and is without symptoms of angina or cardiac decompensation.  Continue aggressive risk factor modification and current medical therapy including aspirin, atorvastatin, clopidogrel, losartan, and Toprol-XL.  Cardiac MRI currently scheduled for 09/2022.  He does not want to move this up as he would like to see how his symptoms trend once he is more ambulatory following prosthesis fitting and physical therapy.  He would like to revisit with [facility] and interventional cardiology after cardiac MRI for further recommendations regarding revascularization options.  Chronic combined systolic and diastolic CHF secondary to ICM: He appears euvolemic and well compensated.  He remains on losartan, Toprol XL, spironolactone, Farxiga, and as needed torsemide.  Recent labs showed stable electrolytes and improved renal function.  PAD status post left BKA: Per vascular surgery.  He is working through the process and getting fitted for a prosthesis.  HTN: Blood pressure well-controlled in the office today.  Continue medical therapy as outlined above.  HLD: LDL 51, triglyceride 72.  He remains on atorvastatin 80 mg.  DM2 with complications: A1c 6.7% follow-up with PCP.  This was not discussed today.  AKI: Renal function improved on recent check.   Hypothyroidism: On replacement therapy.  Anemia: Hgb improved to  12.5 on most recent check.  No symptoms concerning for bleeding.   Disposition: F/u with Dr. Okey Dupre (interventional cardiology) post cMRI.   Medication Adjustments/Labs and Tests Ordered: Current medicines are reviewed at length with the patient today.  Concerns regarding medicines are outlined above. Medication changes, Labs and Tests ordered today are summarized above and listed in  the Patient Instructions accessible in Encounters.   Signed, Eula Listen, PA-C 07/25/2022 5:07 PM     [facility] [place] [address] [place], [place] [address] [phone]

## 2022-07-19 NOTE — Research (Signed)
Spoke with patient about analog HF study He was interested but wanted to discuss with family.  He is seeing Dr Haroldine Laws in 3 weeks will touch base with him then to see if he is interested.  Patient has ICF to review

## 2022-07-19 NOTE — Patient Instructions (Signed)
Continue weighing daily and call for an overnight weight gain of 3 pounds or more or a weekly weight gain of more than 5 pounds.   If you have voicemail, please make sure your mailbox is cleaned out so that we may leave a message and please make sure to listen to any voicemails.     

## 2022-07-21 ENCOUNTER — Encounter (INDEPENDENT_AMBULATORY_CARE_PROVIDER_SITE_OTHER): Payer: Self-pay | Admitting: Nurse Practitioner

## 2022-07-21 ENCOUNTER — Encounter (INDEPENDENT_AMBULATORY_CARE_PROVIDER_SITE_OTHER): Payer: Self-pay

## 2022-07-21 ENCOUNTER — Ambulatory Visit (HOSPITAL_COMMUNITY)
Admission: RE | Admit: 2022-07-21 | Discharge: 2022-07-21 | Disposition: A | Discharge status: Home / Self Care | Payer: No Typology Code available for payment source | Source: Ambulatory Visit | Attending: Cardiothoracic Surgery | Admitting: Cardiothoracic Surgery

## 2022-07-21 DIAGNOSIS — I2510 Atherosclerotic heart disease of native coronary artery without angina pectoris: Secondary | ICD-10-CM | POA: Insufficient documentation

## 2022-07-21 NOTE — Progress Notes (Signed)
Subjective:    Patient ID: [name], male    DOB: 07/08/1955, 67 y.o.   MRN: [id number] Chief Complaint  Patient presents with   Follow-up    2 week follow up    [name] is a 67 year old male who underwent amputation of his left lower extremity on 05/19/2022.  He had a left below-knee amputation.  This was due to an extensive nonhealing ulceration.  Since that time the patient has been doing well.  He denies any significant phantom leg pain.  The wound is completely healed at this point.  There has been no evidence of dehiscence or infection to the wound itself.  At this time the patient will be able to progress to begin wearing a shrinker sock as well as to begin fitting for his prosthetic limb.    Review of Systems  Skin:  Positive for wound.  Neurological:  Positive for weakness.  All other systems reviewed and are negative.      Objective:   Physical Exam Vitals reviewed.  HENT:     Head: Normocephalic.  Cardiovascular:     Rate and Rhythm: Normal rate.  Pulmonary:     Effort: Pulmonary effort is normal.  Skin:    General: Skin is warm and dry.  Neurological:     Mental Status: He is alert and oriented to person, place, and time.     Gait: Gait abnormal.  Psychiatric:        Mood and Affect: Mood normal.        Behavior: Behavior normal.        Thought Content: Thought content normal.        Judgment: Judgment normal.     BP 138/74 (BP Location: Left Arm)   Pulse 68   Resp 16   Wt 193 lb 6.4 oz (87.7 kg)   BMI 29.41 kg/m   Past Medical History:  Diagnosis Date   CAD (coronary artery disease)    a. 05/2022 Cath: LM nl, LAD 61m/31m, 70d, D3 90, LCX 20m, 60d, OM1 sev diff dzs, OM2 85p, OM2 subbranch sev diff dzs, RCA 70p/m, 46m.   CHF (congestive heart failure) (HCC)    Chronic HFrEF (heart failure with reduced ejection fraction) ([place])    a. 05/2022 Echo: EF 25-30%, mild LVH, GrII DD, mid-apical, anteroseptal, and apical HK. Nl RV fxn. RVSP 51.97mmHg.  Mildly dil LA. Mod MR/TR. Mild AS/AI.   Diabetes mellitus    Hematospermia    Hypertension    Ischemic cardiomyopathy    a. 05/2022 Echo: EF 25-30%.   Moderate Mitral regurgitation    Moderate tricuspid regurgitation    Nephrolithiasis 02/07/2007   PAD (peripheral artery disease) ([place])    a. 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   Viral meningitis     Social History   Socioeconomic History   Marital status: Married    Spouse name: Not on file   Number of children: 2   Years of education: Not on file   Highest education level: Not on file  Occupational History   Not on file  Tobacco Use   Smoking status: Former   Smokeless tobacco: Never   Tobacco comments:    22 yrs ago   Substance and Sexual Activity   Alcohol use: No    Alcohol/week: 0.0 standard drinks of alcohol   Drug use: No   Sexual activity: Not on file  Other Topics Concern   Not on file  Social History Narrative   Right  Handed   Drinks little caffeine - only diet sodas if so   One Story Home    5 Grandchildren   Social Determinants of Health   Financial Resource Strain: Unknown (01/01/2022)   Overall Financial Resource Strain (CARDIA)    Difficulty of Paying Living Expenses: Patient refused  Food Insecurity: Unknown (05/16/2022)   Hunger Vital Sign    Worried About Running Out of Food in the Last Year: Patient refused    Ran Out of Food in the Last Year: Patient refused  Transportation Needs: Unknown (05/16/2022)   PRAPARE - Transportation    Lack of Transportation (Medical): Patient refused    Lack of Transportation (Non-Medical): Patient refused  Physical Activity: Unknown (01/01/2022)   Exercise Vital Sign    Days of Exercise per Week: Patient refused    Minutes of Exercise per Session: Not on file  Stress: Unknown (01/01/2022)   [employer] of Occupational Health - Occupational Stress Questionnaire    Feeling of Stress : Patient refused  Social Connections: Unknown (01/01/2022)   Social Connection and  Isolation Panel [NHANES]    Frequency of Communication with Friends and Family: Patient refused    Frequency of Social Gatherings with Friends and Family: Patient refused    Attends Religious Services: Patient refused    Active Member of Clubs or Organizations: Patient refused    Attends Banker Meetings: Not on file    Marital Status: Patient refused  Intimate Partner Violence: Not At Risk (05/16/2022)   Humiliation, Afraid, Rape, and Kick questionnaire    Fear of Current or Ex-Partner: No    Emotionally Abused: No    Physically Abused: No    Sexually Abused: No    Past Surgical History:  Procedure Laterality Date   AMPUTATION Left 05/19/2022   Procedure: AMPUTATION BELOW KNEE;  Surgeon: Renford Dills, MD;  Location: [facility] ORS;  Service: Vascular;  Laterality: Left;   CHOLECYSTECTOMY  12-07-14   laparoscopic, per Dr. Rayburn Ma    LOWER EXTREMITY ANGIOGRAPHY Left 05/16/2022   Procedure: Lower Extremity Angiography;  Surgeon: Renford Dills, MD;  Location: [facility] INVASIVE CV LAB;  Service: Cardiovascular;  Laterality: Left;   RIGHT/LEFT HEART CATH AND CORONARY ANGIOGRAPHY N/A 06/02/2022   Procedure: RIGHT/LEFT HEART CATH AND CORONARY ANGIOGRAPHY;  Surgeon: Yvonne Kendall, MD;  Location: [facility] INVASIVE CV LAB;  Service: Cardiovascular;  Laterality: N/A;    Family History  Problem Relation Age of Onset   Rheum arthritis Mother    Diabetes Father    Heart disease Father    Cholecystitis Sister    Cholecystitis Brother    Diabetes Brother    Diabetes Brother    Parkinson's disease Brother    Heart disease Sister    Diabetes Sister    Cholecystitis Sister    Diabetes Sister    Arthritis Other    Hypertension Other    Stroke Other    Coronary artery disease Other    Birth defects Neg Hx     No Known Allergies     Latest Ref Rng & Units 06/14/2022   11:52 AM 06/04/2022    5:37 AM 06/03/2022    6:26 AM  CBC  WBC 4.0 - 10.5 K/uL 7.4  7.3  8.3    Hemoglobin 13.0 - 17.0 g/dL 20.2  9.1  9.3   Hematocrit 39.0 - 52.0 % 37.6  28.9  28.8   Platelets 150.0 - 400.0 K/uL 160.0  224  243  CMP     Component Value Date/Time   NA 139 07/19/2022 1114   K 4.0 07/19/2022 1114   CL 107 07/19/2022 1114   CO2 25 07/19/2022 1114   GLUCOSE 98 07/19/2022 1114   BUN 26 (H) 07/19/2022 1114   CREATININE 1.21 07/19/2022 1114   CREATININE 1.31 (H) 03/15/2020 1700   CALCIUM 8.8 (L) 07/19/2022 1114   PROT 7.1 05/28/2022 0505   ALBUMIN 2.8 (L) 05/27/2022 1846   AST 23 05/27/2022 1846   ALT 24 05/27/2022 1846   ALKPHOS 95 05/27/2022 1846   BILITOT 0.8 05/27/2022 1846   GFRNONAA >60 07/19/2022 1114   GFRAA >60 01/10/2020 1107     No results found.     Assessment & Plan:   1. S/P BKA (below knee amputation) unilateral, left 05/19/2022 [facility]) Mr. Heinlen was seen for further evaluation for left  below knee prosthesis.  He is a 67 year old male who had amputation on 05/19/2022.  At the time he is well healed and ready for fitting of new below knee prosthesis.  He is a highly motivated individual and should do well once fitted with prosthesis.  She has no problem returning to a K2 ambulator, which will allow him to walk inside and outside of his home and overload level barriers.  Based on needing physical therapy and obtaining his prosthetic.  I estimate it would likely be another 4 to 5 months before he can walk well enough to return to work.   Current Outpatient Medications on File Prior to Visit  Medication Sig Dispense Refill   ACCU-CHEK GUIDE test strip USE TO TEST BLOOD SUGARS TWICE DAILY 200 strip 3   Accu-Chek Softclix Lancets lancets daily.     ALPRAZolam (XANAX) 0.5 MG tablet TAKE 1 TABLET (0.5 MG TOTAL) BY MOUTH 3 (THREE) TIMES DAILY AS NEEDED FOR ANXIETY OR SLEEP. 270 tablet 1   aspirin EC 81 MG tablet Take 1 tablet (81 mg total) by mouth daily. Swallow whole. 30 tablet 12   atorvastatin (LIPITOR) 20 MG tablet Take 1 tablet (20 mg  total) by mouth daily. 90 tablet 3   Blood Glucose Monitoring Suppl (ACCU-CHEK GUIDE ME) w/Device KIT USE AS DIRECTED EVERY DAY     clopidogrel (PLAVIX) 75 MG tablet Take 1 tablet (75 mg total) by mouth daily. 30 tablet 3   Continuous Blood Gluc Sensor (DEXCOM G7 SENSOR) MISC See admin instructions.     dapagliflozin propanediol (FARXIGA) 10 MG TABS tablet Take 1 tablet (10 mg total) by mouth daily. 90 tablet 3   insulin isophane & regular human KwikPen (NOVOLIN 70/30 KWIKPEN) (70-30) 100 UNIT/ML KwikPen Inject 25 Units into the skin 2 (two) times daily with a meal. 300 mL 3   Insulin Pen Needle (BD PEN NEEDLE MICRO U/F) 32G X 6 MM MISC USE AS DIRECTED TO INJECT NOVILIN 200 each 1   levothyroxine (SYNTHROID) 50 MCG tablet TAKE 1 TABLET BY MOUTH EVERY DAY 90 tablet 0   levothyroxine (SYNTHROID) 50 MCG tablet Take 1 tablet (50 mcg total) by mouth daily. 90 tablet 0   losartan (COZAAR) 25 MG tablet Take 0.5 tablets (12.5 mg total) by mouth daily. 45 tablet 3   metoprolol succinate (TOPROL-XL) 25 MG 24 hr tablet Take 0.5 tablets (12.5 mg total) by mouth daily. 45 tablet 3   spironolactone (ALDACTONE) 25 MG tablet Take 1 tablet (25 mg total) by mouth daily. 90 tablet 3   Torsemide 40 MG TABS Take 40 mg by mouth as needed (  AS NEEDED for weight gain of 3 pounds overnight or 5 pounds in a week). 90 tablet 3   No current facility-administered medications on file prior to visit.    There are no Patient Instructions on file for this visit. No follow-ups on file.   Kris Hartmann, NP

## 2022-07-21 NOTE — Progress Notes (Signed)
Pre CABG has been completed.   Preliminary results in CV Proc.   [name] 07/21/2022 3:43 PM

## 2022-07-22 LAB — VAS US DOPPLER PRE CABG: Right ABI: 1.02

## 2022-07-23 ENCOUNTER — Encounter (INDEPENDENT_AMBULATORY_CARE_PROVIDER_SITE_OTHER): Payer: Self-pay

## 2022-07-24 NOTE — Telephone Encounter (Signed)
I called and discussed the concerns of the ultrasound with the patient.  We also discussed the wound as mentioned in his other message as well.  Patient is advised to use hydrocolloid Band-Aid over the wound.  Currently no further workup given the studies that he has recently had.  We will plan on keeping his follow-up unless he needs to move it up sooner.

## 2022-07-25 ENCOUNTER — Ambulatory Visit: Payer: No Typology Code available for payment source | Attending: Physician Assistant | Admitting: Physician Assistant

## 2022-07-25 ENCOUNTER — Encounter: Payer: Self-pay | Admitting: Physician Assistant

## 2022-07-25 VITALS — BP 110/60 | HR 77 | Ht 68.0 in | Wt 192.7 lb

## 2022-07-25 DIAGNOSIS — I739 Peripheral vascular disease, unspecified: Secondary | ICD-10-CM | POA: Diagnosis not present

## 2022-07-25 DIAGNOSIS — D649 Anemia, unspecified: Secondary | ICD-10-CM

## 2022-07-25 DIAGNOSIS — E785 Hyperlipidemia, unspecified: Secondary | ICD-10-CM

## 2022-07-25 DIAGNOSIS — E118 Type 2 diabetes mellitus with unspecified complications: Secondary | ICD-10-CM

## 2022-07-25 DIAGNOSIS — I2510 Atherosclerotic heart disease of native coronary artery without angina pectoris: Secondary | ICD-10-CM

## 2022-07-25 DIAGNOSIS — N179 Acute kidney failure, unspecified: Secondary | ICD-10-CM

## 2022-07-25 DIAGNOSIS — Z89512 Acquired absence of left leg below knee: Secondary | ICD-10-CM

## 2022-07-25 DIAGNOSIS — I5042 Chronic combined systolic (congestive) and diastolic (congestive) heart failure: Secondary | ICD-10-CM | POA: Diagnosis not present

## 2022-07-25 DIAGNOSIS — E039 Hypothyroidism, unspecified: Secondary | ICD-10-CM

## 2022-07-25 DIAGNOSIS — I10 Essential (primary) hypertension: Secondary | ICD-10-CM

## 2022-07-25 NOTE — Patient Instructions (Signed)
Medication Instructions:   Your physician recommends that you continue on your current medications as directed. Please refer to the Current Medication list given to you today.  *If you need a refill on your cardiac medications before your next appointment, please call your pharmacy*   Lab Work:  None Ordered  If you have labs (blood work) drawn today and your tests are completely normal, you will receive your results only by: [place] (if you have MyChart) OR A paper copy in the mail If you have any lab test that is abnormal or we need to change your treatment, we will call you to review the results.   Testing/Procedures:  None ordered    Follow-Up: At [facility], you and your health needs are our priority.  As part of our continuing mission to provide you with exceptional heart care, we have created designated Provider Care Teams.  These Care Teams include your primary Cardiologist (physician) and Advanced Practice Providers (APPs -  Physician Assistants and Nurse Practitioners) who all work together to provide you with the care you need, when you need it.  We recommend signing up for the patient portal called "MyChart".  Sign up information is provided on this After Visit Summary.  MyChart is used to connect with patients for Virtual Visits (Telemedicine).  Patients are able to view lab/test results, encounter notes, upcoming appointments, etc.  Non-urgent messages can be sent to your provider as well.   To learn more about what you can do with MyChart, go to [email or web address].    Your next appointment:    Mid May  Provider:   Nelva Bush, MD  ONLY

## 2022-07-31 ENCOUNTER — Other Ambulatory Visit: Payer: Self-pay | Admitting: Family Medicine

## 2022-08-03 ENCOUNTER — Encounter: Payer: Self-pay | Admitting: Cardiothoracic Surgery

## 2022-08-10 ENCOUNTER — Ambulatory Visit: Payer: No Typology Code available for payment source | Admitting: Cardiothoracic Surgery

## 2022-08-21 ENCOUNTER — Ambulatory Visit: Payer: No Typology Code available for payment source | Admitting: Neurology

## 2022-08-24 ENCOUNTER — Encounter (INDEPENDENT_AMBULATORY_CARE_PROVIDER_SITE_OTHER): Payer: Self-pay

## 2022-09-03 ENCOUNTER — Encounter: Payer: Self-pay | Admitting: Family Medicine

## 2022-09-03 DIAGNOSIS — E118 Type 2 diabetes mellitus with unspecified complications: Secondary | ICD-10-CM

## 2022-09-04 NOTE — Telephone Encounter (Signed)
I'm sorry, I misunderstood. I added lab orders for [place] to check kidneys, liver, thyroid, and pancreas.

## 2022-09-04 NOTE — Telephone Encounter (Signed)
These labs were ordered by the cardiac team. I cannot add orders to them. If Tamon wants these other tests, he will need to have them drawn separately

## 2022-09-05 ENCOUNTER — Encounter: Payer: No Typology Code available for payment source | Admitting: Physical Therapy

## 2022-09-06 ENCOUNTER — Other Ambulatory Visit (INDEPENDENT_AMBULATORY_CARE_PROVIDER_SITE_OTHER): Payer: No Typology Code available for payment source

## 2022-09-06 DIAGNOSIS — E118 Type 2 diabetes mellitus with unspecified complications: Secondary | ICD-10-CM | POA: Diagnosis not present

## 2022-09-06 DIAGNOSIS — D649 Anemia, unspecified: Secondary | ICD-10-CM

## 2022-09-06 LAB — TSH: TSH: 3.15 u[IU]/mL (ref 0.35–5.50)

## 2022-09-06 LAB — BASIC METABOLIC PANEL
BUN: 33 mg/dL — ABNORMAL HIGH (ref 6–23)
CO2: 26 mEq/L (ref 19–32)
Calcium: 9.8 mg/dL (ref 8.4–10.5)
Chloride: 104 mEq/L (ref 96–112)
Creatinine, Ser: 1.47 mg/dL (ref 0.40–1.50)
GFR: 50.02 mL/min — ABNORMAL LOW (ref >60.00)
Glucose, Bld: 125 mg/dL — ABNORMAL HIGH (ref 70–99)
Potassium: 4.6 mEq/L (ref 3.5–5.1)
Sodium: 139 mEq/L (ref 135–145)

## 2022-09-06 LAB — CBC WITH DIFFERENTIAL/PLATELET
Basophils Absolute: 0.1 10*3/uL (ref 0.0–0.1)
Basophils Relative: 1.2 % (ref 0.0–3.0)
Eosinophils Absolute: 0.3 10*3/uL (ref 0.0–0.7)
Eosinophils Relative: 4.4 % (ref 0.0–5.0)
HCT: 41.3 % (ref 39.0–52.0)
Hemoglobin: 13.9 g/dL (ref 13.0–17.0)
Lymphocytes Relative: 20.8 % (ref 12.0–46.0)
Lymphs Abs: 1.4 10*3/uL (ref 0.7–4.0)
MCHC: 33.7 g/dL (ref 30.0–36.0)
MCV: 94.6 fl (ref 78.0–100.0)
Monocytes Absolute: 0.4 10*3/uL (ref 0.1–1.0)
Monocytes Relative: 6.3 % (ref 3.0–12.0)
Neutro Abs: 4.5 10*3/uL (ref 1.4–7.7)
Neutrophils Relative %: 67.3 % (ref 43.0–77.0)
Platelets: 148 10*3/uL — ABNORMAL LOW (ref 150.0–400.0)
RBC: 4.37 Mil/uL (ref 4.22–5.81)
RDW: 14.3 % (ref 11.5–15.5)
WBC: 6.7 10*3/uL (ref 4.0–10.5)

## 2022-09-06 LAB — HEPATIC FUNCTION PANEL
ALT: 43 U/L (ref 0–53)
AST: 37 U/L (ref 0–37)
Albumin: 3.9 g/dL (ref 3.5–5.2)
Alkaline Phosphatase: 104 U/L (ref 39–117)
Bilirubin, Direct: 0.2 mg/dL (ref 0.0–0.3)
Total Bilirubin: 0.9 mg/dL (ref 0.2–1.2)
Total Protein: 7.7 g/dL (ref 6.0–8.3)

## 2022-09-06 LAB — LIPASE: Lipase: 21 U/L (ref 11.0–59.0)

## 2022-09-06 NOTE — Addendum Note (Signed)
Addended by: Rosalyn Gess D on: 09/06/2022 08:47 AM   Modules accepted: Orders

## 2022-09-12 ENCOUNTER — Ambulatory Visit: Payer: No Typology Code available for payment source | Admitting: Physical Therapy

## 2022-09-12 ENCOUNTER — Encounter: Payer: Self-pay | Admitting: Physical Therapy

## 2022-09-12 ENCOUNTER — Other Ambulatory Visit: Payer: Self-pay

## 2022-09-12 DIAGNOSIS — R2681 Unsteadiness on feet: Secondary | ICD-10-CM | POA: Diagnosis not present

## 2022-09-12 DIAGNOSIS — M6281 Muscle weakness (generalized): Secondary | ICD-10-CM | POA: Diagnosis not present

## 2022-09-12 DIAGNOSIS — L98498 Non-pressure chronic ulcer of skin of other sites with other specified severity: Secondary | ICD-10-CM

## 2022-09-12 DIAGNOSIS — R2689 Other abnormalities of gait and mobility: Secondary | ICD-10-CM | POA: Diagnosis not present

## 2022-09-12 DIAGNOSIS — M79662 Pain in left lower leg: Secondary | ICD-10-CM

## 2022-09-12 DIAGNOSIS — Z7409 Other reduced mobility: Secondary | ICD-10-CM

## 2022-09-12 NOTE — Therapy (Signed)
OUTPATIENT PHYSICAL THERAPY PROSTHETICS EVALUATION   Patient Name: [name] MRN: [id number] DOB:June 18, 1956, 67 y.o., male Today's Date: 09/12/2022  PCP: Laurey Morale, MD REFERRING PROVIDER: Eulogio Ditch, NP  END OF SESSION:  PT End of Session - 09/12/22 1655     Visit Number 1    Number of Visits 17    Date for PT Re-Evaluation 11/10/22    Authorization Type [employer]    Authorization Time Period 20% co-insurance, 89 PT visits remaining    Authorization - Visit Number 1    Authorization - Number of Visits 89    Progress Note Due on Visit 10    PT Start Time [id number]    PT Stop Time 1515    PT Time Calculation (min) 55 min    Equipment Utilized During Treatment Gait belt    Activity Tolerance Patient tolerated treatment well;Patient limited by pain;Patient limited by fatigue    Behavior During Therapy [facility] for tasks assessed/performed             Past Medical History:  Diagnosis Date   CAD (coronary artery disease)    a. 05/2022 Cath: LM nl, LAD 14m/37m, 70d, D3 90, LCX 3m, 60d, OM1 sev diff dzs, OM2 85p, OM2 subbranch sev diff dzs, RCA 70p/m, 84m.   CHF (congestive heart failure) (HCC)    Chronic HFrEF (heart failure with reduced ejection fraction) ([place])    a. 05/2022 Echo: EF 25-30%, mild LVH, GrII DD, mid-apical, anteroseptal, and apical HK. Nl RV fxn. RVSP 51.27mmHg. Mildly dil LA. Mod MR/TR. Mild AS/AI.   Diabetes mellitus    Hematospermia    Hypertension    Ischemic cardiomyopathy    a. 05/2022 Echo: EF 25-30%.   Moderate Mitral regurgitation    Moderate tricuspid regurgitation    Nephrolithiasis 02/07/2007   PAD (peripheral artery disease) ([place])    a. 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   Viral meningitis    Past Surgical History:  Procedure Laterality Date   AMPUTATION Left 05/19/2022   Procedure: AMPUTATION BELOW KNEE;  Surgeon: Katha Cabal, MD;  Location: [facility] ORS;  Service: Vascular;  Laterality: Left;   CHOLECYSTECTOMY  12-07-14   laparoscopic, per Dr. Rush Farmer     LOWER EXTREMITY ANGIOGRAPHY Left 05/16/2022   Procedure: Lower Extremity Angiography;  Surgeon: Katha Cabal, MD;  Location: [place] [facility];  Service: Cardiovascular;  Laterality: Left;   RIGHT/LEFT HEART CATH AND CORONARY ANGIOGRAPHY N/A 06/02/2022   Procedure: RIGHT/LEFT HEART CATH AND CORONARY ANGIOGRAPHY;  Surgeon: Nelva Bush, MD;  Location: [place] [facility];  Service: Cardiovascular;  Laterality: N/A;   Patient Active Problem List   Diagnosis Date Noted   Pneumonia of both upper lobes due to infectious organism 06/14/2022   Dilated cardiomyopathy ([place]) 06/04/2022   Coronary artery disease of native artery of native heart with stable angina pectoris ([place]) 06/03/2022   Ischemic cardiomyopathy 06/03/2022   PAD (peripheral artery disease) ([place]) 06/02/2022   Diabetes mellitus type 2 with complications ([place]) [id number]   Elevated troponin 06/02/2022   HFrEF (heart failure with reduced ejection fraction) ([place]) 05/31/2022   Demand ischemia 05/27/2022   Controlled type 2 diabetes mellitus with hyperglycemia, with [place]-term current use of insulin ([place]) 05/27/2022   Anemia 05/27/2022   Acute on chronic HFrEF (heart failure with reduced ejection fraction) ([place]) 05/27/2022   S/P BKA (below knee amputation) unilateral, left 05/19/2022 ([place]) 05/27/2022   Open displaced avulsion fracture of tuberosity of left calcaneus 05/23/2022   AMS (altered  mental status) 05/21/2022   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 due to underlying condition ([place]) 05/17/2022   Hypertensive urgency 05/16/2022   Gangrene of left foot ([place]) 05/16/2022   Pyogenic inflammation of bone ([place]) 05/16/2022   Diabetic retinopathy associated with type 2 diabetes mellitus ([place]) 01/02/2022   Hypothyroidism 08/23/2021   Type 2 diabetes mellitus with diabetic neuropathy, unspecified ([place]) 04/16/2020   Cataracts, bilateral 04/16/2020   Sinus tachycardia 01/14/2020   Insomnia 01/07/2020    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 2 diabetes mellitus ([place]) 07/27/2017   Generalized anxiety disorder 07/27/2017   Chronic right flank pain 12/20/2015   Hyperkalemia 10/21/2014   Chest pain 09/21/2014   ADHD 06/22/2010   ERECTILE DYSFUNCTION, ORGANIC 06/22/2010   MORBID OBESITY 08/04/2008   NEPHROLITHIASIS, HX OF 03/22/2007   Essential hypertension 02/22/2007    ONSET DATE: prosthesis delivery 08/29/2022  REFERRING DIAG: Z89.519 (ICD-10-CM) - Hx of BKA   THERAPY DIAG:  Other abnormalities of gait and mobility  Unsteadiness on feet  Muscle weakness (generalized)  Non-pressure chronic ulcer of skin of other sites with other specified severity (HCC)  Impaired functional mobility and activity tolerance  Pain in left lower leg  Rationale for Evaluation and Treatment: Rehabilitation  SUBJECTIVE:   SUBJECTIVE STATEMENT: Patient underwent left Transtibial Amputation on 12/1/202 due to 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 He was admitted to hospital 05/27/2022 - 06/04/2022 with cardiomyopathy / Acute on chronic HFrEF (heart failure with reduced ejection fraction. He needs cardiac surgery but trying to wait until he is mobile.  He tried to wear prosthesis 1 hour 2x/day for 3 days then got wound.  Pt accompanied by: significant other  PERTINENT HISTORY: HTN, IDDM2, diabetic neuropathy, PVD, hypothyroidism, viral meningitis  PAIN:  Are you having pain? No limb pain with standing see objective  PRECAUTIONS: Fall  WEIGHT BEARING RESTRICTIONS: No  FALLS: Has patient fallen in last 6 months? No  LIVING ENVIRONMENT: Lives with: lives with their spouse Lives in: [place] Access: [place] entrance primary or option of stairs Home layout: One level Stairs: Yes: External: 4-5 steps; bilateral but cannot reach both Has following equipment at home: Single point cane, Walker - 4 wheeled, Wheelchair (manual), Shower bench, bed side commode, Grab bars, and Ramped entry  PLOF:  Independent, Independent with household mobility with device, and Independent with community mobility with device    PATIENT GOALS:  to use prosthesis to be mobile to enable most appropriate cardiac surgery.   OBJECTIVE:  COGNITION: Overall cognitive status: Within functional limits for tasks assessed  POSTURE: rounded shoulders, forward head, flexed trunk , and weight shift right  LOWER EXTREMITY ROM:  ROM P:passive  A:active Right eval Left eval  Hip flexion    Hip extension    Hip abduction    Hip adduction    Hip internal rotation    Hip external rotation    Knee flexion    Knee extension    Ankle dorsiflexion    Ankle plantarflexion    Ankle inversion    Ankle eversion     (Blank rows = not tested)  LOWER EXTREMITY MMT:  MMT Right eval Left eval  Hip flexion    Hip extension    Hip abduction    Hip adduction    Hip internal rotation    Hip external rotation    Knee flexion    Knee extension    Ankle dorsiflexion    Ankle plantarflexion    Ankle  inversion    Ankle eversion    (Blank rows = not tested)  TRANSFERS: Sit to stand: SBA 22" w/c using armrests using back of legs against w/c to stabilize and touch on wall to left.   Stand to sit: SBA RW to 22" w/c using armrests   GAIT: Distance walked: 25' Assistive device utilized: Environmental consultant - 2 wheeled and left TTA prosthesis Level of assistance: Min A Gait pattern: step to pattern, decreased step length- Right, decreased stance time- Left, decreased hip/knee flexion- Left, Left hip hike, knee flexed in stance- Left, antalgic, lateral hip instability, trunk flexed, and abducted- Left Comments: excessive BUE weight bearing.   FUNCTIONAL TESTs:  Merrilee Jansky Balance Scale: 14/56  [facility] PT Assessment - 09/12/22 1430       Standardized Balance Assessment   Standardized Balance Assessment Berg Balance Test      Berg Balance Test   Sit to Stand Needs minimal aid to stand or to stabilize    Standing Unsupported Able  to stand 2 minutes with supervision    Sitting with Back Unsupported but Feet Supported on Floor or Stool Able to sit safely and securely 2 minutes    Stand to Sit Controls descent by using hands    Transfers Able to transfer safely, definite need of hands    Standing Unsupported with Eyes Closed Needs help to keep from falling    Standing Unsupported with Feet Together Needs help to attain position and unable to hold for 15 seconds    From Standing, Reach Forward with Outstretched Arm Loses balance while trying/requires external support    From Standing Position, Pick up Object from Floor Unable to try/needs assist to keep balance    From Standing Position, Turn to Look Behind Over each Shoulder Needs assist to keep from losing balance and falling    Turn 360 Degrees Needs assistance while turning    Standing Unsupported, Alternately Place Feet on Step/Stool Needs assistance to keep from falling or unable to try    Standing Unsupported, One Foot in [employer] balance while stepping or standing    Standing on One Leg Unable to try or needs assist to prevent fall    Total Score 14    Berg comment: BERG  < 36 high risk for falls (close to 100%) 46-51 moderate (>50%)   37-45 significant (>80%) 52-55 lower (> 25% )              CARDIOVASCULAR RESPONSE: Functional activity: gait & standing balance Pre-activity vitals: HR: 65 SpO2: 100% Post-activity vitals: HR: 77 SpO2: 96%  CURRENT PROSTHETIC WEAR ASSESSMENT: Evaluation / 09/12/2022:  Patient is dependent with: skin check, residual limb care, care of non-amputated limb, prosthetic cleaning, ply sock cleaning, correct ply sock adjustment, proper wear schedule/adjustment, and proper weight-bearing schedule/adjustment Donning prosthesis: Min A Doffing prosthesis: SBA Prosthetic wear tolerance: 1 hours, 2x/day, 3 days for first 3 of 7 days since delivery but stopped due to skin issue.  Prosthetic weight bearing tolerance: 5 minutes PWB  LLE with prosthesis limb pain 5/10 Edema: pitting Residual limb condition: water blisters 56mm x 3 mm distal limb,  pt & wife report incision at distal tibia cracked open with prosthesis wear for first 3 days.  No hair growth, shiny skin, pale color, normal temperature & moisture.  Cylindrical shape.  Prosthetic description: silicon liner with pin lock, secondary interface pelite liner, flexible keel foot.     TODAY'S TREATMENT:  DATE:  09/12/2022:   PATIENT EDUCATION: PATIENT EDUCATED ON FOLLOWING PROSTHETIC CARE: Education details:  Skin check, Residual limb care, Prosthetic cleaning, Correct ply sock adjustment, [place] donning, and Proper wear schedule/adjustment sit with prosthetic heel touching ground / not dangling and Prosthetic wear tolerance: 1 hours 2x/day, daily with plan to increase weekly with no skin issues.   Person educated: Patient and Spouse Education method: Explanation, Demonstration, Tactile cues, and Verbal cues Education comprehension: verbalized understanding, returned demonstration, verbal cues required, tactile cues required, and needs further education  HOME EXERCISE PROGRAM:  ASSESSMENT:  CLINICAL IMPRESSION: Patient is a 67 y.o. male who was seen today for physical therapy evaluation and treatment for prosthetic training with left Transtibial Amputation. This patient is dependent in prosthetic care limiting safe utilization of prosthesis. He has wound on limb.  Patient has limited wear limiting function during his day. Berg Balance score 14/56 indicates high fall risk & dependency in standing ADLs.  Patient is dependent in prosthetic gait with significant gait deviations.  He wants to get mobile on RW or rollator walker to enable cardiac procedure.  PT plans to initially get him to this level. Then discharge until he has cardiac procedure. Then  he will need to be referred back to PT to maximize his function.   OBJECTIVE IMPAIRMENTS: Abnormal gait, cardiopulmonary status limiting activity, decreased activity tolerance, decreased balance, decreased endurance, decreased knowledge of condition, decreased knowledge of use of DME, decreased mobility, difficulty walking, decreased strength, increased edema, prosthetic dependency , obesity, and pain.   ACTIVITY LIMITATIONS: carrying, lifting, bending, standing, stairs, transfers, and locomotion level  PARTICIPATION LIMITATIONS: cleaning, driving, and community activity  PERSONAL FACTORS: Fitness, Time since onset of injury/illness/exacerbation, and 3+ comorbidities: see PMH  are also affecting patient's functional outcome.   REHAB POTENTIAL: Good  CLINICAL DECISION MAKING: Evolving/moderate complexity  EVALUATION COMPLEXITY: Moderate   GOALS: Goals reviewed with patient? Yes  SHORT TERM GOALS: Target date: 10/13/2022  Patient donnes prosthesis modified independent & verbalizes proper cleaning. Baseline: SEE OBJECTIVE DATA Goal status: INITIAL 2.  Patient tolerates prosthesis >6 hrs total /day without skin issues or limb pain <3/10 after standing. Baseline: SEE OBJECTIVE DATA Goal status: INITIAL  3.  Patient able to reach 7" and look over both shoulders without UE support with supervision. Baseline: SEE OBJECTIVE DATA Goal status: INITIAL  4. Patient ambulates 39' with RW & prosthesis with supervision. Baseline: SEE OBJECTIVE DATA Goal status: INITIAL  5. Patient negotiates ramps & curbs with RW & prosthesis with minA. Baseline: SEE OBJECTIVE DATA Goal status: INITIAL  [place] TERM GOALS: Target date: 11/10/2022  Patient demonstrates & verbalized understanding of prosthetic care to enable safe utilization of prosthesis. Baseline: SEE OBJECTIVE DATA Goal status: INITIAL  Patient tolerates prosthesis wear >90% of awake hours without skin or limb pain issues. Baseline: SEE  OBJECTIVE DATA Goal status: INITIAL  Berg Balance score >/= 36/56 to indicate lower fall risk Baseline: SEE OBJECTIVE DATA Goal status: INITIAL  Patient ambulates >300' with RW / rollator walker & prosthesis modified independently Baseline: SEE OBJECTIVE DATA Goal status: INITIAL  Patient negotiates ramps, curbs with RW / rollator walker & prosthesis modified independently. Baseline: SEE OBJECTIVE DATA Goal status: INITIAL   PLAN:  PT FREQUENCY: 2x/week  PT DURATION: 8 weeks  PLANNED INTERVENTIONS: Therapeutic exercises, Therapeutic activity, Neuromuscular re-education, Balance training, Gait training, Patient/Family education, Self Care, Stair training, Vestibular training, Prosthetic training, DME instructions, and physical performance testing  PLAN FOR NEXT SESSION: review & update prosthetic  care including adjusting ply fit,  prosthetic gait with RW, assess with rollator.     Jamey Reas, PT, [name] 09/12/2022, 5:27 PM

## 2022-09-14 ENCOUNTER — Other Ambulatory Visit: Payer: No Typology Code available for payment source

## 2022-09-18 ENCOUNTER — Ambulatory Visit (INDEPENDENT_AMBULATORY_CARE_PROVIDER_SITE_OTHER): Payer: No Typology Code available for payment source | Admitting: Physical Therapy

## 2022-09-18 ENCOUNTER — Encounter: Payer: Self-pay | Admitting: Physical Therapy

## 2022-09-18 DIAGNOSIS — R2681 Unsteadiness on feet: Secondary | ICD-10-CM | POA: Diagnosis not present

## 2022-09-18 DIAGNOSIS — R2689 Other abnormalities of gait and mobility: Secondary | ICD-10-CM | POA: Diagnosis not present

## 2022-09-18 DIAGNOSIS — L98498 Non-pressure chronic ulcer of skin of other sites with other specified severity: Secondary | ICD-10-CM | POA: Diagnosis not present

## 2022-09-18 DIAGNOSIS — M6281 Muscle weakness (generalized): Secondary | ICD-10-CM

## 2022-09-18 DIAGNOSIS — R208 Other disturbances of skin sensation: Secondary | ICD-10-CM

## 2022-09-18 DIAGNOSIS — M79662 Pain in left lower leg: Secondary | ICD-10-CM

## 2022-09-18 DIAGNOSIS — Z7409 Other reduced mobility: Secondary | ICD-10-CM

## 2022-09-18 NOTE — Therapy (Signed)
OUTPATIENT PHYSICAL THERAPY PROSTHETIC TREATMENT   Patient Name: [name] MRN: [id number] DOB:[birth year], 67 y.o., male Today's Date: 09/18/2022  PCP: Laurey Morale, MD REFERRING PROVIDER: Eulogio Ditch, NP  END OF SESSION:  PT End of Session - 09/18/22 1146     Visit Number 2    Number of Visits 17    Date for PT Re-Evaluation 11/10/22    Authorization Type [employer]    Authorization Time Period 20% co-insurance, 89 PT visits remaining    Authorization - Visit Number 2    Authorization - Number of Visits 89    Progress Note Due on Visit 10    PT Start Time [id number]    PT Stop Time 1229    PT Time Calculation (min) 44 min    Equipment Utilized During Treatment Gait belt    Activity Tolerance Patient tolerated treatment well;Patient limited by pain;Patient limited by fatigue    Behavior During Therapy [facility] for tasks assessed/performed             Past Medical History:  Diagnosis Date   CAD (coronary artery disease)    a. 05/2022 Cath: LM nl, LAD 3m/82m, 70d, D3 90, LCX 14m, 60d, OM1 sev diff dzs, OM2 85p, OM2 subbranch sev diff dzs, RCA 70p/m, 3m.   CHF (congestive heart failure)    Chronic HFrEF (heart failure with reduced ejection fraction)    a. 05/2022 Echo: EF 25-30%, mild LVH, GrII DD, mid-apical, anteroseptal, and apical HK. Nl RV fxn. RVSP 51.63mmHg. Mildly dil LA. Mod MR/TR. Mild AS/AI.   Diabetes mellitus    Hematospermia    Hypertension    Ischemic cardiomyopathy    a. 05/2022 Echo: EF 25-30%.   Moderate Mitral regurgitation    Moderate tricuspid regurgitation    Nephrolithiasis 02/07/2007   PAD (peripheral artery disease)    a. 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   Viral meningitis    Past Surgical History:  Procedure Laterality Date   AMPUTATION Left 05/19/2022   Procedure: AMPUTATION BELOW KNEE;  Surgeon: Katha Cabal, MD;  Location: [facility];  Service: Vascular;  Laterality: Left;   CHOLECYSTECTOMY  12-07-14   laparoscopic, per Dr. Rush Farmer    LOWER EXTREMITY  ANGIOGRAPHY Left 05/16/2022   Procedure: Lower Extremity Angiography;  Surgeon: Katha Cabal, MD;  Location: [place] [facility];  Service: Cardiovascular;  Laterality: Left;   RIGHT/LEFT HEART CATH AND CORONARY ANGIOGRAPHY N/A 06/02/2022   Procedure: RIGHT/LEFT HEART CATH AND CORONARY ANGIOGRAPHY;  Surgeon: Nelva Bush, MD;  Location: [place] [facility];  Service: Cardiovascular;  Laterality: N/A;   Patient Active Problem List   Diagnosis Date Noted   Pneumonia of both upper lobes due to infectious organism 06/14/2022   Dilated cardiomyopathy 06/04/2022   Coronary artery disease of native artery of native heart with stable angina pectoris 06/03/2022   Ischemic cardiomyopathy 06/03/2022   PAD (peripheral artery disease) 06/02/2022   Diabetes mellitus type 2 with complications [id number]   Elevated troponin 06/02/2022   HFrEF (heart failure with reduced ejection fraction) 05/31/2022   Demand ischemia 05/27/2022   Controlled type 2 diabetes mellitus with hyperglycemia, with [place]-term current use of insulin 05/27/2022   Anemia 05/27/2022   Acute on chronic HFrEF (heart failure with reduced ejection fraction) 05/27/2022   S/P BKA (below knee amputation) unilateral, left 05/19/2022 ([place]) 05/27/2022   Open displaced avulsion fracture of tuberosity of left calcaneus 05/23/2022   AMS (altered mental status) 05/21/2022   Diabetic ulcer of left heel  associated with diabetes mellitus due to underlying condition 05/17/2022   Hypertensive urgency 05/16/2022   Gangrene of left foot 05/16/2022   Pyogenic inflammation of bone 05/16/2022   Diabetic retinopathy associated with type 2 diabetes mellitus 01/02/2022   Hypothyroidism 08/23/2021   Type 2 diabetes mellitus with diabetic neuropathy, unspecified 04/16/2020   Cataracts, bilateral 04/16/2020   Sinus tachycardia 01/14/2020   Insomnia 01/07/2020   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 2 diabetes mellitus 07/27/2017   Generalized anxiety disorder  07/27/2017   Chronic right flank pain 12/20/2015   Hyperkalemia 10/21/2014   Chest pain 09/21/2014   ADHD 06/22/2010   ERECTILE DYSFUNCTION, ORGANIC 06/22/2010   MORBID OBESITY 08/04/2008   NEPHROLITHIASIS, HX OF 03/22/2007   Essential hypertension 02/22/2007    ONSET DATE: prosthesis delivery 08/29/2022  REFERRING DIAG: Z89.519 (ICD-10-CM) - Hx of BKA   THERAPY DIAG:  Other abnormalities of gait and mobility  Unsteadiness on feet  Muscle weakness (generalized)  Non-pressure chronic ulcer of skin of other sites with other specified severity  Impaired functional mobility and activity tolerance  Pain in left lower leg  Other disturbances of skin sensation  Rationale for Evaluation and Treatment: Rehabilitation  SUBJECTIVE:   SUBJECTIVE STATEMENT: He wore prosthesis 1 hour 2x/day but still has issues getting the prosthesis on his limb.  The pin would not click in.  Pt accompanied by: significant other  PERTINENT HISTORY: HTN, IDDM2, diabetic neuropathy, PVD, hypothyroidism, viral meningitis  PAIN:  Are you having pain? No   PRECAUTIONS: Fall  WEIGHT BEARING RESTRICTIONS: No  FALLS: Has patient fallen in last 6 months? No  LIVING ENVIRONMENT: Lives with: lives with their spouse Lives in: [place] Access: [place] entrance primary or option of stairs Home layout: One level Stairs: Yes: External: 4-5 steps; bilateral but cannot reach both Has following equipment at home: Single point cane, Walker - 4 wheeled, Wheelchair (manual), Shower bench, bed side commode, Grab bars, and Ramped entry  PLOF: Independent, Independent with household mobility with device, and Independent with community mobility with device    PATIENT GOALS:  to use prosthesis to be mobile to enable most appropriate cardiac surgery.   OBJECTIVE:  COGNITION: Overall cognitive status: Within functional limits for tasks assessed  POSTURE: rounded shoulders, forward head, flexed trunk , and  weight shift right  LOWER EXTREMITY ROM:  ROM P:passive  A:active Right eval Left eval  Hip flexion    Hip extension    Hip abduction    Hip adduction    Hip internal rotation    Hip external rotation    Knee flexion    Knee extension    Ankle dorsiflexion    Ankle plantarflexion    Ankle inversion    Ankle eversion     (Blank rows = not tested)  LOWER EXTREMITY MMT:  MMT Right eval Left eval  Hip flexion    Hip extension    Hip abduction    Hip adduction    Hip internal rotation    Hip external rotation    Knee flexion    Knee extension    Ankle dorsiflexion    Ankle plantarflexion    Ankle inversion    Ankle eversion    (Blank rows = not tested)  TRANSFERS: Sit to stand: SBA 22" w/c using armrests using back of legs against w/c to stabilize and touch on wall to left.   Stand to sit: SBA RW to 22" w/c using armrests   GAIT: Distance walked: 25' Assistive  device utilized: Environmental consultant - 2 wheeled and left TTA prosthesis Level of assistance: Min A Gait pattern: step to pattern, decreased step length- Right, decreased stance time- Left, decreased hip/knee flexion- Left, Left hip hike, knee flexed in stance- Left, antalgic, lateral hip instability, trunk flexed, and abducted- Left Comments: excessive BUE weight bearing.   FUNCTIONAL TESTs:  Merrilee Jansky Balance Scale: 14/56   CARDIOVASCULAR RESPONSE: Functional activity: gait & standing balance Pre-activity vitals: HR: 65 SpO2: 100% Post-activity vitals: HR: 77 SpO2: 96%  CURRENT PROSTHETIC WEAR ASSESSMENT: Evaluation / 09/12/2022:  Patient is dependent with: skin check, residual limb care, care of non-amputated limb, prosthetic cleaning, ply sock cleaning, correct ply sock adjustment, proper wear schedule/adjustment, and proper weight-bearing schedule/adjustment Donning prosthesis: Min A Doffing prosthesis: SBA Prosthetic wear tolerance: 1 hours, 2x/day, 3 days for first 3 of 7 days since delivery but stopped due  to skin issue.  Prosthetic weight bearing tolerance: 5 minutes PWB LLE with prosthesis limb pain 5/10 Edema: pitting Residual limb condition: water blisters 78mm x 3 mm distal limb,  pt & wife report incision at distal tibia cracked open with prosthesis wear for first 3 days.  No hair growth, shiny skin, pale color, normal temperature & moisture.  Cylindrical shape.  Prosthetic description: silicon liner with pin lock, secondary interface pelite liner, flexible keel foot.     TODAY'S TREATMENT:                                                                                                                             DATE:  09/18/2022: Prosthetic Training with TTA prosthesis: Pt reviewed & instructed in donning prosthesis including pin on liner orientation.  Pt able to don without assistance with PT cues. He reports better understanding.  Blisters on distal limb are drying with no signs of infection or increase in size.  PT demo & verbal cues on sit to /from stand technique.  5 reps from 22" w/c using armrests with RW in front with goal to not touch for stabilization unless necessary for balance. He only had to touch RW 1 of 5 reps. Progressed to 18" chair pushing off seat 3 reps.  PT demo & verbal cues on safe rollator walker use including sit /stand from seat. Pt amb 40' with rollator walker with supervision.  He was able to sit /stand from seat with supervision. Pt amb 3' backwards with locked rollator with PT cues.    09/12/2022:   PATIENT EDUCATION: PATIENT EDUCATED ON FOLLOWING PROSTHETIC CARE: Education details:  Skin check, Residual limb care, Prosthetic cleaning, Correct ply sock adjustment, [place] donning, and Proper wear schedule/adjustment sit with prosthetic heel touching ground / not dangling and Prosthetic wear tolerance: 1 hours 2x/day, daily with plan to increase weekly with no skin issues.   Person educated: Patient and Spouse Education method: Explanation, Demonstration,  Tactile cues, and Verbal cues Education comprehension: verbalized understanding, returned demonstration, verbal cues required, tactile cues required, and needs further education  HOME EXERCISE PROGRAM:  ASSESSMENT:  CLINICAL IMPRESSION: Patient improved his ability to don prosthesis with PT instruction. He improved sit to /from stand technique.  Pt appears to have general understanding of gait with rollator.   OBJECTIVE IMPAIRMENTS: Abnormal gait, cardiopulmonary status limiting activity, decreased activity tolerance, decreased balance, decreased endurance, decreased knowledge of condition, decreased knowledge of use of DME, decreased mobility, difficulty walking, decreased strength, increased edema, prosthetic dependency , obesity, and pain.   ACTIVITY LIMITATIONS: carrying, lifting, bending, standing, stairs, transfers, and locomotion level  PARTICIPATION LIMITATIONS: cleaning, driving, and community activity  PERSONAL FACTORS: Fitness, Time since onset of injury/illness/exacerbation, and 3+ comorbidities: see PMH  are also affecting patient's functional outcome.   REHAB POTENTIAL: Good  CLINICAL DECISION MAKING: Evolving/moderate complexity  EVALUATION COMPLEXITY: Moderate   GOALS: Goals reviewed with patient? Yes  SHORT TERM GOALS: Target date: 10/13/2022  Patient donnes prosthesis modified independent & verbalizes proper cleaning. Baseline: SEE OBJECTIVE DATA Goal status: INITIAL 2.  Patient tolerates prosthesis >6 hrs total /day without skin issues or limb pain <3/10 after standing. Baseline: SEE OBJECTIVE DATA Goal status: INITIAL  3.  Patient able to reach 7" and look over both shoulders without UE support with supervision. Baseline: SEE OBJECTIVE DATA Goal status: INITIAL  4. Patient ambulates 73' with RW & prosthesis with supervision. Baseline: SEE OBJECTIVE DATA Goal status: INITIAL  5. Patient negotiates ramps & curbs with RW & prosthesis with minA. Baseline:  SEE OBJECTIVE DATA Goal status: INITIAL  [place] TERM GOALS: Target date: 11/10/2022  Patient demonstrates & verbalized understanding of prosthetic care to enable safe utilization of prosthesis. Baseline: SEE OBJECTIVE DATA Goal status: INITIAL  Patient tolerates prosthesis wear >90% of awake hours without skin or limb pain issues. Baseline: SEE OBJECTIVE DATA Goal status: INITIAL  Berg Balance score >/= 36/56 to indicate lower fall risk Baseline: SEE OBJECTIVE DATA Goal status: INITIAL  Patient ambulates >300' with RW / rollator walker & prosthesis modified independently Baseline: SEE OBJECTIVE DATA Goal status: INITIAL  Patient negotiates ramps, curbs with RW / rollator walker & prosthesis modified independently. Baseline: SEE OBJECTIVE DATA Goal status: INITIAL   PLAN:  PT FREQUENCY: 2x/week  PT DURATION: 8 weeks  PLANNED INTERVENTIONS: Therapeutic exercises, Therapeutic activity, Neuromuscular re-education, Balance training, Gait training, Patient/Family education, Self Care, Stair training, Vestibular training, Prosthetic training, DME instructions, and physical performance testing  PLAN FOR NEXT SESSION: check on donning, instruct in adjusting ply fit,  prosthetic gait with Rollator Walker instruct in ramps & curbs   Jamey Reas, PT, [name] 09/18/2022, 1:11 PM

## 2022-09-20 ENCOUNTER — Encounter: Payer: Self-pay | Admitting: Physical Therapy

## 2022-09-20 ENCOUNTER — Ambulatory Visit (INDEPENDENT_AMBULATORY_CARE_PROVIDER_SITE_OTHER): Payer: No Typology Code available for payment source | Admitting: Physical Therapy

## 2022-09-20 DIAGNOSIS — L98498 Non-pressure chronic ulcer of skin of other sites with other specified severity: Secondary | ICD-10-CM | POA: Diagnosis not present

## 2022-09-20 DIAGNOSIS — M79662 Pain in left lower leg: Secondary | ICD-10-CM

## 2022-09-20 DIAGNOSIS — R2681 Unsteadiness on feet: Secondary | ICD-10-CM | POA: Diagnosis not present

## 2022-09-20 DIAGNOSIS — R2689 Other abnormalities of gait and mobility: Secondary | ICD-10-CM

## 2022-09-20 DIAGNOSIS — M6281 Muscle weakness (generalized): Secondary | ICD-10-CM

## 2022-09-20 DIAGNOSIS — Z7409 Other reduced mobility: Secondary | ICD-10-CM

## 2022-09-20 NOTE — Therapy (Signed)
OUTPATIENT PHYSICAL THERAPY PROSTHETIC TREATMENT   Patient Name: [name] MRN: [id number] DOB:10/03/1955, 67 y.o., male Today's Date: 09/20/2022  PCP: Laurey Morale, MD REFERRING PROVIDER: Eulogio Ditch, NP  END OF SESSION:  PT End of Session - 09/20/22 1556     Visit Number 3    Number of Visits 17    Date for PT Re-Evaluation 11/10/22    Authorization Type [employer]    Authorization Time Period 20% co-insurance, 89 PT visits remaining    Authorization - Number of Visits 89    Progress Note Due on Visit 10    PT Start Time [id number]    PT Stop Time 1658    PT Time Calculation (min) 60 min    Equipment Utilized During Treatment Gait belt    Activity Tolerance Patient tolerated treatment well;Patient limited by pain;Patient limited by fatigue    Behavior During Therapy [facility] for tasks assessed/performed             Past Medical History:  Diagnosis Date   CAD (coronary artery disease)    a. 05/2022 Cath: LM nl, LAD 75m/56m, 70d, D3 90, LCX 3m, 60d, OM1 sev diff dzs, OM2 85p, OM2 subbranch sev diff dzs, RCA 70p/m, 31m.   CHF (congestive heart failure)    Chronic HFrEF (heart failure with reduced ejection fraction)    a. 05/2022 Echo: EF 25-30%, mild LVH, GrII DD, mid-apical, anteroseptal, and apical HK. Nl RV fxn. RVSP 51.2mmHg. Mildly dil LA. Mod MR/TR. Mild AS/AI.   Diabetes mellitus    Hematospermia    Hypertension    Ischemic cardiomyopathy    a. 05/2022 Echo: EF 25-30%.   Moderate Mitral regurgitation    Moderate tricuspid regurgitation    Nephrolithiasis 02/07/2007   PAD (peripheral artery disease)    a. 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   Viral meningitis    Past Surgical History:  Procedure Laterality Date   AMPUTATION Left 05/19/2022   Procedure: AMPUTATION BELOW KNEE;  Surgeon: Katha Cabal, MD;  Location: [facility];  Service: Vascular;  Laterality: Left;   CHOLECYSTECTOMY  12-07-14   laparoscopic, per Dr. Rush Farmer    LOWER EXTREMITY ANGIOGRAPHY Left 05/16/2022    Procedure: Lower Extremity Angiography;  Surgeon: Katha Cabal, MD;  Location: [place];  Service: Cardiovascular;  Laterality: Left;   RIGHT/LEFT HEART CATH AND CORONARY ANGIOGRAPHY N/A 06/02/2022   Procedure: RIGHT/LEFT HEART CATH AND CORONARY ANGIOGRAPHY;  Surgeon: Nelva Bush, MD;  Location: [facility];  Service: Cardiovascular;  Laterality: N/A;   Patient Active Problem List   Diagnosis Date Noted   Pneumonia of both upper lobes due to infectious organism 06/14/2022   Dilated cardiomyopathy 06/04/2022   Coronary artery disease of native artery of native heart with stable angina pectoris 06/03/2022   Ischemic cardiomyopathy 06/03/2022   PAD (peripheral artery disease) 06/02/2022   Diabetes mellitus type 2 with complications [id number]   Elevated troponin 06/02/2022   HFrEF (heart failure with reduced ejection fraction) 05/31/2022   Demand ischemia 05/27/2022   Controlled type 2 diabetes mellitus with hyperglycemia, with [place]-term current use of insulin 05/27/2022   Anemia 05/27/2022   Acute on chronic HFrEF (heart failure with reduced ejection fraction) 05/27/2022   S/P BKA (below knee amputation) unilateral, left 05/19/2022 ([place]) 05/27/2022   Open displaced avulsion fracture of tuberosity of left calcaneus 05/23/2022   AMS (altered mental status) 05/21/2022   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 due to underlying condition  05/17/2022   Hypertensive urgency 05/16/2022   Gangrene of left foot 05/16/2022   Pyogenic inflammation of bone 05/16/2022   Diabetic retinopathy associated with type 2 diabetes mellitus 01/02/2022   Hypothyroidism 08/23/2021   Type 2 diabetes mellitus with diabetic neuropathy, unspecified 04/16/2020   Cataracts, bilateral 04/16/2020   Sinus tachycardia 01/14/2020   Insomnia 01/07/2020   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 2 diabetes mellitus 07/27/2017   Generalized anxiety disorder 07/27/2017   Chronic right flank  pain 12/20/2015   Hyperkalemia 10/21/2014   Chest pain 09/21/2014   ADHD 06/22/2010   ERECTILE DYSFUNCTION, ORGANIC 06/22/2010   MORBID OBESITY 08/04/2008   NEPHROLITHIASIS, HX OF 03/22/2007   Essential hypertension 02/22/2007    ONSET DATE: prosthesis delivery 08/29/2022  REFERRING DIAG: Z89.519 (ICD-10-CM) - Hx of BKA   THERAPY DIAG:  Other abnormalities of gait and mobility  Unsteadiness on feet  Muscle weakness (generalized)  Non-pressure chronic ulcer of skin of other sites with other specified severity  Impaired functional mobility and activity tolerance  Pain in left lower leg  Rationale for Evaluation and Treatment: Rehabilitation  SUBJECTIVE:   SUBJECTIVE STATEMENT: He wore prosthesis 1 hour 3x/day. He has been standing up 5x without touching RW.   Pt accompanied by: significant other  PERTINENT HISTORY: HTN, IDDM2, diabetic neuropathy, PVD, hypothyroidism, viral meningitis  PAIN:  Are you having pain? No   PRECAUTIONS: Fall  WEIGHT BEARING RESTRICTIONS: No  FALLS: Has patient fallen in last 6 months? No  LIVING ENVIRONMENT: Lives with: lives with their spouse Lives in: [place] Access: [place] entrance primary or option of stairs Home layout: One level Stairs: Yes: External: 4-5 steps; bilateral but cannot reach both Has following equipment at home: Single point cane, Walker - 4 wheeled, Wheelchair (manual), Shower bench, bed side commode, Grab bars, and Ramped entry  PLOF: Independent, Independent with household mobility with device, and Independent with community mobility with device    PATIENT GOALS:  to use prosthesis to be mobile to enable most appropriate cardiac surgery.   OBJECTIVE:  COGNITION: Overall cognitive status: Within functional limits for tasks assessed  POSTURE: rounded shoulders, forward head, flexed trunk , and weight shift right  LOWER EXTREMITY ROM:  ROM P:passive  A:active Right eval Left eval  Hip flexion     Hip extension    Hip abduction    Hip adduction    Hip internal rotation    Hip external rotation    Knee flexion    Knee extension    Ankle dorsiflexion    Ankle plantarflexion    Ankle inversion    Ankle eversion     (Blank rows = not tested)  LOWER EXTREMITY MMT:  MMT Right eval Left eval  Hip flexion    Hip extension    Hip abduction    Hip adduction    Hip internal rotation    Hip external rotation    Knee flexion    Knee extension    Ankle dorsiflexion    Ankle plantarflexion    Ankle inversion    Ankle eversion    (Blank rows = not tested)  TRANSFERS: Sit to stand: SBA 22" w/c using armrests using back of legs against w/c to stabilize and touch on wall to left.   Stand to sit: SBA RW to 22" w/c using armrests   GAIT: Distance walked: 25' Assistive device utilized: Environmental consultant - 2 wheeled and left TTA prosthesis Level of assistance: Min A Gait pattern: step to pattern, decreased  step length- Right, decreased stance time- Left, decreased hip/knee flexion- Left, Left hip hike, knee flexed in stance- Left, antalgic, lateral hip instability, trunk flexed, and abducted- Left Comments: excessive BUE weight bearing.   FUNCTIONAL TESTs:  Merrilee Jansky Balance Scale: 14/56   CARDIOVASCULAR RESPONSE: Functional activity: gait & standing balance Pre-activity vitals: HR: 65 SpO2: 100% Post-activity vitals: HR: 77 SpO2: 96%  CURRENT PROSTHETIC WEAR ASSESSMENT: Evaluation / 09/12/2022:  Patient is dependent with: skin check, residual limb care, care of non-amputated limb, prosthetic cleaning, ply sock cleaning, correct ply sock adjustment, proper wear schedule/adjustment, and proper weight-bearing schedule/adjustment Donning prosthesis: Min A Doffing prosthesis: SBA Prosthetic wear tolerance: 1 hours, 2x/day, 3 days for first 3 of 7 days since delivery but stopped due to skin issue.  Prosthetic weight bearing tolerance: 5 minutes PWB LLE with prosthesis limb pain 5/10 Edema:  pitting Residual limb condition: water blisters 8mm x 3 mm distal limb,  pt & wife report incision at distal tibia cracked open with prosthesis wear for first 3 days.  No hair growth, shiny skin, pale color, normal temperature & moisture.  Cylindrical shape.  Prosthetic description: silicon liner with pin lock, secondary interface pelite liner, flexible keel foot.     TODAY'S TREATMENT:                                                                                                                             DATE:  09/20/2022: Prosthetic Training with TTA prosthesis: 3 areas at distal limb where he had blisters appears to have healed except epidural layer of skin.  No signs of infection.  Patient recommending to increase wear to 2 hours 3 times a day with 2 or more greater hour break between wear.  Patient verbalized understanding. PT instructed patient and wife in adjusting ply socks with limb volume changes.  Patient donned and ambulated with too few, too many and correct ply fit.  PT also recommended that when he lays the house he should take 2 #1 ply and 1 #3 ply socks to adjust as needed.  PT instructed that he may need to start his days with greater socks over time as his legs shrinks and during the day as weightbearing pushes fluid out of his residual limb.  Patient and wife verbalized general understanding of how to adjust ply socks. Patient's wife reports she ordered a standard folding rolling walker.  PT demonstrated how to load and unload the walker if patient is trying to drive.  PT recommended that patient return to driving and low traffic situations initially.  His below-knee amputations on the left side so should not affect his driving have an automatic car.  Patient and wife verbalized understanding. PT demo and verbal cues on negotiating curb and ramp.  Patient negotiated with standard rolling walker and with rollator walker with supervision. PT demo & verbal cues on sit to / from stand  from a 18" chair without armrest using UEs on seat.  Patient performed with supervision.  09/18/2022: Prosthetic Training with TTA prosthesis: Pt reviewed & instructed in donning prosthesis including pin on liner orientation.  Pt able to don without assistance with PT cues. He reports better understanding.  Blisters on distal limb are drying with no signs of infection or increase in size.  PT demo & verbal cues on sit to /from stand technique.  5 reps from 22" w/c using armrests with RW in front with goal to not touch for stabilization unless necessary for balance. He only had to touch RW 1 of 5 reps. Progressed to 18" chair pushing off seat 3 reps.  PT demo & verbal cues on safe rollator walker use including sit /stand from seat. Pt amb 40' with rollator walker with supervision.  He was able to sit /stand from seat with supervision. Pt amb 3' backwards with locked rollator with PT cues.    09/12/2022:   PATIENT EDUCATION: PATIENT EDUCATED ON FOLLOWING PROSTHETIC CARE: Education details:  Skin check, Residual limb care, Prosthetic cleaning, Correct ply sock adjustment, [place]pper donning, and Proper wear schedule/adjustment sit with prosthetic heel touching ground / not dangling and Prosthetic wear tolerance: 1 hours 2x/day, daily with plan to increase weekly with no skin issues.   Person educated: Patient and Spouse Education method: Explanation, Demonstration, Tactile cues, and Verbal cues Education comprehension: verbalized understanding, returned demonstration, verbal cues required, tactile cues required, and needs further education  HOME EXERCISE PROGRAM:  ASSESSMENT:  CLINICAL IMPRESSION: Patient appears to have a general understanding how to adjust ply socks with limb volume changes.  Patient appears to understand how to negotiate ramps and curbs with standard walker and with rollator walker.    OBJECTIVE IMPAIRMENTS: Abnormal gait, cardiopulmonary status limiting activity, decreased  activity tolerance, decreased balance, decreased endurance, decreased knowledge of condition, decreased knowledge of use of DME, decreased mobility, difficulty walking, decreased strength, increased edema, prosthetic dependency , obesity, and pain.   ACTIVITY LIMITATIONS: carrying, lifting, bending, standing, stairs, transfers, and locomotion level  PARTICIPATION LIMITATIONS: cleaning, driving, and community activity  PERSONAL FACTORS: Fitness, Time since onset of injury/illness/exacerbation, and 3+ comorbidities: see PMH  are also affecting patient's functional outcome.   REHAB POTENTIAL: Good  CLINICAL DECISION MAKING: Evolving/moderate complexity  EVALUATION COMPLEXITY: Moderate   GOALS: Goals reviewed with patient? Yes  SHORT TERM GOALS: Target date: 10/13/2022  Patient donnes prosthesis modified independent & verbalizes proper cleaning. Baseline: SEE OBJECTIVE DATA Goal status: ongoing 09/20/2022 2.  Patient tolerates prosthesis >6 hrs total /day without skin issues or limb pain <3/10 after standing. Baseline: SEE OBJECTIVE DATA Goal status: ongoing 09/20/2022  3.  Patient able to reach 7" and look over both shoulders without UE support with supervision. Baseline: SEE OBJECTIVE DATA Goal status: ongoing 09/20/2022  4. Patient ambulates 42' with RW & prosthesis with supervision. Baseline: SEE OBJECTIVE DATA Goal status: ongoing 09/20/2022  5. Patient negotiates ramps & curbs with RW & prosthesis with minA. Baseline: SEE OBJECTIVE DATA Goal status: ongoing 09/20/2022  [place] TERM GOALS: Target date: 11/10/2022  Patient demonstrates & verbalized understanding of prosthetic care to enable safe utilization of prosthesis. Baseline: SEE OBJECTIVE DATA Goal status: ongoing 09/20/2022  Patient tolerates prosthesis wear >90% of awake hours without skin or limb pain issues. Baseline: SEE OBJECTIVE DATA Goal status: ongoing 09/20/2022  Berg Balance score >/= 36/56 to indicate lower fall  risk Baseline: SEE OBJECTIVE DATA Goal status: ongoing 09/20/2022  Patient ambulates >300' with RW / rollator walker & prosthesis modified independently Baseline: SEE  OBJECTIVE DATA Goal status: ongoing 09/20/2022  Patient negotiates ramps, curbs with RW / rollator walker & prosthesis modified independently. Baseline: SEE OBJECTIVE DATA Goal status: ongoing 09/20/2022   PLAN:  PT FREQUENCY: 2x/week  PT DURATION: 8 weeks  PLANNED INTERVENTIONS: Therapeutic exercises, Therapeutic activity, Neuromuscular re-education, Balance training, Gait training, Patient/Family education, Self Care, Stair training, Vestibular training, Prosthetic training, DME instructions, and physical performance testing  PLAN FOR NEXT SESSION:  check residual limb and instruct in any prosthetic care issues that arise, instruct in HEP for balance.  Check on prosthetic gait with walker including ramps & curbs.   Jamey Reas, PT, [name] 09/20/2022, 5:25 PM

## 2022-09-25 ENCOUNTER — Ambulatory Visit (INDEPENDENT_AMBULATORY_CARE_PROVIDER_SITE_OTHER): Payer: No Typology Code available for payment source | Admitting: Physical Therapy

## 2022-09-25 ENCOUNTER — Encounter: Payer: Self-pay | Admitting: Physical Therapy

## 2022-09-25 DIAGNOSIS — M79662 Pain in left lower leg: Secondary | ICD-10-CM

## 2022-09-25 DIAGNOSIS — L98498 Non-pressure chronic ulcer of skin of other sites with other specified severity: Secondary | ICD-10-CM

## 2022-09-25 DIAGNOSIS — M6281 Muscle weakness (generalized): Secondary | ICD-10-CM | POA: Diagnosis not present

## 2022-09-25 DIAGNOSIS — R2681 Unsteadiness on feet: Secondary | ICD-10-CM | POA: Diagnosis not present

## 2022-09-25 DIAGNOSIS — R2689 Other abnormalities of gait and mobility: Secondary | ICD-10-CM

## 2022-09-25 DIAGNOSIS — Z7409 Other reduced mobility: Secondary | ICD-10-CM

## 2022-09-25 NOTE — Therapy (Signed)
OUTPATIENT PHYSICAL THERAPY PROSTHETIC TREATMENT   Patient Name: [name] MRN: [id number] DOB:[birth year], 67 y.o., male Today's Date: 09/25/2022  PCP: Nelwyn Salisbury, MD REFERRING PROVIDER: Sheppard Plumber, NP  END OF SESSION:  PT End of Session - 09/25/22 1626     Visit Number 4    Number of Visits 17    Date for PT Re-Evaluation 11/10/22    Authorization Type [employer]    Authorization Time Period 20% co-insurance, 89 PT visits remaining    Authorization - Visit Number 3    Authorization - Number of Visits 89    Progress Note Due on Visit 10    PT Start Time 1515    PT Stop Time 1555    PT Time Calculation (min) 40 min    Equipment Utilized During Treatment Gait belt    Activity Tolerance Patient tolerated treatment well;Patient limited by pain;Patient limited by fatigue    Behavior During Therapy [facility] for tasks assessed/performed              Past Medical History:  Diagnosis Date   CAD (coronary artery disease)    a. 05/2022 Cath: LM nl, LAD 71m/10m, 70d, D3 90, LCX 74m, 60d, OM1 sev diff dzs, OM2 85p, OM2 subbranch sev diff dzs, RCA 70p/m, 82m.   CHF (congestive heart failure)    Chronic HFrEF (heart failure with reduced ejection fraction)    a. 05/2022 Echo: EF 25-30%, mild LVH, GrII DD, mid-apical, anteroseptal, and apical HK. Nl RV fxn. RVSP 51.64mmHg. Mildly dil LA. Mod MR/TR. Mild AS/AI.   Diabetes mellitus    Hematospermia    Hypertension    Ischemic cardiomyopathy    a. 05/2022 Echo: EF 25-30%.   Moderate Mitral regurgitation    Moderate tricuspid regurgitation    Nephrolithiasis 02/07/2007   PAD (peripheral artery disease)    a. 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   Viral meningitis    Past Surgical History:  Procedure Laterality Date   AMPUTATION Left 05/19/2022   Procedure: AMPUTATION BELOW KNEE;  Surgeon: Renford Dills, MD;  Location: [facility] ORS;  Service: Vascular;  Laterality: Left;   CHOLECYSTECTOMY  12-07-14   laparoscopic, per Dr. Rayburn Ma    LOWER EXTREMITY  ANGIOGRAPHY Left 05/16/2022   Procedure: Lower Extremity Angiography;  Surgeon: Renford Dills, MD;  Location: [facility] INVASIVE CV LAB;  Service: Cardiovascular;  Laterality: Left;   RIGHT/LEFT HEART CATH AND CORONARY ANGIOGRAPHY N/A 06/02/2022   Procedure: RIGHT/LEFT HEART CATH AND CORONARY ANGIOGRAPHY;  Surgeon: Yvonne Kendall, MD;  Location: [facility] INVASIVE CV LAB;  Service: Cardiovascular;  Laterality: N/A;   Patient Active Problem List   Diagnosis Date Noted   Pneumonia of both upper lobes due to infectious organism 06/14/2022   Dilated cardiomyopathy 06/04/2022   Coronary artery disease of native artery of native heart with stable angina pectoris 06/03/2022   Ischemic cardiomyopathy 06/03/2022   PAD (peripheral artery disease) 06/02/2022   Diabetes mellitus type 2 with complications 06/02/2022   Elevated troponin 06/02/2022   HFrEF (heart failure with reduced ejection fraction) 05/31/2022   Demand ischemia 05/27/2022   Controlled type 2 diabetes mellitus with hyperglycemia, with Accardo-term current use of insulin 05/27/2022   Anemia 05/27/2022   Acute on chronic HFrEF (heart failure with reduced ejection fraction) 05/27/2022   S/P BKA (below knee amputation) unilateral, left 05/19/2022 (HCC) 05/27/2022   Open displaced avulsion fracture of tuberosity of left calcaneus 05/23/2022   AMS (altered mental status) 05/21/2022   Diabetic ulcer of left  heel associated with diabetes mellitus due to underlying condition 05/17/2022   Hypertensive urgency 05/16/2022   Gangrene of left foot 05/16/2022   Pyogenic inflammation of bone 05/16/2022   Diabetic retinopathy associated with type 2 diabetes mellitus 01/02/2022   Hypothyroidism 08/23/2021   Type 2 diabetes mellitus with diabetic neuropathy, unspecified 04/16/2020   Cataracts, bilateral 04/16/2020   Sinus tachycardia 01/14/2020   Insomnia 01/07/2020   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 2 diabetes mellitus 07/27/2017   Generalized anxiety disorder  07/27/2017   Chronic right flank pain 12/20/2015   Hyperkalemia 10/21/2014   Chest pain 09/21/2014   ADHD 06/22/2010   ERECTILE DYSFUNCTION, ORGANIC 06/22/2010   MORBID OBESITY 08/04/2008   NEPHROLITHIASIS, HX OF 03/22/2007   Essential hypertension 02/22/2007    ONSET DATE: prosthesis delivery 08/29/2022  REFERRING DIAG: Z89.519 (ICD-10-CM) - Hx of BKA   THERAPY DIAG:  Other abnormalities of gait and mobility  Unsteadiness on feet  Muscle weakness (generalized)  Non-pressure chronic ulcer of skin of other sites with other specified severity  Pain in left lower leg  Impaired functional mobility and activity tolerance  Rationale for Evaluation and Treatment: Rehabilitation  SUBJECTIVE:   SUBJECTIVE STATEMENT: He relays he drove his self today, he had his wife accompany him but he may try bringing himself the next time. Denies any increased pain today but does still have a hard time adjusting socks.  Pt accompanied by: significant other  PERTINENT HISTORY: HTN, IDDM2, diabetic neuropathy, PVD, hypothyroidism, viral meningitis  PAIN:  Are you having pain? No   PRECAUTIONS: Fall  WEIGHT BEARING RESTRICTIONS: No  FALLS: Has patient fallen in last 6 months? No  LIVING ENVIRONMENT: Lives with: lives with their spouse Lives in: House  Home Access: Ramped entrance primary or option of stairs Home layout: One level Stairs: Yes: External: 4-5 steps; bilateral but cannot reach both Has following equipment at home: Single point cane, Walker - 4 wheeled, Wheelchair (manual), Shower bench, bed side commode, Grab bars, and Ramped entry  PLOF: Independent, Independent with household mobility with device, and Independent with community mobility with device    PATIENT GOALS:  to use prosthesis to be mobile to enable most appropriate cardiac surgery.   OBJECTIVE:  COGNITION: Overall cognitive status: Within functional limits for tasks assessed  POSTURE: rounded shoulders,  forward head, flexed trunk , and weight shift right  LOWER EXTREMITY ROM:  ROM P:passive  A:active Right eval Left eval  Hip flexion    Hip extension    Hip abduction    Hip adduction    Hip internal rotation    Hip external rotation    Knee flexion    Knee extension    Ankle dorsiflexion    Ankle plantarflexion    Ankle inversion    Ankle eversion     (Blank rows = not tested)  LOWER EXTREMITY MMT:  MMT Right eval Left eval  Hip flexion    Hip extension    Hip abduction    Hip adduction    Hip internal rotation    Hip external rotation    Knee flexion    Knee extension    Ankle dorsiflexion    Ankle plantarflexion    Ankle inversion    Ankle eversion    (Blank rows = not tested)  TRANSFERS: Sit to stand: SBA 22" w/c using armrests using back of legs against w/c to stabilize and touch on wall to left.   Stand to sit: SBA RW to 22" w/c using  armrests   GAIT: Distance walked: 25' Assistive device utilized: Environmental consultant - 2 wheeled and left TTA prosthesis Level of assistance: Min A Gait pattern: step to pattern, decreased step length- Right, decreased stance time- Left, decreased hip/knee flexion- Left, Left hip hike, knee flexed in stance- Left, antalgic, lateral hip instability, trunk flexed, and abducted- Left Comments: excessive BUE weight bearing.   FUNCTIONAL TESTs:  Sharlene Motts Balance Scale: 14/56   CARDIOVASCULAR RESPONSE: Functional activity: gait & standing balance Pre-activity vitals: HR: 65 SpO2: 100% Post-activity vitals: HR: 77 SpO2: 96%  CURRENT PROSTHETIC WEAR ASSESSMENT: Evaluation / 09/12/2022:  Patient is dependent with: skin check, residual limb care, care of non-amputated limb, prosthetic cleaning, ply sock cleaning, correct ply sock adjustment, proper wear schedule/adjustment, and proper weight-bearing schedule/adjustment Donning prosthesis: Min A Doffing prosthesis: SBA Prosthetic wear tolerance: 1 hours, 2x/day, 3 days for first 3 of 7  days since delivery but stopped due to skin issue.  Prosthetic weight bearing tolerance: 5 minutes PWB LLE with prosthesis limb pain 5/10 Edema: pitting Residual limb condition: water blisters 17mm x 3 mm distal limb,  pt & wife report incision at distal tibia cracked open with prosthesis wear for first 3 days.  No hair growth, shiny skin, pale color, normal temperature & moisture.  Cylindrical shape.  Prosthetic description: silicon liner with pin lock, secondary interface pelite liner, flexible keel foot.     TODAY'S TREATMENT:                                                                                                                             DATE:  09/25/2022: Prosthetic Training with TTA prosthesis: Reviewed adjusting ply socks Initiated HEP at sink for balance and standing tolerance performed 10 reps of each, see HEP section for details PT demo and verbal cues on negotiating curb and ramp.  Patient negotiated with standard rolling walker with supervision for each PT demo and verbal cues for stair navigation. Had him go down/up 3 steps in cinic hall with one UE support forwards then performed another 3 steps down/up going sideways which allows him to use bilat UE support from one rail and this was easier and more safe for him at this time Sidestepping at countertop 3 round trips with minimal UE support Retrowalking at counter top 3 round trips with one UE support  09/20/2022: Prosthetic Training with TTA prosthesis: 3 areas at distal limb where he had blisters appears to have healed except epidural layer of skin.  No signs of infection.  Patient recommending to increase wear to 2 hours 3 times a day with 2 or more greater hour break between wear.  Patient verbalized understanding. PT instructed patient and wife in adjusting ply socks with limb volume changes.  Patient donned and ambulated with too few, too many and correct ply fit.  PT also recommended that when he lays the house he  should take 2 #1 ply and 1 #3 ply socks to adjust as  needed.  PT instructed that he may need to start his days with greater socks over time as his legs shrinks and during the day as weightbearing pushes fluid out of his residual limb.  Patient and wife verbalized general understanding of how to adjust ply socks. Patient's wife reports she ordered a standard folding rolling walker.  PT demonstrated how to load and unload the walker if patient is trying to drive.  PT recommended that patient return to driving and low traffic situations initially.  His below-knee amputations on the left side so should not affect his driving have an automatic car.  Patient and wife verbalized understanding. PT demo and verbal cues on negotiating curb and ramp.  Patient negotiated with standard rolling walker and with rollator walker with supervision. PT demo & verbal cues on sit to / from stand from a 18" chair without armrest using UEs on seat.  Patient performed with supervision.  09/18/2022: Prosthetic Training with TTA prosthesis: Pt reviewed & instructed in donning prosthesis including pin on liner orientation.  Pt able to don without assistance with PT cues. He reports better understanding.  Blisters on distal limb are drying with no signs of infection or increase in size.  PT demo & verbal cues on sit to /from stand technique.  5 reps from 22" w/c using armrests with RW in front with goal to not touch for stabilization unless necessary for balance. He only had to touch RW 1 of 5 reps. Progressed to 18" chair pushing off seat 3 reps.  PT demo & verbal cues on safe rollator walker use including sit /stand from seat. Pt amb 40' with rollator walker with supervision.  He was able to sit /stand from seat with supervision. Pt amb 3' backwards with locked rollator with PT cues.    09/12/2022:   PATIENT EDUCATION: PATIENT EDUCATED ON FOLLOWING PROSTHETIC CARE: Education details:  Skin check, Residual limb care, Prosthetic  cleaning, Correct ply sock adjustment, Propper donning, and Proper wear schedule/adjustment sit with prosthetic heel touching ground / not dangling and Prosthetic wear tolerance: 1 hours 2x/day, daily with plan to increase weekly with no skin issues.   Person educated: Patient and Spouse Education method: Explanation, Demonstration, Tactile cues, and Verbal cues Education comprehension: verbalized understanding, returned demonstration, verbal cues required, tactile cues required, and needs further education  HOME EXERCISE PROGRAM: 09/25/22 Do each exercise 1-2  times per day Do each exercise 5-10 repetitions Hold each exercise for 2 seconds to feel your location  AT SINK FIND YOUR MIDLINE POSITION AND PLACE FEET EQUAL DISTANCE FROM THE MIDLINE.  Try to find this position when standing still for activities.   USE TAPE ON FLOOR TO MARK THE MIDLINE POSITION which is even with middle of sink.  You also should try to feel with your limb pressure in socket.  You are trying to feel with limb what you used to feel with the bottom of your foot.  Side to Side Shift: Moving your hips only (not shoulders): move weight onto your left leg, HOLD/FEEL pressure in socket.  Move back to equal weight on each leg, HOLD/FEEL pressure in socket. Move weight onto your right leg, HOLD/FEEL pressure in socket. Move back to equal weight on each leg, HOLD/FEEL pressure in socket. Repeat.  Start with both hands on sink, progress to hand on prosthetic side only, then no hands.  Front to Back Shift: Moving your hips only (not shoulders): move your weight forward onto your toes, HOLD/FEEL pressure in socket.  Move your weight back to equal Flat Foot on both legs, HOLD/FEEL  pressure in socket. Move your weight back onto your heels, HOLD/FEEL  pressure in socket. Move your weight back to equal on both legs, HOLD/FEEL  pressure in socket. Repeat.  Start with both hands on sink, progress to hand on prosthetic side only, then no hands.   Moving Cones / Cups: With equal weight on each leg: Hold on with one hand the first time, then progress to no hand supports. Move cups from one side of sink to the other. Place cups ~2" out of your reach, progress to 10" beyond reach.  Place one hand in middle of sink and reach with other hand. Do both arms.  Then hover one hand and move cups with other hand.  Overhead/Upward Reaching: alternated reaching up to top cabinets or ceiling if no cabinets present. Keep equal weight on each leg. Start with one hand support on counter while other hand reaches and progress to no hand support with reaching.  ace one hand in middle of sink and reach with other hand. Do both arms.  Then hover one hand and move cups with other hand.  5.   Looking Over Shoulders: With equal weight on each leg: alternate turning to look over your shoulders with one hand support on counter as needed.  Start with head motions only to look in front of shoulder, then even with shoulder and progress to looking behind you. To look to side, move head /eyes, then shoulder on side looking pulls back, shift more weight to side looking and pull hip back. Place one hand in middle of sink and let go with other hand so your shoulder can pull back. Switch hands to look other way.   Then hover one hand and look over shoulder. If looking right, use left hand at sink. If looking left, use right hand at sink. 6.  Stepping with leg that is not amputated:  Move items under cabinet out of your way. Shift your hips/pelvis so weight on prosthesis. Tighten muscles in hip on prosthetic side.  SLOWLY step other leg so front of foot is in cabinet. Then step back to floor.   ASSESSMENT:  CLINICAL IMPRESSION: Initiated HEP for standing balance and standing tolerance at sink. He showed good understanding and return demo. We reviewed navigating stairs and I did show him how to do this sideways which allows him to use bilat UE support from one rail since his steps at  home the rails are too far apart to use both. He will continue to benefit from skilled PT   OBJECTIVE IMPAIRMENTS: Abnormal gait, cardiopulmonary status limiting activity, decreased activity tolerance, decreased balance, decreased endurance, decreased knowledge of condition, decreased knowledge of use of DME, decreased mobility, difficulty walking, decreased strength, increased edema, prosthetic dependency , obesity, and pain.   ACTIVITY LIMITATIONS: carrying, lifting, bending, standing, stairs, transfers, and locomotion level  PARTICIPATION LIMITATIONS: cleaning, driving, and community activity  PERSONAL FACTORS: Fitness, Time since onset of injury/illness/exacerbation, and 3+ comorbidities: see PMH  are also affecting patient's functional outcome.   REHAB POTENTIAL: Good  CLINICAL DECISION MAKING: Evolving/moderate complexity  EVALUATION COMPLEXITY: Moderate   GOALS: Goals reviewed with patient? Yes  SHORT TERM GOALS: Target date: 10/13/2022  Patient donnes prosthesis modified independent & verbalizes proper cleaning. Baseline: SEE OBJECTIVE DATA Goal status: ongoing 09/20/2022 2.  Patient tolerates prosthesis >6 hrs total /day without skin issues or limb pain <3/10 after standing. Baseline: SEE OBJECTIVE DATA  Goal status: ongoing 09/20/2022  3.  Patient able to reach 7" and look over both shoulders without UE support with supervision. Baseline: SEE OBJECTIVE DATA Goal status: ongoing 09/20/2022  4. Patient ambulates 45' with RW & prosthesis with supervision. Baseline: SEE OBJECTIVE DATA Goal status: ongoing 09/20/2022  5. Patient negotiates ramps & curbs with RW & prosthesis with minA. Baseline: SEE OBJECTIVE DATA Goal status: ongoing 09/20/2022  Nielsen TERM GOALS: Target date: 11/10/2022  Patient demonstrates & verbalized understanding of prosthetic care to enable safe utilization of prosthesis. Baseline: SEE OBJECTIVE DATA Goal status: ongoing 09/20/2022  Patient tolerates  prosthesis wear >90% of awake hours without skin or limb pain issues. Baseline: SEE OBJECTIVE DATA Goal status: ongoing 09/20/2022  Berg Balance score >/= 36/56 to indicate lower fall risk Baseline: SEE OBJECTIVE DATA Goal status: ongoing 09/20/2022  Patient ambulates >300' with RW / rollator walker & prosthesis modified independently Baseline: SEE OBJECTIVE DATA Goal status: ongoing 09/20/2022  Patient negotiates ramps, curbs with RW / rollator walker & prosthesis modified independently. Baseline: SEE OBJECTIVE DATA Goal status: ongoing 09/20/2022   PLAN:  PT FREQUENCY: 2x/week  PT DURATION: 8 weeks  PLANNED INTERVENTIONS: Therapeutic exercises, Therapeutic activity, Neuromuscular re-education, Balance training, Gait training, Patient/Family education, Self Care, Stair training, Vestibular training, Prosthetic training, DME instructions, and physical performance testing  PLAN FOR NEXT SESSION:  check residual limb and instruct in any prosthetic care issues that arise, how is HEP going, continue to work on stairs  April Manson, PT, [name] 09/25/2022, 4:28 PM

## 2022-09-26 ENCOUNTER — Other Ambulatory Visit (INDEPENDENT_AMBULATORY_CARE_PROVIDER_SITE_OTHER): Payer: Self-pay | Admitting: Nurse Practitioner

## 2022-09-26 DIAGNOSIS — I739 Peripheral vascular disease, unspecified: Secondary | ICD-10-CM

## 2022-09-28 ENCOUNTER — Ambulatory Visit: Payer: No Typology Code available for payment source | Admitting: Physical Therapy

## 2022-09-28 ENCOUNTER — Encounter: Payer: Self-pay | Admitting: Physical Therapy

## 2022-09-28 DIAGNOSIS — L98498 Non-pressure chronic ulcer of skin of other sites with other specified severity: Secondary | ICD-10-CM

## 2022-09-28 DIAGNOSIS — M6281 Muscle weakness (generalized): Secondary | ICD-10-CM | POA: Diagnosis not present

## 2022-09-28 DIAGNOSIS — R2681 Unsteadiness on feet: Secondary | ICD-10-CM

## 2022-09-28 DIAGNOSIS — R2689 Other abnormalities of gait and mobility: Secondary | ICD-10-CM

## 2022-09-28 DIAGNOSIS — Z7409 Other reduced mobility: Secondary | ICD-10-CM

## 2022-09-28 DIAGNOSIS — M79662 Pain in left lower leg: Secondary | ICD-10-CM

## 2022-09-28 NOTE — Therapy (Signed)
OUTPATIENT PHYSICAL THERAPY PROSTHETIC TREATMENT   Patient Name: [name] MRN: [id number] DOB:25-May-1956, 67 y.o., male Today's Date: 09/28/2022  PCP: Nelwyn Salisbury, MD REFERRING PROVIDER: Sheppard Plumber, NP  END OF SESSION:  PT End of Session - 09/28/22 1554     Visit Number 5    Number of Visits 17    Date for PT Re-Evaluation 11/10/22    Authorization Type [employer]    Authorization Time Period 20% co-insurance, 89 PT visits remaining    Authorization - Visit Number 4    Authorization - Number of Visits 89    Progress Note Due on Visit 10    PT Start Time 1512    PT Stop Time 1555    PT Time Calculation (min) 43 min    Equipment Utilized During Treatment Gait belt    Activity Tolerance Patient tolerated treatment well;Patient limited by pain;Patient limited by fatigue    Behavior During Therapy [facility] for tasks assessed/performed               Past Medical History:  Diagnosis Date   CAD (coronary artery disease)    a. 05/2022 Cath: LM nl, LAD 51m/75m, 70d, D3 90, LCX 68m, 60d, OM1 sev diff dzs, OM2 85p, OM2 subbranch sev diff dzs, RCA 70p/m, 74m.   CHF (congestive heart failure)    Chronic HFrEF (heart failure with reduced ejection fraction)    a. 05/2022 Echo: EF 25-30%, mild LVH, GrII DD, mid-apical, anteroseptal, and apical HK. Nl RV fxn. RVSP 51.44mmHg. Mildly dil LA. Mod MR/TR. Mild AS/AI.   Diabetes mellitus    Hematospermia    Hypertension    Ischemic cardiomyopathy    a. 05/2022 Echo: EF 25-30%.   Moderate Mitral regurgitation    Moderate tricuspid regurgitation    Nephrolithiasis 02/07/2007   PAD (peripheral artery disease)    a. 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   Viral meningitis    Past Surgical History:  Procedure Laterality Date   AMPUTATION Left 05/19/2022   Procedure: AMPUTATION BELOW KNEE;  Surgeon: Renford Dills, MD;  Location: [facility] ORS;  Service: Vascular;  Laterality: Left;   CHOLECYSTECTOMY  12-07-14   laparoscopic, per Dr. Rayburn Ma    LOWER  EXTREMITY ANGIOGRAPHY Left 05/16/2022   Procedure: Lower Extremity Angiography;  Surgeon: Renford Dills, MD;  Location: [facility] INVASIVE CV LAB;  Service: Cardiovascular;  Laterality: Left;   RIGHT/LEFT HEART CATH AND CORONARY ANGIOGRAPHY N/A 06/02/2022   Procedure: RIGHT/LEFT HEART CATH AND CORONARY ANGIOGRAPHY;  Surgeon: Yvonne Kendall, MD;  Location: [facility] INVASIVE CV LAB;  Service: Cardiovascular;  Laterality: N/A;   Patient Active Problem List   Diagnosis Date Noted   Pneumonia of both upper lobes due to infectious organism 06/14/2022   Dilated cardiomyopathy 06/04/2022   Coronary artery disease of native artery of native heart with stable angina pectoris 06/03/2022   Ischemic cardiomyopathy 06/03/2022   PAD (peripheral artery disease) 06/02/2022   Diabetes mellitus type 2 with complications 06/02/2022   Elevated troponin 06/02/2022   HFrEF (heart failure with reduced ejection fraction) 05/31/2022   Demand ischemia 05/27/2022   Controlled type 2 diabetes mellitus with hyperglycemia, with [place]-term current use of insulin 05/27/2022   Anemia 05/27/2022   Acute on chronic HFrEF (heart failure with reduced ejection fraction) 05/27/2022   S/P BKA (below knee amputation) unilateral, left 05/19/2022 (HCC) 05/27/2022   Open displaced avulsion fracture of tuberosity of left calcaneus 05/23/2022   AMS (altered mental status) 05/21/2022   Diabetic ulcer of  left heel associated with diabetes mellitus due to underlying condition 05/17/2022   Hypertensive urgency 05/16/2022   Gangrene of left foot 05/16/2022   Pyogenic inflammation of bone 05/16/2022   Diabetic retinopathy associated with type 2 diabetes mellitus 01/02/2022   Hypothyroidism 08/23/2021   Type 2 diabetes mellitus with diabetic neuropathy, unspecified 04/16/2020   Cataracts, bilateral 04/16/2020   Sinus tachycardia 01/14/2020   Insomnia 01/07/2020   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 2 diabetes mellitus 07/27/2017   Generalized anxiety  disorder 07/27/2017   Chronic right flank pain 12/20/2015   Hyperkalemia 10/21/2014   Chest pain 09/21/2014   ADHD 06/22/2010   ERECTILE DYSFUNCTION, ORGANIC 06/22/2010   MORBID OBESITY 08/04/2008   NEPHROLITHIASIS, HX OF 03/22/2007   Essential hypertension 02/22/2007    ONSET DATE: prosthesis delivery 08/29/2022  REFERRING DIAG: Z89.519 (ICD-10-CM) - Hx of BKA   THERAPY DIAG:  Other abnormalities of gait and mobility  Unsteadiness on feet  Muscle weakness (generalized)  Non-pressure chronic ulcer of skin of other sites with other specified severity  Pain in left lower leg  Impaired functional mobility and activity tolerance  Rationale for Evaluation and Treatment: Rehabilitation  SUBJECTIVE:   SUBJECTIVE STATEMENT: He relays he has been doing the HEP, his back is sore from this but not bad, he is not having any leg pain today  Pt accompanied by: significant other  PERTINENT HISTORY: HTN, IDDM2, diabetic neuropathy, PVD, hypothyroidism, viral meningitis  PAIN:  Are you having pain? No   PRECAUTIONS: Fall  WEIGHT BEARING RESTRICTIONS: No  FALLS: Has patient fallen in last 6 months? No  LIVING ENVIRONMENT: Lives with: lives with their spouse Lives in: House  Home Access: Ramped entrance primary or option of stairs Home layout: One level Stairs: Yes: External: 4-5 steps; bilateral but cannot reach both Has following equipment at home: Single point cane, Walker - 4 wheeled, Wheelchair (manual), Shower bench, bed side commode, Grab bars, and Ramped entry  PLOF: Independent, Independent with household mobility with device, and Independent with community mobility with device    PATIENT GOALS:  to use prosthesis to be mobile to enable most appropriate cardiac surgery.   OBJECTIVE:  COGNITION: Overall cognitive status: Within functional limits for tasks assessed  POSTURE: rounded shoulders, forward head, flexed trunk , and weight shift right  LOWER EXTREMITY  ROM:  ROM P:passive  A:active Right eval Left eval  Hip flexion    Hip extension    Hip abduction    Hip adduction    Hip internal rotation    Hip external rotation    Knee flexion    Knee extension    Ankle dorsiflexion    Ankle plantarflexion    Ankle inversion    Ankle eversion     (Blank rows = not tested)  LOWER EXTREMITY MMT:  MMT Right eval Left eval  Hip flexion    Hip extension    Hip abduction    Hip adduction    Hip internal rotation    Hip external rotation    Knee flexion    Knee extension    Ankle dorsiflexion    Ankle plantarflexion    Ankle inversion    Ankle eversion    (Blank rows = not tested)  TRANSFERS: Sit to stand: SBA 22" w/c using armrests using back of legs against w/c to stabilize and touch on wall to left.   Stand to sit: SBA RW to 22" w/c using armrests   GAIT: Distance walked: 25' Assistive device utilized:  Walker - 2 wheeled and left TTA prosthesis Level of assistance: Min A Gait pattern: step to pattern, decreased step length- Right, decreased stance time- Left, decreased hip/knee flexion- Left, Left hip hike, knee flexed in stance- Left, antalgic, lateral hip instability, trunk flexed, and abducted- Left Comments: excessive BUE weight bearing.   FUNCTIONAL TESTs:  Sharlene Motts Balance Scale: 14/56   CARDIOVASCULAR RESPONSE: Functional activity: gait & standing balance Pre-activity vitals: HR: 65 SpO2: 100% Post-activity vitals: HR: 77 SpO2: 96%  CURRENT PROSTHETIC WEAR ASSESSMENT: Evaluation / 09/12/2022:  Patient is dependent with: skin check, residual limb care, care of non-amputated limb, prosthetic cleaning, ply sock cleaning, correct ply sock adjustment, proper wear schedule/adjustment, and proper weight-bearing schedule/adjustment Donning prosthesis: Min A Doffing prosthesis: SBA Prosthetic wear tolerance: 1 hours, 2x/day, 3 days for first 3 of 7 days since delivery but stopped due to skin issue.  Prosthetic weight  bearing tolerance: 5 minutes PWB LLE with prosthesis limb pain 5/10 Edema: pitting Residual limb condition: water blisters 17mm x 3 mm distal limb,  pt & wife report incision at distal tibia cracked open with prosthesis wear for first 3 days.  No hair growth, shiny skin, pale color, normal temperature & moisture.  Cylindrical shape.  Prosthetic description: silicon liner with pin lock, secondary interface pelite liner, flexible keel foot.     TODAY'S TREATMENT:                                                                                                                             DATE:  09/25/2022: Prosthetic Training with TTA prosthesis: Gait with RW 75 feet X 2 with supervision Gait in bars with one UE support from Rt UE (to simulate cane) up/down X 4 trips Balance on airex pad 1 min, progressed to head turns X 10 each side and head nods X 10 Step ups onto 6 inch step leading with left leg (for strength on left) X 5 fwd with one UE support, and X 5 lateral using bilat UE support  Threx: Nu Step L5 X 6 min UE/LE Leg press DL 16X 10, then 09# X 10, then Lt leg only 31# 2X10  09/25/2022: Prosthetic Training with TTA prosthesis: Reviewed adjusting ply socks Initiated HEP at sink for balance and standing tolerance performed 10 reps of each, see HEP section for details PT demo and verbal cues on negotiating curb and ramp.  Patient negotiated with standard rolling walker with supervision for each PT demo and verbal cues for stair navigation. Had him go down/up 3 steps in cinic hall with one UE support forwards then performed another 3 steps down/up going sideways which allows him to use bilat UE support from one rail and this was easier and more safe for him at this time Sidestepping at countertop 3 round trips with minimal UE support Retrowalking at counter top 3 round trips with one UE support  09/20/2022: Prosthetic Training with TTA prosthesis: 3 areas at distal limb where  he had blisters  appears to have healed except epidural layer of skin.  No signs of infection.  Patient recommending to increase wear to 2 hours 3 times a day with 2 or more greater hour break between wear.  Patient verbalized understanding. PT instructed patient and wife in adjusting ply socks with limb volume changes.  Patient donned and ambulated with too few, too many and correct ply fit.  PT also recommended that when he lays the house he should take 2 #1 ply and 1 #3 ply socks to adjust as needed.  PT instructed that he may need to start his days with greater socks over time as his legs shrinks and during the day as weightbearing pushes fluid out of his residual limb.  Patient and wife verbalized general understanding of how to adjust ply socks. Patient's wife reports she ordered a standard folding rolling walker.  PT demonstrated how to load and unload the walker if patient is trying to drive.  PT recommended that patient return to driving and low traffic situations initially.  His below-knee amputations on the left side so should not affect his driving have an automatic car.  Patient and wife verbalized understanding. PT demo and verbal cues on negotiating curb and ramp.  Patient negotiated with standard rolling walker and with rollator walker with supervision. PT demo & verbal cues on sit to / from stand from a 18" chair without armrest using UEs on seat.  Patient performed with supervision.  09/18/2022: Prosthetic Training with TTA prosthesis: Pt reviewed & instructed in donning prosthesis including pin on liner orientation.  Pt able to don without assistance with PT cues. He reports better understanding.  Blisters on distal limb are drying with no signs of infection or increase in size.  PT demo & verbal cues on sit to /from stand technique.  5 reps from 22" w/c using armrests with RW in front with goal to not touch for stabilization unless necessary for balance. He only had to touch RW 1 of 5 reps. Progressed to  18" chair pushing off seat 3 reps.  PT demo & verbal cues on safe rollator walker use including sit /stand from seat. Pt amb 40' with rollator walker with supervision.  He was able to sit /stand from seat with supervision. Pt amb 3' backwards with locked rollator with PT cues.    09/12/2022:   PATIENT EDUCATION: PATIENT EDUCATED ON FOLLOWING PROSTHETIC CARE: Education details:  Skin check, Residual limb care, Prosthetic cleaning, Correct ply sock adjustment, Propper donning, and Proper wear schedule/adjustment sit with prosthetic heel touching ground / not dangling and Prosthetic wear tolerance: 1 hours 2x/day, daily with plan to increase weekly with no skin issues.   Person educated: Patient and Spouse Education method: Explanation, Demonstration, Tactile cues, and Verbal cues Education comprehension: verbalized understanding, returned demonstration, verbal cues required, tactile cues required, and needs further education  HOME EXERCISE PROGRAM: 09/25/22 Do each exercise 1-2  times per day Do each exercise 5-10 repetitions Hold each exercise for 2 seconds to feel your location  AT SINK FIND YOUR MIDLINE POSITION AND PLACE FEET EQUAL DISTANCE FROM THE MIDLINE.  Try to find this position when standing still for activities.   USE TAPE ON FLOOR TO MARK THE MIDLINE POSITION which is even with middle of sink.  You also should try to feel with your limb pressure in socket.  You are trying to feel with limb what you used to feel with the bottom of your foot.  Side to Side Shift: Moving your hips only (not shoulders): move weight onto your left leg, HOLD/FEEL pressure in socket.  Move back to equal weight on each leg, HOLD/FEEL pressure in socket. Move weight onto your right leg, HOLD/FEEL pressure in socket. Move back to equal weight on each leg, HOLD/FEEL pressure in socket. Repeat.  Start with both hands on sink, progress to hand on prosthetic side only, then no hands.  Front to Back Shift: Moving  your hips only (not shoulders): move your weight forward onto your toes, HOLD/FEEL pressure in socket. Move your weight back to equal Flat Foot on both legs, HOLD/FEEL  pressure in socket. Move your weight back onto your heels, HOLD/FEEL  pressure in socket. Move your weight back to equal on both legs, HOLD/FEEL  pressure in socket. Repeat.  Start with both hands on sink, progress to hand on prosthetic side only, then no hands.  Moving Cones / Cups: With equal weight on each leg: Hold on with one hand the first time, then progress to no hand supports. Move cups from one side of sink to the other. Place cups ~2" out of your reach, progress to 10" beyond reach.  Place one hand in middle of sink and reach with other hand. Do both arms.  Then hover one hand and move cups with other hand.  Overhead/Upward Reaching: alternated reaching up to top cabinets or ceiling if no cabinets present. Keep equal weight on each leg. Start with one hand support on counter while other hand reaches and progress to no hand support with reaching.  ace one hand in middle of sink and reach with other hand. Do both arms.  Then hover one hand and move cups with other hand.  5.   Looking Over Shoulders: With equal weight on each leg: alternate turning to look over your shoulders with one hand support on counter as needed.  Start with head motions only to look in front of shoulder, then even with shoulder and progress to looking behind you. To look to side, move head /eyes, then shoulder on side looking pulls back, shift more weight to side looking and pull hip back. Place one hand in middle of sink and let go with other hand so your shoulder can pull back. Switch hands to look other way.   Then hover one hand and look over shoulder. If looking right, use left hand at sink. If looking left, use right hand at sink. 6.  Stepping with leg that is not amputated:  Move items under cabinet out of your way. Shift your hips/pelvis so weight on  prosthesis. Tighten muscles in hip on prosthetic side.  SLOWLY step other leg so front of foot is in cabinet. Then step back to floor.   ASSESSMENT:  CLINICAL IMPRESSION: He is progressing well up to this point and we will assess his soreness from increased activity today.   OBJECTIVE IMPAIRMENTS: Abnormal gait, cardiopulmonary status limiting activity, decreased activity tolerance, decreased balance, decreased endurance, decreased knowledge of condition, decreased knowledge of use of DME, decreased mobility, difficulty walking, decreased strength, increased edema, prosthetic dependency , obesity, and pain.   ACTIVITY LIMITATIONS: carrying, lifting, bending, standing, stairs, transfers, and locomotion level  PARTICIPATION LIMITATIONS: cleaning, driving, and community activity  PERSONAL FACTORS: Fitness, Time since onset of injury/illness/exacerbation, and 3+ comorbidities: see PMH  are also affecting patient's functional outcome.   REHAB POTENTIAL: Good  CLINICAL DECISION MAKING: Evolving/moderate complexity  EVALUATION COMPLEXITY: Moderate   GOALS: Goals reviewed with  patient? Yes  SHORT TERM GOALS: Target date: 10/13/2022  Patient donnes prosthesis modified independent & verbalizes proper cleaning. Baseline: SEE OBJECTIVE DATA Goal status: ongoing 09/20/2022 2.  Patient tolerates prosthesis >6 hrs total /day without skin issues or limb pain <3/10 after standing. Baseline: SEE OBJECTIVE DATA Goal status: ongoing 09/20/2022  3.  Patient able to reach 7" and look over both shoulders without UE support with supervision. Baseline: SEE OBJECTIVE DATA Goal status: ongoing 09/20/2022  4. Patient ambulates 60' with RW & prosthesis with supervision. Baseline: SEE OBJECTIVE DATA Goal status: ongoing 09/20/2022  5. Patient negotiates ramps & curbs with RW & prosthesis with minA. Baseline: SEE OBJECTIVE DATA Goal status: ongoing 09/20/2022  [place] TERM GOALS: Target date: 11/10/2022  Patient  demonstrates & verbalized understanding of prosthetic care to enable safe utilization of prosthesis. Baseline: SEE OBJECTIVE DATA Goal status: ongoing 09/20/2022  Patient tolerates prosthesis wear >90% of awake hours without skin or limb pain issues. Baseline: SEE OBJECTIVE DATA Goal status: ongoing 09/20/2022  Berg Balance score >/= 36/56 to indicate lower fall risk Baseline: SEE OBJECTIVE DATA Goal status: ongoing 09/20/2022  Patient ambulates >300' with RW / rollator walker & prosthesis modified independently Baseline: SEE OBJECTIVE DATA Goal status: ongoing 09/20/2022  Patient negotiates ramps, curbs with RW / rollator walker & prosthesis modified independently. Baseline: SEE OBJECTIVE DATA Goal status: ongoing 09/20/2022   PLAN:  PT FREQUENCY: 2x/week  PT DURATION: 8 weeks  PLANNED INTERVENTIONS: Therapeutic exercises, Therapeutic activity, Neuromuscular re-education, Balance training, Gait training, Patient/Family education, Self Care, Stair training, Vestibular training, Prosthetic training, DME instructions, and physical performance testing  PLAN FOR NEXT SESSION:  try gait with SPC, continue to work on stairs and standing balance  April Manson, PT, [name] 09/28/2022, 4:02 PM

## 2022-10-03 ENCOUNTER — Ambulatory Visit (INDEPENDENT_AMBULATORY_CARE_PROVIDER_SITE_OTHER): Payer: No Typology Code available for payment source | Admitting: Physical Therapy

## 2022-10-03 ENCOUNTER — Encounter: Payer: Self-pay | Admitting: Physical Therapy

## 2022-10-03 DIAGNOSIS — R2689 Other abnormalities of gait and mobility: Secondary | ICD-10-CM

## 2022-10-03 DIAGNOSIS — R2681 Unsteadiness on feet: Secondary | ICD-10-CM

## 2022-10-03 DIAGNOSIS — L98498 Non-pressure chronic ulcer of skin of other sites with other specified severity: Secondary | ICD-10-CM

## 2022-10-03 DIAGNOSIS — M79662 Pain in left lower leg: Secondary | ICD-10-CM | POA: Diagnosis not present

## 2022-10-03 DIAGNOSIS — Z7409 Other reduced mobility: Secondary | ICD-10-CM

## 2022-10-03 NOTE — Therapy (Signed)
OUTPATIENT PHYSICAL THERAPY PROSTHETIC TREATMENT   Patient Name: [name] MRN: [id number] DOB:[birth year], 67 y.o., male Today's Date: 10/03/2022  PCP: Nelwyn Salisbury, MD REFERRING PROVIDER: Sheppard Plumber, NP  END OF SESSION:  PT End of Session - 10/03/22 1632     Visit Number 6    Number of Visits 17    Date for PT Re-Evaluation 11/10/22    Authorization Type [employer]    Authorization Time Period 20% co-insurance, 89 PT visits remaining    Authorization - Number of Visits 89    Progress Note Due on Visit 10    PT Start Time 1600    PT Stop Time 1644    PT Time Calculation (min) 44 min    Equipment Utilized During Treatment Gait belt    Activity Tolerance Patient tolerated treatment well    Behavior During Therapy WFL for tasks assessed/performed                Past Medical History:  Diagnosis Date   CAD (coronary artery disease)    a. 05/2022 Cath: LM nl, LAD 21m/35m, 70d, D3 90, LCX 45m, 60d, OM1 sev diff dzs, OM2 85p, OM2 subbranch sev diff dzs, RCA 70p/m, 64m.   CHF (congestive heart failure)    Chronic HFrEF (heart failure with reduced ejection fraction)    a. 05/2022 Echo: EF 25-30%, mild LVH, GrII DD, mid-apical, anteroseptal, and apical HK. Nl RV fxn. RVSP 51.82mmHg. Mildly dil LA. Mod MR/TR. Mild AS/AI.   Diabetes mellitus    Hematospermia    Hypertension    Ischemic cardiomyopathy    a. 05/2022 Echo: EF 25-30%.   Moderate Mitral regurgitation    Moderate tricuspid regurgitation    Nephrolithiasis 02/07/2007   PAD (peripheral artery disease)    a. 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   Viral meningitis    Past Surgical History:  Procedure Laterality Date   AMPUTATION Left 05/19/2022   Procedure: AMPUTATION BELOW KNEE;  Surgeon: Renford Dills, MD;  Location: [facility];  Service: Vascular;  Laterality: Left;   CHOLECYSTECTOMY  12-07-14   laparoscopic, per Dr. Rayburn Ma    LOWER EXTREMITY ANGIOGRAPHY Left 05/16/2022   Procedure: Lower Extremity Angiography;  Surgeon:  Renford Dills, MD;  Location: [facility];  Service: Cardiovascular;  Laterality: Left;   RIGHT/LEFT HEART CATH AND CORONARY ANGIOGRAPHY N/A 06/02/2022   Procedure: RIGHT/LEFT HEART CATH AND CORONARY ANGIOGRAPHY;  Surgeon: Yvonne Kendall, MD;  Location: [facility];  Service: Cardiovascular;  Laterality: N/A;   Patient Active Problem List   Diagnosis Date Noted   Pneumonia of both upper lobes due to infectious organism 06/14/2022   Dilated cardiomyopathy 06/04/2022   Coronary artery disease of native artery of native heart with stable angina pectoris 06/03/2022   Ischemic cardiomyopathy 06/03/2022   PAD (peripheral artery disease) 06/02/2022   Diabetes mellitus type 2 with complications 06/02/2022   Elevated troponin 06/02/2022   HFrEF (heart failure with reduced ejection fraction) 05/31/2022   Demand ischemia 05/27/2022   Controlled type 2 diabetes mellitus with hyperglycemia, with [place]-term current use of insulin 05/27/2022   Anemia 05/27/2022   Acute on chronic HFrEF (heart failure with reduced ejection fraction) 05/27/2022   S/P BKA (below knee amputation) unilateral, left 05/19/2022 (HCC) 05/27/2022   Open displaced avulsion fracture of tuberosity of left calcaneus 05/23/2022   AMS (altered mental status) 05/21/2022   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 due to underlying condition 05/17/2022  Hypertensive urgency 05/16/2022   Gangrene of left foot 05/16/2022   Pyogenic inflammation of bone 05/16/2022   Diabetic retinopathy associated with type 2 diabetes mellitus 01/02/2022   Hypothyroidism 08/23/2021   Type 2 diabetes mellitus with diabetic neuropathy, unspecified 04/16/2020   Cataracts, bilateral 04/16/2020   Sinus tachycardia 01/14/2020   Insomnia 01/07/2020   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 2 diabetes mellitus 07/27/2017   Generalized anxiety disorder 07/27/2017   Chronic right flank pain 12/20/2015   Hyperkalemia 10/21/2014    Chest pain 09/21/2014   ADHD 06/22/2010   ERECTILE DYSFUNCTION, ORGANIC 06/22/2010   MORBID OBESITY 08/04/2008   NEPHROLITHIASIS, HX OF 03/22/2007   Essential hypertension 02/22/2007    ONSET DATE: prosthesis delivery 08/29/2022  REFERRING DIAG: Z89.519 (ICD-10-CM) - Hx of BKA   THERAPY DIAG:  Other abnormalities of gait and mobility  Unsteadiness on feet  Non-pressure chronic ulcer of skin of other sites with other specified severity  Pain in left lower leg  Impaired functional mobility and activity tolerance  Rationale for Evaluation and Treatment: Rehabilitation  SUBJECTIVE:   SUBJECTIVE STATEMENT: He denies any pain today, he drove himself today   PERTINENT HISTORY: HTN, IDDM2, diabetic neuropathy, PVD, hypothyroidism, viral meningitis  PAIN:  Are you having pain? No   PRECAUTIONS: Fall  WEIGHT BEARING RESTRICTIONS: No  FALLS: Has patient fallen in last 6 months? No  LIVING ENVIRONMENT: Lives with: lives with their spouse Lives in: House  Home Access: Ramped entrance primary or option of stairs Home layout: One level Stairs: Yes: External: 4-5 steps; bilateral but cannot reach both Has following equipment at home: Single point cane, Walker - 4 wheeled, Wheelchair (manual), Shower bench, bed side commode, Grab bars, and Ramped entry  PLOF: Independent, Independent with household mobility with device, and Independent with community mobility with device    PATIENT GOALS:  to use prosthesis to be mobile to enable most appropriate cardiac surgery.   OBJECTIVE:  COGNITION: Overall cognitive status: Within functional limits for tasks assessed  POSTURE: rounded shoulders, forward head, flexed trunk , and weight shift right  LOWER EXTREMITY ROM:  ROM P:passive  A:active Right eval Left eval  Hip flexion    Hip extension    Hip abduction    Hip adduction    Hip internal rotation    Hip external rotation    Knee flexion    Knee extension    Ankle  dorsiflexion    Ankle plantarflexion    Ankle inversion    Ankle eversion     (Blank rows = not tested)  LOWER EXTREMITY MMT:  MMT Right eval Left eval  Hip flexion    Hip extension    Hip abduction    Hip adduction    Hip internal rotation    Hip external rotation    Knee flexion    Knee extension    Ankle dorsiflexion    Ankle plantarflexion    Ankle inversion    Ankle eversion    (Blank rows = not tested)  TRANSFERS: Sit to stand: SBA 22" w/c using armrests using back of legs against w/c to stabilize and touch on wall to left.   Stand to sit: SBA RW to 22" w/c using armrests   GAIT: Distance walked: 25' Assistive device utilized: Environmental consultant - 2 wheeled and left TTA prosthesis Level of assistance: Min A Gait pattern: step to pattern, decreased step length- Right, decreased stance time- Left, decreased hip/knee flexion- Left, Left hip hike, knee flexed in stance- Left,  antalgic, lateral hip instability, trunk flexed, and abducted- Left Comments: excessive BUE weight bearing.   FUNCTIONAL TESTs:  Sharlene Motts Balance Scale: 14/56   CARDIOVASCULAR RESPONSE: Functional activity: gait & standing balance Pre-activity vitals: HR: 65 SpO2: 100% Post-activity vitals: HR: 77 SpO2: 96%  CURRENT PROSTHETIC WEAR ASSESSMENT: Evaluation / 09/12/2022:  Patient is dependent with: skin check, residual limb care, care of non-amputated limb, prosthetic cleaning, ply sock cleaning, correct ply sock adjustment, proper wear schedule/adjustment, and proper weight-bearing schedule/adjustment Donning prosthesis: Min A Doffing prosthesis: SBA Prosthetic wear tolerance: 1 hours, 2x/day, 3 days for first 3 of 7 days since delivery but stopped due to skin issue.  Prosthetic weight bearing tolerance: 5 minutes PWB LLE with prosthesis limb pain 5/10 Edema: pitting Residual limb condition: water blisters 17mm x 3 mm distal limb,  pt & wife report incision at distal tibia cracked open with prosthesis  wear for first 3 days.  No hair growth, shiny skin, pale color, normal temperature & moisture.  Cylindrical shape.  Prosthetic description: silicon liner with pin lock, secondary interface pelite liner, flexible keel foot.     TODAY'S TREATMENT:                                                                                                                             DATE:  10/03/2022: Prosthetic Training with TTA prosthesis: Gait in bars with one UE support from Rt UE (to simulate cane) up/down X 4 trips Sidestepping in bars without UE support 3 round trips, intermittent hand touch PRN Gait with SPC with hand held assist for 10 feet progressed to CGA without hand held support 30 feet X 2, progressed to CGA and carry bottle of water 30 feet and CGA carry tray of food X 30 feet all with 90 degree turns to sit into chair with arm rests Navigated up 4 inch curb step and 6 inch curb step with SPC and CGA, cues and demo for technique Navigated up/down ramp with SPC and CGA to min A for balance, cues and demo for technique 4 square balance with SPC min A overall for balance, 3 reps  Threx: Nu Step L6 X 5 min LE only Leg press DL 16#1 X 10, then Lt leg only 43# 2X10  09/25/2022: Prosthetic Training with TTA prosthesis: Gait with RW 75 feet X 2 with supervision Gait in bars with one UE support from Rt UE (to simulate cane) up/down X 4 trips Balance on airex pad 1 min, progressed to head turns X 10 each side and head nods X 10 Step ups onto 6 inch step leading with left leg (for strength on left) X 5 fwd with one UE support, and X 5 lateral using bilat UE support  Threx: Nu Step L5 X 6 min UE/LE Leg press DL 09U 10, then 04# X 10, then Lt leg only 31# 2X10  09/25/2022: Prosthetic Training with TTA prosthesis: Reviewed adjusting ply socks Initiated HEP at sink for  balance and standing tolerance performed 10 reps of each, see HEP section for details PT demo and verbal cues on negotiating curb and  ramp.  Patient negotiated with standard rolling walker with supervision for each PT demo and verbal cues for stair navigation. Had him go down/up 3 steps in cinic hall with one UE support forwards then performed another 3 steps down/up going sideways which allows him to use bilat UE support from one rail and this was easier and more safe for him at this time Sidestepping at countertop 3 round trips with minimal UE support Retrowalking at counter top 3 round trips with one UE support  09/20/2022: Prosthetic Training with TTA prosthesis: 3 areas at distal limb where he had blisters appears to have healed except epidural layer of skin.  No signs of infection.  Patient recommending to increase wear to 2 hours 3 times a day with 2 or more greater hour break between wear.  Patient verbalized understanding. PT instructed patient and wife in adjusting ply socks with limb volume changes.  Patient donned and ambulated with too few, too many and correct ply fit.  PT also recommended that when he lays the house he should take 2 #1 ply and 1 #3 ply socks to adjust as needed.  PT instructed that he may need to start his days with greater socks over time as his legs shrinks and during the day as weightbearing pushes fluid out of his residual limb.  Patient and wife verbalized general understanding of how to adjust ply socks. Patient's wife reports she ordered a standard folding rolling walker.  PT demonstrated how to load and unload the walker if patient is trying to drive.  PT recommended that patient return to driving and low traffic situations initially.  His below-knee amputations on the left side so should not affect his driving have an automatic car.  Patient and wife verbalized understanding. PT demo and verbal cues on negotiating curb and ramp.  Patient negotiated with standard rolling walker and with rollator walker with supervision. PT demo & verbal cues on sit to / from stand from a 18" chair without armrest  using UEs on seat.  Patient performed with supervision.  09/18/2022: Prosthetic Training with TTA prosthesis: Pt reviewed & instructed in donning prosthesis including pin on liner orientation.  Pt able to don without assistance with PT cues. He reports better understanding.  Blisters on distal limb are drying with no signs of infection or increase in size.  PT demo & verbal cues on sit to /from stand technique.  5 reps from 22" w/c using armrests with RW in front with goal to not touch for stabilization unless necessary for balance. He only had to touch RW 1 of 5 reps. Progressed to 18" chair pushing off seat 3 reps.  PT demo & verbal cues on safe rollator walker use including sit /stand from seat. Pt amb 40' with rollator walker with supervision.  He was able to sit /stand from seat with supervision. Pt amb 3' backwards with locked rollator with PT cues.    09/12/2022:   PATIENT EDUCATION: PATIENT EDUCATED ON FOLLOWING PROSTHETIC CARE: Education details:  Skin check, Residual limb care, Prosthetic cleaning, Correct ply sock adjustment, Propper donning, and Proper wear schedule/adjustment sit with prosthetic heel touching ground / not dangling and Prosthetic wear tolerance: 1 hours 2x/day, daily with plan to increase weekly with no skin issues.   Person educated: Patient and Spouse Education method: Explanation, Demonstration, Tactile cues, and Verbal  cues Education comprehension: verbalized understanding, returned demonstration, verbal cues required, tactile cues required, and needs further education  HOME EXERCISE PROGRAM: 09/25/22 Do each exercise 1-2  times per day Do each exercise 5-10 repetitions Hold each exercise for 2 seconds to feel your location  AT SINK FIND YOUR MIDLINE POSITION AND PLACE FEET EQUAL DISTANCE FROM THE MIDLINE.  Try to find this position when standing still for activities.   USE TAPE ON FLOOR TO MARK THE MIDLINE POSITION which is even with middle of sink.  You also  should try to feel with your limb pressure in socket.  You are trying to feel with limb what you used to feel with the bottom of your foot.  Side to Side Shift: Moving your hips only (not shoulders): move weight onto your left leg, HOLD/FEEL pressure in socket.  Move back to equal weight on each leg, HOLD/FEEL pressure in socket. Move weight onto your right leg, HOLD/FEEL pressure in socket. Move back to equal weight on each leg, HOLD/FEEL pressure in socket. Repeat.  Start with both hands on sink, progress to hand on prosthetic side only, then no hands.  Front to Back Shift: Moving your hips only (not shoulders): move your weight forward onto your toes, HOLD/FEEL pressure in socket. Move your weight back to equal Flat Foot on both legs, HOLD/FEEL  pressure in socket. Move your weight back onto your heels, HOLD/FEEL  pressure in socket. Move your weight back to equal on both legs, HOLD/FEEL  pressure in socket. Repeat.  Start with both hands on sink, progress to hand on prosthetic side only, then no hands.  Moving Cones / Cups: With equal weight on each leg: Hold on with one hand the first time, then progress to no hand supports. Move cups from one side of sink to the other. Place cups ~2" out of your reach, progress to 10" beyond reach.  Place one hand in middle of sink and reach with other hand. Do both arms.  Then hover one hand and move cups with other hand.  Overhead/Upward Reaching: alternated reaching up to top cabinets or ceiling if no cabinets present. Keep equal weight on each leg. Start with one hand support on counter while other hand reaches and progress to no hand support with reaching.  ace one hand in middle of sink and reach with other hand. Do both arms.  Then hover one hand and move cups with other hand.  5.   Looking Over Shoulders: With equal weight on each leg: alternate turning to look over your shoulders with one hand support on counter as needed.  Start with head motions only to look  in front of shoulder, then even with shoulder and progress to looking behind you. To look to side, move head /eyes, then shoulder on side looking pulls back, shift more weight to side looking and pull hip back. Place one hand in middle of sink and let go with other hand so your shoulder can pull back. Switch hands to look other way.   Then hover one hand and look over shoulder. If looking right, use left hand at sink. If looking left, use right hand at sink. 6.  Stepping with leg that is not amputated:  Move items under cabinet out of your way. Shift your hips/pelvis so weight on prosthesis. Tighten muscles in hip on prosthetic side.  SLOWLY step other leg so front of foot is in cabinet. Then step back to floor.   ASSESSMENT:  CLINICAL IMPRESSION:  He continues to progress well and was able move from RW to [facility] for ambulation today with various tasks and balance challenges. He was min A to CGA for balance, he was recommended to get a cane so we can continue to practice with it.   OBJECTIVE IMPAIRMENTS: Abnormal gait, cardiopulmonary status limiting activity, decreased activity tolerance, decreased balance, decreased endurance, decreased knowledge of condition, decreased knowledge of use of DME, decreased mobility, difficulty walking, decreased strength, increased edema, prosthetic dependency , obesity, and pain.   ACTIVITY LIMITATIONS: carrying, lifting, bending, standing, stairs, transfers, and locomotion level  PARTICIPATION LIMITATIONS: cleaning, driving, and community activity  PERSONAL FACTORS: Fitness, Time since onset of injury/illness/exacerbation, and 3+ comorbidities: see PMH  are also affecting patient's functional outcome.   REHAB POTENTIAL: Good  CLINICAL DECISION MAKING: Evolving/moderate complexity  EVALUATION COMPLEXITY: Moderate   GOALS: Goals reviewed with patient? Yes  SHORT TERM GOALS: Target date: 10/13/2022  Patient donnes prosthesis modified independent & verbalizes  proper cleaning. Baseline: SEE OBJECTIVE DATA Goal status: ongoing 09/20/2022 2.  Patient tolerates prosthesis >6 hrs total /day without skin issues or limb pain <3/10 after standing. Baseline: SEE OBJECTIVE DATA Goal status: ongoing 09/20/2022  3.  Patient able to reach 7" and look over both shoulders without UE support with supervision. Baseline: SEE OBJECTIVE DATA Goal status: ongoing 09/20/2022  4. Patient ambulates 30' with RW & prosthesis with supervision. Baseline: SEE OBJECTIVE DATA Goal status: ongoing 09/20/2022  5. Patient negotiates ramps & curbs with RW & prosthesis with minA. Baseline: SEE OBJECTIVE DATA Goal status: ongoing 09/20/2022  [place] TERM GOALS: Target date: 11/10/2022  Patient demonstrates & verbalized understanding of prosthetic care to enable safe utilization of prosthesis. Baseline: SEE OBJECTIVE DATA Goal status: ongoing 09/20/2022  Patient tolerates prosthesis wear >90% of awake hours without skin or limb pain issues. Baseline: SEE OBJECTIVE DATA Goal status: ongoing 09/20/2022  Berg Balance score >/= 36/56 to indicate lower fall risk Baseline: SEE OBJECTIVE DATA Goal status: ongoing 09/20/2022  Patient ambulates >300' with RW / rollator walker & prosthesis modified independently Baseline: SEE OBJECTIVE DATA Goal status: ongoing 09/20/2022  Patient negotiates ramps, curbs with RW / rollator walker & prosthesis modified independently. Baseline: SEE OBJECTIVE DATA Goal status: ongoing 09/20/2022   PLAN:  PT FREQUENCY: 2x/week  PT DURATION: 8 weeks  PLANNED INTERVENTIONS: Therapeutic exercises, Therapeutic activity, Neuromuscular re-education, Balance training, Gait training, Patient/Family education, Self Care, Stair training, Vestibular training, Prosthetic training, DME instructions, and physical performance testing  PLAN FOR NEXT SESSION:  gait with SPC, continue to work on stairs and balance  April Manson, PT, [name] 10/03/2022, 4:36 PM

## 2022-10-04 ENCOUNTER — Ambulatory Visit (INDEPENDENT_AMBULATORY_CARE_PROVIDER_SITE_OTHER): Payer: No Typology Code available for payment source | Admitting: Physical Therapy

## 2022-10-04 ENCOUNTER — Encounter: Payer: Self-pay | Admitting: Physical Therapy

## 2022-10-04 DIAGNOSIS — R2681 Unsteadiness on feet: Secondary | ICD-10-CM | POA: Diagnosis not present

## 2022-10-04 DIAGNOSIS — M6281 Muscle weakness (generalized): Secondary | ICD-10-CM

## 2022-10-04 DIAGNOSIS — R2689 Other abnormalities of gait and mobility: Secondary | ICD-10-CM

## 2022-10-04 DIAGNOSIS — M79662 Pain in left lower leg: Secondary | ICD-10-CM | POA: Diagnosis not present

## 2022-10-04 DIAGNOSIS — L98498 Non-pressure chronic ulcer of skin of other sites with other specified severity: Secondary | ICD-10-CM

## 2022-10-04 DIAGNOSIS — Z7409 Other reduced mobility: Secondary | ICD-10-CM

## 2022-10-04 NOTE — Therapy (Signed)
OUTPATIENT PHYSICAL THERAPY PROSTHETIC TREATMENT   Patient Name: [name] MRN: [id number] DOB:[birth year], 67 y.o., male Today's Date: 10/04/2022  PCP: Nelwyn Salisbury, MD REFERRING PROVIDER: Sheppard Plumber, NP  END OF SESSION:  PT End of Session - 10/04/22 1605     Visit Number 7    Number of Visits 17    Date for PT Re-Evaluation 11/10/22    Authorization Type [employer]    Authorization Time Period 20% co-insurance, 89 PT visits remaining    Authorization - Visit Number 5    Authorization - Number of Visits 89    Progress Note Due on Visit 10    PT Start Time 1514    PT Stop Time 1554    PT Time Calculation (min) 40 min    Equipment Utilized During Treatment Gait belt    Activity Tolerance Patient tolerated treatment well    Behavior During Therapy WFL for tasks assessed/performed                 Past Medical History:  Diagnosis Date   CAD (coronary artery disease)    a. 05/2022 Cath: LM nl, LAD 59m/66m, 70d, D3 90, LCX 53m, 60d, OM1 sev diff dzs, OM2 85p, OM2 subbranch sev diff dzs, RCA 70p/m, 23m.   CHF (congestive heart failure)    Chronic HFrEF (heart failure with reduced ejection fraction)    a. 05/2022 Echo: EF 25-30%, mild LVH, GrII DD, mid-apical, anteroseptal, and apical HK. Nl RV fxn. RVSP 51.47mmHg. Mildly dil LA. Mod MR/TR. Mild AS/AI.   Diabetes mellitus    Hematospermia    Hypertension    Ischemic cardiomyopathy    a. 05/2022 Echo: EF 25-30%.   Moderate Mitral regurgitation    Moderate tricuspid regurgitation    Nephrolithiasis 02/07/2007   PAD (peripheral artery disease)    a. 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   Viral meningitis    Past Surgical History:  Procedure Laterality Date   AMPUTATION Left 05/19/2022   Procedure: AMPUTATION BELOW KNEE;  Surgeon: Renford Dills, MD;  Location: [facility];  Service: Vascular;  Laterality: Left;   CHOLECYSTECTOMY  12-07-14   laparoscopic, per Dr. Rayburn Ma    LOWER EXTREMITY ANGIOGRAPHY Left 05/16/2022   Procedure:  Lower Extremity Angiography;  Surgeon: Renford Dills, MD;  Location: [facility];  Service: Cardiovascular;  Laterality: Left;   RIGHT/LEFT HEART CATH AND CORONARY ANGIOGRAPHY N/A 06/02/2022   Procedure: RIGHT/LEFT HEART CATH AND CORONARY ANGIOGRAPHY;  Surgeon: Yvonne Kendall, MD;  Location: [facility];  Service: Cardiovascular;  Laterality: N/A;   Patient Active Problem List   Diagnosis Date Noted   Pneumonia of both upper lobes due to infectious organism 06/14/2022   Dilated cardiomyopathy 06/04/2022   Coronary artery disease of native artery of native heart with stable angina pectoris 06/03/2022   Ischemic cardiomyopathy 06/03/2022   PAD (peripheral artery disease) 06/02/2022   Diabetes mellitus type 2 with complications 06/02/2022   Elevated troponin 06/02/2022   HFrEF (heart failure with reduced ejection fraction) 05/31/2022   Demand ischemia 05/27/2022   Controlled type 2 diabetes mellitus with hyperglycemia, with Trevor-term current use of insulin 05/27/2022   Anemia 05/27/2022   Acute on chronic HFrEF (heart failure with reduced ejection fraction) 05/27/2022   S/P BKA (below knee amputation) unilateral, left 05/19/2022 (HCC) 05/27/2022   Open displaced avulsion fracture of tuberosity of left calcaneus 05/23/2022   AMS (altered mental status) 05/21/2022   Diabetic ulcer of left heel associated with  diabetes mellitus due to underlying condition 05/17/2022   Hypertensive urgency 05/16/2022   Gangrene of left foot 05/16/2022   Pyogenic inflammation of bone 05/16/2022   Diabetic retinopathy associated with type 2 diabetes mellitus 01/02/2022   Hypothyroidism 08/23/2021   Type 2 diabetes mellitus with diabetic neuropathy, unspecified 04/16/2020   Cataracts, bilateral 04/16/2020   Sinus tachycardia 01/14/2020   Insomnia 01/07/2020   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 2 diabetes mellitus 07/27/2017   Generalized anxiety disorder 07/27/2017   Chronic right flank pain  12/20/2015   Hyperkalemia 10/21/2014   Chest pain 09/21/2014   ADHD 06/22/2010   ERECTILE DYSFUNCTION, ORGANIC 06/22/2010   MORBID OBESITY 08/04/2008   NEPHROLITHIASIS, HX OF 03/22/2007   Essential hypertension 02/22/2007    ONSET DATE: prosthesis delivery 08/29/2022  REFERRING DIAG: Z89.519 (ICD-10-CM) - Hx of BKA   THERAPY DIAG:  Other abnormalities of gait and mobility  Unsteadiness on feet  Non-pressure chronic ulcer of skin of other sites with other specified severity  Pain in left lower leg  Impaired functional mobility and activity tolerance  Muscle weakness (generalized)  Rationale for Evaluation and Treatment: Rehabilitation  SUBJECTIVE:   SUBJECTIVE STATEMENT: He relays some stiffness and soreness in his back.    PERTINENT HISTORY: HTN, IDDM2, diabetic neuropathy, PVD, hypothyroidism, viral meningitis  PAIN:  Are you having pain? No   PRECAUTIONS: Fall  WEIGHT BEARING RESTRICTIONS: No  FALLS: Has patient fallen in last 6 months? No  LIVING ENVIRONMENT: Lives with: lives with their spouse Lives in: House  Home Access: Ramped entrance primary or option of stairs Home layout: One level Stairs: Yes: External: 4-5 steps; bilateral but cannot reach both Has following equipment at home: Single point cane, Walker - 4 wheeled, Wheelchair (manual), Shower bench, bed side commode, Grab bars, and Ramped entry  PLOF: Independent, Independent with household mobility with device, and Independent with community mobility with device    PATIENT GOALS:  to use prosthesis to be mobile to enable most appropriate cardiac surgery.   OBJECTIVE:  COGNITION: Overall cognitive status: Within functional limits for tasks assessed  POSTURE: rounded shoulders, forward head, flexed trunk , and weight shift right  LOWER EXTREMITY ROM:  ROM P:passive  A:active Right eval Left eval  Hip flexion    Hip extension    Hip abduction    Hip adduction    Hip internal rotation     Hip external rotation    Knee flexion    Knee extension    Ankle dorsiflexion    Ankle plantarflexion    Ankle inversion    Ankle eversion     (Blank rows = not tested)  LOWER EXTREMITY MMT:  MMT Right eval Left eval  Hip flexion    Hip extension    Hip abduction    Hip adduction    Hip internal rotation    Hip external rotation    Knee flexion    Knee extension    Ankle dorsiflexion    Ankle plantarflexion    Ankle inversion    Ankle eversion    (Blank rows = not tested)  TRANSFERS: Sit to stand: SBA 22" w/c using armrests using back of legs against w/c to stabilize and touch on wall to left.   Stand to sit: SBA RW to 22" w/c using armrests   GAIT: Distance walked: 25' Assistive device utilized: Environmental consultant - 2 wheeled and left TTA prosthesis Level of assistance: Min A Gait pattern: step to pattern, decreased step length- Right, decreased stance  time- Left, decreased hip/knee flexion- Left, Left hip hike, knee flexed in stance- Left, antalgic, lateral hip instability, trunk flexed, and abducted- Left Comments: excessive BUE weight bearing.   FUNCTIONAL TESTs:  Sharlene Motts Balance Scale: 14/56   CARDIOVASCULAR RESPONSE: Functional activity: gait & standing balance Pre-activity vitals: HR: 65 SpO2: 100% Post-activity vitals: HR: 77 SpO2: 96%  CURRENT PROSTHETIC WEAR ASSESSMENT: Evaluation / 09/12/2022:  Patient is dependent with: skin check, residual limb care, care of non-amputated limb, prosthetic cleaning, ply sock cleaning, correct ply sock adjustment, proper wear schedule/adjustment, and proper weight-bearing schedule/adjustment Donning prosthesis: Min A Doffing prosthesis: SBA Prosthetic wear tolerance: 1 hours, 2x/day, 3 days for first 3 of 7 days since delivery but stopped due to skin issue.  Prosthetic weight bearing tolerance: 5 minutes PWB LLE with prosthesis limb pain 5/10 Edema: pitting Residual limb condition: water blisters 17mm x 3 mm distal limb,  pt  & wife report incision at distal tibia cracked open with prosthesis wear for first 3 days.  No hair growth, shiny skin, pale color, normal temperature & moisture.  Cylindrical shape.  Prosthetic description: silicon liner with pin lock, secondary interface pelite liner, flexible keel foot.     TODAY'S TREATMENT:                                                                                                                             DATE:  10/04/2022: Prosthetic Training with TTA prosthesis: Gait in bars with bilat fingertip support up/down X3 trips Sidestepping in bars without UE support 4 round trips, intermittent hand touch PRN Gait with SPC 140 feet CGA, 100 feet CGA Step ups leading with left leg for strengthening X 10 reps forward and X 10 reps lateral with bilat UE support Navigated up/down 6 inch curb step with SPC and CGA, cues and demo for technique Navigated up/down ramp with SPC and CGA to min A for balance, cues and demo for technique Navigated around computer cars placed in his way ambulating with SPC and CGA  Threx: Nu Step L6 X 6 min LE only Leg press DL 96#0 X 10, then Lt leg only 43# 2X10 Seated lumbar flexion stretch 10 sec x 10 with red ball  10/03/2022: Prosthetic Training with TTA prosthesis: Gait in bars with one UE support from Rt UE (to simulate cane) up/down X 4 trips Sidestepping in bars without UE support 3 round trips, intermittent hand touch PRN Gait with SPC with hand held assist for 10 feet progressed to CGA without hand held support 30 feet X 2, progressed to CGA and carry bottle of water 30 feet and CGA carry tray of food X 30 feet all with 90 degree turns to sit into chair with arm rests Navigated up 4 inch curb step and 6 inch curb step with SPC and CGA, cues and demo for technique Navigated up/down ramp with SPC and CGA to min A for balance, cues and demo for technique 4 square balance  with SPC min A overall for balance, 3 reps  Threx: Nu Step L6 X 5  min LE only Leg press DL 57#8 X 10, then Lt leg only 43# 2X10  09/25/2022: Prosthetic Training with TTA prosthesis: Gait with RW 75 feet X 2 with supervision Gait in bars with one UE support from Rt UE (to simulate cane) up/down X 4 trips Balance on airex pad 1 min, progressed to head turns X 10 each side and head nods X 10 Step ups onto 6 inch step leading with left leg (for strength on left) X 5 fwd with one UE support, and X 5 lateral using bilat UE support  Threx: Nu Step L5 X 6 min UE/LE Leg press DL 46N 10, then 62# X 10, then Lt leg only 31# 2X10  09/25/2022: Prosthetic Training with TTA prosthesis: Reviewed adjusting ply socks Initiated HEP at sink for balance and standing tolerance performed 10 reps of each, see HEP section for details PT demo and verbal cues on negotiating curb and ramp.  Patient negotiated with standard rolling walker with supervision for each PT demo and verbal cues for stair navigation. Had him go down/up 3 steps in cinic hall with one UE support forwards then performed another 3 steps down/up going sideways which allows him to use bilat UE support from one rail and this was easier and more safe for him at this time Sidestepping at countertop 3 round trips with minimal UE support Retrowalking at counter top 3 round trips with one UE support  09/20/2022: Prosthetic Training with TTA prosthesis: 3 areas at distal limb where he had blisters appears to have healed except epidural layer of skin.  No signs of infection.  Patient recommending to increase wear to 2 hours 3 times a day with 2 or more greater hour break between wear.  Patient verbalized understanding. PT instructed patient and wife in adjusting ply socks with limb volume changes.  Patient donned and ambulated with too few, too many and correct ply fit.  PT also recommended that when he lays the house he should take 2 #1 ply and 1 #3 ply socks to adjust as needed.  PT instructed that he may need to start his  days with greater socks over time as his legs shrinks and during the day as weightbearing pushes fluid out of his residual limb.  Patient and wife verbalized general understanding of how to adjust ply socks. Patient's wife reports she ordered a standard folding rolling walker.  PT demonstrated how to load and unload the walker if patient is trying to drive.  PT recommended that patient return to driving and low traffic situations initially.  His below-knee amputations on the left side so should not affect his driving have an automatic car.  Patient and wife verbalized understanding. PT demo and verbal cues on negotiating curb and ramp.  Patient negotiated with standard rolling walker and with rollator walker with supervision. PT demo & verbal cues on sit to / from stand from a 18" chair without armrest using UEs on seat.  Patient performed with supervision.    PATIENT EDUCATION: PATIENT EDUCATED ON FOLLOWING PROSTHETIC CARE: Education details:  Skin check, Residual limb care, Prosthetic cleaning, Correct ply sock adjustment, Propper donning, and Proper wear schedule/adjustment sit with prosthetic heel touching ground / not dangling and Prosthetic wear tolerance: 1 hours 2x/day, daily with plan to increase weekly with no skin issues.   Person educated: Patient and Spouse Education method: Explanation, Demonstration, Tactile cues, and  Verbal cues Education comprehension: verbalized understanding, returned demonstration, verbal cues required, tactile cues required, and needs further education  HOME EXERCISE PROGRAM: 09/25/22 Do each exercise 1-2  times per day Do each exercise 5-10 repetitions Hold each exercise for 2 seconds to feel your location  AT SINK FIND YOUR MIDLINE POSITION AND PLACE FEET EQUAL DISTANCE FROM THE MIDLINE.  Try to find this position when standing still for activities.   USE TAPE ON FLOOR TO MARK THE MIDLINE POSITION which is even with middle of sink.  You also should try to  feel with your limb pressure in socket.  You are trying to feel with limb what you used to feel with the bottom of your foot.  Side to Side Shift: Moving your hips only (not shoulders): move weight onto your left leg, HOLD/FEEL pressure in socket.  Move back to equal weight on each leg, HOLD/FEEL pressure in socket. Move weight onto your right leg, HOLD/FEEL pressure in socket. Move back to equal weight on each leg, HOLD/FEEL pressure in socket. Repeat.  Start with both hands on sink, progress to hand on prosthetic side only, then no hands.  Front to Back Shift: Moving your hips only (not shoulders): move your weight forward onto your toes, HOLD/FEEL pressure in socket. Move your weight back to equal Flat Foot on both legs, HOLD/FEEL  pressure in socket. Move your weight back onto your heels, HOLD/FEEL  pressure in socket. Move your weight back to equal on both legs, HOLD/FEEL  pressure in socket. Repeat.  Start with both hands on sink, progress to hand on prosthetic side only, then no hands.  Moving Cones / Cups: With equal weight on each leg: Hold on with one hand the first time, then progress to no hand supports. Move cups from one side of sink to the other. Place cups ~2" out of your reach, progress to 10" beyond reach.  Place one hand in middle of sink and reach with other hand. Do both arms.  Then hover one hand and move cups with other hand.  Overhead/Upward Reaching: alternated reaching up to top cabinets or ceiling if no cabinets present. Keep equal weight on each leg. Start with one hand support on counter while other hand reaches and progress to no hand support with reaching.  ace one hand in middle of sink and reach with other hand. Do both arms.  Then hover one hand and move cups with other hand.  5.   Looking Over Shoulders: With equal weight on each leg: alternate turning to look over your shoulders with one hand support on counter as needed.  Start with head motions only to look in front of  shoulder, then even with shoulder and progress to looking behind you. To look to side, move head /eyes, then shoulder on side looking pulls back, shift more weight to side looking and pull hip back. Place one hand in middle of sink and let go with other hand so your shoulder can pull back. Switch hands to look other way.   Then hover one hand and look over shoulder. If looking right, use left hand at sink. If looking left, use right hand at sink. 6.  Stepping with leg that is not amputated:  Move items under cabinet out of your way. Shift your hips/pelvis so weight on prosthesis. Tighten muscles in hip on prosthetic side.  SLOWLY step other leg so front of foot is in cabinet. Then step back to floor.   ASSESSMENT:  CLINICAL  IMPRESSION: He was more fatigued today which is understandable as he came yesterday to PT as well. We worked on prosthetic gait and balance as well as leg strength as tolerated. He is becoming more confident with the cane vs RW but would still use RW outside of clinic for now for safety.  OBJECTIVE IMPAIRMENTS: Abnormal gait, cardiopulmonary status limiting activity, decreased activity tolerance, decreased balance, decreased endurance, decreased knowledge of condition, decreased knowledge of use of DME, decreased mobility, difficulty walking, decreased strength, increased edema, prosthetic dependency , obesity, and pain.   ACTIVITY LIMITATIONS: carrying, lifting, bending, standing, stairs, transfers, and locomotion level  PARTICIPATION LIMITATIONS: cleaning, driving, and community activity  PERSONAL FACTORS: Fitness, Time since onset of injury/illness/exacerbation, and 3+ comorbidities: see PMH  are also affecting patient's functional outcome.   REHAB POTENTIAL: Good  CLINICAL DECISION MAKING: Evolving/moderate complexity  EVALUATION COMPLEXITY: Moderate   GOALS: Goals reviewed with patient? Yes  SHORT TERM GOALS: Target date: 10/13/2022  Patient donnes prosthesis  modified independent & verbalizes proper cleaning. Baseline: SEE OBJECTIVE DATA Goal status: ongoing 09/20/2022 2.  Patient tolerates prosthesis >6 hrs total /day without skin issues or limb pain <3/10 after standing. Baseline: SEE OBJECTIVE DATA Goal status: ongoing 09/20/2022  3.  Patient able to reach 7" and look over both shoulders without UE support with supervision. Baseline: SEE OBJECTIVE DATA Goal status: ongoing 09/20/2022  4. Patient ambulates 71' with RW & prosthesis with supervision. Baseline: SEE OBJECTIVE DATA Goal status: ongoing 09/20/2022  5. Patient negotiates ramps & curbs with RW & prosthesis with minA. Baseline: SEE OBJECTIVE DATA Goal status: ongoing 09/20/2022  [place] TERM GOALS: Target date: 11/10/2022  Patient demonstrates & verbalized understanding of prosthetic care to enable safe utilization of prosthesis. Baseline: SEE OBJECTIVE DATA Goal status: ongoing 09/20/2022  Patient tolerates prosthesis wear >90% of awake hours without skin or limb pain issues. Baseline: SEE OBJECTIVE DATA Goal status: ongoing 09/20/2022  Berg Balance score >/= 36/56 to indicate lower fall risk Baseline: SEE OBJECTIVE DATA Goal status: ongoing 09/20/2022  Patient ambulates >300' with RW / rollator walker & prosthesis modified independently Baseline: SEE OBJECTIVE DATA Goal status: ongoing 09/20/2022  Patient negotiates ramps, curbs with RW / rollator walker & prosthesis modified independently. Baseline: SEE OBJECTIVE DATA Goal status: ongoing 09/20/2022   PLAN:  PT FREQUENCY: 2x/week  PT DURATION: 8 weeks  PLANNED INTERVENTIONS: Therapeutic exercises, Therapeutic activity, Neuromuscular re-education, Balance training, Gait training, Patient/Family education, Self Care, Stair training, Vestibular training, Prosthetic training, DME instructions, and physical performance testing  PLAN FOR NEXT SESSION:  gait with SPC, continue to work on stairs and balance  April Manson, PT,  [name] 10/04/2022, 4:06 PM

## 2022-10-05 ENCOUNTER — Ambulatory Visit (INDEPENDENT_AMBULATORY_CARE_PROVIDER_SITE_OTHER): Payer: No Typology Code available for payment source | Admitting: Vascular Surgery

## 2022-10-05 ENCOUNTER — Ambulatory Visit (INDEPENDENT_AMBULATORY_CARE_PROVIDER_SITE_OTHER): Payer: No Typology Code available for payment source

## 2022-10-05 ENCOUNTER — Encounter (INDEPENDENT_AMBULATORY_CARE_PROVIDER_SITE_OTHER): Payer: No Typology Code available for payment source

## 2022-10-05 ENCOUNTER — Encounter (INDEPENDENT_AMBULATORY_CARE_PROVIDER_SITE_OTHER): Payer: Self-pay | Admitting: Vascular Surgery

## 2022-10-05 ENCOUNTER — Ambulatory Visit (INDEPENDENT_AMBULATORY_CARE_PROVIDER_SITE_OTHER): Payer: No Typology Code available for payment source | Admitting: Nurse Practitioner

## 2022-10-05 VITALS — BP 124/59 | HR 66 | Resp 16 | Wt 208.4 lb

## 2022-10-05 DIAGNOSIS — I739 Peripheral vascular disease, unspecified: Secondary | ICD-10-CM

## 2022-10-05 DIAGNOSIS — Z89512 Acquired absence of left leg below knee: Secondary | ICD-10-CM | POA: Diagnosis not present

## 2022-10-05 DIAGNOSIS — I70211 Atherosclerosis of native arteries of extremities with intermittent claudication, right leg: Secondary | ICD-10-CM

## 2022-10-05 DIAGNOSIS — E1140 Type 2 diabetes mellitus with diabetic neuropathy, unspecified: Secondary | ICD-10-CM | POA: Diagnosis not present

## 2022-10-05 DIAGNOSIS — Z9889 Other specified postprocedural states: Secondary | ICD-10-CM

## 2022-10-05 DIAGNOSIS — I25118 Atherosclerotic heart disease of native coronary artery with other forms of angina pectoris: Secondary | ICD-10-CM | POA: Diagnosis not present

## 2022-10-05 DIAGNOSIS — I10 Essential (primary) hypertension: Secondary | ICD-10-CM

## 2022-10-05 DIAGNOSIS — Z794 Long term (current) use of insulin: Secondary | ICD-10-CM

## 2022-10-05 NOTE — Progress Notes (Signed)
MRN : [id number]  [name] is a 67 y.o. (16-Nov-1955) male who presents with chief complaint of check circulation.  History of Present Illness:   The patient returns to the office for followup and review of the noninvasive studies.   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  There have been no interval changes in right lower extremity symptoms. No interval shortening of the patient's claudication distance or development of rest pain symptoms. No new ulcers or wounds have occurred since the last visit.  There have been no significant changes to the patient's overall health care.  The patient denies amaurosis fugax or recent TIA symptoms. There are no documented recent neurological changes noted. There is no history of DVT, PE or superficial thrombophlebitis. The patient denies recent episodes of angina or shortness of breath.   ABI Rt=1.16 and Lt=BKA  (previous ABI's Rt=1.01 and Lt=BKA)   No outpatient medications have been marked as taking for the 10/05/22 encounter (Appointment) with Gilda Crease, Latina Craver, MD.    Past Medical History:  Diagnosis Date   CAD (coronary artery disease)    a. 05/2022 Cath: LM nl, LAD 65m/2m, 70d, D3 90, LCX 88m, 60d, OM1 sev diff dzs, OM2 85p, OM2 subbranch sev diff dzs, RCA 70p/m, 52m.   CHF (congestive heart failure)    Chronic HFrEF (heart failure with reduced ejection fraction)    a. 05/2022 Echo: EF 25-30%, mild LVH, GrII DD, mid-apical, anteroseptal, and apical HK. Nl RV fxn. RVSP 51.40mmHg. Mildly dil LA. Mod MR/TR. Mild AS/AI.   Diabetes mellitus    Hematospermia    Hypertension    Ischemic cardiomyopathy    a. 05/2022 Echo: EF 25-30%.   Moderate Mitral regurgitation    Moderate tricuspid regurgitation    Nephrolithiasis 02/07/2007   PAD (peripheral artery disease)    a. 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   Viral meningitis     Past Surgical History:  Procedure Laterality Date   AMPUTATION Left 05/19/2022    Procedure: AMPUTATION BELOW KNEE;  Surgeon: Renford Dills, MD;  Location: [facility];  Service: Vascular;  Laterality: Left;   CHOLECYSTECTOMY  12-07-14   laparoscopic, per Dr. Rayburn Ma    LOWER EXTREMITY ANGIOGRAPHY Left 05/16/2022   Procedure: Lower Extremity Angiography;  Surgeon: Renford Dills, MD;  Location: [facility];  Service: Cardiovascular;  Laterality: Left;   RIGHT/LEFT HEART CATH AND CORONARY ANGIOGRAPHY N/A 06/02/2022   Procedure: RIGHT/LEFT HEART CATH AND CORONARY ANGIOGRAPHY;  Surgeon: Yvonne Kendall, MD;  Location: [facility];  Service: Cardiovascular;  Laterality: N/A;    Social History Social History   Tobacco Use   Smoking status: Former   Smokeless tobacco: Never   Tobacco comments:    22 yrs ago   Substance Use Topics   Alcohol use: No    Alcohol/week: 0.0 standard drinks of alcohol   Drug use: No    Family History Family History  Problem Relation Age of Onset   Rheum arthritis Mother    Diabetes Father    Heart disease Father    Cholecystitis Sister    Cholecystitis Brother    Diabetes Brother    Diabetes Brother    Parkinson's disease Brother    Heart disease Sister    Diabetes Sister    Cholecystitis Sister    Diabetes Sister    Arthritis Other    Hypertension Other  Stroke Other    Coronary artery disease Other    Birth defects Neg Hx     No Known Allergies   REVIEW OF SYSTEMS (Negative unless checked)  Constitutional: [] Weight loss  [] Fever  [] Chills Cardiac: [] Chest pain   [] Chest pressure   [] Palpitations   [] Shortness of breath when laying flat   [] Shortness of breath with exertion. Vascular:  [x] Pain in legs with walking   [] Pain in legs at rest  [] History of DVT   [] Phlebitis   [] Swelling in legs   [] Varicose veins   [] Non-healing ulcers Pulmonary:   [] Uses home oxygen   [] Productive cough   [] Hemoptysis   [] Wheeze  [] COPD   [] Asthma Neurologic:  [] Dizziness   [] Seizures   [] History of stroke    [] History of TIA  [] Aphasia   [] Vissual changes   [] Weakness or numbness in arm   [] Weakness or numbness in leg Musculoskeletal:   [] Joint swelling   [] Joint pain   [] Low back pain Hematologic:  [] Easy bruising  [] Easy bleeding   [] Hypercoagulable state   [] Anemic Gastrointestinal:  [] Diarrhea   [] Vomiting  [] Gastroesophageal reflux/heartburn   [] Difficulty swallowing. Genitourinary:  [] Chronic kidney disease   [] Difficult urination  [] Frequent urination   [] Blood in urine Skin:  [] Rashes   [] Ulcers  Psychological:  [] History of anxiety   []  History of major depression.  Physical Examination  There were no vitals filed for this visit. There is no height or weight on file to calculate BMI. Gen: WD/WN, NAD Head: [place]/AT, No temporalis wasting.  Ear/Nose/Throat: Hearing grossly intact, nares w/o erythema or drainage Eyes: PER, EOMI, sclera nonicteric.  Neck: Supple, no masses.  No bruit or JVD.  Pulmonary:  Good air movement, no audible wheezing, no use of accessory muscles.  Cardiac: RRR, normal S1, S2, no Murmurs. Vascular:  mild trophic changes, no open wounds; left BKA stump healed Vessel Right Left  Radial Palpable Palpable  PT Not Palpable BKA  DP Not Palpable BKA  Gastrointestinal: soft, non-distended. No guarding/no peritoneal signs.  Musculoskeletal: M/S 5/5 throughout.  No visible deformity.  Neurologic: CN 2-12 intact. Pain and light touch intact in extremities.  Symmetrical.  Speech is fluent. Motor exam as listed above. Psychiatric: Judgment intact, Mood & affect appropriate for pt's clinical situation. Dermatologic: No rashes or ulcers noted.  No changes consistent with cellulitis.   CBC Lab Results  Component Value Date   WBC 6.7 09/06/2022   HGB 13.9 09/06/2022   HCT 41.3 09/06/2022   MCV 94.6 09/06/2022   PLT 148.0 (L) 09/06/2022    BMET    Component Value Date/Time   NA 139 09/06/2022 0847   K 4.6 09/06/2022 0847   CL 104 09/06/2022 0847   CO2 26 09/06/2022  0847   GLUCOSE 125 (H) 09/06/2022 0847   BUN 33 (H) 09/06/2022 0847   CREATININE 1.47 09/06/2022 0847   CREATININE 1.31 (H) 03/15/2020 1700   CALCIUM 9.8 09/06/2022 0847   GFRNONAA >60 07/19/2022 1114   GFRAA >60 01/10/2020 1107   CrCl cannot be calculated (Patient's most recent lab result is older than the maximum 21 days allowed.).  COAG Lab Results  Component Value Date   INR 1.1 05/27/2022    Radiology No results found.   Assessment/Plan 1. Peripheral arterial disease with history of revascularization  Recommend:  The patient has evidence of atherosclerosis of the lower extremities with claudication.  The patient does not voice lifestyle limiting changes at this point in time.  Noninvasive studies  do not suggest clinically significant change.  No invasive studies, angiography or surgery at this time The patient should continue walking and begin a more formal exercise program.  The patient should continue antiplatelet therapy and aggressive treatment of the lipid abnormalities  No changes in the patient's medications at this time  Continued surveillance is indicated as atherosclerosis is likely to progress with time.    The patient will continue follow up with noninvasive studies as ordered.  - VAS [place] ABI WITH/WO TBI - VAS [place] ABI WITH/WO TBI; Future  2. Atherosclerosis of native artery of right lower extremity with intermittent claudication  Recommend:  The patient has evidence of atherosclerosis of the lower extremities with claudication.  The patient does not voice lifestyle limiting changes at this point in time.  Noninvasive studies do not suggest clinically significant change.  No invasive studies, angiography or surgery at this time The patient should continue walking and begin a more formal exercise program.  The patient should continue antiplatelet therapy and aggressive treatment of the lipid abnormalities  No changes in the patient's medications at  this time  Continued surveillance is indicated as atherosclerosis is likely to progress with time.    The patient will continue follow up with noninvasive studies as ordered.  - VAS [place] ABI WITH/WO TBI; Future  3. Hx of BKA, left The patient has been working very hard with physical therapy.  He has been doing very well with his prosthesis.  Indoors he is now using a cane to ambulate.  His amputation site has completely healed.  He is following with Judy Pimple regarding his prosthesis.  4. Type 2 diabetes mellitus with diabetic neuropathy, with Wery-term current use of insulin Continue hypoglycemic medications as already ordered, these medications have been reviewed and there are no changes at this time.  Hgb A1C to be monitored as already arranged by primary service  5. Coronary artery disease of native artery of native heart with stable angina pectoris Continue cardiac and antihypertensive medications as already ordered and reviewed, no changes at this time.  Continue statin as ordered and reviewed, no changes at this time  Nitrates PRN for chest pain  6. Essential hypertension Continue antihypertensive medications as already ordered, these medications have been reviewed and there are no changes at this time.     Levora Dredge, MD  10/05/2022 2:56 PM

## 2022-10-06 ENCOUNTER — Encounter (INDEPENDENT_AMBULATORY_CARE_PROVIDER_SITE_OTHER): Payer: Self-pay | Admitting: Vascular Surgery

## 2022-10-06 DIAGNOSIS — I70219 Atherosclerosis of native arteries of extremities with intermittent claudication, unspecified extremity: Secondary | ICD-10-CM | POA: Insufficient documentation

## 2022-10-10 ENCOUNTER — Ambulatory Visit (INDEPENDENT_AMBULATORY_CARE_PROVIDER_SITE_OTHER): Payer: No Typology Code available for payment source | Admitting: Physical Therapy

## 2022-10-10 ENCOUNTER — Encounter: Payer: Self-pay | Admitting: Physical Therapy

## 2022-10-10 DIAGNOSIS — L98498 Non-pressure chronic ulcer of skin of other sites with other specified severity: Secondary | ICD-10-CM | POA: Diagnosis not present

## 2022-10-10 DIAGNOSIS — R2681 Unsteadiness on feet: Secondary | ICD-10-CM

## 2022-10-10 DIAGNOSIS — R2689 Other abnormalities of gait and mobility: Secondary | ICD-10-CM | POA: Diagnosis not present

## 2022-10-10 DIAGNOSIS — M6281 Muscle weakness (generalized): Secondary | ICD-10-CM

## 2022-10-10 DIAGNOSIS — M79662 Pain in left lower leg: Secondary | ICD-10-CM

## 2022-10-10 DIAGNOSIS — Z7409 Other reduced mobility: Secondary | ICD-10-CM

## 2022-10-10 NOTE — Therapy (Signed)
OUTPATIENT PHYSICAL THERAPY PROSTHETIC TREATMENT   Patient Name: [name] MRN: [id number] DOB:10/29/1955, 67 y.o., male Today's Date: 10/10/2022  PCP: Nelwyn Salisbury, MD REFERRING PROVIDER: Sheppard Plumber, NP  END OF SESSION:        Past Medical History:  Diagnosis Date   CAD (coronary artery disease)    a. 05/2022 Cath: LM nl, LAD 65m/59m, 70d, D3 90, LCX 5m, 60d, OM1 sev diff dzs, OM2 85p, OM2 subbranch sev diff dzs, RCA 70p/m, 61m.   CHF (congestive heart failure)    Chronic HFrEF (heart failure with reduced ejection fraction)    a. 05/2022 Echo: EF 25-30%, mild LVH, GrII DD, mid-apical, anteroseptal, and apical HK. Nl RV fxn. RVSP 51.57mmHg. Mildly dil LA. Mod MR/TR. Mild AS/AI.   Diabetes mellitus    Hematospermia    Hypertension    Ischemic cardiomyopathy    a. 05/2022 Echo: EF 25-30%.   Moderate Mitral regurgitation    Moderate tricuspid regurgitation    Nephrolithiasis 02/07/2007   PAD (peripheral artery disease)    a. 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   Viral meningitis    Past Surgical History:  Procedure Laterality Date   AMPUTATION Left 05/19/2022   Procedure: AMPUTATION BELOW KNEE;  Surgeon: Renford Dills, MD;  Location: [facility];  Service: Vascular;  Laterality: Left;   CHOLECYSTECTOMY  12-07-14   laparoscopic, per Dr. Rayburn Ma    LOWER EXTREMITY ANGIOGRAPHY Left 05/16/2022   Procedure: Lower Extremity Angiography;  Surgeon: Renford Dills, MD;  Location: [facility];  Service: Cardiovascular;  Laterality: Left;   RIGHT/LEFT HEART CATH AND CORONARY ANGIOGRAPHY N/A 06/02/2022   Procedure: RIGHT/LEFT HEART CATH AND CORONARY ANGIOGRAPHY;  Surgeon: Yvonne Kendall, MD;  Location: [facility];  Service: Cardiovascular;  Laterality: N/A;   Patient Active Problem List   Diagnosis Date Noted   Atherosclerosis of native arteries of extremity with intermittent claudication 10/06/2022   Pneumonia of both upper lobes due to infectious organism  06/14/2022   Dilated cardiomyopathy 06/04/2022   Coronary artery disease of native artery of native heart with stable angina pectoris 06/03/2022   Ischemic cardiomyopathy 06/03/2022   PAD (peripheral artery disease) 06/02/2022   Diabetes mellitus type 2 with complications 06/02/2022   Elevated troponin 06/02/2022   HFrEF (heart failure with reduced ejection fraction) 05/31/2022   Demand ischemia 05/27/2022   Controlled type 2 diabetes mellitus with hyperglycemia, with Mcmackin-term current use of insulin 05/27/2022   Anemia 05/27/2022   Acute on chronic HFrEF (heart failure with reduced ejection fraction) 05/27/2022   Hx of BKA, left 05/27/2022   Open displaced avulsion fracture of tuberosity of left calcaneus 05/23/2022   AMS (altered mental status) 05/21/2022   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 due to underlying condition 05/17/2022   Hypertensive urgency 05/16/2022   Gangrene of left foot 05/16/2022   Pyogenic inflammation of bone 05/16/2022   Diabetic retinopathy associated with type 2 diabetes mellitus 01/02/2022   Hypothyroidism 08/23/2021   Type 2 diabetes mellitus with diabetic neuropathy, unspecified 04/16/2020   Cataracts, bilateral 04/16/2020   Sinus tachycardia 01/14/2020   Insomnia 01/07/2020   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 2 diabetes mellitus 07/27/2017   Generalized anxiety disorder 07/27/2017   Chronic right flank pain 12/20/2015   Hyperkalemia 10/21/2014   Chest pain 09/21/2014   ADHD 06/22/2010   ERECTILE DYSFUNCTION, ORGANIC 06/22/2010   MORBID OBESITY 08/04/2008   NEPHROLITHIASIS, HX OF 03/22/2007   Essential hypertension 02/22/2007  ONSET DATE: prosthesis delivery 08/29/2022  REFERRING DIAG: Z89.519 (ICD-10-CM) - Hx of BKA   THERAPY DIAG:  No diagnosis found.  Rationale for Evaluation and Treatment: Rehabilitation  SUBJECTIVE:   SUBJECTIVE STATEMENT: He relays not having much pain overall, he will see prosthetist tomorrow. He  wants to know where he can get a quad tip cane like we have in PT.   PERTINENT HISTORY: HTN, IDDM2, diabetic neuropathy, PVD, hypothyroidism, viral meningitis  PAIN:  Are you having pain? No   PRECAUTIONS: Fall  WEIGHT BEARING RESTRICTIONS: No  FALLS: Has patient fallen in last 6 months? No  LIVING ENVIRONMENT: Lives with: lives with their spouse Lives in: House  Home Access: Ramped entrance primary or option of stairs Home layout: One level Stairs: Yes: External: 4-5 steps; bilateral but cannot reach both Has following equipment at home: Single point cane, Walker - 4 wheeled, Wheelchair (manual), Shower bench, bed side commode, Grab bars, and Ramped entry  PLOF: Independent, Independent with household mobility with device, and Independent with community mobility with device    PATIENT GOALS:  to use prosthesis to be mobile to enable most appropriate cardiac surgery.   OBJECTIVE:  COGNITION: Overall cognitive status: Within functional limits for tasks assessed  POSTURE: rounded shoulders, forward head, flexed trunk , and weight shift right  LOWER EXTREMITY ROM:  ROM P:passive  A:active Right eval Left eval  Hip flexion    Hip extension    Hip abduction    Hip adduction    Hip internal rotation    Hip external rotation    Knee flexion    Knee extension    Ankle dorsiflexion    Ankle plantarflexion    Ankle inversion    Ankle eversion     (Blank rows = not tested)  LOWER EXTREMITY MMT:  MMT Right eval Left eval  Hip flexion    Hip extension    Hip abduction    Hip adduction    Hip internal rotation    Hip external rotation    Knee flexion    Knee extension    Ankle dorsiflexion    Ankle plantarflexion    Ankle inversion    Ankle eversion    (Blank rows = not tested)  TRANSFERS: Sit to stand: SBA 22" w/c using armrests using back of legs against w/c to stabilize and touch on wall to left.   Stand to sit: SBA RW to 22" w/c using armrests    GAIT: Distance walked: 25' Assistive device utilized: Environmental consultant - 2 wheeled and left TTA prosthesis Level of assistance: Min A Gait pattern: step to pattern, decreased step length- Right, decreased stance time- Left, decreased hip/knee flexion- Left, Left hip hike, knee flexed in stance- Left, antalgic, lateral hip instability, trunk flexed, and abducted- Left Comments: excessive BUE weight bearing.   FUNCTIONAL TESTs:  Sharlene Motts Balance Scale: 14/56   CARDIOVASCULAR RESPONSE: Functional activity: gait & standing balance Pre-activity vitals: HR: 65 SpO2: 100% Post-activity vitals: HR: 77 SpO2: 96%  CURRENT PROSTHETIC WEAR ASSESSMENT: Evaluation / 09/12/2022:  Patient is dependent with: skin check, residual limb care, care of non-amputated limb, prosthetic cleaning, ply sock cleaning, correct ply sock adjustment, proper wear schedule/adjustment, and proper weight-bearing schedule/adjustment Donning prosthesis: Min A Doffing prosthesis: SBA Prosthetic wear tolerance: 1 hours, 2x/day, 3 days for first 3 of 7 days since delivery but stopped due to skin issue.  Prosthetic weight bearing tolerance: 5 minutes PWB LLE with prosthesis limb pain 5/10 Edema: pitting Residual limb condition:  water blisters 17mm x 3 mm distal limb,  pt & wife report incision at distal tibia cracked open with prosthesis wear for first 3 days.  No hair growth, shiny skin, pale color, normal temperature & moisture.  Cylindrical shape.  Prosthetic description: silicon liner with pin lock, secondary interface pelite liner, flexible keel foot.     TODAY'S TREATMENT:                                                                                                                             DATE:  10/10/2022: Prosthetic Training with TTA prosthesis: Showed him where to get quad tip cane on [email or web address] Discussed feel and signs for proper fit of prosthesis and adjusting socks Gait with SPC 30 feet X 4 with head turns and  CGA Gait with SPC 30 feet X 4 with head nods and CGA Navigated up/down 6 inch curb step with SPC and CGA Navigated up/down ramp with SPC and CGA cues for technique  Threx:  Leg press DL 16#1 X 10, then Lt leg only 56# 3X10  10/04/2022: Prosthetic Training with TTA prosthesis: Gait in bars with bilat fingertip support up/down X3 trips Sidestepping in bars without UE support 4 round trips, intermittent hand touch PRN Gait with SPC 140 feet CGA, 100 feet CGA Step ups leading with left leg for strengthening X 10 reps forward and X 10 reps lateral with bilat UE support Navigated up/down 6 inch curb step with SPC and CGA, cues and demo for technique Navigated up/down ramp with SPC and CGA to min A for balance, cues and demo for technique Navigated around computer cars placed in his way ambulating with SPC and CGA  Threx: Nu Step L6 X 6 min LE only Leg press DL 09#6 X 10, then Lt leg only 43# 2X10 Seated lumbar flexion stretch 10 sec x 10 with red ball  10/03/2022: Prosthetic Training with TTA prosthesis: Gait in bars with one UE support from Rt UE (to simulate cane) up/down X 4 trips Sidestepping in bars without UE support 3 round trips, intermittent hand touch PRN Gait with SPC with hand held assist for 10 feet progressed to CGA without hand held support 30 feet X 2, progressed to CGA and carry bottle of water 30 feet and CGA carry tray of food X 30 feet all with 90 degree turns to sit into chair with arm rests Navigated up 4 inch curb step and 6 inch curb step with SPC and CGA, cues and demo for technique Navigated up/down ramp with SPC and CGA to min A for balance, cues and demo for technique 4 square balance with SPC min A overall for balance, 3 reps  Threx: Nu Step L6 X 5 min LE only Leg press DL 04#5 X 10, then Lt leg only 43# 2X10  09/25/2022: Prosthetic Training with TTA prosthesis: Gait with RW 75 feet X 2 with supervision Gait in bars with one UE support from Rt UE (  to  simulate cane) up/down X 4 trips Balance on airex pad 1 min, progressed to head turns X 10 each side and head nods X 10 Step ups onto 6 inch step leading with left leg (for strength on left) X 5 fwd with one UE support, and X 5 lateral using bilat UE support  Threx: Nu Step L5 X 6 min UE/LE Leg press DL 16X 10, then 09# X 10, then Lt leg only 31# 2X10  09/25/2022: Prosthetic Training with TTA prosthesis: Reviewed adjusting ply socks Initiated HEP at sink for balance and standing tolerance performed 10 reps of each, see HEP section for details PT demo and verbal cues on negotiating curb and ramp.  Patient negotiated with standard rolling walker with supervision for each PT demo and verbal cues for stair navigation. Had him go down/up 3 steps in cinic hall with one UE support forwards then performed another 3 steps down/up going sideways which allows him to use bilat UE support from one rail and this was easier and more safe for him at this time Sidestepping at countertop 3 round trips with minimal UE support Retrowalking at counter top 3 round trips with one UE support  09/20/2022: Prosthetic Training with TTA prosthesis: 3 areas at distal limb where he had blisters appears to have healed except epidural layer of skin.  No signs of infection.  Patient recommending to increase wear to 2 hours 3 times a day with 2 or more greater hour break between wear.  Patient verbalized understanding. PT instructed patient and wife in adjusting ply socks with limb volume changes.  Patient donned and ambulated with too few, too many and correct ply fit.  PT also recommended that when he lays the house he should take 2 #1 ply and 1 #3 ply socks to adjust as needed.  PT instructed that he may need to start his days with greater socks over time as his legs shrinks and during the day as weightbearing pushes fluid out of his residual limb.  Patient and wife verbalized general understanding of how to adjust ply  socks. Patient's wife reports she ordered a standard folding rolling walker.  PT demonstrated how to load and unload the walker if patient is trying to drive.  PT recommended that patient return to driving and low traffic situations initially.  His below-knee amputations on the left side so should not affect his driving have an automatic car.  Patient and wife verbalized understanding. PT demo and verbal cues on negotiating curb and ramp.  Patient negotiated with standard rolling walker and with rollator walker with supervision. PT demo & verbal cues on sit to / from stand from a 18" chair without armrest using UEs on seat.  Patient performed with supervision.    PATIENT EDUCATION: PATIENT EDUCATED ON FOLLOWING PROSTHETIC CARE: Education details:  Skin check, Residual limb care, Prosthetic cleaning, Correct ply sock adjustment, Propper donning, and Proper wear schedule/adjustment sit with prosthetic heel touching ground / not dangling and Prosthetic wear tolerance: 1 hours 2x/day, daily with plan to increase weekly with no skin issues.   Person educated: Patient and Spouse Education method: Explanation, Demonstration, Tactile cues, and Verbal cues Education comprehension: verbalized understanding, returned demonstration, verbal cues required, tactile cues required, and needs further education  HOME EXERCISE PROGRAM: 09/25/22 Do each exercise 1-2  times per day Do each exercise 5-10 repetitions Hold each exercise for 2 seconds to feel your location  AT SINK FIND YOUR MIDLINE POSITION AND PLACE FEET EQUAL  DISTANCE FROM THE MIDLINE.  Try to find this position when standing still for activities.   USE TAPE ON FLOOR TO MARK THE MIDLINE POSITION which is even with middle of sink.  You also should try to feel with your limb pressure in socket.  You are trying to feel with limb what you used to feel with the bottom of your foot.  Side to Side Shift: Moving your hips only (not shoulders): move weight  onto your left leg, HOLD/FEEL pressure in socket.  Move back to equal weight on each leg, HOLD/FEEL pressure in socket. Move weight onto your right leg, HOLD/FEEL pressure in socket. Move back to equal weight on each leg, HOLD/FEEL pressure in socket. Repeat.  Start with both hands on sink, progress to hand on prosthetic side only, then no hands.  Front to Back Shift: Moving your hips only (not shoulders): move your weight forward onto your toes, HOLD/FEEL pressure in socket. Move your weight back to equal Flat Foot on both legs, HOLD/FEEL  pressure in socket. Move your weight back onto your heels, HOLD/FEEL  pressure in socket. Move your weight back to equal on both legs, HOLD/FEEL  pressure in socket. Repeat.  Start with both hands on sink, progress to hand on prosthetic side only, then no hands.  Moving Cones / Cups: With equal weight on each leg: Hold on with one hand the first time, then progress to no hand supports. Move cups from one side of sink to the other. Place cups ~2" out of your reach, progress to 10" beyond reach.  Place one hand in middle of sink and reach with other hand. Do both arms.  Then hover one hand and move cups with other hand.  Overhead/Upward Reaching: alternated reaching up to top cabinets or ceiling if no cabinets present. Keep equal weight on each leg. Start with one hand support on counter while other hand reaches and progress to no hand support with reaching.  ace one hand in middle of sink and reach with other hand. Do both arms.  Then hover one hand and move cups with other hand.  5.   Looking Over Shoulders: With equal weight on each leg: alternate turning to look over your shoulders with one hand support on counter as needed.  Start with head motions only to look in front of shoulder, then even with shoulder and progress to looking behind you. To look to side, move head /eyes, then shoulder on side looking pulls back, shift more weight to side looking and pull hip back.  Place one hand in middle of sink and let go with other hand so your shoulder can pull back. Switch hands to look other way.   Then hover one hand and look over shoulder. If looking right, use left hand at sink. If looking left, use right hand at sink. 6.  Stepping with leg that is not amputated:  Move items under cabinet out of your way. Shift your hips/pelvis so weight on prosthesis. Tighten muscles in hip on prosthetic side.  SLOWLY step other leg so front of foot is in cabinet. Then step back to floor.   ASSESSMENT:  CLINICAL IMPRESSION: He is overall progressing well and I gave him more dynamic balance challenges during ambulation with SPC. We reviewed proper fitting for his prosthesis and he will see prosthetist tomorrow as well,   OBJECTIVE IMPAIRMENTS: Abnormal gait, cardiopulmonary status limiting activity, decreased activity tolerance, decreased balance, decreased endurance, decreased knowledge of condition, decreased knowledge of  use of DME, decreased mobility, difficulty walking, decreased strength, increased edema, prosthetic dependency , obesity, and pain.   ACTIVITY LIMITATIONS: carrying, lifting, bending, standing, stairs, transfers, and locomotion level  PARTICIPATION LIMITATIONS: cleaning, driving, and community activity  PERSONAL FACTORS: Fitness, Time since onset of injury/illness/exacerbation, and 3+ comorbidities: see PMH  are also affecting patient's functional outcome.   REHAB POTENTIAL: Good  CLINICAL DECISION MAKING: Evolving/moderate complexity  EVALUATION COMPLEXITY: Moderate   GOALS: Goals reviewed with patient? Yes  SHORT TERM GOALS: Target date: 10/13/2022  Patient donnes prosthesis modified independent & verbalizes proper cleaning. Baseline: SEE OBJECTIVE DATA Goal status: ongoing 09/20/2022 2.  Patient tolerates prosthesis >6 hrs total /day without skin issues or limb pain <3/10 after standing. Baseline: SEE OBJECTIVE DATA Goal status: ongoing  09/20/2022  3.  Patient able to reach 7" and look over both shoulders without UE support with supervision. Baseline: SEE OBJECTIVE DATA Goal status: ongoing 09/20/2022  4. Patient ambulates 29' with RW & prosthesis with supervision. Baseline: SEE OBJECTIVE DATA Goal status: ongoing 09/20/2022  5. Patient negotiates ramps & curbs with RW & prosthesis with minA. Baseline: SEE OBJECTIVE DATA Goal status: ongoing 09/20/2022  [place] TERM GOALS: Target date: 11/10/2022  Patient demonstrates & verbalized understanding of prosthetic care to enable safe utilization of prosthesis. Baseline: SEE OBJECTIVE DATA Goal status: ongoing 09/20/2022  Patient tolerates prosthesis wear >90% of awake hours without skin or limb pain issues. Baseline: SEE OBJECTIVE DATA Goal status: ongoing 09/20/2022  Berg Balance score >/= 36/56 to indicate lower fall risk Baseline: SEE OBJECTIVE DATA Goal status: ongoing 09/20/2022  Patient ambulates >300' with RW / rollator walker & prosthesis modified independently Baseline: SEE OBJECTIVE DATA Goal status: ongoing 09/20/2022  Patient negotiates ramps, curbs with RW / rollator walker & prosthesis modified independently. Baseline: SEE OBJECTIVE DATA Goal status: ongoing 09/20/2022   PLAN:  PT FREQUENCY: 2x/week  PT DURATION: 8 weeks  PLANNED INTERVENTIONS: Therapeutic exercises, Therapeutic activity, Neuromuscular re-education, Balance training, Gait training, Patient/Family education, Self Care, Stair training, Vestibular training, Prosthetic training, DME instructions, and physical performance testing  PLAN FOR NEXT SESSION:  gait with SPC, continue to work on stairs and balance  April Manson, PT, [name] 10/10/2022, 4:18 PM

## 2022-10-12 ENCOUNTER — Ambulatory Visit: Payer: No Typology Code available for payment source | Admitting: Physical Therapy

## 2022-10-12 ENCOUNTER — Telehealth (HOSPITAL_COMMUNITY): Payer: Self-pay | Admitting: Emergency Medicine

## 2022-10-12 ENCOUNTER — Telehealth (HOSPITAL_COMMUNITY): Payer: Self-pay | Admitting: *Deleted

## 2022-10-12 ENCOUNTER — Encounter: Payer: Self-pay | Admitting: Physical Therapy

## 2022-10-12 DIAGNOSIS — L98498 Non-pressure chronic ulcer of skin of other sites with other specified severity: Secondary | ICD-10-CM | POA: Diagnosis not present

## 2022-10-12 DIAGNOSIS — M79662 Pain in left lower leg: Secondary | ICD-10-CM | POA: Diagnosis not present

## 2022-10-12 DIAGNOSIS — R2689 Other abnormalities of gait and mobility: Secondary | ICD-10-CM | POA: Diagnosis not present

## 2022-10-12 DIAGNOSIS — R2681 Unsteadiness on feet: Secondary | ICD-10-CM | POA: Diagnosis not present

## 2022-10-12 DIAGNOSIS — M6281 Muscle weakness (generalized): Secondary | ICD-10-CM

## 2022-10-12 DIAGNOSIS — Z7409 Other reduced mobility: Secondary | ICD-10-CM

## 2022-10-12 NOTE — Telephone Encounter (Signed)
Patient returning call regarding upcoming cardiac imaging study; pt verbalizes understanding of appt date/time, parking situation and where to check in, and verified current allergies; name and call back number provided for further questions should they arise  Larey Brick RN Navigator Cardiac Imaging [facility] [phone] office [phone] cell  Patient denies internal metal or claustrophobia.

## 2022-10-12 NOTE — Telephone Encounter (Signed)
Attempted to call patient regarding upcoming cardiac MR appointment. Left message on voicemail with name and callback number Farron Lafond RN Navigator Cardiac Imaging [facility] [phone] Office [phone] Cell  

## 2022-10-12 NOTE — Therapy (Signed)
OUTPATIENT PHYSICAL THERAPY PROSTHETIC TREATMENT   Patient Name: [name] MRN: [id number] DOB:April 10, 1956, 67 y.o., male Today's Date: 10/12/2022  PCP: Nelwyn Salisbury, MD REFERRING PROVIDER: Sheppard Plumber, NP  END OF SESSION:  PT End of Session - 10/12/22 1453     Visit Number 9    Number of Visits 17    Date for PT Re-Evaluation 11/10/22    Authorization Type [employer]    Authorization Time Period 20% co-insurance, 89 PT visits remaining    Authorization - Number of Visits 89    Progress Note Due on Visit 10    PT Start Time 1510    PT Stop Time 1550    PT Time Calculation (min) 40 min    Equipment Utilized During Treatment Gait belt    Activity Tolerance Patient tolerated treatment well    Behavior During Therapy WFL for tasks assessed/performed                  Past Medical History:  Diagnosis Date   CAD (coronary artery disease)    a. 05/2022 Cath: LM nl, LAD 2m/22m, 70d, D3 90, LCX 5m, 60d, OM1 sev diff dzs, OM2 85p, OM2 subbranch sev diff dzs, RCA 70p/m, 105m.   CHF (congestive heart failure)    Chronic HFrEF (heart failure with reduced ejection fraction)    a. 05/2022 Echo: EF 25-30%, mild LVH, GrII DD, mid-apical, anteroseptal, and apical HK. Nl RV fxn. RVSP 51.59mmHg. Mildly dil LA. Mod MR/TR. Mild AS/AI.   Diabetes mellitus    Hematospermia    Hypertension    Ischemic cardiomyopathy    a. 05/2022 Echo: EF 25-30%.   Moderate Mitral regurgitation    Moderate tricuspid regurgitation    Nephrolithiasis 02/07/2007   PAD (peripheral artery disease)    a. 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   Viral meningitis    Past Surgical History:  Procedure Laterality Date   AMPUTATION Left 05/19/2022   Procedure: AMPUTATION BELOW KNEE;  Surgeon: Renford Dills, MD;  Location: [facility];  Service: Vascular;  Laterality: Left;   CHOLECYSTECTOMY  12-07-14   laparoscopic, per Dr. Rayburn Ma    LOWER EXTREMITY ANGIOGRAPHY Left 05/16/2022   Procedure: Lower Extremity Angiography;   Surgeon: Renford Dills, MD;  Location: [facility];  Service: Cardiovascular;  Laterality: Left;   RIGHT/LEFT HEART CATH AND CORONARY ANGIOGRAPHY N/A 06/02/2022   Procedure: RIGHT/LEFT HEART CATH AND CORONARY ANGIOGRAPHY;  Surgeon: Yvonne Kendall, MD;  Location: [facility];  Service: Cardiovascular;  Laterality: N/A;   Patient Active Problem List   Diagnosis Date Noted   Atherosclerosis of native arteries of extremity with intermittent claudication 10/06/2022   Pneumonia of both upper lobes due to infectious organism 06/14/2022   Dilated cardiomyopathy 06/04/2022   Coronary artery disease of native artery of native heart with stable angina pectoris 06/03/2022   Ischemic cardiomyopathy 06/03/2022   PAD (peripheral artery disease) 06/02/2022   Diabetes mellitus type 2 with complications 06/02/2022   Elevated troponin 06/02/2022   HFrEF (heart failure with reduced ejection fraction) 05/31/2022   Demand ischemia 05/27/2022   Controlled type 2 diabetes mellitus with hyperglycemia, with Sailer-term current use of insulin 05/27/2022   Anemia 05/27/2022   Acute on chronic HFrEF (heart failure with reduced ejection fraction) 05/27/2022   Hx of BKA, left 05/27/2022   Open displaced avulsion fracture of tuberosity of left calcaneus 05/23/2022   AMS (altered mental status) 05/21/2022   Diabetic ulcer of left heel associated with  diabetes mellitus due to underlying condition 05/17/2022   Hypertensive urgency 05/16/2022   Gangrene of left foot 05/16/2022   Pyogenic inflammation of bone 05/16/2022   Diabetic retinopathy associated with type 2 diabetes mellitus 01/02/2022   Hypothyroidism 08/23/2021   Type 2 diabetes mellitus with diabetic neuropathy, unspecified 04/16/2020   Cataracts, bilateral 04/16/2020   Sinus tachycardia 01/14/2020   Insomnia 01/07/2020   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 2 diabetes mellitus 07/27/2017   Generalized anxiety disorder 07/27/2017   Chronic right  flank pain 12/20/2015   Hyperkalemia 10/21/2014   Chest pain 09/21/2014   ADHD 06/22/2010   ERECTILE DYSFUNCTION, ORGANIC 06/22/2010   MORBID OBESITY 08/04/2008   NEPHROLITHIASIS, HX OF 03/22/2007   Essential hypertension 02/22/2007    ONSET DATE: prosthesis delivery 08/29/2022  REFERRING DIAG: Z89.519 (ICD-10-CM) - Hx of BKA   THERAPY DIAG:  Other abnormalities of gait and mobility  Unsteadiness on feet  Non-pressure chronic ulcer of skin of other sites with other specified severity  Pain in left lower leg  Impaired functional mobility and activity tolerance  Muscle weakness (generalized)  Rationale for Evaluation and Treatment: Rehabilitation  SUBJECTIVE:   SUBJECTIVE STATEMENT: He denies pain, he has ordered cane with quad tip for home.   PERTINENT HISTORY: HTN, IDDM2, diabetic neuropathy, PVD, hypothyroidism, viral meningitis  PAIN:  Are you having pain? No   PRECAUTIONS: Fall  WEIGHT BEARING RESTRICTIONS: No  FALLS: Has patient fallen in last 6 months? No  LIVING ENVIRONMENT: Lives with: lives with their spouse Lives in: House  Home Access: Ramped entrance primary or option of stairs Home layout: One level Stairs: Yes: External: 4-5 steps; bilateral but cannot reach both Has following equipment at home: Single point cane, Walker - 4 wheeled, Wheelchair (manual), Shower bench, bed side commode, Grab bars, and Ramped entry  PLOF: Independent, Independent with household mobility with device, and Independent with community mobility with device    PATIENT GOALS:  to use prosthesis to be mobile to enable most appropriate cardiac surgery.   OBJECTIVE:  COGNITION: Overall cognitive status: Within functional limits for tasks assessed  POSTURE: rounded shoulders, forward head, flexed trunk , and weight shift right  LOWER EXTREMITY ROM:  ROM P:passive  A:active Right eval Left eval  Hip flexion    Hip extension    Hip abduction    Hip adduction     Hip internal rotation    Hip external rotation    Knee flexion    Knee extension    Ankle dorsiflexion    Ankle plantarflexion    Ankle inversion    Ankle eversion     (Blank rows = not tested)  LOWER EXTREMITY MMT:  MMT Right eval Left eval  Hip flexion    Hip extension    Hip abduction    Hip adduction    Hip internal rotation    Hip external rotation    Knee flexion    Knee extension    Ankle dorsiflexion    Ankle plantarflexion    Ankle inversion    Ankle eversion    (Blank rows = not tested)  TRANSFERS: Sit to stand: SBA 22" w/c using armrests using back of legs against w/c to stabilize and touch on wall to left.   Stand to sit: SBA RW to 22" w/c using armrests   GAIT: Distance walked: 25' Assistive device utilized: Environmental consultant - 2 wheeled and left TTA prosthesis Level of assistance: Min A Gait pattern: step to pattern, decreased step length- Right,  decreased stance time- Left, decreased hip/knee flexion- Left, Left hip hike, knee flexed in stance- Left, antalgic, lateral hip instability, trunk flexed, and abducted- Left Comments: excessive BUE weight bearing.   FUNCTIONAL TESTs:  Sharlene Motts Balance Scale: 14/56   CARDIOVASCULAR RESPONSE: Functional activity: gait & standing balance Pre-activity vitals: HR: 65 SpO2: 100% Post-activity vitals: HR: 77 SpO2: 96%  CURRENT PROSTHETIC WEAR ASSESSMENT: Evaluation / 09/12/2022:  Patient is dependent with: skin check, residual limb care, care of non-amputated limb, prosthetic cleaning, ply sock cleaning, correct ply sock adjustment, proper wear schedule/adjustment, and proper weight-bearing schedule/adjustment Donning prosthesis: Min A Doffing prosthesis: SBA Prosthetic wear tolerance: 1 hours, 2x/day, 3 days for first 3 of 7 days since delivery but stopped due to skin issue.  Prosthetic weight bearing tolerance: 5 minutes PWB LLE with prosthesis limb pain 5/10 Edema: pitting Residual limb condition: water blisters 17mm  x 3 mm distal limb,  pt & wife report incision at distal tibia cracked open with prosthesis wear for first 3 days.  No hair growth, shiny skin, pale color, normal temperature & moisture.  Cylindrical shape.  Prosthetic description: silicon liner with pin lock, secondary interface pelite liner, flexible keel foot.     TODAY'S TREATMENT:                                                                                                                             DATE:  10/12/2022: Prosthetic Training with TTA prosthesis: Gait up/down one flight of stairs with SPC and one one handrail, supervision Gait stepping over ankle weights as obstacles 6 in total X 6 reps with CGA Picking up bottle off floor and placing back down with cues and demo for technique X 5 Gait with SPC 30 feet X 4 with head turns and CGA Gait with SPC 30 feet X 4 with head nods and CGA Navigated up/down 6 inch curb step with SPC and CGA Navigated up/down ramp with SPC and CGA cues for technique Walking at countertop without UE support with hand hover over counter forward walking 3 round trips and sidestepping 3 round trips. Standing alternating UE rows blue X 15 with CGA Standing shoulder extensions blue X 15 bilat with CGA Standing alternating chest press with blue X 15 with CGA  Threx: Nu Step L7 (first 4 min) then L6 last 2 min for 6 min total, seat #8, LE only   10/10/2022: Prosthetic Training with TTA prosthesis: Showed him where to get quad tip cane on [email or web address] Discussed feel and signs for proper fit of prosthesis and adjusting socks Gait with SPC 30 feet X 4 with head turns and CGA Gait with SPC 30 feet X 4 with head nods and CGA Navigated up/down 6 inch curb step with SPC and CGA Navigated up/down ramp with SPC and CGA cues for technique   Threx:  Leg press DL 16#1 X 10, then Lt leg only 56# 3X10  10/04/2022: Prosthetic Training with TTA prosthesis: Gait in  bars with bilat fingertip support up/down X3  trips Sidestepping in bars without UE support 4 round trips, intermittent hand touch PRN Gait with SPC 140 feet CGA, 100 feet CGA Step ups leading with left leg for strengthening X 10 reps forward and X 10 reps lateral with bilat UE support Navigated up/down 6 inch curb step with SPC and CGA, cues and demo for technique Navigated up/down ramp with SPC and CGA to min A for balance, cues and demo for technique Navigated around computer cars placed in his way ambulating with SPC and CGA  Threx: Nu Step L6 X 6 min LE only Leg press DL 09#8 X 10, then Lt leg only 43# 2X10 Seated lumbar flexion stretch 10 sec x 10 with red ball    PATIENT EDUCATION: PATIENT EDUCATED ON FOLLOWING PROSTHETIC CARE: Education details:  Skin check, Residual limb care, Prosthetic cleaning, Correct ply sock adjustment, Propper donning, and Proper wear schedule/adjustment sit with prosthetic heel touching ground / not dangling and Prosthetic wear tolerance: 1 hours 2x/day, daily with plan to increase weekly with no skin issues.   Person educated: Patient and Spouse Education method: Explanation, Demonstration, Tactile cues, and Verbal cues Education comprehension: verbalized understanding, returned demonstration, verbal cues required, tactile cues required, and needs further education  HOME EXERCISE PROGRAM: 09/25/22 Do each exercise 1-2  times per day Do each exercise 5-10 repetitions Hold each exercise for 2 seconds to feel your location  AT SINK FIND YOUR MIDLINE POSITION AND PLACE FEET EQUAL DISTANCE FROM THE MIDLINE.  Try to find this position when standing still for activities.   USE TAPE ON FLOOR TO MARK THE MIDLINE POSITION which is even with middle of sink.  You also should try to feel with your limb pressure in socket.  You are trying to feel with limb what you used to feel with the bottom of your foot.  Side to Side Shift: Moving your hips only (not shoulders): move weight onto your left leg, HOLD/FEEL  pressure in socket.  Move back to equal weight on each leg, HOLD/FEEL pressure in socket. Move weight onto your right leg, HOLD/FEEL pressure in socket. Move back to equal weight on each leg, HOLD/FEEL pressure in socket. Repeat.  Start with both hands on sink, progress to hand on prosthetic side only, then no hands.  Front to Back Shift: Moving your hips only (not shoulders): move your weight forward onto your toes, HOLD/FEEL pressure in socket. Move your weight back to equal Flat Foot on both legs, HOLD/FEEL  pressure in socket. Move your weight back onto your heels, HOLD/FEEL  pressure in socket. Move your weight back to equal on both legs, HOLD/FEEL  pressure in socket. Repeat.  Start with both hands on sink, progress to hand on prosthetic side only, then no hands.  Moving Cones / Cups: With equal weight on each leg: Hold on with one hand the first time, then progress to no hand supports. Move cups from one side of sink to the other. Place cups ~2" out of your reach, progress to 10" beyond reach.  Place one hand in middle of sink and reach with other hand. Do both arms.  Then hover one hand and move cups with other hand.  Overhead/Upward Reaching: alternated reaching up to top cabinets or ceiling if no cabinets present. Keep equal weight on each leg. Start with one hand support on counter while other hand reaches and progress to no hand support with reaching.  ace one hand in middle  of sink and reach with other hand. Do both arms.  Then hover one hand and move cups with other hand.  5.   Looking Over Shoulders: With equal weight on each leg: alternate turning to look over your shoulders with one hand support on counter as needed.  Start with head motions only to look in front of shoulder, then even with shoulder and progress to looking behind you. To look to side, move head /eyes, then shoulder on side looking pulls back, shift more weight to side looking and pull hip back. Place one hand in middle of sink  and let go with other hand so your shoulder can pull back. Switch hands to look other way.   Then hover one hand and look over shoulder. If looking right, use left hand at sink. If looking left, use right hand at sink. 6.  Stepping with leg that is not amputated:  Move items under cabinet out of your way. Shift your hips/pelvis so weight on prosthesis. Tighten muscles in hip on prosthetic side.  SLOWLY step other leg so front of foot is in cabinet. Then step back to floor.   ASSESSMENT:  CLINICAL IMPRESSION: He had good tolerance to exercise progressions focusing on gait with SPC. He does still get fatigued and off balance at times so PT recommending to continue with PT.  OBJECTIVE IMPAIRMENTS: Abnormal gait, cardiopulmonary status limiting activity, decreased activity tolerance, decreased balance, decreased endurance, decreased knowledge of condition, decreased knowledge of use of DME, decreased mobility, difficulty walking, decreased strength, increased edema, prosthetic dependency , obesity, and pain.   ACTIVITY LIMITATIONS: carrying, lifting, bending, standing, stairs, transfers, and locomotion level  PARTICIPATION LIMITATIONS: cleaning, driving, and community activity  PERSONAL FACTORS: Fitness, Time since onset of injury/illness/exacerbation, and 3+ comorbidities: see PMH  are also affecting patient's functional outcome.   REHAB POTENTIAL: Good  CLINICAL DECISION MAKING: Evolving/moderate complexity  EVALUATION COMPLEXITY: Moderate   GOALS: Goals reviewed with patient? Yes  SHORT TERM GOALS: Target date: 10/13/2022  Patient donnes prosthesis modified independent & verbalizes proper cleaning. Baseline: SEE OBJECTIVE DATA Goal status: ongoing 09/20/2022 2.  Patient tolerates prosthesis >6 hrs total /day without skin issues or limb pain <3/10 after standing. Baseline: SEE OBJECTIVE DATA Goal status: ongoing 09/20/2022  3.  Patient able to reach 7" and look over both shoulders without  UE support with supervision. Baseline: SEE OBJECTIVE DATA Goal status: ongoing 09/20/2022  4. Patient ambulates 86' with RW & prosthesis with supervision. Baseline: SEE OBJECTIVE DATA Goal status: ongoing 09/20/2022  5. Patient negotiates ramps & curbs with RW & prosthesis with minA. Baseline: SEE OBJECTIVE DATA Goal status: ongoing 09/20/2022  [place]eldner TERM GOALS: Target date: 11/10/2022  Patient demonstrates & verbalized understanding of prosthetic care to enable safe utilization of prosthesis. Baseline: SEE OBJECTIVE DATA Goal status: ongoing 09/20/2022  Patient tolerates prosthesis wear >90% of awake hours without skin or limb pain issues. Baseline: SEE OBJECTIVE DATA Goal status: ongoing 09/20/2022  Berg Balance score >/= 36/56 to indicate lower fall risk Baseline: SEE OBJECTIVE DATA Goal status: ongoing 09/20/2022  Patient ambulates >300' with RW / rollator walker & prosthesis modified independently Baseline: SEE OBJECTIVE DATA Goal status: ongoing 09/20/2022  Patient negotiates ramps, curbs with RW / rollator walker & prosthesis modified independently. Baseline: SEE OBJECTIVE DATA Goal status: ongoing 09/20/2022   PLAN:  PT FREQUENCY: 2x/week  PT DURATION: 8 weeks  PLANNED INTERVENTIONS: Therapeutic exercises, Therapeutic activity, Neuromuscular re-education, Balance training, Gait training, Patient/Family education, Self Care, Stair training,  Vestibular training, Prosthetic training, DME instructions, and physical performance testing  PLAN FOR NEXT SESSION:  gait with SPC, continue to work on stairs and balance  April Manson, PT, [name] 10/12/2022, 2:54 PM

## 2022-10-13 ENCOUNTER — Other Ambulatory Visit: Payer: Self-pay | Admitting: Cardiothoracic Surgery

## 2022-10-13 ENCOUNTER — Ambulatory Visit (HOSPITAL_COMMUNITY)
Admission: RE | Admit: 2022-10-13 | Discharge: 2022-10-13 | Disposition: A | Discharge status: Home / Self Care | Payer: No Typology Code available for payment source | Source: Ambulatory Visit | Attending: Cardiothoracic Surgery | Admitting: Cardiothoracic Surgery

## 2022-10-13 DIAGNOSIS — I2510 Atherosclerotic heart disease of native coronary artery without angina pectoris: Secondary | ICD-10-CM

## 2022-10-13 MED ORDER — GADOBUTROL 1 MMOL/ML IV SOLN
12.0000 mL | Freq: Once | INTRAVENOUS | Status: AC | PRN
  Administered 2022-10-13: 12 mL via INTRAVENOUS

## 2022-10-16 ENCOUNTER — Encounter: Payer: Self-pay | Admitting: Physical Therapy

## 2022-10-16 ENCOUNTER — Ambulatory Visit (INDEPENDENT_AMBULATORY_CARE_PROVIDER_SITE_OTHER): Payer: No Typology Code available for payment source | Admitting: Physical Therapy

## 2022-10-16 DIAGNOSIS — L98498 Non-pressure chronic ulcer of skin of other sites with other specified severity: Secondary | ICD-10-CM | POA: Diagnosis not present

## 2022-10-16 DIAGNOSIS — M6281 Muscle weakness (generalized): Secondary | ICD-10-CM

## 2022-10-16 DIAGNOSIS — M79662 Pain in left lower leg: Secondary | ICD-10-CM

## 2022-10-16 DIAGNOSIS — R2689 Other abnormalities of gait and mobility: Secondary | ICD-10-CM | POA: Diagnosis not present

## 2022-10-16 DIAGNOSIS — R2681 Unsteadiness on feet: Secondary | ICD-10-CM | POA: Diagnosis not present

## 2022-10-16 DIAGNOSIS — Z7409 Other reduced mobility: Secondary | ICD-10-CM

## 2022-10-16 LAB — VAS US ABI WITH/WO TBI: Right ABI: 1.16

## 2022-10-16 NOTE — Therapy (Signed)
OUTPATIENT PHYSICAL THERAPY PROSTHETIC TREATMENT & PROGRESS NOTE   Patient Name: [name] MRN: [id number] DOB:[birth year], 67 y.o., male Today's Date: 10/16/2022  PCP: Nelwyn Salisbury, MD REFERRING PROVIDER: Sheppard Plumber, NP  END OF SESSION:  PT End of Session - 10/16/22 1501     Visit Number 10    Number of Visits 17    Date for PT Re-Evaluation 11/10/22    Authorization Type [employer]    Authorization Time Period 20% co-insurance, 89 PT visits remaining    Authorization - Visit Number 10    Authorization - Number of Visits 89    Progress Note Due on Visit 20    PT Start Time 1502    PT Stop Time 1555    PT Time Calculation (min) 53 min    Equipment Utilized During Treatment Gait belt    Activity Tolerance Patient tolerated treatment well    Behavior During Therapy WFL for tasks assessed/performed                  Past Medical History:  Diagnosis Date   CAD (coronary artery disease)    a. 05/2022 Cath: LM nl, LAD 28m/14m, 70d, D3 90, LCX 31m, 60d, OM1 sev diff dzs, OM2 85p, OM2 subbranch sev diff dzs, RCA 70p/m, 35m.   CHF (congestive heart failure) (HCC)    Chronic HFrEF (heart failure with reduced ejection fraction) (HCC)    a. 05/2022 Echo: EF 25-30%, mild LVH, GrII DD, mid-apical, anteroseptal, and apical HK. Nl RV fxn. RVSP 51.63mmHg. Mildly dil LA. Mod MR/TR. Mild AS/AI.   Diabetes mellitus    Hematospermia    Hypertension    Ischemic cardiomyopathy    a. 05/2022 Echo: EF 25-30%.   Moderate Mitral regurgitation    Moderate tricuspid regurgitation    Nephrolithiasis 02/07/2007   PAD (peripheral artery disease) (HCC)    a. 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   Viral meningitis    Past Surgical History:  Procedure Laterality Date   AMPUTATION Left 05/19/2022   Procedure: AMPUTATION BELOW KNEE;  Surgeon: Renford Dills, MD;  Location: [facility];  Service: Vascular;  Laterality: Left;   CHOLECYSTECTOMY  12-07-14   laparoscopic, per Dr. Rayburn Ma    LOWER EXTREMITY  ANGIOGRAPHY Left 05/16/2022   Procedure: Lower Extremity Angiography;  Surgeon: Renford Dills, MD;  Location: [facility];  Service: Cardiovascular;  Laterality: Left;   RIGHT/LEFT HEART CATH AND CORONARY ANGIOGRAPHY N/A 06/02/2022   Procedure: RIGHT/LEFT HEART CATH AND CORONARY ANGIOGRAPHY;  Surgeon: Yvonne Kendall, MD;  Location: [facility];  Service: Cardiovascular;  Laterality: N/A;   Patient Active Problem List   Diagnosis Date Noted   Atherosclerosis of native arteries of extremity with intermittent claudication (HCC) 10/06/2022   Pneumonia of both upper lobes due to infectious organism 06/14/2022   Dilated cardiomyopathy (HCC) 06/04/2022   Coronary artery disease of native artery of native heart with stable angina pectoris (HCC) 06/03/2022   Ischemic cardiomyopathy 06/03/2022   PAD (peripheral artery disease) (HCC) 06/02/2022   Diabetes mellitus type 2 with complications (HCC) 06/02/2022   Elevated troponin 06/02/2022   HFrEF (heart failure with reduced ejection fraction) (HCC) 05/31/2022   Demand ischemia 05/27/2022   Controlled type 2 diabetes mellitus with hyperglycemia, with Pendry-term current use of insulin (HCC) 05/27/2022   Anemia 05/27/2022   Acute on chronic HFrEF (heart failure with reduced ejection fraction) (HCC) 05/27/2022   Hx of BKA, left (HCC) 05/27/2022   Open displaced avulsion  fracture of tuberosity of left calcaneus 05/23/2022   AMS (altered mental status) 05/21/2022   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 due to underlying condition (HCC) 05/17/2022   Hypertensive urgency 05/16/2022   Gangrene of left foot (HCC) 05/16/2022   Pyogenic inflammation of bone (HCC) 05/16/2022   Diabetic retinopathy associated with type 2 diabetes mellitus (HCC) 01/02/2022   Hypothyroidism 08/23/2021   Type 2 diabetes mellitus with diabetic neuropathy, unspecified (HCC) 04/16/2020   Cataracts, bilateral 04/16/2020   Sinus  tachycardia 01/14/2020   Insomnia 01/07/2020   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 2 diabetes mellitus (HCC) 07/27/2017   Generalized anxiety disorder 07/27/2017   Chronic right flank pain 12/20/2015   Hyperkalemia 10/21/2014   Chest pain 09/21/2014   ADHD 06/22/2010   ERECTILE DYSFUNCTION, ORGANIC 06/22/2010   MORBID OBESITY 08/04/2008   NEPHROLITHIASIS, HX OF 03/22/2007   Essential hypertension 02/22/2007    ONSET DATE: prosthesis delivery 08/29/2022  REFERRING DIAG: Z89.519 (ICD-10-CM) - Hx of BKA   THERAPY DIAG:  Other abnormalities of gait and mobility  Unsteadiness on feet  Non-pressure chronic ulcer of skin of other sites with other specified severity (HCC)  Pain in left lower leg  Impaired functional mobility and activity tolerance  Muscle weakness (generalized)  Rationale for Evaluation and Treatment: Rehabilitation  SUBJECTIVE:   SUBJECTIVE STATEMENT: His limb was hurting more today. He adjusted ply socks which helped.  He has not purchased new cane yet.  He has a folding cane that he does not like.    PERTINENT HISTORY: HTN, IDDM2, diabetic neuropathy, PVD, hypothyroidism, viral meningitis  PAIN:  Are you having pain? No   PRECAUTIONS: Fall  WEIGHT BEARING RESTRICTIONS: No  FALLS: Has patient fallen in last 6 months? No  LIVING ENVIRONMENT: Lives with: lives with their spouse Lives in: House  Home Access: Ramped entrance primary or option of stairs Home layout: One level Stairs: Yes: External: 4-5 steps; bilateral but cannot reach both Has following equipment at home: Single point cane, Walker - 4 wheeled, Wheelchair (manual), Shower bench, bed side commode, Grab bars, and Ramped entry  PLOF: Independent, Independent with household mobility with device, and Independent with community mobility with device    PATIENT GOALS:  to use prosthesis to be mobile to enable most appropriate cardiac surgery.   OBJECTIVE:  COGNITION: Overall cognitive status: Within  functional limits for tasks assessed  POSTURE: rounded shoulders, forward head, flexed trunk , and weight shift right  LOWER EXTREMITY ROM:  ROM P:passive  A:active Right eval Left eval  Hip flexion    Hip extension    Hip abduction    Hip adduction    Hip internal rotation    Hip external rotation    Knee flexion    Knee extension    Ankle dorsiflexion    Ankle plantarflexion    Ankle inversion    Ankle eversion     (Blank rows = not tested)  LOWER EXTREMITY MMT:  MMT Right eval Left eval  Hip flexion    Hip extension    Hip abduction    Hip adduction    Hip internal rotation    Hip external rotation    Knee flexion    Knee extension    Ankle dorsiflexion    Ankle plantarflexion    Ankle inversion    Ankle eversion    (Blank rows = not tested)  TRANSFERS: Sit to stand: SBA 22" w/c using armrests using back of legs against w/c to  stabilize and touch on wall to left.   Stand to sit: SBA RW to 22" w/c using armrests   GAIT: Distance walked: 25' Assistive device utilized: Environmental consultant - 2 wheeled and left TTA prosthesis Level of assistance: Min A Gait pattern: step to pattern, decreased step length- Right, decreased stance time- Left, decreased hip/knee flexion- Left, Left hip hike, knee flexed in stance- Left, antalgic, lateral hip instability, trunk flexed, and abducted- Left Comments: excessive BUE weight bearing.   FUNCTIONAL TESTs:  Sharlene Motts Balance Scale: 14/56   CARDIOVASCULAR RESPONSE: Functional activity: gait & standing balance Pre-activity vitals: HR: 65 SpO2: 100% Post-activity vitals: HR: 77 SpO2: 96%  CURRENT PROSTHETIC WEAR ASSESSMENT: Evaluation / 09/12/2022:  Patient is dependent with: skin check, residual limb care, care of non-amputated limb, prosthetic cleaning, ply sock cleaning, correct ply sock adjustment, proper wear schedule/adjustment, and proper weight-bearing schedule/adjustment Donning prosthesis: Min A Doffing prosthesis:  SBA Prosthetic wear tolerance: 1 hours, 2x/day, 3 days for first 3 of 7 days since delivery but stopped due to skin issue.  Prosthetic weight bearing tolerance: 5 minutes PWB LLE with prosthesis limb pain 5/10 Edema: pitting Residual limb condition: water blisters 17mm x 3 mm distal limb,  pt & wife report incision at distal tibia cracked open with prosthesis wear for first 3 days.  No hair growth, shiny skin, pale color, normal temperature & moisture.  Cylindrical shape.  Prosthetic description: silicon liner with pin lock, secondary interface pelite liner, flexible keel foot.     TODAY'S TREATMENT:                                                                                                                             DATE:  10/16/2022: Prosthetic Training with TTA prosthesis: He reports cleaning properly. Pt independently, correctly donned prosthesis.  He is wearing prosthesis 8-10 hours per day.  PT recommended donning upon arising and doffing with bathing (1-2 hours prior to bedtime). Remove for 1-2 hours midday. Pt verbalized understanding.  PT instructed in signs of sweating & management. Pt verbalized understanding. Pt amb 450' with cane working on scanning (right/left, up/down & diagonals) & maintaining path/pace.  PT demo & verbal cues on stepping over obstacles to maintain line of sight of prosthesis to ensure clearing obstacle.  Picking up obstacle from floor. PT demo & verbal cues on technique & set up to practice at home. 5 reps.  Pt verbalized understanding for HEP.  Navigated up/down 6 inch curb step with SPC and minA/CGA Navigated up/down ramp with SPC and minA/CGA cues for technique   Neuromuscular Re-education: Stepping red theraband - abd, flex, add & ext - with cane & minA 10 reps ea direction BLEs.    10/12/2022: Prosthetic Training with TTA prosthesis: Gait up/down one flight of stairs with SPC and one one handrail, supervision Gait stepping over ankle weights as  obstacles 6 in total X 6 reps with CGA Picking up bottle off floor and placing back down  with cues and demo for technique X 5 Gait with SPC 30 feet X 4 with head turns and CGA Gait with SPC 30 feet X 4 with head nods and CGA Navigated up/down 6 inch curb step with SPC and CGA Navigated up/down ramp with SPC and CGA cues for technique Walking at countertop without UE support with hand hover over counter forward walking 3 round trips and sidestepping 3 round trips. Standing alternating UE rows blue X 15 with CGA Standing shoulder extensions blue X 15 bilat with CGA Standing alternating chest press with blue X 15 with CGA  Threx: Nu Step L7 (first 4 min) then L6 last 2 min for 6 min total, seat #8, LE only   10/10/2022: Prosthetic Training with TTA prosthesis: Showed him where to get quad tip cane on [email or web address] Discussed feel and signs for proper fit of prosthesis and adjusting socks Gait with SPC 30 feet X 4 with head turns and CGA Gait with SPC 30 feet X 4 with head nods and CGA Navigated up/down 6 inch curb step with SPC and CGA Navigated up/down ramp with SPC and CGA cues for technique   Threx:  Leg press DL 16#1 X 10, then Lt leg only 56# 3X10    PATIENT EDUCATION: PATIENT EDUCATED ON FOLLOWING PROSTHETIC CARE: Education details:  Skin check, Residual limb care, Prosthetic cleaning, Correct ply sock adjustment, Propper donning, and Proper wear schedule/adjustment sit with prosthetic heel touching ground / not dangling and Prosthetic wear tolerance: 1 hours 2x/day, daily with plan to increase weekly with no skin issues.   Person educated: Patient and Spouse Education method: Explanation, Demonstration, Tactile cues, and Verbal cues Education comprehension: verbalized understanding, returned demonstration, verbal cues required, tactile cues required, and needs further education  HOME EXERCISE PROGRAM: 09/25/22 Do each exercise 1-2  times per day Do each exercise 5-10  repetitions Hold each exercise for 2 seconds to feel your location  AT SINK FIND YOUR MIDLINE POSITION AND PLACE FEET EQUAL DISTANCE FROM THE MIDLINE.  Try to find this position when standing still for activities.   USE TAPE ON FLOOR TO MARK THE MIDLINE POSITION which is even with middle of sink.  You also should try to feel with your limb pressure in socket.  You are trying to feel with limb what you used to feel with the bottom of your foot.  Side to Side Shift: Moving your hips only (not shoulders): move weight onto your left leg, HOLD/FEEL pressure in socket.  Move back to equal weight on each leg, HOLD/FEEL pressure in socket. Move weight onto your right leg, HOLD/FEEL pressure in socket. Move back to equal weight on each leg, HOLD/FEEL pressure in socket. Repeat.  Start with both hands on sink, progress to hand on prosthetic side only, then no hands.  Front to Back Shift: Moving your hips only (not shoulders): move your weight forward onto your toes, HOLD/FEEL pressure in socket. Move your weight back to equal Flat Foot on both legs, HOLD/FEEL  pressure in socket. Move your weight back onto your heels, HOLD/FEEL  pressure in socket. Move your weight back to equal on both legs, HOLD/FEEL  pressure in socket. Repeat.  Start with both hands on sink, progress to hand on prosthetic side only, then no hands.  Moving Cones / Cups: With equal weight on each leg: Hold on with one hand the first time, then progress to no hand supports. Move cups from one side of sink to the other. Place cups ~2"  out of your reach, progress to 10" beyond reach.  Place one hand in middle of sink and reach with other hand. Do both arms.  Then hover one hand and move cups with other hand.  Overhead/Upward Reaching: alternated reaching up to top cabinets or ceiling if no cabinets present. Keep equal weight on each leg. Start with one hand support on counter while other hand reaches and progress to no hand support with reaching.   ace one hand in middle of sink and reach with other hand. Do both arms.  Then hover one hand and move cups with other hand.  5.   Looking Over Shoulders: With equal weight on each leg: alternate turning to look over your shoulders with one hand support on counter as needed.  Start with head motions only to look in front of shoulder, then even with shoulder and progress to looking behind you. To look to side, move head /eyes, then shoulder on side looking pulls back, shift more weight to side looking and pull hip back. Place one hand in middle of sink and let go with other hand so your shoulder can pull back. Switch hands to look other way.   Then hover one hand and look over shoulder. If looking right, use left hand at sink. If looking left, use right hand at sink. 6.  Stepping with leg that is not amputated:  Move items under cabinet out of your way. Shift your hips/pelvis so weight on prosthesis. Tighten muscles in hip on prosthetic side.  SLOWLY step other leg so front of foot is in cabinet. Then step back to floor.   ASSESSMENT:  CLINICAL IMPRESSION: Patient met all STGs.  He is improving with balance & functional activities with his prosthesis.  Pt continues to benefit from skilled PT.   OBJECTIVE IMPAIRMENTS: Abnormal gait, cardiopulmonary status limiting activity, decreased activity tolerance, decreased balance, decreased endurance, decreased knowledge of condition, decreased knowledge of use of DME, decreased mobility, difficulty walking, decreased strength, increased edema, prosthetic dependency , obesity, and pain.   ACTIVITY LIMITATIONS: carrying, lifting, bending, standing, stairs, transfers, and locomotion level  PARTICIPATION LIMITATIONS: cleaning, driving, and community activity  PERSONAL FACTORS: Fitness, Time since onset of injury/illness/exacerbation, and 3+ comorbidities: see PMH  are also affecting patient's functional outcome.   REHAB POTENTIAL: Good  CLINICAL DECISION MAKING:  Evolving/moderate complexity  EVALUATION COMPLEXITY: Moderate   GOALS: Goals reviewed with patient? Yes  SHORT TERM GOALS: Target date: 10/13/2022  Patient donnes prosthesis modified independent & verbalizes proper cleaning. Baseline: SEE OBJECTIVE DATA Goal status: MET 10/16/2022 2.  Patient tolerates prosthesis >6 hrs total /day without skin issues or limb pain <3/10 after standing. Baseline: SEE OBJECTIVE DATA Goal status:  MET 10/16/2022  3.  Patient able to reach 7" and look over both shoulders without UE support with supervision. Baseline: SEE OBJECTIVE DATA Goal status:  MET 10/16/2022  4. Patient ambulates 45' with RW & prosthesis with supervision. Baseline: SEE OBJECTIVE DATA Goal status:  MET 10/16/2022  5. Patient negotiates ramps & curbs with RW & prosthesis with minA. Baseline: SEE OBJECTIVE DATA Goal status:  MET 10/16/2022  [place] TERM GOALS: Target date: 11/10/2022  Patient demonstrates & verbalized understanding of prosthetic care to enable safe utilization of prosthesis. Baseline: SEE OBJECTIVE DATA Goal status: ongoing 09/20/2022  Patient tolerates prosthesis wear >90% of awake hours without skin or limb pain issues. Baseline: SEE OBJECTIVE DATA Goal status: ongoing 09/20/2022  Berg Balance score >/= 36/56 to indicate lower fall risk  Baseline: SEE OBJECTIVE DATA Goal status: ongoing 09/20/2022  Patient ambulates >300' with RW / rollator walker & prosthesis modified independently Baseline: SEE OBJECTIVE DATA Goal status: ongoing 09/20/2022  Patient negotiates ramps, curbs with RW / rollator walker & prosthesis modified independently. Baseline: SEE OBJECTIVE DATA Goal status: ongoing 09/20/2022   PLAN:  PT FREQUENCY: 2x/week  PT DURATION: 8 weeks  PLANNED INTERVENTIONS: Therapeutic exercises, Therapeutic activity, Neuromuscular re-education, Balance training, Gait training, Patient/Family education, Self Care, Stair training, Vestibular training, Prosthetic  training, DME instructions, and physical performance testing  PLAN FOR NEXT SESSION:  continue gait with SPC, continue to work on stairs and balance   Vladimir Faster, PT, [name] 10/16/2022, 4:08 PM

## 2022-10-18 ENCOUNTER — Encounter: Payer: Self-pay | Admitting: Physical Therapy

## 2022-10-18 ENCOUNTER — Ambulatory Visit: Payer: No Typology Code available for payment source | Admitting: Physical Therapy

## 2022-10-18 DIAGNOSIS — R2681 Unsteadiness on feet: Secondary | ICD-10-CM

## 2022-10-18 DIAGNOSIS — M79662 Pain in left lower leg: Secondary | ICD-10-CM | POA: Diagnosis not present

## 2022-10-18 DIAGNOSIS — M6281 Muscle weakness (generalized): Secondary | ICD-10-CM

## 2022-10-18 DIAGNOSIS — R2689 Other abnormalities of gait and mobility: Secondary | ICD-10-CM | POA: Diagnosis not present

## 2022-10-18 DIAGNOSIS — L98498 Non-pressure chronic ulcer of skin of other sites with other specified severity: Secondary | ICD-10-CM

## 2022-10-18 DIAGNOSIS — Z7409 Other reduced mobility: Secondary | ICD-10-CM

## 2022-10-18 NOTE — Therapy (Signed)
OUTPATIENT PHYSICAL THERAPY PROSTHETIC TREATMENT   Patient Name: [name] MRN: [id number] DOB:1955-07-15, 67 y.o., male Today's Date: 10/18/2022  PCP: Nelwyn Salisbury, MD REFERRING PROVIDER: Sheppard Plumber, NP  END OF SESSION:  PT End of Session - 10/18/22 1513     Visit Number 11    Number of Visits 17    Date for PT Re-Evaluation 11/10/22    Authorization Type [employer]    Authorization Time Period 20% co-insurance, 89 PT visits remaining    Authorization - Visit Number 11    Authorization - Number of Visits 89    Progress Note Due on Visit 20    PT Start Time 1511    PT Stop Time 1601    PT Time Calculation (min) 50 min    Equipment Utilized During Treatment Gait belt    Activity Tolerance Patient tolerated treatment well    Behavior During Therapy WFL for tasks assessed/performed                   Past Medical History:  Diagnosis Date   CAD (coronary artery disease)    a. 05/2022 Cath: LM nl, LAD 29m/67m, 70d, D3 90, LCX 66m, 60d, OM1 sev diff dzs, OM2 85p, OM2 subbranch sev diff dzs, RCA 70p/m, 61m.   CHF (congestive heart failure) (HCC)    Chronic HFrEF (heart failure with reduced ejection fraction) (HCC)    a. 05/2022 Echo: EF 25-30%, mild LVH, GrII DD, mid-apical, anteroseptal, and apical HK. Nl RV fxn. RVSP 51.51mmHg. Mildly dil LA. Mod MR/TR. Mild AS/AI.   Diabetes mellitus    Hematospermia    Hypertension    Ischemic cardiomyopathy    a. 05/2022 Echo: EF 25-30%.   Moderate Mitral regurgitation    Moderate tricuspid regurgitation    Nephrolithiasis 02/07/2007   PAD (peripheral artery disease) (HCC)    a. 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   Viral meningitis    Past Surgical History:  Procedure Laterality Date   AMPUTATION Left 05/19/2022   Procedure: AMPUTATION BELOW KNEE;  Surgeon: Renford Dills, MD;  Location: [facility];  Service: Vascular;  Laterality: Left;   CHOLECYSTECTOMY  12-07-14   laparoscopic, per Dr. Rayburn Ma    LOWER EXTREMITY ANGIOGRAPHY Left  05/16/2022   Procedure: Lower Extremity Angiography;  Surgeon: Renford Dills, MD;  Location: [facility];  Service: Cardiovascular;  Laterality: Left;   RIGHT/LEFT HEART CATH AND CORONARY ANGIOGRAPHY N/A 06/02/2022   Procedure: RIGHT/LEFT HEART CATH AND CORONARY ANGIOGRAPHY;  Surgeon: Yvonne Kendall, MD;  Location: [facility];  Service: Cardiovascular;  Laterality: N/A;   Patient Active Problem List   Diagnosis Date Noted   Atherosclerosis of native arteries of extremity with intermittent claudication (HCC) 10/06/2022   Pneumonia of both upper lobes due to infectious organism 06/14/2022   Dilated cardiomyopathy (HCC) 06/04/2022   Coronary artery disease of native artery of native heart with stable angina pectoris (HCC) 06/03/2022   Ischemic cardiomyopathy 06/03/2022   PAD (peripheral artery disease) (HCC) 06/02/2022   Diabetes mellitus type 2 with complications (HCC) 06/02/2022   Elevated troponin 06/02/2022   HFrEF (heart failure with reduced ejection fraction) (HCC) 05/31/2022   Demand ischemia 05/27/2022   Controlled type 2 diabetes mellitus with hyperglycemia, with Walczyk-term current use of insulin (HCC) 05/27/2022   Anemia 05/27/2022   Acute on chronic HFrEF (heart failure with reduced ejection fraction) (HCC) 05/27/2022   Hx of BKA, left (HCC) 05/27/2022   Open displaced avulsion fracture of  tuberosity of left calcaneus 05/23/2022   AMS (altered mental status) 05/21/2022   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 due to underlying condition (HCC) 05/17/2022   Hypertensive urgency 05/16/2022   Gangrene of left foot (HCC) 05/16/2022   Pyogenic inflammation of bone (HCC) 05/16/2022   Diabetic retinopathy associated with type 2 diabetes mellitus (HCC) 01/02/2022   Hypothyroidism 08/23/2021   Type 2 diabetes mellitus with diabetic neuropathy, unspecified (HCC) 04/16/2020   Cataracts, bilateral 04/16/2020   Sinus tachycardia 01/14/2020    Insomnia 01/07/2020   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 2 diabetes mellitus (HCC) 07/27/2017   Generalized anxiety disorder 07/27/2017   Chronic right flank pain 12/20/2015   Hyperkalemia 10/21/2014   Chest pain 09/21/2014   ADHD 06/22/2010   ERECTILE DYSFUNCTION, ORGANIC 06/22/2010   MORBID OBESITY 08/04/2008   NEPHROLITHIASIS, HX OF 03/22/2007   Essential hypertension 02/22/2007    ONSET DATE: prosthesis delivery 08/29/2022  REFERRING DIAG: Z89.519 (ICD-10-CM) - Hx of BKA   THERAPY DIAG:  Other abnormalities of gait and mobility  Unsteadiness on feet  Non-pressure chronic ulcer of skin of other sites with other specified severity (HCC)  Pain in left lower leg  Impaired functional mobility and activity tolerance  Muscle weakness (generalized)  Rationale for Evaluation and Treatment: Rehabilitation  SUBJECTIVE:   SUBJECTIVE STATEMENT: He has been hearing air escape from prosthesis but checked for sweating which was not wet.     PERTINENT HISTORY: HTN, IDDM2, diabetic neuropathy, PVD, hypothyroidism, viral meningitis  PAIN:  Are you having pain? No   PRECAUTIONS: Fall  WEIGHT BEARING RESTRICTIONS: No  FALLS: Has patient fallen in last 6 months? No  LIVING ENVIRONMENT: Lives with: lives with their spouse Lives in: House  Home Access: Ramped entrance primary or option of stairs Home layout: One level Stairs: Yes: External: 4-5 steps; bilateral but cannot reach both Has following equipment at home: Single point cane, Walker - 4 wheeled, Wheelchair (manual), Shower bench, bed side commode, Grab bars, and Ramped entry  PLOF: Independent, Independent with household mobility with device, and Independent with community mobility with device    PATIENT GOALS:  to use prosthesis to be mobile to enable most appropriate cardiac surgery.   OBJECTIVE:  COGNITION: Overall cognitive status: Within functional limits for tasks assessed  POSTURE: rounded shoulders, forward head, flexed  trunk , and weight shift right  LOWER EXTREMITY ROM:  ROM P:passive  A:active Right eval Left eval  Hip flexion    Hip extension    Hip abduction    Hip adduction    Hip internal rotation    Hip external rotation    Knee flexion    Knee extension    Ankle dorsiflexion    Ankle plantarflexion    Ankle inversion    Ankle eversion     (Blank rows = not tested)  LOWER EXTREMITY MMT:  MMT Right eval Left eval  Hip flexion    Hip extension    Hip abduction    Hip adduction    Hip internal rotation    Hip external rotation    Knee flexion    Knee extension    Ankle dorsiflexion    Ankle plantarflexion    Ankle inversion    Ankle eversion    (Blank rows = not tested)  TRANSFERS: Sit to stand: SBA 22" w/c using armrests using back of legs against w/c to stabilize and touch on wall to left.   Stand to sit: SBA RW to 22"  w/c using armrests   GAIT: Distance walked: 25' Assistive device utilized: Environmental consultant - 2 wheeled and left TTA prosthesis Level of assistance: Min A Gait pattern: step to pattern, decreased step length- Right, decreased stance time- Left, decreased hip/knee flexion- Left, Left hip hike, knee flexed in stance- Left, antalgic, lateral hip instability, trunk flexed, and abducted- Left Comments: excessive BUE weight bearing.   FUNCTIONAL TESTs:  Sharlene Motts Balance Scale: 14/56   CARDIOVASCULAR RESPONSE: Functional activity: gait & standing balance Pre-activity vitals: HR: 65 SpO2: 100% Post-activity vitals: HR: 77 SpO2: 96%  CURRENT PROSTHETIC WEAR ASSESSMENT: Evaluation / 09/12/2022:  Patient is dependent with: skin check, residual limb care, care of non-amputated limb, prosthetic cleaning, ply sock cleaning, correct ply sock adjustment, proper wear schedule/adjustment, and proper weight-bearing schedule/adjustment Donning prosthesis: Min A Doffing prosthesis: SBA Prosthetic wear tolerance: 1 hours, 2x/day, 3 days for first 3 of 7 days since delivery but  stopped due to skin issue.  Prosthetic weight bearing tolerance: 5 minutes PWB LLE with prosthesis limb pain 5/10 Edema: pitting Residual limb condition: water blisters 17mm x 3 mm distal limb,  pt & wife report incision at distal tibia cracked open with prosthesis wear for first 3 days.  No hair growth, shiny skin, pale color, normal temperature & moisture.  Cylindrical shape.  Prosthetic description: silicon liner with pin lock, secondary interface pelite liner, flexible keel foot.     TODAY'S TREATMENT:                                                                                                                             DATE:  10/18/2022: Prosthetic Training with TTA prosthesis: PT educated patient on use of cut off sock under upper liner to decrease skin irritation.  PT demo verbal cues on using cutoff socks to adjust ply socks more distally with limb shrinkage than proximally in knee area that shrinks less.  Patient verbalized understanding and reports weight distribution within socket felt better with cut off socks and full length socks. Patient questioning squeak in prosthesis.  Appears to be that enter sock inside prosthetic shell and foot is not covering metal keel fully.  This requires a special told by prosthetists to fix.  patient is aware that he needs to see prosthetist. Patient ambulated 150 feet with cane stand-alone tip safely.  Patient appears safe to use cane inside his home at this time. Patient negotiated ramp and curb with cane with PT demo and verbal cues on technique with intermittent min assist for balance.  PT directed and proper hand-held assist if he has an adult that is capable of assisting him in the community.  Patient does not appear safe to use cane on ramp and curb at this time. PT instructed patient in walking program with short, medium and Silliman walk to build endurance and stamina.  See HEP below. Patient negotiated flight of 11 steps with single rail and cane  switching side of rail half way.  PT gave verbal cues to improve / correct technique.      10/16/2022: Prosthetic Training with TTA prosthesis: He reports cleaning properly. Pt independently, correctly donned prosthesis.  He is wearing prosthesis 8-10 hours per day.  PT recommended donning upon arising and doffing with bathing (1-2 hours prior to bedtime). Remove for 1-2 hours midday. Pt verbalized understanding.  PT instructed in signs of sweating & management. Pt verbalized understanding. Pt amb 450' with cane working on scanning (right/left, up/down & diagonals) & maintaining path/pace.  PT demo & verbal cues on stepping over obstacles to maintain line of sight of prosthesis to ensure clearing obstacle.  Picking up obstacle from floor. PT demo & verbal cues on technique & set up to practice at home. 5 reps.  Pt verbalized understanding for HEP.  Navigated up/down 6 inch curb step with SPC and minA/CGA Navigated up/down ramp with SPC and minA/CGA cues for technique   Neuromuscular Re-education: Stepping red theraband - abd, flex, add & ext - with cane & minA 10 reps ea direction BLEs.    10/12/2022: Prosthetic Training with TTA prosthesis: Gait up/down one flight of stairs with SPC and one one handrail, supervision Gait stepping over ankle weights as obstacles 6 in total X 6 reps with CGA Picking up bottle off floor and placing back down with cues and demo for technique X 5 Gait with SPC 30 feet X 4 with head turns and CGA Gait with SPC 30 feet X 4 with head nods and CGA Navigated up/down 6 inch curb step with SPC and CGA Navigated up/down ramp with SPC and CGA cues for technique Walking at countertop without UE support with hand hover over counter forward walking 3 round trips and sidestepping 3 round trips. Standing alternating UE rows blue X 15 with CGA Standing shoulder extensions blue X 15 bilat with CGA Standing alternating chest press with blue X 15 with CGA  Threx: Nu Step  L7 (first 4 min) then L6 last 2 min for 6 min total, seat #8, LE only    PATIENT EDUCATION: PATIENT EDUCATED ON FOLLOWING PROSTHETIC CARE: Education details:  Skin check, Residual limb care, Prosthetic cleaning, Correct ply sock adjustment, Propper donning, and Proper wear schedule/adjustment sit with prosthetic heel touching ground / not dangling and Prosthetic wear tolerance: 1 hours 2x/day, daily with plan to increase weekly with no skin issues.   Person educated: Patient and Spouse Education method: Explanation, Demonstration, Tactile cues, and Verbal cues Education comprehension: verbalized understanding, returned demonstration, verbal cues required, tactile cues required, and needs further education  HOME EXERCISE PROGRAM: 09/25/22 Do each exercise 1-2  times per day Do each exercise 5-10 repetitions Hold each exercise for 2 seconds to feel your location  AT SINK FIND YOUR MIDLINE POSITION AND PLACE FEET EQUAL DISTANCE FROM THE MIDLINE.  Try to find this position when standing still for activities.   USE TAPE ON FLOOR TO MARK THE MIDLINE POSITION which is even with middle of sink.  You also should try to feel with your limb pressure in socket.  You are trying to feel with limb what you used to feel with the bottom of your foot.  Side to Side Shift: Moving your hips only (not shoulders): move weight onto your left leg, HOLD/FEEL pressure in socket.  Move back to equal weight on each leg, HOLD/FEEL pressure in socket. Move weight onto your right leg, HOLD/FEEL pressure in socket. Move back to equal weight on each leg, HOLD/FEEL pressure in socket. Repeat.  Start with both hands on sink, progress to hand on prosthetic side only, then no hands.  Front to Back Shift: Moving your hips only (not shoulders): move your weight forward onto your toes, HOLD/FEEL pressure in socket. Move your weight back to equal Flat Foot on both legs, HOLD/FEEL  pressure in socket. Move your weight back onto your  heels, HOLD/FEEL  pressure in socket. Move your weight back to equal on both legs, HOLD/FEEL  pressure in socket. Repeat.  Start with both hands on sink, progress to hand on prosthetic side only, then no hands.  Moving Cones / Cups: With equal weight on each leg: Hold on with one hand the first time, then progress to no hand supports. Move cups from one side of sink to the other. Place cups ~2" out of your reach, progress to 10" beyond reach.  Place one hand in middle of sink and reach with other hand. Do both arms.  Then hover one hand and move cups with other hand.  Overhead/Upward Reaching: alternated reaching up to top cabinets or ceiling if no cabinets present. Keep equal weight on each leg. Start with one hand support on counter while other hand reaches and progress to no hand support with reaching.  ace one hand in middle of sink and reach with other hand. Do both arms.  Then hover one hand and move cups with other hand.  5.   Looking Over Shoulders: With equal weight on each leg: alternate turning to look over your shoulders with one hand support on counter as needed.  Start with head motions only to look in front of shoulder, then even with shoulder and progress to looking behind you. To look to side, move head /eyes, then shoulder on side looking pulls back, shift more weight to side looking and pull hip back. Place one hand in middle of sink and let go with other hand so your shoulder can pull back. Switch hands to look other way.   Then hover one hand and look over shoulder. If looking right, use left hand at sink. If looking left, use right hand at sink. 6.  Stepping with leg that is not amputated:  Move items under cabinet out of your way. Shift your hips/pelvis so weight on prosthesis. Tighten muscles in hip on prosthetic side.  SLOWLY step other leg so front of foot is in cabinet. Then step back to floor.   Walking Program 4-7 days/week Use rolling walker. Have a resting area close.  See  levels below. Progress to next level when current level is not challenge or difficult.  Start walking 5 min, rest 5 min for 2 sets Add 3rd set. So you are walking total of 15 min.  Decrease rest to 4 min. Walk 5 min for 3 sets Walk 6 min, rest 4 min for 3 sets. Decrease rest to 3 min. Walking 6 min for 3 sets Walk 7 min, rest 3 min, 3 sets Walk 10 min, rest 5 min 2 sets Walk 11 min, rest 5 min, 2 sets Walk 12 min, rest 5 min, 2 sets Walk 13 min, rest 5 min, 2 sets Walk 14 min, rest 5 min, 2 sets Walk 15 min, rest 5 min, 2 sets Walk 15 min, rest 4 min, 2 sets Walk 15 min, rest 3 min, 2 sets Walk 15 min, rest 2 min, 2 sets Walk 15 min, rest 1 min, 2 sets Walk 30 min  Hosman walk see above.  Short distances which is  walking from one room to another. Work to increase frequency back to prior level. Try with cane.    Medium distances is distance between short & Padget. (More than room to room but not maximum tolerated distance). You may be entering & exiting your home or community with limited distances. Goal is 4 medium walks which may going to one place in community.    ASSESSMENT:  CLINICAL IMPRESSION: Patient appears safe to use cane at household level at this time.  He continues to need further work on using cane for community based activities and balance.  Patient appears to understand updated prosthetic care instructions given today.  Pt continues to benefit from skilled PT.   OBJECTIVE IMPAIRMENTS: Abnormal gait, cardiopulmonary status limiting activity, decreased activity tolerance, decreased balance, decreased endurance, decreased knowledge of condition, decreased knowledge of use of DME, decreased mobility, difficulty walking, decreased strength, increased edema, prosthetic dependency , obesity, and pain.   ACTIVITY LIMITATIONS: carrying, lifting, bending, standing, stairs, transfers, and locomotion level  PARTICIPATION LIMITATIONS: cleaning, driving, and community  activity  PERSONAL FACTORS: Fitness, Time since onset of injury/illness/exacerbation, and 3+ comorbidities: see PMH  are also affecting patient's functional outcome.   REHAB POTENTIAL: Good  CLINICAL DECISION MAKING: Evolving/moderate complexity  EVALUATION COMPLEXITY: Moderate   GOALS: Goals reviewed with patient? Yes  SHORT TERM GOALS: Target date: 10/13/2022  Patient donnes prosthesis modified independent & verbalizes proper cleaning. Baseline: SEE OBJECTIVE DATA Goal status: MET 10/16/2022 2.  Patient tolerates prosthesis >6 hrs total /day without skin issues or limb pain <3/10 after standing. Baseline: SEE OBJECTIVE DATA Goal status:  MET 10/16/2022  3.  Patient able to reach 7" and look over both shoulders without UE support with supervision. Baseline: SEE OBJECTIVE DATA Goal status:  MET 10/16/2022  4. Patient ambulates 23' with RW & prosthesis with supervision. Baseline: SEE OBJECTIVE DATA Goal status:  MET 10/16/2022  5. Patient negotiates ramps & curbs with RW & prosthesis with minA. Baseline: SEE OBJECTIVE DATA Goal status:  MET 10/16/2022  Knarr TERM GOALS: Target date: 11/10/2022  Patient demonstrates & verbalized understanding of prosthetic care to enable safe utilization of prosthesis. Baseline: SEE OBJECTIVE DATA Goal status: ongoing 09/20/2022  Patient tolerates prosthesis wear >90% of awake hours without skin or limb pain issues. Baseline: SEE OBJECTIVE DATA Goal status: ongoing 09/20/2022  Berg Balance score >/= 36/56 to indicate lower fall risk Baseline: SEE OBJECTIVE DATA Goal status: ongoing 09/20/2022  Patient ambulates >300' with RW / rollator walker & prosthesis modified independently Baseline: SEE OBJECTIVE DATA Goal status: ongoing 09/20/2022  Patient negotiates ramps, curbs with RW / rollator walker & prosthesis modified independently. Baseline: SEE OBJECTIVE DATA Goal status: ongoing 09/20/2022   PLAN:  PT FREQUENCY: 2x/week  PT DURATION: 8  weeks  PLANNED INTERVENTIONS: Therapeutic exercises, Therapeutic activity, Neuromuscular re-education, Balance training, Gait training, Patient/Family education, Self Care, Stair training, Vestibular training, Prosthetic training, DME instructions, and physical performance testing  PLAN FOR NEXT SESSION:   continue community based gait activities with SPC including ramp/curb, continue to work on stairs and balance   Vladimir Faster, PT, [name] 10/18/2022, 4:14 PM

## 2022-10-18 NOTE — Patient Instructions (Signed)
Walking Program 4-7 days/week Use rolling walker. Have a resting area close.  See levels below. Progress to next level when current level is not challenge or difficult.  Start walking 5 min, rest 5 min for 2 sets Add 3rd set. So you are walking total of 15 min.  Decrease rest to 4 min. Walk 5 min for 3 sets Walk 6 min, rest 4 min for 3 sets. Decrease rest to 3 min. Walking 6 min for 3 sets Walk 7 min, rest 3 min, 3 sets Walk 10 min, rest 5 min 2 sets Walk 11 min, rest 5 min, 2 sets Walk 12 min, rest 5 min, 2 sets Walk 13 min, rest 5 min, 2 sets Walk 14 min, rest 5 min, 2 sets Walk 15 min, rest 5 min, 2 sets Walk 15 min, rest 4 min, 2 sets Walk 15 min, rest 3 min, 2 sets Walk 15 min, rest 2 min, 2 sets Walk 15 min, rest 1 min, 2 sets Walk 30 min  Elsea walk see above.  Short distances which is walking from one room to another. Work to increase frequency back to prior level. Try with cane.    Medium distances is distance between short & [place]aney. (More than room to room but not maximum tolerated distance). You may be entering & exiting your home or community with limited distances. Goal is 4 medium walks which may going to one place in community.

## 2022-10-19 ENCOUNTER — Ambulatory Visit: Payer: No Typology Code available for payment source | Admitting: Cardiothoracic Surgery

## 2022-10-23 ENCOUNTER — Ambulatory Visit (INDEPENDENT_AMBULATORY_CARE_PROVIDER_SITE_OTHER): Payer: No Typology Code available for payment source | Admitting: Physical Therapy

## 2022-10-23 ENCOUNTER — Encounter: Payer: Self-pay | Admitting: Physical Therapy

## 2022-10-23 DIAGNOSIS — L98498 Non-pressure chronic ulcer of skin of other sites with other specified severity: Secondary | ICD-10-CM

## 2022-10-23 DIAGNOSIS — R2689 Other abnormalities of gait and mobility: Secondary | ICD-10-CM

## 2022-10-23 DIAGNOSIS — M6281 Muscle weakness (generalized): Secondary | ICD-10-CM

## 2022-10-23 DIAGNOSIS — M79662 Pain in left lower leg: Secondary | ICD-10-CM

## 2022-10-23 DIAGNOSIS — R2681 Unsteadiness on feet: Secondary | ICD-10-CM

## 2022-10-23 DIAGNOSIS — Z7409 Other reduced mobility: Secondary | ICD-10-CM

## 2022-10-23 NOTE — Therapy (Signed)
OUTPATIENT PHYSICAL THERAPY PROSTHETIC TREATMENT   Patient Name: [name] MRN: [id number] DOB:[birth year], 67 y.o., male Today's Date: 10/23/2022  PCP: Nelwyn Salisbury, MD REFERRING PROVIDER: Sheppard Plumber, NP  END OF SESSION:  PT End of Session - 10/23/22 1511     Visit Number 12    Number of Visits 17    Date for PT Re-Evaluation 11/10/22    Authorization Type [employer]    Authorization Time Period 20% co-insurance, 89 PT visits remaining    Authorization - Visit Number 12    Authorization - Number of Visits 89    Progress Note Due on Visit 20    PT Start Time 1515    PT Stop Time 1555    PT Time Calculation (min) 40 min    Equipment Utilized During Treatment Gait belt    Activity Tolerance Patient tolerated treatment well    Behavior During Therapy WFL for tasks assessed/performed                   Past Medical History:  Diagnosis Date   CAD (coronary artery disease)    a. 05/2022 Cath: LM nl, LAD 64m/90m, 70d, D3 90, LCX 57m, 60d, OM1 sev diff dzs, OM2 85p, OM2 subbranch sev diff dzs, RCA 70p/m, 16m.   CHF (congestive heart failure) (HCC)    Chronic HFrEF (heart failure with reduced ejection fraction) (HCC)    a. 05/2022 Echo: EF 25-30%, mild LVH, GrII DD, mid-apical, anteroseptal, and apical HK. Nl RV fxn. RVSP 51.14mmHg. Mildly dil LA. Mod MR/TR. Mild AS/AI.   Diabetes mellitus    Hematospermia    Hypertension    Ischemic cardiomyopathy    a. 05/2022 Echo: EF 25-30%.   Moderate Mitral regurgitation    Moderate tricuspid regurgitation    Nephrolithiasis 02/07/2007   PAD (peripheral artery disease) (HCC)    a. 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   Viral meningitis    Past Surgical History:  Procedure Laterality Date   AMPUTATION Left 05/19/2022   Procedure: AMPUTATION BELOW KNEE;  Surgeon: Renford Dills, MD;  Location: [facility];  Service: Vascular;  Laterality: Left;   CHOLECYSTECTOMY  12-07-14   laparoscopic, per Dr. Rayburn Ma    LOWER EXTREMITY ANGIOGRAPHY Left  05/16/2022   Procedure: Lower Extremity Angiography;  Surgeon: Renford Dills, MD;  Location: [facility];  Service: Cardiovascular;  Laterality: Left;   RIGHT/LEFT HEART CATH AND CORONARY ANGIOGRAPHY N/A 06/02/2022   Procedure: RIGHT/LEFT HEART CATH AND CORONARY ANGIOGRAPHY;  Surgeon: Yvonne Kendall, MD;  Location: [facility];  Service: Cardiovascular;  Laterality: N/A;   Patient Active Problem List   Diagnosis Date Noted   Atherosclerosis of native arteries of extremity with intermittent claudication (HCC) 10/06/2022   Pneumonia of both upper lobes due to infectious organism 06/14/2022   Dilated cardiomyopathy (HCC) 06/04/2022   Coronary artery disease of native artery of native heart with stable angina pectoris (HCC) 06/03/2022   Ischemic cardiomyopathy 06/03/2022   PAD (peripheral artery disease) (HCC) 06/02/2022   Diabetes mellitus type 2 with complications (HCC) 06/02/2022   Elevated troponin 06/02/2022   HFrEF (heart failure with reduced ejection fraction) (HCC) 05/31/2022   Demand ischemia 05/27/2022   Controlled type 2 diabetes mellitus with hyperglycemia, with [place]-term current use of insulin (HCC) 05/27/2022   Anemia 05/27/2022   Acute on chronic HFrEF (heart failure with reduced ejection fraction) (HCC) 05/27/2022   Hx of BKA, left (HCC) 05/27/2022   Open displaced avulsion fracture of  tuberosity of left calcaneus 05/23/2022   AMS (altered mental status) 05/21/2022   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 due to underlying condition (HCC) 05/17/2022   Hypertensive urgency 05/16/2022   Gangrene of left foot (HCC) 05/16/2022   Pyogenic inflammation of bone (HCC) 05/16/2022   Diabetic retinopathy associated with type 2 diabetes mellitus (HCC) 01/02/2022   Hypothyroidism 08/23/2021   Type 2 diabetes mellitus with diabetic neuropathy, unspecified (HCC) 04/16/2020   Cataracts, bilateral 04/16/2020   Sinus tachycardia 01/14/2020    Insomnia 01/07/2020   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 2 diabetes mellitus (HCC) 07/27/2017   Generalized anxiety disorder 07/27/2017   Chronic right flank pain 12/20/2015   Hyperkalemia 10/21/2014   Chest pain 09/21/2014   ADHD 06/22/2010   ERECTILE DYSFUNCTION, ORGANIC 06/22/2010   MORBID OBESITY 08/04/2008   NEPHROLITHIASIS, HX OF 03/22/2007   Essential hypertension 02/22/2007    ONSET DATE: prosthesis delivery 08/29/2022  REFERRING DIAG: Z89.519 (ICD-10-CM) - Hx of BKA   THERAPY DIAG:  Other abnormalities of gait and mobility  Unsteadiness on feet  Non-pressure chronic ulcer of skin of other sites with other specified severity (HCC)  Pain in left lower leg  Impaired functional mobility and activity tolerance  Muscle weakness (generalized)  Rationale for Evaluation and Treatment: Rehabilitation  SUBJECTIVE:   SUBJECTIVE STATEMENT: He got cane on Thursday after PT session.  He has been using cane in house without issues.  He is wearing prosthesis all awake hours except 1 hour mid day.     PERTINENT HISTORY: HTN, IDDM2, diabetic neuropathy, PVD, hypothyroidism, viral meningitis  PAIN:  Are you having pain? No   PRECAUTIONS: Fall  WEIGHT BEARING RESTRICTIONS: No  FALLS: Has patient fallen in last 6 months? No  LIVING ENVIRONMENT: Lives with: lives with their spouse Lives in: House  Home Access: Ramped entrance primary or option of stairs Home layout: One level Stairs: Yes: External: 4-5 steps; bilateral but cannot reach both Has following equipment at home: Single point cane, Walker - 4 wheeled, Wheelchair (manual), Shower bench, bed side commode, Grab bars, and Ramped entry  PLOF: Independent, Independent with household mobility with device, and Independent with community mobility with device    PATIENT GOALS:  to use prosthesis to be mobile to enable most appropriate cardiac surgery.   OBJECTIVE:  COGNITION: Overall cognitive status: Within functional limits for  tasks assessed  POSTURE: rounded shoulders, forward head, flexed trunk , and weight shift right  LOWER EXTREMITY ROM:  ROM P:passive  A:active Right eval Left eval  Hip flexion    Hip extension    Hip abduction    Hip adduction    Hip internal rotation    Hip external rotation    Knee flexion    Knee extension    Ankle dorsiflexion    Ankle plantarflexion    Ankle inversion    Ankle eversion     (Blank rows = not tested)  LOWER EXTREMITY MMT:  MMT Right eval Left eval  Hip flexion    Hip extension    Hip abduction    Hip adduction    Hip internal rotation    Hip external rotation    Knee flexion    Knee extension    Ankle dorsiflexion    Ankle plantarflexion    Ankle inversion    Ankle eversion    (Blank rows = not tested)  TRANSFERS: Sit to stand: SBA 22" w/c using armrests using back of legs against w/c to stabilize  and touch on wall to left.   Stand to sit: SBA RW to 22" w/c using armrests   GAIT: Distance walked: 25' Assistive device utilized: Environmental consultant - 2 wheeled and left TTA prosthesis Level of assistance: Min A Gait pattern: step to pattern, decreased step length- Right, decreased stance time- Left, decreased hip/knee flexion- Left, Left hip hike, knee flexed in stance- Left, antalgic, lateral hip instability, trunk flexed, and abducted- Left Comments: excessive BUE weight bearing.   FUNCTIONAL TESTs:  Sharlene Motts Balance Scale: 14/56   CARDIOVASCULAR RESPONSE: Functional activity: gait & standing balance Pre-activity vitals: HR: 65 SpO2: 100% Post-activity vitals: HR: 77 SpO2: 96%  CURRENT PROSTHETIC WEAR ASSESSMENT: Evaluation / 09/12/2022:  Patient is dependent with: skin check, residual limb care, care of non-amputated limb, prosthetic cleaning, ply sock cleaning, correct ply sock adjustment, proper wear schedule/adjustment, and proper weight-bearing schedule/adjustment Donning prosthesis: Min A Doffing prosthesis: SBA Prosthetic wear  tolerance: 1 hours, 2x/day, 3 days for first 3 of 7 days since delivery but stopped due to skin issue.  Prosthetic weight bearing tolerance: 5 minutes PWB LLE with prosthesis limb pain 5/10 Edema: pitting Residual limb condition: water blisters 17mm x 3 mm distal limb,  pt & wife report incision at distal tibia cracked open with prosthesis wear for first 3 days.  No hair growth, shiny skin, pale color, normal temperature & moisture.  Cylindrical shape.  Prosthetic description: silicon liner with pin lock, secondary interface pelite liner, flexible keel foot.     TODAY'S TREATMENT:                                                                                                                             DATE:  10/23/2022: Prosthetic Training with TTA prosthesis: No skin issues. PT recommended increasing wear to all awake hours and drying limb/liner half way or if notices sweating. Don upon arising & doffe with evening bath.  If staying up >30-60 minutes after bath, then redonne with 2nd liner. Fall risk is higher when prosthesis is off now that he is subconsciously trusting it. Pt verbalized understanding. Patient ambulated 450' x 2 with cane stand-alone tip with minA/CGA.  Worked on maintaining path with scanning, ambulating 3 steps with eyes closed to simulate walking in the dark and caring 10 pound kettle bell. 4-square stepping with cane 3 reps each clockwise and counterclockwise with minA and 1 rep both directions caring 10 pound kettle bell. Patient negotiated ramp and curb with cane with minA and verbal cues on technique. Stairs 11 steps for flight: Step to pattern utilizing prosthesis with cane and single rail; alternating pattern with 2 rails.  Both with contact-guard assistance and verbal/demo and cues on technique.    10/18/2022: Prosthetic Training with TTA prosthesis: PT educated patient on use of cut off sock under upper liner to decrease skin irritation.  PT demo verbal cues on using  cutoff socks to adjust ply socks more distally with limb shrinkage than proximally in  knee area that shrinks less.  Patient verbalized understanding and reports weight distribution within socket felt better with cut off socks and full length socks. Patient questioning squeak in prosthesis.  Appears to be that enter sock inside prosthetic shell and foot is not covering metal keel fully.  This requires a special told by prosthetists to fix.  patient is aware that he needs to see prosthetist. Patient ambulated 150 feet with cane stand-alone tip safely.  Patient appears safe to use cane inside his home at this time. Patient negotiated ramp and curb with cane with PT demo and verbal cues on technique with intermittent min assist for balance.  PT directed and proper hand-held assist if he has an adult that is capable of assisting him in the community.  Patient does not appear safe to use cane on ramp and curb at this time. PT instructed patient in walking program with short, medium and [place] walk to build endurance and stamina.  See HEP below. Patient negotiated flight of 11 steps with single rail and cane switching side of rail half way.  PT gave verbal cues to improve / correct technique.      10/16/2022: Prosthetic Training with TTA prosthesis: He reports cleaning properly. Pt independently, correctly donned prosthesis.  He is wearing prosthesis 8-10 hours per day.  PT recommended donning upon arising and doffing with bathing (1-2 hours prior to bedtime). Remove for 1-2 hours midday. Pt verbalized understanding.  PT instructed in signs of sweating & management. Pt verbalized understanding. Pt amb 450' with cane working on scanning (right/left, up/down & diagonals) & maintaining path/pace.  PT demo & verbal cues on stepping over obstacles to maintain line of sight of prosthesis to ensure clearing obstacle.  Picking up obstacle from floor. PT demo & verbal cues on technique & set up to practice at home. 5  reps.  Pt verbalized understanding for HEP.  Navigated up/down 6 inch curb step with SPC and minA/CGA Navigated up/down ramp with SPC and minA/CGA cues for technique   Neuromuscular Re-education: Stepping red theraband - abd, flex, add & ext - with cane & minA 10 reps ea direction BLEs.    10/12/2022: Prosthetic Training with TTA prosthesis: Gait up/down one flight of stairs with SPC and one one handrail, supervision Gait stepping over ankle weights as obstacles 6 in total X 6 reps with CGA Picking up bottle off floor and placing back down with cues and demo for technique X 5 Gait with SPC 30 feet X 4 with head turns and CGA Gait with SPC 30 feet X 4 with head nods and CGA Navigated up/down 6 inch curb step with SPC and CGA Navigated up/down ramp with SPC and CGA cues for technique Walking at countertop without UE support with hand hover over counter forward walking 3 round trips and sidestepping 3 round trips. Standing alternating UE rows blue X 15 with CGA Standing shoulder extensions blue X 15 bilat with CGA Standing alternating chest press with blue X 15 with CGA  Threx: Nu Step L7 (first 4 min) then L6 last 2 min for 6 min total, seat #8, LE only    PATIENT EDUCATION: PATIENT EDUCATED ON FOLLOWING PROSTHETIC CARE: Education details:  Skin check, Residual limb care, Prosthetic cleaning, Correct ply sock adjustment, Propper donning, and Proper wear schedule/adjustment sit with prosthetic heel touching ground / not dangling and Prosthetic wear tolerance: 1 hours 2x/day, daily with plan to increase weekly with no skin issues.   Person educated: Patient and  Spouse Education method: Explanation, Demonstration, Tactile cues, and Verbal cues Education comprehension: verbalized understanding, returned demonstration, verbal cues required, tactile cues required, and needs further education  HOME EXERCISE PROGRAM: 09/25/22 Do each exercise 1-2  times per day Do each exercise 5-10  repetitions Hold each exercise for 2 seconds to feel your location  AT SINK FIND YOUR MIDLINE POSITION AND PLACE FEET EQUAL DISTANCE FROM THE MIDLINE.  Try to find this position when standing still for activities.   USE TAPE ON FLOOR TO MARK THE MIDLINE POSITION which is even with middle of sink.  You also should try to feel with your limb pressure in socket.  You are trying to feel with limb what you used to feel with the bottom of your foot.  Side to Side Shift: Moving your hips only (not shoulders): move weight onto your left leg, HOLD/FEEL pressure in socket.  Move back to equal weight on each leg, HOLD/FEEL pressure in socket. Move weight onto your right leg, HOLD/FEEL pressure in socket. Move back to equal weight on each leg, HOLD/FEEL pressure in socket. Repeat.  Start with both hands on sink, progress to hand on prosthetic side only, then no hands.  Front to Back Shift: Moving your hips only (not shoulders): move your weight forward onto your toes, HOLD/FEEL pressure in socket. Move your weight back to equal Flat Foot on both legs, HOLD/FEEL  pressure in socket. Move your weight back onto your heels, HOLD/FEEL  pressure in socket. Move your weight back to equal on both legs, HOLD/FEEL  pressure in socket. Repeat.  Start with both hands on sink, progress to hand on prosthetic side only, then no hands.  Moving Cones / Cups: With equal weight on each leg: Hold on with one hand the first time, then progress to no hand supports. Move cups from one side of sink to the other. Place cups ~2" out of your reach, progress to 10" beyond reach.  Place one hand in middle of sink and reach with other hand. Do both arms.  Then hover one hand and move cups with other hand.  Overhead/Upward Reaching: alternated reaching up to top cabinets or ceiling if no cabinets present. Keep equal weight on each leg. Start with one hand support on counter while other hand reaches and progress to no hand support with reaching.   ace one hand in middle of sink and reach with other hand. Do both arms.  Then hover one hand and move cups with other hand.  5.   Looking Over Shoulders: With equal weight on each leg: alternate turning to look over your shoulders with one hand support on counter as needed.  Start with head motions only to look in front of shoulder, then even with shoulder and progress to looking behind you. To look to side, move head /eyes, then shoulder on side looking pulls back, shift more weight to side looking and pull hip back. Place one hand in middle of sink and let go with other hand so your shoulder can pull back. Switch hands to look other way.   Then hover one hand and look over shoulder. If looking right, use left hand at sink. If looking left, use right hand at sink. 6.  Stepping with leg that is not amputated:  Move items under cabinet out of your way. Shift your hips/pelvis so weight on prosthesis. Tighten muscles in hip on prosthetic side.  SLOWLY step other leg so front of foot is in cabinet. Then step  back to floor.   Walking Program 4-7 days/week Use rolling walker. Have a resting area close.  See levels below. Progress to next level when current level is not challenge or difficult.  Start walking 5 min, rest 5 min for 2 sets Add 3rd set. So you are walking total of 15 min.  Decrease rest to 4 min. Walk 5 min for 3 sets Walk 6 min, rest 4 min for 3 sets. Decrease rest to 3 min. Walking 6 min for 3 sets Walk 7 min, rest 3 min, 3 sets Walk 10 min, rest 5 min 2 sets Walk 11 min, rest 5 min, 2 sets Walk 12 min, rest 5 min, 2 sets Walk 13 min, rest 5 min, 2 sets Walk 14 min, rest 5 min, 2 sets Walk 15 min, rest 5 min, 2 sets Walk 15 min, rest 4 min, 2 sets Walk 15 min, rest 3 min, 2 sets Walk 15 min, rest 2 min, 2 sets Walk 15 min, rest 1 min, 2 sets Walk 30 min  Putt walk see above.  Short distances which is walking from one room to another. Work to increase frequency back to prior level.  Try with cane.    Medium distances is distance between short & [place]. (More than room to room but not maximum tolerated distance). You may be entering & exiting your home or community with limited distances. Goal is 4 medium walks which may going to one place in community.    ASSESSMENT:  CLINICAL IMPRESSION: Patient patient is tolerating the longer wear without skin or limb pain issues and appears ready to increase wear to all awake hours.  PT session focused on functional balance ambulating with cane.  He improved with PT instructions and repetition.  Pt continues to benefit from skilled PT.   OBJECTIVE IMPAIRMENTS: Abnormal gait, cardiopulmonary status limiting activity, decreased activity tolerance, decreased balance, decreased endurance, decreased knowledge of condition, decreased knowledge of use of DME, decreased mobility, difficulty walking, decreased strength, increased edema, prosthetic dependency , obesity, and pain.   ACTIVITY LIMITATIONS: carrying, lifting, bending, standing, stairs, transfers, and locomotion level  PARTICIPATION LIMITATIONS: cleaning, driving, and community activity  PERSONAL FACTORS: Fitness, Time since onset of injury/illness/exacerbation, and 3+ comorbidities: see PMH  are also affecting patient's functional outcome.   REHAB POTENTIAL: Good  CLINICAL DECISION MAKING: Evolving/moderate complexity  EVALUATION COMPLEXITY: Moderate   GOALS: Goals reviewed with patient? Yes  SHORT TERM GOALS: Target date: 10/13/2022  Patient donnes prosthesis modified independent & verbalizes proper cleaning. Baseline: SEE OBJECTIVE DATA Goal status: MET 10/16/2022 2.  Patient tolerates prosthesis >6 hrs total /day without skin issues or limb pain <3/10 after standing. Baseline: SEE OBJECTIVE DATA Goal status:  MET 10/16/2022  3.  Patient able to reach 7" and look over both shoulders without UE support with supervision. Baseline: SEE OBJECTIVE DATA Goal status:  MET  10/16/2022  4. Patient ambulates 23' with RW & prosthesis with supervision. Baseline: SEE OBJECTIVE DATA Goal status:  MET 10/16/2022  5. Patient negotiates ramps & curbs with RW & prosthesis with minA. Baseline: SEE OBJECTIVE DATA Goal status:  MET 10/16/2022  [place] TERM GOALS: Target date: 11/10/2022  Patient demonstrates & verbalized understanding of prosthetic care to enable safe utilization of prosthesis. Baseline: SEE OBJECTIVE DATA Goal status: ongoing 09/20/2022  Patient tolerates prosthesis wear >90% of awake hours without skin or limb pain issues. Baseline: SEE OBJECTIVE DATA Goal status: ongoing 09/20/2022  Berg Balance score >/= 36/56 to indicate lower fall  risk Baseline: SEE OBJECTIVE DATA Goal status: ongoing 09/20/2022  Patient ambulates >300' with RW / rollator walker & prosthesis modified independently Baseline: SEE OBJECTIVE DATA Goal status: ongoing 09/20/2022  Patient negotiates ramps, curbs with RW / rollator walker & prosthesis modified independently. Baseline: SEE OBJECTIVE DATA Goal status: ongoing 09/20/2022   PLAN:  PT FREQUENCY: 2x/week  PT DURATION: 8 weeks  PLANNED INTERVENTIONS: Therapeutic exercises, Therapeutic activity, Neuromuscular re-education, Balance training, Gait training, Patient/Family education, Self Care, Stair training, Vestibular training, Prosthetic training, DME instructions, and physical performance testing  PLAN FOR NEXT SESSION: Check out increasing wear to all awake hours is going, community based gait activities with SPC including ramp/curb, continue to work on stairs and balance   Vladimir Faster, PT, [name] 10/23/2022, 4:04 PM

## 2022-10-24 ENCOUNTER — Other Ambulatory Visit: Payer: Self-pay | Admitting: Family Medicine

## 2022-10-24 DIAGNOSIS — E039 Hypothyroidism, unspecified: Secondary | ICD-10-CM

## 2022-10-25 ENCOUNTER — Encounter: Payer: Self-pay | Admitting: Physical Therapy

## 2022-10-25 ENCOUNTER — Ambulatory Visit (INDEPENDENT_AMBULATORY_CARE_PROVIDER_SITE_OTHER): Payer: No Typology Code available for payment source | Admitting: Physical Therapy

## 2022-10-25 DIAGNOSIS — R2681 Unsteadiness on feet: Secondary | ICD-10-CM

## 2022-10-25 DIAGNOSIS — R2689 Other abnormalities of gait and mobility: Secondary | ICD-10-CM | POA: Diagnosis not present

## 2022-10-25 DIAGNOSIS — L98498 Non-pressure chronic ulcer of skin of other sites with other specified severity: Secondary | ICD-10-CM | POA: Diagnosis not present

## 2022-10-25 DIAGNOSIS — M79662 Pain in left lower leg: Secondary | ICD-10-CM | POA: Diagnosis not present

## 2022-10-25 DIAGNOSIS — Z7409 Other reduced mobility: Secondary | ICD-10-CM

## 2022-10-25 DIAGNOSIS — M6281 Muscle weakness (generalized): Secondary | ICD-10-CM

## 2022-10-25 NOTE — Therapy (Signed)
OUTPATIENT PHYSICAL THERAPY PROSTHETIC TREATMENT   Patient Name: [name] MRN: [id number] DOB:1955/11/26, 67 y.o., male Today's Date: 10/25/2022  PCP: Nelwyn Salisbury, MD REFERRING PROVIDER: Sheppard Plumber, NP  END OF SESSION:  PT End of Session - 10/25/22 1558     Visit Number 13    Number of Visits 17    Date for PT Re-Evaluation 11/10/22    Authorization Type [employer]    Authorization Time Period 20% co-insurance, 89 PT visits remaining    Authorization - Number of Visits 89    Progress Note Due on Visit 20    PT Start Time 1558    PT Stop Time 1640    PT Time Calculation (min) 42 min    Equipment Utilized During Treatment Gait belt    Activity Tolerance Patient tolerated treatment well    Behavior During Therapy WFL for tasks assessed/performed                   Past Medical History:  Diagnosis Date   CAD (coronary artery disease)    a. 05/2022 Cath: LM nl, LAD 38m/2m, 70d, D3 90, LCX 17m, 60d, OM1 sev diff dzs, OM2 85p, OM2 subbranch sev diff dzs, RCA 70p/m, 31m.   CHF (congestive heart failure) (HCC)    Chronic HFrEF (heart failure with reduced ejection fraction) (HCC)    a. 05/2022 Echo: EF 25-30%, mild LVH, GrII DD, mid-apical, anteroseptal, and apical HK. Nl RV fxn. RVSP 51.47mmHg. Mildly dil LA. Mod MR/TR. Mild AS/AI.   Diabetes mellitus    Hematospermia    Hypertension    Ischemic cardiomyopathy    a. 05/2022 Echo: EF 25-30%.   Moderate Mitral regurgitation    Moderate tricuspid regurgitation    Nephrolithiasis 02/07/2007   PAD (peripheral artery disease) (HCC)    a. 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   Viral meningitis    Past Surgical History:  Procedure Laterality Date   AMPUTATION Left 05/19/2022   Procedure: AMPUTATION BELOW KNEE;  Surgeon: Renford Dills, MD;  Location: [facility];  Service: Vascular;  Laterality: Left;   CHOLECYSTECTOMY  12-07-14   laparoscopic, per Dr. Rayburn Ma    LOWER EXTREMITY ANGIOGRAPHY Left 05/16/2022   Procedure: Lower Extremity  Angiography;  Surgeon: Renford Dills, MD;  Location: [facility];  Service: Cardiovascular;  Laterality: Left;   RIGHT/LEFT HEART CATH AND CORONARY ANGIOGRAPHY N/A 06/02/2022   Procedure: RIGHT/LEFT HEART CATH AND CORONARY ANGIOGRAPHY;  Surgeon: Yvonne Kendall, MD;  Location: [facility];  Service: Cardiovascular;  Laterality: N/A;   Patient Active Problem List   Diagnosis Date Noted   Atherosclerosis of native arteries of extremity with intermittent claudication (HCC) 10/06/2022   Pneumonia of both upper lobes due to infectious organism 06/14/2022   Dilated cardiomyopathy (HCC) 06/04/2022   Coronary artery disease of native artery of native heart with stable angina pectoris (HCC) 06/03/2022   Ischemic cardiomyopathy 06/03/2022   PAD (peripheral artery disease) (HCC) 06/02/2022   Diabetes mellitus type 2 with complications (HCC) 06/02/2022   Elevated troponin 06/02/2022   HFrEF (heart failure with reduced ejection fraction) (HCC) 05/31/2022   Demand ischemia 05/27/2022   Controlled type 2 diabetes mellitus with hyperglycemia, with Kawasaki-term current use of insulin (HCC) 05/27/2022   Anemia 05/27/2022   Acute on chronic HFrEF (heart failure with reduced ejection fraction) (HCC) 05/27/2022   Hx of BKA, left (HCC) 05/27/2022   Open displaced avulsion fracture of tuberosity of left calcaneus 05/23/2022   AMS (  altered mental status) 05/21/2022   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 due to underlying condition (HCC) 05/17/2022   Hypertensive urgency 05/16/2022   Gangrene of left foot (HCC) 05/16/2022   Pyogenic inflammation of bone (HCC) 05/16/2022   Diabetic retinopathy associated with type 2 diabetes mellitus (HCC) 01/02/2022   Hypothyroidism 08/23/2021   Type 2 diabetes mellitus with diabetic neuropathy, unspecified (HCC) 04/16/2020   Cataracts, bilateral 04/16/2020   Sinus tachycardia 01/14/2020   Insomnia 01/07/2020   Neuropathy due to  type 2 diabetes mellitus (HCC) 07/27/2017   Generalized anxiety disorder 07/27/2017   Chronic right flank pain 12/20/2015   Hyperkalemia 10/21/2014   Chest pain 09/21/2014   ADHD 06/22/2010   ERECTILE DYSFUNCTION, ORGANIC 06/22/2010   MORBID OBESITY 08/04/2008   NEPHROLITHIASIS, HX OF 03/22/2007   Essential hypertension 02/22/2007    ONSET DATE: prosthesis delivery 08/29/2022  REFERRING DIAG: Z89.519 (ICD-10-CM) - Hx of BKA   THERAPY DIAG:  Other abnormalities of gait and mobility  Unsteadiness on feet  Non-pressure chronic ulcer of skin of other sites with other specified severity (HCC)  Pain in left lower leg  Impaired functional mobility and activity tolerance  Muscle weakness (generalized)  Rationale for Evaluation and Treatment: Rehabilitation  SUBJECTIVE:   SUBJECTIVE STATEMENT: He denies pain upon arrival or any issues with prosthesis    PERTINENT HISTORY: HTN, IDDM2, diabetic neuropathy, PVD, hypothyroidism, viral meningitis  PAIN:  Are you having pain? No   PRECAUTIONS: Fall  WEIGHT BEARING RESTRICTIONS: No  FALLS: Has patient fallen in last 6 months? No  LIVING ENVIRONMENT: Lives with: lives with their spouse Lives in: House  Home Access: Ramped entrance primary or option of stairs Home layout: One level Stairs: Yes: External: 4-5 steps; bilateral but cannot reach both Has following equipment at home: Single point cane, Walker - 4 wheeled, Wheelchair (manual), Shower bench, bed side commode, Grab bars, and Ramped entry  PLOF: Independent, Independent with household mobility with device, and Independent with community mobility with device    PATIENT GOALS:  to use prosthesis to be mobile to enable most appropriate cardiac surgery.   OBJECTIVE:  COGNITION: Overall cognitive status: Within functional limits for tasks assessed  POSTURE: rounded shoulders, forward head, flexed trunk , and weight shift right  LOWER EXTREMITY ROM:  ROM P:passive   A:active Right eval Left eval  Hip flexion    Hip extension    Hip abduction    Hip adduction    Hip internal rotation    Hip external rotation    Knee flexion    Knee extension    Ankle dorsiflexion    Ankle plantarflexion    Ankle inversion    Ankle eversion     (Blank rows = not tested)  LOWER EXTREMITY MMT:  MMT Right eval Left eval  Hip flexion    Hip extension    Hip abduction    Hip adduction    Hip internal rotation    Hip external rotation    Knee flexion    Knee extension    Ankle dorsiflexion    Ankle plantarflexion    Ankle inversion    Ankle eversion    (Blank rows = not tested)  TRANSFERS: Sit to stand: SBA 22" w/c using armrests using back of legs against w/c to stabilize and touch on wall to left.   Stand to sit: SBA RW to 22" w/c using armrests   GAIT: Distance walked: 25' Assistive device utilized: Environmental consultant - 2  wheeled and left TTA prosthesis Level of assistance: Min A Gait pattern: step to pattern, decreased step length- Right, decreased stance time- Left, decreased hip/knee flexion- Left, Left hip hike, knee flexed in stance- Left, antalgic, lateral hip instability, trunk flexed, and abducted- Left Comments: excessive BUE weight bearing.   FUNCTIONAL TESTs:  Sharlene Motts Balance Scale: 14/56   CARDIOVASCULAR RESPONSE: Functional activity: gait & standing balance Pre-activity vitals: HR: 65 SpO2: 100% Post-activity vitals: HR: 77 SpO2: 96%  CURRENT PROSTHETIC WEAR ASSESSMENT: Evaluation / 09/12/2022:  Patient is dependent with: skin check, residual limb care, care of non-amputated limb, prosthetic cleaning, ply sock cleaning, correct ply sock adjustment, proper wear schedule/adjustment, and proper weight-bearing schedule/adjustment Donning prosthesis: Min A Doffing prosthesis: SBA Prosthetic wear tolerance: 1 hours, 2x/day, 3 days for first 3 of 7 days since delivery but stopped due to skin issue.  Prosthetic weight bearing tolerance: 5  minutes PWB LLE with prosthesis limb pain 5/10 Edema: pitting Residual limb condition: water blisters 17mm x 3 mm distal limb,  pt & wife report incision at distal tibia cracked open with prosthesis wear for first 3 days.  No hair growth, shiny skin, pale color, normal temperature & moisture.  Cylindrical shape.  Prosthetic description: silicon liner with pin lock, secondary interface pelite liner, flexible keel foot.     TODAY'S TREATMENT:                                                                                                                             DATE:  10/25/2022: Prosthetic Training with TTA prosthesis: No skin issues to report with wearing prosthesis all awake hours. Walking without assistance with hands hovering over bars only using them PRN X 3 round trips forward, 3 round trips lateral, 3 round trips backwards Sidestepping with bilat UE support in bars on foam beam X 4 round trips Stepping over yoga block with cane and CGA X 10 Gait with SPC 30 feet X 4 with head turns and CGA Gait with SPC 30 feet X 4 with head nods and CGA Gait with SPC 30 feet X 4 with 3 steps eyes open and 5 steps eyes closed with SPC and CGA. Stairs 11 steps for flight: Step to pattern utilizing prosthesis with cane and single rail; with supervision Patient negotiated ramp and 6.5 inch curb with cane with CGA  X1 each Leg Press DL 295# [id number], then Left leg only 50# 2X15 Nu step Legs only L7 X 4 minutes for functional endurance/leg strength   10/23/2022: Prosthetic Training with TTA prosthesis: No skin issues. PT recommended increasing wear to all awake hours and drying limb/liner half way or if notices sweating. Don upon arising & doffe with evening bath.  If staying up >30-60 minutes after bath, then redonne with 2nd liner. Fall risk is higher when prosthesis is off now that he is subconsciously trusting it. Pt verbalized understanding. Patient ambulated 450' x 2 with cane stand-alone tip with  minA/CGA.  Worked on maintaining path with scanning, ambulating 3 steps with eyes closed to simulate walking in the dark and caring 10 pound kettle bell. 4-square stepping with cane 3 reps each clockwise and counterclockwise with minA and 1 rep both directions caring 10 pound kettle bell. Patient negotiated ramp and curb with cane with minA and verbal cues on technique. Stairs 11 steps for flight: Step to pattern utilizing prosthesis with cane and single rail; alternating pattern with 2 rails.  Both with contact-guard assistance and verbal/demo and cues on technique.    10/18/2022: Prosthetic Training with TTA prosthesis: PT educated patient on use of cut off sock under upper liner to decrease skin irritation.  PT demo verbal cues on using cutoff socks to adjust ply socks more distally with limb shrinkage than proximally in knee area that shrinks less.  Patient verbalized understanding and reports weight distribution within socket felt better with cut off socks and full length socks. Patient questioning squeak in prosthesis.  Appears to be that enter sock inside prosthetic shell and foot is not covering metal keel fully.  This requires a special told by prosthetists to fix.  patient is aware that he needs to see prosthetist. Patient ambulated 150 feet with cane stand-alone tip safely.  Patient appears safe to use cane inside his home at this time. Patient negotiated ramp and curb with cane with PT demo and verbal cues on technique with intermittent min assist for balance.  PT directed and proper hand-held assist if he has an adult that is capable of assisting him in the community.  Patient does not appear safe to use cane on ramp and curb at this time. PT instructed patient in walking program with short, medium and Selle walk to build endurance and stamina.  See HEP below. Patient negotiated flight of 11 steps with single rail and cane switching side of rail half way.  PT gave verbal cues to improve /  correct technique.      10/16/2022: Prosthetic Training with TTA prosthesis: He reports cleaning properly. Pt independently, correctly donned prosthesis.  He is wearing prosthesis 8-10 hours per day.  PT recommended donning upon arising and doffing with bathing (1-2 hours prior to bedtime). Remove for 1-2 hours midday. Pt verbalized understanding.  PT instructed in signs of sweating & management. Pt verbalized understanding. Pt amb 450' with cane working on scanning (right/left, up/down & diagonals) & maintaining path/pace.  PT demo & verbal cues on stepping over obstacles to maintain line of sight of prosthesis to ensure clearing obstacle.  Picking up obstacle from floor. PT demo & verbal cues on technique & set up to practice at home. 5 reps.  Pt verbalized understanding for HEP.  Navigated up/down 6 inch curb step with SPC and minA/CGA Navigated up/down ramp with SPC and minA/CGA cues for technique   Neuromuscular Re-education: Stepping red theraband - abd, flex, add & ext - with cane & minA 10 reps ea direction BLEs.    10/12/2022: Prosthetic Training with TTA prosthesis: Gait up/down one flight of stairs with SPC and one one handrail, supervision Gait stepping over ankle weights as obstacles 6 in total X 6 reps with CGA Picking up bottle off floor and placing back down with cues and demo for technique X 5 Gait with SPC 30 feet X 4 with head turns and CGA Gait with SPC 30 feet X 4 with head nods and CGA Navigated up/down 6 inch curb step with SPC and CGA Navigated up/down ramp with SPC and  CGA cues for technique Walking at countertop without UE support with hand hover over counter forward walking 3 round trips and sidestepping 3 round trips. Standing alternating UE rows blue X 15 with CGA Standing shoulder extensions blue X 15 bilat with CGA Standing alternating chest press with blue X 15 with CGA  Threx: Nu Step L7 (first 4 min) then L6 last 2 min for 6 min total, seat #8, LE  only    PATIENT EDUCATION: PATIENT EDUCATED ON FOLLOWING PROSTHETIC CARE: Education details:  Skin check, Residual limb care, Prosthetic cleaning, Correct ply sock adjustment, Propper donning, and Proper wear schedule/adjustment sit with prosthetic heel touching ground / not dangling and Prosthetic wear tolerance: 1 hours 2x/day, daily with plan to increase weekly with no skin issues.   Person educated: Patient and Spouse Education method: Explanation, Demonstration, Tactile cues, and Verbal cues Education comprehension: verbalized understanding, returned demonstration, verbal cues required, tactile cues required, and needs further education  HOME EXERCISE PROGRAM: 09/25/22 Do each exercise 1-2  times per day Do each exercise 5-10 repetitions Hold each exercise for 2 seconds to feel your location  AT SINK FIND YOUR MIDLINE POSITION AND PLACE FEET EQUAL DISTANCE FROM THE MIDLINE.  Try to find this position when standing still for activities.   USE TAPE ON FLOOR TO MARK THE MIDLINE POSITION which is even with middle of sink.  You also should try to feel with your limb pressure in socket.  You are trying to feel with limb what you used to feel with the bottom of your foot.  Side to Side Shift: Moving your hips only (not shoulders): move weight onto your left leg, HOLD/FEEL pressure in socket.  Move back to equal weight on each leg, HOLD/FEEL pressure in socket. Move weight onto your right leg, HOLD/FEEL pressure in socket. Move back to equal weight on each leg, HOLD/FEEL pressure in socket. Repeat.  Start with both hands on sink, progress to hand on prosthetic side only, then no hands.  Front to Back Shift: Moving your hips only (not shoulders): move your weight forward onto your toes, HOLD/FEEL pressure in socket. Move your weight back to equal Flat Foot on both legs, HOLD/FEEL  pressure in socket. Move your weight back onto your heels, HOLD/FEEL  pressure in socket. Move your weight back to equal  on both legs, HOLD/FEEL  pressure in socket. Repeat.  Start with both hands on sink, progress to hand on prosthetic side only, then no hands.  Moving Cones / Cups: With equal weight on each leg: Hold on with one hand the first time, then progress to no hand supports. Move cups from one side of sink to the other. Place cups ~2" out of your reach, progress to 10" beyond reach.  Place one hand in middle of sink and reach with other hand. Do both arms.  Then hover one hand and move cups with other hand.  Overhead/Upward Reaching: alternated reaching up to top cabinets or ceiling if no cabinets present. Keep equal weight on each leg. Start with one hand support on counter while other hand reaches and progress to no hand support with reaching.  ace one hand in middle of sink and reach with other hand. Do both arms.  Then hover one hand and move cups with other hand.  5.   Looking Over Shoulders: With equal weight on each leg: alternate turning to look over your shoulders with one hand support on counter as needed.  Start with head motions only  to look in front of shoulder, then even with shoulder and progress to looking behind you. To look to side, move head /eyes, then shoulder on side looking pulls back, shift more weight to side looking and pull hip back. Place one hand in middle of sink and let go with other hand so your shoulder can pull back. Switch hands to look other way.   Then hover one hand and look over shoulder. If looking right, use left hand at sink. If looking left, use right hand at sink. 6.  Stepping with leg that is not amputated:  Move items under cabinet out of your way. Shift your hips/pelvis so weight on prosthesis. Tighten muscles in hip on prosthetic side.  SLOWLY step other leg so front of foot is in cabinet. Then step back to floor.   Walking Program 4-7 days/week Use rolling walker. Have a resting area close.  See levels below. Progress to next level when current level is not challenge  or difficult.  Start walking 5 min, rest 5 min for 2 sets Add 3rd set. So you are walking total of 15 min.  Decrease rest to 4 min. Walk 5 min for 3 sets Walk 6 min, rest 4 min for 3 sets. Decrease rest to 3 min. Walking 6 min for 3 sets Walk 7 min, rest 3 min, 3 sets Walk 10 min, rest 5 min 2 sets Walk 11 min, rest 5 min, 2 sets Walk 12 min, rest 5 min, 2 sets Walk 13 min, rest 5 min, 2 sets Walk 14 min, rest 5 min, 2 sets Walk 15 min, rest 5 min, 2 sets Walk 15 min, rest 4 min, 2 sets Walk 15 min, rest 3 min, 2 sets Walk 15 min, rest 2 min, 2 sets Walk 15 min, rest 1 min, 2 sets Walk 30 min  Zehner walk see above.  Short distances which is walking from one room to another. Work to increase frequency back to prior level. Try with cane.    Medium distances is distance between short & [place]. (More than room to room but not maximum tolerated distance). You may be entering & exiting your home or community with limited distances. Goal is 4 medium walks which may going to one place in community.    ASSESSMENT:  CLINICAL IMPRESSION: He is now able to wear prosthesis all awake hours without difficulty. He is making great overall progress with PT and will continue to benefit from skilled PT for further prosthetic training and dynamic balance.  OBJECTIVE IMPAIRMENTS: Abnormal gait, cardiopulmonary status limiting activity, decreased activity tolerance, decreased balance, decreased endurance, decreased knowledge of condition, decreased knowledge of use of DME, decreased mobility, difficulty walking, decreased strength, increased edema, prosthetic dependency , obesity, and pain.   ACTIVITY LIMITATIONS: carrying, lifting, bending, standing, stairs, transfers, and locomotion level  PARTICIPATION LIMITATIONS: cleaning, driving, and community activity  PERSONAL FACTORS: Fitness, Time since onset of injury/illness/exacerbation, and 3+ comorbidities: see PMH  are also affecting patient's functional  outcome.   REHAB POTENTIAL: Good  CLINICAL DECISION MAKING: Evolving/moderate complexity  EVALUATION COMPLEXITY: Moderate   GOALS: Goals reviewed with patient? Yes  SHORT TERM GOALS: Target date: 10/13/2022  Patient donnes prosthesis modified independent & verbalizes proper cleaning. Baseline: SEE OBJECTIVE DATA Goal status: MET 10/16/2022 2.  Patient tolerates prosthesis >6 hrs total /day without skin issues or limb pain <3/10 after standing. Baseline: SEE OBJECTIVE DATA Goal status:  MET 10/16/2022  3.  Patient able to reach 7" and look  over both shoulders without UE support with supervision. Baseline: SEE OBJECTIVE DATA Goal status:  MET 10/16/2022  4. Patient ambulates 47' with RW & prosthesis with supervision. Baseline: SEE OBJECTIVE DATA Goal status:  MET 10/16/2022  5. Patient negotiates ramps & curbs with RW & prosthesis with minA. Baseline: SEE OBJECTIVE DATA Goal status:  MET 10/16/2022  Jans TERM GOALS: Target date: 11/10/2022  Patient demonstrates & verbalized understanding of prosthetic care to enable safe utilization of prosthesis. Baseline: SEE OBJECTIVE DATA Goal status: ongoing 09/20/2022  Patient tolerates prosthesis wear >90% of awake hours without skin or limb pain issues. Baseline: SEE OBJECTIVE DATA Goal status: ongoing 09/20/2022  Berg Balance score >/= 36/56 to indicate lower fall risk Baseline: SEE OBJECTIVE DATA Goal status: ongoing 09/20/2022  Patient ambulates >300' with RW / rollator walker & prosthesis modified independently Baseline: SEE OBJECTIVE DATA Goal status: ongoing 09/20/2022  Patient negotiates ramps, curbs with RW / rollator walker & prosthesis modified independently. Baseline: SEE OBJECTIVE DATA Goal status: ongoing 09/20/2022   PLAN:  PT FREQUENCY: 2x/week  PT DURATION: 8 weeks  PLANNED INTERVENTIONS: Therapeutic exercises, Therapeutic activity, Neuromuscular re-education, Balance training, Gait training, Patient/Family  education, Self Care, Stair training, Vestibular training, Prosthetic training, DME instructions, and physical performance testing  PLAN FOR NEXT SESSION: Continue with community based gait activities with SPC including ramp/curb, continue to work on stairs and balance   April Manson, PT, [name] 10/25/2022, 3:59 PM

## 2022-10-29 NOTE — Progress Notes (Unsigned)
[address]       [place] [address]             [phone]           [facility] Medical Record #[id number] Date of Birth: 29-Apr-1956  Donald Siva Nelwyn Salisbury, MD  Chief Complaint:  CAD  History of Present Illness:     Pt is a 67 yo male with significant PVD who was seen by my partner Dr Delia Chimes for evaluation of CAD. (Please see that consult). Pt was felt to be a poor surgical candidate secondary to poor LV function, poor targets, PVD and CRI. Pt was sent for MRI to determine vialbility and it returned now as normal LV function. I have been asked to assess for surgical revasuclarization. Pt feels well with no CP or DOE. He has been ambulating with new prosthesis and feels he will be released from using walker soon.     Past Medical History:  Diagnosis Date   CAD (coronary artery disease)    a. 05/2022 Cath: LM nl, LAD 86m/57m, 70d, D3 90, LCX 57m, 60d, OM1 sev diff dzs, OM2 85p, OM2 subbranch sev diff dzs, RCA 70p/m, 59m.   CHF (congestive heart failure) (HCC)    Chronic HFrEF (heart failure with reduced ejection fraction) (HCC)    a. 05/2022 Echo: EF 25-30%, mild LVH, GrII DD, mid-apical, anteroseptal, and apical HK. Nl RV fxn. RVSP 51.69mmHg. Mildly dil LA. Mod MR/TR. Mild AS/AI.   Diabetes mellitus    Hematospermia    Hypertension    Ischemic cardiomyopathy    a. 05/2022 Echo: EF 25-30%.   Moderate Mitral regurgitation    Moderate tricuspid regurgitation    Nephrolithiasis 02/07/2007   PAD (peripheral artery disease) (HCC)    a. 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   Viral meningitis     Past Surgical History:  Procedure Laterality Date   AMPUTATION Left 05/19/2022   Procedure: AMPUTATION BELOW KNEE;  Surgeon: Renford Dills, MD;  Location: [facility];  Service: Vascular;  Laterality: Left;   CHOLECYSTECTOMY  12-07-14   laparoscopic, per Dr. Rayburn Ma    LOWER EXTREMITY ANGIOGRAPHY Left 05/16/2022   Procedure: Lower Extremity Angiography;   Surgeon: Renford Dills, MD;  Location: [facility];  Service: Cardiovascular;  Laterality: Left;   RIGHT/LEFT HEART CATH AND CORONARY ANGIOGRAPHY N/A 06/02/2022   Procedure: RIGHT/LEFT HEART CATH AND CORONARY ANGIOGRAPHY;  Surgeon: Yvonne Kendall, MD;  Location: [facility];  Service: Cardiovascular;  Laterality: N/A;    Social History   Tobacco Use  Smoking Status Former  Smokeless Tobacco Never  Tobacco Comments   22 yrs ago     Social History   Substance and Sexual Activity  Alcohol Use No   Alcohol/week: 0.0 standard drinks of alcohol    Social History   Socioeconomic History   Marital status: Married    Spouse name: Not on file   Number of children: 2   Years of education: Not on file   Highest education level: Not on file  Occupational History   Not on file  Tobacco Use   Smoking status: Former   Smokeless tobacco: Never   Tobacco comments:    22 yrs ago   Substance and Sexual Activity   Alcohol use: No    Alcohol/week: 0.0 standard drinks of alcohol   Drug use: No   Sexual activity: Not on file  Other Topics Concern  Not on file  Social History Narrative   Right Handed   Drinks little caffeine - only diet sodas if so   One Story Home    5 Grandchildren   Social Determinants of Health   Financial Resource Strain: Patient Declined (01/01/2022)   Overall Financial Resource Strain (CARDIA)    Difficulty of Paying Living Expenses: Patient declined  Food Insecurity: Patient Declined (05/16/2022)   Hunger Vital Sign    Worried About Running Out of Food in the Last Year: Patient declined    Ran Out of Food in the Last Year: Patient declined  Transportation Needs: Patient Declined (05/16/2022)   PRAPARE - Administrator, Civil Service (Medical): Patient declined    Lack of Transportation (Non-Medical): Patient declined  Physical Activity: Unknown (01/01/2022)   Exercise Vital Sign    Days of Exercise per Week: Patient  declined    Minutes of Exercise per Session: Not on file  Stress: Patient Declined (01/01/2022)   [employer] of Occupational Health - Occupational Stress Questionnaire    Feeling of Stress : Patient declined  Social Connections: Unknown (01/01/2022)   Social Connection and Isolation Panel [NHANES]    Frequency of Communication with Friends and Family: Patient declined    Frequency of Social Gatherings with Friends and Family: Patient declined    Attends Religious Services: Patient declined    Database administrator or Organizations: Patient declined    Attends Banker Meetings: Not on file    Marital Status: Patient declined  Intimate Partner Violence: Not At Risk (05/16/2022)   Humiliation, Afraid, Rape, and Kick questionnaire    Fear of Current or Ex-Partner: No    Emotionally Abused: No    Physically Abused: No    Sexually Abused: No    No Known Allergies  Current Outpatient Medications  Medication Sig Dispense Refill   ACCU-CHEK GUIDE test strip USE TO TEST BLOOD SUGARS TWICE DAILY 200 strip 3   Accu-Chek Softclix Lancets lancets daily.     ALPRAZolam (XANAX) 0.5 MG tablet TAKE 1 TABLET (0.5 MG TOTAL) BY MOUTH 3 (THREE) TIMES DAILY AS NEEDED FOR ANXIETY OR SLEEP. 270 tablet 1   aspirin EC 81 MG tablet Take 1 tablet (81 mg total) by mouth daily. Swallow whole. 30 tablet 12   atorvastatin (LIPITOR) 20 MG tablet Take 1 tablet (20 mg total) by mouth daily. 90 tablet 3   Blood Glucose Monitoring Suppl (ACCU-CHEK GUIDE ME) w/Device KIT USE AS DIRECTED EVERY DAY     clopidogrel (PLAVIX) 75 MG tablet Take 1 tablet (75 mg total) by mouth daily. 30 tablet 3   Continuous Blood Gluc Sensor (DEXCOM G7 SENSOR) MISC See admin instructions.     dapagliflozin propanediol (FARXIGA) 10 MG TABS tablet Take 1 tablet (10 mg total) by mouth daily. 90 tablet 3   insulin isophane & regular human KwikPen (NOVOLIN 70/30 KWIKPEN) (70-30) 100 UNIT/ML KwikPen Inject 25 Units into the skin 2  (two) times daily with a meal. 300 mL 3   Insulin Pen Needle (BD PEN NEEDLE MICRO U/F) 32G X 6 MM MISC USE AS DIRECTED TO INJECT NOVILIN 200 each 1   levothyroxine (SYNTHROID) 50 MCG tablet TAKE 1 TABLET BY MOUTH EVERY DAY 90 tablet 0   levothyroxine (SYNTHROID) 50 MCG tablet TAKE 1 TABLET BY MOUTH EVERY DAY 90 tablet 0   losartan (COZAAR) 25 MG tablet Take 0.5 tablets (12.5 mg total) by mouth daily. 45 tablet 3   Melatonin 10  MG TABS Take 1 tablet by mouth at bedtime.     metoprolol succinate (TOPROL-XL) 25 MG 24 hr tablet Take 0.5 tablets (12.5 mg total) by mouth daily. 45 tablet 3   spironolactone (ALDACTONE) 25 MG tablet Take 1 tablet (25 mg total) by mouth daily. 90 tablet 3   Torsemide 40 MG TABS Take 40 mg by mouth as needed (AS NEEDED for weight gain of 3 pounds overnight or 5 pounds in a week). 90 tablet 3   No current facility-administered medications for this visit.     Family History  Problem Relation Age of Onset   Rheum arthritis Mother    Diabetes Father    Heart disease Father    Cholecystitis Sister    Cholecystitis Brother    Diabetes Brother    Diabetes Brother    Parkinson's disease Brother    Heart disease Sister    Diabetes Sister    Cholecystitis Sister    Diabetes Sister    Arthritis Other    Hypertension Other    Stroke Other    Coronary artery disease Other    Birth defects Neg Hx        Physical Exam: Deferred (see Dr Gwendel Hanson evaluation)     Diagnostic Studies & Laboratory data: I have personally reviewed the following studies and agree with the findings   Cardiac MRI (09/2022) FINDINGS: Limited images of the lung fields showed no gross abnormalities.   Normal left ventricular size with mild concentric LV hypertrophy. Normal wall motion, EF 60%. Normal right ventricular size and systolic function, EF 53%. Normal left atrium. Normal right atrium. Trileaflet aortic valve with no stenosis or regurgitation. No significant mitral  regurgitation.   On delayed enhancement imaging, there was as small area of <50% wall thickness subendocardial myocardial late gadolinium enhancement (LGE) in the apical inferior wall.   MEASUREMENTS: MEASUREMENTS LVEDV 90 mL   LVEDVi 42 mL/m2   LVSV 54 mL   LVEF 60%   RVEDV 85 mL   RVEDVi 40 mL/m2   RVSV 45 mL   RVEF 53%   Aortic forward volume 54 mL   Aortic regurgitant fraction 1%   T1 1022, ECV 28%   IMPRESSION: 1. Normal LV size with mild LV hypertrophy. Normal wall motion, EF 60%.   2.  Normal RV size and systolic function, EF 53%.   3. Small area of < 50% wall thickness late gadolinium enhancement (LGE) at the apical inferior wall. 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 There do not appear to be any nonviable wall segments in the left ventricle.   4.  Normal extracellular volume percentage.    Recent Radiology Findings:       Recent Lab Findings: Lab Results  Component Value Date   WBC 6.7 09/06/2022   HGB 13.9 09/06/2022   HCT 41.3 09/06/2022   PLT 148.0 (L) 09/06/2022   GLUCOSE 125 (H) 09/06/2022   CHOL 93 05/30/2022   TRIG 72 05/30/2022   HDL 28 (L) 05/30/2022   LDLCALC 51 05/30/2022   ALT 43 09/06/2022   AST 37 09/06/2022   NA 139 09/06/2022   K 4.6 09/06/2022   CL 104 09/06/2022   CREATININE 1.47 09/06/2022   BUN 33 (H) 09/06/2022   CO2 26 09/06/2022   TSH 3.15 09/06/2022   INR 1.1 05/27/2022   HGBA1C 6.7 (H) 05/21/2022      Assessment / Plan:     67 yo male with significant CAD with now normal  LV function and asymptomatic. Pt has very poor targets in the circumflex and right territories and LAD best target. However with his multiple comorbidities, I feels that ongoing medical management and possibly LAD stenting best moving forward.  He is to see cardiology next week to review his options.    I have spent 60 min in review of the records, viewing studies and in face to face with patient and in coordination of  future care    Eugenio Hoes 10/29/2022 8:38 PM

## 2022-10-30 ENCOUNTER — Encounter: Payer: No Typology Code available for payment source | Admitting: Physical Therapy

## 2022-10-30 ENCOUNTER — Encounter: Payer: Self-pay | Admitting: Physical Therapy

## 2022-10-30 ENCOUNTER — Encounter: Payer: Self-pay | Admitting: Thoracic Surgery (Cardiothoracic Vascular Surgery)

## 2022-10-30 ENCOUNTER — Ambulatory Visit (INDEPENDENT_AMBULATORY_CARE_PROVIDER_SITE_OTHER): Payer: No Typology Code available for payment source | Admitting: Physical Therapy

## 2022-10-30 ENCOUNTER — Ambulatory Visit (INDEPENDENT_AMBULATORY_CARE_PROVIDER_SITE_OTHER): Payer: No Typology Code available for payment source | Admitting: Thoracic Surgery (Cardiothoracic Vascular Surgery)

## 2022-10-30 VITALS — BP 131/68 | HR 78 | Resp 20 | Ht 68.0 in | Wt 200.0 lb

## 2022-10-30 DIAGNOSIS — M6281 Muscle weakness (generalized): Secondary | ICD-10-CM

## 2022-10-30 DIAGNOSIS — R2689 Other abnormalities of gait and mobility: Secondary | ICD-10-CM

## 2022-10-30 DIAGNOSIS — I340 Nonrheumatic mitral (valve) insufficiency: Secondary | ICD-10-CM | POA: Diagnosis not present

## 2022-10-30 DIAGNOSIS — Z7409 Other reduced mobility: Secondary | ICD-10-CM

## 2022-10-30 DIAGNOSIS — I2510 Atherosclerotic heart disease of native coronary artery without angina pectoris: Secondary | ICD-10-CM | POA: Diagnosis not present

## 2022-10-30 DIAGNOSIS — M79662 Pain in left lower leg: Secondary | ICD-10-CM

## 2022-10-30 DIAGNOSIS — R2681 Unsteadiness on feet: Secondary | ICD-10-CM

## 2022-10-30 DIAGNOSIS — L98498 Non-pressure chronic ulcer of skin of other sites with other specified severity: Secondary | ICD-10-CM | POA: Diagnosis not present

## 2022-10-30 NOTE — Patient Instructions (Signed)
Ongoing medical management

## 2022-10-30 NOTE — Therapy (Signed)
OUTPATIENT PHYSICAL THERAPY PROSTHETIC TREATMENT   Patient Name: [name] MRN: [id number] DOB:Aug 23, 1955, 67 y.o., male Today's Date: 10/30/2022  PCP: Nelwyn Salisbury, MD REFERRING PROVIDER: Sheppard Plumber, NP  END OF SESSION:  PT End of Session - 10/30/22 1425     Visit Number 14    Number of Visits 17    Date for PT Re-Evaluation 11/10/22    Authorization Type [employer]    Authorization Time Period 20% co-insurance, 89 PT visits remaining    Authorization - Number of Visits 89    Progress Note Due on Visit 20    PT Start Time 1345    PT Stop Time 1423    PT Time Calculation (min) 38 min    Equipment Utilized During Treatment Gait belt    Activity Tolerance Patient tolerated treatment well    Behavior During Therapy WFL for tasks assessed/performed                    Past Medical History:  Diagnosis Date   CAD (coronary artery disease)    a. 05/2022 Cath: LM nl, LAD 82m/102m, 70d, D3 90, LCX 16m, 60d, OM1 sev diff dzs, OM2 85p, OM2 subbranch sev diff dzs, RCA 70p/m, 71m.   CHF (congestive heart failure) (HCC)    Chronic HFrEF (heart failure with reduced ejection fraction) (HCC)    a. 05/2022 Echo: EF 25-30%, mild LVH, GrII DD, mid-apical, anteroseptal, and apical HK. Nl RV fxn. RVSP 51.47mmHg. Mildly dil LA. Mod MR/TR. Mild AS/AI.   Diabetes mellitus    Hematospermia    Hypertension    Ischemic cardiomyopathy    a. 05/2022 Echo: EF 25-30%.   Moderate Mitral regurgitation    Moderate tricuspid regurgitation    Nephrolithiasis 02/07/2007   PAD (peripheral artery disease) (HCC)    a. 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   Viral meningitis    Past Surgical History:  Procedure Laterality Date   AMPUTATION Left 05/19/2022   Procedure: AMPUTATION BELOW KNEE;  Surgeon: Renford Dills, MD;  Location: [facility];  Service: Vascular;  Laterality: Left;   CHOLECYSTECTOMY  12-07-14   laparoscopic, per Dr. Rayburn Ma    LOWER EXTREMITY ANGIOGRAPHY Left 05/16/2022   Procedure: Lower  Extremity Angiography;  Surgeon: Renford Dills, MD;  Location: [facility];  Service: Cardiovascular;  Laterality: Left;   RIGHT/LEFT HEART CATH AND CORONARY ANGIOGRAPHY N/A 06/02/2022   Procedure: RIGHT/LEFT HEART CATH AND CORONARY ANGIOGRAPHY;  Surgeon: Yvonne Kendall, MD;  Location: [facility];  Service: Cardiovascular;  Laterality: N/A;   Patient Active Problem List   Diagnosis Date Noted   Atherosclerosis of native arteries of extremity with intermittent claudication (HCC) 10/06/2022   Pneumonia of both upper lobes due to infectious organism 06/14/2022   Dilated cardiomyopathy (HCC) 06/04/2022   Coronary artery disease of native artery of native heart with stable angina pectoris (HCC) 06/03/2022   Ischemic cardiomyopathy 06/03/2022   PAD (peripheral artery disease) (HCC) 06/02/2022   Diabetes mellitus type 2 with complications (HCC) 06/02/2022   Elevated troponin 06/02/2022   HFrEF (heart failure with reduced ejection fraction) (HCC) 05/31/2022   Demand ischemia 05/27/2022   Controlled type 2 diabetes mellitus with hyperglycemia, with Brodersen-term current use of insulin (HCC) 05/27/2022   Anemia 05/27/2022   Acute on chronic HFrEF (heart failure with reduced ejection fraction) (HCC) 05/27/2022   Hx of BKA, left (HCC) 05/27/2022   Open displaced avulsion fracture of tuberosity of left calcaneus 05/23/2022  AMS (altered mental status) 05/21/2022   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 due to underlying condition (HCC) 05/17/2022   Hypertensive urgency 05/16/2022   Gangrene of left foot (HCC) 05/16/2022   Pyogenic inflammation of bone (HCC) 05/16/2022   Diabetic retinopathy associated with type 2 diabetes mellitus (HCC) 01/02/2022   Hypothyroidism 08/23/2021   Type 2 diabetes mellitus with diabetic neuropathy, unspecified (HCC) 04/16/2020   Cataracts, bilateral 04/16/2020   Sinus tachycardia 01/14/2020   Insomnia 01/07/2020    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 2 diabetes mellitus (HCC) 07/27/2017   Generalized anxiety disorder 07/27/2017   Chronic right flank pain 12/20/2015   Hyperkalemia 10/21/2014   Chest pain 09/21/2014   ADHD 06/22/2010   ERECTILE DYSFUNCTION, ORGANIC 06/22/2010   MORBID OBESITY 08/04/2008   NEPHROLITHIASIS, HX OF 03/22/2007   Essential hypertension 02/22/2007    ONSET DATE: prosthesis delivery 08/29/2022  REFERRING DIAG: Z89.519 (ICD-10-CM) - Hx of BKA   THERAPY DIAG:  Other abnormalities of gait and mobility  Unsteadiness on feet  Non-pressure chronic ulcer of skin of other sites with other specified severity (HCC)  Pain in left lower leg  Impaired functional mobility and activity tolerance  Muscle weakness (generalized)  Rationale for Evaluation and Treatment: Rehabilitation  SUBJECTIVE:   SUBJECTIVE STATEMENT: He denies pain, he does still have to play around with adjusting his ply socks, he is wearing 5 ply today all together   PERTINENT HISTORY: HTN, IDDM2, diabetic neuropathy, PVD, hypothyroidism, viral meningitis  PAIN:  Are you having pain? No   PRECAUTIONS: Fall  WEIGHT BEARING RESTRICTIONS: No  FALLS: Has patient fallen in last 6 months? No  LIVING ENVIRONMENT: Lives with: lives with their spouse Lives in: House  Home Access: Ramped entrance primary or option of stairs Home layout: One level Stairs: Yes: External: 4-5 steps; bilateral but cannot reach both Has following equipment at home: Single point cane, Walker - 4 wheeled, Wheelchair (manual), Shower bench, bed side commode, Grab bars, and Ramped entry  PLOF: Independent, Independent with household mobility with device, and Independent with community mobility with device    PATIENT GOALS:  to use prosthesis to be mobile to enable most appropriate cardiac surgery.   OBJECTIVE:  COGNITION: Overall cognitive status: Within functional limits for tasks assessed  POSTURE: rounded shoulders, forward head,  flexed trunk , and weight shift right  LOWER EXTREMITY ROM:  ROM P:passive  A:active Right eval Left eval  Hip flexion    Hip extension    Hip abduction    Hip adduction    Hip internal rotation    Hip external rotation    Knee flexion    Knee extension    Ankle dorsiflexion    Ankle plantarflexion    Ankle inversion    Ankle eversion     (Blank rows = not tested)  LOWER EXTREMITY MMT:  MMT Right eval Left eval  Hip flexion    Hip extension    Hip abduction    Hip adduction    Hip internal rotation    Hip external rotation    Knee flexion    Knee extension    Ankle dorsiflexion    Ankle plantarflexion    Ankle inversion    Ankle eversion    (Blank rows = not tested)  TRANSFERS: Sit to stand: SBA 22" w/c using armrests using back of legs against w/c to stabilize and touch on wall to left.   Stand to sit: SBA RW to 22" w/c using  armrests   GAIT: Distance walked: 25' Assistive device utilized: Environmental consultant - 2 wheeled and left TTA prosthesis Level of assistance: Min A Gait pattern: step to pattern, decreased step length- Right, decreased stance time- Left, decreased hip/knee flexion- Left, Left hip hike, knee flexed in stance- Left, antalgic, lateral hip instability, trunk flexed, and abducted- Left Comments: excessive BUE weight bearing.   FUNCTIONAL TESTs:  Sharlene Motts Balance Scale: 14/56   CARDIOVASCULAR RESPONSE: Functional activity: gait & standing balance Pre-activity vitals: HR: 65 SpO2: 100% Post-activity vitals: HR: 77 SpO2: 96%  CURRENT PROSTHETIC WEAR ASSESSMENT: Evaluation / 09/12/2022:  Patient is dependent with: skin check, residual limb care, care of non-amputated limb, prosthetic cleaning, ply sock cleaning, correct ply sock adjustment, proper wear schedule/adjustment, and proper weight-bearing schedule/adjustment Donning prosthesis: Min A Doffing prosthesis: SBA Prosthetic wear tolerance: 1 hours, 2x/day, 3 days for first 3 of 7 days since  delivery but stopped due to skin issue.  Prosthetic weight bearing tolerance: 5 minutes PWB LLE with prosthesis limb pain 5/10 Edema: pitting Residual limb condition: water blisters 17mm x 3 mm distal limb,  pt & wife report incision at distal tibia cracked open with prosthesis wear for first 3 days.  No hair growth, shiny skin, pale color, normal temperature & moisture.  Cylindrical shape.  Prosthetic description: silicon liner with pin lock, secondary interface pelite liner, flexible keel foot.     TODAY'S TREATMENT:                                                                                                                             DATE:  10/30/2022: Prosthetic Training with TTA prosthesis: Walking without assistance with hands hovering over bars only using them PRN X 5 round trips forward, 5 round trips lateral, Retrowalking with one UE support with one hand on bar X 5 round trips 4 square step balance X 3 CW and X 3 CCW with CGA Gait with SPC 30 feet X 4 with head turns and CGA Gait with SPC 30 feet X 4 with head nods and CGA Gait with SPC 30 feet X 4 with 4 steps eyes closed and 4 steps eyes open with SPC and CGA. Stairs 11 steps for flight: Step to pattern utilizing prosthesis with cane and single rail; with supervision. Then he performed another flight of stairs with bilateral hand rails and alternating reciprocal pattern with supervision. Patient negotiated ramp and 6.5 inch curb with cane with CGA  X1 each Leg Press DL 161# [id number], then Left leg only 56# 2X15   10/25/2022: Prosthetic Training with TTA prosthesis: No skin issues to report with wearing prosthesis all awake hours. Walking without assistance with hands hovering over bars only using them PRN X 3 round trips forward, 3 round trips lateral, 3 round trips backwards Sidestepping with bilat UE support in bars on foam beam X 4 round trips Stepping over yoga block with cane and CGA X 10 Gait with SPC 30 feet X  4 with head  turns and CGA Gait with SPC 30 feet X 4 with head nods and CGA Gait with SPC 30 feet X 4 with 3 steps eyes open and 5 steps eyes closed with SPC and CGA. Stairs 11 steps for flight: Step to pattern utilizing prosthesis with cane and single rail; with supervision Patient negotiated ramp and 6.5 inch curb with cane with CGA  X1 each Leg Press DL 161# [id number], then Left leg only 50# 2X15 Nu step Legs only L7 X 4 minutes for functional endurance/leg strength   10/23/2022: Prosthetic Training with TTA prosthesis: No skin issues. PT recommended increasing wear to all awake hours and drying limb/liner half way or if notices sweating. Don upon arising & doffe with evening bath.  If staying up >30-60 minutes after bath, then redonne with 2nd liner. Fall risk is higher when prosthesis is off now that he is subconsciously trusting it. Pt verbalized understanding. Patient ambulated 450' x 2 with cane stand-alone tip with minA/CGA.  Worked on maintaining path with scanning, ambulating 3 steps with eyes closed to simulate walking in the dark and caring 10 pound kettle bell. 4-square stepping with cane 3 reps each clockwise and counterclockwise with minA and 1 rep both directions caring 10 pound kettle bell. Patient negotiated ramp and curb with cane with minA and verbal cues on technique. Stairs 11 steps for flight: Step to pattern utilizing prosthesis with cane and single rail; alternating pattern with 2 rails.  Both with contact-guard assistance and verbal/demo and cues on technique.      PATIENT EDUCATION: PATIENT EDUCATED ON FOLLOWING PROSTHETIC CARE: Education details:  Skin check, Residual limb care, Prosthetic cleaning, Correct ply sock adjustment, Propper donning, and Proper wear schedule/adjustment sit with prosthetic heel touching ground / not dangling and Prosthetic wear tolerance: 1 hours 2x/day, daily with plan to increase weekly with no skin issues.   Person educated: Patient and Spouse Education  method: Explanation, Demonstration, Tactile cues, and Verbal cues Education comprehension: verbalized understanding, returned demonstration, verbal cues required, tactile cues required, and needs further education  HOME EXERCISE PROGRAM: 09/25/22 Do each exercise 1-2  times per day Do each exercise 5-10 repetitions Hold each exercise for 2 seconds to feel your location  AT SINK FIND YOUR MIDLINE POSITION AND PLACE FEET EQUAL DISTANCE FROM THE MIDLINE.  Try to find this position when standing still for activities.   USE TAPE ON FLOOR TO MARK THE MIDLINE POSITION which is even with middle of sink.  You also should try to feel with your limb pressure in socket.  You are trying to feel with limb what you used to feel with the bottom of your foot.  Side to Side Shift: Moving your hips only (not shoulders): move weight onto your left leg, HOLD/FEEL pressure in socket.  Move back to equal weight on each leg, HOLD/FEEL pressure in socket. Move weight onto your right leg, HOLD/FEEL pressure in socket. Move back to equal weight on each leg, HOLD/FEEL pressure in socket. Repeat.  Start with both hands on sink, progress to hand on prosthetic side only, then no hands.  Front to Back Shift: Moving your hips only (not shoulders): move your weight forward onto your toes, HOLD/FEEL pressure in socket. Move your weight back to equal Flat Foot on both legs, HOLD/FEEL  pressure in socket. Move your weight back onto your heels, HOLD/FEEL  pressure in socket. Move your weight back to equal on both legs, HOLD/FEEL  pressure in socket. Repeat.  Start with both hands on sink, progress to hand on prosthetic side only, then no hands.  Moving Cones / Cups: With equal weight on each leg: Hold on with one hand the first time, then progress to no hand supports. Move cups from one side of sink to the other. Place cups ~2" out of your reach, progress to 10" beyond reach.  Place one hand in middle of sink and reach with other hand. Do  both arms.  Then hover one hand and move cups with other hand.  Overhead/Upward Reaching: alternated reaching up to top cabinets or ceiling if no cabinets present. Keep equal weight on each leg. Start with one hand support on counter while other hand reaches and progress to no hand support with reaching.  ace one hand in middle of sink and reach with other hand. Do both arms.  Then hover one hand and move cups with other hand.  5.   Looking Over Shoulders: With equal weight on each leg: alternate turning to look over your shoulders with one hand support on counter as needed.  Start with head motions only to look in front of shoulder, then even with shoulder and progress to looking behind you. To look to side, move head /eyes, then shoulder on side looking pulls back, shift more weight to side looking and pull hip back. Place one hand in middle of sink and let go with other hand so your shoulder can pull back. Switch hands to look other way.   Then hover one hand and look over shoulder. If looking right, use left hand at sink. If looking left, use right hand at sink. 6.  Stepping with leg that is not amputated:  Move items under cabinet out of your way. Shift your hips/pelvis so weight on prosthesis. Tighten muscles in hip on prosthetic side.  SLOWLY step other leg so front of foot is in cabinet. Then step back to floor.   Walking Program 4-7 days/week Use rolling walker. Have a resting area close.  See levels below. Progress to next level when current level is not challenge or difficult.  Start walking 5 min, rest 5 min for 2 sets Add 3rd set. So you are walking total of 15 min.  Decrease rest to 4 min. Walk 5 min for 3 sets Walk 6 min, rest 4 min for 3 sets. Decrease rest to 3 min. Walking 6 min for 3 sets Walk 7 min, rest 3 min, 3 sets Walk 10 min, rest 5 min 2 sets Walk 11 min, rest 5 min, 2 sets Walk 12 min, rest 5 min, 2 sets Walk 13 min, rest 5 min, 2 sets Walk 14 min, rest 5 min, 2  sets Walk 15 min, rest 5 min, 2 sets Walk 15 min, rest 4 min, 2 sets Walk 15 min, rest 3 min, 2 sets Walk 15 min, rest 2 min, 2 sets Walk 15 min, rest 1 min, 2 sets Walk 30 min  Madani walk see above.  Short distances which is walking from one room to another. Work to increase frequency back to prior level. Try with cane.    Medium distances is distance between short & K[place]. (More than room to room but not maximum tolerated distance). You may be entering & exiting your home or community with limited distances. Goal is 4 medium walks which may going to one place in community.    ASSESSMENT:  CLINICAL IMPRESSION: He is improving with his activity tolerance for standing and walking  with prosthesis and his dynamic balance is improving overall.   OBJECTIVE IMPAIRMENTS: Abnormal gait, cardiopulmonary status limiting activity, decreased activity tolerance, decreased balance, decreased endurance, decreased knowledge of condition, decreased knowledge of use of DME, decreased mobility, difficulty walking, decreased strength, increased edema, prosthetic dependency , obesity, and pain.   ACTIVITY LIMITATIONS: carrying, lifting, bending, standing, stairs, transfers, and locomotion level  PARTICIPATION LIMITATIONS: cleaning, driving, and community activity  PERSONAL FACTORS: Fitness, Time since onset of injury/illness/exacerbation, and 3+ comorbidities: see PMH  are also affecting patient's functional outcome.   REHAB POTENTIAL: Good  CLINICAL DECISION MAKING: Evolving/moderate complexity  EVALUATION COMPLEXITY: Moderate   GOALS: Goals reviewed with patient? Yes  SHORT TERM GOALS: Target date: 10/13/2022  Patient donnes prosthesis modified independent & verbalizes proper cleaning. Baseline: SEE OBJECTIVE DATA Goal status: MET 10/16/2022 2.  Patient tolerates prosthesis >6 hrs total /day without skin issues or limb pain <3/10 after standing. Baseline: SEE OBJECTIVE DATA Goal status:  MET  10/16/2022  3.  Patient able to reach 7" and look over both shoulders without UE support with supervision. Baseline: SEE OBJECTIVE DATA Goal status:  MET 10/16/2022  4. Patient ambulates 32' with RW & prosthesis with supervision. Baseline: SEE OBJECTIVE DATA Goal status:  MET 10/16/2022  5. Patient negotiates ramps & curbs with RW & prosthesis with minA. Baseline: SEE OBJECTIVE DATA Goal status:  MET 10/16/2022  [place] TERM GOALS: Target date: 11/10/2022  Patient demonstrates & verbalized understanding of prosthetic care to enable safe utilization of prosthesis. Baseline: SEE OBJECTIVE DATA Goal status: ongoing 09/20/2022  Patient tolerates prosthesis wear >90% of awake hours without skin or limb pain issues. Baseline: SEE OBJECTIVE DATA Goal status: ongoing 09/20/2022  Berg Balance score >/= 36/56 to indicate lower fall risk Baseline: SEE OBJECTIVE DATA Goal status: ongoing 09/20/2022  Patient ambulates >300' with RW / rollator walker & prosthesis modified independently Baseline: SEE OBJECTIVE DATA Goal status: ongoing 09/20/2022  Patient negotiates ramps, curbs with RW / rollator walker & prosthesis modified independently. Baseline: SEE OBJECTIVE DATA Goal status: ongoing 09/20/2022   PLAN:  PT FREQUENCY: 2x/week  PT DURATION: 8 weeks  PLANNED INTERVENTIONS: Therapeutic exercises, Therapeutic activity, Neuromuscular re-education, Balance training, Gait training, Patient/Family education, Self Care, Stair training, Vestibular training, Prosthetic training, DME instructions, and physical performance testing  PLAN FOR NEXT SESSION: Continue with community based gait activities with SPC including ramp/curb, continue to work on stairs and balance   April Manson, PT, [name] 10/30/2022, 2:25 PM

## 2022-11-01 ENCOUNTER — Ambulatory Visit (INDEPENDENT_AMBULATORY_CARE_PROVIDER_SITE_OTHER): Payer: No Typology Code available for payment source | Admitting: Physical Therapy

## 2022-11-01 ENCOUNTER — Encounter: Payer: Self-pay | Admitting: Physical Therapy

## 2022-11-01 DIAGNOSIS — L98498 Non-pressure chronic ulcer of skin of other sites with other specified severity: Secondary | ICD-10-CM | POA: Diagnosis not present

## 2022-11-01 DIAGNOSIS — M6281 Muscle weakness (generalized): Secondary | ICD-10-CM

## 2022-11-01 DIAGNOSIS — R2681 Unsteadiness on feet: Secondary | ICD-10-CM | POA: Diagnosis not present

## 2022-11-01 DIAGNOSIS — R2689 Other abnormalities of gait and mobility: Secondary | ICD-10-CM

## 2022-11-01 DIAGNOSIS — Z7409 Other reduced mobility: Secondary | ICD-10-CM

## 2022-11-01 DIAGNOSIS — M79662 Pain in left lower leg: Secondary | ICD-10-CM | POA: Diagnosis not present

## 2022-11-01 NOTE — Therapy (Signed)
OUTPATIENT PHYSICAL THERAPY PROSTHETIC TREATMENT   Patient Name: [name] MRN: [id number] DOB:04-Oct-1955, 67 y.o., male Today's Date: 11/01/2022  PCP: Nelwyn Salisbury, MD REFERRING PROVIDER: Sheppard Plumber, NP  END OF SESSION:  PT End of Session - 11/01/22 1646     Visit Number 15    Number of Visits 17    Date for PT Re-Evaluation 11/10/22    Authorization Type [employer]    Authorization Time Period 20% co-insurance, 89 PT visits remaining    Authorization - Number of Visits 89    Progress Note Due on Visit 20    PT Start Time 1600    PT Stop Time 1645    PT Time Calculation (min) 45 min    Equipment Utilized During Treatment Gait belt    Activity Tolerance Patient tolerated treatment well    Behavior During Therapy WFL for tasks assessed/performed                    Past Medical History:  Diagnosis Date   CAD (coronary artery disease)    a. 05/2022 Cath: LM nl, LAD 10m/78m, 70d, D3 90, LCX 74m, 60d, OM1 sev diff dzs, OM2 85p, OM2 subbranch sev diff dzs, RCA 70p/m, 28m.   CHF (congestive heart failure) (HCC)    Chronic HFrEF (heart failure with reduced ejection fraction) (HCC)    a. 05/2022 Echo: EF 25-30%, mild LVH, GrII DD, mid-apical, anteroseptal, and apical HK. Nl RV fxn. RVSP 51.10mmHg. Mildly dil LA. Mod MR/TR. Mild AS/AI.   Diabetes mellitus    Hematospermia    Hypertension    Ischemic cardiomyopathy    a. 05/2022 Echo: EF 25-30%.   Moderate Mitral regurgitation    Moderate tricuspid regurgitation    Nephrolithiasis 02/07/2007   PAD (peripheral artery disease) (HCC)    a. 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   Viral meningitis    Past Surgical History:  Procedure Laterality Date   AMPUTATION Left 05/19/2022   Procedure: AMPUTATION BELOW KNEE;  Surgeon: Renford Dills, MD;  Location: [facility];  Service: Vascular;  Laterality: Left;   CHOLECYSTECTOMY  12-07-14   laparoscopic, per Dr. Rayburn Ma    LOWER EXTREMITY ANGIOGRAPHY Left 05/16/2022   Procedure: Lower  Extremity Angiography;  Surgeon: Renford Dills, MD;  Location: [facility];  Service: Cardiovascular;  Laterality: Left;   RIGHT/LEFT HEART CATH AND CORONARY ANGIOGRAPHY N/A 06/02/2022   Procedure: RIGHT/LEFT HEART CATH AND CORONARY ANGIOGRAPHY;  Surgeon: Yvonne Kendall, MD;  Location: [facility];  Service: Cardiovascular;  Laterality: N/A;   Patient Active Problem List   Diagnosis Date Noted   Atherosclerosis of native arteries of extremity with intermittent claudication (HCC) 10/06/2022   Pneumonia of both upper lobes due to infectious organism 06/14/2022   Dilated cardiomyopathy (HCC) 06/04/2022   Coronary artery disease of native artery of native heart with stable angina pectoris (HCC) 06/03/2022   Ischemic cardiomyopathy 06/03/2022   PAD (peripheral artery disease) (HCC) 06/02/2022   Diabetes mellitus type 2 with complications (HCC) 06/02/2022   Elevated troponin 06/02/2022   HFrEF (heart failure with reduced ejection fraction) (HCC) 05/31/2022   Demand ischemia 05/27/2022   Controlled type 2 diabetes mellitus with hyperglycemia, with Debarr-term current use of insulin (HCC) 05/27/2022   Anemia 05/27/2022   Acute on chronic HFrEF (heart failure with reduced ejection fraction) (HCC) 05/27/2022   Hx of BKA, left (HCC) 05/27/2022   Open displaced avulsion fracture of tuberosity of left calcaneus 05/23/2022  AMS (altered mental status) 05/21/2022   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 due to underlying condition (HCC) 05/17/2022   Hypertensive urgency 05/16/2022   Gangrene of left foot (HCC) 05/16/2022   Pyogenic inflammation of bone (HCC) 05/16/2022   Diabetic retinopathy associated with type 2 diabetes mellitus (HCC) 01/02/2022   Hypothyroidism 08/23/2021   Type 2 diabetes mellitus with diabetic neuropathy, unspecified (HCC) 04/16/2020   Cataracts, bilateral 04/16/2020   Sinus tachycardia 01/14/2020   Insomnia 01/07/2020    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 2 diabetes mellitus (HCC) 07/27/2017   Generalized anxiety disorder 07/27/2017   Chronic right flank pain 12/20/2015   Hyperkalemia 10/21/2014   Chest pain 09/21/2014   ADHD 06/22/2010   ERECTILE DYSFUNCTION, ORGANIC 06/22/2010   MORBID OBESITY 08/04/2008   NEPHROLITHIASIS, HX OF 03/22/2007   Essential hypertension 02/22/2007    ONSET DATE: prosthesis delivery 08/29/2022  REFERRING DIAG: Z89.519 (ICD-10-CM) - Hx of BKA   THERAPY DIAG:  Other abnormalities of gait and mobility  Unsteadiness on feet  Non-pressure chronic ulcer of skin of other sites with other specified severity (HCC)  Pain in left lower leg  Impaired functional mobility and activity tolerance  Muscle weakness (generalized)  Rationale for Evaluation and Treatment: Rehabilitation  SUBJECTIVE:   SUBJECTIVE STATEMENT: He denies pain but wants to know what to do if he happens to fall  PERTINENT HISTORY: HTN, IDDM2, diabetic neuropathy, PVD, hypothyroidism, viral meningitis  PAIN:  Are you having pain? No   PRECAUTIONS: Fall  WEIGHT BEARING RESTRICTIONS: No  FALLS: Has patient fallen in last 6 months? No  LIVING ENVIRONMENT: Lives with: lives with their spouse Lives in: House  Home Access: Ramped entrance primary or option of stairs Home layout: One level Stairs: Yes: External: 4-5 steps; bilateral but cannot reach both Has following equipment at home: Single point cane, Walker - 4 wheeled, Wheelchair (manual), Shower bench, bed side commode, Grab bars, and Ramped entry  PLOF: Independent, Independent with household mobility with device, and Independent with community mobility with device    PATIENT GOALS:  to use prosthesis to be mobile to enable most appropriate cardiac surgery.   OBJECTIVE:  COGNITION: Overall cognitive status: Within functional limits for tasks assessed  POSTURE: rounded shoulders, forward head, flexed trunk , and weight shift right  LOWER EXTREMITY  ROM:  ROM P:passive  A:active Right eval Left eval  Hip flexion    Hip extension    Hip abduction    Hip adduction    Hip internal rotation    Hip external rotation    Knee flexion    Knee extension    Ankle dorsiflexion    Ankle plantarflexion    Ankle inversion    Ankle eversion     (Blank rows = not tested)  LOWER EXTREMITY MMT:  MMT Right eval Left eval  Hip flexion    Hip extension    Hip abduction    Hip adduction    Hip internal rotation    Hip external rotation    Knee flexion    Knee extension    Ankle dorsiflexion    Ankle plantarflexion    Ankle inversion    Ankle eversion    (Blank rows = not tested)  TRANSFERS: Sit to stand: SBA 22" w/c using armrests using back of legs against w/c to stabilize and touch on wall to left.   Stand to sit: SBA RW to 22" w/c using armrests   GAIT: Distance walked: 25' Assistive device  utilized: Environmental consultant - 2 wheeled and left TTA prosthesis Level of assistance: Min A Gait pattern: step to pattern, decreased step length- Right, decreased stance time- Left, decreased hip/knee flexion- Left, Left hip hike, knee flexed in stance- Left, antalgic, lateral hip instability, trunk flexed, and abducted- Left Comments: excessive BUE weight bearing.   FUNCTIONAL TESTs:  Sharlene Motts Balance Scale: 14/56   CARDIOVASCULAR RESPONSE: Functional activity: gait & standing balance Pre-activity vitals: HR: 65 SpO2: 100% Post-activity vitals: HR: 77 SpO2: 96%  CURRENT PROSTHETIC WEAR ASSESSMENT: Evaluation / 09/12/2022:  Patient is dependent with: skin check, residual limb care, care of non-amputated limb, prosthetic cleaning, ply sock cleaning, correct ply sock adjustment, proper wear schedule/adjustment, and proper weight-bearing schedule/adjustment Donning prosthesis: Min A Doffing prosthesis: SBA Prosthetic wear tolerance: 1 hours, 2x/day, 3 days for first 3 of 7 days since delivery but stopped due to skin issue.  Prosthetic weight  bearing tolerance: 5 minutes PWB LLE with prosthesis limb pain 5/10 Edema: pitting Residual limb condition: water blisters 17mm x 3 mm distal limb,  pt & wife report incision at distal tibia cracked open with prosthesis wear for first 3 days.  No hair growth, shiny skin, pale color, normal temperature & moisture.  Cylindrical shape.  Prosthetic description: silicon liner with pin lock, secondary interface pelite liner, flexible keel foot.     TODAY'S TREATMENT:                                                                                                                             DATE:  10/30/2022: Prosthetic Training with TTA prosthesis: Reviewed how to get up from the floor if falling and he can do this if he has chair available and he is able to get up using either leg to power up with. If he does not have chair or something to get UE support from then he is unable to bring his leg underneath him enough to attempt to stand Worked on ascending/descending ladder, he is only able to go up one rung due to weakness Pulling 25# cable X 5 reps with CGA Pulling 20# on cables (10# each side) X 5 reps with CGA Picked up timer off floor using SPC for UE support Suitcase carry 10# while using SPC in other hand 75 feet each side   10/30/2022: Prosthetic Training with TTA prosthesis: Walking without assistance with hands hovering over bars only using them PRN X 5 round trips forward, 5 round trips lateral, Retrowalking with one UE support with one hand on bar X 5 round trips 4 square step balance X 3 CW and X 3 CCW with CGA Gait with SPC 30 feet X 4 with head turns and CGA Gait with SPC 30 feet X 4 with head nods and CGA Gait with SPC 30 feet X 4 with 4 steps eyes closed and 4 steps eyes open with SPC and CGA. Stairs 11 steps for flight: Step to pattern utilizing prosthesis with cane and  single rail; with supervision. Then he performed another flight of stairs with bilateral hand rails and  alternating reciprocal pattern with supervision. Patient negotiated ramp and 6.5 inch curb with cane with CGA  X1 each Leg Press DL 562# 1H08, then Left leg only 56# 2X15   10/25/2022: Prosthetic Training with TTA prosthesis: No skin issues to report with wearing prosthesis all awake hours. Walking without assistance with hands hovering over bars only using them PRN X 3 round trips forward, 3 round trips lateral, 3 round trips backwards Sidestepping with bilat UE support in bars on foam beam X 4 round trips Stepping over yoga block with cane and CGA X 10 Gait with SPC 30 feet X 4 with head turns and CGA Gait with SPC 30 feet X 4 with head nods and CGA Gait with SPC 30 feet X 4 with 3 steps eyes open and 5 steps eyes closed with SPC and CGA. Stairs 11 steps for flight: Step to pattern utilizing prosthesis with cane and single rail; with supervision Patient negotiated ramp and 6.5 inch curb with cane with CGA  X1 each Leg Press DL 657# [id number], then Left leg only 50# 2X15 Nu step Legs only L7 X 4 minutes for functional endurance/leg strength   10/23/2022: Prosthetic Training with TTA prosthesis: No skin issues. PT recommended increasing wear to all awake hours and drying limb/liner half way or if notices sweating. Don upon arising & doffe with evening bath.  If staying up >30-60 minutes after bath, then redonne with 2nd liner. Fall risk is higher when prosthesis is off now that he is subconsciously trusting it. Pt verbalized understanding. Patient ambulated 450' x 2 with cane stand-alone tip with minA/CGA.  Worked on maintaining path with scanning, ambulating 3 steps with eyes closed to simulate walking in the dark and caring 10 pound kettle bell. 4-square stepping with cane 3 reps each clockwise and counterclockwise with minA and 1 rep both directions caring 10 pound kettle bell. Patient negotiated ramp and curb with cane with minA and verbal cues on technique. Stairs 11 steps for flight: Step to  pattern utilizing prosthesis with cane and single rail; alternating pattern with 2 rails.  Both with contact-guard assistance and verbal/demo and cues on technique.      PATIENT EDUCATION: PATIENT EDUCATED ON FOLLOWING PROSTHETIC CARE: Education details:  Skin check, Residual limb care, Prosthetic cleaning, Correct ply sock adjustment, Propper donning, and Proper wear schedule/adjustment sit with prosthetic heel touching ground / not dangling and Prosthetic wear tolerance: 1 hours 2x/day, daily with plan to increase weekly with no skin issues.   Person educated: Patient and Spouse Education method: Explanation, Demonstration, Tactile cues, and Verbal cues Education comprehension: verbalized understanding, returned demonstration, verbal cues required, tactile cues required, and needs further education  HOME EXERCISE PROGRAM: 09/25/22 Do each exercise 1-2  times per day Do each exercise 5-10 repetitions Hold each exercise for 2 seconds to feel your location  AT SINK FIND YOUR MIDLINE POSITION AND PLACE FEET EQUAL DISTANCE FROM THE MIDLINE.  Try to find this position when standing still for activities.   USE TAPE ON FLOOR TO MARK THE MIDLINE POSITION which is even with middle of sink.  You also should try to feel with your limb pressure in socket.  You are trying to feel with limb what you used to feel with the bottom of your foot.  Side to Side Shift: Moving your hips only (not shoulders): move weight onto your left leg, HOLD/FEEL pressure in  socket.  Move back to equal weight on each leg, HOLD/FEEL pressure in socket. Move weight onto your right leg, HOLD/FEEL pressure in socket. Move back to equal weight on each leg, HOLD/FEEL pressure in socket. Repeat.  Start with both hands on sink, progress to hand on prosthetic side only, then no hands.  Front to Back Shift: Moving your hips only (not shoulders): move your weight forward onto your toes, HOLD/FEEL pressure in socket. Move your weight back  to equal Flat Foot on both legs, HOLD/FEEL  pressure in socket. Move your weight back onto your heels, HOLD/FEEL  pressure in socket. Move your weight back to equal on both legs, HOLD/FEEL  pressure in socket. Repeat.  Start with both hands on sink, progress to hand on prosthetic side only, then no hands.  Moving Cones / Cups: With equal weight on each leg: Hold on with one hand the first time, then progress to no hand supports. Move cups from one side of sink to the other. Place cups ~2" out of your reach, progress to 10" beyond reach.  Place one hand in middle of sink and reach with other hand. Do both arms.  Then hover one hand and move cups with other hand.  Overhead/Upward Reaching: alternated reaching up to top cabinets or ceiling if no cabinets present. Keep equal weight on each leg. Start with one hand support on counter while other hand reaches and progress to no hand support with reaching.  ace one hand in middle of sink and reach with other hand. Do both arms.  Then hover one hand and move cups with other hand.  5.   Looking Over Shoulders: With equal weight on each leg: alternate turning to look over your shoulders with one hand support on counter as needed.  Start with head motions only to look in front of shoulder, then even with shoulder and progress to looking behind you. To look to side, move head /eyes, then shoulder on side looking pulls back, shift more weight to side looking and pull hip back. Place one hand in middle of sink and let go with other hand so your shoulder can pull back. Switch hands to look other way.   Then hover one hand and look over shoulder. If looking right, use left hand at sink. If looking left, use right hand at sink. 6.  Stepping with leg that is not amputated:  Move items under cabinet out of your way. Shift your hips/pelvis so weight on prosthesis. Tighten muscles in hip on prosthetic side.  SLOWLY step other leg so front of foot is in cabinet. Then step back to  floor.   Walking Program 4-7 days/week Use rolling walker. Have a resting area close.  See levels below. Progress to next level when current level is not challenge or difficult.  Start walking 5 min, rest 5 min for 2 sets Add 3rd set. So you are walking total of 15 min.  Decrease rest to 4 min. Walk 5 min for 3 sets Walk 6 min, rest 4 min for 3 sets. Decrease rest to 3 min. Walking 6 min for 3 sets Walk 7 min, rest 3 min, 3 sets Walk 10 min, rest 5 min 2 sets Walk 11 min, rest 5 min, 2 sets Walk 12 min, rest 5 min, 2 sets Walk 13 min, rest 5 min, 2 sets Walk 14 min, rest 5 min, 2 sets Walk 15 min, rest 5 min, 2 sets Walk 15 min, rest 4 min, 2  sets Walk 15 min, rest 3 min, 2 sets Walk 15 min, rest 2 min, 2 sets Walk 15 min, rest 1 min, 2 sets Walk 30 min  [place] walk see above.  Short distances which is walking from one room to another. Work to increase frequency back to prior level. Try with cane.    Medium distances is distance between short & [place]. (More than room to room but not maximum tolerated distance). You may be entering & exiting your home or community with limited distances. Goal is 4 medium walks which may going to one place in community.    ASSESSMENT:  CLINICAL IMPRESSION: He has made great progress with PT and arrives with SPC only vs RW. We reviewed some possible scenarios today he may encounter and will work toward making these less difficult on him.    OBJECTIVE IMPAIRMENTS: Abnormal gait, cardiopulmonary status limiting activity, decreased activity tolerance, decreased balance, decreased endurance, decreased knowledge of condition, decreased knowledge of use of DME, decreased mobility, difficulty walking, decreased strength, increased edema, prosthetic dependency , obesity, and pain.   ACTIVITY LIMITATIONS: carrying, lifting, bending, standing, stairs, transfers, and locomotion level  PARTICIPATION LIMITATIONS: cleaning, driving, and community  activity  PERSONAL FACTORS: Fitness, Time since onset of injury/illness/exacerbation, and 3+ comorbidities: see PMH  are also affecting patient's functional outcome.   REHAB POTENTIAL: Good  CLINICAL DECISION MAKING: Evolving/moderate complexity  EVALUATION COMPLEXITY: Moderate   GOALS: Goals reviewed with patient? Yes  SHORT TERM GOALS: Target date: 10/13/2022  Patient donnes prosthesis modified independent & verbalizes proper cleaning. Baseline: SEE OBJECTIVE DATA Goal status: MET 10/16/2022 2.  Patient tolerates prosthesis >6 hrs total /day without skin issues or limb pain <3/10 after standing. Baseline: SEE OBJECTIVE DATA Goal status:  MET 10/16/2022  3.  Patient able to reach 7" and look over both shoulders without UE support with supervision. Baseline: SEE OBJECTIVE DATA Goal status:  MET 10/16/2022  4. Patient ambulates 69' with RW & prosthesis with supervision. Baseline: SEE OBJECTIVE DATA Goal status:  MET 10/16/2022  5. Patient negotiates ramps & curbs with RW & prosthesis with minA. Baseline: SEE OBJECTIVE DATA Goal status:  MET 10/16/2022  [place] TERM GOALS: Target date: 11/10/2022  Patient demonstrates & verbalized understanding of prosthetic care to enable safe utilization of prosthesis. Baseline: SEE OBJECTIVE DATA Goal status: ongoing 09/20/2022  Patient tolerates prosthesis wear >90% of awake hours without skin or limb pain issues. Baseline: SEE OBJECTIVE DATA Goal status: ongoing 09/20/2022  Berg Balance score >/= 36/56 to indicate lower fall risk Baseline: SEE OBJECTIVE DATA Goal status: ongoing 09/20/2022  Patient ambulates >300' with RW / rollator walker & prosthesis modified independently Baseline: SEE OBJECTIVE DATA Goal status: ongoing 09/20/2022  Patient negotiates ramps, curbs with RW / rollator walker & prosthesis modified independently. Baseline: SEE OBJECTIVE DATA Goal status: ongoing 09/20/2022   PLAN:  PT FREQUENCY: 2x/week  PT DURATION: 8  weeks  PLANNED INTERVENTIONS: Therapeutic exercises, Therapeutic activity, Neuromuscular re-education, Balance training, Gait training, Patient/Family education, Self Care, Stair training, Vestibular training, Prosthetic training, DME instructions, and physical performance testing  PLAN FOR NEXT SESSION:functional strength and balance   April Manson, PT, [name] 11/01/2022, 4:46 PM

## 2022-11-06 ENCOUNTER — Encounter: Payer: Self-pay | Admitting: Physical Therapy

## 2022-11-06 ENCOUNTER — Ambulatory Visit (INDEPENDENT_AMBULATORY_CARE_PROVIDER_SITE_OTHER): Payer: No Typology Code available for payment source | Admitting: Physical Therapy

## 2022-11-06 ENCOUNTER — Ambulatory Visit: Payer: No Typology Code available for payment source | Admitting: Neurology

## 2022-11-06 DIAGNOSIS — Z7409 Other reduced mobility: Secondary | ICD-10-CM | POA: Diagnosis not present

## 2022-11-06 DIAGNOSIS — M6281 Muscle weakness (generalized): Secondary | ICD-10-CM

## 2022-11-06 DIAGNOSIS — R2689 Other abnormalities of gait and mobility: Secondary | ICD-10-CM | POA: Diagnosis not present

## 2022-11-06 DIAGNOSIS — L98498 Non-pressure chronic ulcer of skin of other sites with other specified severity: Secondary | ICD-10-CM | POA: Diagnosis not present

## 2022-11-06 DIAGNOSIS — R2681 Unsteadiness on feet: Secondary | ICD-10-CM

## 2022-11-06 NOTE — Therapy (Signed)
OUTPATIENT PHYSICAL THERAPY PROSTHETIC TREATMENT/Discharge PHYSICAL THERAPY DISCHARGE SUMMARY  Visits from Start of Care: 16  Current functional level related to goals / functional outcomes: See below   Remaining deficits: See below   Education / Equipment: HEP  Plan:  Patient goals were  met. Patient is being discharged due to meeting the PT goals      Patient Name: [name] MRN: [id number] DOB:03-11-1956, 67 y.o., male Today's Date: 11/06/2022  PCP: Nelwyn Salisbury, MD REFERRING PROVIDER: Sheppard Plumber, NP  END OF SESSION:  PT End of Session - 11/06/22 1550     Visit Number 16    Number of Visits 17    Date for PT Re-Evaluation 11/10/22    Authorization Type [employer]    Authorization Time Period 20% co-insurance, 89 PT visits remaining    Authorization - Number of Visits 89    Progress Note Due on Visit 20    PT Start Time 1555    PT Stop Time 1640    PT Time Calculation (min) 45 min    Activity Tolerance Patient tolerated treatment well    Behavior During Therapy WFL for tasks assessed/performed                    Past Medical History:  Diagnosis Date   CAD (coronary artery disease)    a. 05/2022 Cath: LM nl, LAD 71m/46m, 70d, D3 90, LCX 64m, 60d, OM1 sev diff dzs, OM2 85p, OM2 subbranch sev diff dzs, RCA 70p/m, 67m.   CHF (congestive heart failure) (HCC)    Chronic HFrEF (heart failure with reduced ejection fraction) (HCC)    a. 05/2022 Echo: EF 25-30%, mild LVH, GrII DD, mid-apical, anteroseptal, and apical HK. Nl RV fxn. RVSP 51.34mmHg. Mildly dil LA. Mod MR/TR. Mild AS/AI.   Diabetes mellitus    Hematospermia    Hypertension    Ischemic cardiomyopathy    a. 05/2022 Echo: EF 25-30%.   Moderate Mitral regurgitation    Moderate tricuspid regurgitation    Nephrolithiasis 02/07/2007   PAD (peripheral artery disease) (HCC)    a. 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   Viral meningitis    Past Surgical History:  Procedure Laterality Date   AMPUTATION Left  05/19/2022   Procedure: AMPUTATION BELOW KNEE;  Surgeon: Renford Dills, MD;  Location: [facility];  Service: Vascular;  Laterality: Left;   CHOLECYSTECTOMY  12-07-14   laparoscopic, per Dr. Rayburn Ma    LOWER EXTREMITY ANGIOGRAPHY Left 05/16/2022   Procedure: Lower Extremity Angiography;  Surgeon: Renford Dills, MD;  Location: [facility];  Service: Cardiovascular;  Laterality: Left;   RIGHT/LEFT HEART CATH AND CORONARY ANGIOGRAPHY N/A 06/02/2022   Procedure: RIGHT/LEFT HEART CATH AND CORONARY ANGIOGRAPHY;  Surgeon: Yvonne Kendall, MD;  Location: [facility];  Service: Cardiovascular;  Laterality: N/A;   Patient Active Problem List   Diagnosis Date Noted   Atherosclerosis of native arteries of extremity with intermittent claudication (HCC) 10/06/2022   Pneumonia of both upper lobes due to infectious organism 06/14/2022   Dilated cardiomyopathy (HCC) 06/04/2022   Coronary artery disease of native artery of native heart with stable angina pectoris (HCC) 06/03/2022   Ischemic cardiomyopathy 06/03/2022   PAD (peripheral artery disease) (HCC) 06/02/2022   Diabetes mellitus type 2 with complications (HCC) 06/02/2022   Elevated troponin 06/02/2022   HFrEF (heart failure with reduced ejection fraction) (HCC) 05/31/2022   Demand ischemia 05/27/2022   Controlled type 2 diabetes mellitus with hyperglycemia, with  Morrow-term current use of insulin (HCC) 05/27/2022   Anemia 05/27/2022   Acute on chronic HFrEF (heart failure with reduced ejection fraction) (HCC) 05/27/2022   Hx of BKA, left (HCC) 05/27/2022   Open displaced avulsion fracture of tuberosity of left calcaneus 05/23/2022   AMS (altered mental status) 05/21/2022   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 due to underlying condition (HCC) 05/17/2022   Hypertensive urgency 05/16/2022   Gangrene of left foot (HCC) 05/16/2022   Pyogenic inflammation of bone (HCC) 05/16/2022   Diabetic retinopathy  associated with type 2 diabetes mellitus (HCC) 01/02/2022   Hypothyroidism 08/23/2021   Type 2 diabetes mellitus with diabetic neuropathy, unspecified (HCC) 04/16/2020   Cataracts, bilateral 04/16/2020   Sinus tachycardia 01/14/2020   Insomnia 01/07/2020   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 2 diabetes mellitus (HCC) 07/27/2017   Generalized anxiety disorder 07/27/2017   Chronic right flank pain 12/20/2015   Hyperkalemia 10/21/2014   Chest pain 09/21/2014   ADHD 06/22/2010   ERECTILE DYSFUNCTION, ORGANIC 06/22/2010   MORBID OBESITY 08/04/2008   NEPHROLITHIASIS, HX OF 03/22/2007   Essential hypertension 02/22/2007    ONSET DATE: prosthesis delivery 08/29/2022  REFERRING DIAG: Z89.519 (ICD-10-CM) - Hx of BKA   THERAPY DIAG:  Other abnormalities of gait and mobility  Unsteadiness on feet  Non-pressure chronic ulcer of skin of other sites with other specified severity (HCC)  Impaired functional mobility and activity tolerance  Muscle weakness (generalized)  Rationale for Evaluation and Treatment: Rehabilitation  SUBJECTIVE:   SUBJECTIVE STATEMENT: He denies pain he feels ready to DC today.  PERTINENT HISTORY: HTN, IDDM2, diabetic neuropathy, PVD, hypothyroidism, viral meningitis  PAIN:  Are you having pain? No   PRECAUTIONS: Fall  WEIGHT BEARING RESTRICTIONS: No  FALLS: Has patient fallen in last 6 months? No  LIVING ENVIRONMENT: Lives with: lives with their spouse Lives in: House  Home Access: Ramped entrance primary or option of stairs Home layout: One level Stairs: Yes: External: 4-5 steps; bilateral but cannot reach both Has following equipment at home: Single point cane, Walker - 4 wheeled, Wheelchair (manual), Shower bench, bed side commode, Grab bars, and Ramped entry  PLOF: Independent, Independent with household mobility with device, and Independent with community mobility with device    PATIENT GOALS:  to use prosthesis to be mobile to enable most appropriate  cardiac surgery.   OBJECTIVE:  COGNITION: Overall cognitive status: Within functional limits for tasks assessed  POSTURE: rounded shoulders, forward head, flexed trunk , and weight shift right  LOWER EXTREMITY ROM:  ROM P:passive  A:active Right eval Left eval  Hip flexion    Hip extension    Hip abduction    Hip adduction    Hip internal rotation    Hip external rotation    Knee flexion    Knee extension    Ankle dorsiflexion    Ankle plantarflexion    Ankle inversion    Ankle eversion     (Blank rows = not tested)  LOWER EXTREMITY MMT:  MMT Right eval Left eval  Hip flexion    Hip extension    Hip abduction    Hip adduction    Hip internal rotation    Hip external rotation    Knee flexion    Knee extension    Ankle dorsiflexion    Ankle plantarflexion    Ankle inversion    Ankle eversion    (Blank rows = not tested)  TRANSFERS: Sit to stand: SBA 22" w/c using  armrests using back of legs against w/c to stabilize and touch on wall to left.   Stand to sit: SBA RW to 22" w/c using armrests   GAIT: Distance walked: 25' Assistive device utilized: Environmental consultant - 2 wheeled and left TTA prosthesis Level of assistance: Min A Gait pattern: step to pattern, decreased step length- Right, decreased stance time- Left, decreased hip/knee flexion- Left, Left hip hike, knee flexed in stance- Left, antalgic, lateral hip instability, trunk flexed, and abducted- Left Comments: excessive BUE weight bearing.   FUNCTIONAL TESTs:  Sharlene Motts Balance Scale: 14/56  [facility] PT Assessment - 11/06/22 0001       Berg Balance Test   Sit to Stand Able to stand without using hands and stabilize independently    Standing Unsupported Able to stand safely 2 minutes    Sitting with Back Unsupported but Feet Supported on Floor or Stool Able to sit safely and securely 2 minutes    Stand to Sit Sits safely with minimal use of hands    Transfers Able to transfer safely, minor use of hands    Standing  Unsupported with Eyes Closed Able to stand 3 seconds    Standing Unsupported with Feet Together Able to place feet together independently and stand for 1 minute with supervision    From Standing, Reach Forward with Outstretched Arm Can reach forward >12 cm safely (5")    From Standing Position, Pick up Object from Floor Able to pick up shoe, needs supervision    From Standing Position, Turn to Look Behind Over each Shoulder Looks behind one side only/other side shows less weight shift    Turn 360 Degrees Able to turn 360 degrees safely but slowly    Standing Unsupported, Alternately Place Feet on Step/Stool Able to stand independently and complete 8 steps >20 seconds    Standing Unsupported, One Foot in Front Able to take small step independently and hold 30 seconds    Standing on One Leg Unable to try or needs assist to prevent fall    Total Score 41               CARDIOVASCULAR RESPONSE: Functional activity: gait & standing balance Pre-activity vitals: HR: 65 SpO2: 100% Post-activity vitals: HR: 77 SpO2: 96%  CURRENT PROSTHETIC WEAR ASSESSMENT: Evaluation / 09/12/2022:  Patient is dependent with: skin check, residual limb care, care of non-amputated limb, prosthetic cleaning, ply sock cleaning, correct ply sock adjustment, proper wear schedule/adjustment, and proper weight-bearing schedule/adjustment Donning prosthesis: Min A Doffing prosthesis: SBA Prosthetic wear tolerance: 1 hours, 2x/day, 3 days for first 3 of 7 days since delivery but stopped due to skin issue.  Prosthetic weight bearing tolerance: 5 minutes PWB LLE with prosthesis limb pain 5/10 Edema: pitting Residual limb condition: water blisters 17mm x 3 mm distal limb,  pt & wife report incision at distal tibia cracked open with prosthesis wear for first 3 days.  No hair growth, shiny skin, pale color, normal temperature & moisture.  Cylindrical shape.  Prosthetic description: silicon liner with pin lock, secondary  interface pelite liner, flexible keel foot.     TODAY'S TREATMENT:  DATE:  11/06/2022: Prosthetic Training with TTA prosthesis: Reviewed how to get up from the floor if falling and showed him exercise to add into HEP using 2 chairs performing lunges and had him do 10 reps bilat with this Worked on ascending/descending stairs and he can do this reciprocal if has 2 handrails and one step at a time if one hand rail and SPC. BERG balance test, see above for details Updated goals see below Recommendation to make an appointment with prosthetist to get pads added as he is having to wear 10 ply socks.      PATIENT EDUCATION: PATIENT EDUCATED ON FOLLOWING PROSTHETIC CARE: Education details:  Skin check, Residual limb care, Prosthetic cleaning, Correct ply sock adjustment, Propper donning, and Proper wear schedule/adjustment sit with prosthetic heel touching ground / not dangling and Prosthetic wear tolerance: 1 hours 2x/day, daily with plan to increase weekly with no skin issues.   Person educated: Patient and Spouse Education method: Explanation, Demonstration, Tactile cues, and Verbal cues Education comprehension: verbalized understanding, returned demonstration, verbal cues required, tactile cues required, and needs further education  HOME EXERCISE PROGRAM: 09/25/22 Do each exercise 1-2  times per day Do each exercise 5-10 repetitions Hold each exercise for 2 seconds to feel your location  AT SINK FIND YOUR MIDLINE POSITION AND PLACE FEET EQUAL DISTANCE FROM THE MIDLINE.  Try to find this position when standing still for activities.   USE TAPE ON FLOOR TO MARK THE MIDLINE POSITION which is even with middle of sink.  You also should try to feel with your limb pressure in socket.  You are trying to feel with limb what you used to feel with the bottom of your foot.  Side to  Side Shift: Moving your hips only (not shoulders): move weight onto your left leg, HOLD/FEEL pressure in socket.  Move back to equal weight on each leg, HOLD/FEEL pressure in socket. Move weight onto your right leg, HOLD/FEEL pressure in socket. Move back to equal weight on each leg, HOLD/FEEL pressure in socket. Repeat.  Start with both hands on sink, progress to hand on prosthetic side only, then no hands.  Front to Back Shift: Moving your hips only (not shoulders): move your weight forward onto your toes, HOLD/FEEL pressure in socket. Move your weight back to equal Flat Foot on both legs, HOLD/FEEL  pressure in socket. Move your weight back onto your heels, HOLD/FEEL  pressure in socket. Move your weight back to equal on both legs, HOLD/FEEL  pressure in socket. Repeat.  Start with both hands on sink, progress to hand on prosthetic side only, then no hands.  Moving Cones / Cups: With equal weight on each leg: Hold on with one hand the first time, then progress to no hand supports. Move cups from one side of sink to the other. Place cups ~2" out of your reach, progress to 10" beyond reach.  Place one hand in middle of sink and reach with other hand. Do both arms.  Then hover one hand and move cups with other hand.  Overhead/Upward Reaching: alternated reaching up to top cabinets or ceiling if no cabinets present. Keep equal weight on each leg. Start with one hand support on counter while other hand reaches and progress to no hand support with reaching.  ace one hand in middle of sink and reach with other hand. Do both arms.  Then hover one hand and move cups with other hand.  5.   Looking Over Shoulders: With equal weight  on each leg: alternate turning to look over your shoulders with one hand support on counter as needed.  Start with head motions only to look in front of shoulder, then even with shoulder and progress to looking behind you. To look to side, move head /eyes, then shoulder on side looking pulls  back, shift more weight to side looking and pull hip back. Place one hand in middle of sink and let go with other hand so your shoulder can pull back. Switch hands to look other way.   Then hover one hand and look over shoulder. If looking right, use left hand at sink. If looking left, use right hand at sink. 6.  Stepping with leg that is not amputated:  Move items under cabinet out of your way. Shift your hips/pelvis so weight on prosthesis. Tighten muscles in hip on prosthetic side.  SLOWLY step other leg so front of foot is in cabinet. Then step back to floor.   Walking Program 4-7 days/week Use rolling walker. Have a resting area close.  See levels below. Progress to next level when current level is not challenge or difficult.  Start walking 5 min, rest 5 min for 2 sets Add 3rd set. So you are walking total of 15 min.  Decrease rest to 4 min. Walk 5 min for 3 sets Walk 6 min, rest 4 min for 3 sets. Decrease rest to 3 min. Walking 6 min for 3 sets Walk 7 min, rest 3 min, 3 sets Walk 10 min, rest 5 min 2 sets Walk 11 min, rest 5 min, 2 sets Walk 12 min, rest 5 min, 2 sets Walk 13 min, rest 5 min, 2 sets Walk 14 min, rest 5 min, 2 sets Walk 15 min, rest 5 min, 2 sets Walk 15 min, rest 4 min, 2 sets Walk 15 min, rest 3 min, 2 sets Walk 15 min, rest 2 min, 2 sets Walk 15 min, rest 1 min, 2 sets Walk 30 min  Nieland walk see above.  Short distances which is walking from one room to another. Work to increase frequency back to prior level. Try with cane.    Medium distances is distance between short & [place]. (More than room to room but not maximum tolerated distance). You may be entering & exiting your home or community with limited distances. Goal is 4 medium walks which may going to one place in community.    ASSESSMENT:  CLINICAL IMPRESSION: He has now met all his PT goals and will be discharged due to his excellent progress.   OBJECTIVE IMPAIRMENTS: Abnormal gait, cardiopulmonary  status limiting activity, decreased activity tolerance, decreased balance, decreased endurance, decreased knowledge of condition, decreased knowledge of use of DME, decreased mobility, difficulty walking, decreased strength, increased edema, prosthetic dependency , obesity, and pain.   ACTIVITY LIMITATIONS: carrying, lifting, bending, standing, stairs, transfers, and locomotion level  PARTICIPATION LIMITATIONS: cleaning, driving, and community activity  PERSONAL FACTORS: Fitness, Time since onset of injury/illness/exacerbation, and 3+ comorbidities: see PMH  are also affecting patient's functional outcome.   REHAB POTENTIAL: Good  CLINICAL DECISION MAKING: Evolving/moderate complexity  EVALUATION COMPLEXITY: Moderate   GOALS: Goals reviewed with patient? Yes  SHORT TERM GOALS: Target date: 10/13/2022  Patient donnes prosthesis modified independent & verbalizes proper cleaning. Baseline: SEE OBJECTIVE DATA Goal status: MET 10/16/2022 2.  Patient tolerates prosthesis >6 hrs total /day without skin issues or limb pain <3/10 after standing. Baseline: SEE OBJECTIVE DATA Goal status:  MET 10/16/2022  3.  Patient able to reach 7" and look over both shoulders without UE support with supervision. Baseline: SEE OBJECTIVE DATA Goal status:  MET 10/16/2022  4. Patient ambulates 18' with RW & prosthesis with supervision. Baseline: SEE OBJECTIVE DATA Goal status:  MET 10/16/2022  5. Patient negotiates ramps & curbs with RW & prosthesis with minA. Baseline: SEE OBJECTIVE DATA Goal status:  MET 10/16/2022  [place] TERM GOALS: Target date: 11/10/2022  Patient demonstrates & verbalized understanding of prosthetic care to enable safe utilization of prosthesis. Baseline: SEE OBJECTIVE DATA Goal status: MET 11/06/22  Patient tolerates prosthesis wear >90% of awake hours without skin or limb pain issues. Baseline: SEE OBJECTIVE DATA Goal status: MET 11/06/22  Berg Balance score >/= 36/56 to indicate  lower fall risk Baseline: SEE OBJECTIVE DATA Goal status: MET, improved to 41 on 11/06/22  Patient ambulates >300' with RW / rollator walker & prosthesis modified independently Baseline: SEE OBJECTIVE DATA Goal status: MET 11/01/22, can do this with [facility]  Patient negotiates ramps, curbs with RW / rollator walker & prosthesis modified independently. Baseline: SEE OBJECTIVE DATA Goal status: MET 11/11/22 can do this with SPC   PLAN:  PT FREQUENCY: 2x/week  PT DURATION: 8 weeks  PLANNED INTERVENTIONS: Therapeutic exercises, Therapeutic activity, Neuromuscular re-education, Balance training, Gait training, Patient/Family education, Self Care, Stair training, Vestibular training, Prosthetic training, DME instructions, and physical performance testing  PLAN FOR NEXT SESSION:DC today   April Manson, PT, [name] 11/06/2022, 3:50 PM

## 2022-11-08 ENCOUNTER — Encounter: Payer: No Typology Code available for payment source | Admitting: Physical Therapy

## 2022-11-09 ENCOUNTER — Ambulatory Visit: Payer: No Typology Code available for payment source | Attending: Internal Medicine | Admitting: Internal Medicine

## 2022-11-09 ENCOUNTER — Encounter: Payer: Self-pay | Admitting: Internal Medicine

## 2022-11-09 VITALS — BP 160/60 | HR 71 | Ht 68.0 in | Wt 212.0 lb

## 2022-11-09 DIAGNOSIS — Z79899 Other long term (current) drug therapy: Secondary | ICD-10-CM

## 2022-11-09 DIAGNOSIS — I5023 Acute on chronic systolic (congestive) heart failure: Secondary | ICD-10-CM | POA: Diagnosis not present

## 2022-11-09 DIAGNOSIS — E785 Hyperlipidemia, unspecified: Secondary | ICD-10-CM

## 2022-11-09 DIAGNOSIS — I2510 Atherosclerotic heart disease of native coronary artery without angina pectoris: Secondary | ICD-10-CM | POA: Diagnosis not present

## 2022-11-09 DIAGNOSIS — E1169 Type 2 diabetes mellitus with other specified complication: Secondary | ICD-10-CM | POA: Diagnosis not present

## 2022-11-09 DIAGNOSIS — Z7984 Long term (current) use of oral hypoglycemic drugs: Secondary | ICD-10-CM

## 2022-11-09 MED ORDER — LOSARTAN POTASSIUM 25 MG PO TABS: 25.0000 mg | ORAL_TABLET | Freq: Every day | ORAL | 3 refills | Status: DC

## 2022-11-09 NOTE — Progress Notes (Signed)
Follow-up Outpatient Visit Date: 11/09/2022  Primary Care Provider: Nelwyn Salisbury, MD [address] [place] [address]  Primary Cardiologist: Dossie Arbour, MD PhD  Chief Complaint: Discuss PCI  HPI:  Mr. Antelo is a 67 y.o. male with history of coronary artery disease, chronic HFrEF with recovered ejection fraction secondary to ischemic cardiomyopathy, PAD status post left BKA, hypertension, hyperlipidemia, type 2 diabetes mellitus, hypothyroidism, and anemia, who resents for discussion of possible percutaneous revascularization of his multivessel coronary artery disease.  He was hospitalized in December with acute HFrEF and NSTEMI.  Right and left heart catheterization at that time showed severe multivessel CAD and moderately elevated filling pressures.  He was treated medically and subsequently referred to cardiac surgery.  He was initially turned down for CABG due to poor targets and comorbidities.  Subsequent cardiac MRI showed improved LVEF (60%) with viability throughout.  Second opinion from cardiac surgery was obtained; it was felt that he was still not a good surgical candidate due to poor targets.  Today, Mr. Tsung reports that he is feeling well.  He is encouraged by his cardiac MRI that showed normalization of his LVEF.  He was fitted with a prosthesis for his left BKA in March and is continuing to improve in regard to his ability to walk.  He notes mild exertional dyspnea when working with physical therapy, though this is continuing to get better.  He has not had any frank chest pain but occasionally feels a little strange in the chest.  This occurs randomly and is not associated with exertion.  He denies palpitations, lightheadedness, and edema.  --------------------------------------------------------------------------------------------------  Past Medical History:  Diagnosis Date   CAD (coronary artery disease)    a. 05/2022 Cath: LM nl, LAD 21m/9m, 70d, D3 90, LCX  31m, 60d, OM1 sev diff dzs, OM2 85p, OM2 subbranch sev diff dzs, RCA 70p/m, 15m.   CHF (congestive heart failure) (HCC)    Chronic HFrEF (heart failure with reduced ejection fraction) (HCC)    a. 05/2022 Echo: EF 25-30%, mild LVH, GrII DD, mid-apical, anteroseptal, and apical HK. Nl RV fxn. RVSP 51.52mmHg. Mildly dil LA. Mod MR/TR. Mild AS/AI.   Diabetes mellitus    Hematospermia    Hypertension    Ischemic cardiomyopathy    a. 05/2022 Echo: EF 25-30%.   Moderate Mitral regurgitation    Moderate tricuspid regurgitation    Nephrolithiasis 02/07/2007   PAD (peripheral artery disease) (HCC)    a. 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   Viral meningitis    Past Surgical History:  Procedure Laterality Date   AMPUTATION Left 05/19/2022   Procedure: AMPUTATION BELOW KNEE;  Surgeon: Renford Dills, MD;  Location: [facility] ORS;  Service: Vascular;  Laterality: Left;   CHOLECYSTECTOMY  12-07-14   laparoscopic, per Dr. Rayburn Ma    LOWER EXTREMITY ANGIOGRAPHY Left 05/16/2022   Procedure: Lower Extremity Angiography;  Surgeon: Renford Dills, MD;  Location: [facility] INVASIVE CV [facility];  Service: Cardiovascular;  Laterality: Left;   RIGHT/LEFT HEART CATH AND CORONARY ANGIOGRAPHY N/A 06/02/2022   Procedure: RIGHT/LEFT HEART CATH AND CORONARY ANGIOGRAPHY;  Surgeon: Yvonne Kendall, MD;  Location: [facility] INVASIVE CV [facility];  Service: Cardiovascular;  Laterality: N/A;    Current Meds  Medication Sig   ACCU-CHEK GUIDE test strip USE TO TEST BLOOD SUGARS TWICE DAILY   Accu-Chek Softclix Lancets lancets daily.   ALPRAZolam (XANAX) 0.5 MG tablet TAKE 1 TABLET (0.5 MG TOTAL) BY MOUTH 3 (THREE) TIMES DAILY AS NEEDED FOR ANXIETY OR  SLEEP.   aspirin EC 81 MG tablet Take 1 tablet (81 mg total) by mouth daily. Swallow whole.   atorvastatin (LIPITOR) 20 MG tablet Take 1 tablet (20 mg total) by mouth daily.   Blood Glucose Monitoring Suppl (ACCU-CHEK GUIDE ME) w/Device KIT USE AS DIRECTED EVERY DAY   clopidogrel (PLAVIX) 75 MG tablet Take  1 tablet (75 mg total) by mouth daily.   Continuous Blood Gluc Sensor (DEXCOM G7 SENSOR) MISC See admin instructions.   dapagliflozin propanediol (FARXIGA) 10 MG TABS tablet Take 1 tablet (10 mg total) by mouth daily.   insulin isophane & regular human KwikPen (NOVOLIN 70/30 KWIKPEN) (70-30) 100 UNIT/ML KwikPen Inject 25 Units into the skin 2 (two) times daily with a meal.   Insulin Pen Needle (BD PEN NEEDLE MICRO U/F) 32G X 6 MM MISC USE AS DIRECTED TO INJECT NOVILIN   levothyroxine (SYNTHROID) 50 MCG tablet TAKE 1 TABLET BY MOUTH EVERY DAY   losartan (COZAAR) 25 MG tablet Take 0.5 tablets (12.5 mg total) by mouth daily.   Melatonin 10 MG TABS Take 1 tablet by mouth at bedtime.   metoprolol succinate (TOPROL-XL) 25 MG 24 hr tablet Take 0.5 tablets (12.5 mg total) by mouth daily.   spironolactone (ALDACTONE) 25 MG tablet Take 1 tablet (25 mg total) by mouth daily.   Torsemide 40 MG TABS Take 40 mg by mouth as needed (AS NEEDED for weight gain of 3 pounds overnight or 5 pounds in a week).   [DISCONTINUED] levothyroxine (SYNTHROID) 50 MCG tablet TAKE 1 TABLET BY MOUTH EVERY DAY    Allergies: Patient has no known allergies.  Social History   Tobacco Use   Smoking status: Former   Smokeless tobacco: Never   Tobacco comments:    22 yrs ago   Substance Use Topics   Alcohol use: No    Alcohol/week: 0.0 standard drinks of alcohol   Drug use: No    Family History  Problem Relation Age of Onset   Rheum arthritis Mother    Diabetes Father    Heart disease Father    Cholecystitis Sister    Cholecystitis Brother    Diabetes Brother    Diabetes Brother    Parkinson's disease Brother    Heart disease Sister    Diabetes Sister    Cholecystitis Sister    Diabetes Sister    Arthritis Other    Hypertension Other    Stroke Other    Coronary artery disease Other    Birth defects Neg Hx     Review of Systems: A 12-system review of systems was performed and was negative except as noted in  the HPI.  --------------------------------------------------------------------------------------------------  Physical Exam: BP (!) 160/60 (BP Location: Left Arm, Patient Position: Sitting, Cuff Size: Normal)   Pulse 71   Ht 5\' 8"  (1.727 m)   Wt 212 lb (96.2 kg)   SpO2 100%   BMI 32.23 kg/m   General:  NAD. Neck: No JVD or HJR. Lungs: Clear to auscultation bilaterally without wheezes or crackles. Heart: Regular rate and rhythm without murmurs, rubs, or gallops. Abdomen: Soft, nontender, nondistended. Extremities: No lower extremity edema.  Lab Results  Component Value Date   WBC 6.7 09/06/2022   HGB 13.9 09/06/2022   HCT 41.3 09/06/2022   MCV 94.6 09/06/2022   PLT 148.0 (L) 09/06/2022    Lab Results  Component Value Date   NA 139 09/06/2022   K 4.6 09/06/2022   CL 104 09/06/2022   CO2 26  09/06/2022   BUN 33 (H) 09/06/2022   CREATININE 1.47 09/06/2022   GLUCOSE 125 (H) 09/06/2022   ALT 43 09/06/2022    Lab Results  Component Value Date   CHOL 93 05/30/2022   HDL 28 (L) 05/30/2022   LDLCALC 51 05/30/2022   TRIG 72 05/30/2022   CHOLHDL 3.3 05/30/2022    --------------------------------------------------------------------------------------------------  ASSESSMENT AND PLAN: Coronary artery disease without angina and chronic HFrEF with recovered ejection fraction: Mr. Bloyer is feeling well without frank angina.  He has some mild exertional dyspnea that could be due to a number of factors including his resolved HFrEF and deconditioning.  He does not report frank angina.  We have reviewed his coronary angiography from December in great detail, at which time he was shown to have significant three-vessel coronary artery disease.  Troponin was elevated that at that time, though this was most likely due to supply-demand mismatch in the setting of fixed CAD and acute HFrEF.  His LVEF has recovered without evidence of scar.  We discussed indications for myocardial  revascularization and potential targets.  Given that his LVEF has normalized and there is no significant clinical improvement in regard to heart failure with revascularization of patients without angina based on the Revived trial, we have agreed to defer intervention at this time in favor of continued escalation of goal-directed medical therapy and secondary prevention of coronary artery disease.  If he were to develop angina or continue to have dyspnea despite further optimization of his heart failure therapy, we could consider PCI.  Primary target would be the LAD; LCx and OM are relatively small but could potentially be intervened upon as well.  Small size and diffuse nature of RCA disease make this vessel poor interventional target.  Due to his suboptimal blood pressure control today, we have agreed to increase losartan to 25 mg daily.  Ongoing titration of his evidence-based heart failure therapy should continue; we will continue current doses of carvedilol, spironolactone, dapagliflozin, and as needed torsemide.  We will check a BMP in about 2 weeks to ensure stable renal function and potassium with escalation of losartan.  Hyperlipidemia associated with type 2 diabetes mellitus: Continue atorvastatin 20 mg daily in the setting of excellent LDL on last check in 05/2022.  We will recheck a lipid panel with blood draw in about 2 weeks.  Ongoing management of DM per Dr. Clent Ridges.  Follow-up: Return to clinic in 3 months.  Yvonne Kendall, MD 11/09/2022 4:56 PM

## 2022-11-09 NOTE — Patient Instructions (Signed)
Medication Instructions:  INCREASE: Losartan to 25 mg by mouth daily  *If you need a refill on your cardiac medications before your next appointment, please call your pharmacy*   Lab Work: Your provider would like for you to return in 2 weeks  to have the following labs drawn: (BMP, Lipid).   Please go to the [facility] entrance and check in at the front desk.  You do not need an appointment.  They are open from 7am-6 pm.  You will need to be fasting.  If you have labs (blood work) drawn today and your tests are completely normal, you will receive your results only by: MyChart Message (if you have MyChart) OR A paper copy in the mail If you have any lab test that is abnormal or we need to change your treatment, we will call you to review the results.   Testing/Procedures: None ordered today   Follow-Up: At [facility], you and your health needs are our priority.  As part of our continuing mission to provide you with exceptional heart care, we have created designated Provider Care Teams.  These Care Teams include your primary Cardiologist (physician) and Advanced Practice Providers (APPs -  Physician Assistants and Nurse Practitioners) who all work together to provide you with the care you need, when you need it.  We recommend signing up for the patient portal called "MyChart".  Sign up information is provided on this After Visit Summary.  MyChart is used to connect with patients for Virtual Visits (Telemedicine).  Patients are able to view lab/test results, encounter notes, upcoming appointments, etc.  Non-urgent messages can be sent to your provider as well.   To learn more about what you can do with MyChart, go to [email or web address].    Your next appointment:   3 month(s)  Provider:   You may see Yvonne Kendall or one of the following Advanced Practice Providers on your designated Care Team:   Nicolasa Ducking, NP Eula Listen, PA-C Cadence Fransico Michael,  PA-C Charlsie Quest, NP

## 2022-11-11 ENCOUNTER — Encounter: Payer: Self-pay | Admitting: Internal Medicine

## 2022-11-11 DIAGNOSIS — E1169 Type 2 diabetes mellitus with other specified complication: Secondary | ICD-10-CM | POA: Insufficient documentation

## 2022-11-18 ENCOUNTER — Other Ambulatory Visit: Payer: Self-pay | Admitting: Family

## 2022-11-20 ENCOUNTER — Telehealth: Payer: Self-pay

## 2022-11-23 ENCOUNTER — Encounter: Payer: Self-pay | Admitting: Emergency Medicine

## 2022-11-23 ENCOUNTER — Other Ambulatory Visit: Payer: Self-pay

## 2022-11-23 ENCOUNTER — Other Ambulatory Visit
Admission: RE | Admit: 2022-11-23 | Discharge: 2022-11-23 | Disposition: A | Discharge status: Home / Self Care | Payer: No Typology Code available for payment source | Source: Home / Self Care | Attending: Internal Medicine | Admitting: Internal Medicine

## 2022-11-23 ENCOUNTER — Emergency Department
Admission: EM | Admit: 2022-11-23 | Discharge: 2022-11-23 | Disposition: A | Discharge status: Home / Self Care | Payer: No Typology Code available for payment source | Attending: Emergency Medicine | Admitting: Emergency Medicine

## 2022-11-23 DIAGNOSIS — Z7902 Long term (current) use of antithrombotics/antiplatelets: Secondary | ICD-10-CM | POA: Diagnosis not present

## 2022-11-23 DIAGNOSIS — I2510 Atherosclerotic heart disease of native coronary artery without angina pectoris: Secondary | ICD-10-CM | POA: Insufficient documentation

## 2022-11-23 DIAGNOSIS — Z79899 Other long term (current) drug therapy: Secondary | ICD-10-CM

## 2022-11-23 DIAGNOSIS — I509 Heart failure, unspecified: Secondary | ICD-10-CM | POA: Insufficient documentation

## 2022-11-23 DIAGNOSIS — S4992XA Unspecified injury of left shoulder and upper arm, initial encounter: Secondary | ICD-10-CM | POA: Diagnosis present

## 2022-11-23 DIAGNOSIS — E119 Type 2 diabetes mellitus without complications: Secondary | ICD-10-CM | POA: Insufficient documentation

## 2022-11-23 DIAGNOSIS — S41132A Puncture wound without foreign body of left upper arm, initial encounter: Secondary | ICD-10-CM | POA: Diagnosis not present

## 2022-11-23 DIAGNOSIS — I110 Hypertensive heart disease with heart failure: Secondary | ICD-10-CM | POA: Insufficient documentation

## 2022-11-23 DIAGNOSIS — X58XXXA Exposure to other specified factors, initial encounter: Secondary | ICD-10-CM | POA: Diagnosis not present

## 2022-11-23 LAB — BASIC METABOLIC PANEL
Anion gap: 10 (ref 5–15)
BUN: 33 mg/dL — ABNORMAL HIGH (ref 8–23)
CO2: 23 mmol/L (ref 22–32)
Calcium: 9.2 mg/dL (ref 8.9–10.3)
Chloride: 107 mmol/L (ref 98–111)
Creatinine, Ser: 1.46 mg/dL — ABNORMAL HIGH (ref 0.61–1.24)
GFR, Estimated: 53 mL/min — ABNORMAL LOW (ref >60)
Glucose, Bld: 161 mg/dL — ABNORMAL HIGH (ref 70–99)
Potassium: 4.7 mmol/L (ref 3.5–5.1)
Sodium: 140 mmol/L (ref 135–145)

## 2022-11-23 LAB — LIPID PANEL
Cholesterol: 84 mg/dL (ref 0–200)
HDL: 37 mg/dL — ABNORMAL LOW (ref >40)
LDL Cholesterol: 33 mg/dL (ref 0–99)
Total CHOL/HDL Ratio: 2.3 RATIO
Triglycerides: 72 mg/dL (ref <150)
VLDL: 14 mg/dL (ref 0–40)

## 2022-11-23 NOTE — Discharge Instructions (Addendum)
Follow-up with your primary care provider if any continued problems or concerns.  Leave the pressure dressing on for 24 hours.  When you first remove it it may look like clotted blood which is the hemostatic dressing that was applied to your arm.  This should prevent any further bleeding and also help with the blood to clot quickly.  Return to the emergency department if any severe worsening of your symptoms or urgent concerns.

## 2022-11-23 NOTE — ED Provider Notes (Signed)
   [facility] Provider Note    None    (approximate)   History   Bleeding from [facility]   HPI  [name] is a 67 y.o. male   presents to the ED with complaint of bleeding from his left arm since last evening.  Patient states that he was attempting to put his Dexcom on his left arm when it began bleeding.  Patient currently is taking Plavix.  He covered it but noted bleeding this morning.  Patient has history of CHF, CAD, diabetes, hypertension, mitral valve and tricuspid valve regurgitation, PAD.      Physical Exam   Triage Vital Signs: ED Triage Vitals [11/23/22 0904]  Enc Vitals Group     BP (!) 146/80     Pulse Rate 90     Resp 18     Temp 97.6 F (36.4 C)     Temp Source Oral     SpO2 100 %     Weight 212 lb (96.2 kg)     Height 5\' 8"  (1.727 m)     Head Circumference      Peak Flow      Pain Score 0     Pain Loc      Pain Edu?      Excl. in GC?     Most recent vital signs: Vitals:   11/23/22 0904  BP: (!) 146/80  Pulse: 90  Resp: 18  Temp: 97.6 F (36.4 C)  SpO2: 100%     General: Awake, no distress.  CV:  Good peripheral perfusion.  Resp:  Normal effort.  Abd:  No distention.  Other:  Left lateral arm with a single puncture wound from his dexcom with minimal slow bleeding when removing the dressing.   ED Results / Procedures / Treatments   Labs (all labs ordered are listed, but only abnormal results are displayed) Labs Reviewed - No data to display    PROCEDURES:  Critical Care performed:   Procedures   MEDICATIONS ORDERED IN ED: Medications - No data to display   IMPRESSION / MDM / ASSESSMENT AND PLAN / ED COURSE  I reviewed the triage vital signs and the nursing notes.   Differential diagnosis includes, but is not limited to, puncture wound, continued bleeding secondary to Plavix.  67 year old male presents to the ED with continued bleeding from his left arm after a small puncture from his  Dexcom from last evening.  A Surgicel pressure dressing was applied and patient was made aware that he should leave this on for 24 hours.  Is to follow-up with his PCP if any continued problems.      Patient's presentation is most consistent with acute complicated illness / injury requiring diagnostic workup.  FINAL CLINICAL IMPRESSION(S) / ED DIAGNOSES   Final diagnoses:  Puncture wound of left upper arm, initial encounter     Rx / DC Orders   ED Discharge Orders     None        Note:  This document was prepared using Dragon voice recognition software and may include unintentional dictation errors.   Tommi Rumps, PA-C 11/23/22 1517    Sharman Cheek, MD 11/23/22 [phone]

## 2022-11-23 NOTE — ED Triage Notes (Signed)
Patient arrives ambulatory by POV with cane c/o left arm bleeding since last night. Patient states he was attempting to put his dexcom on and blood was trickling down his left arm. Patient on plavix.

## 2022-11-27 ENCOUNTER — Encounter: Payer: Self-pay | Admitting: Family

## 2022-11-27 ENCOUNTER — Ambulatory Visit: Payer: No Typology Code available for payment source | Attending: Family | Admitting: Family

## 2022-11-27 VITALS — BP 142/80 | HR 73 | Wt 214.8 lb

## 2022-11-27 DIAGNOSIS — J90 Pleural effusion, not elsewhere classified: Secondary | ICD-10-CM | POA: Insufficient documentation

## 2022-11-27 DIAGNOSIS — I255 Ischemic cardiomyopathy: Secondary | ICD-10-CM | POA: Insufficient documentation

## 2022-11-27 DIAGNOSIS — Z89512 Acquired absence of left leg below knee: Secondary | ICD-10-CM | POA: Insufficient documentation

## 2022-11-27 DIAGNOSIS — R5383 Other fatigue: Secondary | ICD-10-CM | POA: Insufficient documentation

## 2022-11-27 DIAGNOSIS — E039 Hypothyroidism, unspecified: Secondary | ICD-10-CM | POA: Insufficient documentation

## 2022-11-27 DIAGNOSIS — I10 Essential (primary) hypertension: Secondary | ICD-10-CM

## 2022-11-27 DIAGNOSIS — I5042 Chronic combined systolic (congestive) and diastolic (congestive) heart failure: Secondary | ICD-10-CM

## 2022-11-27 DIAGNOSIS — E785 Hyperlipidemia, unspecified: Secondary | ICD-10-CM | POA: Insufficient documentation

## 2022-11-27 DIAGNOSIS — R0602 Shortness of breath: Secondary | ICD-10-CM | POA: Diagnosis present

## 2022-11-27 DIAGNOSIS — I252 Old myocardial infarction: Secondary | ICD-10-CM | POA: Insufficient documentation

## 2022-11-27 DIAGNOSIS — I2510 Atherosclerotic heart disease of native coronary artery without angina pectoris: Secondary | ICD-10-CM | POA: Insufficient documentation

## 2022-11-27 DIAGNOSIS — E1151 Type 2 diabetes mellitus with diabetic peripheral angiopathy without gangrene: Secondary | ICD-10-CM | POA: Insufficient documentation

## 2022-11-27 DIAGNOSIS — I110 Hypertensive heart disease with heart failure: Secondary | ICD-10-CM | POA: Diagnosis not present

## 2022-11-27 DIAGNOSIS — Z7984 Long term (current) use of oral hypoglycemic drugs: Secondary | ICD-10-CM | POA: Diagnosis not present

## 2022-11-27 DIAGNOSIS — E118 Type 2 diabetes mellitus with unspecified complications: Secondary | ICD-10-CM | POA: Diagnosis not present

## 2022-11-27 DIAGNOSIS — Z794 Long term (current) use of insulin: Secondary | ICD-10-CM | POA: Insufficient documentation

## 2022-11-27 DIAGNOSIS — M7989 Other specified soft tissue disorders: Secondary | ICD-10-CM | POA: Insufficient documentation

## 2022-11-27 DIAGNOSIS — Z79899 Other long term (current) drug therapy: Secondary | ICD-10-CM | POA: Diagnosis not present

## 2022-11-27 DIAGNOSIS — I739 Peripheral vascular disease, unspecified: Secondary | ICD-10-CM

## 2022-11-27 DIAGNOSIS — I5022 Chronic systolic (congestive) heart failure: Secondary | ICD-10-CM | POA: Insufficient documentation

## 2022-11-27 NOTE — Patient Instructions (Signed)
Call us in the future if you need us for anything 

## 2022-11-27 NOTE — Progress Notes (Signed)
PCP: [name], MD (last seen 12/23) Primary Cardiologist: Yvonne Kendall, MD (last seen 05/24)  HPI:  [name] is a 67 y.o. male with history of coronary artery disease, chronic HFrEF with recovered ejection fraction secondary to ischemic cardiomyopathy, PAD status post left BKA, hypertension, hyperlipidemia, type 2 diabetes mellitus, hypothyroidism, and anemia. He was hospitalized 12/23 with acute HFrEF and NSTEMI.  Right and left heart catheterization at that time showed severe multivessel CAD and moderately elevated filling pressures.  He was treated medically and subsequently referred to cardiac surgery.  He was initially turned down for CABG due to poor targets and comorbidities.  Subsequent cardiac MRI showed improved LVEF (60%) with viability throughout.  Second opinion from cardiac surgery was obtained; it was felt that he was still not a good surgical candidate due to poor targets.  Echo 02/09/20: EF 55-60% along with mild LVH and Grade I DD. Echo 05/29/22: EF 25-30% along with mild LVH, Grade II DD, moderately elevated PA pressure of 51.45mmHg, mild LAE, moderate left pleural effusion, moderate MR/TR and mild MR.   RHC/LHC 06/02/22: Severe three-vessel coronary artery disease, as detailed below, including sequential 60-70% mid and distal LAD stenoses, sequential 40-60% mid and distal LCx disease, 80-90% lesion involving OM2, and diffusely diseased codominant RCA with 70-90% stenoses in the proximal/mid vessel. Moderately elevated left heart, right heart, and pulmonary artery pressures. Normal cardiac output/index.  cMRI 10/13/22: 1. Normal LV size with mild LV hypertrophy. Normal wall motion, EF 60%.  2.  Normal RV size and systolic function, EF 53%.  3. Small area of < 50% wall thickness late gadolinium enhancement (LGE) at the apical inferior wall. 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 There do not appear to be any nonviable wall segments in the left ventricle.  4.   Normal extracellular volume percentage.  Was in the ED 11/23/22 due to bleeding from dexcom site in left arm. Surgical pressure dressing applied and to leave intact for 24 hours.   He presents today for a HF f/u visit with a chief complaint of minimal SOB with moderate exertion. Chronic in nature but "much better". He can now walk to his mailbox "and then some" without feeling SOB. Has associated fatigue (improving) and very little right leg swelling along with this. Denies cough, chest pain, palpitations, dizziness, weight gain or difficulty sleeping.    Now taking lasix PRN and last took it ~ 2-3 months ago  Left leg has been amputated since last here and he says that he's adjusting to the prosthetic and he feels better than before.    ROS: All systems negative except as listed in HPI, PMH and Problem List.  SH:  Social History   Socioeconomic History   Marital status: Married    Spouse name: Not on file   Number of children: 2   Years of education: Not on file   Highest education level: Not on file  Occupational History   Not on file  Tobacco Use   Smoking status: Former   Smokeless tobacco: Never   Tobacco comments:    22 yrs ago   Substance and Sexual Activity   Alcohol use: No    Alcohol/week: 0.0 standard drinks of alcohol   Drug use: No   Sexual activity: Not on file  Other Topics Concern   Not on file  Social History Narrative   Right Handed   Drinks little caffeine - only diet sodas if so   [facility]  5 Grandchildren   Social Determinants of Health   Financial Resource Strain: Patient Declined (01/01/2022)   Overall Financial Resource Strain (CARDIA)    Difficulty of Paying Living Expenses: Patient declined  Food Insecurity: Patient Declined (05/16/2022)   Hunger Vital Sign    Worried About Running Out of Food in the Last Year: Patient declined    Ran Out of Food in the Last Year: Patient declined  Transportation Needs: Patient Declined (05/16/2022)    PRAPARE - Administrator, Civil Service (Medical): Patient declined    Lack of Transportation (Non-Medical): Patient declined  Physical Activity: Unknown (01/01/2022)   Exercise Vital Sign    Days of Exercise per Week: Patient declined    Minutes of Exercise per Session: Not on file  Stress: Patient Declined (01/01/2022)   [employer] of Occupational Health - Occupational Stress Questionnaire    Feeling of Stress : Patient declined  Social Connections: Unknown (01/01/2022)   Social Connection and Isolation Panel [NHANES]    Frequency of Communication with Friends and Family: Patient declined    Frequency of Social Gatherings with Friends and Family: Patient declined    Attends Religious Services: Patient declined    Database administrator or Organizations: Patient declined    Attends Banker Meetings: Not on file    Marital Status: Patient declined  Intimate Partner Violence: Not At Risk (05/16/2022)   Humiliation, Afraid, Rape, and Kick questionnaire    Fear of Current or Ex-Partner: No    Emotionally Abused: No    Physically Abused: No    Sexually Abused: No    FH:  Family History  Problem Relation Age of Onset   Rheum arthritis Mother    Diabetes Father    Heart disease Father    Cholecystitis Sister    Cholecystitis Brother    Diabetes Brother    Diabetes Brother    Parkinson's disease Brother    Heart disease Sister    Diabetes Sister    Cholecystitis Sister    Diabetes Sister    Arthritis Other    Hypertension Other    Stroke Other    Coronary artery disease Other    Birth defects Neg Hx     Past Medical History:  Diagnosis Date   CAD (coronary artery disease)    a. 05/2022 Cath: LM nl, LAD 17m/68m, 70d, D3 90, LCX 11m, 60d, OM1 sev diff dzs, OM2 85p, OM2 subbranch sev diff dzs, RCA 70p/m, 66m.   CHF (congestive heart failure) (HCC)    Chronic HFrEF (heart failure with reduced ejection fraction) (HCC)    a. 05/2022 Echo: EF  25-30%, mild LVH, GrII DD, mid-apical, anteroseptal, and apical HK. Nl RV fxn. RVSP 51.76mmHg. Mildly dil LA. Mod MR/TR. Mild AS/AI.   Diabetes mellitus    Hematospermia    Hypertension    Ischemic cardiomyopathy    a. 05/2022 Echo: EF 25-30%.   Moderate Mitral regurgitation    Moderate tricuspid regurgitation    Nephrolithiasis 02/07/2007   PAD (peripheral artery disease) (HCC)    a. 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   Viral meningitis     Current Outpatient Medications  Medication Sig Dispense Refill   ACCU-CHEK GUIDE test strip USE TO TEST BLOOD SUGARS TWICE DAILY 200 strip 3   Accu-Chek Softclix Lancets lancets daily.     ALPRAZolam (XANAX) 0.5 MG tablet TAKE 1 TABLET (0.5 MG TOTAL) BY MOUTH 3 (THREE) TIMES DAILY AS NEEDED FOR ANXIETY OR SLEEP. 270 tablet  1   aspirin EC 81 MG tablet Take 1 tablet (81 mg total) by mouth daily. Swallow whole. 30 tablet 12   atorvastatin (LIPITOR) 20 MG tablet Take 1 tablet (20 mg total) by mouth daily. 90 tablet 3   Blood Glucose Monitoring Suppl (ACCU-CHEK GUIDE ME) w/Device KIT USE AS DIRECTED EVERY DAY     clopidogrel (PLAVIX) 75 MG tablet TAKE 1 TABLET BY MOUTH EVERY DAY 90 tablet 1   Continuous Blood Gluc Sensor (DEXCOM G7 SENSOR) MISC See admin instructions.     dapagliflozin propanediol (FARXIGA) 10 MG TABS tablet Take 1 tablet (10 mg total) by mouth daily. 90 tablet 3   insulin isophane & regular human KwikPen (NOVOLIN 70/30 KWIKPEN) (70-30) 100 UNIT/ML KwikPen Inject 25 Units into the skin 2 (two) times daily with a meal. 300 mL 3   Insulin Pen Needle (BD PEN NEEDLE MICRO U/F) 32G X 6 MM MISC USE AS DIRECTED TO INJECT NOVILIN 200 each 1   levothyroxine (SYNTHROID) 50 MCG tablet TAKE 1 TABLET BY MOUTH EVERY DAY 90 tablet 0   losartan (COZAAR) 25 MG tablet Take 1 tablet (25 mg total) by mouth daily. 90 tablet 3   Melatonin 10 MG TABS Take 1 tablet by mouth at bedtime.     metoprolol succinate (TOPROL-XL) 25 MG 24 hr tablet Take 0.5 tablets (12.5 mg total) by  mouth daily. 45 tablet 3   spironolactone (ALDACTONE) 25 MG tablet Take 1 tablet (25 mg total) by mouth daily. 90 tablet 3   Torsemide 40 MG TABS Take 40 mg by mouth as needed (AS NEEDED for weight gain of 3 pounds overnight or 5 pounds in a week). 90 tablet 3   No current facility-administered medications for this visit.   Vitals:   11/27/22 1523 11/27/22 1541  BP: (!) 190/73 (!) 142/80  Pulse: 73   SpO2: 100%   Weight: 214 lb 12.8 oz (97.4 kg)    Wt Readings from Last 3 Encounters:  11/27/22 214 lb 12.8 oz (97.4 kg)  11/23/22 212 lb (96.2 kg)  11/09/22 212 lb (96.2 kg)   Lab Results  Component Value Date   CREATININE 1.46 (H) 11/23/2022   CREATININE 1.47 09/06/2022   CREATININE 1.21 07/19/2022   PHYSICAL EXAM:  General:  Well appearing. No resp difficulty HEENT: normal Neck: supple. JVP flat. No lymphadenopathy or thryomegaly appreciated. Cor: PMI normal. Regular rate & rhythm. No rubs, gallops or murmurs. Lungs: clear Abdomen: soft, nontender, nondistended. No hepatosplenomegaly. No bruits or masses.  Extremities: no cyanosis, clubbing, rash, edema; prosthesis on left lower leg Neuro: alert & orientedx3, cranial nerves grossly intact. Moves all 4 extremities w/o difficulty. Affect pleasant.   ECG: not done   ASSESSMENT & PLAN:  1: Ischemic heart failure with now preserved ejection fraction- - NYHA class II - euvolemic today - weighing daily; reminded to call for an overnight weight gain of > 2 pounds or a weekly weight gain of > 5 pounds - Echo 05/29/22: EF 25-30% along with mild LVH, Grade II DD, moderately elevated PA pressure of 51.61mmHg, mild LAE, moderate left pleural effusion, moderate MR/TR and mild MR.  - Echo 02/09/20: EF 55-60% along with mild LVH and Grade I DD.  - cMRI 10/13/22: 1. Normal LV size with mild LV hypertrophy. Normal wall motion, EF 60%.  2.  Normal RV size and systolic function, EF 53%.  3. Small area of < 50% wall thickness late gadolinium  enhancement (LGE) at the apical inferior wall.  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 There do not appear to be any nonviable wall segments in the left ventricle.  4.  Normal extracellular volume percentage. - not adding salt to his food - continue farxiga 10mg  daily - continue losartan 25mg  daily - continue metoprolol succinate 12.5mg  daily - continue spironolactone 25mg  daily - continue torsemide 40mg  PRN (last took this 2-3 months ago) - drinking 3 water bottles daily (16.9 oz each) - saw cardiology (End) 05/24 - BNP 05/27/22 was 1547.0  2: HTN- - BP initially 190/73 after walking into the office; rechecked with manual cuff was 142/80 - saw PCP Clent Ridges) 12/23 - BMP 11/23/22 reviewed and showed sodium 140, potassium 4.7, creatinine 1.46 & GFR 53  3: DM- - A1c 05/21/22 was 6.7%  4: CAD- - on atorvastatin 20mg  daily - saw cardiothoracic surgeon Leafy Ro) 05/24 - RHC/LHC 06/02/22: Severe three-vessel coronary artery disease, including sequential 60-70% mid and distal LAD stenoses, sequential 40-60% mid and distal LCx disease, 80-90% lesion involving OM2, and diffusely diseased codominant RCA with 70-90% stenoses in the proximal/mid vessel. Moderately elevated left heart, right heart, and pulmonary artery pressures. Normal cardiac output/index.  5: PAD- - left BKA 05/19/22 - saw vascular (Schnier) 04/24  Due to HF stability, will not make a return appointment at this time. Advised patient to follow closely with cardiology but that if he had questions or needed to make another appointment, he could call us back at anytime. He was comfortable with this plan.

## 2022-12-29 NOTE — Telephone Encounter (Signed)
Pt is overdue for f/u. Called pt to try and schedule a f/u w/ Clarisa Kindred, FNP. LVM to return call.

## 2023-01-16 ENCOUNTER — Encounter: Payer: Self-pay | Admitting: Neurology

## 2023-01-16 ENCOUNTER — Ambulatory Visit (INDEPENDENT_AMBULATORY_CARE_PROVIDER_SITE_OTHER): Payer: No Typology Code available for payment source | Admitting: Neurology

## 2023-01-16 VITALS — BP 141/68 | HR 70 | Ht 68.0 in | Wt 215.0 lb

## 2023-01-16 DIAGNOSIS — E1142 Type 2 diabetes mellitus with diabetic polyneuropathy: Secondary | ICD-10-CM

## 2023-01-16 NOTE — Progress Notes (Signed)
Follow-up Visit   Date: 01/16/23   [name] MRN: [id number] DOB: Oct 24, 1955   Interim History: [name] is a 67 y.o. right-handed Caucasian male with diabetes mellitus, hypertension, and nutrient deficiency returning to the clinic for follow-up of neurpathy.  The patient was accompanied to the clinic by self.  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  Exam shows muscle weakness and atrophy in the hands >> right leg. Diabetes has been under excellent control since 2021.  He underwent left BKA in 05/2022 due to nonhealing ulcer.  He completed PT and has been walking well with prosthetic and a cane.  No new complaints. Continue supportive care.   Thiamine and folic acid deficiency neuropathy - Levels have normalized  - Recommend starting multivitamin  Return to clinic as needed  ------------------------------------------- History of present illness: He has been diabetic for the past 20 years.  He ran out of medications for about a year which lead to worsening diabetes with HbA1c is 13.4.  He reports having cold in the feet for the past year and feels as if his hands feel are asleep.  He also has some weakness in the hands and feet. Imbalance started about a month ago.  He has been using a walker for the past several weeks.  He gets episodic sharp pain in the legs which was exacerbated by gabapentin, so stopped it.  He feels that his balance significantly got worse after being hospitalized with COVID. He is not vaccinated.   UPDATE 03/31/2020:  He is here for EDX testing and discuss results.  Since being compliant with medication, his HbA1c has dramatically improved from 13.4 >> 6.0.  He is also trying to improve his diet, but wife feels that he may not be eating enough.  Of note, his labs showed both thiamine and folate deficiency.  He works on a [employer] and has great difficulty with typing since he  cannot feel the keys.  He is on short-term disability and may consider [place]-term disability, if neuropathy does not improve.   He also recently was diagnosed with cataracts and may need surgery because vision is blurred.   UPDATE 08/02/2020:   There has been no progressive weakness or numbness/tingling, but he continues to have baseline weakness and numbness in the hands and feet. His balance and leg strength has improved with PT. He uses a walker and has not had any falls.  He also underwent cataract surgery and returned to work.  He struggles with typing because he cannot tell where the keys are and the pressure applied.  He is planning on appealing his STD.  UPDATE 11/29/2020:  For the last month, he has been using a cane and no longer a walker.  He has not suffered any falls.  There has been no improvement in hand weakness.  He struggles with holding utensils and other fine motor tasks.  He completed PT.  No new complaints.  UPDATE 08/19/2021:  He is here for follow-up visit.  He completed out-patient OT and felt that it helped some with his hand strength.  He still has difficulty opening jars/bottles.  He rarely drops items.  He is better with typing.  He has been working full-time since February 2022 and is planning to retire April 1st. He is able to sense water on his feet and also sensitivity in the hands is better.  No falls, he walks with a cane, but often will walk unassisted around  the home. He is being seen by vascular surgery and wound care for left heel ulcer.  Excellent control of diabetes with last HbA1c 5.2.  UPDATE 01/16/2023:  He is here for 1 year follow-up.  He underwent left BKA in December due to nonhealing ulceration.  He was readmitted later that month with acute on chronic heart failure.  He had adjusted very well to using his prosthetic and after doing PT is walking better, assisted with a cane.  He continues to have numbness in the hands and legs, which has not changed.  Diabetes  remains very well controlled.   Medications:  Current Outpatient Medications on File Prior to Visit  Medication Sig Dispense Refill   ACCU-CHEK GUIDE test strip USE TO TEST BLOOD SUGARS TWICE DAILY 200 strip 3   Accu-Chek Softclix Lancets lancets daily.     ALPRAZolam (XANAX) 0.5 MG tablet TAKE 1 TABLET (0.5 MG TOTAL) BY MOUTH 3 (THREE) TIMES DAILY AS NEEDED FOR ANXIETY OR SLEEP. 270 tablet 1   aspirin EC 81 MG tablet Take 1 tablet (81 mg total) by mouth daily. Swallow whole. 30 tablet 12   atorvastatin (LIPITOR) 20 MG tablet Take 1 tablet (20 mg total) by mouth daily. 90 tablet 3   Blood Glucose Monitoring Suppl (ACCU-CHEK GUIDE ME) w/Device KIT USE AS DIRECTED EVERY DAY     clopidogrel (PLAVIX) 75 MG tablet TAKE 1 TABLET BY MOUTH EVERY DAY 90 tablet 1   Continuous Blood Gluc Sensor (DEXCOM G7 SENSOR) MISC See admin instructions.     dapagliflozin propanediol (FARXIGA) 10 MG TABS tablet Take 1 tablet (10 mg total) by mouth daily. 90 tablet 3   insulin isophane & regular human KwikPen (NOVOLIN 70/30 KWIKPEN) (70-30) 100 UNIT/ML KwikPen Inject 25 Units into the skin 2 (two) times daily with a meal. 300 mL 3   Insulin Pen Needle (BD PEN NEEDLE MICRO U/F) 32G X 6 MM MISC USE AS DIRECTED TO INJECT NOVILIN 200 each 1   levothyroxine (SYNTHROID) 50 MCG tablet TAKE 1 TABLET BY MOUTH EVERY DAY 90 tablet 0   losartan (COZAAR) 25 MG tablet Take 1 tablet (25 mg total) by mouth daily. 90 tablet 3   Melatonin 10 MG TABS Take 1 tablet by mouth at bedtime.     metoprolol succinate (TOPROL-XL) 25 MG 24 hr tablet Take 0.5 tablets (12.5 mg total) by mouth daily. 45 tablet 3   spironolactone (ALDACTONE) 25 MG tablet Take 1 tablet (25 mg total) by mouth daily. 90 tablet 3   Torsemide 40 MG TABS Take 40 mg by mouth as needed (AS NEEDED for weight gain of 3 pounds overnight or 5 pounds in a week). 90 tablet 3   No current facility-administered medications on file prior to visit.    Allergies: No Known  Allergies   Vitals:   01/16/23 0921  BP: (!) 141/68  Pulse: 70  Height: 5\' 8"  (1.727 m)  Weight: 215 lb (97.5 kg)  SpO2: 100%  BMI (Calculated): 32.7    Neurological Exam: MENTAL STATUS including orientation to time, place, person, recent and remote memory, attention span and concentration, language, and fund of knowledge is normal.  Speech is not dysarthric.  CRANIAL NERVES:  Normal conjugate, extra-ocular eye movements in all directions of gaze.  No ptosis. Normal facial sensation.   MOTOR:  Moderate-to-severe bilateral hand atrophy, No fasciculations or abnormal movements.  No pronator drift.   Left BKA  Upper Extremity:  Right  Left  Deltoid  5/5  5/5   Biceps  5/5   5/5   Triceps  5/5   5/5   Wrist extensors  5/5   5/5   Wrist flexors  5/5   5/5   Finger extensors  5/5   5/5   Finger flexors  4/5   4/5   Dorsal interossei  4/5   4/5   Abductor pollicis  5/5   5/5   Tone (Ashworth scale)  0  0   Lower Extremity:  Right  Left  Hip flexors  5/5   5/5   Knee flexors  5/5   5/5   Knee extensors  5/5   5/5   Dorsiflexors  5-/5   N/A  Plantarflexors  5/5   N/A  Toe extensors  4/5   N/A  Tone (Ashworth scale)  0  0    MSRs:                                           Right        Left brachioradialis 2+  2+  biceps 2+  2+  triceps 2+  2+  patellar 1+  N/a  ankle jerk 0  N/A   SENSORY:  Vibration absent at the right ankle, intact at the knee and hands.   COORDINATION/GAIT:  Gait is assisted with cane and left prosthetic  Data: NCS/EMG of the left side 03/31/2020: The electrophysiologic findings are consistent with a severe polyradiculoneuropathy affecting the left side.  Lab Results  Component Value Date   HGBA1C 6.7 (H) 05/21/2022   Last vitamin B12 and Folate Lab Results  Component Value Date   VITAMINB12 >1506 (H) 08/09/2020   FOLATE >23.6 08/09/2020      Thank you for allowing me to participate in patient's care.  If I can answer any additional  questions, I would be pleased to do so.    Sincerely,    Leotis Isham K. Allena Katz, DO

## 2023-01-19 ENCOUNTER — Other Ambulatory Visit: Payer: Self-pay | Admitting: Family Medicine

## 2023-01-19 DIAGNOSIS — E039 Hypothyroidism, unspecified: Secondary | ICD-10-CM

## 2023-02-20 ENCOUNTER — Other Ambulatory Visit: Payer: Self-pay | Admitting: Family Medicine

## 2023-02-20 DIAGNOSIS — E1140 Type 2 diabetes mellitus with diabetic neuropathy, unspecified: Secondary | ICD-10-CM

## 2023-02-21 ENCOUNTER — Ambulatory Visit: Payer: No Typology Code available for payment source | Attending: Internal Medicine | Admitting: Internal Medicine

## 2023-02-21 ENCOUNTER — Encounter: Payer: Self-pay | Admitting: Internal Medicine

## 2023-02-21 ENCOUNTER — Other Ambulatory Visit
Admission: RE | Admit: 2023-02-21 | Discharge: 2023-02-21 | Disposition: A | Discharge status: Home / Self Care | Payer: No Typology Code available for payment source | Attending: Internal Medicine | Admitting: Internal Medicine

## 2023-02-21 VITALS — BP 102/50 | HR 73 | Ht 68.0 in | Wt 219.2 lb

## 2023-02-21 DIAGNOSIS — I10 Essential (primary) hypertension: Secondary | ICD-10-CM

## 2023-02-21 DIAGNOSIS — I5023 Acute on chronic systolic (congestive) heart failure: Secondary | ICD-10-CM

## 2023-02-21 DIAGNOSIS — E1169 Type 2 diabetes mellitus with other specified complication: Secondary | ICD-10-CM | POA: Diagnosis not present

## 2023-02-21 DIAGNOSIS — Z79899 Other long term (current) drug therapy: Secondary | ICD-10-CM | POA: Insufficient documentation

## 2023-02-21 DIAGNOSIS — Z794 Long term (current) use of insulin: Secondary | ICD-10-CM

## 2023-02-21 DIAGNOSIS — I5022 Chronic systolic (congestive) heart failure: Secondary | ICD-10-CM

## 2023-02-21 DIAGNOSIS — I70211 Atherosclerosis of native arteries of extremities with intermittent claudication, right leg: Secondary | ICD-10-CM

## 2023-02-21 DIAGNOSIS — E785 Hyperlipidemia, unspecified: Secondary | ICD-10-CM

## 2023-02-21 DIAGNOSIS — I255 Ischemic cardiomyopathy: Secondary | ICD-10-CM

## 2023-02-21 DIAGNOSIS — I2510 Atherosclerotic heart disease of native coronary artery without angina pectoris: Secondary | ICD-10-CM | POA: Diagnosis not present

## 2023-02-21 LAB — BASIC METABOLIC PANEL
Anion gap: 9 (ref 5–15)
BUN: 38 mg/dL — ABNORMAL HIGH (ref 8–23)
CO2: 19 mmol/L — ABNORMAL LOW (ref 22–32)
Calcium: 8.1 mg/dL — ABNORMAL LOW (ref 8.9–10.3)
Chloride: 108 mmol/L (ref 98–111)
Creatinine, Ser: 1.74 mg/dL — ABNORMAL HIGH (ref 0.61–1.24)
GFR, Estimated: 43 mL/min — ABNORMAL LOW (ref >60)
Glucose, Bld: 174 mg/dL — ABNORMAL HIGH (ref 70–99)
Potassium: 4.1 mmol/L (ref 3.5–5.1)
Sodium: 136 mmol/L (ref 135–145)

## 2023-02-21 NOTE — Progress Notes (Unsigned)
Cardiology Office Note:  .   Date:  02/22/2023  ID:  [name], DOB [birth year], MRN [id number] PCP: Nelwyn Salisbury, MD  [facility] Providers Cardiologist:  Julien Nordmann, MD     History of Present Illness: .   [name] is a 67 y.o. male with history of severe multivessel coronary artery disease not amenable to CABG due to poor targets, chronic HFrEF with recovered ejection fraction secondary to ischemic cardiomyopathy, PAD status post left BKA, hypertension, hyperlipidemia, type 2 diabetes mellitus, hypothyroidism, and anemia, who presents for follow-up of coronary artery disease and heart failure with recovered ejection fraction.  I last saw him in May, at which time he was feeling fairly well.  He noted mild exertional dyspnea when working with physical therapy but denied dyspnea at rest as well as frank chest pain.  We reviewed his coronary angiograms and indications for revascularization in depth and agreed to continue with medical therapy.  We increased losartan to 25 mg daily and continued his previous doses of carvedilol, dapagliflozin, and spironolactone.  Today, [name] reports that he is feeling well.  He continues to have some difficulty finding a sock that fits his left BKA stump and prosthesis well.  He denies chest pain, palpitations, lightheadedness, edema, and falls.  He has exertional dyspnea when walking extended distances but notes that this has improved over the last 6 months.  He only rarely uses prn torsemide (typically once every 1.5 to 2 months).  ROS: See HPI  Studies Reviewed: Marland Kitchen   EKG Interpretation Date/Time:  Wednesday February 21 2023 16:11:35 EDT Ventricular Rate:  73 PR Interval:  164 QRS Duration:  76 QT Interval:  390 QTC Calculation: 429 R Axis:   -8  Text Interpretation: Normal sinus rhythm Septal infarct , age undetermined Inferior infarct , age undetermined When compared with ECG of 23-Jun-2022 Lateral precordial T wave inversions have  resolved Confirmed by Laakea Pereira, Cristal Deer [phone]) on 02/22/2023 2:31:25 PM    Risk Assessment/Calculations:             Physical Exam:   VS:  BP (!) 102/50 (BP Location: Left Arm)   Pulse 73   Ht 5\' 8"  (1.727 m)   Wt 219 lb 4 oz (99.5 kg)   SpO2 99%   BMI 33.34 kg/m    Wt Readings from Last 3 Encounters:  02/21/23 219 lb 4 oz (99.5 kg)  01/16/23 215 lb (97.5 kg)  11/27/22 214 lb 12.8 oz (97.4 kg)    General:  NAD. Neck: No JVD or HJR. Lungs: Clear to auscultation bilaterally without wheezes or crackles. Heart: Regular rate and rhythm without murmurs, rubs, or gallops. Abdomen: Soft, nontender, nondistended. Extremities: Left BKA noted with prosthesis in place.  No RLE edema.  ASSESSMENT AND PLAN: .    Coronary artery disease and chronic HFrEF with recovered ejection fraction: [name] continues to improve, with less dyspnea on exertion over the last 6 months.  He denies angina.  We have agreed to continue with medical and defer PCI, given lack of angina.  I will check a BMP today to ensure that his renal function and electrolytes are stable in the setting of [place]-term losartan and spironolactone use.  Given his soft BP, I am reluctant to escalate his GDMT today; continue current regimen of dapagliflozin, losartan, metoprolol, succinate, and spironolactone.  Hyperlipidemia associated with type 2 diabetes mellitus: Continue atorvastatin 20 mg daily in the setting of excellent LDL control (LDL 33 in 11/2022).  Dispo: Return to clinic with me or Dr. Mariah Milling in 6 months.  Signed, Yvonne Kendall, MD

## 2023-02-21 NOTE — Patient Instructions (Addendum)
Medication Instructions:  Your physician recommends that you continue on your current medications as directed. Please refer to the Current Medication list given to you today.   *If you need a refill on your cardiac medications before your next appointment, please call your pharmacy*   Lab Work: Your provider would like for you to have following labs drawn today (BMP).     Testing/Procedures: No test ordered today    Follow-Up: At [facility], you and your health needs are our priority.  As part of our continuing mission to provide you with exceptional heart care, we have created designated Provider Care Teams.  These Care Teams include your primary Cardiologist (physician) and Advanced Practice Providers (APPs -  Physician Assistants and Nurse Practitioners) who all work together to provide you with the care you need, when you need it.  We recommend signing up for the patient portal called "MyChart".  Sign up information is provided on this After Visit Summary.  MyChart is used to connect with patients for Virtual Visits (Telemedicine).  Patients are able to view lab/test results, encounter notes, upcoming appointments, etc.  Non-urgent messages can be sent to your provider as well.   To learn more about what you can do with MyChart, go to [email or web address].    Your next appointment:   6 month(s)  Provider:   You may see Yvonne Kendall, MD or Julien Nordmann, MD

## 2023-02-22 ENCOUNTER — Other Ambulatory Visit: Payer: Self-pay

## 2023-02-22 ENCOUNTER — Encounter: Payer: Self-pay | Admitting: Internal Medicine

## 2023-02-22 DIAGNOSIS — Z79899 Other long term (current) drug therapy: Secondary | ICD-10-CM

## 2023-02-24 ENCOUNTER — Other Ambulatory Visit: Payer: Self-pay | Admitting: Family Medicine

## 2023-02-26 ENCOUNTER — Encounter: Payer: Self-pay | Admitting: Family Medicine

## 2023-03-02 MED ORDER — NOVOLIN 70/30 FLEXPEN (70-30) 100 UNIT/ML ~~LOC~~ SUPN: PEN_INJECTOR | SUBCUTANEOUS | 39 refills | Status: DC

## 2023-03-02 NOTE — Telephone Encounter (Signed)
Please call in a corrected RX for Novolin 70/30 to take 25 units BID, 90 day supply with 3 rf

## 2023-03-07 ENCOUNTER — Other Ambulatory Visit
Admission: RE | Admit: 2023-03-07 | Discharge: 2023-03-07 | Disposition: A | Discharge status: Home / Self Care | Payer: No Typology Code available for payment source | Attending: Internal Medicine | Admitting: Internal Medicine

## 2023-03-07 DIAGNOSIS — Z79899 Other long term (current) drug therapy: Secondary | ICD-10-CM | POA: Diagnosis present

## 2023-03-07 LAB — BASIC METABOLIC PANEL WITH GFR
Anion gap: 9 (ref 5–15)
BUN: 21 mg/dL (ref 8–23)
CO2: 26 mmol/L (ref 22–32)
Calcium: 9.1 mg/dL (ref 8.9–10.3)
Chloride: 104 mmol/L (ref 98–111)
Creatinine, Ser: 1.29 mg/dL — ABNORMAL HIGH (ref 0.61–1.24)
GFR, Estimated: >60 mL/min (ref >60)
Glucose, Bld: 168 mg/dL — ABNORMAL HIGH (ref 70–99)
Potassium: 4.2 mmol/L (ref 3.5–5.1)
Sodium: 139 mmol/L (ref 135–145)

## 2023-03-30 ENCOUNTER — Other Ambulatory Visit: Payer: Self-pay | Admitting: Family Medicine

## 2023-04-06 ENCOUNTER — Ambulatory Visit (INDEPENDENT_AMBULATORY_CARE_PROVIDER_SITE_OTHER): Payer: No Typology Code available for payment source | Admitting: Nurse Practitioner

## 2023-04-06 ENCOUNTER — Ambulatory Visit (INDEPENDENT_AMBULATORY_CARE_PROVIDER_SITE_OTHER): Payer: No Typology Code available for payment source

## 2023-04-06 ENCOUNTER — Other Ambulatory Visit: Payer: Self-pay | Admitting: Family Medicine

## 2023-04-06 ENCOUNTER — Encounter: Payer: Self-pay | Admitting: Family Medicine

## 2023-04-06 DIAGNOSIS — Z9889 Other specified postprocedural states: Secondary | ICD-10-CM

## 2023-04-06 DIAGNOSIS — I10 Essential (primary) hypertension: Secondary | ICD-10-CM

## 2023-04-06 DIAGNOSIS — Z89512 Acquired absence of left leg below knee: Secondary | ICD-10-CM

## 2023-04-06 DIAGNOSIS — I70211 Atherosclerosis of native arteries of extremities with intermittent claudication, right leg: Secondary | ICD-10-CM | POA: Diagnosis not present

## 2023-04-06 DIAGNOSIS — I739 Peripheral vascular disease, unspecified: Secondary | ICD-10-CM

## 2023-04-06 DIAGNOSIS — E039 Hypothyroidism, unspecified: Secondary | ICD-10-CM

## 2023-04-07 ENCOUNTER — Encounter (INDEPENDENT_AMBULATORY_CARE_PROVIDER_SITE_OTHER): Payer: Self-pay | Admitting: Nurse Practitioner

## 2023-04-07 NOTE — Progress Notes (Signed)
Subjective:    Patient ID: [name], male    DOB: 1955-08-10, 67 y.o.   MRN: [id number] No chief complaint on file.   Alp Faucette is a 67 year old male that returns today for evaluation for his peripheral arterial disease as well as his left below-knee amputation.  The patient is currently doing well with his prosthetic.  He is walking with a cane and without any significant issues.  He previously had a wound on his right toe following a traumatic incident but he notes that it is healing.  Currently denies claudication like symptoms or rest pain.  Overall he is progressing well.  He has a right ABI of 1.00 and a TBI 0.58.  He has good tibial artery waveforms that are biphasic with good toe waveforms on the right    Review of Systems  Skin:  Positive for wound.  All other systems reviewed and are negative.      Objective:   Physical Exam Vitals reviewed.  HENT:     Head: Normocephalic.  Cardiovascular:     Rate and Rhythm: Normal rate.     Pulses:          Dorsalis pedis pulses are detected w/ Doppler on the right side.       Posterior tibial pulses are detected w/ Doppler on the right side.  Pulmonary:     Effort: Pulmonary effort is normal.  Musculoskeletal:     Left Lower Extremity: Left leg is amputated below knee.  Skin:    General: Skin is warm and dry.  Neurological:     Mental Status: He is alert and oriented to person, place, and time.  Psychiatric:        Mood and Affect: Mood normal.        Behavior: Behavior normal.        Thought Content: Thought content normal.        Judgment: Judgment normal.     There were no vitals taken for this visit.  Past Medical History:  Diagnosis Date   CAD (coronary artery disease)    a. 05/2022 Cath: LM nl, LAD 70m/74m, 70d, D3 90, LCX 14m, 60d, OM1 sev diff dzs, OM2 85p, OM2 subbranch sev diff dzs, RCA 70p/m, 51m.   CHF (congestive heart failure) (HCC)    Chronic HFrEF (heart failure with reduced ejection fraction) (HCC)     a. 05/2022 Echo: EF 25-30%, mild LVH, GrII DD, mid-apical, anteroseptal, and apical HK. Nl RV fxn. RVSP 51.51mmHg. Mildly dil LA. Mod MR/TR. Mild AS/AI.   Diabetes mellitus    Hematospermia    Hypertension    Ischemic cardiomyopathy    a. 05/2022 Echo: EF 25-30%.   Moderate Mitral regurgitation    Moderate tricuspid regurgitation    Nephrolithiasis 02/07/2007   PAD (peripheral artery disease) (HCC)    a. 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   Viral meningitis     Social History   Socioeconomic History   Marital status: Married    Spouse name: Not on file   Number of children: 2   Years of education: Not on file   Highest education level: Not on file  Occupational History   Not on file  Tobacco Use   Smoking status: Former   Smokeless tobacco: Never   Tobacco comments:    22 yrs ago   Substance and Sexual Activity   Alcohol use: No    Alcohol/week: 0.0 standard drinks of alcohol   Drug use: No  Sexual activity: Not on file  Other Topics Concern   Not on file  Social History Narrative   Right Handed   Drinks little caffeine - only diet sodas if so   One Story Home    5 Grandchildren   Social Determinants of Health   Financial Resource Strain: Patient Declined (01/01/2022)   Overall Financial Resource Strain (CARDIA)    Difficulty of Paying Living Expenses: Patient declined  Food Insecurity: Patient Declined (05/16/2022)   Hunger Vital Sign    Worried About Running Out of Food in the Last Year: Patient declined    Ran Out of Food in the Last Year: Patient declined  Transportation Needs: Patient Declined (05/16/2022)   PRAPARE - Administrator, Civil Service (Medical): Patient declined    Lack of Transportation (Non-Medical): Patient declined  Physical Activity: Unknown (01/01/2022)   Exercise Vital Sign    Days of Exercise per Week: Patient declined    Minutes of Exercise per Session: Not on file  Stress: Patient Declined (01/01/2022)   [employer] of  Occupational Health - Occupational Stress Questionnaire    Feeling of Stress : Patient declined  Social Connections: Unknown (01/01/2022)   Social Connection and Isolation Panel [NHANES]    Frequency of Communication with Friends and Family: Patient declined    Frequency of Social Gatherings with Friends and Family: Patient declined    Attends Religious Services: Patient declined    Active Member of Clubs or Organizations: Patient declined    Attends Banker Meetings: Not on file    Marital Status: Patient declined  Intimate Partner Violence: Not At Risk (05/16/2022)   Humiliation, Afraid, Rape, and Kick questionnaire    Fear of Current or Ex-Partner: No    Emotionally Abused: No    Physically Abused: No    Sexually Abused: No    Past Surgical History:  Procedure Laterality Date   AMPUTATION Left 05/19/2022   Procedure: AMPUTATION BELOW KNEE;  Surgeon: Renford Dills, MD;  Location: [facility] ORS;  Service: Vascular;  Laterality: Left;   CHOLECYSTECTOMY  12-07-14   laparoscopic, per Dr. Rayburn Ma    LOWER EXTREMITY ANGIOGRAPHY Left 05/16/2022   Procedure: Lower Extremity Angiography;  Surgeon: Renford Dills, MD;  Location: [facility] INVASIVE CV LAB;  Service: Cardiovascular;  Laterality: Left;   RIGHT/LEFT HEART CATH AND CORONARY ANGIOGRAPHY N/A 06/02/2022   Procedure: RIGHT/LEFT HEART CATH AND CORONARY ANGIOGRAPHY;  Surgeon: Yvonne Kendall, MD;  Location: [facility] INVASIVE CV LAB;  Service: Cardiovascular;  Laterality: N/A;    Family History  Problem Relation Age of Onset   Rheum arthritis Mother    Diabetes Father    Heart disease Father    Cholecystitis Sister    Cholecystitis Brother    Diabetes Brother    Diabetes Brother    Parkinson's disease Brother    Heart disease Sister    Diabetes Sister    Cholecystitis Sister    Diabetes Sister    Arthritis Other    Hypertension Other    Stroke Other    Coronary artery disease Other    Birth defects Neg Hx     No  Known Allergies     Latest Ref Rng & Units 09/06/2022    8:47 AM 06/14/2022   11:52 AM 06/04/2022    5:37 AM  CBC  WBC 4.0 - 10.5 K/uL 6.7  7.4  7.3   Hemoglobin 13.0 - 17.0 g/dL 65.7  84.6  9.1   Hematocrit 39.0 -  52.0 % 41.3  37.6  28.9   Platelets 150.0 - 400.0 K/uL 148.0  160.0  224       CMP     Component Value Date/Time   NA 139 03/07/2023 0949   K 4.2 03/07/2023 0949   CL 104 03/07/2023 0949   CO2 26 03/07/2023 0949   GLUCOSE 168 (H) 03/07/2023 0949   BUN 21 03/07/2023 0949   CREATININE 1.29 (H) 03/07/2023 0949   CREATININE 1.31 (H) 03/15/2020 1700   CALCIUM 9.1 03/07/2023 0949   PROT 7.7 09/06/2022 0847   ALBUMIN 3.9 09/06/2022 0847   AST 37 09/06/2022 0847   ALT 43 09/06/2022 0847   ALKPHOS 104 09/06/2022 0847   BILITOT 0.9 09/06/2022 0847   GFR 50.02 (L) 09/06/2022 0847   GFRNONAA >60 03/07/2023 0949     No results found.     Assessment & Plan:   1. Peripheral arterial disease with history of revascularization (HCC)  Recommend:  The patient has evidence of atherosclerosis of the lower extremities with no claudication.  The patient does not voice lifestyle limiting changes at this point in time.  Noninvasive studies do not suggest clinically significant change.  No invasive studies, angiography or surgery at this time The patient should continue walking and begin a more formal exercise program.  The patient should continue antiplatelet therapy and aggressive treatment of the lipid abnormalities  No changes in the patient's medications at this time  Continued surveillance is indicated as atherosclerosis is likely to progress with time.    The patient will continue follow up with noninvasive studies as ordered.   2. Hx of BKA, left (HCC) The patient is doing well with his left below-knee amputation.  He is well-healed.  Ambulating without any significant difficulty.  3. Essential hypertension Continue antihypertensive medications as already  ordered, these medications have been reviewed and there are no changes at this time.   Current Outpatient Medications on File Prior to Visit  Medication Sig Dispense Refill   ACCU-CHEK GUIDE test strip USE TO TEST BLOOD SUGARS TWICE DAILY 200 strip 3   Accu-Chek Softclix Lancets lancets daily.     ALPRAZolam (XANAX) 0.5 MG tablet TAKE 1 TABLET (0.5 MG TOTAL) BY MOUTH 3 (THREE) TIMES DAILY AS NEEDED FOR ANXIETY OR SLEEP. 270 tablet 1   aspirin EC 81 MG tablet Take 1 tablet (81 mg total) by mouth daily. Swallow whole. 30 tablet 12   atorvastatin (LIPITOR) 20 MG tablet Take 1 tablet (20 mg total) by mouth daily. 90 tablet 3   B-D ULTRAFINE III SHORT PEN 31G X 8 MM MISC USE AS DIRECTED TO INJECT NOVILIN 200 each 1   Blood Glucose Monitoring Suppl (ACCU-CHEK GUIDE ME) w/Device KIT USE AS DIRECTED EVERY DAY     clopidogrel (PLAVIX) 75 MG tablet TAKE 1 TABLET BY MOUTH EVERY DAY 90 tablet 1   Continuous Glucose Sensor (DEXCOM G7 SENSOR) MISC USE AS DIRECTED 9 each 3   dapagliflozin propanediol (FARXIGA) 10 MG TABS tablet Take 1 tablet (10 mg total) by mouth daily. 90 tablet 3   insulin isophane & regular human KwikPen (NOVOLIN 70/30 KWIKPEN) (70-30) 100 UNIT/ML KwikPen INJECT 25 UNITS INTO THE SKIN 2 (TWO) TIMES DAILY WITH A MEAL. 15 mL 39   levothyroxine (SYNTHROID) 50 MCG tablet TAKE 1 TABLET BY MOUTH EVERY DAY 90 tablet 0   losartan (COZAAR) 25 MG tablet Take 1 tablet (25 mg total) by mouth daily. 90 tablet 3   Melatonin 10 MG  TABS Take 1 tablet by mouth at bedtime.     metoprolol succinate (TOPROL-XL) 25 MG 24 hr tablet Take 0.5 tablets (12.5 mg total) by mouth daily. 45 tablet 3   Torsemide 40 MG TABS Take 40 mg by mouth as needed (AS NEEDED for weight gain of 3 pounds overnight or 5 pounds in a week). 90 tablet 3   No current facility-administered medications on file prior to visit.    There are no Patient Instructions on file for this visit. No follow-ups on file.   Georgiana Spinner,  NP

## 2023-04-08 ENCOUNTER — Encounter: Payer: Self-pay | Admitting: Internal Medicine

## 2023-04-08 DIAGNOSIS — E1140 Type 2 diabetes mellitus with diabetic neuropathy, unspecified: Secondary | ICD-10-CM

## 2023-04-09 MED ORDER — BD PEN NEEDLE SHORT U/F 31G X 8 MM MISC
1.0000 | Freq: Two times a day (BID) | 1 refills | Status: DC
Start: 2023-04-09 — End: 2023-10-15

## 2023-04-16 LAB — VAS US ABI WITH/WO TBI: Right ABI: 1

## 2023-05-16 ENCOUNTER — Other Ambulatory Visit: Payer: Self-pay | Admitting: Family

## 2023-05-23 ENCOUNTER — Other Ambulatory Visit: Payer: Self-pay | Admitting: Family

## 2023-05-23 ENCOUNTER — Other Ambulatory Visit: Payer: Self-pay | Admitting: Cardiology

## 2023-05-24 ENCOUNTER — Other Ambulatory Visit: Payer: Self-pay

## 2023-05-24 MED ORDER — ASPIRIN 81 MG PO TBEC: 81.0000 mg | DELAYED_RELEASE_TABLET | Freq: Every day | ORAL | 11 refills | Status: DC

## 2023-05-31 ENCOUNTER — Emergency Department
Admission: EM | Admit: 2023-05-31 | Discharge: 2023-05-31 | Discharge status: Against Medical Advice | Payer: Medicare Other | Attending: Student | Admitting: Student

## 2023-05-31 ENCOUNTER — Other Ambulatory Visit: Payer: Self-pay

## 2023-05-31 ENCOUNTER — Emergency Department: Payer: Medicare Other

## 2023-05-31 DIAGNOSIS — R519 Headache, unspecified: Secondary | ICD-10-CM | POA: Insufficient documentation

## 2023-05-31 DIAGNOSIS — R5383 Other fatigue: Secondary | ICD-10-CM | POA: Diagnosis not present

## 2023-05-31 DIAGNOSIS — R9082 White matter disease, unspecified: Secondary | ICD-10-CM | POA: Diagnosis not present

## 2023-05-31 DIAGNOSIS — Z5321 Procedure and treatment not carried out due to patient leaving prior to being seen by health care provider: Secondary | ICD-10-CM | POA: Insufficient documentation

## 2023-05-31 LAB — PROTIME-INR
INR: 1.1 (ref 0.8–1.2)
Prothrombin Time: 14.3 s (ref 11.4–15.2)

## 2023-05-31 LAB — COMPREHENSIVE METABOLIC PANEL
ALT: 36 U/L (ref 0–44)
AST: 41 U/L (ref 15–41)
Albumin: 3.8 g/dL (ref 3.5–5.0)
Alkaline Phosphatase: 96 U/L (ref 38–126)
Anion gap: 9 (ref 5–15)
BUN: 29 mg/dL — ABNORMAL HIGH (ref 8–23)
CO2: 23 mmol/L (ref 22–32)
Calcium: 8.6 mg/dL — ABNORMAL LOW (ref 8.9–10.3)
Chloride: 102 mmol/L (ref 98–111)
Creatinine, Ser: 1.55 mg/dL — ABNORMAL HIGH (ref 0.61–1.24)
GFR, Estimated: 49 mL/min — ABNORMAL LOW (ref >60)
Glucose, Bld: 294 mg/dL — ABNORMAL HIGH (ref 70–99)
Potassium: 4.3 mmol/L (ref 3.5–5.1)
Sodium: 134 mmol/L — ABNORMAL LOW (ref 135–145)
Total Bilirubin: 1.4 mg/dL — ABNORMAL HIGH (ref <1.2)
Total Protein: 7.9 g/dL (ref 6.5–8.1)

## 2023-05-31 LAB — DIFFERENTIAL
Abs Immature Granulocytes: 0.04 10*3/uL (ref 0.00–0.07)
Basophils Absolute: 0.1 10*3/uL (ref 0.0–0.1)
Basophils Relative: 1 %
Eosinophils Absolute: 0.3 10*3/uL (ref 0.0–0.5)
Eosinophils Relative: 3 %
Immature Granulocytes: 1 %
Lymphocytes Relative: 16 %
Lymphs Abs: 1.4 10*3/uL (ref 0.7–4.0)
Monocytes Absolute: 0.5 10*3/uL (ref 0.1–1.0)
Monocytes Relative: 6 %
Neutro Abs: 6.4 10*3/uL (ref 1.7–7.7)
Neutrophils Relative %: 73 %

## 2023-05-31 LAB — CBC
HCT: 44.1 % (ref 39.0–52.0)
Hemoglobin: 15.1 g/dL (ref 13.0–17.0)
MCH: 31.3 pg (ref 26.0–34.0)
MCHC: 34.2 g/dL (ref 30.0–36.0)
MCV: 91.3 fL (ref 80.0–100.0)
Platelets: 183 10*3/uL (ref 150–400)
RBC: 4.83 MIL/uL (ref 4.22–5.81)
RDW: 13.1 % (ref 11.5–15.5)
WBC: 8.6 10*3/uL (ref 4.0–10.5)
nRBC: 0 % (ref 0.0–0.2)

## 2023-05-31 LAB — APTT: aPTT: 30 s (ref 24–36)

## 2023-05-31 LAB — ETHANOL: Alcohol, Ethyl (B): <10 mg/dL (ref <10)

## 2023-05-31 MED ORDER — SODIUM CHLORIDE 0.9% FLUSH: 3.0000 mL | Freq: Once | INTRAVENOUS | Status: DC

## 2023-05-31 NOTE — ED Triage Notes (Addendum)
Wife sts that pt has been having a headache for the last week. Wife sts that pt was lethargic this AM and not able to talk or walk at 1245. PT currently at this time is back to baseline and is A/Ox4.

## 2023-06-01 ENCOUNTER — Telehealth: Payer: Self-pay | Admitting: Family Medicine

## 2023-06-01 ENCOUNTER — Other Ambulatory Visit: Payer: Self-pay

## 2023-06-01 ENCOUNTER — Emergency Department
Admission: EM | Admit: 2023-06-01 | Discharge: 2023-06-01 | Discharge status: Against Medical Advice | Payer: Medicare Other | Attending: Emergency Medicine | Admitting: Emergency Medicine

## 2023-06-01 DIAGNOSIS — Z5321 Procedure and treatment not carried out due to patient leaving prior to being seen by health care provider: Secondary | ICD-10-CM | POA: Diagnosis not present

## 2023-06-01 DIAGNOSIS — I10 Essential (primary) hypertension: Secondary | ICD-10-CM | POA: Insufficient documentation

## 2023-06-01 DIAGNOSIS — R519 Headache, unspecified: Secondary | ICD-10-CM | POA: Diagnosis present

## 2023-06-01 NOTE — ED Notes (Signed)
Pt requesting to know estimated wait time. RN educated that she was unable to provide wait times. Encouraged pt to stay to complete evaluation. Pt states he is going to leave and follow up with PCP and cardiologist. Pt encouraged to return with any new or worsening symptoms/concerns. Pt visualized ambulating from lobby with independent and steady gait with wife.

## 2023-06-01 NOTE — Telephone Encounter (Signed)
FYI

## 2023-06-01 NOTE — ED Triage Notes (Signed)
Pt comes with c/o of headache. Pt states he came yesterday for same and HTN. Pt was triaged and had CT scan done. Pt left before seeing doc. Pt called the nurse line today bc of the continued symptoms. Pt was advised to come back to ED.  Pt states he does take Bp meds and took them today

## 2023-06-01 NOTE — Telephone Encounter (Signed)
Kim triage nurse is calling and pt had headache x 1 wk and speech issue and went to ER 05-31-2023 and left against medical advice and now pt speech is back and has nausea and Selena Batten wanted pt to be seen within 24 hrs no opening.  I started to schedule  the patient for Monday but after talking with  Selena Batten she is going to tell patient to go back to ER.

## 2023-06-04 ENCOUNTER — Ambulatory Visit (INDEPENDENT_AMBULATORY_CARE_PROVIDER_SITE_OTHER): Payer: Medicare Other | Admitting: Family Medicine

## 2023-06-04 ENCOUNTER — Encounter: Payer: Self-pay | Admitting: Family Medicine

## 2023-06-04 VITALS — BP 130/62 | HR 71 | Temp 98.0°F | Wt 224.0 lb

## 2023-06-04 DIAGNOSIS — Z794 Long term (current) use of insulin: Secondary | ICD-10-CM | POA: Diagnosis not present

## 2023-06-04 DIAGNOSIS — I10 Essential (primary) hypertension: Secondary | ICD-10-CM | POA: Diagnosis not present

## 2023-06-04 DIAGNOSIS — N189 Chronic kidney disease, unspecified: Secondary | ICD-10-CM

## 2023-06-04 DIAGNOSIS — N1831 Chronic kidney disease, stage 3a: Secondary | ICD-10-CM

## 2023-06-04 DIAGNOSIS — E1122 Type 2 diabetes mellitus with diabetic chronic kidney disease: Secondary | ICD-10-CM

## 2023-06-04 DIAGNOSIS — I5020 Unspecified systolic (congestive) heart failure: Secondary | ICD-10-CM

## 2023-06-04 DIAGNOSIS — E118 Type 2 diabetes mellitus with unspecified complications: Secondary | ICD-10-CM

## 2023-06-04 DIAGNOSIS — I255 Ischemic cardiomyopathy: Secondary | ICD-10-CM

## 2023-06-04 LAB — POCT GLYCOSYLATED HEMOGLOBIN (HGB A1C): Hemoglobin A1C: 7.8 % — AB (ref 4.0–5.6)

## 2023-06-04 MED ORDER — NOVOLIN 70/30 FLEXPEN (70-30) 100 UNIT/ML ~~LOC~~ SUPN: PEN_INJECTOR | SUBCUTANEOUS | Status: DC

## 2023-06-04 MED ORDER — SPIRONOLACTONE 25 MG PO TABS: 25.0000 mg | ORAL_TABLET | Freq: Every day | ORAL | Status: DC

## 2023-06-04 MED ORDER — DAPAGLIFLOZIN PROPANEDIOL 10 MG PO TABS: 10.0000 mg | ORAL_TABLET | Freq: Every day | ORAL | 3 refills | Status: DC

## 2023-06-04 MED ORDER — METOPROLOL SUCCINATE ER 25 MG PO TB24: 25.0000 mg | ORAL_TABLET | Freq: Every day | ORAL | Status: DC

## 2023-06-04 NOTE — Progress Notes (Signed)
   Subjective:    Patient ID: [name], male    DOB: 1955/08/31, 67 y.o.   MRN: [id number]  HPI Here with his wife to follow up on two ED visits recently, one on 05-31-23 and again on 06-01-23. He went in because he had been getting extremely high BP readings at home and he was having bad headaches. No SOB or chest pain. Each time he waited for a number of hours and then left without being seen by a provider. His BP on the first visit was 144/75 and this went up to 209/98 on the second visit. On the 05-31-23 visit he had a head CT taken which showed microvascular ischemia but no changes from his baseline. He had labs drawn that day that were significant for creatinine 1.55, GFR 49, Na 134, and K 4.3.  He notes that he had been directed by his cardiologist, Dr. Okey Dupre, in September to stop taking Spironolactone. Now for the past 2 days he has resumed taking 25 mg of Spironolactone each morning, and his BP has been back to normal. His headaches stopped and today he feels fine. The other issue is his diabetes. His glucoses at home over the past month have averaged 150-250. His A1c today is 7.8%.   Review of Systems  Constitutional: Negative.   Respiratory: Negative.    Cardiovascular: Negative.   Gastrointestinal: Negative.   Genitourinary: Negative.   Neurological:  Positive for headaches. Negative for dizziness, tremors, seizures, syncope, facial asymmetry, speech difficulty, weakness, light-headedness and numbness.       Objective:   Physical Exam Constitutional:      Appearance: He is not ill-appearing.     Comments: Walks with a cane   Cardiovascular:     Rate and Rhythm: Normal rate and regular rhythm.     Pulses: Normal pulses.     Heart sounds: Normal heart sounds.  Pulmonary:     Effort: Pulmonary effort is normal.     Breath sounds: Normal breath sounds.  Neurological:     Mental Status: He is alert and oriented to person, place, and time. Mental status is at baseline.            Assessment & Plan:  His HTN had been out of control off Spironolactone, so I agreed with him starting back on it daily. It seems to be stable today. We will also increase the Metoprolol succinate to taking a whole 25 mg pill daily. He has some mild CKD, so we will refer him to Nephrology to evaluate further. His diabetes has not been well controlled so we will increase the 70/30 insulin to 30 units BID before meals. He will monitor the BP and the glucoses and report back to [place] in one week. We spent a total of ( 35  ) minutes reviewing records and discussing these issues.  [name], MD

## 2023-06-07 ENCOUNTER — Other Ambulatory Visit: Payer: Self-pay | Admitting: Physician Assistant

## 2023-06-07 ENCOUNTER — Ambulatory Visit: Payer: Medicare Other | Admitting: Internal Medicine

## 2023-06-07 NOTE — Telephone Encounter (Signed)
Hi,  This patient is a patient of Dr. Mariah Milling. The patient was seen by Dr. Okey Dupre on 02-21-2023 and he ordered some lab work. On 03-07-2023, Dr. Okey Dupre advised the patient to stop his spironolactone. The patient was seen by his PCP on 06-04-2023 and his PCP advised the patient to restart the spironolactone. Please advise if ok to send refill to pharmacy or defer to PCP. Thank you.

## 2023-06-07 NOTE — Telephone Encounter (Signed)
Please advise if ok to refill medication last filled by:  Date: 06/04/2023 Department: [facility] Ordering/Authorizing: Nelwyn Salisbury, MD

## 2023-06-08 ENCOUNTER — Other Ambulatory Visit: Payer: Self-pay | Admitting: Family Medicine

## 2023-06-19 ENCOUNTER — Other Ambulatory Visit (HOSPITAL_COMMUNITY): Payer: Self-pay

## 2023-06-23 ENCOUNTER — Other Ambulatory Visit: Payer: Self-pay | Admitting: Family Medicine

## 2023-06-28 ENCOUNTER — Other Ambulatory Visit (HOSPITAL_COMMUNITY): Payer: Self-pay

## 2023-06-28 NOTE — Telephone Encounter (Signed)
 Did a test bill for the Dexcom G7 sensors, findings were not covered under Medicare part D. PA not needed under part D. Must fill under part B. Called pharmacy and provided medicare B ID number, and they are also going to reach out to the patient. Nothing else needed from the PA team at this time.

## 2023-06-28 NOTE — Telephone Encounter (Signed)
 Noted.

## 2023-07-03 NOTE — Telephone Encounter (Signed)
 Copied from CRM [id number]. Topic: Clinical - Prescription Issue >> Jul 03, 2023  3:32 PM Corin V wrote: Reason for CRM: Patient's pharmacy notified him that 2 prescriptions were sent in incorrectly. His insulin  isophane & regular human KwikPen (NOVOLIN  70/30 KWIKPEN) (70-30) 100 UNIT/ML KwikPen was submitted with the dosage as 15 units twice per day and should be written as 30 units twice per day for 90 days. His Continuous Glucose Sensor (DEXCOM G7 SENSOR) MISC was submitted under Medicare Part D plan and needs to be submitted under Medicare Part B with anew code since it is medical equipment and it needs to be for a 90 day supply. When fixed, send to: [id number] - [place], [place] - [address]

## 2023-07-06 ENCOUNTER — Other Ambulatory Visit: Payer: Self-pay | Admitting: Family Medicine

## 2023-07-06 DIAGNOSIS — E039 Hypothyroidism, unspecified: Secondary | ICD-10-CM

## 2023-07-12 ENCOUNTER — Other Ambulatory Visit: Payer: Self-pay | Admitting: Physician Assistant

## 2023-07-12 NOTE — Telephone Encounter (Signed)
Dr. Clent Ridges, PCP, increased med dose.  Send back to pharmacy advising send to PCP

## 2023-07-13 ENCOUNTER — Telehealth: Payer: Self-pay | Admitting: Family Medicine

## 2023-07-13 DIAGNOSIS — E1165 Type 2 diabetes mellitus with hyperglycemia: Secondary | ICD-10-CM

## 2023-07-13 MED ORDER — FARXIGA 10 MG PO TABS: 10.0000 mg | ORAL_TABLET | Freq: Every day | ORAL | 3 refills | Status: DC

## 2023-07-13 NOTE — Telephone Encounter (Signed)
Pt states he has several issues with his medications:   Marcelline Deist - pt states medication needs to be written as Marcelline Deist instead of Dapagliflozin propanediol, otherwise his insurance will not cover it.  Novolin - pt states prescribtion was filled as a 90 day supply but the units was set for 15, it needs to be set for 30.   Dexcom - pt states prescribtion needs to be submitted as Part B for his Medicare and more description needs to included for insurance to cover it.   Pt has requested a call back at number: [phone].   Pharmacy:  [id number] - [place], [place] - [address]

## 2023-07-13 NOTE — Telephone Encounter (Signed)
I changed the Marcelline Deist to name brand and sent this in. I am not sure what he needs for the Novolin. We did write the dose for 30 units BID. Also what do we need to do about the Dexcom?

## 2023-07-19 ENCOUNTER — Other Ambulatory Visit: Payer: Self-pay | Admitting: Family Medicine

## 2023-07-19 MED ORDER — DEXCOM G7 SENSOR MISC: 5 refills | Status: DC

## 2023-07-19 NOTE — Telephone Encounter (Signed)
Hi Angela Pt names Kyri [phone] Please advise if this is something you can help pt out with. Dexcom - pt states prescription needs to be submitted as Part B for his Medicare and more description needs to included for insurance to cover it.

## 2023-07-19 NOTE — Telephone Encounter (Signed)
Pt notified, advised that I will check with the office pharmacist Marylene Land regarding the Dexcom

## 2023-07-19 NOTE — Telephone Encounter (Signed)
Contacted patient's pharmacy. They requested an updated rx with specific instructions and an ICD-10 dx code attached. Resent rx with those adjustments.

## 2023-07-23 ENCOUNTER — Other Ambulatory Visit: Payer: Self-pay

## 2023-07-23 MED ORDER — DEXCOM G7 SENSOR MISC: 5 refills | Status: AC

## 2023-07-23 NOTE — Telephone Encounter (Signed)
 noted

## 2023-08-02 ENCOUNTER — Other Ambulatory Visit: Payer: Self-pay | Admitting: Physician Assistant

## 2023-08-03 ENCOUNTER — Other Ambulatory Visit: Payer: Self-pay | Admitting: Physician Assistant

## 2023-08-08 ENCOUNTER — Other Ambulatory Visit: Payer: Self-pay | Admitting: Family Medicine

## 2023-08-09 ENCOUNTER — Telehealth: Payer: Self-pay | Admitting: Pharmacy Technician

## 2023-08-09 ENCOUNTER — Other Ambulatory Visit (HOSPITAL_COMMUNITY): Payer: Self-pay

## 2023-08-09 NOTE — Telephone Encounter (Signed)
PA request has been Cancelled. New Encounter created for follow up. For additional info see Pharmacy Prior Auth telephone encounter from 08/09/2023.

## 2023-08-09 NOTE — Telephone Encounter (Signed)
Pharmacy Patient Advocate Encounter   Received notification from RX Request Messages that prior authorization for Dexcom G7 Sensor is required/requested.   Insurance verification completed.   The patient is insured through [facility] .   Per test claim: No PA needed at this time. Plan Exclusion. Needs to be billed under Part B. This test claim was processed through [facility]- copay amounts may vary at other pharmacies due to pharmacy/plan contracts, or as the patient moves through the different stages of their insurance plan.

## 2023-08-13 ENCOUNTER — Telehealth: Payer: Self-pay

## 2023-08-13 ENCOUNTER — Telehealth (INDEPENDENT_AMBULATORY_CARE_PROVIDER_SITE_OTHER): Payer: Medicare Other | Admitting: Family Medicine

## 2023-08-13 ENCOUNTER — Encounter: Payer: Self-pay | Admitting: Family Medicine

## 2023-08-13 DIAGNOSIS — E1165 Type 2 diabetes mellitus with hyperglycemia: Secondary | ICD-10-CM

## 2023-08-13 DIAGNOSIS — I10 Essential (primary) hypertension: Secondary | ICD-10-CM | POA: Diagnosis not present

## 2023-08-13 DIAGNOSIS — Z794 Long term (current) use of insulin: Secondary | ICD-10-CM | POA: Diagnosis not present

## 2023-08-13 MED ORDER — NOVOLIN 70/30 FLEXPEN (70-30) 100 UNIT/ML ~~LOC~~ SUPN: PEN_INJECTOR | SUBCUTANEOUS | 3 refills | Status: DC

## 2023-08-13 MED ORDER — METOPROLOL SUCCINATE ER 25 MG PO TB24: 25.0000 mg | ORAL_TABLET | Freq: Every day | ORAL | 3 refills | Status: DC

## 2023-08-13 NOTE — Progress Notes (Signed)
 Subjective:    Patient ID: [name], male    DOB: 25-Apr-1956, 68 y.o.   MRN: [id number]  HPI Virtual Visit via Video Note  I connected with the patient on 08/13/23 at 10:45 AM EST by a video enabled telemedicine application and verified that I am speaking with the correct person using two identifiers.  Location patient: home Location provider:work or home office Persons participating in the virtual visit: patient, provider  I discussed the limitations of evaluation and management by telemedicine and the availability of in person appointments. The patient expressed understanding and agreed to proceed.   HPI: Here for refills and he has questions. He has been taking 30 units of Novolin 70/30 BID, and this has been working well. His BP has also been well controlled. He has had difficulty getting his Dexcom sensors refilled due to insurance issues.    ROS: See pertinent positives and negatives per HPI.  Past Medical History:  Diagnosis Date   CAD (coronary artery disease)    a. 05/2022 Cath: LM nl, LAD 2m/72m, 70d, D3 90, LCX 18m, 60d, OM1 sev diff dzs, OM2 85p, OM2 subbranch sev diff dzs, RCA 70p/m, 26m.   CHF (congestive heart failure) (HCC)    Chronic HFrEF (heart failure with reduced ejection fraction) (HCC)    a. 05/2022 Echo: EF 25-30%, mild LVH, GrII DD, mid-apical, anteroseptal, and apical HK. Nl RV fxn. RVSP 51.30mmHg. Mildly dil LA. Mod MR/TR. Mild AS/AI.   Diabetes mellitus    Hematospermia    Hypertension    Ischemic cardiomyopathy    a. 05/2022 Echo: EF 25-30%.   Moderate Mitral regurgitation    Moderate tricuspid regurgitation    Nephrolithiasis 02/07/2007   PAD (peripheral artery disease) (HCC)    a. 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   Viral meningitis     Past Surgical History:  Procedure Laterality Date   AMPUTATION Left 05/19/2022   Procedure: AMPUTATION BELOW KNEE;  Surgeon: Renford Dills, MD;  Location: [facility];  Service: Vascular;  Laterality: Left;    CHOLECYSTECTOMY  12-07-14   laparoscopic, per Dr. Rayburn Ma    LOWER EXTREMITY ANGIOGRAPHY Left 05/16/2022   Procedure: Lower Extremity Angiography;  Surgeon: Renford Dills, MD;  Location: [facility];  Service: Cardiovascular;  Laterality: Left;   RIGHT/LEFT HEART CATH AND CORONARY ANGIOGRAPHY N/A 06/02/2022   Procedure: RIGHT/LEFT HEART CATH AND CORONARY ANGIOGRAPHY;  Surgeon: Yvonne Kendall, MD;  Location: [facility];  Service: Cardiovascular;  Laterality: N/A;    Family History  Problem Relation Age of Onset   Rheum arthritis Mother    Diabetes Father    Heart disease Father    Cholecystitis Sister    Cholecystitis Brother    Diabetes Brother    Diabetes Brother    Parkinson's disease Brother    Heart disease Sister    Diabetes Sister    Cholecystitis Sister    Diabetes Sister    Arthritis Other    Hypertension Other    Stroke Other    Coronary artery disease Other    Birth defects Neg Hx      Current Outpatient Medications:    ACCU-CHEK GUIDE TEST test strip, USE TO TEST BLOOD SUGARS TWICE DAILY, Disp: 200 strip, Rfl: 3   Accu-Chek Softclix Lancets lancets, daily., Disp: , Rfl:    ALPRAZolam (XANAX) 0.5 MG tablet, TAKE 1 TABLET (0.5 MG TOTAL) BY MOUTH 3 (THREE) TIMES DAILY AS NEEDED FOR ANXIETY OR SLEEP., Disp: 270 tablet, Rfl:  1   aspirin EC 81 MG tablet, Take 1 tablet (81 mg total) by mouth daily. Swallow whole., Disp: 30 tablet, Rfl: 11   atorvastatin (LIPITOR) 20 MG tablet, TAKE 1 TABLET BY MOUTH EVERY DAY, Disp: 90 tablet, Rfl: 1   Blood Glucose Monitoring Suppl (ACCU-CHEK GUIDE ME) w/Device KIT, USE AS DIRECTED EVERY DAY, Disp: , Rfl:    clopidogrel (PLAVIX) 75 MG tablet, TAKE 1 TABLET BY MOUTH EVERY DAY, Disp: 90 tablet, Rfl: 1   Continuous Glucose Sensor (DEXCOM G7 SENSOR) MISC, Use to monitor blood glucose continuously. Replace with new sensor every 10 days. E11.65, Z79.4, Disp: 3 each, Rfl: 5   FARXIGA 10 MG TABS tablet, Take 1 tablet (10  mg total) by mouth daily., Disp: 90 tablet, Rfl: 3   insulin isophane & regular human KwikPen (NOVOLIN 70/30 KWIKPEN) (70-30) 100 UNIT/ML KwikPen, INJECT 30 UNITS INTO THE SKIN 2 (TWO) TIMES DAILY WITH A MEAL., Disp: , Rfl:    Insulin Pen Needle (B-D ULTRAFINE III SHORT PEN) 31G X 8 MM MISC, Inject 1 each into the skin 2 (two) times daily., Disp: 200 each, Rfl: 1   levothyroxine (SYNTHROID) 50 MCG tablet, TAKE 1 TABLET BY MOUTH EVERY DAY, Disp: 90 tablet, Rfl: 0   losartan (COZAAR) 25 MG tablet, Take 1 tablet (25 mg total) by mouth daily., Disp: 90 tablet, Rfl: 3   Melatonin 10 MG TABS, Take 1 tablet by mouth at bedtime., Disp: , Rfl:    metoprolol succinate (TOPROL-XL) 25 MG 24 hr tablet, Take 1 tablet (25 mg total) by mouth daily., Disp: , Rfl:    spironolactone (ALDACTONE) 25 MG tablet, TAKE 1 TABLET (25 MG TOTAL) BY MOUTH DAILY., Disp: 90 tablet, Rfl: 2   Torsemide 40 MG TABS, Take 40 mg by mouth as needed (AS NEEDED for weight gain of 3 pounds overnight or 5 pounds in a week)., Disp: 90 tablet, Rfl: 3  EXAM:  VITALS per patient if applicable:  GENERAL: alert, oriented, appears well and in no acute distress  HEENT: atraumatic, conjunttiva clear, no obvious abnormalities on inspection of external nose and ears  NECK: normal movements of the head and neck  LUNGS: on inspection no signs of respiratory distress, breathing rate appears normal, no obvious gross SOB, gasping or wheezing  CV: no obvious cyanosis  MS: moves all visible extremities without noticeable abnormality  PSYCH/NEURO: pleasant and cooperative, no obvious depression or anxiety, speech and thought processing grossly intact  ASSESSMENT AND PLAN: His type 2 diabetes is stable, and we refilled the Novolin 70/30. We will speak to our pharmacist about getting the Dexcom sensors refilled. His HTN is stable, and we refilled the Metoprolol. [name], MD  Discussed the following assessment and plan:  No diagnosis  found.     I discussed the assessment and treatment plan with the patient. The patient was provided an opportunity to ask questions and all were answered. The patient agreed with the plan and demonstrated an understanding of the instructions.   The patient was advised to call back or seek an in-person evaluation if the symptoms worsen or if the condition fails to improve as anticipated.      Review of Systems     Objective:   Physical Exam        Assessment & Plan:

## 2023-08-13 NOTE — Progress Notes (Unsigned)
   08/13/2023  Patient ID: [name], male   DOB: 1955/06/30, 68 y.o.   MRN: [id number]  Contacted [facility] to clarify issue as I had spoken with them last month and they said prescription was all set. Now they are stating that Med Part B is requiring a prior authorization and that they cannot generate the required form.  Spoke with patient and he elects to try and fill rx with a DME supplier. Submitted order to [facility] through [facility] portal. Will keep patient updated.  Sherrill Raring, PharmD Clinical Pharmacist [phone]

## 2023-08-14 NOTE — Telephone Encounter (Signed)
 Pt prescription is being handled by Marylene Land the office pharmacist

## 2023-08-17 NOTE — Telephone Encounter (Signed)
 Patient order accepted and patient has received shipment. Patient will reach back out if any concerns.  Sherrill Raring, PharmD Clinical Pharmacist [phone]

## 2023-10-05 ENCOUNTER — Encounter (INDEPENDENT_AMBULATORY_CARE_PROVIDER_SITE_OTHER): Payer: No Typology Code available for payment source

## 2023-10-05 ENCOUNTER — Ambulatory Visit (INDEPENDENT_AMBULATORY_CARE_PROVIDER_SITE_OTHER): Payer: No Typology Code available for payment source | Admitting: Vascular Surgery

## 2023-10-10 ENCOUNTER — Other Ambulatory Visit (INDEPENDENT_AMBULATORY_CARE_PROVIDER_SITE_OTHER): Payer: Self-pay | Admitting: Nurse Practitioner

## 2023-10-10 DIAGNOSIS — Z9889 Other specified postprocedural states: Secondary | ICD-10-CM

## 2023-10-10 DIAGNOSIS — Z89512 Acquired absence of left leg below knee: Secondary | ICD-10-CM

## 2023-10-11 ENCOUNTER — Encounter (INDEPENDENT_AMBULATORY_CARE_PROVIDER_SITE_OTHER): Payer: Self-pay | Admitting: Vascular Surgery

## 2023-10-11 ENCOUNTER — Ambulatory Visit (INDEPENDENT_AMBULATORY_CARE_PROVIDER_SITE_OTHER): Payer: No Typology Code available for payment source | Admitting: Vascular Surgery

## 2023-10-11 ENCOUNTER — Ambulatory Visit (INDEPENDENT_AMBULATORY_CARE_PROVIDER_SITE_OTHER): Payer: No Typology Code available for payment source

## 2023-10-11 VITALS — BP 176/81 | HR 62 | Resp 18 | Ht 68.0 in | Wt 231.0 lb

## 2023-10-11 DIAGNOSIS — I70211 Atherosclerosis of native arteries of extremities with intermittent claudication, right leg: Secondary | ICD-10-CM | POA: Diagnosis not present

## 2023-10-11 DIAGNOSIS — I739 Peripheral vascular disease, unspecified: Secondary | ICD-10-CM

## 2023-10-11 DIAGNOSIS — Z89512 Acquired absence of left leg below knee: Secondary | ICD-10-CM

## 2023-10-11 DIAGNOSIS — E1169 Type 2 diabetes mellitus with other specified complication: Secondary | ICD-10-CM | POA: Diagnosis not present

## 2023-10-11 DIAGNOSIS — Z9889 Other specified postprocedural states: Secondary | ICD-10-CM

## 2023-10-11 DIAGNOSIS — E118 Type 2 diabetes mellitus with unspecified complications: Secondary | ICD-10-CM

## 2023-10-11 DIAGNOSIS — I10 Essential (primary) hypertension: Secondary | ICD-10-CM

## 2023-10-11 DIAGNOSIS — E785 Hyperlipidemia, unspecified: Secondary | ICD-10-CM

## 2023-10-11 NOTE — Progress Notes (Signed)
 MRN : [id number]  [name] is a 68 y.o. (08-22-1955) male who presents with chief complaint of check circulation.  History of Present Illness:   The patient returns to the office for followup and review of the noninvasive studies.    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   There have been no interval changes in right lower extremity symptoms. No interval shortening of the patient's claudication distance or development of rest pain symptoms. No new ulcers or wounds have occurred since the last visit.   There have been no significant changes to the patient's overall health care.   The patient denies amaurosis fugax or recent TIA symptoms. There are no documented recent neurological changes noted. There is no history of DVT, PE or superficial thrombophlebitis. The patient denies recent episodes of angina or shortness of breath.    ABI Rt=[place] (TBI=0.60) and Lt=BKA  (previous ABI's Rt=1.16 and Lt=BKA)  No outpatient medications have been marked as taking for the 10/11/23 encounter (Appointment) with Prescilla Brod, Ninette Basque, MD.    Past Medical History:  Diagnosis Date   CAD (coronary artery disease)    a. 05/2022 Cath: LM nl, LAD 39m/19m, 70d, D3 90, LCX 85m, 60d, OM1 sev diff dzs, OM2 85p, OM2 subbranch sev diff dzs, RCA 70p/m, 51m.   CHF (congestive heart failure) (HCC)    Chronic HFrEF (heart failure with reduced ejection fraction) (HCC)    a. 05/2022 Echo: EF 25-30%, mild LVH, GrII DD, mid-apical, anteroseptal, and apical HK. Nl RV fxn. RVSP 51.77mmHg. Mildly dil LA. Mod MR/TR. Mild AS/AI.   Diabetes mellitus    Hematospermia    Hypertension    Ischemic cardiomyopathy    a. 05/2022 Echo: EF 25-30%.   Moderate Mitral regurgitation    Moderate tricuspid regurgitation    Nephrolithiasis 02/07/2007   PAD (peripheral artery disease) (HCC)    a. 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   Viral meningitis     Past  Surgical History:  Procedure Laterality Date   AMPUTATION Left 05/19/2022   Procedure: AMPUTATION BELOW KNEE;  Surgeon: Jackquelyn Mass, MD;  Location: [facility];  Service: Vascular;  Laterality: Left;   CHOLECYSTECTOMY  12-07-14   laparoscopic, per Dr. Asenath Blacker    LOWER EXTREMITY ANGIOGRAPHY Left 05/16/2022   Procedure: Lower Extremity Angiography;  Surgeon: Jackquelyn Mass, MD;  Location: [facility];  Service: Cardiovascular;  Laterality: Left;   RIGHT/LEFT HEART CATH AND CORONARY ANGIOGRAPHY N/A 06/02/2022   Procedure: RIGHT/LEFT HEART CATH AND CORONARY ANGIOGRAPHY;  Surgeon: Sammy Crisp, MD;  Location: [facility];  Service: Cardiovascular;  Laterality: N/A;    Social History Social History   Tobacco Use   Smoking status: Former   Smokeless tobacco: Never   Tobacco comments:    22 yrs ago   Substance Use Topics   Alcohol use: No    Alcohol/week: 0.0 standard drinks of alcohol   Drug use: No    Family History Family History  Problem Relation Age of Onset   Rheum arthritis Mother    Diabetes Father    Heart  disease Father    Cholecystitis Sister    Cholecystitis Brother    Diabetes Brother    Diabetes Brother    Parkinson's disease Brother    Heart disease Sister    Diabetes Sister    Cholecystitis Sister    Diabetes Sister    Arthritis Other    Hypertension Other    Stroke Other    Coronary artery disease Other    Birth defects Neg Hx     No Known Allergies   REVIEW OF SYSTEMS (Negative unless checked)  Constitutional: [] Weight loss  [] Fever  [] Chills Cardiac: [] Chest pain   [] Chest pressure   [] Palpitations   [] Shortness of breath when laying flat   [] Shortness of breath with exertion. Vascular:  [x] Pain in legs with walking   [] Pain in legs at rest  [] History of DVT   [] Phlebitis   [] Swelling in legs   [] Varicose veins   [] Non-healing ulcers Pulmonary:   [] Uses home oxygen   [] Productive cough   [] Hemoptysis   [] Wheeze  [] COPD    [] Asthma Neurologic:  [] Dizziness   [] Seizures   [] History of stroke   [] History of TIA  [] Aphasia   [] Vissual changes   [] Weakness or numbness in arm   [] Weakness or numbness in leg Musculoskeletal:   [] Joint swelling   [] Joint pain   [] Low back pain Hematologic:  [] Easy bruising  [] Easy bleeding   [] Hypercoagulable state   [] Anemic Gastrointestinal:  [] Diarrhea   [] Vomiting  [] Gastroesophageal reflux/heartburn   [] Difficulty swallowing. Genitourinary:  [] Chronic kidney disease   [] Difficult urination  [] Frequent urination   [] Blood in urine Skin:  [] Rashes   [] Ulcers  Psychological:  [] History of anxiety   []  History of major depression.  Physical Examination  There were no vitals filed for this visit. There is no height or weight on file to calculate BMI. Gen: WD/WN, NAD Head: [place]/AT, No temporalis wasting.  Ear/Nose/Throat: Hearing grossly intact, nares w/o erythema or drainage Eyes: PER, EOMI, sclera nonicteric.  Neck: Supple, no masses.  No bruit or JVD.  Pulmonary:  Good air movement, no audible wheezing, no use of accessory muscles.  Cardiac: RRR, normal S1, S2, no Murmurs. Vascular:  mild trophic changes, no open wounds Vessel Right Left  Radial Palpable Palpable  PT Not Palpable BKA  DP Not Palpable BKA  Gastrointestinal: soft, non-distended. No guarding/no peritoneal signs.  Musculoskeletal: M/S 5/5 throughout.  No visible deformity.  Neurologic: CN 2-12 intact. Pain and light touch intact in extremities.  Symmetrical.  Speech is fluent. Motor exam as listed above. Psychiatric: Judgment intact, Mood & affect appropriate for pt's clinical situation. Dermatologic: No rashes or ulcers noted.  No changes consistent with cellulitis.   CBC Lab Results  Component Value Date   WBC 8.6 05/31/2023   HGB 15.1 05/31/2023   HCT 44.1 05/31/2023   MCV 91.3 05/31/2023   PLT 183 05/31/2023    BMET    Component Value Date/Time   NA 134 (L) 05/31/2023 1407   K 4.3 05/31/2023 1407    CL 102 05/31/2023 1407   CO2 23 05/31/2023 1407   GLUCOSE 294 (H) 05/31/2023 1407   BUN 29 (H) 05/31/2023 1407   CREATININE 1.55 (H) 05/31/2023 1407   CREATININE 1.31 (H) 03/15/2020 1700   CALCIUM  8.6 (L) 05/31/2023 1407   GFRNONAA 49 (L) 05/31/2023 1407   GFRAA >60 01/10/2020 1107   CrCl cannot be calculated (Patient's most recent lab result is older than the maximum 21 days allowed.).  COAG  Lab Results  Component Value Date   INR 1.1 05/31/2023   INR 1.1 05/27/2022    Radiology No results found.   Assessment/Plan 1. Atherosclerosis of native artery of right lower extremity with intermittent claudication (HCC) (Primary)  Recommend:  The patient has evidence of atherosclerosis of the lower extremities with claudication.  The patient does not voice lifestyle limiting changes at this point in time.  Noninvasive studies do not suggest clinically significant change.  No invasive studies, angiography or surgery at this time The patient should continue walking and begin a more formal exercise program.  The patient should continue antiplatelet therapy and aggressive treatment of the lipid abnormalities  No changes in the patient's medications at this time  Continued surveillance is indicated as atherosclerosis is likely to progress with time.    The patient will continue follow up with noninvasive studies as ordered.  - VAS US  ABI WITH/WO TBI; Future  2. Hx of BKA, left (HCC) Patient appears to have issues with the cup he is wearing 5 layers.  I will ask Wava Hagedorn to check on this for him.  3. Diabetes mellitus type 2 with complications (HCC) Continue hypoglycemic medications as already ordered, these medications have been reviewed and there are no changes at this time.  Hgb A1C to be monitored as already arranged by primary service  4. Hyperlipidemia associated with type 2 diabetes mellitus (HCC) Continue statin as ordered and reviewed, no changes at this time  5.  Essential hypertension Continue antihypertensive medications as already ordered, these medications have been reviewed and there are no changes at this time.    Devon Fogo, MD  10/11/2023 12:46 PM

## 2023-10-13 ENCOUNTER — Encounter (INDEPENDENT_AMBULATORY_CARE_PROVIDER_SITE_OTHER): Payer: Self-pay | Admitting: Vascular Surgery

## 2023-10-14 ENCOUNTER — Other Ambulatory Visit: Payer: Self-pay | Admitting: Family Medicine

## 2023-10-14 DIAGNOSIS — E1140 Type 2 diabetes mellitus with diabetic neuropathy, unspecified: Secondary | ICD-10-CM

## 2023-10-15 LAB — VAS US ABI WITH/WO TBI

## 2023-10-23 ENCOUNTER — Encounter: Payer: Self-pay | Admitting: Family Medicine

## 2023-10-24 ENCOUNTER — Other Ambulatory Visit: Payer: Self-pay | Admitting: Family Medicine

## 2023-10-24 ENCOUNTER — Other Ambulatory Visit: Payer: Self-pay

## 2023-10-24 DIAGNOSIS — E1140 Type 2 diabetes mellitus with diabetic neuropathy, unspecified: Secondary | ICD-10-CM

## 2023-10-24 MED ORDER — BD PEN NEEDLE SHORT U/F 31G X 8 MM MISC: 1.0000 | Freq: Two times a day (BID) | 0 refills | Status: DC

## 2023-10-24 MED ORDER — INSULIN PEN NEEDLE 32G X 6 MM MISC: 1.0000 | Freq: Two times a day (BID) | 5 refills | Status: AC

## 2023-10-24 NOTE — Telephone Encounter (Signed)
 Done

## 2023-11-06 ENCOUNTER — Encounter (INDEPENDENT_AMBULATORY_CARE_PROVIDER_SITE_OTHER): Payer: Self-pay

## 2023-11-12 ENCOUNTER — Other Ambulatory Visit: Payer: Self-pay | Admitting: Family

## 2023-11-14 NOTE — Telephone Encounter (Signed)
 [facility] pt. Pt is not following up with heart failure clinic any longer.

## 2023-11-15 NOTE — Telephone Encounter (Signed)
 Given that it has now been >12 months since his NSTEMI that was medically managed, clopidogrel  can be stopped and aspirin  81 mg daily continued indefinitely.  Sammy Crisp, MD [facility]

## 2023-11-15 NOTE — Telephone Encounter (Signed)
 Left a message for the patient to call back.

## 2023-11-15 NOTE — Telephone Encounter (Signed)
Pt made aware and verbalized understanding Medication list updated

## 2023-11-15 NOTE — Addendum Note (Signed)
 Addended by: Chapman Commodore on: 11/15/2023 04:24 PM   Modules accepted: Orders

## 2023-11-20 IMAGING — MR MR [PERSON_NAME] LOW WO/W CM*L*
8 of 9 series · 33 of 40 positions shown · IV contrast (gadavist)
Comparison: None Available.

CLINICAL DATA: No known injury, pressure wound on the heel

EXAM:
MRI OF LOWER LEFT EXTREMITY WITHOUT AND WITH CONTRAST
TECHNIQUE: Multiplanar, multisequence MR imaging of the left ankle was
performed both before and after administration of intravenous
contrast.
CONTRAST:  9mL GADAVIST GADOBUTROL 1 MMOL/ML IV SOLN

[Series 4: T1 · coronal · left · 3.0mm · 0.47mm/px · 2 of 16 slices shown (1 of 2)]
[im 1/16]
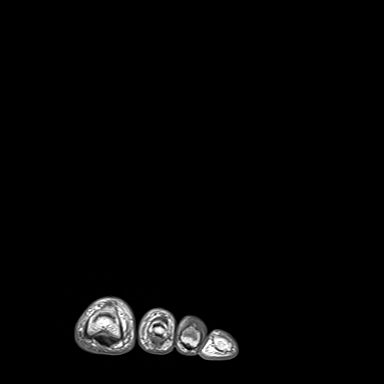
[im 16/16]
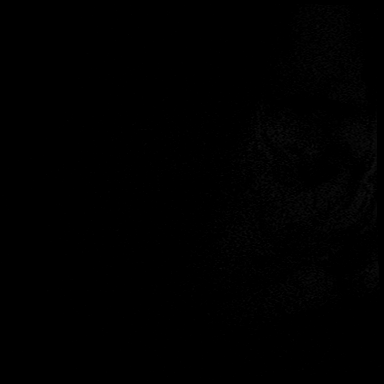

[Series 5: PD fat-sat · axial · left · 3.0mm · 0.50mm/px · z∈[-112,+28]mm · 5 of 36 slices shown]
[im 1/36]
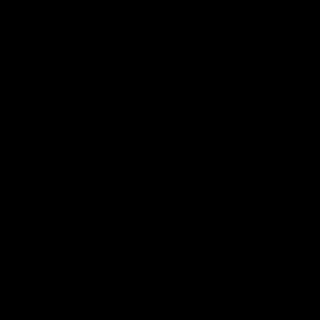
[im 9/36]
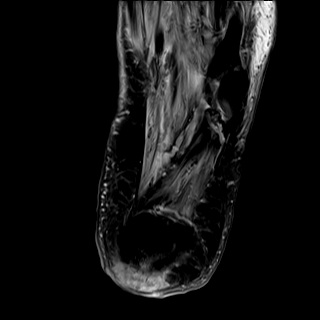
[im 18/36]
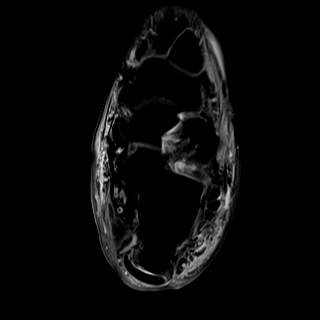
[im 27/36]
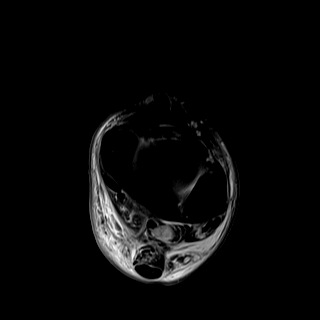
[im 36/36]
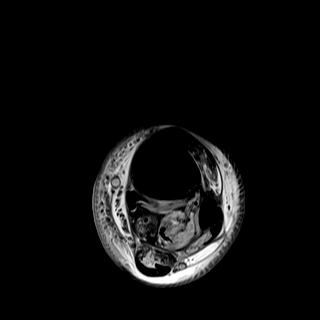

[Series 6: T2 fat-sat · axial · left · 3.0mm · 0.50mm/px · z∈[-112,+28]mm · 5 of 36 slices shown]
[im 1/36]
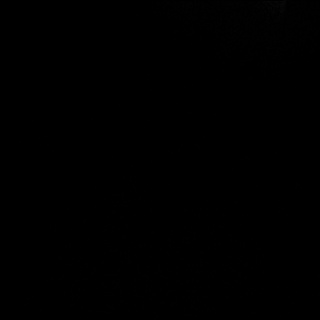
[im 9/36]
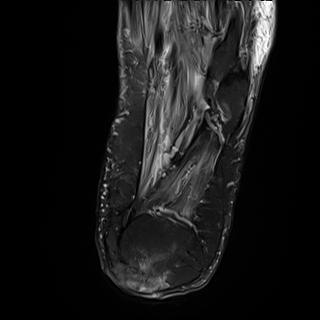
[im 18/36]
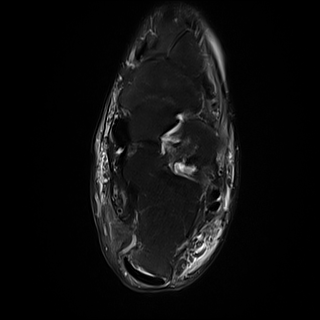
[im 27/36]
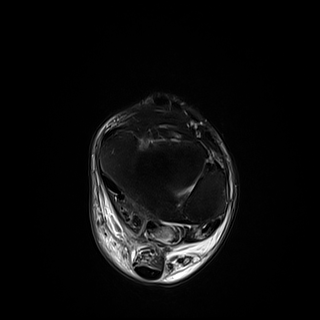
[im 36/36]
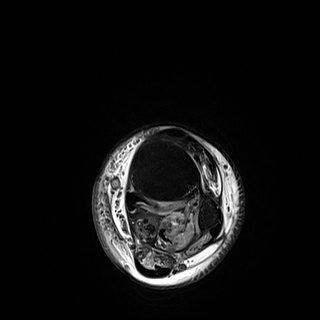

[Series 8: T2 · coronal · left · 3.0mm · 0.62mm/px · 2 of 40 slices shown]
[im 1/40]
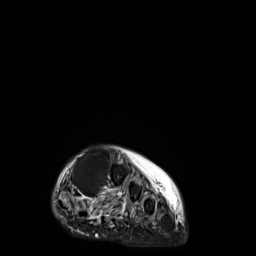
[im 8/40]
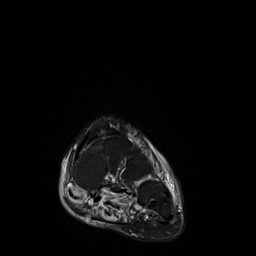

[Series 9: T1 · sagittal · left · 4.0mm · 0.70mm/px · 3 of 23 slices shown (2 of 2)]
[im 1/23]
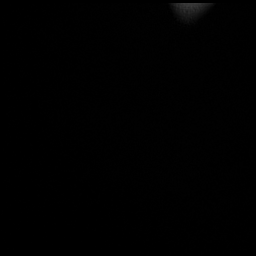
[im 12/23]
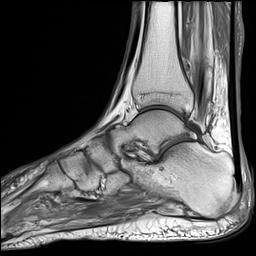
[im 23/23]
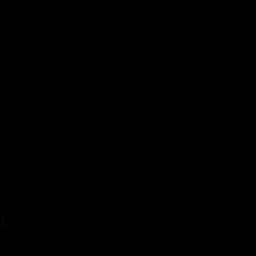

[Series 11: T1 fat-sat · axial · non-contrast · left · 3.0mm · 0.31mm/px · z∈[-112,+28]mm · 5 of 36 slices shown]
[im 1/36]
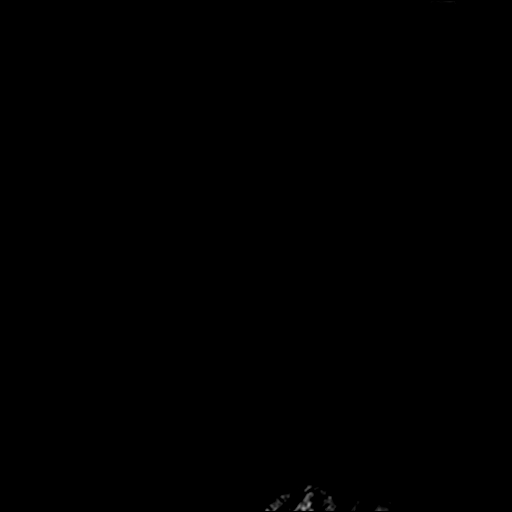
[im 9/36]
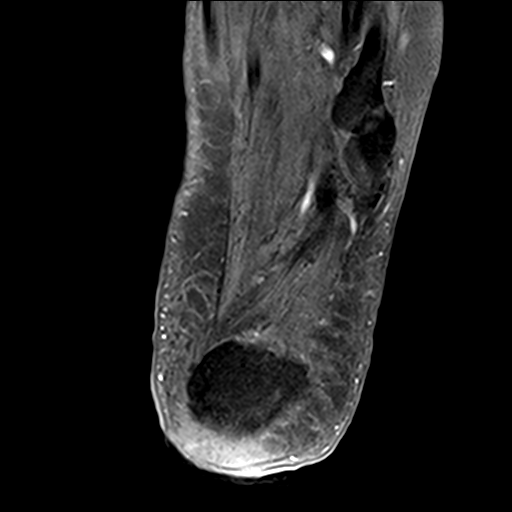
[im 18/36]
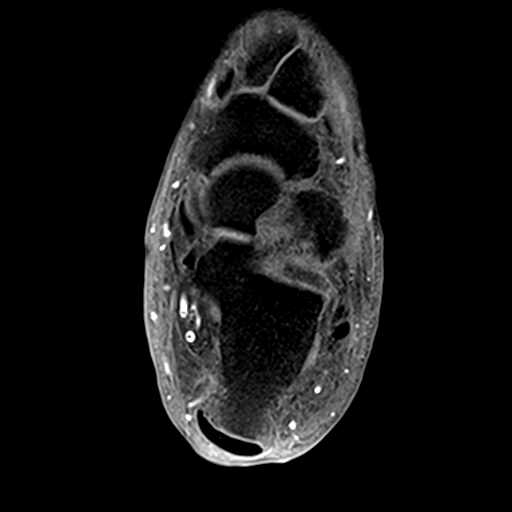
[im 27/36]
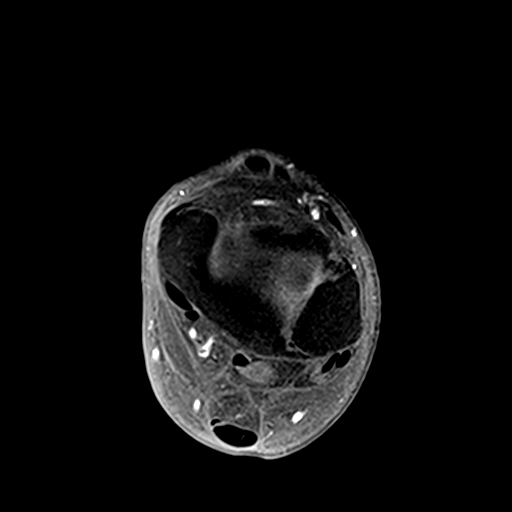
[im 36/36]
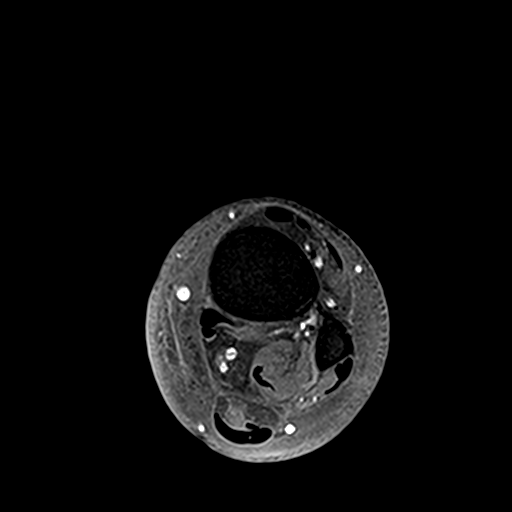

[Series 12: T1 fat-sat post-contrast · axial · left · 3.0mm · 0.31mm/px · z∈[-112,+28]mm · 5 of 36 slices shown (1 of 2)]
[im 1/36]
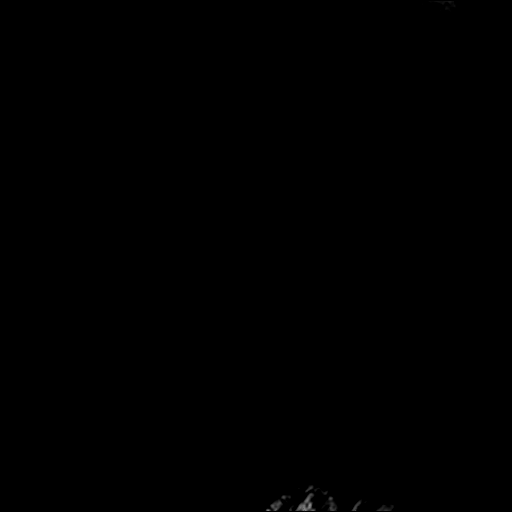
[im 9/36]
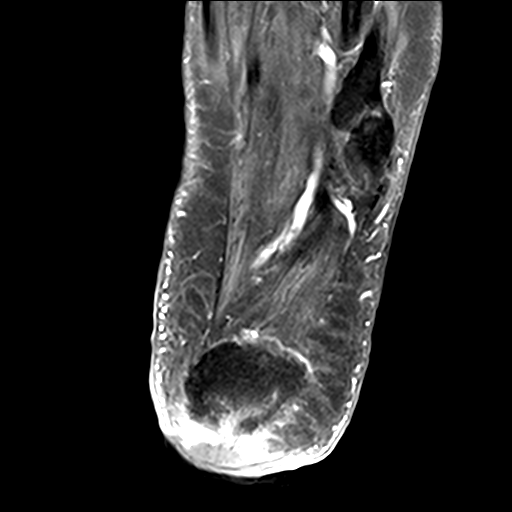
[im 18/36]
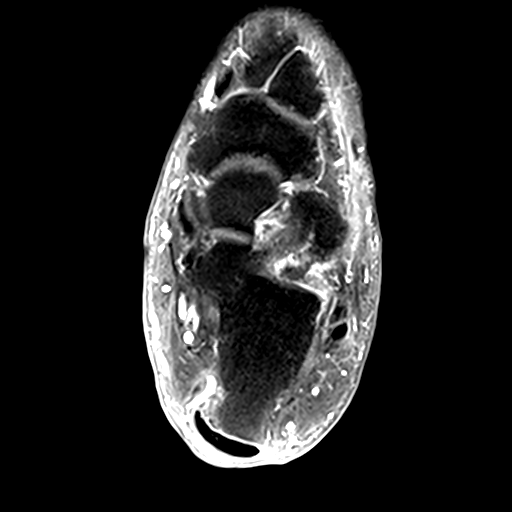
[im 27/36]
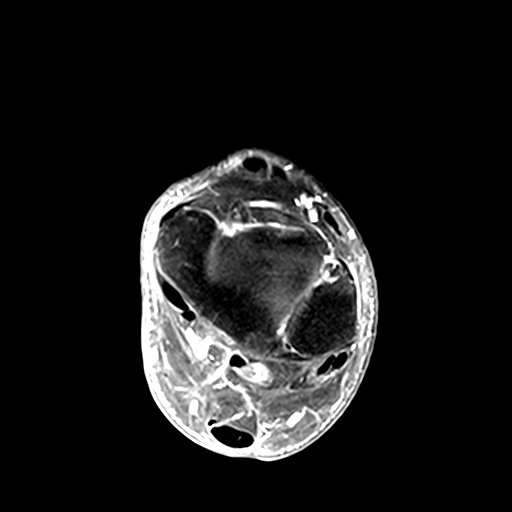
[im 36/36]
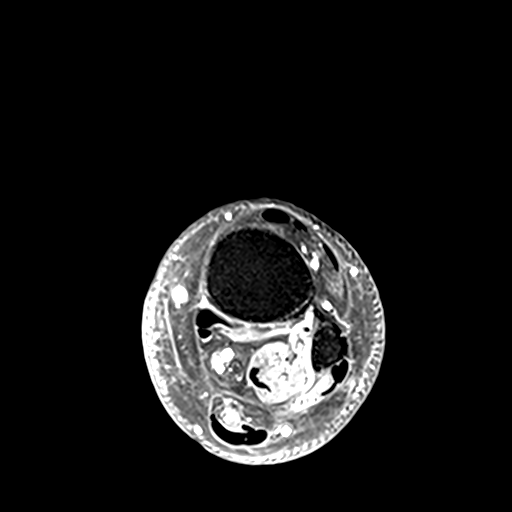

[Series 13: T1 fat-sat post-contrast · coronal · left · 3.0mm · 0.62mm/px · 6 of 40 slices shown (2 of 2)]
[im 1/40]
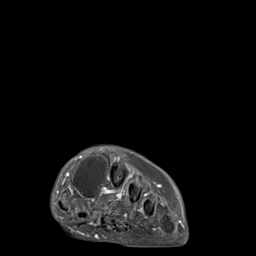
[im 8/40]
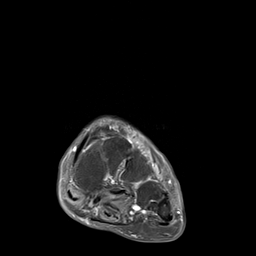
[im 16/40]
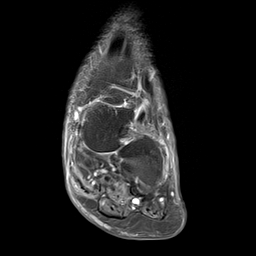
[im 24/40]
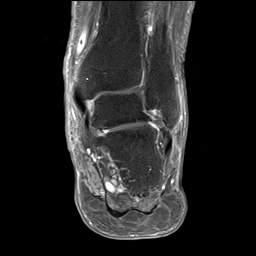
[im 32/40]
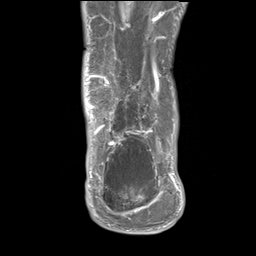
[im 40/40]
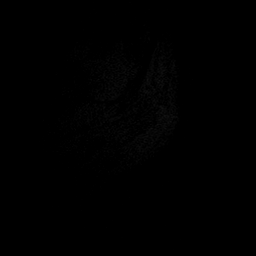

[33 of 40 positions shown; findings below may reference images not displayed]

FINDINGS: TENDONS

Peroneal: Peroneal longus tendon intact. Mild tendinosis of the
peroneus brevis.

Posteromedial: Posterior tibial tendon intact. Flexor hallucis
longus tendon intact. Flexor digitorum longus tendon intact.

Anterior: Tibialis anterior tendon intact. Extensor hallucis longus
tendon intact Extensor digitorum longus tendon intact.

Achilles:  Intact.

Plantar Fascia: Intact.

LIGAMENTS

Lateral: Anterior talofibular ligament intact. Calcaneofibular
ligament intact. Posterior talofibular ligament intact. Anterior and
posterior tibiofibular ligaments intact.

Medial: Deltoid ligament intact. Spring ligament intact.

CARTILAGE

Ankle Joint: No joint effusion. Normal ankle mortise. No chondral
defect.

Subtalar Joints/Sinus Tarsi: Normal subtalar joints. No subtalar
joint effusion. Normal sinus tarsi.

Soft Tissue and Bones: Soft tissue wound along the posterior aspect
of the calcaneus with surrounding soft tissue edema as can be seen
with cellulitis. Mild subcortical bone marrow edema concerning for
osteomyelitis disc distal to the Achilles tendon insertion.

No other marrow signal abnormality. No fracture or dislocation. No
aggressive osseous lesion.

Soft tissue edema around the ankle. No fluid collection or hematoma.
T2 hyperintense signal throughout the plantar musculature likely
neurogenic. Tarsal tunnel is normal.
IMPRESSION: 1. Soft tissue wound along the posterior aspect of the calcaneus
with surrounding soft tissue edema as can be seen with cellulitis.
Mild subcortical bone marrow edema concerning for osteomyelitis disc
distal to the Achilles tendon insertion.
2. No drainable fluid collection.
3. Mild tendinosis of the peroneus brevis.

## 2023-12-23 ENCOUNTER — Other Ambulatory Visit: Payer: Self-pay | Admitting: Family Medicine

## 2023-12-23 DIAGNOSIS — E039 Hypothyroidism, unspecified: Secondary | ICD-10-CM

## 2023-12-27 ENCOUNTER — Ambulatory Visit: Admitting: Family Medicine

## 2023-12-27 ENCOUNTER — Encounter: Payer: Self-pay | Admitting: Family Medicine

## 2023-12-27 VITALS — BP 150/60 | HR 69 | Temp 98.3°F | Wt 235.4 lb

## 2023-12-27 DIAGNOSIS — N138 Other obstructive and reflux uropathy: Secondary | ICD-10-CM

## 2023-12-27 DIAGNOSIS — E118 Type 2 diabetes mellitus with unspecified complications: Secondary | ICD-10-CM | POA: Diagnosis not present

## 2023-12-27 DIAGNOSIS — N401 Enlarged prostate with lower urinary tract symptoms: Secondary | ICD-10-CM

## 2023-12-27 DIAGNOSIS — E039 Hypothyroidism, unspecified: Secondary | ICD-10-CM

## 2023-12-27 DIAGNOSIS — I2510 Atherosclerotic heart disease of native coronary artery without angina pectoris: Secondary | ICD-10-CM | POA: Diagnosis not present

## 2023-12-27 DIAGNOSIS — E1165 Type 2 diabetes mellitus with hyperglycemia: Secondary | ICD-10-CM | POA: Diagnosis not present

## 2023-12-27 DIAGNOSIS — E785 Hyperlipidemia, unspecified: Secondary | ICD-10-CM

## 2023-12-27 DIAGNOSIS — I5023 Acute on chronic systolic (congestive) heart failure: Secondary | ICD-10-CM

## 2023-12-27 DIAGNOSIS — E1169 Type 2 diabetes mellitus with other specified complication: Secondary | ICD-10-CM

## 2023-12-27 DIAGNOSIS — Z794 Long term (current) use of insulin: Secondary | ICD-10-CM

## 2023-12-27 LAB — CBC WITH DIFFERENTIAL/PLATELET
Basophils Absolute: 0.1 K/uL (ref 0.0–0.1)
Basophils Relative: 1.1 % (ref 0.0–3.0)
Eosinophils Absolute: 0.4 K/uL (ref 0.0–0.7)
Eosinophils Relative: 4.8 % (ref 0.0–5.0)
HCT: 41.3 % (ref 39.0–52.0)
Hemoglobin: 14.1 g/dL (ref 13.0–17.0)
Lymphocytes Relative: 15.8 % (ref 12.0–46.0)
Lymphs Abs: 1.2 K/uL (ref 0.7–4.0)
MCHC: 34.3 g/dL (ref 30.0–36.0)
MCV: 93.8 fl (ref 78.0–100.0)
Monocytes Absolute: 0.4 K/uL (ref 0.1–1.0)
Monocytes Relative: 5.3 % (ref 3.0–12.0)
Neutro Abs: 5.6 K/uL (ref 1.4–7.7)
Neutrophils Relative %: 73 % (ref 43.0–77.0)
Platelets: 150 K/uL (ref 150.0–400.0)
RBC: 4.4 Mil/uL (ref 4.22–5.81)
RDW: 13.8 % (ref 11.5–15.5)
WBC: 7.6 K/uL (ref 4.0–10.5)

## 2023-12-27 LAB — BASIC METABOLIC PANEL WITH GFR
BUN: 37 mg/dL — ABNORMAL HIGH (ref 6–23)
CO2: 25 meq/L (ref 19–32)
Calcium: 9.2 mg/dL (ref 8.4–10.5)
Chloride: 105 meq/L (ref 96–112)
Creatinine, Ser: 1.52 mg/dL — ABNORMAL HIGH (ref 0.40–1.50)
GFR: 47.62 mL/min — ABNORMAL LOW (ref >60.00)
Glucose, Bld: 142 mg/dL — ABNORMAL HIGH (ref 70–99)
Potassium: 4.6 meq/L (ref 3.5–5.1)
Sodium: 138 meq/L (ref 135–145)

## 2023-12-27 LAB — LIPID PANEL
Cholesterol: 85 mg/dL (ref 0–200)
HDL: 42.9 mg/dL (ref >39.00)
LDL Cholesterol: 28 mg/dL (ref 0–99)
NonHDL: 42.03
Total CHOL/HDL Ratio: 2
Triglycerides: 69 mg/dL (ref 0.0–149.0)
VLDL: 13.8 mg/dL (ref 0.0–40.0)

## 2023-12-27 LAB — PSA: PSA: 0.42 ng/mL (ref 0.10–4.00)

## 2023-12-27 LAB — T4, FREE: Free T4: 0.92 ng/dL (ref 0.60–1.60)

## 2023-12-27 LAB — HEPATIC FUNCTION PANEL
ALT: 39 U/L (ref 0–53)
AST: 25 U/L (ref 0–37)
Albumin: 4.3 g/dL (ref 3.5–5.2)
Alkaline Phosphatase: 105 U/L (ref 39–117)
Bilirubin, Direct: 0.3 mg/dL (ref 0.0–0.3)
Total Bilirubin: 1.1 mg/dL (ref 0.2–1.2)
Total Protein: 7.7 g/dL (ref 6.0–8.3)

## 2023-12-27 LAB — TSH: TSH: 4 u[IU]/mL (ref 0.35–5.50)

## 2023-12-27 LAB — HEMOGLOBIN A1C: Hgb A1c MFr Bld: 8.1 % — ABNORMAL HIGH (ref 4.6–6.5)

## 2023-12-27 LAB — T3, FREE: T3, Free: 2.7 pg/mL (ref 2.3–4.2)

## 2023-12-27 MED ORDER — ATORVASTATIN CALCIUM 20 MG PO TABS: 20.0000 mg | ORAL_TABLET | Freq: Every day | ORAL | 3 refills | Status: DC

## 2023-12-27 MED ORDER — ALPRAZOLAM 0.5 MG PO TABS: 0.5000 mg | ORAL_TABLET | Freq: Three times a day (TID) | ORAL | 1 refills | Status: AC | PRN

## 2023-12-27 MED ORDER — LOSARTAN POTASSIUM 25 MG PO TABS: 25.0000 mg | ORAL_TABLET | Freq: Every day | ORAL | 3 refills | Status: AC

## 2023-12-27 NOTE — Progress Notes (Signed)
 Subjective:    Patient ID: Alm LELON Law, male    DOB: Nov 26, 1955, 68 y.o.   MRN: [id number]  HPI Here to follow up on issues. He feels well. His BP at home averages in the 110's over 70's. His am fasting glucoses average 150. He sees his retina specialist regularly for laser treatments.    Review of Systems  Constitutional: Negative.   HENT: Negative.    Eyes: Negative.   Respiratory: Negative.    Cardiovascular: Negative.   Gastrointestinal: Negative.   Genitourinary: Negative.   Musculoskeletal: Negative.   Skin: Negative.   Neurological: Negative.   Psychiatric/Behavioral: Negative.         Objective:   Physical Exam Constitutional:      General: He is not in acute distress.    Appearance: Normal appearance. He is well-developed. He is not diaphoretic.     Comments: Walks with a cane   HENT:     Head: Normocephalic and atraumatic.     Right Ear: External ear normal.     Left Ear: External ear normal.     Nose: Nose normal.     Mouth/Throat:     Pharynx: No oropharyngeal exudate.  Eyes:     General: No scleral icterus.       Right eye: No discharge.        Left eye: No discharge.     Conjunctiva/sclera: Conjunctivae normal.     Pupils: Pupils are equal, round, and reactive to light.  Neck:     Thyroid : No thyromegaly.     Vascular: No JVD.     Trachea: No tracheal deviation.  Cardiovascular:     Rate and Rhythm: Normal rate and regular rhythm.     Pulses: Normal pulses.     Heart sounds: Normal heart sounds. No murmur heard.    No friction rub. No gallop.  Pulmonary:     Effort: Pulmonary effort is normal. No respiratory distress.     Breath sounds: Normal breath sounds. No wheezing or rales.  Chest:     Chest wall: No tenderness.  Abdominal:     General: Bowel sounds are normal. There is no distension.     Palpations: Abdomen is soft. There is no mass.     Tenderness: There is no abdominal tenderness. There is no guarding or rebound.  Genitourinary:     Penis: No tenderness.   Musculoskeletal:        General: No tenderness. Normal range of motion.     Cervical back: Neck supple.  Lymphadenopathy:     Cervical: No cervical adenopathy.  Skin:    General: Skin is warm and dry.     Coloration: Skin is not pale.     Findings: No erythema or rash.  Neurological:     General: No focal deficit present.     Mental Status: He is alert and oriented to person, place, and time.     Cranial Nerves: No cranial nerve deficit.     Motor: No abnormal muscle tone.     Coordination: Coordination normal.     Deep Tendon Reflexes: Reflexes are normal and symmetric. Reflexes normal.  Psychiatric:        Mood and Affect: Mood normal.        Behavior: Behavior normal.        Thought Content: Thought content normal.        Judgment: Judgment normal.           Assessment &  Plan:  His HTN and CAD and CHF seem to be stable. He will follow up with Cardiology. His diabetes seems to be marginally controlled. We will get labs today to check an A1c, lipids, etc. Once again he declines a colonoscopy. We spent a total of ( 34  ) minutes reviewing records and discussing these issues.  Garnette Olmsted, MD

## 2023-12-28 ENCOUNTER — Ambulatory Visit: Payer: Self-pay | Admitting: Family Medicine

## 2023-12-28 ENCOUNTER — Other Ambulatory Visit: Payer: Self-pay

## 2023-12-28 MED ORDER — NOVOLIN 70/30 FLEXPEN (70-30) 100 UNIT/ML ~~LOC~~ SUPN: PEN_INJECTOR | SUBCUTANEOUS | 3 refills | Status: AC

## 2024-02-11 ENCOUNTER — Ambulatory Visit (INDEPENDENT_AMBULATORY_CARE_PROVIDER_SITE_OTHER)

## 2024-02-11 ENCOUNTER — Encounter (INDEPENDENT_AMBULATORY_CARE_PROVIDER_SITE_OTHER): Payer: Self-pay | Admitting: Vascular Surgery

## 2024-02-11 ENCOUNTER — Ambulatory Visit (INDEPENDENT_AMBULATORY_CARE_PROVIDER_SITE_OTHER): Payer: PRIVATE HEALTH INSURANCE | Admitting: Vascular Surgery

## 2024-02-11 VITALS — BP 125/76 | HR 58 | Temp 98.2°F | Resp 16 | Ht 68.0 in | Wt 217.6 lb

## 2024-02-11 DIAGNOSIS — I2510 Atherosclerotic heart disease of native coronary artery without angina pectoris: Secondary | ICD-10-CM | POA: Diagnosis not present

## 2024-02-11 DIAGNOSIS — E118 Type 2 diabetes mellitus with unspecified complications: Secondary | ICD-10-CM

## 2024-02-11 DIAGNOSIS — Z89512 Acquired absence of left leg below knee: Secondary | ICD-10-CM

## 2024-02-11 DIAGNOSIS — I70211 Atherosclerosis of native arteries of extremities with intermittent claudication, right leg: Secondary | ICD-10-CM

## 2024-02-11 DIAGNOSIS — I10 Essential (primary) hypertension: Secondary | ICD-10-CM

## 2024-02-11 DIAGNOSIS — E785 Hyperlipidemia, unspecified: Secondary | ICD-10-CM

## 2024-02-11 DIAGNOSIS — E1169 Type 2 diabetes mellitus with other specified complication: Secondary | ICD-10-CM

## 2024-02-11 NOTE — Progress Notes (Signed)
 MRN : [id number]  [name] is a 68 y.o. (04-Mar-1956) male who presents with chief complaint of check circulation.  History of Present Illness:   The patient returns to the office for followup and review of the noninvasive studies.    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   There have been no interval changes in right lower extremity symptoms. No interval shortening of the patient's claudication distance or development of rest pain symptoms. No new ulcers or wounds have occurred since the last visit.   There have been no significant changes to the patient's overall health care.   The patient denies amaurosis fugax or recent TIA symptoms. There are no documented recent neurological changes noted. There is no history of DVT, PE or superficial thrombophlebitis. The patient denies recent episodes of angina or shortness of breath.    ABI Rt=1.13 (TBI=0.49) and Lt=BKA  (previous ABI's Rt= (TBI=0.60)  and Lt=BKA)  Current Meds  Medication Sig   ACCU-CHEK GUIDE TEST test strip USE TO TEST BLOOD SUGARS TWICE DAILY   Accu-Chek Softclix Lancets lancets daily.   ALPRAZolam  (XANAX ) 0.5 MG tablet Take 1 tablet (0.5 mg total) by mouth 3 (three) times daily as needed for anxiety or sleep.   aspirin  EC 81 MG tablet Take 1 tablet (81 mg total) by mouth daily. Swallow whole.   atorvastatin  (LIPITOR) 20 MG tablet Take 1 tablet (20 mg total) by mouth daily.   Blood Glucose Monitoring Suppl (ACCU-CHEK GUIDE ME) w/Device KIT USE AS DIRECTED EVERY DAY   Continuous Glucose Sensor (DEXCOM G7 SENSOR) MISC Use to monitor blood glucose continuously. Replace with new sensor every 10 days. E11.65, Z79.4   FARXIGA  10 MG TABS tablet Take 1 tablet (10 mg total) by mouth daily.   insulin  isophane & regular human KwikPen (NOVOLIN  70/30 KWIKPEN) (70-30) 100 UNIT/ML KwikPen INJECT 40 UNITS INTO THE SKIN 2 (TWO) TIMES DAILY WITH A  MEAL.   Insulin  Pen Needle 32G X 6 MM MISC 1 Application by Does not apply route in the morning and at bedtime.   levothyroxine  (SYNTHROID ) 50 MCG tablet TAKE 1 TABLET BY MOUTH EVERY DAY   losartan  (COZAAR ) 25 MG tablet Take 1 tablet (25 mg total) by mouth daily.   Melatonin 10 MG TABS Take 1 tablet by mouth at bedtime.   metoprolol  succinate (TOPROL -XL) 25 MG 24 hr tablet Take 1 tablet (25 mg total) by mouth daily.   spironolactone  (ALDACTONE ) 25 MG tablet TAKE 1 TABLET (25 MG TOTAL) BY MOUTH DAILY.   Torsemide  40 MG TABS Take 40 mg by mouth as needed (AS NEEDED for weight gain of 3 pounds overnight or 5 pounds in a week).    Past Medical History:  Diagnosis Date   CAD (coronary artery disease)    a. 05/2022 Cath: LM nl, LAD 82m/19m, 70d, D3 90, LCX 58m, 60d, OM1 sev diff dzs, OM2 85p, OM2 subbranch sev diff dzs, RCA 70p/m, 65m.   CHF (congestive heart failure) (HCC)    Chronic HFrEF (heart failure with reduced ejection fraction) (HCC)    a. 05/2022 Echo:  EF 25-30%, mild LVH, GrII DD, mid-apical, anteroseptal, and apical HK. Nl RV fxn. RVSP 51.106mmHg. Mildly dil LA. Mod MR/TR. Mild AS/AI.   Diabetes mellitus    Hematospermia    Hypertension    Ischemic cardiomyopathy    a. 05/2022 Echo: EF 25-30%.   Moderate Mitral regurgitation    Moderate tricuspid regurgitation    Nephrolithiasis 02/07/2007   PAD (peripheral artery disease) (HCC)    a. 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   Viral meningitis     Past Surgical History:  Procedure Laterality Date   AMPUTATION Left 05/19/2022   Procedure: AMPUTATION BELOW KNEE;  Surgeon: Jama Cordella MATSU, MD;  Location: [facility] ORS;  Service: Vascular;  Laterality: Left;   CHOLECYSTECTOMY  12-07-14   laparoscopic, per Dr. Althia    LOWER EXTREMITY ANGIOGRAPHY Left 05/16/2022   Procedure: Lower Extremity Angiography;  Surgeon: Jama Cordella MATSU, MD;  Location: [facility];  Service: Cardiovascular;  Laterality: Left;   RIGHT/LEFT HEART CATH AND CORONARY  ANGIOGRAPHY N/A 06/02/2022   Procedure: RIGHT/LEFT HEART CATH AND CORONARY ANGIOGRAPHY;  Surgeon: Mady Bruckner, MD;  Location: [facility] CV [facility];  Service: Cardiovascular;  Laterality: N/A;    Social History Social History   Tobacco Use   Smoking status: Former   Smokeless tobacco: Never   Tobacco comments:    22 yrs ago   Substance Use Topics   Alcohol use: No    Alcohol/week: 0.0 standard drinks of alcohol   Drug use: No    Family History Family History  Problem Relation Age of Onset   Rheum arthritis Mother    Diabetes Father    Heart disease Father    Cholecystitis Sister    Cholecystitis Brother    Diabetes Brother    Diabetes Brother    Parkinson's disease Brother    Heart disease Sister    Diabetes Sister    Cholecystitis Sister    Diabetes Sister    Arthritis Other    Hypertension Other    Stroke Other    Coronary artery disease Other    Birth defects Neg Hx     No Known Allergies   REVIEW OF SYSTEMS (Negative unless checked)  Constitutional: [] Weight loss  [] Fever  [] Chills Cardiac: [] Chest pain   [] Chest pressure   [] Palpitations   [] Shortness of breath when laying flat   [] Shortness of breath with exertion. Vascular:  [x] Pain in legs with walking   [] Pain in legs at rest  [] History of DVT   [] Phlebitis   [] Swelling in legs   [] Varicose veins   [] Non-healing ulcers Pulmonary:   [] Uses home oxygen   [] Productive cough   [] Hemoptysis   [] Wheeze  [] COPD   [] Asthma Neurologic:  [] Dizziness   [] Seizures   [] History of stroke   [] History of TIA  [] Aphasia   [] Vissual changes   [] Weakness or numbness in arm   [] Weakness or numbness in leg Musculoskeletal:   [] Joint swelling   [] Joint pain   [] Low back pain Hematologic:  [] Easy bruising  [] Easy bleeding   [] Hypercoagulable state   [] Anemic Gastrointestinal:  [] Diarrhea   [] Vomiting  [] Gastroesophageal reflux/heartburn   [] Difficulty swallowing. Genitourinary:  [] Chronic kidney disease   [] Difficult  urination  [] Frequent urination   [] Blood in urine Skin:  [] Rashes   [] Ulcers  Psychological:  [] History of anxiety   []  History of major depression.  Physical Examination  Vitals:   02/11/24 1416  BP: 125/76  Pulse: (!) 58  Height: 5' 8 (1.727 m)  Body mass index is 35.79 kg/m. Gen: WD/WN, NAD Head: [place]/AT, No temporalis wasting.  Ear/Nose/Throat: Hearing grossly intact, nares w/o erythema or drainage Eyes: PER, EOMI, sclera nonicteric.  Neck: Supple, no masses.  No bruit or JVD.  Pulmonary:  Good air movement, no audible wheezing, no use of accessory muscles.  Cardiac: RRR, normal S1, S2, no Murmurs. Vascular:  mild trophic changes, no open wounds Vessel Right Left  Radial Palpable Palpable  PT Not Palpable BKA  DP Not Palpable BKA  Gastrointestinal: soft, non-distended. No guarding/no peritoneal signs.  Musculoskeletal: M/S 5/5 throughout.  No visible deformity.  Neurologic: CN 2-12 intact. Pain and light touch intact in extremities.  Symmetrical.  Speech is fluent. Motor exam as listed above. Psychiatric: Judgment intact, Mood & affect appropriate for pt's clinical situation. Dermatologic: No rashes or ulcers noted.  No changes consistent with cellulitis.   CBC Lab Results  Component Value Date   WBC 7.6 12/27/2023   HGB 14.1 12/27/2023   HCT 41.3 12/27/2023   MCV 93.8 12/27/2023   PLT 150.0 12/27/2023    BMET    Component Value Date/Time   NA 138 12/27/2023 0919   K 4.6 12/27/2023 0919   CL 105 12/27/2023 0919   CO2 25 12/27/2023 0919   GLUCOSE 142 (H) 12/27/2023 0919   BUN 37 (H) 12/27/2023 0919   CREATININE 1.52 (H) 12/27/2023 0919   CREATININE 1.31 (H) 03/15/2020 1700   CALCIUM  9.2 12/27/2023 0919   GFRNONAA 49 (L) 05/31/2023 1407   GFRAA >60 01/10/2020 1107   CrCl cannot be calculated (Patient's most recent lab result is older than the maximum 21 days allowed.).  COAG Lab Results  Component Value Date   INR 1.1 05/31/2023   INR 1.1 05/27/2022     Radiology No results found.   Assessment/Plan 1. Atherosclerosis of native artery of right lower extremity with intermittent claudication (HCC) (Primary)  Recommend:   The patient has evidence of atherosclerosis of the lower extremities with claudication.  The patient does not voice lifestyle limiting changes at this point in time.   Noninvasive studies do not suggest clinically significant change.   No invasive studies, angiography or surgery at this time The patient should continue walking and begin a more formal exercise program.  The patient should continue antiplatelet therapy and aggressive treatment of the lipid abnormalities   No changes in the patient's medications at this time   Continued surveillance is indicated as atherosclerosis is likely to progress with time.     The patient will continue follow up with noninvasive studies as ordered.  - VAS US  ABI WITH/WO TBI; Future  2. Hx of BKA, left (HCC) He is continuing to work with Garrel Mulch regarding a refitted prosthesis  3. Essential hypertension Continue antihypertensive medications as already ordered, these medications have been reviewed and there are no changes at this time.  4. Coronary artery disease involving native coronary artery of native heart without angina pectoris Continue cardiac and antihypertensive medications as already ordered and reviewed, no changes at this time.  Continue statin as ordered and reviewed, no changes at this time  Nitrates PRN for chest pain  5. Diabetes mellitus type 2 with complications (HCC) Continue hypoglycemic medications as already ordered, these medications have been reviewed and there are no changes at this time.  Hgb A1C to be monitored as already arranged by primary service  6. Hyperlipidemia associated with type 2 diabetes mellitus (HCC) Continue statin as ordered and reviewed, no changes at  this time    Cordella Shawl, MD  02/11/2024 2:35 PM

## 2024-02-19 LAB — VAS US ABI WITH/WO TBI: Right ABI: 1.13

## 2024-02-24 ENCOUNTER — Encounter (INDEPENDENT_AMBULATORY_CARE_PROVIDER_SITE_OTHER): Payer: Self-pay | Admitting: Vascular Surgery

## 2024-02-29 ENCOUNTER — Ambulatory Visit: Payer: PRIVATE HEALTH INSURANCE | Admitting: Internal Medicine

## 2024-03-20 ENCOUNTER — Other Ambulatory Visit: Payer: Self-pay | Admitting: Cardiology

## 2024-03-30 ENCOUNTER — Other Ambulatory Visit: Payer: Self-pay | Admitting: Family Medicine

## 2024-03-30 DIAGNOSIS — E039 Hypothyroidism, unspecified: Secondary | ICD-10-CM

## 2024-04-10 ENCOUNTER — Ambulatory Visit (INDEPENDENT_AMBULATORY_CARE_PROVIDER_SITE_OTHER): Payer: PRIVATE HEALTH INSURANCE | Admitting: Vascular Surgery

## 2024-04-10 ENCOUNTER — Encounter (INDEPENDENT_AMBULATORY_CARE_PROVIDER_SITE_OTHER)

## 2024-04-16 ENCOUNTER — Ambulatory Visit: Payer: PRIVATE HEALTH INSURANCE | Admitting: Internal Medicine

## 2024-05-12 ENCOUNTER — Telehealth (INDEPENDENT_AMBULATORY_CARE_PROVIDER_SITE_OTHER): Payer: Self-pay

## 2024-05-12 NOTE — Telephone Encounter (Signed)
 He well could have just caused a deep bruise to the area and by walking on it so soon after the fall has caused repeated trauma.  We can see what we have available with myself or Schnier next week if he wants to come in for evaluation

## 2024-05-12 NOTE — Telephone Encounter (Signed)
 Patient stated he will give it little more time and return using his previous prosthesis because he was not experiencing pain with that one. He will contact the office if he would like to move forward with scheduling appt. Also he stated that he will contact his PCP if needed so.

## 2024-05-12 NOTE — Telephone Encounter (Signed)
 Patient left a message stated that he fell on 04/10/24 and experience bruising and swelling on the left bka. The patient stump swelling and brusing  has healed. The patient received his new on prosthesis on 04/22/24 and notice that he is still experiencing pain when he bare weight on the prosthetic and also feels rubbing on the outside of the stump. Patient would like to know if the bone could be broken or what could be causing the pain. He does not have any open wounds. Please Advise

## 2024-05-19 ENCOUNTER — Ambulatory Visit: Admitting: Family Medicine

## 2024-05-19 ENCOUNTER — Ambulatory Visit

## 2024-05-19 ENCOUNTER — Encounter: Payer: Self-pay | Admitting: Family Medicine

## 2024-05-19 VITALS — BP 128/76 | HR 66 | Temp 98.6°F | Wt 247.0 lb

## 2024-05-19 DIAGNOSIS — E039 Hypothyroidism, unspecified: Secondary | ICD-10-CM | POA: Diagnosis not present

## 2024-05-19 DIAGNOSIS — E1165 Type 2 diabetes mellitus with hyperglycemia: Secondary | ICD-10-CM | POA: Diagnosis not present

## 2024-05-19 DIAGNOSIS — S8012XA Contusion of left lower leg, initial encounter: Secondary | ICD-10-CM | POA: Diagnosis not present

## 2024-05-19 DIAGNOSIS — Z794 Long term (current) use of insulin: Secondary | ICD-10-CM | POA: Diagnosis not present

## 2024-05-19 LAB — POCT GLYCOSYLATED HEMOGLOBIN (HGB A1C): Hemoglobin A1C: 7.6 % — AB (ref 4.0–5.6)

## 2024-05-19 MED ORDER — LEVOTHYROXINE SODIUM 50 MCG PO TABS: 50.0000 ug | ORAL_TABLET | Freq: Every day | ORAL | 3 refills | Status: DC

## 2024-05-19 MED ORDER — FARXIGA 10 MG PO TABS: 10.0000 mg | ORAL_TABLET | Freq: Every day | ORAL | 3 refills | Status: DC

## 2024-05-19 MED ORDER — METOPROLOL SUCCINATE ER 25 MG PO TB24: 25.0000 mg | ORAL_TABLET | Freq: Every day | ORAL | 3 refills | Status: AC

## 2024-05-19 NOTE — Addendum Note (Signed)
 Addended by: LADONNA INOCENTE SAILOR on: 05/19/2024 05:22 PM   Modules accepted: Orders

## 2024-05-19 NOTE — Progress Notes (Addendum)
   Subjective:    Patient ID: [name], male    DOB: [birth year], 68 y.o.   MRN: [id number]  HPI Here for several issues. First he wants to follow up on his type 2 diabetes. Second he fell at home on 04-10-24 when his left lower leg prosthesis fell off, causing him to fall directly on the end of the stump. He had some bruising and swelling at first, but these have resolved. He had significant pain at first, but this has been steadily getting better.  His A1c today is 7.6%.   Review of Systems  Constitutional: Negative.   Respiratory: Negative.    Cardiovascular: Negative.   Musculoskeletal:  Positive for myalgias.       Objective:   Physical Exam Constitutional:      Comments: He is able to walk on his prosthesis easily   Cardiovascular:     Rate and Rhythm: Normal rate and regular rhythm.     Pulses: Normal pulses.     Heart sounds: Normal heart sounds.  Pulmonary:     Effort: Pulmonary effort is normal.     Breath sounds: Normal breath sounds.  Musculoskeletal:     Comments: The left lower leg stump is normal on exam. No discoloration or swelling or tenderness.   Neurological:     Mental Status: He is alert.           Assessment & Plan:  His type 2 diabetes is somewhat under control. He will stay on the current regimen. He also has a contusion to the left lower leg stump. We will get Xrays today to rule out any fractures.  Garnette Olmsted, MD

## 2024-05-22 ENCOUNTER — Encounter: Payer: Self-pay | Admitting: Family Medicine

## 2024-05-27 ENCOUNTER — Ambulatory Visit: Payer: Self-pay | Admitting: Family Medicine

## 2024-05-28 NOTE — Telephone Encounter (Signed)
 See results follow up note 05/27/2024.

## 2024-05-29 ENCOUNTER — Ambulatory Visit: Attending: Internal Medicine | Admitting: Internal Medicine

## 2024-05-29 ENCOUNTER — Encounter: Payer: Self-pay | Admitting: Internal Medicine

## 2024-05-29 VITALS — BP 138/70 | HR 70 | Ht 68.5 in | Wt 250.0 lb

## 2024-05-29 DIAGNOSIS — E1169 Type 2 diabetes mellitus with other specified complication: Secondary | ICD-10-CM | POA: Insufficient documentation

## 2024-05-29 DIAGNOSIS — Z79899 Other long term (current) drug therapy: Secondary | ICD-10-CM | POA: Diagnosis present

## 2024-05-29 DIAGNOSIS — I2510 Atherosclerotic heart disease of native coronary artery without angina pectoris: Secondary | ICD-10-CM | POA: Diagnosis present

## 2024-05-29 DIAGNOSIS — E785 Hyperlipidemia, unspecified: Secondary | ICD-10-CM | POA: Insufficient documentation

## 2024-05-29 DIAGNOSIS — I5022 Chronic systolic (congestive) heart failure: Secondary | ICD-10-CM

## 2024-05-29 DIAGNOSIS — I5020 Unspecified systolic (congestive) heart failure: Secondary | ICD-10-CM | POA: Diagnosis present

## 2024-05-29 DIAGNOSIS — I10 Essential (primary) hypertension: Secondary | ICD-10-CM | POA: Diagnosis present

## 2024-05-29 DIAGNOSIS — I5023 Acute on chronic systolic (congestive) heart failure: Secondary | ICD-10-CM

## 2024-05-29 MED ORDER — ATORVASTATIN CALCIUM 10 MG PO TABS: 10.0000 mg | ORAL_TABLET | Freq: Every day | ORAL | 3 refills | Status: AC

## 2024-05-29 NOTE — Patient Instructions (Signed)
 Medication Instructions:  Your physician recommends the following medication changes.  DECREASE: Atorvastatin  10 mg by mouth daily   INCREASE: Torsemide  40 mg by mouth daily for 3 days along with potassium, then decrease back to as needed   *If you need a refill on your cardiac medications before your next appointment, please call your pharmacy*  Lab Work: Your provider would like for you to have following labs drawn today BMP, BNP.  Your provider would like for you to return in 3 months to have the following labs drawn: Lipid, CMP.   Please go to [facility] [address] ([facility] [address], [place] [address] You do not need an appointment.  They are open from 8 am- 4:30 pm.  Lunch from 1:00 pm- 2:00 pm You will need to be fasting.      Testing/Procedures: No test ordered today   Follow-Up: At [facility], you and your health needs are our priority.  As part of our continuing mission to provide you with exceptional heart care, our providers are all part of one team.  This team includes your primary Cardiologist (physician) and Advanced Practice Providers or APPs (Physician Assistants and Nurse Practitioners) who all work together to provide you with the care you need, when you need it.  Your next appointment:   6 month(s)  Provider:   You may see Lonni Hanson, MD or one of the following Advanced Practice Providers on your designated Care Team:   Lonni Meager, NP Lesley Maffucci, PA-C Bernardino Bring, PA-C Cadence Powell, PA-C Tylene Lunch, NP Barnie Hila, NP

## 2024-05-29 NOTE — Progress Notes (Unsigned)
 Cardiology Office Note:  .   Date:  05/30/2024  ID:  [name], DOB 1955/07/16, MRN [id number] PCP: Johnny Garnette LABOR, MD  [facility] Providers Cardiologist:  Evalene Lunger, MD     History of Present Illness: .   [name] is a 68 y.o. male with history of severe multivessel coronary artery disease not amenable to CABG due to poor targets, chronic heart failure with recovered ejection fraction due to ischemic cardiomyopathy, PAD status post left BKA, hypertension, hyperlipidemia, type 2 diabetes mellitus, hypothyroidism, and anemia, who presents for follow-up of CAD and heart failure.  I last saw him in 02/2023, at which time he was feeling well from a heart standpoint other than exertional dyspnea when walking extended distances.  He noted at that time that his exertional dyspnea had actually improved over the preceding 6 months.  We did not make any medication changes or pursue additional testing.  Today, [name] reports he has been feeling fairly well without any chest pain.  He has occasional exertional dyspnea with strenuous activities, which is stable from prior visits.  He notes he had a fall in October after his left upper extremity prosthesis came loose and he fell onto his stump.  He noted some swelling and bruising, which has been improving.  X-rays through his PCPs office did not show any fractures.  He otherwise has not had any edema.  He also denies palpitations, lightheadedness, and orthopnea.  He notes that his blood pressure is usually well-controlled at home.  He has not taken his as needed torsemide  in months though he has been gaining quite a bit of weight.  He attributes this to be more sedentary and having some dietary indiscretion.  [name] notes that his wife has been concerned about statin therapy, particularly risks of myalgias and neurocognitive effects.  He has been tolerating atorvastatin  without any side effects.  ROS: See HPI  Studies Reviewed: SABRA   EKG  Interpretation Date/Time:  Thursday May 29 2024 16:07:17 EST Ventricular Rate:  70 PR Interval:  176 QRS Duration:  84 QT Interval:  398 QTC Calculation: 429 R Axis:   -24  Text Interpretation: Normal sinus rhythm Left ventricular hypertrophy with repolarization abnormality ( R in aVL ) Inferior infarct Abnormal ECG When compared with ECG of 21-Feb-2023 16:11, Criteria for Septal infarct are no longer Present Confirmed by Kao Berkheimer, Lonni [phone]) on 05/30/2024 8:51:22 PM    Risk Assessment/Calculations:             Physical Exam:   VS:  BP 138/70 (BP Location: Right Arm, Patient Position: Sitting, Cuff Size: Large)   Pulse 70   Ht 5' 8.5 (1.74 m)   Wt 250 lb (113.4 kg)   SpO2 100%   BMI 37.46 kg/m    Wt Readings from Last 3 Encounters:  05/29/24 250 lb (113.4 kg)  05/19/24 247 lb (112 kg)  12/27/23 235 lb 6.4 oz (106.8 kg)    General:  NAD. Neck: No JVD or HJR. Lungs: Clear to auscultation bilaterally without wheezes or crackles. Heart: Regular rate and rhythm without murmurs, rubs, or gallops. Abdomen: Soft, nontender, nondistended. Extremities: 1+ right pretibial edema.  LLE prosthesis present.  ASSESSMENT AND PLAN: .    Chronic heart failure with recovered ejection fraction: [name] has been feeling well with stable exertional dyspnea with modest activity.  His weight is up 25 pounds over the last year, which he attributes to inactivity and dietary indiscretion.  However,  he has some edema on the right lower extremity suggesting that fluid retention is contributing to this.  I have asked him to take his torsemide  for 3 days and then to use it more liberally for weight gain/edema on an as needed basis.  Continue current regimen of of dapagliflozin , losartan , metoprolol  succinate, and spironolactone  for GDMT.  We will check a BMP and BNP today.  Coronary artery disease: [name] has a history of severe multivessel CAD not amenable to CABG and also suboptimal for PCI  though if he were to become symptomatic or have recurrent decline in EF, one could consider repeat catheterization and PCI to the mid LAD.  I do not believe that his RCA is a suitable PCI target.  Continue aspirin  and statin therapy; given concerns about potential side effects and very low LDL on last check, we will reduce his atorvastatin  to 10 mg daily.  We will plan to repeat a lipid panel and CMP in 3 months.  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  We discussed the risks and benefits of statin therapy.  Given that his most recent LDL was very low (28), we will reduce atorvastatin  to 10 mg daily and repeat a lipid panel and CMP in 3 months.  Ongoing management of DM per Dr. Johnny.  Hypertension: Blood pressure mildly elevated today (goal less than 130/80).  Home blood pressures are typically a bit better.  Defer medication changes today the low threshold for escalation of GDMT to help optimize heart failure treatment and improve blood pressure.    Dispo: Return to clinic in 6 months.  Signed, Lonni Hanson, MD

## 2024-05-30 ENCOUNTER — Other Ambulatory Visit: Payer: Self-pay | Admitting: Emergency Medicine

## 2024-05-30 ENCOUNTER — Encounter: Payer: Self-pay | Admitting: Internal Medicine

## 2024-05-30 ENCOUNTER — Other Ambulatory Visit: Payer: Self-pay | Admitting: Internal Medicine

## 2024-05-30 MED ORDER — TORSEMIDE 40 MG PO TABS: 40.0000 mg | ORAL_TABLET | ORAL | 3 refills | Status: DC | PRN

## 2024-05-30 MED ORDER — POTASSIUM CHLORIDE CRYS ER 20 MEQ PO TBCR: 40.0000 meq | EXTENDED_RELEASE_TABLET | ORAL | 3 refills | Status: AC | PRN

## 2024-05-30 NOTE — Progress Notes (Signed)
 Prescription Refills for Torsemide  and Potassium sent to pharmacy of choice of the patient ([facility] in [place]).

## 2024-05-31 LAB — BASIC METABOLIC PANEL WITH GFR
BUN/Creatinine Ratio: 15 (ref 10–24)
BUN: 25 mg/dL (ref 8–27)
CO2: 18 mmol/L — ABNORMAL LOW (ref 20–29)
Calcium: 9.6 mg/dL (ref 8.6–10.2)
Chloride: 105 mmol/L (ref 96–106)
Creatinine, Ser: 1.62 mg/dL — ABNORMAL HIGH (ref 0.76–1.27)
Glucose: 142 mg/dL — ABNORMAL HIGH (ref 70–99)
Potassium: 5.6 mmol/L — ABNORMAL HIGH (ref 3.5–5.2)
Sodium: 141 mmol/L (ref 134–144)
eGFR: 47 mL/min/1.73 — ABNORMAL LOW (ref >59)

## 2024-05-31 LAB — LIPID PANEL
Chol/HDL Ratio: 2.1 ratio (ref 0.0–5.0)
Cholesterol, Total: 80 mg/dL — ABNORMAL LOW (ref 100–199)
HDL: 39 mg/dL — ABNORMAL LOW (ref >39)
LDL Chol Calc (NIH): 25 mg/dL (ref 0–99)
Triglycerides: 76 mg/dL (ref 0–149)
VLDL Cholesterol Cal: 16 mg/dL (ref 5–40)

## 2024-05-31 LAB — BRAIN NATRIURETIC PEPTIDE: BNP: 52.6 pg/mL (ref 0.0–100.0)

## 2024-06-01 ENCOUNTER — Ambulatory Visit: Payer: Self-pay | Admitting: Internal Medicine

## 2024-06-01 DIAGNOSIS — I5022 Chronic systolic (congestive) heart failure: Secondary | ICD-10-CM

## 2024-06-01 DIAGNOSIS — Z79899 Other long term (current) drug therapy: Secondary | ICD-10-CM

## 2024-06-02 MED ORDER — TORSEMIDE 20 MG PO TABS: 40.0000 mg | ORAL_TABLET | Freq: Every day | ORAL | 3 refills | Status: DC

## 2024-06-02 MED ORDER — TORSEMIDE 20 MG PO TABS: 40.0000 mg | ORAL_TABLET | Freq: Every day | ORAL | 3 refills | Status: AC | PRN

## 2024-06-02 MED ORDER — SPIRONOLACTONE 25 MG PO TABS: 12.5000 mg | ORAL_TABLET | Freq: Every day | ORAL | 3 refills | Status: DC

## 2024-06-02 NOTE — Telephone Encounter (Signed)
 Pt of Dr. Mady. Please advise on this Pharmacy request.

## 2024-06-19 ENCOUNTER — Other Ambulatory Visit: Payer: Self-pay | Admitting: Internal Medicine

## 2024-06-19 LAB — BASIC METABOLIC PANEL WITH GFR
BUN/Creatinine Ratio: 22 (ref 10–24)
BUN: 39 mg/dL — ABNORMAL HIGH (ref 8–27)
CO2: 18 mmol/L — ABNORMAL LOW (ref 20–29)
Calcium: 8.7 mg/dL (ref 8.6–10.2)
Chloride: 107 mmol/L — ABNORMAL HIGH (ref 96–106)
Creatinine, Ser: 1.74 mg/dL — ABNORMAL HIGH (ref 0.76–1.27)
Glucose: 226 mg/dL — ABNORMAL HIGH (ref 70–99)
Potassium: 5.5 mmol/L — ABNORMAL HIGH (ref 3.5–5.2)
Sodium: 137 mmol/L (ref 134–144)
eGFR: 43 mL/min/1.73 — ABNORMAL LOW

## 2024-06-20 ENCOUNTER — Other Ambulatory Visit: Payer: Self-pay | Admitting: *Deleted

## 2024-06-25 ENCOUNTER — Ambulatory Visit: Payer: PRIVATE HEALTH INSURANCE | Admitting: Internal Medicine

## 2024-06-25 ENCOUNTER — Other Ambulatory Visit: Payer: Self-pay | Admitting: Family Medicine

## 2024-06-25 DIAGNOSIS — E039 Hypothyroidism, unspecified: Secondary | ICD-10-CM

## 2024-06-26 ENCOUNTER — Ambulatory Visit (INDEPENDENT_AMBULATORY_CARE_PROVIDER_SITE_OTHER): Payer: PRIVATE HEALTH INSURANCE | Admitting: Family Medicine

## 2024-06-26 ENCOUNTER — Other Ambulatory Visit: Payer: Self-pay | Admitting: Family

## 2024-06-26 ENCOUNTER — Encounter: Payer: Self-pay | Admitting: Family Medicine

## 2024-06-26 VITALS — BP 116/71 | HR 62 | Wt 249.0 lb

## 2024-06-26 DIAGNOSIS — Z0000 Encounter for general adult medical examination without abnormal findings: Secondary | ICD-10-CM

## 2024-06-26 NOTE — Patient Instructions (Signed)
 I really enjoyed getting to talk with you today! I am available on Tuesdays and Thursdays for virtual visits if you have any questions or concerns, or if I can be of any further assistance.   CHECKLIST FROM ANNUAL WELLNESS VISIT:  -Follow up (please call to schedule if not scheduled after visit):   -yearly for annual wellness visit with primary care office  Here is a list of your preventive care/health maintenance measures and the plan for each if any are due:  PLAN For any measures below that may be due:    1. Please let us  know if you change your mind about colon cancer screening.   2. You can get vaccines at the pharmacy - please let us  know if you do so that we can update your record.   3. Please ask your specialist to send copies of your foot exam and diabetic eye exam. Thanks.   Health Maintenance  Topic Date Due   Medicare Annual Wellness (AWV)  Never done   Pneumococcal Vaccine: 50+ Years (1 of 2 - PCV) Never done   Zoster Vaccines- Shingrix (1 of 2) Never done   Diabetic kidney evaluation - Urine ACR  07/21/2009   FOOT EXAM  08/23/2022   OPHTHALMOLOGY EXAM  09/16/2022   COVID-19 Vaccine (1) 07/12/2024 (Originally 06/20/1956)   Influenza Vaccine  09/16/2024 (Originally 01/18/2024)   Colonoscopy  06/26/2025 (Originally 12/19/2002)   HEMOGLOBIN A1C  11/17/2024   DTaP/Tdap/Td (2 - Td or Tdap) 05/09/2025   Diabetic kidney evaluation - eGFR measurement  06/18/2025   Hepatitis C Screening  Completed   Meningococcal B Vaccine  Aged Out    -See a dentist at least yearly  -Get your eyes checked and then per your eye specialist's recommendations  -Other issues addressed today:   -I have included below further information regarding a healthy whole foods based diet, physical activity guidelines for adults, stress management and opportunities for social connections. I hope you find this information useful.    -----------------------------------------------------------------------------------------------------------------------------------------------------------------------------------------------------------------------------------------------------------    NUTRITION: -eat real food: lots of colorful vegetables (half the plate) and fruits -5-7 servings of vegetables and fruits per day (fresh or steamed is best), exp. 2 servings of vegetables with lunch and dinner and 2 servings of fruit per day. Berries and greens such as kale and collards are great choices.  -consume on a regular basis:  fresh fruits, fresh veggies, fish, nuts, seeds, healthy oils (such as olive oil, avocado oil), whole grains (make sure for bread/pasta/crackers/etc., that the first ingredient on label contains the word whole), legumes. -can eat small amounts of dairy and lean meat (no larger than the palm of your hand), but avoid processed meats such as ham, bacon, lunch meat, etc. -drink water -try to avoid fast food and pre-packaged foods, processed meat, ultra processed foods/beverages (donuts, candy, etc.) -most experts advise limiting sodium to < 2300mg  per day, should limit further is any chronic conditions such as high blood pressure, heart disease, diabetes, etc. The American Heart Association advised that < 1500mg  is is ideal -try to avoid foods/beverages that contain any ingredients with names you do not recognize  -try to avoid foods/beverages  with added sugar or sweeteners/sweets  -try to avoid sweet drinks (including diet drinks): soda, juice, Gatorade, sweet tea, power drinks, diet drinks -try to avoid white rice, white bread, pasta (unless whole grain)  EXERCISE GUIDELINES FOR ADULTS: -if you wish to increase your physical activity, do so gradually and with the approval of your doctor -  STOP and seek medical care immediately if you have any chest pain, chest discomfort or trouble breathing when starting or  increasing exercise  -move and stretch your body, legs, feet and arms when sitting for Dials periods -Physical activity guidelines for optimal health in adults: -get at least 150 minutes per week of moderate exercise (can talk, but not sing); this is about 20-30 minutes of sustained activity 5-7 days per week or two 10-15 minute episodes of sustained activity 5-7 days per week -do some muscle building/resistance training/strength training at least 2 days per week  -balance exercises 3+ days per week:   Stand somewhere where you have something sturdy to hold onto if you lose balance    1) lift up on toes, then back down, start with 5x per day and work up to 20x   2) stand and lift one leg straight out to the side so that foot is a few inches of the floor, start with 5x each side and work up to 20x each side   3) stand on one foot, start with 5 seconds each side and work up to 20 seconds on each side  If you need ideas or help with getting more active:  -Silver sneakers https://tools.silversneakers.com  -Walk with a Doc: Http://www.duncan-williams.com/  -try to include resistance (weight lifting/strength building) and balance exercises twice per week: or the following link for ideas: http://castillo-powell.com/  buyducts.dk  STRESS MANAGEMENT: -can try meditating, or just sitting quietly with deep breathing while intentionally relaxing all parts of your body for 5 minutes daily -if you need further help with stress, anxiety or depression please follow up with your primary doctor or contact the wonderful folks at [facility]: [phone]  SOCIAL CONNECTIONS: -options in [place] if you wish to engage in more social and exercise related activities:  -Silver sneakers https://tools.silversneakers.com  -Walk with a Doc: Http://www.duncan-williams.com/  -Check out the [facility] Active Adults 50+  section on the [place] [employer] (hiking clubs, book clubs, cards and games, chess, exercise classes, aquatic classes and much more) - see the website for details: https://www.[place].gov/departments/parks-recreation/active-adults50  -YouTube has lots of exercise videos for different ages and abilities as well  -[facility] (a variety of indoor and outdoor inperson activities for adults). [phone]. [address]  -Virtual Online Classes (a variety of topics): see seniorplanet.org or call [phone]  -consider volunteering at a school, hospice center, church, senior center or elsewhere

## 2024-06-26 NOTE — Telephone Encounter (Signed)
 Pt no longer seen at [facility].

## 2024-06-26 NOTE — Progress Notes (Signed)
 "  ----------------------------------------------------------------------------------------------------------------------------------------------------------------------------------------------------------------------  Because this visit was a virtual/telehealth visit, some criteria may be missing or patient reported. Any vitals not documented were not able to be obtained and vitals that have been documented are patient reported.    MEDICARE ANNUAL PREVENTIVE CARE VISIT WITH PROVIDER (Welcome to Medicare, initial annual wellness or annual wellness exam)  Virtual Visit via Video Note  I connected with [name] on 06/26/2024  by a video enabled telemedicine application and verified that I am speaking with the correct person using two identifiers.  Location patient: home Location provider:work or home office Persons participating in the virtual visit: patient, provider, wife  Concerns and/or follow up today: detailed intake and health/risks assessment completed on flow sheets and below- please see for details. Reports nothing new. Had a fall back in Dec. Has recovered and is doing ok - he was in a hurry ad prosthetic fell off.   How often do you have a drink containing alcohol?na How many drinks containing alcohol do you have on a typical day when you are drinking?na How often do you have six or more drinks on one occasion?na Have you ever smoked?y Quit date if applicable? >30 years ago  How many packs a day do/did you smoke? na Do you use smokeless tobacco?na Do you use an illicit drugs? n Do you feel safe at home?y Last dentist visit?doesn't go to the dentist Last eye Exam and location? Dr. Jarold - goes every 14 weeks   See HM section in Epic for other details of completed HM.    ROS: negative for report of fevers, unintentional weight loss, vision changes, vision loss, hearing loss or change, chest pain, sob, hemoptysis, melena, hematochezia, hematuria,bleeding or  bruising  Patient-completed extensive health risk assessment - reviewed and discussed with the patient: See Health Risk Assessment completed with patient prior to the visit either above or in recent phone note. This was reviewed in detailed with the patient today and appropriate recommendations, orders and referrals were placed as needed per Summary below and patient instructions.   Review of Medical History: -PMH, PSH, Family History and current specialty and care providers reviewed and updated and listed below   Patient Care Team: Johnny Garnette LABOR, MD as PCP - General (Family Medicine) Perla, Evalene PARAS, MD as PCP - Cardiology (Cardiology) Tobie Tonita POUR, DO as Consulting Physician (Neurology) Jarold Mayo, MD as Consulting Physician (Ophthalmology)   Past Medical History:  Diagnosis Date   CAD (coronary artery disease)    a. 05/2022 Cath: LM nl, LAD 34m/89m, 70d, D3 90, LCX 56m, 60d, OM1 sev diff dzs, OM2 85p, OM2 subbranch sev diff dzs, RCA 70p/m, 26m.   CHF (congestive heart failure) (HCC)    Chronic HFrEF (heart failure with reduced ejection fraction) (HCC)    a. 05/2022 Echo: EF 25-30%, mild LVH, GrII DD, mid-apical, anteroseptal, and apical HK. Nl RV fxn. RVSP 51.80mmHg. Mildly dil LA. Mod MR/TR. Mild AS/AI.   Diabetes mellitus    Hematospermia    Hypertension    Ischemic cardiomyopathy    a. 05/2022 Echo: EF 25-30%.   Moderate Mitral regurgitation    Moderate tricuspid regurgitation    Nephrolithiasis 02/07/2007   PAD (peripheral artery disease)    a. 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   Viral meningitis     Past Surgical History:  Procedure Laterality Date   AMPUTATION Left 05/19/2022   Procedure: AMPUTATION BELOW KNEE;  Surgeon: Jama Cordella MATSU, MD;  Location: [facility] ORS;  Service: Vascular;  Laterality: Left;   CHOLECYSTECTOMY  12-07-14   laparoscopic, per Dr. Althia    LOWER EXTREMITY ANGIOGRAPHY Left 05/16/2022   Procedure: Lower Extremity Angiography;  Surgeon: Jama Cordella MATSU, MD;  Location: [facility] INVASIVE CV LAB;  Service: Cardiovascular;  Laterality: Left;   RIGHT/LEFT HEART CATH AND CORONARY ANGIOGRAPHY N/A 06/02/2022   Procedure: RIGHT/LEFT HEART CATH AND CORONARY ANGIOGRAPHY;  Surgeon: Mady Bruckner, MD;  Location: [facility] INVASIVE CV LAB;  Service: Cardiovascular;  Laterality: N/A;    Social History   Socioeconomic History   Marital status: Married    Spouse name: Not on file   Number of children: 2   Years of education: Not on file   Highest education level: Not on file  Occupational History   Not on file  Tobacco Use   Smoking status: Former   Smokeless tobacco: Never   Tobacco comments:    22 yrs ago   Vaping Use   Vaping status: Never Used  Substance and Sexual Activity   Alcohol use: No    Alcohol/week: 0.0 standard drinks of alcohol   Drug use: No   Sexual activity: Not on file  Other Topics Concern   Not on file  Social History Narrative   Right Handed   Drinks little caffeine - only diet sodas if so   One Story Home    5 Grandchildren   Social Drivers of Health   Tobacco Use: Medium Risk (06/26/2024)   Patient History    Smoking Tobacco Use: Former    Smokeless Tobacco Use: Never    Passive Exposure: Not on file  Financial Resource Strain: Low Risk (06/26/2024)   Overall Financial Resource Strain (CARDIA)    Difficulty of Paying Living Expenses: Not hard at all  Food Insecurity: No Food Insecurity (06/26/2024)   Epic    Worried About Radiation Protection Practitioner of Food in the Last Year: Never true    Ran Out of Food in the Last Year: Never true  Transportation Needs: No Transportation Needs (06/26/2024)   Epic    Lack of Transportation (Medical): No    Lack of Transportation (Non-Medical): No  Physical Activity: Inactive (06/26/2024)   Exercise Vital Sign    Days of Exercise per Week: 0 days    Minutes of Exercise per Session: Not on file  Stress: No Stress Concern Present (06/26/2024)   [employer] of Occupational Health -  Occupational Stress Questionnaire    Feeling of Stress: Not at all  Social Connections: Moderately Integrated (06/26/2024)   Social Connection and Isolation Panel    Frequency of Communication with Friends and Family: More than three times a week    Frequency of Social Gatherings with Friends and Family: Once a week    Attends Religious Services: More than 4 times per year    Active Member of [employer] or Organizations: No    Attends Banker Meetings: Never    Marital Status: Married  Catering Manager Violence: Not At Risk (06/26/2024)   Epic    Fear of Current or Ex-Partner: No    Emotionally Abused: No    Physically Abused: No    Sexually Abused: No  Depression (PHQ2-9): Low Risk (06/26/2024)   Depression (PHQ2-9)    PHQ-2 Score: 0  Alcohol Screen: Low Risk (06/26/2024)   Alcohol Screen    Last Alcohol Screening Score (AUDIT): 0  Housing: Low Risk (06/26/2024)   Epic    Unable to Pay for Housing in the Last Year: No  Number of Times Moved in the Last Year: 0    Homeless in the Last Year: No  Utilities: Not At Risk (06/26/2024)   Epic    Threatened with loss of utilities: No  Health Literacy: Adequate Health Literacy (06/26/2024)   B1300 Health Literacy    Frequency of need for help with medical instructions: Never    Family History  Problem Relation Age of Onset   Rheum arthritis Mother    Diabetes Father    Heart disease Father    Cholecystitis Sister    Cholecystitis Brother    Diabetes Brother    Diabetes Brother    Parkinson's disease Brother    Heart disease Sister    Diabetes Sister    Cholecystitis Sister    Diabetes Sister    Arthritis Other    Hypertension Other    Stroke Other    Coronary artery disease Other    Birth defects Neg Hx     Medications Ordered Prior to Encounter[1]  Allergies[2]     Physical Exam Vitals requested from patient and listed below if patient had equipment and was able to obtain at home for this virtual visit: Vitals:    06/26/24 1109  BP: 116/71  Pulse: 62   Estimated body mass index is 37.31 kg/m as calculated from the following:   Height as of 05/29/24: 5' 8.5 (1.74 m).   Weight as of this encounter: 249 lb (112.9 kg).  EKG (optional): deferred due to virtual visit  GENERAL: alert, oriented, no acute distress detected; full vision exam deferred due to pandemic and/or virtual encounter  HEENT: atraumatic, conjunttiva clear, no obvious abnormalities on inspection of external nose and ears  NECK: normal movements of the head and neck  LUNGS: on inspection no signs of respiratory distress, breathing rate appears normal, no obvious gross SOB, gasping or wheezing  CV: no obvious cyanosis  MS: moves all visible extremities without noticeable abnormality  PSYCH/NEURO: pleasant and cooperative, no obvious depression or anxiety, speech and thought processing grossly intact, Cognitive function grossly intact  Flowsheet Row Office Visit from 06/14/2022 in [facility] [place]  PHQ-9 Total Score 4        06/26/2024   10:30 AM 12/27/2023    8:49 AM 06/14/2022   11:07 AM 01/02/2022    2:22 PM 08/23/2021    9:41 AM  Depression screen PHQ 2/9  Decreased Interest 0 0 0 0 0  Down, Depressed, Hopeless 0 0 1 1 0  PHQ - 2 Score 0 0 1 1 0  Altered sleeping   1 1 1   Tired, decreased energy   1 1 1   Change in appetite   0 1 0  Feeling bad or failure about yourself    0 0 0  Trouble concentrating   1 1 1   Moving slowly or fidgety/restless   0 0 0  Suicidal thoughts   0 0 0  PHQ-9 Score   4  5  3    Difficult doing work/chores   Not difficult at all Not difficult at all      Data saved with a previous flowsheet row definition       01/16/2023    9:23 AM 05/19/2024    3:54 PM 06/25/2024    1:47 PM 06/26/2024    9:55 AM 06/26/2024   10:36 AM  Fall Risk  Falls in the past year? 0 1 1  1    Was there an injury with Fall? 0  1 0  1   Was there an injury with Fall? - Comments  Left leg  injury     Fall Risk Category Calculator 0 2 1  2    Patient at Risk for Falls Due to  History of fall(s)  No Fall Risks History of fall(s)  Fall risk Follow up Falls evaluation completed Falls evaluation completed  Falls evaluation completed Falls evaluation completed;Education provided     Manually entered by patient   Data saved with a previous flowsheet row definition     SUMMARY AND PLAN:  Medicare annual wellness visit, subsequent   Discussed applicable health maintenance/preventive health measures and advised and referred or ordered per patient preferences: -he declined colon cancer screening and several vaccines, I discussed each with him and he reports he has discussed theses with his PCP as well -he is considering pneumonia and shingles vaccines and know can get at the pharmacy -reports does his foot exam with specialist and says he will have them send report, same with eye exam -he plans to request lab exam the next time he is in the office Health Maintenance  Topic Date Due   Pneumococcal Vaccine: 50+ Years (1 of 2 - PCV) Never done   Zoster Vaccines- Shingrix (1 of 2) Never done   Diabetic kidney evaluation - Urine ACR  07/21/2009   FOOT EXAM  08/23/2022   OPHTHALMOLOGY EXAM  09/16/2022   COVID-19 Vaccine (1) 07/12/2024 (Originally 06/20/1956)   Influenza Vaccine  09/16/2024 (Originally 01/18/2024)   Colonoscopy  06/26/2025 (Originally 12/19/2002)   HEMOGLOBIN A1C  11/17/2024   DTaP/Tdap/Td (2 - Td or Tdap) 05/09/2025   Diabetic kidney evaluation - eGFR measurement  06/18/2025   Medicare Annual Wellness (AWV)  06/26/2025   Hepatitis C Screening  Completed   Meningococcal B Vaccine  Aged Out     Education and counseling on the following was provided based on the above review of health and a plan/checklist for the patient, along with additional information discussed, was provided for the patient in the patient instructions :   -Provided counseling and plan for increased  risk of falling if applicable per above screening. -Advised and counseled on a healthy lifestyle - including the importance of a healthy diet, regular physical activity, social connections and stress management. -Reviewed patient's current diet. Advised and counseled on a whole foods based healthy diet. A summary of a healthy diet was provided in the Patient Instructions.  -reviewed patient's current physical activity level and discussed exercise guidelines for adults. Discussed community resources and ideas for safe exercise at home to assist in meeting exercise guideline recommendations in a safe and healthy way.  -Advise yearly dental visits at minimum and regular eye exams  Follow up: see patient instructions   Patient Instructions  I really enjoyed getting to talk with you today! I am available on Tuesdays and Thursdays for virtual visits if you have any questions or concerns, or if I can be of any further assistance.   CHECKLIST FROM ANNUAL WELLNESS VISIT:  -Follow up (please call to schedule if not scheduled after visit):   -yearly for annual wellness visit with primary care office  Here is a list of your preventive care/health maintenance measures and the plan for each if any are due:  PLAN For any measures below that may be due:    1. Please let us  know if you change your mind about colon cancer screening.   2. You can get vaccines at the pharmacy -  please let us  know if you do so that we can update your record.   3. Please ask your specialist to send copies of your foot exam and diabetic eye exam. Thanks.   Health Maintenance  Topic Date Due   Medicare Annual Wellness (AWV)  Never done   Pneumococcal Vaccine: 50+ Years (1 of 2 - PCV) Never done   Zoster Vaccines- Shingrix (1 of 2) Never done   Diabetic kidney evaluation - Urine ACR  07/21/2009   FOOT EXAM  08/23/2022   OPHTHALMOLOGY EXAM  09/16/2022   COVID-19 Vaccine (1) 07/12/2024 (Originally 06/20/1956)   Influenza  Vaccine  09/16/2024 (Originally 01/18/2024)   Colonoscopy  06/26/2025 (Originally 12/19/2002)   HEMOGLOBIN A1C  11/17/2024   DTaP/Tdap/Td (2 - Td or Tdap) 05/09/2025   Diabetic kidney evaluation - eGFR measurement  06/18/2025   Hepatitis C Screening  Completed   Meningococcal B Vaccine  Aged Out    -See a dentist at least yearly  -Get your eyes checked and then per your eye specialist's recommendations  -Other issues addressed today:   -I have included below further information regarding a healthy whole foods based diet, physical activity guidelines for adults, stress management and opportunities for social connections. I hope you find this information useful.   -----------------------------------------------------------------------------------------------------------------------------------------------------------------------------------------------------------------------------------------------------------    NUTRITION: -eat real food: lots of colorful vegetables (half the plate) and fruits -5-7 servings of vegetables and fruits per day (fresh or steamed is best), exp. 2 servings of vegetables with lunch and dinner and 2 servings of fruit per day. Berries and greens such as kale and collards are great choices.  -consume on a regular basis:  fresh fruits, fresh veggies, fish, nuts, seeds, healthy oils (such as olive oil, avocado oil), whole grains (make sure for bread/pasta/crackers/etc., that the first ingredient on label contains the word whole), legumes. -can eat small amounts of dairy and lean meat (no larger than the palm of your hand), but avoid processed meats such as ham, bacon, lunch meat, etc. -drink water -try to avoid fast food and pre-packaged foods, processed meat, ultra processed foods/beverages (donuts, candy, etc.) -most experts advise limiting sodium to < 2300mg  per day, should limit further is any chronic conditions such as high blood pressure, heart disease, diabetes,  etc. The American Heart Association advised that < 1500mg  is is ideal -try to avoid foods/beverages that contain any ingredients with names you do not recognize  -try to avoid foods/beverages  with added sugar or sweeteners/sweets  -try to avoid sweet drinks (including diet drinks): soda, juice, Gatorade, sweet tea, power drinks, diet drinks -try to avoid white rice, white bread, pasta (unless whole grain)  EXERCISE GUIDELINES FOR ADULTS: -if you wish to increase your physical activity, do so gradually and with the approval of your doctor -STOP and seek medical care immediately if you have any chest pain, chest discomfort or trouble breathing when starting or increasing exercise  -move and stretch your body, legs, feet and arms when sitting for [place] periods -Physical activity guidelines for optimal health in adults: -get at least 150 minutes per week of moderate exercise (can talk, but not sing); this is about 20-30 minutes of sustained activity 5-7 days per week or two 10-15 minute episodes of sustained activity 5-7 days per week -do some muscle building/resistance training/strength training at least 2 days per week  -balance exercises 3+ days per week:   Stand somewhere where you have something sturdy to hold onto if you lose balance  1) lift up on toes, then back down, start with 5x per day and work up to 20x   2) stand and lift one leg straight out to the side so that foot is a few inches of the floor, start with 5x each side and work up to 20x each side   3) stand on one foot, start with 5 seconds each side and work up to 20 seconds on each side  If you need ideas or help with getting more active:  -Silver sneakers https://tools.silversneakers.com  -Walk with a Doc: Http://www.duncan-williams.com/  -try to include resistance (weight lifting/strength building) and balance exercises twice per week: or the following link for  ideas: http://castillo-powell.com/  buyducts.dk  STRESS MANAGEMENT: -can try meditating, or just sitting quietly with deep breathing while intentionally relaxing all parts of your body for 5 minutes daily -if you need further help with stress, anxiety or depression please follow up with your primary doctor or contact the wonderful folks at [facility]: [phone]  SOCIAL CONNECTIONS: -options in [place] if you wish to engage in more social and exercise related activities:  -Silver sneakers https://tools.silversneakers.com  -Walk with a Doc: Http://www.duncan-williams.com/  -Check out the [facility] Active Adults 50+ section on the [place] of [employer] companies (hiking clubs, book clubs, cards and games, chess, exercise classes, aquatic classes and much more) - see the website for details: https://www.[place].gov/departments/parks-recreation/active-adults50  -YouTube has lots of exercise videos for different ages and abilities as well  -[facility] (a variety of indoor and outdoor inperson activities for adults). [phone]. [address].  -Virtual Online Classes (a variety of topics): see seniorplanet.org or call [phone]  -consider volunteering at a school, hospice center, church, senior center or elsewhere            Chiquita JONELLE Cramp, DO     [1]  Current Outpatient Medications on File Prior to Visit  Medication Sig Dispense Refill   ACCU-CHEK GUIDE TEST test strip USE TO TEST BLOOD SUGARS TWICE DAILY 200 strip 3   Accu-Chek Softclix Lancets lancets daily.     ALPRAZolam  (XANAX ) 0.5 MG tablet Take 1 tablet (0.5 mg total) by mouth 3 (three) times daily as needed for anxiety or sleep. 270 tablet 1   atorvastatin  (LIPITOR) 10 MG tablet Take 1 tablet (10 mg total) by mouth daily. 90 tablet 3   Blood Glucose Monitoring Suppl (ACCU-CHEK GUIDE  ME) w/Device KIT USE AS DIRECTED EVERY DAY     Continuous Glucose Sensor (DEXCOM G7 SENSOR) MISC Use to monitor blood glucose continuously. Replace with new sensor every 10 days. E11.65, Z79.4 3 each 5   FARXIGA  10 MG TABS tablet Take 1 tablet (10 mg total) by mouth daily. 90 tablet 3   insulin  isophane & regular human KwikPen (NOVOLIN  70/30 KWIKPEN) (70-30) 100 UNIT/ML KwikPen INJECT 40 UNITS INTO THE SKIN 2 (TWO) TIMES DAILY WITH A MEAL. 100 mL 3   Insulin  Pen Needle 32G X 6 MM MISC 1 Application by Does not apply route in the morning and at bedtime. 100 each 5   levothyroxine  (SYNTHROID ) 50 MCG tablet TAKE 1 TABLET BY MOUTH EVERY DAY 90 tablet 3   losartan  (COZAAR ) 25 MG tablet Take 1 tablet (25 mg total) by mouth daily. 90 tablet 3   Melatonin 10 MG TABS Take 1 tablet by mouth at bedtime.     metoprolol  succinate (TOPROL -XL) 25 MG 24 hr tablet Take 1 tablet (25 mg total) by mouth daily. 90 tablet 3  potassium chloride  SA (KLOR-CON  M) 20 MEQ tablet Take 2 tablets (40 mEq total) by mouth as needed (takes with Torsemide ). 90 tablet 3   torsemide  (DEMADEX ) 20 MG tablet Take 2 tablets (40 mg total) by mouth daily as needed (weight gain of 3 lbs overnight or 5 lbs in a week). 60 tablet 3   No current facility-administered medications on file prior to visit.  [2] No Known Allergies  "

## 2024-07-03 ENCOUNTER — Encounter: Payer: Self-pay | Admitting: Family Medicine

## 2024-07-03 MED ORDER — DAPAGLIFLOZIN PROPANEDIOL 10 MG PO TABS: 10.0000 mg | ORAL_TABLET | Freq: Every day | ORAL | 3 refills | Status: AC

## 2024-07-03 NOTE — Telephone Encounter (Signed)
 I sent in a generic RX

## 2024-07-08 LAB — BASIC METABOLIC PANEL WITH GFR
BUN/Creatinine Ratio: 17 (ref 10–24)
BUN: 25 mg/dL (ref 8–27)
CO2: 19 mmol/L — ABNORMAL LOW (ref 20–29)
Calcium: 9.1 mg/dL (ref 8.6–10.2)
Chloride: 104 mmol/L (ref 96–106)
Creatinine, Ser: 1.47 mg/dL — ABNORMAL HIGH (ref 0.76–1.27)
Glucose: 160 mg/dL — ABNORMAL HIGH (ref 70–99)
Potassium: 4.9 mmol/L (ref 3.5–5.2)
Sodium: 140 mmol/L (ref 134–144)
eGFR: 52 mL/min/1.73 — ABNORMAL LOW

## 2024-08-11 ENCOUNTER — Encounter (INDEPENDENT_AMBULATORY_CARE_PROVIDER_SITE_OTHER)

## 2024-08-11 ENCOUNTER — Ambulatory Visit (INDEPENDENT_AMBULATORY_CARE_PROVIDER_SITE_OTHER): Payer: PRIVATE HEALTH INSURANCE | Admitting: Vascular Surgery
